# Patient Record
Sex: Female | Born: 1960
Health system: Southern US, Community
[De-identification: ages and names within clinical notes are randomized; demographics above are authoritative.]

## PROBLEM LIST (undated history)

## (undated) ENCOUNTER — Emergency Department (HOSPITAL_COMMUNITY): Admission: EM | Payer: 59 | Source: Home / Self Care

## (undated) ENCOUNTER — Emergency Department (HOSPITAL_COMMUNITY): Admission: EM | Payer: Medicare Other | Source: Home / Self Care

## (undated) DIAGNOSIS — K52832 Lymphocytic colitis: Secondary | ICD-10-CM

## (undated) DIAGNOSIS — M3500 Sicca syndrome, unspecified: Secondary | ICD-10-CM

## (undated) DIAGNOSIS — I493 Ventricular premature depolarization: Secondary | ICD-10-CM

## (undated) DIAGNOSIS — F32A Depression, unspecified: Secondary | ICD-10-CM

## (undated) DIAGNOSIS — F419 Anxiety disorder, unspecified: Secondary | ICD-10-CM

## (undated) DIAGNOSIS — H269 Unspecified cataract: Secondary | ICD-10-CM

## (undated) DIAGNOSIS — K219 Gastro-esophageal reflux disease without esophagitis: Secondary | ICD-10-CM

## (undated) DIAGNOSIS — R42 Dizziness and giddiness: Secondary | ICD-10-CM

## (undated) DIAGNOSIS — M1990 Unspecified osteoarthritis, unspecified site: Secondary | ICD-10-CM

## (undated) DIAGNOSIS — R112 Nausea with vomiting, unspecified: Secondary | ICD-10-CM

## (undated) DIAGNOSIS — K589 Irritable bowel syndrome without diarrhea: Secondary | ICD-10-CM

## (undated) DIAGNOSIS — K648 Other hemorrhoids: Secondary | ICD-10-CM

## (undated) DIAGNOSIS — F329 Major depressive disorder, single episode, unspecified: Secondary | ICD-10-CM

## (undated) DIAGNOSIS — D649 Anemia, unspecified: Secondary | ICD-10-CM

## (undated) DIAGNOSIS — E039 Hypothyroidism, unspecified: Secondary | ICD-10-CM

## (undated) DIAGNOSIS — M797 Fibromyalgia: Secondary | ICD-10-CM

## (undated) DIAGNOSIS — G2581 Restless legs syndrome: Secondary | ICD-10-CM

## (undated) DIAGNOSIS — J301 Allergic rhinitis due to pollen: Secondary | ICD-10-CM

## (undated) DIAGNOSIS — Z9889 Other specified postprocedural states: Secondary | ICD-10-CM

## (undated) DIAGNOSIS — I428 Other cardiomyopathies: Secondary | ICD-10-CM

## (undated) DIAGNOSIS — R5382 Chronic fatigue, unspecified: Secondary | ICD-10-CM

## (undated) DIAGNOSIS — I5042 Chronic combined systolic (congestive) and diastolic (congestive) heart failure: Secondary | ICD-10-CM

## (undated) DIAGNOSIS — Z8271 Family history of polycystic kidney: Secondary | ICD-10-CM

## (undated) DIAGNOSIS — I10 Essential (primary) hypertension: Secondary | ICD-10-CM

## (undated) DIAGNOSIS — W19XXXA Unspecified fall, initial encounter: Secondary | ICD-10-CM

## (undated) HISTORY — DX: Morbid (severe) obesity due to excess calories: E66.01

## (undated) HISTORY — DX: Unspecified cataract: H26.9

## (undated) HISTORY — DX: Restless legs syndrome: G25.81

## (undated) HISTORY — DX: Irritable bowel syndrome, unspecified: K58.9

## (undated) HISTORY — DX: Ventricular premature depolarization: I49.3

## (undated) HISTORY — DX: Other cardiomyopathies: I42.8

## (undated) HISTORY — DX: Major depressive disorder, single episode, unspecified: F32.9

## (undated) HISTORY — DX: Gastro-esophageal reflux disease without esophagitis: K21.9

## (undated) HISTORY — DX: Unspecified fall, initial encounter: W19.XXXA

## (undated) HISTORY — DX: Allergic rhinitis due to pollen: J30.1

## (undated) HISTORY — DX: Anemia, unspecified: D64.9

## (undated) HISTORY — DX: Family history of polycystic kidney: Z82.71

## (undated) HISTORY — PX: ABDOMINAL HYSTERECTOMY: SHX81

## (undated) HISTORY — DX: Chronic combined systolic (congestive) and diastolic (congestive) heart failure: I50.42

## (undated) HISTORY — DX: Essential (primary) hypertension: I10

## (undated) HISTORY — DX: Unspecified osteoarthritis, unspecified site: M19.90

## (undated) HISTORY — DX: Sjogren syndrome, unspecified: M35.00

## (undated) HISTORY — DX: Sicca syndrome, unspecified: M35.00

## (undated) HISTORY — DX: Nausea with vomiting, unspecified: Z98.890

## (undated) HISTORY — PX: EYE SURGERY: SHX253

## (undated) HISTORY — DX: Anxiety disorder, unspecified: F41.9

## (undated) HISTORY — DX: Nausea with vomiting, unspecified: R11.2

## (undated) HISTORY — DX: Hypothyroidism, unspecified: E03.9

## (undated) HISTORY — PX: ESOPHAGOGASTRODUODENOSCOPY: SHX1529

## (undated) HISTORY — DX: Other hemorrhoids: K64.8

## (undated) HISTORY — DX: Dizziness and giddiness: R42

## (undated) HISTORY — DX: Lymphocytic colitis: K52.832

## (undated) HISTORY — DX: Chronic fatigue, unspecified: R53.82

## (undated) HISTORY — PX: ABDOMINAL HERNIA REPAIR: SHX539

## (undated) HISTORY — PX: SPINE SURGERY: SHX786

## (undated) HISTORY — PX: SHOULDER ARTHROSCOPY W/ SUBACROMIAL DECOMPRESSION AND DISTAL CLAVICLE EXCISION: SHX2401

## (undated) HISTORY — DX: Fibromyalgia: M79.7

## (undated) HISTORY — DX: Depression, unspecified: F32.A

---

## 1990-05-06 HISTORY — PX: TUBAL LIGATION: SHX77

## 1995-05-07 DIAGNOSIS — R87619 Unspecified abnormal cytological findings in specimens from cervix uteri: Secondary | ICD-10-CM | POA: Insufficient documentation

## 1996-06-15 ENCOUNTER — Encounter: Payer: Self-pay | Admitting: Family Medicine

## 1997-12-07 ENCOUNTER — Inpatient Hospital Stay (HOSPITAL_COMMUNITY): Admission: EM | Admit: 1997-12-07 | Discharge: 1997-12-08 | Payer: Self-pay | Admitting: *Deleted

## 2000-02-05 ENCOUNTER — Encounter: Admission: RE | Admit: 2000-02-05 | Discharge: 2000-02-05 | Payer: Self-pay | Admitting: Obstetrics and Gynecology

## 2000-02-05 ENCOUNTER — Encounter: Payer: Self-pay | Admitting: Obstetrics and Gynecology

## 2000-02-06 ENCOUNTER — Encounter: Payer: Self-pay | Admitting: Obstetrics and Gynecology

## 2000-02-06 ENCOUNTER — Ambulatory Visit (HOSPITAL_COMMUNITY): Admission: RE | Admit: 2000-02-06 | Discharge: 2000-02-06 | Payer: Self-pay | Admitting: Radiology

## 2000-10-07 ENCOUNTER — Encounter: Payer: Self-pay | Admitting: Obstetrics and Gynecology

## 2000-10-07 ENCOUNTER — Ambulatory Visit (HOSPITAL_COMMUNITY): Admission: RE | Admit: 2000-10-07 | Discharge: 2000-10-07 | Payer: Self-pay | Admitting: Obstetrics and Gynecology

## 2000-10-13 ENCOUNTER — Encounter: Payer: Self-pay | Admitting: Family Medicine

## 2000-10-28 ENCOUNTER — Encounter: Payer: Self-pay | Admitting: Family Medicine

## 2000-11-04 ENCOUNTER — Encounter: Payer: Self-pay | Admitting: Family Medicine

## 2001-01-28 ENCOUNTER — Encounter: Payer: Self-pay | Admitting: Family Medicine

## 2001-03-18 ENCOUNTER — Other Ambulatory Visit: Admission: RE | Admit: 2001-03-18 | Discharge: 2001-03-18 | Payer: Self-pay | Admitting: Obstetrics and Gynecology

## 2003-04-06 ENCOUNTER — Other Ambulatory Visit: Admission: RE | Admit: 2003-04-06 | Discharge: 2003-04-06 | Payer: Self-pay | Admitting: Obstetrics and Gynecology

## 2003-05-24 ENCOUNTER — Encounter: Admission: RE | Admit: 2003-05-24 | Discharge: 2003-05-24 | Payer: Self-pay | Admitting: Obstetrics and Gynecology

## 2003-05-27 ENCOUNTER — Encounter: Admission: RE | Admit: 2003-05-27 | Discharge: 2003-05-27 | Payer: Self-pay | Admitting: Obstetrics and Gynecology

## 2003-08-09 ENCOUNTER — Encounter: Payer: Self-pay | Admitting: Family Medicine

## 2003-10-07 ENCOUNTER — Encounter: Admission: RE | Admit: 2003-10-07 | Discharge: 2003-10-07 | Payer: Self-pay | Admitting: Family Medicine

## 2003-12-13 ENCOUNTER — Encounter: Payer: Self-pay | Admitting: Family Medicine

## 2003-12-30 ENCOUNTER — Encounter: Payer: Self-pay | Admitting: Family Medicine

## 2003-12-30 ENCOUNTER — Ambulatory Visit (HOSPITAL_COMMUNITY): Admission: RE | Admit: 2003-12-30 | Discharge: 2003-12-30 | Payer: Self-pay | Admitting: Internal Medicine

## 2004-05-10 ENCOUNTER — Ambulatory Visit: Payer: Self-pay | Admitting: Internal Medicine

## 2004-06-25 ENCOUNTER — Encounter: Admission: RE | Admit: 2004-06-25 | Discharge: 2004-06-25 | Payer: Self-pay | Admitting: Family Medicine

## 2004-08-02 ENCOUNTER — Encounter: Admission: RE | Admit: 2004-08-02 | Discharge: 2004-08-02 | Payer: Self-pay | Admitting: Allergy and Immunology

## 2004-08-08 ENCOUNTER — Other Ambulatory Visit: Admission: RE | Admit: 2004-08-08 | Discharge: 2004-08-08 | Payer: Self-pay | Admitting: Obstetrics and Gynecology

## 2005-06-15 ENCOUNTER — Emergency Department (HOSPITAL_COMMUNITY): Admission: EM | Admit: 2005-06-15 | Discharge: 2005-06-15 | Payer: Self-pay | Admitting: Family Medicine

## 2005-08-31 ENCOUNTER — Encounter: Payer: Self-pay | Admitting: Family Medicine

## 2005-08-31 ENCOUNTER — Encounter: Admission: RE | Admit: 2005-08-31 | Discharge: 2005-08-31 | Payer: Self-pay | Admitting: Family Medicine

## 2006-01-28 ENCOUNTER — Emergency Department (HOSPITAL_COMMUNITY): Admission: EM | Admit: 2006-01-28 | Discharge: 2006-01-28 | Payer: Self-pay | Admitting: Family Medicine

## 2006-03-12 ENCOUNTER — Other Ambulatory Visit: Admission: RE | Admit: 2006-03-12 | Discharge: 2006-03-12 | Payer: Self-pay | Admitting: Obstetrics and Gynecology

## 2006-03-20 ENCOUNTER — Encounter: Admission: RE | Admit: 2006-03-20 | Discharge: 2006-03-20 | Payer: Self-pay | Admitting: Obstetrics and Gynecology

## 2006-06-06 ENCOUNTER — Ambulatory Visit (HOSPITAL_COMMUNITY): Admission: RE | Admit: 2006-06-06 | Discharge: 2006-06-07 | Payer: Self-pay | Admitting: Obstetrics and Gynecology

## 2006-06-06 ENCOUNTER — Encounter (INDEPENDENT_AMBULATORY_CARE_PROVIDER_SITE_OTHER): Payer: Self-pay | Admitting: Specialist

## 2006-07-21 ENCOUNTER — Encounter: Admission: RE | Admit: 2006-07-21 | Discharge: 2006-07-21 | Payer: Self-pay | Admitting: Family Medicine

## 2006-08-23 ENCOUNTER — Emergency Department (HOSPITAL_COMMUNITY): Admission: EM | Admit: 2006-08-23 | Discharge: 2006-08-24 | Payer: Self-pay | Admitting: Emergency Medicine

## 2006-09-19 ENCOUNTER — Encounter: Payer: Self-pay | Admitting: Family Medicine

## 2006-09-23 ENCOUNTER — Encounter: Admission: RE | Admit: 2006-09-23 | Discharge: 2006-09-23 | Payer: Self-pay | Admitting: Family Medicine

## 2006-09-23 ENCOUNTER — Encounter: Payer: Self-pay | Admitting: Family Medicine

## 2006-10-02 ENCOUNTER — Encounter: Payer: Self-pay | Admitting: Family Medicine

## 2006-10-02 ENCOUNTER — Encounter: Admission: RE | Admit: 2006-10-02 | Discharge: 2006-10-02 | Payer: Self-pay | Admitting: Family Medicine

## 2006-10-14 ENCOUNTER — Encounter: Payer: Self-pay | Admitting: Family Medicine

## 2006-10-29 ENCOUNTER — Encounter: Payer: Self-pay | Admitting: Family Medicine

## 2006-10-31 ENCOUNTER — Encounter: Payer: Self-pay | Admitting: Family Medicine

## 2007-03-24 ENCOUNTER — Encounter: Admission: RE | Admit: 2007-03-24 | Discharge: 2007-03-24 | Payer: Self-pay | Admitting: Family Medicine

## 2007-11-19 ENCOUNTER — Encounter: Payer: Self-pay | Admitting: Family Medicine

## 2007-11-25 ENCOUNTER — Encounter: Payer: Self-pay | Admitting: Family Medicine

## 2008-06-05 ENCOUNTER — Emergency Department (HOSPITAL_COMMUNITY): Admission: EM | Admit: 2008-06-05 | Discharge: 2008-06-05 | Payer: Self-pay | Admitting: Family Medicine

## 2008-08-30 ENCOUNTER — Ambulatory Visit: Payer: Self-pay | Admitting: Family Medicine

## 2008-08-30 DIAGNOSIS — E039 Hypothyroidism, unspecified: Secondary | ICD-10-CM | POA: Insufficient documentation

## 2008-08-30 DIAGNOSIS — G43109 Migraine with aura, not intractable, without status migrainosus: Secondary | ICD-10-CM | POA: Insufficient documentation

## 2008-08-31 DIAGNOSIS — I10 Essential (primary) hypertension: Secondary | ICD-10-CM | POA: Insufficient documentation

## 2008-08-31 DIAGNOSIS — D649 Anemia, unspecified: Secondary | ICD-10-CM | POA: Insufficient documentation

## 2008-08-31 DIAGNOSIS — R519 Headache, unspecified: Secondary | ICD-10-CM | POA: Insufficient documentation

## 2008-08-31 DIAGNOSIS — J309 Allergic rhinitis, unspecified: Secondary | ICD-10-CM | POA: Insufficient documentation

## 2008-08-31 DIAGNOSIS — K219 Gastro-esophageal reflux disease without esophagitis: Secondary | ICD-10-CM | POA: Insufficient documentation

## 2008-08-31 DIAGNOSIS — M1990 Unspecified osteoarthritis, unspecified site: Secondary | ICD-10-CM

## 2008-08-31 DIAGNOSIS — R51 Headache: Secondary | ICD-10-CM | POA: Insufficient documentation

## 2008-08-31 DIAGNOSIS — J45909 Unspecified asthma, uncomplicated: Secondary | ICD-10-CM

## 2008-08-31 DIAGNOSIS — F418 Other specified anxiety disorders: Secondary | ICD-10-CM | POA: Insufficient documentation

## 2008-08-31 DIAGNOSIS — J4540 Moderate persistent asthma, uncomplicated: Secondary | ICD-10-CM | POA: Insufficient documentation

## 2008-09-06 ENCOUNTER — Encounter (INDEPENDENT_AMBULATORY_CARE_PROVIDER_SITE_OTHER): Payer: Self-pay | Admitting: *Deleted

## 2008-09-06 ENCOUNTER — Telehealth: Payer: Self-pay | Admitting: Family Medicine

## 2008-10-01 ENCOUNTER — Other Ambulatory Visit: Payer: Self-pay | Admitting: Emergency Medicine

## 2008-10-01 ENCOUNTER — Emergency Department (HOSPITAL_COMMUNITY): Admission: EM | Admit: 2008-10-01 | Discharge: 2008-10-01 | Payer: Self-pay | Admitting: Emergency Medicine

## 2008-10-04 HISTORY — PX: CHOLECYSTECTOMY: SHX55

## 2008-10-05 ENCOUNTER — Inpatient Hospital Stay (HOSPITAL_COMMUNITY): Admission: RE | Admit: 2008-10-05 | Discharge: 2008-10-06 | Payer: Self-pay | Admitting: General Surgery

## 2008-10-05 ENCOUNTER — Encounter: Payer: Self-pay | Admitting: Family Medicine

## 2008-10-05 ENCOUNTER — Encounter (INDEPENDENT_AMBULATORY_CARE_PROVIDER_SITE_OTHER): Payer: Self-pay | Admitting: General Surgery

## 2008-10-20 ENCOUNTER — Encounter: Payer: Self-pay | Admitting: Family Medicine

## 2008-11-15 ENCOUNTER — Ambulatory Visit: Payer: Self-pay | Admitting: Family Medicine

## 2008-11-15 DIAGNOSIS — E785 Hyperlipidemia, unspecified: Secondary | ICD-10-CM

## 2008-11-15 LAB — CONVERTED CEMR LAB
ALT: 18 units/L (ref 0–35)
AST: 19 units/L (ref 0–37)
Albumin: 3.3 g/dL — ABNORMAL LOW (ref 3.5–5.2)
Alkaline Phosphatase: 70 units/L (ref 39–117)
BUN: 7 mg/dL (ref 6–23)
Basophils Absolute: 0.1 10*3/uL (ref 0.0–0.1)
Basophils Relative: 1.1 % (ref 0.0–3.0)
Bilirubin, Direct: 0.1 mg/dL (ref 0.0–0.3)
CO2: 25 meq/L (ref 19–32)
Calcium: 8.4 mg/dL (ref 8.4–10.5)
Chloride: 109 meq/L (ref 96–112)
Cholesterol: 156 mg/dL (ref 0–200)
Creatinine, Ser: 0.7 mg/dL (ref 0.4–1.2)
Eosinophils Absolute: 0.4 10*3/uL (ref 0.0–0.7)
Eosinophils Relative: 5.9 % — ABNORMAL HIGH (ref 0.0–5.0)
Free T4: 0.8 ng/dL (ref 0.6–1.6)
GFR calc non Af Amer: 94.63 mL/min (ref >60)
Glucose, Bld: 102 mg/dL — ABNORMAL HIGH (ref 70–99)
HCT: 34.3 % — ABNORMAL LOW (ref 36.0–46.0)
HDL: 41.3 mg/dL (ref >39.00)
Hemoglobin: 11.8 g/dL — ABNORMAL LOW (ref 12.0–15.0)
LDL Cholesterol: 99 mg/dL (ref 0–99)
Lymphocytes Relative: 27.2 % (ref 12.0–46.0)
Lymphs Abs: 1.9 10*3/uL (ref 0.7–4.0)
MCHC: 34.3 g/dL (ref 30.0–36.0)
MCV: 90.1 fL (ref 78.0–100.0)
Monocytes Absolute: 0.3 10*3/uL (ref 0.1–1.0)
Monocytes Relative: 4.9 % (ref 3.0–12.0)
Neutro Abs: 4.3 10*3/uL (ref 1.4–7.7)
Neutrophils Relative %: 60.9 % (ref 43.0–77.0)
Platelets: 224 10*3/uL (ref 150.0–400.0)
Potassium: 4.1 meq/L (ref 3.5–5.1)
RBC: 3.81 M/uL — ABNORMAL LOW (ref 3.87–5.11)
RDW: 13.2 % (ref 11.5–14.6)
Sodium: 139 meq/L (ref 135–145)
TSH: 3.29 microintl units/mL (ref 0.35–5.50)
Total Bilirubin: 0.8 mg/dL (ref 0.3–1.2)
Total CHOL/HDL Ratio: 4
Total Protein: 6.3 g/dL (ref 6.0–8.3)
Triglycerides: 79 mg/dL (ref 0.0–149.0)
VLDL: 15.8 mg/dL (ref 0.0–40.0)
WBC: 7 10*3/uL (ref 4.5–10.5)

## 2008-11-17 ENCOUNTER — Ambulatory Visit: Payer: Self-pay | Admitting: Family Medicine

## 2008-11-21 ENCOUNTER — Encounter (INDEPENDENT_AMBULATORY_CARE_PROVIDER_SITE_OTHER): Payer: Self-pay | Admitting: *Deleted

## 2008-11-21 LAB — CONVERTED CEMR LAB
Ferritin: 25.1 ng/mL (ref 10.0–291.0)
Folate: 8.6 ng/mL
Iron: 41 ug/dL — ABNORMAL LOW (ref 42–145)
Saturation Ratios: 14.1 % — ABNORMAL LOW (ref 20.0–50.0)
Transferrin: 207.7 mg/dL — ABNORMAL LOW (ref 212.0–360.0)
Vitamin B-12: 223 pg/mL (ref 211–911)

## 2008-12-21 ENCOUNTER — Encounter: Admission: RE | Admit: 2008-12-21 | Discharge: 2008-12-21 | Payer: Self-pay | Admitting: Family Medicine

## 2008-12-21 LAB — HM MAMMOGRAPHY

## 2008-12-23 ENCOUNTER — Encounter (INDEPENDENT_AMBULATORY_CARE_PROVIDER_SITE_OTHER): Payer: Self-pay | Admitting: *Deleted

## 2009-04-03 ENCOUNTER — Ambulatory Visit: Payer: Self-pay | Admitting: Family Medicine

## 2009-05-15 ENCOUNTER — Ambulatory Visit: Payer: Self-pay | Admitting: Family Medicine

## 2009-05-19 ENCOUNTER — Ambulatory Visit: Payer: Self-pay | Admitting: Family Medicine

## 2009-05-22 ENCOUNTER — Ambulatory Visit: Payer: Self-pay | Admitting: Family Medicine

## 2009-05-22 DIAGNOSIS — R5383 Other fatigue: Secondary | ICD-10-CM

## 2009-05-22 DIAGNOSIS — R5381 Other malaise: Secondary | ICD-10-CM | POA: Insufficient documentation

## 2009-05-22 LAB — CONVERTED CEMR LAB
Basophils Absolute: 0 10*3/uL (ref 0.0–0.1)
Basophils Relative: 0.4 % (ref 0.0–3.0)
Eosinophils Absolute: 0.5 10*3/uL (ref 0.0–0.7)
Eosinophils Relative: 6.2 % — ABNORMAL HIGH (ref 0.0–5.0)
HCT: 41 % (ref 36.0–46.0)
Hemoglobin: 13.5 g/dL (ref 12.0–15.0)
Lymphocytes Relative: 32.4 % (ref 12.0–46.0)
Lymphs Abs: 2.4 10*3/uL (ref 0.7–4.0)
MCHC: 32.8 g/dL (ref 30.0–36.0)
MCV: 92.9 fL (ref 78.0–100.0)
Monocytes Absolute: 0.3 10*3/uL (ref 0.1–1.0)
Monocytes Relative: 4.4 % (ref 3.0–12.0)
Neutro Abs: 4.3 10*3/uL (ref 1.4–7.7)
Neutrophils Relative %: 56.6 % (ref 43.0–77.0)
Platelets: 269 10*3/uL (ref 150.0–400.0)
RBC: 4.42 M/uL (ref 3.87–5.11)
RDW: 12.7 % (ref 11.5–14.6)
TSH: 4.79 microintl units/mL (ref 0.35–5.50)
WBC: 7.5 10*3/uL (ref 4.5–10.5)

## 2009-05-23 LAB — CONVERTED CEMR LAB: Vit D, 25-Hydroxy: 24 ng/mL — ABNORMAL LOW (ref 30–89)

## 2009-07-24 ENCOUNTER — Ambulatory Visit: Payer: Self-pay | Admitting: Family Medicine

## 2009-10-09 ENCOUNTER — Ambulatory Visit: Payer: Self-pay | Admitting: Family Medicine

## 2009-10-09 DIAGNOSIS — E559 Vitamin D deficiency, unspecified: Secondary | ICD-10-CM | POA: Insufficient documentation

## 2009-10-10 LAB — CONVERTED CEMR LAB
ALT: 18 units/L (ref 0–35)
AST: 16 units/L (ref 0–37)
Albumin: 3.7 g/dL (ref 3.5–5.2)
Alkaline Phosphatase: 84 units/L (ref 39–117)
BUN: 15 mg/dL (ref 6–23)
Basophils Absolute: 0.1 10*3/uL (ref 0.0–0.1)
Basophils Relative: 0.8 % (ref 0.0–3.0)
Bilirubin, Direct: 0.1 mg/dL (ref 0.0–0.3)
CO2: 28 meq/L (ref 19–32)
Calcium: 8.8 mg/dL (ref 8.4–10.5)
Chloride: 106 meq/L (ref 96–112)
Cholesterol: 161 mg/dL (ref 0–200)
Creatinine, Ser: 0.8 mg/dL (ref 0.4–1.2)
Eosinophils Absolute: 0.4 10*3/uL (ref 0.0–0.7)
Eosinophils Relative: 4.7 % (ref 0.0–5.0)
GFR calc non Af Amer: 82 mL/min (ref >60)
Glucose, Bld: 97 mg/dL (ref 70–99)
HCT: 38.4 % (ref 36.0–46.0)
HDL: 41.9 mg/dL (ref >39.00)
Hemoglobin: 13.2 g/dL (ref 12.0–15.0)
LDL Cholesterol: 89 mg/dL (ref 0–99)
Lymphocytes Relative: 30.9 % (ref 12.0–46.0)
Lymphs Abs: 2.5 10*3/uL (ref 0.7–4.0)
MCHC: 34.3 g/dL (ref 30.0–36.0)
MCV: 91 fL (ref 78.0–100.0)
Monocytes Absolute: 0.4 10*3/uL (ref 0.1–1.0)
Monocytes Relative: 4.9 % (ref 3.0–12.0)
Neutro Abs: 4.7 10*3/uL (ref 1.4–7.7)
Neutrophils Relative %: 58.7 % (ref 43.0–77.0)
Platelets: 255 10*3/uL (ref 150.0–400.0)
Potassium: 4.1 meq/L (ref 3.5–5.1)
RBC: 4.22 M/uL (ref 3.87–5.11)
RDW: 12.9 % (ref 11.5–14.6)
Sodium: 141 meq/L (ref 135–145)
TSH: 4.11 microintl units/mL (ref 0.35–5.50)
Total Bilirubin: 0.6 mg/dL (ref 0.3–1.2)
Total CHOL/HDL Ratio: 4
Total Protein: 6.4 g/dL (ref 6.0–8.3)
Triglycerides: 153 mg/dL — ABNORMAL HIGH (ref 0.0–149.0)
VLDL: 30.6 mg/dL (ref 0.0–40.0)
Vit D, 25-Hydroxy: 25 ng/mL — ABNORMAL LOW (ref 30–89)
WBC: 8 10*3/uL (ref 4.5–10.5)

## 2009-11-22 ENCOUNTER — Telehealth: Payer: Self-pay | Admitting: Family Medicine

## 2009-11-23 ENCOUNTER — Encounter: Payer: Self-pay | Admitting: Family Medicine

## 2010-01-29 ENCOUNTER — Telehealth: Payer: Self-pay | Admitting: Family Medicine

## 2010-05-23 ENCOUNTER — Telehealth: Payer: Self-pay | Admitting: Family Medicine

## 2010-05-26 ENCOUNTER — Encounter: Payer: Self-pay | Admitting: Obstetrics and Gynecology

## 2010-06-05 NOTE — Assessment & Plan Note (Signed)
Summary: follow up   Vital Signs:  Patient profile:   50 year old female Height:      63 inches Weight:      218.0 pounds BMI:     38.76 Temp:     98.3 degrees F oral Pulse rate:   80 / minute Pulse rhythm:   regular BP sitting:   110 / 70  (left arm) Cuff size:   large  Vitals Entered By: Benny Lennert CMA Duncan Dull) (October 09, 2009 8:20 AM)  History of Present Illness: Chief complaint follow up  HTN: stable, no c/o  Thyroid: stable, no signs   Vit D def: has d/c her vits  GERD: needs to change from protonix, signs well controlled    Current Problems (verified): 1)  Unspecified Vitamin D Deficiency  (ICD-268.9) 2)  Screening For Lipoid Disorders  (ICD-V77.91) 3)  Encounter For Long-term Use of Other Medications  (ICD-V58.69) 4)  Fatigue  (ICD-780.79) 5)  Other and Unspecified Hyperlipidemia  (ICD-272.4) 6)  Migraine Headache  (ICD-346.90) 7)  Fibromyalgia  (ICD-729.1) 8)  Osteoarthritis  (ICD-715.90) 9)  Hypertension  (ICD-401.9) 10)  Headache  (ICD-784.0) 11)  Gerd  (ICD-530.81) 12)  Depression  (ICD-311) 13)  Asthma  (ICD-493.90) 14)  Anemia-nos  (ICD-285.9) 15)  Allergic Rhinitis  (ICD-477.9) 16)  Hypothyroidism  (ICD-244.9)  Allergies: 1)  ! Sulfa 2)  ! Codeine  Past History:  Past medical, surgical, family and social histories (including risk factors) reviewed, and no changes noted (except as noted below).  Past Medical History: Reviewed history from 12/05/2008 and no changes required. Allergic rhinitis Anemia-NOS FIBROMYALGIA Cardiomyopathy, nonischemic, dx 2000, 40% EF by echo then Asthma Depression GERD Headache Hypertension Hypothyroidism Osteoarthritis Restless Legs Syndrome Chronic Vertigo IBS? Cholecystitis, 10/2008  Former Dr. Andrey Campanile then Dr. Artis Flock patient Dr. Larey Dresser, Podiatrist Ortho = Yisroel Ramming, [place] [facility] Cards = Peter [place] Urologist = Dr. Patsi Sears  Past Surgical History: Reviewed history from 12/05/2008 and  no changes required. Hysterectomy, 06/2006, Bleeding / Anemia, no CA Caesarean section, 1984, 1991 Tubal ligation, 1992 Rotator Cuff, R, ? repair vs. SAD/DCE? Pt, not sure [facility]) Cholecystectomy, 10/2008  EGD, 2005, Gessner, stricture in distal esophagus  Family History: Reviewed history from 08/30/2008 and no changes required. Alcoholism, Drug Addiction: P, GP, others Colon CA: 0 Ovarian/Uterine CA: 0 Breast CA: 0 Lung CA: GP Prostate CA: 0 CAD: 0 CVA: 0 Sudden death < 50: 0 DM: 0 Mental Illness: P, GP Pancreatic CA = P  Family History Diabetes 1st degree relative Family History High cholesterol Family History Hypertension  Social History: Reviewed history from 08/30/2008 and no changes required. 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 CV: No chest pain or SOB GI: No noted N or V Otherwise, pertinent positives and negatives are noted in the HPI.   Physical Exam  General:  GEN: WDWN, NAD, Non-toxic, A & O x 3 HEENT: Atraumatic, Normocephalic. Neck supple. No masses, No LAD. Ears and Nose: No external deformity. CV: RRR, No M/G/R. No JVD. No thrill. No extra heart sounds. PULM: CTA B, no wheezes, crackles, rhonchi. No retractions. No resp. distress. No accessory muscle use. EXTR: No c/c/e NEURO: Normal gait.  PSYCH: Normally interactive. Conversant. Not depressed or anxious appearing.  Calm demeanor.     Impression & Recommendations:  Problem #  1:  HYPERTENSION (ICD-401.9) Assessment Unchanged  The following medications were removed from the medication list:    Diovan Hct 80-12.5 Mg Tabs (Valsartan-hydrochlorothiazide) .Marland Kitchen... 1 by mouth daily Her updated medication list for this problem includes:    Losartan Potassium-hctz 50-12.5 Mg Tabs (Losartan potassium-hctz) .Marland Kitchen... 1 by  mouth daily  Problem # 2:  HYPOTHYROIDISM (ICD-244.9) Assessment: Unchanged  Her updated medication list for this problem includes:    Levothroid 50 Mcg Tabs (Levothyroxine sodium) ..... Once daily by mouth  Orders: TLB-TSH (Thyroid Stimulating Hormone) (84443-TSH)  Problem # 3:  GERD (ICD-530.81) Assessment: Unchanged change for ins to prilosec  Her updated medication list for this problem includes:    Omeprazole 20 Mg Cpdr (Omeprazole) .Marland Kitchen... 1 by mouth daily  Complete Medication List: 1)  Omeprazole 20 Mg Cpdr (Omeprazole) .Marland Kitchen.. 1 by mouth daily 2)  Bupropion Hcl 300 Mg Xr24h-tab (Bupropion hcl) .Marland Kitchen.. 1 by mouth daily 3)  Levothroid 50 Mcg Tabs (Levothyroxine sodium) .... Once daily by mouth 4)  Lyrica 75 Mg Caps (Pregabalin) .... Take one capsule three times a day by mouth as needed 5)  Cyclobenzaprine Hcl 5 Mg Tabs (Cyclobenzaprine hcl) .... As needed 6)  Meloxicam 7.5 Mg Tabs (Meloxicam) .... As needed 7)  Ibuprofen 800 Mg Tabs (Ibuprofen) 8)  Multivitamins Tabs (Multiple vitamin) .... One a day by mouth 9)  Sumatriptan Succinate 50 Mg Tabs (Sumatriptan succinate) .Marland Kitchen.. 1 by mouth as needed headache, may repeat after 2 hours 10)  Losartan Potassium-hctz 50-12.5 Mg Tabs (Losartan potassium-hctz) .Marland Kitchen.. 1 by mouth daily  Other Orders: Venipuncture (14782) TLB-Lipid Panel (80061-LIPID) TLB-BMP (Basic Metabolic Panel-BMET) (80048-METABOL) TLB-CBC Platelet - w/Differential (85025-CBCD) TLB-Hepatic/Liver Function Pnl (80076-HEPATIC) T-Vitamin D (25-Hydroxy) (95621-30865) Prescriptions: OMEPRAZOLE 20 MG CPDR (OMEPRAZOLE) 1 by mouth daily  #30 x 11   Entered and Authorized by:   [name] MD   Signed by:   [name] MD on 10/09/2009   Method used:   Print then Give to Patient   RxID:   [id number] LOSARTAN POTASSIUM-HCTZ 50-12.5 MG TABS (LOSARTAN POTASSIUM-HCTZ) 1 by mouth daily  #30 x 11   Entered and Authorized by:   [name] MD   Signed by:   [name] MD on 10/09/2009   Method used:   Print then Give to Patient   RxID:   [id number]   Current Allergies (reviewed today): ! SULFA ! CODEINE  Appended Document: follow up

## 2010-06-05 NOTE — Letter (Signed)
Summary: Out of Work  [facility] [employer] [facility] at [facility]  [address] [place], [place] [address]   Phone: [phone]  Fax: [phone]    July 24, 2009   Employee:  LAILANA SHIRA [facility] [employer]    To Whom It May Concern:   For Medical reasons, please excuse the above named employee from work for the following dates:  Start:   07/22/2009  End:   07/28/2009  If you need additional information, please feel free to contact our office.         Sincerely,    [name] MD

## 2010-06-05 NOTE — Assessment & Plan Note (Signed)
Summary: NEW PT TO EST/CLE   Vital Signs:  Patient profile:   50 year old female Height:      63 inches Weight:      237.0 pounds BMI:     42.13 Temp:     98.1 degrees F oral Pulse rate:   72 / minute Pulse rhythm:   regular BP sitting:   140 / 90  (left arm) Cuff size:   regular  Vitals Entered By: Benny Lennert CMA (August 30, 2008 2:05 PM)  History of Present Illness: Chief complaint new patient to be established   50 year old female patient, former patient of Dr. Artis Flock who presents to establish in our office:  Was a former patient of Dr. Andrey Campanile, then Dr. Artis Flock.   Having some hacking cough.   Dep: Took some Cymbalta, went to the [facility], placed on Cymbalta. Mentally it was very good but felt very removed and had a paranoid sensation and sweat prfusely. Now she does not feel like she is very stable and maintained on her wellbutrin.  Cardiologist is Dr. Peter Swaziland - having some arrythmias. Every once in a while will have some palpitations. Racing - will have to review Dr. Illa Level notes. Was on 2 medicines for some time. The patient is not clear about a diagnosis or what she has been on - will review Dr. Elvis Coil notes.  HTN: 140/90 today, requests a combination diovan-HCTZ combo pill. Has been tolerating this well without difficulty.  Fibro, stopped meds unless hurting so bad unless she can't stand it.  Is currently only using her Lyrica as needed.  h/o GERD, stable on Protonix  h/o hypothyroidism, stable on replacement.  The patient has some sort of cardiomyopathy vs. CHF? She is unclear and will have to review records.  h/o migraine headaches, relatively controlled now - no recent headaches.   Preventive Screening-Counseling & Management     Does Patient Exercise: no      Drug Use:  no.    Allergies (verified): 1)  ! Sulfa 2)  ! Codeine  Past History:  Past Medical History:    Allergic rhinitis    Anemia-NOS    FIBROMYALGIA    Cardiomyopathy?   Asthma    Depression    GERD    Headache    Hypertension    Hypothyroidism    Osteoarthritis    Restless Legs Syndrome    Chronic Vertigo    IBS?        Former Dr. Andrey Campanile then Dr. 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, Podiatrist    Ortho = Yisroel Ramming, [place] [facility]    Cards = Peter [place]    Urologist = Dr. Patsi Sears  Past Surgical History:    Hysterectomy, 06/2006, Bleeding / Anemia, no CA    Caesarean section, 1984, 1991    Tubal ligation, 1992    Rotator Cuff, R, ? repair vs. SAD/DCE? Pt, not sure Yisroel Ramming)  Family History:    Alcoholism, Drug Addiction: P, GP, others    Colon CA: 0    Ovarian/Uterine CA: 0    Breast CA: 0    Lung CA: GP    Prostate CA: 0    CAD: 0    CVA: 0    Sudden death < 50: 0    DM: 0    Mental Illness: P, GP    Pancreatic CA = P        Family History Diabetes 1st degree relative  Family History High cholesterol    Family History Hypertension  Social History:    Occupation: Biomedical scientist w/ some college    Former smoker, quit  ~1990    Alcohol use-no    Drug use-no    Regular exercise-no    Drug Use:  no    Does Patient Exercise:  no  Review of Systems      See HPI       Otherwise, the pertinent positives and negatives are listed above and in the HPI, otherwise a full review of systems has been reviewed and is negative unless noted positive.  General:  Complains of fatigue, malaise, sleep disorder, and weakness; denies chills, fever, loss of appetite, and sweats. CV:  Complains of fatigue and palpitations; denies chest pain or discomfort and fainting. Resp:  Complains of cough; denies coughing up blood, excessive snoring, shortness of breath, sputum productive, and wheezing. GI:  Denies indigestion, loss of appetite, nausea, and vomiting. MS:  Complains of joint pain, loss of strength, low back pain, mid back pain, muscle aches, muscle, cramps, muscle weakness, stiffness, and thoracic pain; denies joint redness and  joint swelling. Neuro:  Denies numbness and tingling. Psych:  Complains of anxiety, depression, easily tearful, irritability, mental problems, and panic attacks; denies alternate hallucination ( auditory/visual), easily angered, sense of great danger, suicidal thoughts/plans, thoughts of violence, unusual visions or sounds, and thoughts /plans of harming others. Endo:  Denies cold intolerance, excessive hunger, excessive thirst, excessive urination, heat intolerance, and polyuria.  Physical Exam  General:  alert, well-developed, well-hydrated, appropriate dress, normal appearance, cooperative to examination, good hygiene, overweight-appearing, and pale.   Head:  normocephalic, atraumatic, no abnormalities observed, no abnormalities palpated, and no alopecia.   Eyes:  vision grossly intact, pupils equal, pupils round, pupils reactive to light, pupils react to accomodation, corneas and lenses clear, and no injection.   Ears:  External ear exam shows no significant lesions or deformities.  Otoscopic examination reveals clear canals, tympanic membranes are intact bilaterally without bulging, retraction, inflammation or discharge. Hearing is grossly normal bilaterally. Nose:  no external deformity.   Mouth:  Oral mucosa and oropharynx without lesions or exudates.  Teeth in good repair. Neck:  No deformities, masses, or tenderness noted. Lungs:  Normal respiratory effort, chest expands symmetrically. Lungs are clear to auscultation, no crackles or wheezes. Heart:  Normal rate and regular rhythm. S1 and S2 normal without gallop, murmur, click, rub or other extra sounds. Abdomen:  Bowel sounds positive,abdomen soft and non-tender without masses, organomegaly or hernias noted. Msk:  normal ROM and no redness over joints.   Extremities:  No clubbing, cyanosis, edema, or deformity noted with normal full range of motion of all joints.   Neurologic:  alert & oriented X3, sensation intact to light touch, and  gait normal.   Skin:  Intact without suspicious lesions or rashes Cervical Nodes:  No lymphadenopathy noted Psych:  Oriented X3, memory intact for recent and remote, normally interactive, good eye contact, not agitated, depressed affect, tearful, and slightly anxious.     Impression & Recommendations:  Problem # 1:  HYPERTENSION (ICD-401.9) Assessment New Anxious and distressed today - may have to increase at follow-up  The following medications were removed from the medication list:    Hydrochlorothiazide 12.5 Mg Caps (Hydrochlorothiazide) ..... Once daily    Diovan Hct 80-12.5 Mg Tabs (Valsartan-hydrochlorothiazide) ..... Once daily Her updated medication list for this problem includes:    Diovan Hct  80-12.5 Mg Tabs (Valsartan-hydrochlorothiazide) .Marland Kitchen... 1 by mouth daily  Problem # 2:  DEPRESSION (ICD-311) Assessment: New Poorly controlled - will need to explore further.  at the time of this note, I have already seen her husband who discussed some of his concerns to me, he does not think she is well controlled from a depression standpoint.  Her updated medication list for this problem includes:    Bupropion Hcl 150 Mg Xr12h-tab (Bupropion hcl) ..... Once daily  Problem # 3:  GERD (ICD-530.81) Assessment: New stable  Her updated medication list for this problem includes:    Pantoprazole Sodium 40 Mg Tbec (Pantoprazole sodium) ..... Once daily  Problem # 4:  HYPOTHYROIDISM (ICD-244.9) stable  Her updated medication list for this problem includes:    Levothroid 50 Mcg Tabs (Levothyroxine sodium) ..... Once daily  Problem # 5:  FIBROMYALGIA (ICD-729.1) Assessment: New Increase Lyrica dosing.  Her updated medication list for this problem includes:    Cyclobenzaprine Hcl 5 Mg Tabs (Cyclobenzaprine hcl) .Marland Kitchen... As needed    Meloxicam 7.5 Mg Tabs (Meloxicam) .Marland Kitchen... As needed    Ibuprofen 800 Mg Tabs (Ibuprofen)  Problem # 6:  MIGRAINE HEADACHE (ICD-346.90) Assessment:  New Given emergency Imitrex.  Her updated medication list for this problem includes:    Meloxicam 7.5 Mg Tabs (Meloxicam) .Marland Kitchen... As needed    Ibuprofen 800 Mg Tabs (Ibuprofen)    Sumatriptan Succinate 50 Mg Tabs (Sumatriptan succinate) .Marland Kitchen... 1 by mouth as needed headache, may repeat after 2 hours  Complete Medication List: 1)  Pantoprazole Sodium 40 Mg Tbec (Pantoprazole sodium) .... Once daily 2)  Bupropion Hcl 150 Mg Xr12h-tab (Bupropion hcl) .... Once daily 3)  Levothroid 50 Mcg Tabs (Levothyroxine sodium) .... Once daily 4)  Lyrica 75 Mg Caps (Pregabalin) .... As needed 5)  Cyclobenzaprine Hcl 5 Mg Tabs (Cyclobenzaprine hcl) .... As needed 6)  Meloxicam 7.5 Mg Tabs (Meloxicam) .... As needed 7)  Ibuprofen 800 Mg Tabs (Ibuprofen) 8)  Multivitamins Tabs (Multiple vitamin) .... One a day 9)  Diovan Hct 80-12.5 Mg Tabs (Valsartan-hydrochlorothiazide) .Marland Kitchen.. 1 by mouth daily 10)  Sumatriptan Succinate 50 Mg Tabs (Sumatriptan succinate) .Marland Kitchen.. 1 by mouth as needed headache, may repeat after 2 hours  Patient Instructions: 1)  follow-up in July Prescriptions: SUMATRIPTAN SUCCINATE 50 MG TABS (SUMATRIPTAN SUCCINATE) 1 by mouth as needed headache, may repeat after 2 hours  #10 x 11   Entered and Authorized by:   [name] MD   Signed by:   [name] MD on 08/30/2008   Method used:   Print then Give to Patient   RxID:   [id number] DIOVAN HCT 80-12.5 MG TABS (VALSARTAN-HYDROCHLOROTHIAZIDE) 1 by mouth daily  #30 x 11   Entered and Authorized by:   [name] MD   Signed by:   [name] MD on 08/30/2008   Method used:   Print then Give to Patient   RxID:   [id number]       Current Allergies (reviewed today): ! SULFA ! CODEINE

## 2010-06-05 NOTE — Assessment & Plan Note (Signed)
Summary: FOLLOW UP / LFW   Vital Signs:  Patient profile:   50 year old female Height:      63 inches Weight:      222.6 pounds BMI:     39.57 Temp:     98.2 degrees F oral Pulse rate:   80 / minute Pulse rhythm:   regular BP sitting:   120 / 78  (left arm) Cuff size:   large  Vitals Entered By: Benny Lennert CMA Duncan Dull) (May 22, 2009 8:09 AM)  History of Present Illness: Chief complaint 6 month follow up  f/u  thyroid: hair was falling out some in october. used a natural fiber hair brush. a little more.   iron def anemia - has not been able to take iron pills. David constipating for her, and she has not been able to tolerate taking  BP stable: Compliant with medications  uri 2 weeks was working twelve hours through the wekeend each eday she is finished taking her azithromycin, covered for potential bacterial infection, and is resolving at this point.  vit d: diffuse aches and pains and arthralgias and history of fibromyalgia, no recent vitamin D checked  Review of systems as above. No fever, chills, sweats. No nausea, vomiting, diarrhea. No chest pain shortness of breath  GEN: WDWN, NAD, Non-toxic, A & O x 3 HEENT: Atraumatic, Normocephalic. Neck supple. No masses, No LAD. Ears and Nose: No external deformity. CV: RRR, No M/G/R. No JVD. No thrill. No extra heart sounds. PULM: CTA B, no wheezes, crackles, rhonchi. No retractions. No resp. distress. No accessory muscle use. EXTR: No c/c/e NEURO: Normal gait.  PSYCH: Normally interactive. Conversant. Not depressed or anxious appearing.  Calm demeanor.     Allergies: 1)  ! Sulfa 2)  ! Codeine  Past History:  Past medical, surgical, family and social histories (including risk factors) reviewed, and no changes noted (except as noted below).  Past Medical History: Reviewed history from 12/05/2008 and no changes required. Allergic rhinitis Anemia-NOS FIBROMYALGIA Cardiomyopathy, nonischemic, dx 2000, 40%  EF by echo then Asthma Depression GERD Headache Hypertension Hypothyroidism Osteoarthritis Restless Legs Syndrome Chronic Vertigo IBS? Cholecystitis, 10/2008  Former Dr. Andrey Campanile then Dr. Artis Flock patient Dr. Larey Dresser, Podiatrist Ortho = Yisroel Ramming, [place] [facility] Cards = Peter [place] Urologist = Dr. Patsi Sears  Past Surgical History: Reviewed history from 12/05/2008 and no changes required. Hysterectomy, 06/2006, Bleeding / Anemia, no CA Caesarean section, 1984, 1991 Tubal ligation, 1992 Rotator Cuff, R, ? repair vs. SAD/DCE? Pt, not sure [facility]) Cholecystectomy, 10/2008  EGD, 2005, Gessner, stricture in distal esophagus  Family History: Reviewed history from 08/30/2008 and no changes required. Alcoholism, Drug Addiction: P, GP, others Colon CA: 0 Ovarian/Uterine CA: 0 Breast CA: 0 Lung CA: GP Prostate CA: 0 CAD: 0 CVA: 0 Sudden death < 50: 0 DM: 0 Mental Illness: P, GP Pancreatic CA = P  Family History Diabetes 1st degree relative Family History High cholesterol Family History Hypertension  Social History: Reviewed history from 08/30/2008 and no changes required. 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 1 by mouth daily  BP today: 120/78 Prior BP: 116/80 (05/19/2009)  Labs Reviewed: K+: 4.1 (11/15/2008) Creat: : 0.7 (11/15/2008)   Chol: 156 (11/15/2008)  HDL: 41.30 (11/15/2008)   LDL: 99 (11/15/2008)   TG: 79.0 (11/15/2008)  Problem # 2:  HYPOTHYROIDISM (ICD-244.9)  Her updated medication list for this problem includes:    Levothroid 50 Mcg Tabs (Levothyroxine sodium) ..... Once daily by mouth  Orders: Venipuncture (16109) TLB-TSH (Thyroid  Stimulating Hormone) (84443-TSH)  Problem # 3:  FATIGUE (ICD-780.79) Assessment: New  Orders: Venipuncture (60454) T-Vitamin D (25-Hydroxy) (09811-91478) Specimen Handling (29562)  Problem # 4:  ANEMIA-NOS (ICD-285.9) iron def, intol to meds, recheck cbc  Orders: Venipuncture (13086) TLB-CBC Platelet - w/Differential (85025-CBCD)  Complete Medication List: 1)  Protonix 40 Mg Tbec (Pantoprazole sodium) .Marland Kitchen.. 1 by mouth daily (failed prilosec, prevacid, nexium) 2)  Bupropion Hcl 300 Mg Xr24h-tab (Bupropion hcl) .Marland Kitchen.. 1 by mouth daily 3)  Levothroid 50 Mcg Tabs (Levothyroxine sodium) .... Once daily by mouth 4)  Lyrica 75 Mg Caps (Pregabalin) .... Take one capsule three times a day by mouth as needed 5)  Cyclobenzaprine Hcl 5 Mg Tabs (Cyclobenzaprine hcl) .... As needed 6)  Meloxicam 7.5 Mg Tabs (Meloxicam) .... As needed 7)  Ibuprofen 800 Mg Tabs (Ibuprofen) 8)  Multivitamins Tabs (Multiple vitamin) .... One a day by mouth 9)  Diovan Hct 80-12.5 Mg Tabs (Valsartan-hydrochlorothiazide) .Marland Kitchen.. 1 by mouth daily 10)  Sumatriptan Succinate 50 Mg Tabs (Sumatriptan succinate) .Marland Kitchen.. 1 by mouth as needed headache, may repeat after 2 hours  Patient Instructions: 1)  f/u 6 months  Current Allergies (reviewed today): ! SULFA ! CODEINE

## 2010-06-05 NOTE — Letter (Signed)
Summary: Generic Letter  [place] [facility]  [address] [place], [place] [address]   Phone: [phone]  Fax: [phone]    12/23/2008     Kristine Curtis [address] [place], [place]  [address]    Dear Ms. [facility],    I believe that Dr. Jean Rosenthal would have discussed with you but, multiple cysts found on mammogram and u/s at screening, felt to be benign, but she needs to have repeat mammogram in 1 year.        Sincerely,   [name] MD

## 2010-06-05 NOTE — Assessment & Plan Note (Signed)
Summary: FLU???/RBH   Vital Signs:  Patient profile:   50 year old female Height:      63 inches Weight:      225.50 pounds BMI:     40.09 Temp:     98.4 degrees F oral Pulse rate:   88 / minute Pulse rhythm:   regular BP sitting:   110 / 68  (left arm) Cuff size:   large  Vitals Entered By: Delilah Shan CMA Duncan Dull) (May 15, 2009 3:01 PM) CC: ? Flu   History of Present Illness: 50 yo with 1 week of worsening URI symptoms. Nasal congesiton, dry cough, fever (Tmax 100), chills. Scratchy throat and ear pain. No n/v/d. No rashes. She is sneezig a lot. Using Tylenol PM at night.  Son has similar symptoms.  Current Medications (verified): 1)  Protonix 40 Mg Tbec (Pantoprazole Sodium) .Marland Kitchen.. 1 By Mouth Daily (Failed Prilosec, Prevacid, Nexium) 2)  Bupropion Hcl 300 Mg Xr24h-Tab (Bupropion Hcl) .Marland Kitchen.. 1 By Mouth Daily 3)  Levothroid 50 Mcg Tabs (Levothyroxine Sodium) .... Once Daily By Mouth 4)  Lyrica 75 Mg Caps (Pregabalin) .... Take One Capsule Three Times A Day By Mouth As Needed 5)  Cyclobenzaprine Hcl 5 Mg Tabs (Cyclobenzaprine Hcl) .... As Needed 6)  Meloxicam 7.5 Mg Tabs (Meloxicam) .... As Needed 7)  Ibuprofen 800 Mg Tabs (Ibuprofen) 8)  Multivitamins   Tabs (Multiple Vitamin) .... One A Day By Mouth 9)  Diovan Hct 80-12.5 Mg Tabs (Valsartan-Hydrochlorothiazide) .Marland Kitchen.. 1 By Mouth Daily 10)  Sumatriptan Succinate 50 Mg Tabs (Sumatriptan Succinate) .Marland Kitchen.. 1 By Mouth As Needed Headache, May Repeat After 2 Hours 11)  Azithromycin 250 Mg  Tabs (Azithromycin) .... 2 By  Mouth Today and Then 1 Daily For 4 Days  Allergies: 1)  ! Sulfa 2)  ! Codeine  Review of Systems      See HPI General:  Complains of chills and fever. ENT:  Complains of nasal congestion, sinus pressure, and sore throat. CV:  Denies chest pain or discomfort. Resp:  Complains of cough; denies shortness of breath, sputum productive, and wheezing.  Physical Exam  General:  alert, well-developed,  well-hydrated, appropriate dress, normal appearance, cooperative to examination, good hygiene, overweight-appearing, and pale.   Ears:  clear fluid bilateral TMs. Nose:  nasal dischargemucosal pallor.   pos ethmoid sinuses Mouth:  Oral mucosa and oropharynx without lesions or exudates.  Teeth in good repair. Lungs:  Normal respiratory effort, chest expands symmetrically. Lungs are clear to auscultation, no crackles or wheezes. Heart:  Normal rate and regular rhythm. S1 and S2 normal without gallop, murmur, click, rub or other extra sounds. Skin:  Intact without suspicious lesions or rashes Psych:  Oriented X3, memory intact for recent and remote, normally interactive, good eye contact, not agitated, depressed affect, tearful, and slightly anxious.     Impression & Recommendations:  Problem # 1:  ACUTE ETHMOIDAL SINUSITIS (ICD-461.2) Assessment New Given duration symptoms, will treat with abx. Stated that she cannot tolerate augmentin. try Zpack. Continue supportive care, see patient instructions for details. Reassured her that this did not sound lke influenza. Her updated medication list for this problem includes:    Azithromycin 250 Mg Tabs (Azithromycin) .Marland Kitchen... 2 by  mouth today and then 1 daily for 4 days  Complete Medication List: 1)  Protonix 40 Mg Tbec (Pantoprazole sodium) .Marland Kitchen.. 1 by mouth daily (failed prilosec, prevacid, nexium) 2)  Bupropion Hcl 300 Mg Xr24h-tab (Bupropion hcl) .Marland Kitchen.. 1 by mouth daily 3)  Levothroid  50 Mcg Tabs (Levothyroxine sodium) .... Once daily by mouth 4)  Lyrica 75 Mg Caps (Pregabalin) .... Take one capsule three times a day by mouth as needed 5)  Cyclobenzaprine Hcl 5 Mg Tabs (Cyclobenzaprine hcl) .... As needed 6)  Meloxicam 7.5 Mg Tabs (Meloxicam) .... As needed 7)  Ibuprofen 800 Mg Tabs (Ibuprofen) 8)  Multivitamins Tabs (Multiple vitamin) .... One a day by mouth 9)  Diovan Hct 80-12.5 Mg Tabs (Valsartan-hydrochlorothiazide) .Marland Kitchen.. 1 by mouth daily 10)   Sumatriptan Succinate 50 Mg Tabs (Sumatriptan succinate) .Marland Kitchen.. 1 by mouth as needed headache, may repeat after 2 hours 11)  Azithromycin 250 Mg Tabs (Azithromycin) .... 2 by  mouth today and then 1 daily for 4 days  Patient Instructions: 1)  Zpack at your pharmacy. 2)   Treat sympotmatically  with guafenesin, nasal saline irrigation, and Tylenol.  Cough suppressant at night. Call if not improving as expected in 3-5 days. Prescriptions: AZITHROMYCIN 250 MG  TABS (AZITHROMYCIN) 2 by  mouth today and then 1 daily for 4 days  #6 x 0   Entered and Authorized by:   Ruthe Mannan MD   Signed by:   Ruthe Mannan MD on 05/15/2009   Method used:   [facility]        [facility]  [address] (retail)       [address]       [place], [place]  [address]       Ph: [phone] or [phone]       Fax: [phone]   RxID:   [id number]   Current Allergies (reviewed today): ! SULFA ! CODEINE

## 2010-06-05 NOTE — Assessment & Plan Note (Signed)
Summary: VOMITING/RBH   Vital Signs:  Patient profile:   50 year old female Height:      63 inches Weight:      222.0 pounds BMI:     39.47 Temp:     98.2 degrees F oral Pulse rate:   80 / minute Pulse rhythm:   regular BP sitting:   110 / 70  (left arm) Cuff size:   large  Vitals Entered By: Benny Lennert CMA Duncan Dull) (July 24, 2009 11:04 AM)  History of Present Illness: Chief complaint vomiting since 2 am on saturday and now also has diarrhea  50 year old female:  Not keeping much of anythig down  n vomitting, throwing up for about eight times a day  having diarrhea, up to 6 times in evening last night.  Watery  started sat no blood, mucous   ROS, no fever, no chills, fatigue, mild abd pain.   GEN: Well-developed,well-nourished,in no acute distress; alert,appropriate and cooperative throughout examination HEENT: Normocephalic and atraumatic without obvious abnormalities. No apparent alopecia or balding. Ears, externally no deformities PULM: Breathing comfortably in no respiratory distress EXT: No clubbing, cyanosis, or edema PSYCH: Normally interactive. Cooperative during the interview. Pleasant. Friendly and conversant. Not anxious or depressed appearing. Normal, full affect.  ABD, S, NT, ND, hyperactive BS  Allergies: 1)  ! Sulfa 2)  ! Codeine  Past History:  Past medical, surgical, family and social histories (including risk factors) reviewed, and no changes noted (except as noted below).  Past Medical History: Reviewed history from 12/05/2008 and no changes required. Allergic rhinitis Anemia-NOS FIBROMYALGIA Cardiomyopathy, nonischemic, dx 2000, 40% EF by echo then Asthma Depression GERD Headache Hypertension Hypothyroidism Osteoarthritis Restless Legs Syndrome Chronic Vertigo IBS? Cholecystitis, 10/2008  Former Dr. Andrey Campanile then Dr. Artis Flock patient Dr. Larey Dresser, Podiatrist Ortho = Yisroel Ramming, [place] [facility] Cards = [place] Urologist  = Dr. Patsi Sears  Past Surgical History: Reviewed history from 12/05/2008 and no changes required. Hysterectomy, 06/2006, Bleeding / Anemia, no CA Caesarean section, 1984, 1991 Tubal ligation, 1992 Rotator Cuff, R, ? repair vs. SAD/DCE? 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 Alcoholism, Drug Addiction: P, GP, others Colon CA: 0 Ovarian/Uterine CA: 0 Breast CA: 0 Lung CA: GP Prostate CA: 0 CAD: 0 CVA: 0 Sudden death < 50: 0 DM: 0 Mental Illness: P, GP Pancreatic CA = P  Family History Diabetes 1st degree relative Family History High cholesterol Family History Hypertension  Social History: Reviewed history from 08/30/2008 and no changes required. 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 1 by mouth daily (failed prilosec, prevacid, nexium) 2)  Bupropion Hcl 300 Mg Xr24h-tab (Bupropion hcl) .Marland Kitchen.. 1 by mouth daily 3)  Levothroid 50 Mcg Tabs (Levothyroxine sodium) .... Once daily by mouth 4)  Lyrica 75 Mg Caps (Pregabalin) .... Take one capsule three times a day by mouth as needed 5)  Cyclobenzaprine Hcl 5 Mg Tabs (Cyclobenzaprine hcl) .... As needed 6)  Meloxicam 7.5 Mg Tabs (Meloxicam) .... As needed 7)  Ibuprofen 800 Mg Tabs (Ibuprofen) 8)  Multivitamins Tabs (Multiple vitamin) .... One a day by mouth 9)  Diovan Hct 80-12.5 Mg Tabs (Valsartan-hydrochlorothiazide) .Marland Kitchen.. 1 by mouth daily 10)  Sumatriptan Succinate 50 Mg Tabs (Sumatriptan succinate) .Marland Kitchen.. 1 by mouth as needed headache, may repeat after 2 hours  Patient Instructions: 1)  Prevent dehydration: drink Gatorade, Pedialyte, Ginger Ale,  popsicles 2)  Immodium A-D over ther counter or Pepto-Bismol  3)  Diet: liquids, advance slowly to bananas, rice, applesauce, grits, toast, baked potato. 4)  No milk, dairy, spicy or fried food. 5)  No alcohol, tobacco, caffeine   Current Allergies (reviewed today): ! SULFA ! CODEINE

## 2010-06-05 NOTE — Procedures (Signed)
Summary: [facility]  EGD/[facility]   Imported By: Lanelle Bal 12/12/2008 13:54:48  _____________________________________________________________________  External Attachment:    Type:   Image     Comment:   External Document

## 2010-06-05 NOTE — Progress Notes (Signed)
Summary: refill request for mobic  Phone Note Refill Request Call back at [phone] Message from:  Patient  Refills Requested: Medication #1:  MELOXICAM 7.5 MG TABS as needed Phoned request from pt, [facility].  She said this was prescribed by her previous doctor, she doesnt remember the name.  Initial call taken by: Lowella Petties CMA,  January 29, 2010 2:39 PM  Follow-up for Phone Call        ok  Call in to the pharmacy of their choice Call in #30, 11 refills. OR if they prefer a 90 day supply, #90 with 3 refills is OK, too Prescription instructions above  Follow-up by: [name] MD,  January 29, 2010 2:44 PM  Additional Follow-up for Phone Call Additional follow up Details #1::        Rx called to pharmacy Additional Follow-up by: Benny Lennert CMA Duncan Dull),  January 29, 2010 2:47 PM    New/Updated Medications: MELOXICAM 7.5 MG TABS (MELOXICAM) take one tablet daily as needed for pain Prescriptions: MELOXICAM 7.5 MG TABS (MELOXICAM) take one tablet daily as needed for pain  #30 x 11   Entered by:   Benny Lennert CMA (AAMA)   Authorized by:   [name] MD   Signed by:   Benny Lennert CMA (AAMA) on 01/29/2010   Method used:   [facility]        [facility]  [address] (retail)       [address]       [place], [place]  [address]       Ph: [phone] or [phone]       Fax: [phone]   RxID:   [id number]

## 2010-06-05 NOTE — Assessment & Plan Note (Signed)
Summary: ROA IN JULY  CYD   Vital Signs:  Patient profile:   50 year old female Height:      63 inches Weight:      228.0 pounds BMI:     40.53 Temp:     98.3 degrees F oral Pulse rate:   72 / minute Pulse rhythm:   regular BP sitting:   110 / 80  (left arm) Cuff size:   regular  Vitals Entered By: Benny Lennert CMA (November 17, 2008 8:05 AM)  History of Present Illness: Chief complaint follow up   Recently had gallbladder out - gangrenous  HTN: essentially stable, and her blood pressure is currently at goal.  Fibro: Relatively stable this point.  Dep: Not completely stable, still with some depressive symptoms, not fully functioning, not crying all day, symptoms described as mild-moderate. No suicidal ideation or homicidal ideation.  Rapid cholecstecomy, recent, cholelithiasis. Doing well currently.  h/o much anemia, had novasure, then had hysterectomy. hemoglobin is 11.8, noted to be anemic, does have a history of iron deficiency anemia in the past.  GERD: stable, needs branded Protonix for insurance  Thyroid levels stable    Clinical Review Panels:  Lipid Management   Cholesterol:  156 (11/15/2008)   LDL (bad choesterol):  99 (11/15/2008)   HDL (good cholesterol):  41.30 (11/15/2008)  CBC   WBC:  7.0 (11/15/2008)   RBC:  3.81 (11/15/2008)   Hgb:  11.8 (11/15/2008)   Hct:  34.3 (11/15/2008)   Platelets:  224.0 (11/15/2008)   MCV  90.1 (11/15/2008)   MCHC  34.3 (11/15/2008)   RDW  13.2 (11/15/2008)   PMN:  60.9 (11/15/2008)   Lymphs:  27.2 (11/15/2008)   Monos:  4.9 (11/15/2008)   Eosinophils:  5.9 (11/15/2008)   Basophil:  1.1 (11/15/2008)  Complete Metabolic Panel   Glucose:  102 (11/15/2008)   Sodium:  139 (11/15/2008)   Potassium:  4.1 (11/15/2008)   Chloride:  109 (11/15/2008)   CO2:  25 (11/15/2008)   BUN:  7 (11/15/2008)   Creatinine:  0.7 (11/15/2008)   Albumin:  3.3 (11/15/2008)   Total Protein:  6.3 (11/15/2008)   Calcium:  8.4  (11/15/2008)   Total Bili:  0.8 (11/15/2008)   Alk Phos:  70 (11/15/2008)   SGPT (ALT):  18 (11/15/2008)   SGOT (AST):  19 (11/15/2008)   Allergies: 1)  ! Sulfa 2)  ! Codeine  Past History:  Past medical, surgical, family and social histories (including risk factors) reviewed, and no changes noted (except as noted below).  Past Medical History: Reviewed history from 10/31/2008 and no changes required. Allergic rhinitis Anemia-NOS FIBROMYALGIA Cardiomyopathy? Asthma Depression GERD Headache Hypertension Hypothyroidism Osteoarthritis Restless Legs Syndrome Chronic Vertigo IBS? Cholecystitis, 10/2008  Former Dr. Andrey Campanile then Dr. Artis Flock patient Dr. Larey Dresser, Podiatrist Ortho = Yisroel Ramming, [place] Ortho Cards = [place] Urologist = Dr. Patsi Sears  Past Surgical History: Reviewed history from 10/31/2008 and no changes required. Hysterectomy, 06/2006, Bleeding / Anemia, no CA Caesarean section, 1984, 1991 Tubal ligation, 1992 Rotator Cuff, R, ? repair vs. SAD/DCE? Pt, not sure Yisroel Ramming) Cholecystectomy, 10/2008  Family History: Reviewed history from 08/30/2008 and no changes required. Alcoholism, Drug Addiction: P, GP, others Colon CA: 0 Ovarian/Uterine CA: 0 Breast CA: 0 Lung CA: GP Prostate CA: 0 CAD: 0 CVA: 0 Sudden death < 50: 0 DM: 0 Mental Illness: P, GP Pancreatic CA = P  Family History Diabetes 1st degree relative Family History High cholesterol Family History Hypertension  Social  History: Reviewed history from 08/30/2008 and no changes required. 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  Otherwise, ROS is as per the HPI.   Physical Exam  Additional Exam:  GEN: WDWN, NAD, Non-toxic, A & O x  3 HEENT: Atraumatic, Normocephalic. Neck supple. No masses, No LAD. Ears and Nose: No external deformity. CV: RRR, No M/G/R. No JVD. No thrill. No extra heart sounds. PULM: CTA B, no wheezes, crackles, rhonchi. No retractions. No resp. distress. No accessory muscle use.  EXTR: No c/c/e NEURO: Normal gait.  PSYCH: Normally interactive. Conversant. Not depressed or anxious appearing.  Calm demeanor.     Impression & Recommendations:  Problem # 1:  HYPERTENSION (ICD-401.9) Assessment Improved  stable  Her updated medication list for this problem includes:    Diovan Hct 80-12.5 Mg Tabs (Valsartan-hydrochlorothiazide) .Marland Kitchen... 1 by mouth daily  BP today: 110/80 Prior BP: 140/90 (08/30/2008)  Labs Reviewed: K+: 4.1 (11/15/2008) Creat: : 0.7 (11/15/2008)   Chol: 156 (11/15/2008)   HDL: 41.30 (11/15/2008)   LDL: 99 (11/15/2008)   TG: 79.0 (11/15/2008)  Problem # 2:  DEPRESSION (ICD-311) Assessment: Improved fair, not at goal, increase dosing and  Her updated medication list for this problem includes:    Bupropion Hcl 300 Mg Xr24h-tab (Bupropion hcl) .Marland Kitchen... 1 by mouth daily  Problem # 3:  FIBROMYALGIA (ICD-729.1) relatively stable and  Her updated medication list for this problem includes:    Cyclobenzaprine Hcl 5 Mg Tabs (Cyclobenzaprine hcl) .Marland Kitchen... As needed    Meloxicam 7.5 Mg Tabs (Meloxicam) .Marland Kitchen... As needed    Ibuprofen 800 Mg Tabs (Ibuprofen)  Problem # 4:  GERD (ICD-530.81) scripts written  Her updated medication list for this problem includes:    Protonix 40 Mg Tbec (Pantoprazole sodium) .Marland Kitchen... 1 by mouth daily (failed prilosec, prevacid, nexium)  Problem # 5:  HYPOTHYROIDISM (ICD-244.9)  stable, labs reviewed  Her updated medication list for this problem includes:    Levothroid 50 Mcg Tabs (Levothyroxine sodium) ..... Once daily  Labs Reviewed: TSH: 3.29 (11/15/2008)    Chol: 156 (11/15/2008)   HDL: 41.30 (11/15/2008)   LDL: 99 (11/15/2008)   TG: 79.0  (11/15/2008)  Problem # 6:  ANEMIA-NOS (ICD-285.9) Assessment: New confirm iron def  normocytic, normochromic  Orders: Venipuncture (04540) TLB-IBC Pnl (Iron/FE;Transferrin) (83550-IBC) TLB-B12 + Folate Pnl ([id number]) TLB-Ferritin (82728-FER)  Hgb: 11.8 (11/15/2008)   Hct: 34.3 (11/15/2008)   Platelets: 224.0 (11/15/2008) RBC: 3.81 (11/15/2008)   RDW: 13.2 (11/15/2008)   WBC: 7.0 (11/15/2008) MCV: 90.1 (11/15/2008)   MCHC: 34.3 (11/15/2008) TSH: 3.29 (11/15/2008)  Complete Medication List: 1)  Protonix 40 Mg Tbec (Pantoprazole sodium) .Marland Kitchen.. 1 by mouth daily (failed prilosec, prevacid, nexium) 2)  Bupropion Hcl 300 Mg Xr24h-tab (Bupropion hcl) .Marland Kitchen.. 1 by mouth daily 3)  Levothroid 50 Mcg Tabs (Levothyroxine sodium) .... Once daily 4)  Lyrica 75 Mg Caps (Pregabalin) .... Take one capsule three times a day by mouth as needed 5)  Cyclobenzaprine Hcl 5 Mg Tabs (Cyclobenzaprine hcl) .... As needed 6)  Meloxicam 7.5 Mg Tabs (Meloxicam) .... As needed 7)  Ibuprofen 800 Mg Tabs (Ibuprofen) 8)  Multivitamins Tabs (Multiple vitamin) .... One a day 9)  Diovan Hct 80-12.5 Mg Tabs (Valsartan-hydrochlorothiazide) .Marland Kitchen.. 1 by mouth  daily 10)  Sumatriptan Succinate 50 Mg Tabs (Sumatriptan succinate) .Marland Kitchen.. 1 by mouth as needed headache, may repeat after 2 hours  Patient Instructions: 1)  f/u 6 months Prescriptions: BUPROPION HCL 300 MG XR24H-TAB (BUPROPION HCL) 1 by mouth daily  #30 x 11   Entered and Authorized by:   [name] MD   Signed by:   [name] MD on 11/17/2008   Method used:   Print then Give to Patient   RxID:   [id number] PROTONIX 40 MG TBEC (PANTOPRAZOLE SODIUM) 1 by mouth daily (failed prilosec, prevacid, Nexium) Brand medically necessary #30 x 11   Entered and Authorized by:   [name] MD   Signed by:   [name] MD on 11/17/2008   Method used:   Print then Give to Patient   RxID:   [id number]   Current Allergies (reviewed  today): ! SULFA ! CODEINE

## 2010-06-05 NOTE — Progress Notes (Signed)
Summary: needs letter for permit, also needs ibuprofen  Phone Note Call from Patient Call back at [phone]   Caller: Patient Call For: Kristine Beat MD Summary of Call: Pt states she was declined for a concealed weapon permit.   She says you are familiar with this situation.  She says if she can get a note from you saying that she is not being treated for mental illness then she can get the permit.  She is being treated for depression with wellbutrin daily.  Also, pt is requesting a refill on ibuprofen 800 mg's. She takes one as needed at onset of migraines.  [facility]. Initial call taken by: Lowella Petties CMA,  November 22, 2009 10:22 AM  Follow-up for Phone Call        OK to refill ibuprofen 800 mg, #50  If she is taking Wellbutrin for depression, then I have to say that. I am happy to write her a letter saying that she currently is taking an antidepressant, but has been stable for years and has not had any active depression or anxiety  since she has been my patient. Follow-up by: Kristine Beat MD,  November 22, 2009 12:11 PM  Additional Follow-up for Phone Call Additional follow up Details #1::        this is fine for letter Additional Follow-up by: Benny Lennert CMA Duncan Dull),  November 22, 2009 2:11 PM    Additional Follow-up for Phone Call Additional follow up Details #2::    patient advised rx sent to pharmacy and letter ready for pick up.Consuello Masse CMA   Follow-up by: Benny Lennert CMA Duncan Dull),  November 23, 2009 8:46 AM  New/Updated Medications: IBUPROFEN 800 MG TABS (IBUPROFEN) take one tablet at onset of headache Prescriptions: IBUPROFEN 800 MG TABS (IBUPROFEN) take one tablet at onset of headache  #50 x 0   Entered by:   Benny Lennert CMA (AAMA)   Authorized by:   Kristine Beat MD   Signed by:   Benny Lennert CMA (AAMA) on 11/22/2009   Method used:   [facility]        [facility]  [address] [facility]       [address]  [place], [place]  [address]       Ph: [phone] or [phone]       Fax: [phone]   RxID:   [id number]

## 2010-06-05 NOTE — Assessment & Plan Note (Signed)
Summary: NOT FEELING ANY BETTER/RBH   Vital Signs:  Patient profile:   50 year old female Height:      63 inches Weight:      221.13 pounds Temp:     97.8 degrees F oral Pulse rate:   88 / minute Pulse rhythm:   regular BP sitting:   116 / 80  (left arm) Cuff size:   large  Vitals Entered By: Delilah Shan CMA Duncan Dull) (May 19, 2009 2:34 PM)  History of Present Illness: 50 yo here after being seen 4 days ago for URI symptoms because symptoms no better. Nasal congesiton, dry cough,.  No longer has a fever or chills. Scratchy throat and ear pain. No n/v/d. No rashes. She is sneezig a lot. Using Tylenol PM at night.  Son and husband have similar symptoms. Started on a Zpack on 1/10, finished last dose today.   Current Medications (verified): 1)  Protonix 40 Mg Tbec (Pantoprazole Sodium) .Marland Kitchen.. 1 By Mouth Daily (Failed Prilosec, Prevacid, Nexium) 2)  Bupropion Hcl 300 Mg Xr24h-Tab (Bupropion Hcl) .Marland Kitchen.. 1 By Mouth Daily 3)  Levothroid 50 Mcg Tabs (Levothyroxine Sodium) .... Once Daily By Mouth 4)  Lyrica 75 Mg Caps (Pregabalin) .... Take One Capsule Three Times A Day By Mouth As Needed 5)  Cyclobenzaprine Hcl 5 Mg Tabs (Cyclobenzaprine Hcl) .... As Needed 6)  Meloxicam 7.5 Mg Tabs (Meloxicam) .... As Needed 7)  Ibuprofen 800 Mg Tabs (Ibuprofen) 8)  Multivitamins   Tabs (Multiple Vitamin) .... One A Day By Mouth 9)  Diovan Hct 80-12.5 Mg Tabs (Valsartan-Hydrochlorothiazide) .Marland Kitchen.. 1 By Mouth Daily 10)  Sumatriptan Succinate 50 Mg Tabs (Sumatriptan Succinate) .Marland Kitchen.. 1 By Mouth As Needed Headache, May Repeat After 2 Hours 11)  Azithromycin 250 Mg  Tabs (Azithromycin) .... 2 By  Mouth Today and Then 1 Daily For 4 Days  Allergies (verified): 1)  ! Sulfa 2)  ! Codeine  Review of Systems      See HPI  Physical Exam  General:  alert, well-developed, well-hydrated, appropriate dress, normal appearance, cooperative to examination, good hygiene, overweight-appearing, and pale.   Ears:   clear fluid bilateral TMs, improved. Nose:  nasal dischargemucosal pallor.   pos ethmoid sinuses Mouth:  Oral mucosa and oropharynx without lesions or exudates.  Teeth in good repair. Lungs:  Normal respiratory effort, chest expands symmetrically. Lungs are clear to auscultation, no crackles or wheezes. Heart:  Normal rate and regular rhythm. S1 and S2 normal without gallop, murmur, click, rub or other extra sounds. Psych:  Oriented X3, memory intact for recent and remote, normally interactive, good eye contact   Impression & Recommendations:  Problem # 1:  ACUTE ETHMOIDAL SINUSITIS (ICD-461.2) Assessment Improved Reassurance provided.  Explained that Zpack stays in system for 10 days.  Has not spiked temp since starting it. Symtpoms can take weeks to fully resolve.  Pt expressed understanding. Her updated medication list for this problem includes:    Azithromycin 250 Mg Tabs (Azithromycin) .Marland Kitchen... 2 by  mouth today and then 1 daily for 4 days  Complete Medication List: 1)  Protonix 40 Mg Tbec (Pantoprazole sodium) .Marland Kitchen.. 1 by mouth daily (failed prilosec, prevacid, nexium) 2)  Bupropion Hcl 300 Mg Xr24h-tab (Bupropion hcl) .Marland Kitchen.. 1 by mouth daily 3)  Levothroid 50 Mcg Tabs (Levothyroxine sodium) .... Once daily by mouth 4)  Lyrica 75 Mg Caps (Pregabalin) .... Take one capsule three times a day by mouth as needed 5)  Cyclobenzaprine Hcl 5 Mg Tabs (Cyclobenzaprine hcl) .Marland KitchenMarland KitchenMarland Kitchen  As needed 6)  Meloxicam 7.5 Mg Tabs (Meloxicam) .... As needed 7)  Ibuprofen 800 Mg Tabs (Ibuprofen) 8)  Multivitamins Tabs (Multiple vitamin) .... One a day by mouth 9)  Diovan Hct 80-12.5 Mg Tabs (Valsartan-hydrochlorothiazide) .Marland Kitchen.. 1 by mouth daily 10)  Sumatriptan Succinate 50 Mg Tabs (Sumatriptan succinate) .Marland Kitchen.. 1 by mouth as needed headache, may repeat after 2 hours 11)  Azithromycin 250 Mg Tabs (Azithromycin) .... 2 by  mouth today and then 1 daily for 4 days  Patient Instructions: 1)  If no better in 5 days, call  office and ask to speak to me or my nurse. 2)  Have a good weekend.

## 2010-06-05 NOTE — Letter (Signed)
Summary: Generic Letter  [place] at [facility]  [address] [place], [place] [address]   Phone: [phone]  Fax: [phone]    11/21/2008     Kristine Curtis [address] [place], [place]  [address]     Dear Ms. [facility],  I have tried to reach by phone multiple times but, have not been able to get in contact with you doctor copland got labs back and listed below are his recommendation.    Iron levels are significantly low. Recommend taking Iron Sulfate 325 mg (Iron tablets are available over the counter VERY CHEAP) - 1 tablet twice a day Can occaisionally make constipated - it is OK to use Docusate / Colace prn as a stool softener and increase fiber Also can make stools black      Sincerely,   Benny Lennert CMA

## 2010-06-05 NOTE — Letter (Signed)
Summary: Generic Letter  [place] at [facility]  [address] [place], [place] [address]   Phone: [phone]  Fax: [phone]    11/23/2009  Kristine Curtis [address] [place], [place]  [address]  TO WHOM IT MAY CONCERN,   [name] is a patient of mine that I know well. She is applying right now for a concealed carry permit.  She is very stable, does not have any active unstable mental illness, but she does take an antidepressant as a maintenance medication. She has been stable for multiple years on this without problems. She has been doing well for the time that I have known her.  Any questions, feel free to contact our office.   Sincerely,   [name] MD

## 2010-06-05 NOTE — Letter (Signed)
Summary: Out of Work  [employer] at [facility]  [address] [place], [place] [address]   Phone: [phone]  Fax: [phone]    July 24, 2009   Employee:  MAURIANA DANN [facility]    To Whom It May Concern:   For Medical reasons, please excuse the above named employee from work for the following dates:  Start:   07/24/2009  End:   07/26/2009  If you need additional information, please feel free to contact our office.         Sincerely,    [name] MD

## 2010-06-05 NOTE — Letter (Signed)
Summary: [facility]   Imported By: Lanelle Bal 12/12/2008 13:50:07  _____________________________________________________________________  External Attachment:    Type:   Image     Comment:   External Document

## 2010-06-07 NOTE — Progress Notes (Signed)
Summary: cyclobenzaprine  Phone Note Refill Request Message from:  Patient on May 23, 2010 2:30 PM  Refills Requested: Medication #1:  CYCLOBENZAPRINE HCL 5 MG TABS as needed Patient is requesting refill to be sent to [facility].   Initial call taken by: Melody Comas,  May 23, 2010 2:30 PM    Prescriptions: CYCLOBENZAPRINE HCL 5 MG TABS (CYCLOBENZAPRINE HCL) as needed  #30 x 5   Entered and Authorized by:   [name] MD   Signed by:   [name] MD on 05/23/2010   Method used:   [facility] to        [facility]  [address] (retail)       [address]       [place], [place]  [address]       Ph: [phone] or [phone]       Fax: [phone]   RxID:   [id number]

## 2010-06-08 NOTE — Letter (Signed)
Summary: Bradd Canary, MD  Bradd Canary, MD   Imported By: Lanelle Bal 12/12/2008 14:45:46  _____________________________________________________________________  External Attachment:    Type:   Image     Comment:   External Document

## 2010-08-13 LAB — URINALYSIS, ROUTINE W REFLEX MICROSCOPIC
Bilirubin Urine: NEGATIVE
Glucose, UA: NEGATIVE mg/dL
Ketones, ur: 15 mg/dL — AB
Leukocytes, UA: NEGATIVE
Nitrite: NEGATIVE
Protein, ur: NEGATIVE mg/dL
Specific Gravity, Urine: 1.02 (ref 1.005–1.030)
Urobilinogen, UA: 0.2 mg/dL (ref 0.0–1.0)
pH: 6 (ref 5.0–8.0)

## 2010-08-13 LAB — COMPREHENSIVE METABOLIC PANEL
ALT: 37 U/L — ABNORMAL HIGH (ref 0–35)
ALT: 53 U/L — ABNORMAL HIGH (ref 0–35)
AST: 37 U/L (ref 0–37)
AST: 54 U/L — ABNORMAL HIGH (ref 0–37)
Albumin: 2.7 g/dL — ABNORMAL LOW (ref 3.5–5.2)
Albumin: 3.2 g/dL — ABNORMAL LOW (ref 3.5–5.2)
Alkaline Phosphatase: 84 U/L (ref 39–117)
Alkaline Phosphatase: 88 U/L (ref 39–117)
BUN: 3 mg/dL — ABNORMAL LOW (ref 6–23)
BUN: 4 mg/dL — ABNORMAL LOW (ref 6–23)
CO2: 25 mEq/L (ref 19–32)
CO2: 25 mEq/L (ref 19–32)
Calcium: 8.8 mg/dL (ref 8.4–10.5)
Calcium: 8.9 mg/dL (ref 8.4–10.5)
Chloride: 104 mEq/L (ref 96–112)
Chloride: 106 mEq/L (ref 96–112)
Creatinine, Ser: 0.72 mg/dL (ref 0.4–1.2)
Creatinine, Ser: 0.74 mg/dL (ref 0.4–1.2)
GFR calc Af Amer: >60 mL/min (ref >60)
GFR calc Af Amer: >60 mL/min (ref >60)
GFR calc non Af Amer: >60 mL/min (ref >60)
GFR calc non Af Amer: >60 mL/min (ref >60)
Glucose, Bld: 110 mg/dL — ABNORMAL HIGH (ref 70–99)
Glucose, Bld: 159 mg/dL — ABNORMAL HIGH (ref 70–99)
Potassium: 3.7 mEq/L (ref 3.5–5.1)
Potassium: 4.2 mEq/L (ref 3.5–5.1)
Sodium: 139 mEq/L (ref 135–145)
Sodium: 140 mEq/L (ref 135–145)
Total Bilirubin: 0.5 mg/dL (ref 0.3–1.2)
Total Bilirubin: 0.7 mg/dL (ref 0.3–1.2)
Total Protein: 6.1 g/dL (ref 6.0–8.3)
Total Protein: 7 g/dL (ref 6.0–8.3)

## 2010-08-13 LAB — DIFFERENTIAL
Basophils Absolute: 0 10*3/uL (ref 0.0–0.1)
Basophils Relative: 0 % (ref 0–1)
Eosinophils Absolute: 0.5 10*3/uL (ref 0.0–0.7)
Eosinophils Relative: 6 % — ABNORMAL HIGH (ref 0–5)
Lymphocytes Relative: 27 % (ref 12–46)
Lymphs Abs: 2.4 10*3/uL (ref 0.7–4.0)
Monocytes Absolute: 0.6 10*3/uL (ref 0.1–1.0)
Monocytes Relative: 6 % (ref 3–12)
Neutro Abs: 5.5 10*3/uL (ref 1.7–7.7)
Neutrophils Relative %: 61 % (ref 43–77)

## 2010-08-13 LAB — CBC
HCT: 31 % — ABNORMAL LOW (ref 36.0–46.0)
HCT: 37.1 % (ref 36.0–46.0)
Hemoglobin: 10.7 g/dL — ABNORMAL LOW (ref 12.0–15.0)
Hemoglobin: 12.6 g/dL (ref 12.0–15.0)
MCHC: 33.8 g/dL (ref 30.0–36.0)
MCHC: 34.5 g/dL (ref 30.0–36.0)
MCV: 90.4 fL (ref 78.0–100.0)
MCV: 90.9 fL (ref 78.0–100.0)
Platelets: 271 10*3/uL (ref 150–400)
Platelets: 301 10*3/uL (ref 150–400)
RBC: 3.42 MIL/uL — ABNORMAL LOW (ref 3.87–5.11)
RBC: 4.11 MIL/uL (ref 3.87–5.11)
RDW: 13.1 % (ref 11.5–15.5)
RDW: 13.1 % (ref 11.5–15.5)
WBC: 8.9 10*3/uL (ref 4.0–10.5)
WBC: 9 10*3/uL (ref 4.0–10.5)

## 2010-08-13 LAB — URINE MICROSCOPIC-ADD ON

## 2010-08-13 LAB — PROTIME-INR
INR: 1 (ref 0.00–1.49)
Prothrombin Time: 13.7 seconds (ref 11.6–15.2)

## 2010-08-14 LAB — CBC
HCT: 36.9 % (ref 36.0–46.0)
Hemoglobin: 12.6 g/dL (ref 12.0–15.0)
MCHC: 34.2 g/dL (ref 30.0–36.0)
MCV: 88.7 fL (ref 78.0–100.0)
Platelets: 249 10*3/uL (ref 150–400)
RBC: 4.16 MIL/uL (ref 3.87–5.11)
RDW: 12.6 % (ref 11.5–15.5)
WBC: 12 10*3/uL — ABNORMAL HIGH (ref 4.0–10.5)

## 2010-08-14 LAB — DIFFERENTIAL
Basophils Absolute: 0.1 10*3/uL (ref 0.0–0.1)
Basophils Relative: 1 % (ref 0–1)
Eosinophils Absolute: 0.3 10*3/uL (ref 0.0–0.7)
Eosinophils Relative: 3 % (ref 0–5)
Lymphocytes Relative: 16 % (ref 12–46)
Lymphs Abs: 1.9 10*3/uL (ref 0.7–4.0)
Monocytes Absolute: 0.4 10*3/uL (ref 0.1–1.0)
Monocytes Relative: 3 % (ref 3–12)
Neutro Abs: 9.3 10*3/uL — ABNORMAL HIGH (ref 1.7–7.7)
Neutrophils Relative %: 78 % — ABNORMAL HIGH (ref 43–77)

## 2010-08-14 LAB — URINALYSIS, ROUTINE W REFLEX MICROSCOPIC
Bilirubin Urine: NEGATIVE
Glucose, UA: NEGATIVE mg/dL
Hgb urine dipstick: NEGATIVE
Ketones, ur: NEGATIVE mg/dL
Nitrite: NEGATIVE
Protein, ur: NEGATIVE mg/dL
Specific Gravity, Urine: 1.023 (ref 1.005–1.030)
Urobilinogen, UA: 0.2 mg/dL (ref 0.0–1.0)
pH: 6 (ref 5.0–8.0)

## 2010-08-14 LAB — COMPREHENSIVE METABOLIC PANEL
ALT: 19 U/L (ref 0–35)
AST: 26 U/L (ref 0–37)
Albumin: 4 g/dL (ref 3.5–5.2)
Alkaline Phosphatase: 103 U/L (ref 39–117)
BUN: 11 mg/dL (ref 6–23)
CO2: 27 mEq/L (ref 19–32)
Calcium: 8.5 mg/dL (ref 8.4–10.5)
Chloride: 103 mEq/L (ref 96–112)
Creatinine, Ser: 0.8 mg/dL (ref 0.4–1.2)
GFR calc Af Amer: >60 mL/min (ref >60)
GFR calc non Af Amer: >60 mL/min (ref >60)
Glucose, Bld: 111 mg/dL — ABNORMAL HIGH (ref 70–99)
Potassium: 3.9 mEq/L (ref 3.5–5.1)
Sodium: 140 mEq/L (ref 135–145)
Total Bilirubin: 0.5 mg/dL (ref 0.3–1.2)
Total Protein: 7.3 g/dL (ref 6.0–8.3)

## 2010-08-14 LAB — LIPASE, BLOOD: Lipase: 191 U/L (ref 23–300)

## 2010-09-18 NOTE — Discharge Summary (Signed)
Kristine Curtis, Kristine Curtis             ACCOUNT NO.:  [id number]   MEDICAL RECORD NO.:  [id number]          PATIENT TYPE:  INP   LOCATION:  5127                         FACILITY:  [facility]   PHYSICIAN:  Sharlet Salina T. Hoxworth, M.D.DATE OF BIRTH:  [birth year]   DATE OF ADMISSION:  10/05/2008  DATE OF DISCHARGE:  10/06/2008                               DISCHARGE SUMMARY   CHIEF COMPLAINT/REASON FOR ADMISSION:  Kristine Curtis is a 50 year old  female patient known to Dr. Johna Sheriff from evaluation for elective  cholecystectomy at the office.  The patient had actually been seen in  the urgent clinic 24 hours previously.  She had 3 days of persistent  right upper quadrant abdominal pain.  Gallbladder ultrasound showed  thickened gallbladder wall and gallstones.  The patient was stable  enough to go home with plans to come to the hospital on the date of  admission to undergo surgical procedure.  The patient subsequently was  taken to the OR on October 05, 2008, where she underwent laparoscopic  cholecystectomy with a normal intraoperative cholangiogram for  cholelithiasis and cholecystitis.  The patient was admitted with  following diagnoses, severe acute cholecystitis postoperative  laparoscopic cholecystectomy.   HOSPITAL COURSE:  The patient underwent surgery as described and was  sent to the general surgical floor recovery room.  By postop day #1, she  was afebrile.  Vital signs were stable.  She was briefly on nasal  cannula O2 and sating 98%, but this was quickly weaned.  Her white count  was 8900, hemoglobin stable at 10.7, potassium 4.2, BUN 3, creatinine  0.74.  Her LFTs were only mildly elevated with a normal total bilirubin  of 0.5.  Her blood pressure was a little low for her in the 90s, but  otherwise she was stable.  She was tolerating a solid diet.  She had a  JP drain in place that had small amount of bloody-tinged drainage.  Bowel sounds were present and otherwise the abdominal exam was  unremarkable.  Dr. Johna Sheriff came by and evaluated the patient,  determined that she did not need to continue the drain at discharge, and  he removed the drain and otherwise deemed the patient appropriate for  discharge home.   FINAL DISCHARGE DIAGNOSIS:  Severe acute cholecystitis with associated  abdominal pain status post laparoscopic cholecystectomy with normal  intraoperative cholangiogram.   DISCHARGE MEDICATIONS:  1. Lyrica 75 mg daily.  2. Wellbutrin 150 mg daily.  3. Diovan 80 mg daily.  4. Hydrochlorothiazide 12.5 mg daily.  5. Protonix 40 mg daily.  6. Mobic 7.5 mg t.i.d.  7. Flexeril 5 mg t.i.d.  8. Synthroid 50 mcg daily.  9. Imitrex 50 mg as needed for headache.  10.Ibuprofen 800 mg as needed for pain.  11.Percocet 5/325 as needed for pain.  She has also been given      additional Percocet to cover the postoperative period, prescription      has been written.  12.Zofran 8 mg p.r.n. nausea.   OTHER DISCHARGE INSTRUCTIONS:  No restrictions in diet.   ACTIVITY:  Increase activity slowly.  May walk up  steps.  May shower.  No driving for 1 week.  No lifting greater than 15 pounds for 2 weeks.   WOUND CARE:  Allow Steri-Strips to fall off.   FOLLOWUP APPOINTMENT:  She needs to see Dr. Johna Sheriff in 2 weeks.  She  needs to call for an appointment.      Allison L. Rennis Harding, N.P.      Lorne Skeens. Hoxworth, M.D.  Electronically Signed    ALE/MEDQ  D:  11/01/2008  T:  11/02/2008  Job:  [id number]

## 2010-09-18 NOTE — Op Note (Signed)
Kristine Curtis, Kristine Curtis             ACCOUNT NO.:  [id number]   MEDICAL RECORD NO.:  [id number]          PATIENT TYPE:  OIB   LOCATION:  5127                         FACILITY:  [facility]   PHYSICIAN:  Sharlet Salina T. Hoxworth, M.D.DATE OF BIRTH:  Nov 05, 1960   DATE OF PROCEDURE:  10/05/2008  DATE OF DISCHARGE:                               OPERATIVE REPORT   PREOPERATIVE DIAGNOSIS:  Cholelithiasis and acute cholecystitis.   POSTOPERATIVE DIAGNOSIS:  Cholelithiasis and acute cholecystitis.   SURGICAL PROCEDURE:  Laparoscopic cholecystectomy with intraoperative  cholangiogram.   SURGEON:  Sharlet Salina T. Hoxworth, MD   ANESTHESIA:  General.   BRIEF HISTORY:  Kristine Curtis is a 50 year old female, who presents with  3 days of persistent right upper quadrant abdominal pain and nausea.  She had a gallbladder ultrasound showing a thickened gallbladder wall  and gallstones.  She has persistent symptoms and tenderness and  laparoscopic cholecystectomy with cholangiogram has been recommended and  accepted.  The nature of procedure, its indications, risks of bleeding,  infection, bile leak, and bile duct injury were discussed and  understood.  She is now brought to the operating room for this  procedure.   DESCRIPTION OF OPERATION:  The patient was brought to the operating  room, placed in the supine position on the operating table, and general  endotracheal anesthesia was induced.  She had been given preoperative IV  antibiotics.  PAS were in place.  Correct patient and procedure were  verified after the abdomen was widely sterilely prepped and draped.  Access was obtained in the midline above the umbilicus due to obesity at  the site of a previous trocar site from her robotic hysterectomy, and  dissection was carried down to the midline fascia, which was incised for  1 cm, and the peritoneum entered under direct vision.  Through a  mattress suture of 0 Vicryl, the Hasson trocar was placed and  pneumoperitoneum established.  Under direct vision, an 11-mm trocar was  placed in subxiphoid area and two 5-mm trocars on the right subcostal  margin.  The omentum was adherent to the gallbladder and when stripped  away, there was severe acute cholecystitis with a tensely distended  gallbladder and areas of patchy gangrene.  The gallbladder was aspirated  and decompressed, and the fundus was grasped and elevated up over the  liver.  Inflammatory adhesions were taken down off the gallbladder  bluntly and then the infundibulum was able to be grasped and retracted  inferolaterally.  Peritoneum anterior and posterior down the  infundibulum was incised and using mostly careful blunt dissection, the  distal gallbladder was mobilized and then it sort of unfolded with a  redundant infundibulum and exposed the cystic duct and gallbladder  junction quite nicely, which was dissected 360 degrees, and the cystic  duct dissected out over about a centimeter.  The cystic duct was  slightly enlarged.  At this point, the cystic duct was clipped at the  gallbladder junction and operative cholangiogram was obtained through  the cystic duct.  This showed good filling of a normal size common bile  duct and intrahepatic ducts with  free flow into the duodenum and no  filling defects and fairly long cystic duct remnant.  Following this,  the cholangiocath was removed.  The cystic duct was triply clipped  proximally with the clip seen to go across the width of the duct and  then it was divided.  Anterior and posterior branches of the cystic  artery were isolated and divided between clips.  The gallbladder was  then dissected free from its bed using hook cautery with a tedious  dissection due to the severe inflammatory change.  This was then placed  in EndoCatch bag and brought out through the umbilical incision.  The  right upper quadrant was then thoroughly irrigated and suctioned until  clear.  Hemostasis was  obtained in the gallbladder bed with cautery and  with a Surgicel pack.  Due to the severe inflammatory change, the left  was closed suction drain in the subhepatic space, which was brought out  through one of the lateral trocar sites.  Hemostasis was assured.  Trocars were then removed and all CO2 evacuated, and the mattress suture  secured at the supraumbilical incision.  Skin incisions were closed with  subcuticular Monocryl and Steri-Strips.  Dressings were applied, and the  patient was taken to recovery in good condition.      Lorne Skeens. Hoxworth, M.D.  Electronically Signed     BTH/MEDQ  D:  10/05/2008  T:  10/05/2008  Job:  [id number]

## 2010-09-18 NOTE — H&P (Signed)
[name], [name] NO.:  [id number]   MEDICAL RECORD NO.:  [id number]          PATIENT TYPE:  EMS   LOCATION:  MAJO                         FACILITY:  [facility]   PHYSICIAN:  Velora Heckler, MD      DATE OF BIRTH:  [birth year]   DATE OF ADMISSION:  10/01/2008  DATE OF DISCHARGE:  10/01/2008                              HISTORY & PHYSICAL   REASON FOR ADMISSION:  Subacute cholecystitis, cholelithiasis, and  abdominal pain.   HISTORY OF PRESENT ILLNESS:  Kristine Curtis is a 50 year old white  female from [place], [place].  She presented to the [facility] Emergency Room at [facility] on Saturday, Sep 24, 2008, with  abdominal pain.  She was transferred to [facility] for workup  including abdominal ultrasound, which showed a thick-walled gallbladder  containing stones.  There was a positive sonographic Murphy sign.  The  patient was discharged from the emergency department on clear liquid  diet, Cipro, and narcotic analgesics.  The patient presents to our  office at [facility] today, October 04, 2008, with persistent  abdominal pain and inability to tolerate her diet.   PAST MEDICAL HISTORY:  1. History of depression.  2. History of migraine headaches.  3. History of hypertension.  4. History of hypothyroidism.  5. History of fibromyalgia.  6. History of gastroesophageal reflux.  7. Status post hysterectomy in 2007.  8. Status post cesarean section x2.  9. Status post rotator cuff repair in 2002.   MEDICATIONS:  1. Lyrica 75 mg daily.  2. Wellbutrin XL 150 mg daily.  3. Diovan 80 mg daily.  4. Hydrochlorothiazide 12.5 mg daily.  5. Synthroid 50 mcg daily.  6. Protonix 40 mg daily.  7. Mobic 7.5 mg daily.  8. Flexeril 5 mg daily.  9. Ibuprofen p.r.n.  10.Meclizine p.r.n.  11.Imitrex p.r.n.  12.Bepreve p.r.n.  13.Zofran p.r.n.  14.Oxycodone p.r.n.  15.Cipro 500 mg b.i.d.   ALLERGIES:  1. CODEINE.  2. SULFA.   SOCIAL  HISTORY:  The patient is married.  She is accompanied by her  husband.  She has 2 children.  She does not smoke.  She does not drink  alcohol.  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   FAMILY HISTORY:  Notable for hypertension, coronary artery disease in  the patient's father and pancreatic cancer in the patient's mother.   REVIEW OF SYSTEMS:  Fifteen-system was reviewed documented on medical  record without significant other findings except as noted above.   PHYSICAL EXAMINATION:  GENERAL:  A 50 year old moderately obese white  female with mild discomfort.  VITAL SIGNS:  Blood pressure 123/81, pulse 111, temperature 100.4, and  weight 230 pounds.  HEENT:  Normocephalic and atraumatic.  Sclerae clear.  Conjunctiva  clear.  Pupils equal and reactive.  Dentition fair.  Mucous membranes  moist.  Voice normal.  Palpation of the neck shows no lymphadenopathy.  No mass.  No tenderness.  LUNGS:  Clear to auscultation.  CARDIAC:  Regular rate and rhythm.  Peripheral pulses are full.  EXTREMITIES:  Nontender without edema.  NEUROLOGIC:  The patient is alert and oriented.  ABDOMEN:  Soft without distention.  Bowel sounds are present.  There is  voluntary guarding on palpation in the right upper quadrant.  There is a  Murphy sign present with inspiration.  There is moderate to significant  tenderness with palpation.   DATA REVIEWED:  Ultrasound report and ER physician notes from Oct 01, 2008, from [facility] Emergency Department.   DATA REQUESTED:  Preop testing to include laboratory studies and EKG.   IMPRESSION:  1. Subacute cholecystitis.  2. Cholelithiasis.  3. Obesity.   PLAN:  The patient is holding around with clear liquid diet and oral  antibiotic therapy.  However, she continues to run a low-grade fever and  remained symptomatic with worrisome clinical exam.  I think, she needs  to proceed with cholecystectomy in the immediate future.  I do not  think  she has to be admitted urgently this evening, but would benefit from  cholecystectomy within the next 1-2 days.   I have contacted Dr. Glenna Fellows, my partner, at [facility].  He has availability in the OR schedule for tomorrow,  Wednesday, October 05, 2008.  We will schedule the patient for October 05, 2008,  at 10:00 a.m. with Dr. Glenna Fellows at [facility] for  cholecystectomy.   I have discussed with the patient and her family the indications for  surgery.  I have discussed the risk and the benefits.  I have discussed  the likely possibility of conversion to open surgery, although she may  be successful with laparoscopic approach.  They understand and agree to  proceed.  We will make arrangements at [facility] with Dr.  Glenna Fellows on October 05, 2008.      Velora Heckler, MD  Electronically Signed     TMG/MEDQ  D:  10/04/2008  T:  10/05/2008  Job:  [id number]   cc:   Lorne Skeens. Hoxworth, M.D.  Juleen China, MD  Hilario Quarry, M.D.

## 2010-09-21 NOTE — Op Note (Signed)
Kristine Curtis, Kristine Curtis             ACCOUNT NO.:  [id number]   MEDICAL RECORD NO.:  [id number]          PATIENT TYPE:  AMB   LOCATION:  DAY                          FACILITY:  [facility]   PHYSICIAN:  Crist Fat. Rivard, M.D. DATE OF BIRTH:  02-29-1961   DATE OF PROCEDURE:  06/06/2006  DATE OF DISCHARGE:                               OPERATIVE REPORT   PREOPERATIVE DIAGNOSIS:  Uterine fibroids with menorrhagia and  dysmenorrhea.   POSTOPERATIVE DIAGNOSES:  1. Uterine fibroids with menorrhagia and dysmenorrhea.  2. Pelvic adhesions.   ANESTHESIA:  General.   PROCEDURE:  Robotic total hysterectomy with lysis of adhesions.   SURGEON:  Dr. Estanislado Pandy.   ASSISTANT:  Henreitta Leber, PA.   ESTIMATED BLOOD LOSS:  100 mL.   DESCRIPTION OF PROCEDURE:  After being informed of the planned procedure  with possible complications including bleeding, infection, injury to  bowel, bladder or ureters, need for laparotomy, length of procedure  being longer than laparotomy, dependent edema and hospital stay,  informed consent is obtained.  The patient is taken to OR #10, given  general anesthesia with endotracheal intubation without complication.  She is placed in the lithotomy position on a sticky mattress, arms  padded, hands padded, shoulder restraints and chest restraints.  Sequential compression device are placed on both legs.  She is prepped  and draped in a sterile fashion.  GYN exam reveals a retroverted uterus,  slightly bulky but the exam is limited by body habitus. Adnexa are not  felt through the pelvic exam.  A weighted speculum is inserted in the  vagina, anterior lip of the cervix os grasped with a tenaculum forceps  and the uterus is sounded at 10 cm.  We easily dilate the cervix using  Pratt dilator until #24 which allows easy entry of the Rumi intrauterine  manipulator with the vaginal occluder and a 3 cm Koh-ring.  The Rumi  manipulator balloon is insufflated with 7 mL of saline and the  Koh-ring  is sutured to the cervix with #0 Vicryl.  The tenaculum and weighted  speculum are removed and a Foley catheter is placed in the bladder.   We measure 5 cm above the umbilicus midline for our camera port and  infiltrate this area with 3 mL of Marcaine 0.25. We perform a vertical  10-mm incision and we bluntly bring it down to the fascia.  The fascia  is grasped with tenaculum and incised with Mayo scissors.  The  peritoneum is identified, grasped with hemostat and incised with  Metzenbaum scissors.  We can now place a pursestring suture of #0 Vicryl  on the fascia and insert a Hassan trocar.  This trocar is held in place  with the previously placed pursestring suture.  We can now create our  pneumoperitoneum with CO2 at a maximum pressure of 16 mmHg.   OBSERVATION:  We see a midline infraumbilical adhesion with the  abdominal wall and the omentum but we can easily go around and visualize  the pelvic cavity.  The uterus is bulky but regular, tubes are status  post bilateral tubal ligation and ovaries  are normal, cul-de-sac is free  of adhesions.  We are unable to visualize the appendix but we do see the  part of the liver edge which appears normal.   We then determined placement of all our operative trocars and these are  placed as follows:  An 8-mm robotic trocar is placed in the left lower  quadrant same on the right lower quadrant, another 8-mm robotic trocar  is placed 10 cm perpendicular in the left flank and a 10-mm patient side  assistant trocar is placed in the right flank 15 cm from the camera  port.  All of these trocar sites are previously infiltrated with  Marcaine 0.25 and they are all put in place under direct visualization.  We then proceed with docking of the robot which is completed easily and  we place a PK gyrus forceps in arm #2 and a monopolar hot shear scissors  in arm #1 and we proceed with console time.   We start with sharp lysis of the previously  described adhesion with the  omentum in the abdominal wall and this is done systematically,  hemostasis is complete.  We can now put our attention in the pelvis and  we start with the right side where the utero-ovarian ligament is  cauterized with gyrus forceps and section, the round ligament is  cauterized with gyrus forceps and section. The anterior leaf of the  broad ligament is incised with scissors, which allows [place] to retract  bladder.  The posterior leaf of the broad ligament is then sharply  dissected and we proceed with skeletonization of the uterine artery.  This is done sharply with a hot scissors.  We then put a lot of  attention on the dissection of the bladder away from the anterior cul-de-  sac and this is done sharply and bluntly with no injury to the bladder.  We then proceed on the left side with cauterization of the utero-ovarian  ligament with PK forceps and sectioning this ligament, cauterization of  the round ligament with PK forceps and sectioning this ligament, opening  of the anterior leaf of the broad ligament.  This allows [place] to complete  the dissection of the bladder away from the anterior cul-de-sac.  The  posterior leaf of the broad ligament is then sharply dissected, allowing  [place] to skeletonize the uterine artery on that side as well. Both ureters  had been previously identified and located away from our site of  dissection.  The uterine arteries are then cauterized with PK forceps on  both sides.  With cephalic pressure on Koh-ring, we are able to identify  the anterior cul-de-sac and after insufflating the vaginal occluder we  are able to proceed with anterior colpotomy using monopolar scissors.  We then proceed with posterior colpotomy in the same fashion and we can  now do a circumferential opening of the vaginal vault freeing the uterus  entirely.  The uterus is then delivered vaginally intact.  We then proceed with closure of the vaginal vault with  figure-of-eight  stitches of #0 Vicryl.  Each angle is sutured to the adjacent  uterosacral ligament which had which has remained intact.   We then irrigate profusely with warm saline and complete hemostasis on  the left uterine artery bundle after identifying the ureter.  Hemostasis  is adequate.  We systematically revised all pedicles and are satisfied  with hemostasis.   Instruments are then removed, all trocars are removed under direct  visualization after evacuating  the pneumoperitoneum.   The fascia of the supraumbilical incision is closed with the previously  placed pursestring suture after confirming no bowel loop is protruding.  The fascia of the patient side assistant port 10-mm port is closed under  direct visualization with a figure-of-eight stitch of #0 Vicryl.  All  incisions are closed with 4-0 Monocryl and Steri-Strips.   Instrument and sponge count is complete x2.  Estimated blood loss is 100  mL. The procedure is very well tolerated by the patient who is taken to  recovery room in a well and stable condition. The specimen was weighed  in the operating room at 180 grams.      Crist Fat Rivard, M.D.  Electronically Signed     SAR/MEDQ  D:  06/06/2006  T:  06/06/2006  Job:  [id number]

## 2010-09-21 NOTE — H&P (Signed)
NAMEJEN, BENEDICT             ACCOUNT NO.:  [id number]   MEDICAL RECORD NO.:  [id number]         PATIENT TYPE:  LAMB   LOCATION:                                FACILITY:  [facility]   PHYSICIAN:  Crist Fat. Rivard, M.D. DATE OF BIRTH:  29-Jun-1960   DATE OF ADMISSION:  DATE OF DISCHARGE:                              HISTORY & PHYSICAL   HISTORY AND PHYSICAL EXAMINATION   HISTORY OF PRESENT ILLNESS:  Ms. Mcdaniel is a 50 year old married white  female, para 2002 who presents for robot-assisted hysterectomy because  of symptomatic uterine fibroids, menorrhagia and dysmenorrhea.  The  patient has a history of pituitary adenoma which previously was managed  with Dostinex 0.25 mg which she took twice weekly.  While on this  regimen the patient's menstrual cycles were regular.  The patient chose,  however, to discontinue her Dostinex in February 2007 and began to have  menstrual periods every 24-34 days.  This flow would last for  approximately 6 days, 2 of which required the change of a pad every 2  hours.  She also reports progressively worsening dysmenorrhea with  cramps which she rates as a 7/10 on a 10-point pain scale and the  passage of clots as large as 4 cm.  She denies any vaginitis symptoms,  nausea, vomiting, diarrhea, fever or change in her bowel habits.  A  sonohistogram in December 2007 showed a 1.7 x 1.36 cm endometrial polyp  and a posterior submucosal fibroid measuring 3.46 cm.  A concomitant  endometrial biopsy showed proliferative endometrium with break-down.  There was no atypia or malignancy identified, however.  Labs done in  November 2007 revealed mild anemia (hemoglobin 11.5), a normal  prolactin, and low thyroid function (patient is being followed by Dr.  Dorothyann Peng for this).  A review of both medical and surgical options  were discussed with patient to include observation regarding the  management of her symptoms.  Those options included oral contraceptives,  cyclic progesterone, endometrial ablation, hysteroscopy with D&C and  hysterectomy.  After careful consideration, patient has decided to  proceed with definitive therapy in the form of hysterectomy for  eradication of her symptoms.   PAST MEDICAL HISTORY:  OB history:  Gravida 2, para 2002.  Patient had  both births by cesarean section.   Gyn history:  Menarche 50 years old.  Last menstrual period May 29, 2006.  Patient has used bilateral tubal ligation as a method of  contraception.  Denies any history of sexually transmitted diseases, has  a remote history of an abnormal Pap smear, her last normal Pap smear was  November 2007.   Medical history:  Non-insulin-dependent diabetes, hypertension,  depression, left breast cyst (benign), pituitary adenoma (MRI in April  2007 was negative for adenoma), viral cardiomyopathy, PMS, anemia,  hypothyroid, migraines, irritable bowel syndrome, and asthma.   PAST SURGICAL HISTORY:  1981 eye surgery.  1992 bilateral tubal  ligation.  2002 right rotator cuff repair.  Patient states that  anesthesia causes her severe nausea and vomiting.  She denies any  history of blood transfusions.   FAMILY  HISTORY:  Pancreatic cancer, cardiovascular disease, diabetes  mellitus, hypertension, asthma, migraines and interstitial cystitis.   SOCIAL HISTORY:  The patient is married and she lives with her husband  and two children.  She works as an Teacher, music.  Habits:  She does  not use tobacco or alcohol.   CURRENT MEDICATIONS:  Cymbalta 60 mg daily, Janumet 50/500 1 tablet  daily, Protonix 40 mg daily.   ALLERGIES:  THE PATIENT IS ALLERGIC TO SULFA WHICH CAUSES DYSESTHESIAS  AND CODEINE WHICH CAUSES A RASH.   REVIEW OF SYSTEMS:  Occasional urinary urgency, dyspareunia, occasional  constipation, chest pain with shortness of breath with asthma flares but  denies any fever, dysuria, hematuria, headache, vision changes, and  except as mentioned in the  History of Present Illness, the patient's  review of systems is otherwise negative.   PHYSICAL EXAMINATION:  Blood pressure is 120/80, weight is 228 pounds,  height is 5 feet 4 inches tall.  Physical exam - EAR, NOSE, THROAT:  Pupils are equal.  Hearing is  normal.  Throat is clear.  NECK is supple.  There are no masses, thyromegaly or cervical  adenopathy.  HEART:  Regular rate and rhythm.  LUNGS are clear to auscultation.  There are no wheezes, rales or  rhonchi.  BACK:  No CVA tenderness.  ABDOMEN:  No tenderness, masses or organomegaly.  EXTREMITIES:  No clubbing, cyanosis or edema.  PELVIC EXAM:  EBUS is within normal limits.  Vagina is rugose.  Cervix  is nontender without lesions.  Uterus is approximately 12-14 weeks'  size, mobile, bulky and retroverted.  There is no tenderness.  Adnexa  without tenderness or masses.   Hemoglobin 13.6.   IMPRESSION:  1. Symptomatic uterine fibroids.  2. Menorrhagia.  3. Dysmenorrhea.   DISPOSITION:  A long discussion was held with patient regarding the  indications for her procedure along with its risks which include but are  not limited to reaction to anesthesia, damage to adjacent organs,  infection, excessive bleeding and the possibility of laparotomy.  The  patient was also informed that her procedure will take longer than a  standard laparotomy procedure and that transient facial edema is  expected.  The patient has been advised that her expected hospital stay  will be approximately 1 day and recovery time 3-4 weeks.  The patient  was given a MiraLax bowel prep to be administered the day prior to her  surgery.  The patient has therefore consented to proceed with a robotic  assisted hysterectomy at [facility] June 06, 2006  at 7:30 a.m.      [place]mira J. Adline Peals.      Crist Fat Rivard, M.D.  Electronically Signed    EJP/MEDQ  D:  06/02/2006  T:  06/02/2006  Job:  [id number]

## 2010-10-31 ENCOUNTER — Other Ambulatory Visit: Payer: Self-pay

## 2010-11-02 ENCOUNTER — Encounter: Payer: Self-pay | Admitting: Family Medicine

## 2010-11-02 ENCOUNTER — Other Ambulatory Visit (INDEPENDENT_AMBULATORY_CARE_PROVIDER_SITE_OTHER): Payer: 59 | Admitting: Family Medicine

## 2010-11-02 DIAGNOSIS — E039 Hypothyroidism, unspecified: Secondary | ICD-10-CM

## 2010-11-02 DIAGNOSIS — R5381 Other malaise: Secondary | ICD-10-CM

## 2010-11-02 DIAGNOSIS — Z13220 Encounter for screening for lipoid disorders: Secondary | ICD-10-CM

## 2010-11-02 LAB — BASIC METABOLIC PANEL
BUN: 13 mg/dL (ref 6–23)
CO2: 27 mEq/L (ref 19–32)
Calcium: 8.8 mg/dL (ref 8.4–10.5)
Chloride: 106 mEq/L (ref 96–112)
Creatinine, Ser: 0.7 mg/dL (ref 0.4–1.2)
GFR: 93.88 mL/min (ref >60.00)
Glucose, Bld: 108 mg/dL — ABNORMAL HIGH (ref 70–99)
Potassium: 4.5 mEq/L (ref 3.5–5.1)
Sodium: 139 mEq/L (ref 135–145)

## 2010-11-02 LAB — CBC WITH DIFFERENTIAL/PLATELET
Basophils Absolute: 0 10*3/uL (ref 0.0–0.1)
Basophils Relative: 0.5 % (ref 0.0–3.0)
Eosinophils Absolute: 0.5 10*3/uL (ref 0.0–0.7)
Eosinophils Relative: 7.2 % — ABNORMAL HIGH (ref 0.0–5.0)
HCT: 39 % (ref 36.0–46.0)
Hemoglobin: 13.2 g/dL (ref 12.0–15.0)
Lymphocytes Relative: 25.7 % (ref 12.0–46.0)
Lymphs Abs: 1.6 10*3/uL (ref 0.7–4.0)
MCHC: 34 g/dL (ref 30.0–36.0)
MCV: 91.7 fl (ref 78.0–100.0)
Monocytes Absolute: 0.5 10*3/uL (ref 0.1–1.0)
Monocytes Relative: 7.4 % (ref 3.0–12.0)
Neutro Abs: 3.8 10*3/uL (ref 1.4–7.7)
Neutrophils Relative %: 59.2 % (ref 43.0–77.0)
Platelets: 249 10*3/uL (ref 150.0–400.0)
RBC: 4.25 Mil/uL (ref 3.87–5.11)
RDW: 13.3 % (ref 11.5–14.6)
WBC: 6.4 10*3/uL (ref 4.5–10.5)

## 2010-11-02 LAB — LIPID PANEL
Cholesterol: 161 mg/dL (ref 0–200)
HDL: 38.5 mg/dL — ABNORMAL LOW (ref >39.00)
LDL Cholesterol: 111 mg/dL — ABNORMAL HIGH (ref 0–99)
Total CHOL/HDL Ratio: 4
Triglycerides: 60 mg/dL (ref 0.0–149.0)
VLDL: 12 mg/dL (ref 0.0–40.0)

## 2010-11-02 LAB — HEPATIC FUNCTION PANEL
ALT: 22 U/L (ref 0–35)
AST: 24 U/L (ref 0–37)
Albumin: 3.8 g/dL (ref 3.5–5.2)
Alkaline Phosphatase: 68 U/L (ref 39–117)
Bilirubin, Direct: 0.1 mg/dL (ref 0.0–0.3)
Total Bilirubin: 0.7 mg/dL (ref 0.3–1.2)
Total Protein: 6.4 g/dL (ref 6.0–8.3)

## 2010-11-02 LAB — TSH: TSH: 4.45 u[IU]/mL (ref 0.35–5.50)

## 2010-11-05 ENCOUNTER — Encounter: Payer: Self-pay | Admitting: *Deleted

## 2010-11-05 ENCOUNTER — Encounter: Payer: Self-pay | Admitting: Family Medicine

## 2010-11-05 ENCOUNTER — Ambulatory Visit (INDEPENDENT_AMBULATORY_CARE_PROVIDER_SITE_OTHER): Payer: 59 | Admitting: Family Medicine

## 2010-11-05 VITALS — BP 130/90 | HR 93 | Temp 98.6°F | Ht 64.0 in | Wt 228.8 lb

## 2010-11-05 DIAGNOSIS — R002 Palpitations: Secondary | ICD-10-CM | POA: Insufficient documentation

## 2010-11-05 DIAGNOSIS — E785 Hyperlipidemia, unspecified: Secondary | ICD-10-CM

## 2010-11-05 DIAGNOSIS — Z0000 Encounter for general adult medical examination without abnormal findings: Secondary | ICD-10-CM

## 2010-11-05 DIAGNOSIS — E039 Hypothyroidism, unspecified: Secondary | ICD-10-CM

## 2010-11-05 DIAGNOSIS — I10 Essential (primary) hypertension: Secondary | ICD-10-CM

## 2010-11-05 NOTE — Patient Instructions (Addendum)
Ask Dr. Layne Benton about paps  Work on your diet: Try to eat minimal fatty food and eat more fruits and vegetables. Anything fried is bad, cream, beef and pork fat are bad.  Exercise is incredibly important to heart health. Try to exercise for at least 30 minutes 5 - 6 times a week   OK to stop your Mobic and Flexeril Regular sleep and exercise, help a lot with pain

## 2010-11-05 NOTE — Progress Notes (Signed)
  Breast / Mammo - past due.  Hysterectomy from bleeding - still getting Paps annually Colonoscopy - 2005 had a colonoscopy, was having some IBS symptoms  Has a lot of itching. Will get some lood occ.   Tdap - 10 years Hyster - 06  Palpitations? Intermittent, fluttering in chest, no pain, no sob  Health Maintenance Summary Reviewed and updated, unless pt declines services.  Tobacco History Reviewed. Non-smoker Alcohol: No concerns, no excessive use Exercise Habits: Some activity, rec at least 30 mins 5 times a week STD concerns: none Drug Use: None Birth control method: Hyster Menses regular: yes Lumps or breast concerns: no Breast Cancer Family History: no  Health Maintenance  Topic Date Due  . Mammogram  02/21/2010  . Influenza Vaccine  02/04/2011  . Colonoscopy  12/05/2013  . Tetanus/tdap  05/06/2014    Labs reviewed with the patient.   Lipids:    Component Value Date/Time   CHOL 161 11/02/2010 0815   TRIG 60.0 11/02/2010 0815   HDL 38.50* 11/02/2010 0815   VLDL 12.0 11/02/2010 0815   CHOLHDL 4 11/02/2010 0815    CBC:    Component Value Date/Time   WBC 6.4 11/02/2010 0815   HGB 13.2 11/02/2010 0815   HCT 39.0 11/02/2010 0815   PLT 249.0 11/02/2010 0815   MCV 91.7 11/02/2010 0815   NEUTROABS 3.8 11/02/2010 0815   LYMPHSABS 1.6 11/02/2010 0815   MONOABS 0.5 11/02/2010 0815   EOSABS 0.5 11/02/2010 0815   BASOSABS 0.0 11/02/2010 0815    Basic Metabolic Panel:    Component Value Date/Time   NA 139 11/02/2010 0815   K 4.5 11/02/2010 0815   CL 106 11/02/2010 0815   CO2 27 11/02/2010 0815   BUN 13 11/02/2010 0815   CREATININE 0.7 11/02/2010 0815   GLUCOSE 108* 11/02/2010 0815   CALCIUM 8.8 11/02/2010 0815    Lab Results  Component Value Date   ALT 22 11/02/2010   AST 24 11/02/2010   ALKPHOS 68 11/02/2010   BILITOT 0.7 11/02/2010   General: Denies fever, chills, sweats. No significant weight loss. Eyes: Denies blurring,significant itching ENT: Denies earache, sore throat,  and hoarseness.  Cardiovascular: Denies chest pains, palpitations, dyspnea on exertion,  Respiratory: Denies cough, dyspnea at rest,wheeezing Breast: no concerns about lumps GI: occ blood in stool, occ diarrhea and constip GU: Denies dysuria, hematuria, urinary hesitancy, nocturia, denies STD risk, no concerns about discharge Musculoskeletal: Denies back pain, joint pain Derm: Denies rash, itching Neuro: Denies  paresthesias, frequent falls, frequent headaches Psych: Denies depression, anxiety Endocrine: Denies cold intolerance, heat intolerance, polydipsia Heme: Denies enlarged lymph nodes Allergy: No hayfever  Assessment and Plan: 1.  The patient's preventative maintenance and recommended screening tests for an annual wellness exam were reviewed in full today. Brought up to date unless services declined.  Counselled on the importance of diet, exercise, and its role in overall health and mortality. The patient's FH and SH was reviewed, including their home life, tobacco status, and drug and alcohol status.  2. Palpitations: check EKG EKG: Normal sinus rhythm. Normal axis, normal R wave progression, No acute ST elevation or depression.  3. HTN, stable 4. Lipids, stable 5. Prediabetes:   Try to eat minimal fatty food and eat more fruits and vegetables. Anything fried is bad, cream, beef and pork fat are bad.  Exercise is incredibly important to heart health. Try to exercise for at least 30 minutes 5 - 6 times a week

## 2010-11-09 ENCOUNTER — Other Ambulatory Visit: Payer: Self-pay | Admitting: *Deleted

## 2010-11-09 MED ORDER — LOSARTAN POTASSIUM-HCTZ 50-12.5 MG PO TABS: 1.0000 | ORAL_TABLET | Freq: Every day | ORAL | Status: DC

## 2010-11-09 MED ORDER — OMEPRAZOLE 20 MG PO CPDR: 20.0000 mg | DELAYED_RELEASE_CAPSULE | Freq: Every day | ORAL | Status: DC

## 2010-11-12 ENCOUNTER — Telehealth: Payer: Self-pay | Admitting: *Deleted

## 2010-11-12 NOTE — Telephone Encounter (Signed)
Pt was told to stop taking flexeril and mobic but she says she needs to take these for her arthritis.  She says that pain has gotten worse since she stopped these meds.  She is asking if ok for her to continue these or do you want to try her on something different.  Please advise.  [facility].

## 2010-11-12 NOTE — Telephone Encounter (Signed)
Call  She can resume them. She wanted to try going off them, but very reasonable to continue.

## 2010-11-12 NOTE — Telephone Encounter (Signed)
Patient advised.

## 2010-11-29 ENCOUNTER — Telehealth: Payer: Self-pay

## 2010-11-29 MED ORDER — SUMATRIPTAN SUCCINATE 100 MG PO TABS: ORAL_TABLET | ORAL | Status: DC

## 2010-11-29 NOTE — Telephone Encounter (Signed)
If headaches not improving on imitrex.. Make appt to be seen.

## 2010-11-29 NOTE — Telephone Encounter (Signed)
Pt calls has had h/a everyday this week been taking ibuprofen 800mg  and getting relief until yesterday. Pt got headache again yesterday and still has same h/a Pain over lt eye and temple area. Ibuprofen has not helped this time. Pt has nausea but has not vomited. Pt has hx of migraine. Would like different med called to [facility] to break cycle of headaches. Pt understands due to lateness of day may not receive call back today. Contact # [phone]. Pt uses [facility].Please advise.

## 2010-11-29 NOTE — Telephone Encounter (Signed)
Patient's husband notified as instructed by telephone.  

## 2010-12-03 ENCOUNTER — Other Ambulatory Visit: Payer: Self-pay | Admitting: *Deleted

## 2010-12-03 MED ORDER — LEVOTHYROXINE SODIUM 50 MCG PO TABS: 50.0000 ug | ORAL_TABLET | Freq: Every day | ORAL | Status: DC

## 2011-01-01 ENCOUNTER — Other Ambulatory Visit: Payer: Self-pay | Admitting: *Deleted

## 2011-01-01 MED ORDER — CYCLOBENZAPRINE HCL 5 MG PO TABS: 5.0000 mg | ORAL_TABLET | Freq: Three times a day (TID) | ORAL | Status: AC | PRN

## 2011-01-01 MED ORDER — BUPROPION HCL ER (XL) 300 MG PO TB24: 300.0000 mg | ORAL_TABLET | Freq: Every day | ORAL | Status: DC

## 2011-01-01 NOTE — Telephone Encounter (Signed)
done

## 2011-01-31 ENCOUNTER — Other Ambulatory Visit: Payer: Self-pay | Admitting: *Deleted

## 2011-01-31 MED ORDER — SUMATRIPTAN SUCCINATE 100 MG PO TABS: ORAL_TABLET | ORAL | Status: DC

## 2011-01-31 MED ORDER — MELOXICAM 7.5 MG PO TABS: 7.5000 mg | ORAL_TABLET | Freq: Every day | ORAL | Status: DC

## 2011-01-31 NOTE — Telephone Encounter (Signed)
Ok to refill with 5 refills each

## 2011-02-08 ENCOUNTER — Other Ambulatory Visit: Payer: Self-pay | Admitting: Family Medicine

## 2011-02-08 DIAGNOSIS — Z1231 Encounter for screening mammogram for malignant neoplasm of breast: Secondary | ICD-10-CM

## 2011-03-14 ENCOUNTER — Ambulatory Visit
Admission: RE | Admit: 2011-03-14 | Discharge: 2011-03-14 | Disposition: A | Discharge status: Home / Self Care | Payer: 59 | Source: Ambulatory Visit | Attending: Family Medicine | Admitting: Family Medicine

## 2011-03-14 DIAGNOSIS — Z1231 Encounter for screening mammogram for malignant neoplasm of breast: Secondary | ICD-10-CM

## 2011-03-18 ENCOUNTER — Encounter: Payer: Self-pay | Admitting: *Deleted

## 2011-05-22 ENCOUNTER — Encounter: Payer: Self-pay | Admitting: Family Medicine

## 2011-05-22 ENCOUNTER — Ambulatory Visit (INDEPENDENT_AMBULATORY_CARE_PROVIDER_SITE_OTHER): Payer: 59 | Admitting: Family Medicine

## 2011-05-22 VITALS — BP 120/72 | HR 107 | Temp 98.8°F | Ht 63.0 in | Wt 221.1 lb

## 2011-05-22 DIAGNOSIS — M25519 Pain in unspecified shoulder: Secondary | ICD-10-CM

## 2011-05-22 DIAGNOSIS — M25511 Pain in right shoulder: Secondary | ICD-10-CM

## 2011-05-22 DIAGNOSIS — M5412 Radiculopathy, cervical region: Secondary | ICD-10-CM

## 2011-05-22 MED ORDER — PREDNISONE 20 MG PO TABS: ORAL_TABLET | ORAL | Status: DC

## 2011-05-22 NOTE — Patient Instructions (Signed)
F/u 5 weeks

## 2011-05-22 NOTE — Progress Notes (Signed)
The patient noted above presents with shoulder pain that has been ongoing since thanksgiving. there is of her falling then The patient also c/o some shoulder blade pain and tingling B arms Denies dislocation, subluxation, separation of the shoulder. The patient does complain of pain in the overhead plane with significant painful arc of motion b.  Medications Tried: Tylenol, NSAIDS Ice or Heat: minimally helpful Tried PT: No (+ in past -- doing HEP, classic EROM / IROM)  Prior shoulder Injury: R SAD, partial cuff repair Prior fracture: No  The PMH, PSH, Social History, Family History, Medications, and allergies have been reviewed in [facility], and have been updated if relevant.  REVIEW OF SYSTEMS  GEN: No fevers, chills. Nontoxic. Primarily MSK c/o today. MSK: Detailed in the HPI GI: tolerating PO intake without difficulty Neuro: No numbness, parasthesias, or tingling associated. Otherwise the pertinent positives of the ROS are noted above.   PHYSICAL EXAM  Blood pressure 120/72, pulse 107, temperature 98.8 F (37.1 C), temperature source Oral, height 5\' 3"  (1.6 m), weight 221 lb 1.9 oz (100.299 kg), SpO2 99.00%.  GEN: Well-developed,well-nourished,in no acute distress; alert,appropriate and cooperative throughout examination HEENT: Normocephalic and atraumatic without obvious abnormalities. Ears, externally no deformities PULM: Breathing comfortably in no respiratory distress EXT: No clubbing, cyanosis, or edema PSYCH: Normally interactive. Cooperative during the interview. Pleasant. Friendly and conversant. Not anxious or depressed appearing. Normal, full affect.  Shoulder: Inspection: No muscle wasting or winging Ecchymosis/edema: neg  AC joint, scapula, clavicle: NT Cervical spine: NT, full ROM Spurling's: neg Abduction: full, 5/5, some pain Flexion: full, 5/5 IR, full, lift-off: 5/5, some pain ER at neutral: full, 5/5, some pain AC crossover: mild pos Neer: pos Hawkins:  pos Drop Test: neg Empty Can: pos Supraspinatus insertion: mild-mod T Bicipital groove: NT Speed's: neg Yergason's: neg Sulcus sign: neg Scapular dyskinesis: none C5-T1 intact  Neuro: Sensation intact Grip 5/5    1. Rotator Cuff tendinopathy, subac bursitis, mild ac irritation:  Rotator cuff strengthening and scapular stabilization exercises were reviewed with the patient.  [facility] RTC and scapular stabilization program given to the patient. Retraining shoulder mechanics and function was emphasized to the patient with rehab done at least 5-6 days a week.  The patient could benefit from formal PT to assist with scapular stabilization and RTC strengthening.  Oral prednisone, increase flexeril

## 2011-06-26 ENCOUNTER — Ambulatory Visit: Payer: 59 | Admitting: Family Medicine

## 2011-06-26 DIAGNOSIS — Z0289 Encounter for other administrative examinations: Secondary | ICD-10-CM

## 2011-06-28 ENCOUNTER — Other Ambulatory Visit: Payer: Self-pay

## 2011-06-28 MED ORDER — CYCLOBENZAPRINE HCL 5 MG PO TABS: 5.0000 mg | ORAL_TABLET | Freq: Three times a day (TID) | ORAL | Status: DC | PRN

## 2011-06-28 NOTE — Telephone Encounter (Signed)
done

## 2011-06-28 NOTE — Telephone Encounter (Signed)
Received fax refill request from patients pharmacy.  Okay to refill? 

## 2011-07-29 ENCOUNTER — Ambulatory Visit (INDEPENDENT_AMBULATORY_CARE_PROVIDER_SITE_OTHER): Payer: 59 | Admitting: Obstetrics and Gynecology

## 2011-07-29 DIAGNOSIS — Z01419 Encounter for gynecological examination (general) (routine) without abnormal findings: Secondary | ICD-10-CM

## 2011-08-05 ENCOUNTER — Other Ambulatory Visit: Payer: Self-pay | Admitting: *Deleted

## 2011-08-05 MED ORDER — LEVOTHYROXINE SODIUM 50 MCG PO TABS: 50.0000 ug | ORAL_TABLET | Freq: Every day | ORAL | Status: DC

## 2011-08-05 MED ORDER — MELOXICAM 7.5 MG PO TABS: 7.5000 mg | ORAL_TABLET | Freq: Every day | ORAL | Status: DC

## 2011-08-05 NOTE — Telephone Encounter (Signed)
Patient last seen 05-2011 is this okay to fill?

## 2011-10-30 ENCOUNTER — Encounter: Payer: Self-pay | Admitting: Family Medicine

## 2011-10-30 ENCOUNTER — Ambulatory Visit (INDEPENDENT_AMBULATORY_CARE_PROVIDER_SITE_OTHER): Payer: 59 | Admitting: Family Medicine

## 2011-10-30 VITALS — BP 130/72 | HR 96 | Temp 98.3°F | Ht 63.0 in | Wt 231.0 lb

## 2011-10-30 DIAGNOSIS — R609 Edema, unspecified: Secondary | ICD-10-CM

## 2011-10-30 DIAGNOSIS — I8390 Asymptomatic varicose veins of unspecified lower extremity: Secondary | ICD-10-CM

## 2011-10-30 NOTE — Progress Notes (Signed)
   Nature conservation officer at [facility] [address] [place] [address] Phone: [phone] Fax: [phone]   Patient Name: Kristine Curtis Date of Birth: August 08, 1960 Age: 51 y.o. Medical Record Number: [id number] Gender: female Date of Encounter: 10/30/2011  History of Present Illness:  Kristine Curtis is a 51 y.o. very pleasant female patient who presents with the following:  Changing jobs at work, from something high stress at work, now requiring standing for 10 hours.   Has some leg varicosities and occ will get some swelling in the LE. Wanted to check her blood flow to her legs and my opinion on support hose  Past Medical History, Surgical History, Social History, Family History, Problem List, Medications, and Allergies have been reviewed and updated if relevant.  Prior to Admission medications   Medication Sig Start Date End Date Taking? Authorizing Provider  buPROPion (WELLBUTRIN XL) 300 MG 24 hr tablet Take 1 tablet (300 mg total) by mouth daily. 01/01/11  Yes [name], MD  clindamycin (CLEOCIN T) 1 % lotion  07/31/10  Yes Historical Provider, MD  cyclobenzaprine (FLEXERIL) 5 MG tablet Take 1 tablet (5 mg total) by mouth 3 (three) times daily as needed for muscle spasms. 06/28/11  Yes Charleston Vierling, MD  cycloSPORINE (RESTASIS) 0.05 % ophthalmic emulsion 1 drop 2 (two) times daily.   Yes Historical Provider, MD  ibuprofen (ADVIL,MOTRIN) 800 MG tablet Take 800 mg by mouth. At the onset of headache    Yes Historical Provider, MD  levothyroxine (SYNTHROID, LEVOTHROID) 50 MCG tablet Take 1 tablet (50 mcg total) by mouth daily. 08/05/11  Yes Merced Brougham, MD  losartan-hydrochlorothiazide (HYZAAR) 50-12.5 MG per tablet Take 1 tablet by mouth daily. 11/09/10  Yes Amy Michelle Nasuti, MD  meloxicam (MOBIC) 7.5 MG tablet Take 1 tablet (7.5 mg total) by mouth daily. 08/05/11 08/04/12 Yes Shyheim Tanney, MD  Multiple Vitamin (MULTIVITAMIN) tablet Take 1 tablet by mouth daily.     Yes  Historical Provider, MD  omeprazole (PRILOSEC) 20 MG capsule Take 1 capsule (20 mg total) by mouth daily. 11/09/10  Yes [name], MD  SUMAtriptan (IMITREX) 100 MG tablet Take one tablet for migraine, if not improved in 2 hours may repeat, but no more than 200 mg in 24 hours. 01/31/11  Yes [name], MD    Review of Systems:  GEN: No acute illnesses, no fevers, chills. GI: No n/v/d, eating normally Pulm: No SOB Interactive and getting along well at home.  Otherwise, ROS is as per the HPI.   Physical Examination: Filed Vitals:   10/30/11 0824  BP: 130/72  Pulse: 96  Temp: 98.3 F (36.8 C)   Filed Vitals:   10/30/11 0824  Height: 5\' 3"  (1.6 m)  Weight: 231 lb (104.781 kg)   Body mass index is 40.92 kg/(m^2). Ideal Body Weight: Weight in (lb) to have BMI = 25: 140.8    GEN: WDWN, NAD, Non-toxic, Alert & Oriented x 3 HEENT: Atraumatic, Normocephalic.  Ears and Nose: No external deformity. EXTR: No clubbing/cyanosis/edema. DP and PT pulses 2+. Some scattered varicose and spider veins, B LE NEURO: Normal gait.  PSYCH: Normally interactive. Conversant. Not depressed or anxious appearing.  Calm demeanor.    Assessment and Plan: 1. Edema   2. Varicose vein     Intermittent edema. No arterial dysfunction Varicosities - support hose a good idea if on feet  Written script for 15 mm Hg support hose  [name], MD

## 2011-10-31 ENCOUNTER — Other Ambulatory Visit: Payer: Self-pay | Admitting: Family Medicine

## 2012-01-01 ENCOUNTER — Other Ambulatory Visit: Payer: Self-pay | Admitting: *Deleted

## 2012-01-01 MED ORDER — CYCLOBENZAPRINE HCL 5 MG PO TABS: 5.0000 mg | ORAL_TABLET | Freq: Three times a day (TID) | ORAL | Status: DC | PRN

## 2012-01-01 MED ORDER — BUPROPION HCL ER (XL) 300 MG PO TB24: 300.0000 mg | ORAL_TABLET | Freq: Every day | ORAL | Status: DC

## 2012-01-01 NOTE — Telephone Encounter (Signed)
Faxed refill request from [facility], last filled 11/26/11.

## 2012-01-01 NOTE — Telephone Encounter (Addendum)
OK to refill? Has not been seen in over 1 year for this med.     I refilled for a few months, but can you remind her to come in for a physical in the next few months.   [name], MD 01/01/2012, 5:43 PM

## 2012-01-03 NOTE — Telephone Encounter (Signed)
Left message for pt to call back  °

## 2012-01-07 NOTE — Telephone Encounter (Signed)
Advised patient that medicine was called to pharmacy and that she will need physical before further refills.  Appointment scheduled for October.

## 2012-01-30 ENCOUNTER — Other Ambulatory Visit: Payer: Self-pay

## 2012-01-30 NOTE — Telephone Encounter (Signed)
Refill request for Meloxicam 7.5 mg. Ok to refill? 

## 2012-01-31 MED ORDER — MELOXICAM 7.5 MG PO TABS: 7.5000 mg | ORAL_TABLET | Freq: Every day | ORAL | Status: DC

## 2012-01-31 NOTE — Telephone Encounter (Signed)
Meloxicam 7.5 mg #30 5 R called in to [facility] pharmacy.

## 2012-02-06 ENCOUNTER — Other Ambulatory Visit (INDEPENDENT_AMBULATORY_CARE_PROVIDER_SITE_OTHER): Payer: 59

## 2012-02-06 DIAGNOSIS — E785 Hyperlipidemia, unspecified: Secondary | ICD-10-CM

## 2012-02-06 DIAGNOSIS — E039 Hypothyroidism, unspecified: Secondary | ICD-10-CM

## 2012-02-06 DIAGNOSIS — E559 Vitamin D deficiency, unspecified: Secondary | ICD-10-CM

## 2012-02-06 DIAGNOSIS — Z79899 Other long term (current) drug therapy: Secondary | ICD-10-CM

## 2012-02-06 LAB — CBC WITH DIFFERENTIAL/PLATELET
Basophils Absolute: 0 10*3/uL (ref 0.0–0.1)
Basophils Relative: 0.5 % (ref 0.0–3.0)
Eosinophils Absolute: 0.3 10*3/uL (ref 0.0–0.7)
Eosinophils Relative: 4.2 % (ref 0.0–5.0)
HCT: 35.2 % — ABNORMAL LOW (ref 36.0–46.0)
Hemoglobin: 11.8 g/dL — ABNORMAL LOW (ref 12.0–15.0)
Lymphocytes Relative: 30.5 % (ref 12.0–46.0)
Lymphs Abs: 2.5 10*3/uL (ref 0.7–4.0)
MCHC: 33.5 g/dL (ref 30.0–36.0)
MCV: 89.7 fl (ref 78.0–100.0)
Monocytes Absolute: 0.5 10*3/uL (ref 0.1–1.0)
Monocytes Relative: 5.6 % (ref 3.0–12.0)
Neutro Abs: 4.8 10*3/uL (ref 1.4–7.7)
Neutrophils Relative %: 59.2 % (ref 43.0–77.0)
Platelets: 281 10*3/uL (ref 150.0–400.0)
RBC: 3.92 Mil/uL (ref 3.87–5.11)
RDW: 14.3 % (ref 11.5–14.6)
WBC: 8.2 10*3/uL (ref 4.5–10.5)

## 2012-02-06 LAB — HEPATIC FUNCTION PANEL
ALT: 28 U/L (ref 0–35)
AST: 25 U/L (ref 0–37)
Albumin: 3.4 g/dL — ABNORMAL LOW (ref 3.5–5.2)
Alkaline Phosphatase: 76 U/L (ref 39–117)
Bilirubin, Direct: 0.1 mg/dL (ref 0.0–0.3)
Total Bilirubin: 0.5 mg/dL (ref 0.3–1.2)
Total Protein: 6.4 g/dL (ref 6.0–8.3)

## 2012-02-06 LAB — LIPID PANEL
Cholesterol: 163 mg/dL (ref 0–200)
HDL: 38.4 mg/dL — ABNORMAL LOW (ref >39.00)
LDL Cholesterol: 96 mg/dL (ref 0–99)
Total CHOL/HDL Ratio: 4
Triglycerides: 144 mg/dL (ref 0.0–149.0)
VLDL: 28.8 mg/dL (ref 0.0–40.0)

## 2012-02-06 LAB — BASIC METABOLIC PANEL
BUN: 9 mg/dL (ref 6–23)
CO2: 27 mEq/L (ref 19–32)
Calcium: 8.4 mg/dL (ref 8.4–10.5)
Chloride: 105 mEq/L (ref 96–112)
Creatinine, Ser: 0.9 mg/dL (ref 0.4–1.2)
GFR: 73.66 mL/min (ref >60.00)
Glucose, Bld: 122 mg/dL — ABNORMAL HIGH (ref 70–99)
Potassium: 3.3 mEq/L — ABNORMAL LOW (ref 3.5–5.1)
Sodium: 138 mEq/L (ref 135–145)

## 2012-02-06 LAB — TSH: TSH: 5.63 u[IU]/mL — ABNORMAL HIGH (ref 0.35–5.50)

## 2012-02-07 LAB — VITAMIN D 25 HYDROXY (VIT D DEFICIENCY, FRACTURES): Vit D, 25-Hydroxy: 46 ng/mL (ref 30–89)

## 2012-02-13 ENCOUNTER — Encounter: Payer: 59 | Admitting: Family Medicine

## 2012-02-24 ENCOUNTER — Encounter: Payer: Self-pay | Admitting: Family Medicine

## 2012-02-24 ENCOUNTER — Ambulatory Visit (INDEPENDENT_AMBULATORY_CARE_PROVIDER_SITE_OTHER): Payer: 59 | Admitting: Family Medicine

## 2012-02-24 VITALS — BP 126/82 | HR 88 | Temp 98.2°F | Ht 64.0 in | Wt 231.8 lb

## 2012-02-24 DIAGNOSIS — Z0000 Encounter for general adult medical examination without abnormal findings: Secondary | ICD-10-CM

## 2012-02-24 DIAGNOSIS — E039 Hypothyroidism, unspecified: Secondary | ICD-10-CM

## 2012-02-24 MED ORDER — HYDROCORTISONE 2.5 % RE CREA: TOPICAL_CREAM | Freq: Two times a day (BID) | RECTAL | Status: DC

## 2012-02-24 MED ORDER — LEVOTHYROXINE SODIUM 75 MCG PO TABS: 75.0000 ug | ORAL_TABLET | Freq: Every day | ORAL | Status: DC

## 2012-02-24 NOTE — Patient Instructions (Addendum)
Recheck thyroid blood draw in 2-3 months

## 2012-02-24 NOTE — Progress Notes (Signed)
Nature conservation officer at [facility] [address] [place] [address] Phone: [phone] Fax: [phone]  Date:  02/24/2012   Name:  Kristine Curtis   DOB:  [birth year]   MRN:  [id number] Gender: female Age: 51 y.o.  PCP:  [name], MD  Evaluating MD: [name], MD   Chief Complaint: Annual Exam   History of Present Illness:  Kristine Curtis is a 51 y.o. pleasant patient who presents with the following:   CPX:  For 3 or 4 weeks has een a little bit stuff and will come back.  Hemorrhoids: has tried the over the counter stuff. Itchy / stingy.   Hypothyroid, she has been feeling more sluggish, or harassment dry, and she has been gaining some weight recently. Lab Results  Component Value Date   TSH 5.63* 02/06/2012   Health Maintenance Summary Reviewed and updated, unless pt declines services.  Tobacco History Reviewed. Non-smoker Alcohol: No concerns, no excessive use Exercise Habits: none STD concerns: none Drug Use: None Birth control method: hyst Menses regular: yes Lumps or breast concerns: no  Health Maintenance  Topic Date Due  . Influenza Vaccine  01/05/2012  . Mammogram  03/13/2013  . Colonoscopy  12/05/2013  . Tetanus/tdap  05/06/2014    Labs reviewed with the patient.  Results for orders placed in visit on 02/06/12  LIPID PANEL      Component Value Range   Cholesterol 163  0 - 200 mg/dL   Triglycerides 952.8  0.0 - 149.0 mg/dL   HDL 41.32 (*) >44.01 mg/dL   VLDL 02.7  0.0 - 25.3 mg/dL   LDL Cholesterol 96  0 - 99 mg/dL   Total CHOL/HDL Ratio 4    CBC WITH DIFFERENTIAL      Component Value Range   WBC 8.2  4.5 - 10.5 K/uL   RBC 3.92  3.87 - 5.11 Mil/uL   Hemoglobin 11.8 (*) 12.0 - 15.0 g/dL   HCT 66.4 (*) 40.3 - 47.4 %   MCV 89.7  78.0 - 100.0 fl   MCHC 33.5  30.0 - 36.0 g/dL   RDW 25.9  56.3 - 87.5 %   Platelets 281.0  150.0 - 400.0 K/uL   Neutrophils Relative 59.2  43.0 - 77.0 %   Lymphocytes Relative 30.5  12.0 - 46.0  %   Monocytes Relative 5.6  3.0 - 12.0 %   Eosinophils Relative 4.2  0.0 - 5.0 %   Basophils Relative 0.5  0.0 - 3.0 %   Neutro Abs 4.8  1.4 - 7.7 K/uL   Lymphs Abs 2.5  0.7 - 4.0 K/uL   Monocytes Absolute 0.5  0.1 - 1.0 K/uL   Eosinophils Absolute 0.3  0.0 - 0.7 K/uL   Basophils Absolute 0.0  0.0 - 0.1 K/uL  HEPATIC FUNCTION PANEL      Component Value Range   Total Bilirubin 0.5  0.3 - 1.2 mg/dL   Bilirubin, Direct 0.1  0.0 - 0.3 mg/dL   Alkaline Phosphatase 76  39 - 117 U/L   AST 25  0 - 37 U/L   ALT 28  0 - 35 U/L   Total Protein 6.4  6.0 - 8.3 g/dL   Albumin 3.4 (*) 3.5 - 5.2 g/dL  BASIC METABOLIC PANEL      Component Value Range   Sodium 138  135 - 145 mEq/L   Potassium 3.3 (*) 3.5 - 5.1 mEq/L   Chloride 105  96 - 112 mEq/L  CO2 27  19 - 32 mEq/L   Glucose, Bld 122 (*) 70 - 99 mg/dL   BUN 9  6 - 23 mg/dL   Creatinine, Ser 0.9  0.4 - 1.2 mg/dL   Calcium 8.4  8.4 - 16.1 mg/dL   GFR 09.60  >45.40 mL/min  TSH      Component Value Range   TSH 5.63 (*) 0.35 - 5.50 uIU/mL  VITAMIN D 25 HYDROXY      Component Value Range   Vit D, 25-Hydroxy 46  30 - 89 ng/mL     Patient Active Problem List  Diagnosis  . HYPOTHYROIDISM  . UNSPECIFIED VITAMIN D DEFICIENCY  . OTHER AND UNSPECIFIED HYPERLIPIDEMIA  . ANEMIA-NOS  . DEPRESSION  . MIGRAINE HEADACHE  . HYPERTENSION  . ALLERGIC RHINITIS  . ASTHMA  . GERD  . OSTEOARTHRITIS  . FIBROMYALGIA  . FATIGUE  . Heart palpitations    Past Medical History  Diagnosis Date  . Allergic rhinitis due to pollen   . Anemia   . Fibromyalgia   . Cardiomyopathy   . Asthma   . Depression   . GERD (gastroesophageal reflux disease)   . Headache   . Hypertension   . Hypothyroidism   . Osteoarthritis   . Restless leg syndrome   . Vertigo   . IBS (irritable bowel syndrome)   . Cholecystitis     Past Surgical History  Procedure Date  . Abdominal hysterectomy   . Cesarean section   . Tubal ligation 1992  . Cholecystectomy 10-2008   . Rotator cuff repair     History  Substance Use Topics  . Smoking status: Former Games developer  . Smokeless tobacco: Never Used   Comment: quit smoking 1990  . Alcohol Use: No    No family history on file.  Allergies  Allergen Reactions  . Codeine   . Sulfonamide Derivatives     Medication list has been reviewed and updated.  Outpatient Prescriptions Prior to Visit  Medication Sig Dispense Refill  . buPROPion (WELLBUTRIN XL) 300 MG 24 hr tablet Take 1 tablet (300 mg total) by mouth daily.  30 tablet  3  . cyclobenzaprine (FLEXERIL) 5 MG tablet Take 1 tablet (5 mg total) by mouth 3 (three) times daily as needed for muscle spasms.  30 tablet  5  . cycloSPORINE (RESTASIS) 0.05 % ophthalmic emulsion 1 drop 2 (two) times daily.      Marland Kitchen ibuprofen (ADVIL,MOTRIN) 800 MG tablet Take 800 mg by mouth. At the onset of headache       . levothyroxine (SYNTHROID, LEVOTHROID) 50 MCG tablet Take 1 tablet (50 mcg total) by mouth daily.  30 tablet  6  . losartan-hydrochlorothiazide (HYZAAR) 50-12.5 MG per tablet TAKE ONE (1) TABLET BY MOUTH EVERY      DAY  30 tablet  6  . meloxicam (MOBIC) 7.5 MG tablet Take 1 tablet (7.5 mg total) by mouth daily.  30 tablet  5  . Multiple Vitamin (MULTIVITAMIN) tablet Take 1 tablet by mouth daily.        Marland Kitchen omeprazole (PRILOSEC) 20 MG capsule TAKE ONE CAPSULE BY MOUTH DAILY  30 capsule  6  . SUMAtriptan (IMITREX) 100 MG tablet Take one tablet for migraine, if not improved in 2 hours may repeat, but no more than 200 mg in 24 hours.  10 tablet  5  . clindamycin (CLEOCIN T) 1 % lotion         Review of Systems:   General:  Denies fever, chills, sweats. No significant weight loss - WEIGHT GAIN, FATIGUE. SOME BRITTLE HAIR. Eyes: Denies blurring,significant itching ENT: Denies earache, sore throat, and hoarseness.  Cardiovascular: Denies chest pains, palpitations, dyspnea on exertion,  Respiratory: Denies cough, dyspnea at rest,wheeezing Breast: no concerns about  lumps GI: Denies nausea, vomiting, diarrhea, constipation, change in bowel habits, abdominal pain, melena, hematochezia GU: Denies dysuria, hematuria, urinary hesitancy, nocturia, denies STD risk, no concerns about discharge Musculoskeletal: Denies back pain, joint pain Derm: Denies rash, itching Neuro: Denies  paresthesias, frequent falls, frequent headaches Psych: Denies depression, anxiety Endocrine: Denies cold intolerance, heat intolerance, polydipsia Heme: Denies enlarged lymph nodes Allergy: No hayfever   Physical Examination: Filed Vitals:   02/24/12 1232  BP: 126/82  Pulse: 88  Temp: 98.2 F (36.8 C)  TempSrc: Oral  Height: 5\' 4"  (1.626 m)  Weight: 231 lb 12 oz (105.121 kg)    Body mass index is 39.78 kg/(m^2). Ideal Body Weight: Weight in (lb) to have BMI = 25: 145.3    GEN: well developed, well nourished, no acute distress Eyes: conjunctiva and lids normal, PERRLA, EOMI ENT: TM clear, nares clear, oral exam WNL Neck: supple, no lymphadenopathy, no thyromegaly, no JVD Pulm: clear to auscultation and percussion, respiratory effort normal CV: regular rate and rhythm, S1-S2, no murmur, rub or gallop, no bruits Chest: no scars, masses, no lumps BREAST: breast exam declined GI: soft, non-tender; no hepatosplenomegaly, masses; active bowel sounds all quadrants GU: GU exam declined Lymph: no cervical, axillary or inguinal adenopathy MSK: gait normal, muscle tone and strength WNL, no joint swelling, effusions, discoloration, crepitus  SKIN: clear, good turgor, color WNL, no rashes, lesions, or ulcerations Neuro: normal mental status, normal strength, sensation, and motion Psych: alert; oriented to person, place and time, normally interactive and not anxious or depressed in appearance.   Assessment and Plan:  1. Routine general medical examination at a health care facility    2. Hypothyroid  TSH   The patient's preventative maintenance and recommended screening  tests for an annual wellness exam were reviewed in full today. Brought up to date unless services declined.  Counselled on the importance of diet, exercise, and its role in overall health and mortality. The patient's FH and SH was reviewed, including their home life, tobacco status, and drug and alcohol status.   Increase synthroid dose, recheck tsh in 2-3 mo  cpx 1 year  Orders Today:  Orders Placed This Encounter  Procedures  . TSH    Standing Status: Future     Number of Occurrences:      Standing Expiration Date: 02/23/2013    Updated Medication List: (Includes new medications, updates to list, dose adjustments) Meds ordered this encounter  Medications  . RESVERATROL PO    Sig: Take by mouth 2 (two) times daily.  . Omega 3-6-9 Fatty Acids (OMEGA 3-6-9 COMPLEX PO)    Sig: Take by mouth. Hydroeyes, take two by mouth daily  . levothyroxine (SYNTHROID, LEVOTHROID) 75 MCG tablet    Sig: Take 1 tablet (75 mcg total) by mouth daily.    Dispense:  30 tablet    Refill:  6  . hydrocortisone (ANUSOL-HC) 2.5 % rectal cream    Sig: Place rectally 2 (two) times daily.    Dispense:  30 g    Refill:  3    Medications Discontinued: Medications Discontinued During This Encounter  Medication Reason  . clindamycin (CLEOCIN T) 1 % lotion Completed Course  . levothyroxine (SYNTHROID, LEVOTHROID) 50 MCG  tablet Reorder     [name], MD

## 2012-02-25 ENCOUNTER — Encounter: Payer: Self-pay | Admitting: Family Medicine

## 2012-04-16 ENCOUNTER — Other Ambulatory Visit: Payer: Self-pay

## 2012-04-16 MED ORDER — IBUPROFEN 800 MG PO TABS: ORAL_TABLET | ORAL | Status: DC

## 2012-04-16 NOTE — Telephone Encounter (Signed)
Pt left v/m requestiing refill ibuprofen to [facility].Please advise.

## 2012-05-14 ENCOUNTER — Ambulatory Visit (INDEPENDENT_AMBULATORY_CARE_PROVIDER_SITE_OTHER): Payer: 59 | Admitting: Family Medicine

## 2012-05-14 ENCOUNTER — Encounter: Payer: Self-pay | Admitting: Family Medicine

## 2012-05-14 ENCOUNTER — Encounter: Payer: Self-pay | Admitting: *Deleted

## 2012-05-14 VITALS — BP 120/72 | HR 102 | Temp 98.3°F | Ht 64.0 in | Wt 232.8 lb

## 2012-05-14 DIAGNOSIS — J0100 Acute maxillary sinusitis, unspecified: Secondary | ICD-10-CM

## 2012-05-14 MED ORDER — AMOXICILLIN 500 MG PO CAPS: 1000.0000 mg | ORAL_CAPSULE | Freq: Two times a day (BID) | ORAL | Status: DC

## 2012-05-14 MED ORDER — HYDROCODONE-HOMATROPINE 5-1.5 MG/5ML PO SYRP: ORAL_SOLUTION | ORAL | Status: DC

## 2012-05-14 NOTE — Progress Notes (Signed)
Nature conservation officer at [facility] [address] [place] [address] Phone: [phone] Fax: [phone]  Date:  05/14/2012   Name:  Kristine Curtis   DOB:  Jun 23, 1960   MRN:  [id number] Gender: female Age: 52 y.o.  PCP:  [name], MD  Evaluating MD: [name], MD   Chief Complaint: URI   History of Present Illness:  Kristine Curtis is a 52 y.o. pleasant patient who presents with the following:  7+ days history of worsening: Horrible headache, dirty yellow colror in nose, and some blood on the left side. Ears are bothering her some and plugged up a lot. Chest is hurting and pain with coughing. Having some chills and sweating. + sinus pain and bad headache. Sinuses and frontal headache is the worst part of it.  No fever now, but has felt really hot, sweaty, and cold at home.   No n/v/d. No rash Ear fullness  Patient Active Problem List  Diagnosis  . HYPOTHYROIDISM  . UNSPECIFIED VITAMIN D DEFICIENCY  . OTHER AND UNSPECIFIED HYPERLIPIDEMIA  . ANEMIA-NOS  . DEPRESSION  . MIGRAINE HEADACHE  . HYPERTENSION  . ALLERGIC RHINITIS  . ASTHMA  . GERD  . OSTEOARTHRITIS  . FIBROMYALGIA  . FATIGUE  . Heart palpitations    Past Medical History  Diagnosis Date  . Allergic rhinitis due to pollen   . Anemia   . Fibromyalgia   . Cardiomyopathy   . Asthma   . Depression   . GERD (gastroesophageal reflux disease)   . Headache   . Hypertension   . Hypothyroidism   . Osteoarthritis   . Restless leg syndrome   . Vertigo   . IBS (irritable bowel syndrome)     Past Surgical History  Procedure Date  . Abdominal hysterectomy   . Cesarean section   . Tubal ligation 1992  . Cholecystectomy 10-2008  . Rotator cuff repair     History  Substance Use Topics  . Smoking status: Former Games developer  . Smokeless tobacco: Never Used     Comment: quit smoking 1990  . Alcohol Use: No    No family history on file.  Allergies  Allergen Reactions  . Codeine    . Sulfonamide Derivatives     Medication list has been reviewed and updated.  Outpatient Prescriptions Prior to Visit  Medication Sig Dispense Refill  . buPROPion (WELLBUTRIN XL) 300 MG 24 hr tablet Take 1 tablet (300 mg total) by mouth daily.  30 tablet  3  . cyclobenzaprine (FLEXERIL) 5 MG tablet Take 1 tablet (5 mg total) by mouth 3 (three) times daily as needed for muscle spasms.  30 tablet  5  . cycloSPORINE (RESTASIS) 0.05 % ophthalmic emulsion 1 drop 2 (two) times daily.      . hydrocortisone (ANUSOL-HC) 2.5 % rectal cream Place rectally 2 (two) times daily.  30 g  3  . ibuprofen (ADVIL,MOTRIN) 800 MG tablet Take 800 mg by mouth At the onset of headache  30 tablet  5  . levothyroxine (SYNTHROID, LEVOTHROID) 75 MCG tablet Take 1 tablet (75 mcg total) by mouth daily.  30 tablet  6  . losartan-hydrochlorothiazide (HYZAAR) 50-12.5 MG per tablet TAKE ONE (1) TABLET BY MOUTH EVERY      DAY  30 tablet  6  . meloxicam (MOBIC) 7.5 MG tablet Take 1 tablet (7.5 mg total) by mouth daily.  30 tablet  5  . Multiple Vitamin (MULTIVITAMIN) tablet Take 1 tablet by mouth  daily.        . Omega 3-6-9 Fatty Acids (OMEGA 3-6-9 COMPLEX PO) Take by mouth. Hydroeyes, take two by mouth daily      . omeprazole (PRILOSEC) 20 MG capsule TAKE ONE CAPSULE BY MOUTH DAILY  30 capsule  6  . RESVERATROL PO Take by mouth 2 (two) times daily.      . SUMAtriptan (IMITREX) 100 MG tablet Take one tablet for migraine, if not improved in 2 hours may repeat, but no more than 200 mg in 24 hours.  10 tablet  5   Last reviewed on 02/24/2012 12:41 PM by Sydell Axon, LPN  Review of Systems:  ROS: GEN: Acute illness details above GI: Tolerating PO intake GU: maintaining adequate hydration and urination Pulm: No SOB Interactive and getting along well at home.  Otherwise, ROS is as per the HPI.   Physical Examination: BP 120/72  Pulse 102  Temp 98.3 F (36.8 C) (Oral)  Ht 5\' 4"  (1.626 m)  Wt 232 lb 12.8 oz (105.597  kg)  BMI 39.96 kg/m2  SpO2 98%  Ideal Body Weight: Weight in (lb) to have BMI = 25: 145.3    Gen: WDWN, NAD; alert,appropriate and cooperative throughout exam  HEENT: Normocephalic and atraumatic. Throat clear, w/o exudate, no LAD, R TM clear, L TM - good landmarks, No fluid present. rhinnorhea.  Left frontal and maxillary sinuses: Tender, more in the max Right frontal and maxillary sinuses: Tender, more in the max  Neck: No ant or post LAD CV: RRR, No M/G/R Pulm: Breathing comfortably in no resp distress. no w/c/r Abd: S,NT,ND,+BS Extr: no c/c/e Psych: full affect, pleasant   Assessment and Plan:  1. Sinusitis, acute maxillary    Acute sinusitis: ABX as below.  Reviewed symptomatic care as well as ABX in this case.  Probable preceding URI, then progression to sinusitis  Orders Today:  No orders of the defined types were placed in this encounter.    Updated Medication List: (Includes new medications, updates to list, dose adjustments) Meds ordered this encounter  Medications  . HYDROcodone-homatropine (HYCODAN) 5-1.5 MG/5ML syrup    Sig: 1 tsp po at night before bed prn cough    Dispense:  240 mL    Refill:  0  . amoxicillin (AMOXIL) 500 MG capsule    Sig: Take 2 capsules (1,000 mg total) by mouth 2 (two) times daily.    Dispense:  40 capsule    Refill:  0    Medications Discontinued: There are no discontinued medications.   [name], MD

## 2012-05-18 ENCOUNTER — Telehealth: Payer: Self-pay | Admitting: Family Medicine

## 2012-05-18 MED ORDER — LEVOFLOXACIN 500 MG PO TABS: 500.0000 mg | ORAL_TABLET | Freq: Every day | ORAL | Status: DC

## 2012-05-18 NOTE — Telephone Encounter (Signed)
Levaquin 500 mg, 1 po daily, #10 

## 2012-05-18 NOTE — Telephone Encounter (Signed)
Pt left vm stating that she was seen in office on 05/14/12 for head/chest congestion.  She says that her symptoms have worsened and she wanted to know if a different medication could be called in for her rather than having to come in for another ov.

## 2012-05-18 NOTE — Telephone Encounter (Signed)
rx sent to pharmacy and patient advised. 

## 2012-05-20 ENCOUNTER — Telehealth: Payer: Self-pay

## 2012-05-20 NOTE — Telephone Encounter (Signed)
Pt left v/m requesting work excuse from 05/14/12 thru 05/19/12; pt states feels much better after taking antibiotic given on 05/18/12 and pt wants to return to work today. Call when work excuse ready for pick up.

## 2012-05-20 NOTE — Telephone Encounter (Signed)
Letter ready for pick up

## 2012-05-20 NOTE — Telephone Encounter (Signed)
Please help do this

## 2012-05-26 ENCOUNTER — Other Ambulatory Visit (INDEPENDENT_AMBULATORY_CARE_PROVIDER_SITE_OTHER): Payer: 59

## 2012-05-26 ENCOUNTER — Encounter: Payer: Self-pay | Admitting: *Deleted

## 2012-05-26 DIAGNOSIS — E039 Hypothyroidism, unspecified: Secondary | ICD-10-CM

## 2012-05-26 LAB — TSH: TSH: 4.18 u[IU]/mL (ref 0.35–5.50)

## 2012-06-02 ENCOUNTER — Other Ambulatory Visit: Payer: Self-pay | Admitting: *Deleted

## 2012-06-02 MED ORDER — LOSARTAN POTASSIUM-HCTZ 50-12.5 MG PO TABS: 1.0000 | ORAL_TABLET | Freq: Every day | ORAL | Status: DC

## 2012-06-02 MED ORDER — OMEPRAZOLE 20 MG PO CPDR: 20.0000 mg | DELAYED_RELEASE_CAPSULE | Freq: Every day | ORAL | Status: DC

## 2012-06-02 MED ORDER — BUPROPION HCL ER (XL) 300 MG PO TB24: 300.0000 mg | ORAL_TABLET | Freq: Every day | ORAL | Status: DC

## 2012-07-28 ENCOUNTER — Other Ambulatory Visit: Payer: Self-pay | Admitting: *Deleted

## 2012-07-29 MED ORDER — MELOXICAM 7.5 MG PO TABS: 7.5000 mg | ORAL_TABLET | Freq: Every day | ORAL | Status: DC

## 2012-08-06 ENCOUNTER — Telehealth: Payer: Self-pay

## 2012-08-06 NOTE — Telephone Encounter (Signed)
Pt found out  that polycystic kidney disease is in their family and since pt has hypertension was advised to contact her PCP to ck blood test or other testing to see if pt has polycystic kidney disease.Please advise.

## 2012-08-07 NOTE — Telephone Encounter (Signed)
Will research further. Discussed with her on the phone. 2 other family members with polycystic kidney disease

## 2012-08-13 ENCOUNTER — Encounter: Payer: Self-pay | Admitting: *Deleted

## 2012-08-13 ENCOUNTER — Ambulatory Visit (INDEPENDENT_AMBULATORY_CARE_PROVIDER_SITE_OTHER): Payer: 59 | Admitting: Family Medicine

## 2012-08-13 ENCOUNTER — Encounter: Payer: Self-pay | Admitting: Family Medicine

## 2012-08-13 VITALS — BP 130/84 | HR 109 | Temp 98.4°F | Ht 64.0 in | Wt 232.5 lb

## 2012-08-13 DIAGNOSIS — J019 Acute sinusitis, unspecified: Secondary | ICD-10-CM

## 2012-08-13 MED ORDER — AMOXICILLIN 500 MG PO CAPS: 1000.0000 mg | ORAL_CAPSULE | Freq: Two times a day (BID) | ORAL | Status: DC

## 2012-08-13 MED ORDER — BENZONATATE 200 MG PO CAPS: 200.0000 mg | ORAL_CAPSULE | Freq: Two times a day (BID) | ORAL | Status: DC | PRN

## 2012-08-13 NOTE — Assessment & Plan Note (Signed)
>   1 week of symptoms... Treat with amox, nasal saline and mucolytics.  Post nasal drip is causing cough and hoarseness.. Treat with tessalon perles.

## 2012-08-13 NOTE — Progress Notes (Signed)
  Subjective:    Patient ID: Kristine Curtis, female    DOB: May 30, 1960, 52 y.o.   MRN: [id number]  URI  This is a new problem. The current episode started in the past 7 days. The problem has been gradually worsening. Associated symptoms include chest pain, congestion, coughing, ear pain, headaches, a plugged ear sensation, rhinorrhea, sinus pain and sneezing. Pertinent negatives include no nausea, sore throat, swollen glands, vomiting or wheezing. Associated symptoms comments: Hoarse voice  Pain in chest with cough Productive cough.. Blood streak in mucus.  occ dizzyness  no SOB. Treatments tried: mucinex max, delsym. The treatment provided mild relief.   Hx of asthma, mild intermittent at this point. No albuterol needed in last several years   Review of Systems  HENT: Positive for ear pain, congestion, rhinorrhea and sneezing. Negative for sore throat.   Respiratory: Positive for cough. Negative for wheezing.   Cardiovascular: Positive for chest pain.  Gastrointestinal: Negative for nausea and vomiting.  Neurological: Positive for headaches.       Objective:   Physical Exam  Constitutional: Vital signs are normal. She appears well-developed and well-nourished. She is cooperative.  Non-toxic appearance. She does not appear ill. No distress.  obese  HENT:  Head: Normocephalic.  Right Ear: Hearing, tympanic membrane, external ear and ear canal normal. Tympanic membrane is not erythematous, not retracted and not bulging.  Left Ear: Hearing, tympanic membrane, external ear and ear canal normal. Tympanic membrane is not erythematous, not retracted and not bulging.  Nose: Mucosal edema and rhinorrhea present. Right sinus exhibits maxillary sinus tenderness and frontal sinus tenderness. Left sinus exhibits maxillary sinus tenderness and frontal sinus tenderness.  Mouth/Throat: Uvula is midline, oropharynx is clear and moist and mucous membranes are normal.  Eyes: Conjunctivae, EOM and  lids are normal. Pupils are equal, round, and reactive to light. No foreign bodies found.  Neck: Trachea normal and normal range of motion. Neck supple. Carotid bruit is not present. No mass and no thyromegaly present.  Cardiovascular: Normal rate, regular rhythm, S1 normal, S2 normal, normal heart sounds, intact distal pulses and normal pulses.  Exam reveals no gallop and no friction rub.   No murmur heard. Pulmonary/Chest: Effort normal and breath sounds normal. Not tachypneic. No respiratory distress. She has no decreased breath sounds. She has no wheezes. She has no rhonchi. She has no rales.  Neurological: She is alert.  Skin: Skin is warm, dry and intact. No rash noted.  Psychiatric: Her speech is normal and behavior is normal. Judgment normal. Her mood appears not anxious. Cognition and memory are normal. She does not exhibit a depressed mood.          Assessment & Plan:

## 2012-08-13 NOTE — Patient Instructions (Addendum)
Mucinex DM during the day, avoid decongestant, Nasal saline spray 2 sprays per nostril 2-3 time  A day. Start and complete with antibiotics. Expect improvement in next 5-7 days.. Call if shortness of breath or fever on antibiotics.

## 2012-09-29 ENCOUNTER — Other Ambulatory Visit: Payer: Self-pay | Admitting: *Deleted

## 2012-09-29 MED ORDER — BUPROPION HCL ER (XL) 300 MG PO TB24: 300.0000 mg | ORAL_TABLET | Freq: Every day | ORAL | Status: DC

## 2012-09-29 MED ORDER — CYCLOBENZAPRINE HCL 5 MG PO TABS: 5.0000 mg | ORAL_TABLET | Freq: Three times a day (TID) | ORAL | Status: DC | PRN

## 2012-09-29 MED ORDER — LEVOTHYROXINE SODIUM 75 MCG PO TABS: 75.0000 ug | ORAL_TABLET | Freq: Every day | ORAL | Status: DC

## 2012-09-29 NOTE — Telephone Encounter (Signed)
OKAY TO REFILL?

## 2012-10-13 NOTE — Telephone Encounter (Signed)
Call pam --- sorry this took me a while, but we need to image her kidneys. If she can find out the exact name of the type of polycystic kidney disease it would be helpful. Having both she and her son come at the same office visit would make the most sense.

## 2012-10-13 NOTE — Telephone Encounter (Signed)
Patient advised and will call back for appt after she speaks to son and other family

## 2012-11-13 ENCOUNTER — Other Ambulatory Visit: Payer: Self-pay

## 2012-11-13 DIAGNOSIS — Z1231 Encounter for screening mammogram for malignant neoplasm of breast: Secondary | ICD-10-CM

## 2012-11-16 ENCOUNTER — Encounter: Payer: Self-pay | Admitting: Family Medicine

## 2012-11-16 ENCOUNTER — Ambulatory Visit (INDEPENDENT_AMBULATORY_CARE_PROVIDER_SITE_OTHER): Payer: 59 | Admitting: Family Medicine

## 2012-11-16 VITALS — BP 110/74 | HR 100 | Temp 98.8°F | Ht 64.0 in | Wt 238.4 lb

## 2012-11-16 DIAGNOSIS — Z8271 Family history of polycystic kidney: Secondary | ICD-10-CM

## 2012-11-16 HISTORY — DX: Family history of polycystic kidney: Z82.71

## 2012-11-16 NOTE — Progress Notes (Signed)
   Nature conservation officer at [facility] [address] [place] [address] Phone: [phone] Fax: [phone]  Date:  11/16/2012   Name:  Kristine Curtis   DOB:  1960-06-11   MRN:  [id number] Gender: female Age: 52 y.o.  Primary Physician:  [name], MD  Evaluating MD: [name], MD   Chief Complaint: Follow-up   History of Present Illness:  Kristine Curtis is a 52 y.o. pleasant patient who presents with the following:  PKD:   Assessment and Plan: Family history of polycystic kidney   >15 minutes spent in face to face time with patient, >50% spent in counselling or coordination of care: Literature reviewed. Unknown at this time if dominiant or recessive type. They will check. Old records reviewed, prior CT abd with contrast and u/s show no cysts, per UpToDate, this can rule out PKD by 100%.  Signed, Elpidio Galea. Ernesteen Mihalic, MD 11/16/2012 8:15 AM

## 2012-11-30 ENCOUNTER — Encounter (INDEPENDENT_AMBULATORY_CARE_PROVIDER_SITE_OTHER): Payer: Self-pay | Admitting: General Surgery

## 2012-11-30 ENCOUNTER — Ambulatory Visit (INDEPENDENT_AMBULATORY_CARE_PROVIDER_SITE_OTHER): Payer: 59 | Admitting: General Surgery

## 2012-11-30 VITALS — BP 122/80 | HR 84 | Temp 97.2°F | Resp 16 | Ht 63.5 in | Wt 231.6 lb

## 2012-11-30 DIAGNOSIS — K439 Ventral hernia without obstruction or gangrene: Secondary | ICD-10-CM

## 2012-11-30 NOTE — Progress Notes (Signed)
Patient ID: Kristine Curtis, female   DOB: 10/13/1960, 52 y.o.   MRN: [id number]  Chief Complaint  Patient presents with  . New Evaluation    eval incisional hernia    HPI Kristine Curtis is a 52 y.o. female.  The patient is a 52 year old female who was referred by Dr. Estanislado Pandy for evaluation of an incisional hernia. The patient states she's unsure of the time but states approximately one year. She does have some nausea and pain at times. The patient has previously had a lap cholecystectomy and hysterectomy. HPI  Past Medical History  Diagnosis Date  . Allergic rhinitis due to pollen   . Anemia   . Fibromyalgia   . Cardiomyopathy   . Asthma   . Depression   . GERD (gastroesophageal reflux disease)   . Headache(784.0)   . Hypertension   . Hypothyroidism   . Osteoarthritis   . Restless leg syndrome   . Vertigo   . IBS (irritable bowel syndrome)   . Family history of polycystic kidney 11/16/2012    Past Surgical History  Procedure Laterality Date  . Abdominal hysterectomy    . Cesarean section    . Tubal ligation  1992  . Cholecystectomy  10-2008  . Rotator cuff repair    . Eye surgery      2 2013 and 1981    Family History  Problem Relation Age of Onset  . Cancer Mother     pancreatic    Social History History  Substance Use Topics  . Smoking status: Former Smoker    Quit date: 05/06/1989  . Smokeless tobacco: Never Used     Comment: quit smoking 1990  . Alcohol Use: No    Allergies  Allergen Reactions  . Codeine   . Sulfonamide Derivatives     Current Outpatient Prescriptions  Medication Sig Dispense Refill  . buPROPion (WELLBUTRIN XL) 300 MG 24 hr tablet Take 1 tablet (300 mg total) by mouth daily.  30 tablet  3  . hydrocortisone (ANUSOL-HC) 2.5 % rectal cream Place rectally 2 (two) times daily.  30 g  3  . ibuprofen (ADVIL,MOTRIN) 800 MG tablet Take 800 mg by mouth At the onset of headache  30 tablet  5  . levothyroxine (SYNTHROID, LEVOTHROID) 75  MCG tablet Take 1 tablet (75 mcg total) by mouth daily.  30 tablet  6  . losartan-hydrochlorothiazide (HYZAAR) 50-12.5 MG per tablet Take 1 tablet by mouth daily.  30 tablet  6  . meloxicam (MOBIC) 7.5 MG tablet Take 1 tablet (7.5 mg total) by mouth daily.  30 tablet  5  . nystatin-triamcinolone ointment (MYCOLOG)       . Omega 3-6-9 Fatty Acids (OMEGA 3-6-9 COMPLEX PO) Take by mouth. Hydroeyes, take two by mouth daily      . omeprazole (PRILOSEC) 20 MG capsule Take 1 capsule (20 mg total) by mouth daily.  30 capsule  6  . RESVERATROL PO Take by mouth 2 (two) times daily.      . cyclobenzaprine (FLEXERIL) 5 MG tablet Take 1 tablet (5 mg total) by mouth 3 (three) times daily as needed for muscle spasms.  30 tablet  5   No current facility-administered medications for this visit.    Review of Systems Review of Systems  Constitutional: Negative.   HENT: Negative.   Respiratory: Negative.   Cardiovascular: Negative.   Gastrointestinal: Negative.   Neurological: Negative.   All other systems reviewed and are negative.  Blood pressure 122/80, pulse 84, temperature 97.2 F (36.2 C), temperature source Temporal, resp. rate 16, height 5' 3.5" (1.613 m), weight 231 lb 9.6 oz (105.053 kg).  Physical Exam Physical Exam  Constitutional: She is oriented to person, place, and time. She appears well-developed and well-nourished.  HENT:  Head: Normocephalic and atraumatic.  Eyes: Conjunctivae and EOM are normal. Pupils are equal, round, and reactive to light.  Neck: Normal range of motion. Neck supple.  Cardiovascular: Normal rate, regular rhythm and normal heart sounds.   Pulmonary/Chest: Effort normal and breath sounds normal.  Abdominal: Soft. Bowel sounds are normal. She exhibits mass (inmidline near umbilicus). There is tenderness (near umb).  Questionable hernia versus rectus diastases in the midline at the umbilicus.  Musculoskeletal: Normal range of motion.  Neurological: She is alert  and oriented to person, place, and time.    Data Reviewed none  Assessment    52 year old female with a questionable incisional hernia versus rectus diastases     Plan    1. We'll proceed with a CT scan of her abdomen and pelvis without contrast to evaluate for possible incisional hernia versus rectus diastases. 2. Once results have been verified will have the patient scheduled if it is an incisional hernia from a laparoscopic repair. I discussed with the patient the procedure and detail. 3. All risks and benefits were discussed with the patient, to generally include infection, bleeding, damage to surrounding structures, acute and chronic nerve pain, and recurrence. Alternatives were offered and described.  All questions were answered and the patient voiced understanding of the procedure and wishes to proceed at this point.         Marigene Ehlers., Becky Colan 11/30/2012, 9:20 AM

## 2012-12-01 ENCOUNTER — Telehealth (INDEPENDENT_AMBULATORY_CARE_PROVIDER_SITE_OTHER): Payer: Self-pay | Admitting: General Surgery

## 2012-12-01 ENCOUNTER — Ambulatory Visit
Admission: RE | Admit: 2012-12-01 | Discharge: 2012-12-01 | Disposition: A | Discharge status: Home / Self Care | Payer: 59 | Source: Ambulatory Visit | Attending: General Surgery | Admitting: General Surgery

## 2012-12-01 DIAGNOSIS — K439 Ventral hernia without obstruction or gangrene: Secondary | ICD-10-CM

## 2012-12-01 NOTE — Telephone Encounter (Signed)
Message copied by June Leap on Tue Dec 01, 2012  2:57 PM ------      Message from: Axel Filler      Created: Tue Dec 01, 2012  1:09 PM       Good afternoon            Can you call her and let her know that I saw her CT scan and she does have a hernia as well as some rectus diastasis.  We can definitely fix her hernia laparoscopically.  I d/w her the details of the surgery.  I think I filled out the booking sheet on her.  Then we can just get her scheduled.            Thanks      AR ------

## 2012-12-01 NOTE — Telephone Encounter (Signed)
Patient returned my call and I let her know about CT results and that I was taking her orders to the OR schedulers and they will be calling within a day or two.Marland KitchenMarland KitchenI reminded her again not to take any ASA, blood thinners, or NSAIDS 7 days prior to her surgery date...patient verbalized agreement and asked about what to do with fmla paperwork and I told her to bring them to checkout and they will have her sign a release and pay $20 fee..she again verbalized agreement

## 2012-12-01 NOTE — Telephone Encounter (Signed)
LMOM on both home and cell #'s for patient to call Malaika Arnall back...12/01/12 @ 2:59

## 2012-12-04 ENCOUNTER — Ambulatory Visit
Admission: RE | Admit: 2012-12-04 | Discharge: 2012-12-04 | Disposition: A | Discharge status: Home / Self Care | Payer: 59 | Source: Ambulatory Visit

## 2012-12-04 DIAGNOSIS — Z1231 Encounter for screening mammogram for malignant neoplasm of breast: Secondary | ICD-10-CM

## 2012-12-25 ENCOUNTER — Encounter: Payer: Self-pay | Admitting: Family Medicine

## 2013-01-05 ENCOUNTER — Other Ambulatory Visit (INDEPENDENT_AMBULATORY_CARE_PROVIDER_SITE_OTHER): Payer: Self-pay | Admitting: *Deleted

## 2013-01-05 DIAGNOSIS — K432 Incisional hernia without obstruction or gangrene: Secondary | ICD-10-CM

## 2013-01-05 MED ORDER — OXYCODONE-ACETAMINOPHEN 5-325 MG PO TABS: 1.0000 | ORAL_TABLET | ORAL | Status: DC | PRN

## 2013-01-11 ENCOUNTER — Other Ambulatory Visit: Payer: Self-pay | Admitting: Family Medicine

## 2013-01-11 ENCOUNTER — Telehealth (INDEPENDENT_AMBULATORY_CARE_PROVIDER_SITE_OTHER): Payer: Self-pay

## 2013-01-11 NOTE — Telephone Encounter (Signed)
Pt called stating she is having stinging,tingling discomfort to side of incision. No redness. Not feverish. No increased swelling. Incisions look good. Pt advised this may be nerve irritation from surgery. Pt will will add advil per protocol to her pain management and call if this does not help resolve symptom. Pt advised if above symptoms occur she will need to call to be seen sooner than po appt. Pt states she understands.

## 2013-01-13 ENCOUNTER — Telehealth (INDEPENDENT_AMBULATORY_CARE_PROVIDER_SITE_OTHER): Payer: Self-pay | Admitting: General Surgery

## 2013-01-13 NOTE — Telephone Encounter (Signed)
Patient called yesterday wanting refill on her pain medicine...per the patient not having any N/V, chills, fever.  She is alternating the ibuprofen in between and protocol Norco 5/325 #30 no refills was called into [facility] per verbal orders from AR.Marland Kitchenspoke with [place] [phone].Marland KitchenMarland Kitchenpatient notified and pleased with POC at this time and will keep her follow up appt 9/16

## 2013-01-19 ENCOUNTER — Encounter (INDEPENDENT_AMBULATORY_CARE_PROVIDER_SITE_OTHER): Payer: Self-pay | Admitting: General Surgery

## 2013-01-19 ENCOUNTER — Ambulatory Visit (INDEPENDENT_AMBULATORY_CARE_PROVIDER_SITE_OTHER): Payer: 59 | Admitting: General Surgery

## 2013-01-19 VITALS — BP 140/84 | HR 78 | Resp 16 | Ht 63.5 in | Wt 232.4 lb

## 2013-01-19 DIAGNOSIS — Z8719 Personal history of other diseases of the digestive system: Secondary | ICD-10-CM

## 2013-01-19 DIAGNOSIS — Z9889 Other specified postprocedural states: Secondary | ICD-10-CM

## 2013-01-19 NOTE — Progress Notes (Signed)
Patient ID: Kristine Curtis, female   DOB: Dec 29, 1960, 52 y.o.   MRN: [id number] The patient is a 52 year old female status post lap ventral hernia repair with mesh. The patient has been doing well postoperatively.  The patient had no complaints at this time.  On exam: Wounds are clean dry and intact, there is no hernia on palpation  Assessment and plan: 52 year old female status post lap tension hernia repair with mesh 1. The patient can follow up as needed 2. We discussed with the restrictions of 15 and 20 pounds for another month.

## 2013-02-03 ENCOUNTER — Telehealth (INDEPENDENT_AMBULATORY_CARE_PROVIDER_SITE_OTHER): Payer: Self-pay | Admitting: General Surgery

## 2013-02-03 NOTE — Telephone Encounter (Signed)
Tried to return pt's call and let her know that I would type a letter for her to [facility] but she would still have 15-20 lb lifting, pushing, or pulling restrictions per AR office note on 9/16 when he seen her postoperatively until 02/16/13

## 2013-02-03 NOTE — Telephone Encounter (Signed)
Message copied by June Leap on Wed Feb 03, 2013 12:06 PM ------      Message from: Mervin Kung      Created: Wed Feb 03, 2013 11:37 AM      Contact: [phone]       Lawson Fiscal,       Pt called she needs a note from Dr Derrell Lolling stating that she can go back to work starting this coming Monday.  She will pick the letter up if we call her and tell her its ready.       sonya ------

## 2013-02-05 ENCOUNTER — Encounter (INDEPENDENT_AMBULATORY_CARE_PROVIDER_SITE_OTHER): Payer: Self-pay | Admitting: General Surgery

## 2013-02-08 ENCOUNTER — Other Ambulatory Visit: Payer: Self-pay | Admitting: Family Medicine

## 2013-02-08 NOTE — Telephone Encounter (Signed)
Last office visit 11/16/2012.  Ok to refill?

## 2013-03-11 ENCOUNTER — Other Ambulatory Visit: Payer: Self-pay

## 2013-04-20 ENCOUNTER — Other Ambulatory Visit: Payer: Self-pay | Admitting: Family Medicine

## 2013-05-17 ENCOUNTER — Other Ambulatory Visit: Payer: Self-pay | Admitting: Family Medicine

## 2013-08-17 ENCOUNTER — Other Ambulatory Visit: Payer: Self-pay | Admitting: Family Medicine

## 2013-09-22 ENCOUNTER — Other Ambulatory Visit: Payer: Self-pay | Admitting: Family Medicine

## 2013-09-22 NOTE — Telephone Encounter (Signed)
Ok to refill 30 each, 3 refills.  F/u CPX in 2-3 mo

## 2013-09-22 NOTE — Telephone Encounter (Signed)
Last office visit 11/16/2012. Last TSH 05/26/2012. Ok to refill?

## 2013-09-24 ENCOUNTER — Telehealth: Payer: Self-pay | Admitting: Family Medicine

## 2013-09-24 NOTE — Telephone Encounter (Signed)
Encounter opened in error

## 2013-10-21 ENCOUNTER — Other Ambulatory Visit: Payer: Self-pay | Admitting: Family Medicine

## 2013-10-31 ENCOUNTER — Other Ambulatory Visit: Payer: Self-pay | Admitting: Family Medicine

## 2013-10-31 NOTE — Telephone Encounter (Signed)
Last office visit 11/16/2012.  Ok to refill?

## 2013-11-03 ENCOUNTER — Other Ambulatory Visit: Payer: Self-pay | Admitting: Family Medicine

## 2013-11-03 DIAGNOSIS — E039 Hypothyroidism, unspecified: Secondary | ICD-10-CM

## 2013-11-03 DIAGNOSIS — E785 Hyperlipidemia, unspecified: Secondary | ICD-10-CM

## 2013-11-03 DIAGNOSIS — E559 Vitamin D deficiency, unspecified: Secondary | ICD-10-CM

## 2013-11-03 DIAGNOSIS — Z79899 Other long term (current) drug therapy: Secondary | ICD-10-CM

## 2013-11-04 ENCOUNTER — Ambulatory Visit: Payer: 59

## 2013-11-04 ENCOUNTER — Other Ambulatory Visit (INDEPENDENT_AMBULATORY_CARE_PROVIDER_SITE_OTHER): Payer: 59

## 2013-11-04 DIAGNOSIS — Z79899 Other long term (current) drug therapy: Secondary | ICD-10-CM

## 2013-11-04 DIAGNOSIS — E785 Hyperlipidemia, unspecified: Secondary | ICD-10-CM

## 2013-11-04 DIAGNOSIS — E039 Hypothyroidism, unspecified: Secondary | ICD-10-CM

## 2013-11-04 DIAGNOSIS — E559 Vitamin D deficiency, unspecified: Secondary | ICD-10-CM

## 2013-11-04 LAB — CBC WITH DIFFERENTIAL/PLATELET
Basophils Absolute: 0 10*3/uL (ref 0.0–0.1)
Basophils Relative: 0.3 % (ref 0.0–3.0)
Eosinophils Absolute: 0.3 10*3/uL (ref 0.0–0.7)
Eosinophils Relative: 4.2 % (ref 0.0–5.0)
HCT: 39.5 % (ref 36.0–46.0)
Hemoglobin: 13.4 g/dL (ref 12.0–15.0)
Lymphocytes Relative: 35.6 % (ref 12.0–46.0)
Lymphs Abs: 2.5 10*3/uL (ref 0.7–4.0)
MCHC: 34 g/dL (ref 30.0–36.0)
MCV: 88.2 fl (ref 78.0–100.0)
Monocytes Absolute: 0.4 10*3/uL (ref 0.1–1.0)
Monocytes Relative: 6 % (ref 3.0–12.0)
Neutro Abs: 3.7 10*3/uL (ref 1.4–7.7)
Neutrophils Relative %: 53.9 % (ref 43.0–77.0)
Platelets: 258 10*3/uL (ref 150.0–400.0)
RBC: 4.48 Mil/uL (ref 3.87–5.11)
RDW: 13.9 % (ref 11.5–15.5)
WBC: 6.9 10*3/uL (ref 4.0–10.5)

## 2013-11-04 LAB — BASIC METABOLIC PANEL
BUN: 12 mg/dL (ref 6–23)
CO2: 28 mEq/L (ref 19–32)
Calcium: 9.3 mg/dL (ref 8.4–10.5)
Chloride: 107 mEq/L (ref 96–112)
Creatinine, Ser: 0.8 mg/dL (ref 0.4–1.2)
GFR: 78.4 mL/min (ref >60.00)
Glucose, Bld: 116 mg/dL — ABNORMAL HIGH (ref 70–99)
Potassium: 3.7 mEq/L (ref 3.5–5.1)
Sodium: 140 mEq/L (ref 135–145)

## 2013-11-04 LAB — LIPID PANEL
Cholesterol: 161 mg/dL (ref 0–200)
HDL: 44.4 mg/dL (ref >39.00)
LDL Cholesterol: 90 mg/dL (ref 0–99)
NonHDL: 116.6
Total CHOL/HDL Ratio: 4
Triglycerides: 133 mg/dL (ref 0.0–149.0)
VLDL: 26.6 mg/dL (ref 0.0–40.0)

## 2013-11-04 LAB — HEPATIC FUNCTION PANEL
ALT: 26 U/L (ref 0–35)
AST: 24 U/L (ref 0–37)
Albumin: 3.7 g/dL (ref 3.5–5.2)
Alkaline Phosphatase: 103 U/L (ref 39–117)
Bilirubin, Direct: 0.1 mg/dL (ref 0.0–0.3)
Total Bilirubin: 0.9 mg/dL (ref 0.2–1.2)
Total Protein: 6.8 g/dL (ref 6.0–8.3)

## 2013-11-04 LAB — TSH: TSH: 5.02 u[IU]/mL — ABNORMAL HIGH (ref 0.35–4.50)

## 2013-11-04 LAB — T3, FREE: T3, Free: 2.5 pg/mL (ref 2.3–4.2)

## 2013-11-04 LAB — VITAMIN D 25 HYDROXY (VIT D DEFICIENCY, FRACTURES): VITD: 35.68 ng/mL

## 2013-11-04 LAB — T4, FREE: Free T4: 0.95 ng/dL (ref 0.60–1.60)

## 2013-11-05 ENCOUNTER — Ambulatory Visit (INDEPENDENT_AMBULATORY_CARE_PROVIDER_SITE_OTHER): Payer: 59 | Admitting: Family Medicine

## 2013-11-05 ENCOUNTER — Encounter: Payer: Self-pay | Admitting: Family Medicine

## 2013-11-05 VITALS — BP 136/88 | HR 96 | Temp 98.5°F | Wt 229.2 lb

## 2013-11-05 DIAGNOSIS — N390 Urinary tract infection, site not specified: Secondary | ICD-10-CM | POA: Insufficient documentation

## 2013-11-05 DIAGNOSIS — N3000 Acute cystitis without hematuria: Secondary | ICD-10-CM

## 2013-11-05 DIAGNOSIS — R109 Unspecified abdominal pain: Secondary | ICD-10-CM | POA: Insufficient documentation

## 2013-11-05 MED ORDER — CIPROFLOXACIN HCL 250 MG PO TABS: 250.0000 mg | ORAL_TABLET | Freq: Two times a day (BID) | ORAL | Status: DC

## 2013-11-05 MED ORDER — TAMSULOSIN HCL 0.4 MG PO CAPS: 0.4000 mg | ORAL_CAPSULE | Freq: Every day | ORAL | Status: DC

## 2013-11-05 NOTE — Progress Notes (Signed)
Pre visit review using our clinic review tool, if applicable. No additional management support is needed unless otherwise documented below in the visit note. 

## 2013-11-05 NOTE — Patient Instructions (Signed)
I think you have a uti and possibly a kidney stone that is moving Take the cipro as directed for uti Drink lots of water Take the flomax - to help open up urinary tract- if it makes you dizzy- stop it and if you pass a stone and pain stops- stop it  Call to update Dr Patsy Lager on Monday  If worse - go to ER for further evaluation and likely CT scan

## 2013-11-05 NOTE — Progress Notes (Signed)
Subjective:    Patient ID: Kristine Curtis, female    DOB: 05/28/1960, 53 y.o.   MRN: [id number]  HPI Sharp flank pain 2 weeks ago (L) It was sharp and somewhat severe   Got better with ibuprofen Then bladder pain /freq/urgency/ and dysuria Azo helped  Got better and then some similar pain on Monday- it had moved into L groin area- not as severe   Could not give ua today - just a drop   She has never had kidney stone before - but suspects that is what this was   Now pressure L groin-not as bad but annoying  Some frequency and dysuria remain   She is on doxycycline (from holistic practitioner)- for some sort of old "mono type" infection  Has been on that for a while   Patient Active Problem List   Diagnosis Date Noted  . UTI (urinary tract infection) 11/05/2013  . Left flank pain 11/05/2013  . Family history of polycystic kidney 11/16/2012  . UNSPECIFIED VITAMIN D DEFICIENCY 10/09/2009  . FATIGUE 05/22/2009  . OTHER AND UNSPECIFIED HYPERLIPIDEMIA 11/15/2008  . ANEMIA-NOS 08/31/2008  . DEPRESSION 08/31/2008  . HYPERTENSION 08/31/2008  . ALLERGIC RHINITIS 08/31/2008  . ASTHMA 08/31/2008  . GERD 08/31/2008  . OSTEOARTHRITIS 08/31/2008  . HYPOTHYROIDISM 08/30/2008  . MIGRAINE HEADACHE 08/30/2008  . FIBROMYALGIA 08/30/2008   Past Medical History  Diagnosis Date  . Allergic rhinitis due to pollen   . Anemia   . Fibromyalgia   . Cardiomyopathy   . Asthma   . Depression   . GERD (gastroesophageal reflux disease)   . Headache(784.0)   . Hypertension   . Hypothyroidism   . Osteoarthritis   . Restless leg syndrome   . Vertigo   . IBS (irritable bowel syndrome)   . Family history of polycystic kidney 11/16/2012    neg CT   Past Surgical History  Procedure Laterality Date  . Abdominal hysterectomy    . Cesarean section    . Tubal ligation  1992  . Cholecystectomy  10-2008  . Shoulder arthroscopy w/ subacromial decompression and distal clavicle excision Right       rotator cuff repair  . Eye surgery      2 2013 and 1981   History  Substance Use Topics  . Smoking status: Former Smoker    Quit date: 05/06/1989  . Smokeless tobacco: Never Used     Comment: quit smoking 1990  . Alcohol Use: No   Family History  Problem Relation Age of Onset  . Cancer Mother     pancreatic   Allergies  Allergen Reactions  . Codeine   . Sulfonamide Derivatives    Current Outpatient Prescriptions on File Prior to Visit  Medication Sig Dispense Refill  . buPROPion (WELLBUTRIN XL) 300 MG 24 hr tablet TAKE 1 TABLET BY MOUTH DAILY  30 tablet  3  . cyclobenzaprine (FLEXERIL) 5 MG tablet TAKE 1 TABLET BY MOUTH 3 TIMES A DAY AS NEEDED FOR MUSCLE SPASMS.  30 tablet  2  . hydrocortisone (ANUSOL-HC) 2.5 % rectal cream Place rectally 2 (two) times daily.  30 g  3  . ibuprofen (ADVIL,MOTRIN) 800 MG tablet Take 800 mg by mouth At the onset of headache  30 tablet  5  . levothyroxine (SYNTHROID, LEVOTHROID) 75 MCG tablet TAKE 1 TABLET BY MOUTH DAILY  30 tablet  3  . losartan-hydrochlorothiazide (HYZAAR) 50-12.5 MG per tablet TAKE 1 TABLET BY MOUTH DAILY  30 tablet  0  .  meloxicam (MOBIC) 7.5 MG tablet TAKE ONE (1) TABLET BY MOUTH EACH DAY  30 tablet  11  . nystatin-triamcinolone ointment (MYCOLOG)       . omeprazole (PRILOSEC) 20 MG capsule TAKE ONE CAPSULE BY MOUTH DAILY  30 capsule  0  . RESVERATROL PO Take by mouth 2 (two) times daily.       No current facility-administered medications on file prior to visit.    Review of Systems    Review of Systems  Constitutional: Negative for fever, appetite change, fatigue and unexpected weight change.  Eyes: Negative for pain and visual disturbance.  Respiratory: Negative for cough and shortness of breath.   Cardiovascular: Negative for cp or palpitations    Gastrointestinal: Negative for nausea, diarrhea and constipation.  Genitourinary: pos  for urgency and frequency. neg for gross hematuria/ pos for L sided pelvic  pressure that was originally in flank  Skin: Negative for pallor or rash   Neurological: Negative for weakness, light-headedness, numbness and headaches.  Hematological: Negative for adenopathy. Does not bruise/bleed easily.  Psychiatric/Behavioral: Negative for dysphoric mood. The patient is not nervous/anxious.      Objective:   Physical Exam  Constitutional: She appears well-developed and well-nourished. No distress.  obese and well appearing   HENT:  Head: Normocephalic and atraumatic.  Mouth/Throat: Oropharynx is clear and moist.  Eyes: Conjunctivae and EOM are normal. Pupils are equal, round, and reactive to light. No scleral icterus.  Neck: Normal range of motion. Neck supple.  Cardiovascular: Regular rhythm and intact distal pulses.   Rate in low 90s  Pulmonary/Chest: Effort normal and breath sounds normal. No respiratory distress. She has no wheezes.  Abdominal: Soft. Bowel sounds are normal. She exhibits no distension and no mass. There is tenderness. There is no rebound and no guarding.  Mild suprapubic and L pelvic tenderness without rebound or guarding  Slight L cva tenderness   Musculoskeletal: She exhibits no edema.  Lymphadenopathy:    She has no cervical adenopathy.  Neurological: She is alert.  Skin: Skin is warm and dry. No rash noted. No erythema.  Psychiatric: She has a normal mood and affect.          Assessment & Plan:   Problem List Items Addressed This Visit     Genitourinary   UTI (urinary tract infection) - Primary     With hx of L flank pain that has moved to the groin and improved  Story susp of moving renal stone  Pt is unable to give a sample today  Enc fluids- will cover with cipro and also trial of flomax to help move stone-pt will watch for this  inst if worse or if new symptoms like fever to update, otherwise f/u with PCP next week for re check and ua       Other   Left flank pain

## 2013-11-07 NOTE — Assessment & Plan Note (Signed)
Suspect pt had a kidney stone that has moved by hx -now much imp  Cover for uti and use flomax  Enc fluids  Close f/u

## 2013-11-07 NOTE — Assessment & Plan Note (Signed)
With hx of L flank pain that has moved to the groin and improved  Story susp of moving renal stone  Pt is unable to give a sample today  Enc fluids- will cover with cipro and also trial of flomax to help move stone-pt will watch for this  inst if worse or if new symptoms like fever to update, otherwise f/u with PCP next week for re check and ua

## 2013-11-08 ENCOUNTER — Telehealth: Payer: Self-pay | Admitting: Family Medicine

## 2013-11-08 NOTE — Telephone Encounter (Signed)
Please have her come in for a visit - I want to try to get a urine specimen this time - and stop the flomax since it makes her dizzy and does not seem to help

## 2013-11-08 NOTE — Telephone Encounter (Signed)
Pt called to update [place] on how she is feeling. Feels better but does have symptoms of uti with poss kidney stone is still existing. After using bathroom she has several hours of constant sensation of to having use the bathroom. Tamsulosin has caused dizziness 6-7 hours after taking after the second day. Pt does not know if she should be feeling like this or not. Please advise.  # [phone]

## 2013-11-08 NOTE — Telephone Encounter (Signed)
Patient notified as instructed by telephone. Appointment scheduled as instructed.

## 2013-11-09 ENCOUNTER — Encounter: Payer: Self-pay | Admitting: Family Medicine

## 2013-11-09 ENCOUNTER — Ambulatory Visit (INDEPENDENT_AMBULATORY_CARE_PROVIDER_SITE_OTHER): Payer: 59 | Admitting: Family Medicine

## 2013-11-09 VITALS — BP 118/78 | HR 86 | Temp 98.0°F | Ht 63.5 in | Wt 228.2 lb

## 2013-11-09 DIAGNOSIS — R109 Unspecified abdominal pain: Secondary | ICD-10-CM

## 2013-11-09 DIAGNOSIS — R350 Frequency of micturition: Secondary | ICD-10-CM

## 2013-11-09 DIAGNOSIS — N3000 Acute cystitis without hematuria: Secondary | ICD-10-CM

## 2013-11-09 LAB — POCT URINALYSIS DIPSTICK
Bilirubin, UA: NEGATIVE
Glucose, UA: NEGATIVE
Ketones, UA: NEGATIVE
Nitrite, UA: NEGATIVE
Protein, UA: NEGATIVE
Spec Grav, UA: 1.01
Urobilinogen, UA: 0.2
pH, UA: 6

## 2013-11-09 MED ORDER — CIPROFLOXACIN HCL 250 MG PO TABS: 250.0000 mg | ORAL_TABLET | Freq: Two times a day (BID) | ORAL | Status: DC

## 2013-11-09 NOTE — Progress Notes (Signed)
Pre visit review using our clinic review tool, if applicable. No additional management support is needed unless otherwise documented below in the visit note. 

## 2013-11-09 NOTE — Patient Instructions (Signed)
Continue the cipro for 5 more days and push water intake  If symptoms are not gone after this let me know If anything worsens let me know

## 2013-11-09 NOTE — Assessment & Plan Note (Signed)
Much improved after 5 d of cipro and flank pain is improved  Still suspect she may have passed a stone Continue cipro 5 more days Rev ua  Update if not starting to improve in a week or if worsening

## 2013-11-09 NOTE — Assessment & Plan Note (Signed)
Much improved  Suspect she may have passed a stone If symptoms return consider CT

## 2013-11-09 NOTE — Progress Notes (Signed)
Subjective:    Patient ID: Kristine Curtis, female    DOB: 02-05-1961, 53 y.o.   MRN: [id number]  HPI Here for f/u of uti symptoms   On cipro  Pain and pressure are much better  Still some frequency  ua looks pretty good   Is drinking lots of water   Just a little pain in the lower side/pelvic- much better  Never witnessed a passed stone  Nothing like the L flank pain she had 2 weeks before   flomax did make her dizzy BP Readings from Last 3 Encounters:  11/09/13 118/78  11/05/13 136/88  01/19/13 140/84     Results for orders placed in visit on 11/09/13  POCT URINALYSIS DIPSTICK      Result Value Ref Range   Color, UA yellow     Clarity, UA cloudy     Glucose, UA neg.     Bilirubin, UA neg.     Ketones, UA neg.     Spec Grav, UA 1.010     Blood, UA Trace     pH, UA 6.0     Protein, UA neg.     Urobilinogen, UA 0.2     Nitrite, UA neg.     Leukocytes, UA Trace       Patient Active Problem List   Diagnosis Date Noted  . UTI (urinary tract infection) 11/05/2013  . Left flank pain 11/05/2013  . Family history of polycystic kidney 11/16/2012  . UNSPECIFIED VITAMIN D DEFICIENCY 10/09/2009  . FATIGUE 05/22/2009  . OTHER AND UNSPECIFIED HYPERLIPIDEMIA 11/15/2008  . ANEMIA-NOS 08/31/2008  . DEPRESSION 08/31/2008  . HYPERTENSION 08/31/2008  . ALLERGIC RHINITIS 08/31/2008  . ASTHMA 08/31/2008  . GERD 08/31/2008  . OSTEOARTHRITIS 08/31/2008  . HYPOTHYROIDISM 08/30/2008  . MIGRAINE HEADACHE 08/30/2008  . FIBROMYALGIA 08/30/2008   Past Medical History  Diagnosis Date  . Allergic rhinitis due to pollen   . Anemia   . Fibromyalgia   . Cardiomyopathy   . Asthma   . Depression   . GERD (gastroesophageal reflux disease)   . Headache(784.0)   . Hypertension   . Hypothyroidism   . Osteoarthritis   . Restless leg syndrome   . Vertigo   . IBS (irritable bowel syndrome)   . Family history of polycystic kidney 11/16/2012    neg CT   Past Surgical History    Procedure Laterality Date  . Abdominal hysterectomy    . Cesarean section    . Tubal ligation  1992  . Cholecystectomy  10-2008  . Shoulder arthroscopy w/ subacromial decompression and distal clavicle excision Right     rotator cuff repair  . Eye surgery      2 2013 and 1981   History  Substance Use Topics  . Smoking status: Former Smoker    Quit date: 05/06/1989  . Smokeless tobacco: Never Used     Comment: quit smoking 1990  . Alcohol Use: No   Family History  Problem Relation Age of Onset  . Cancer Mother     pancreatic   Allergies  Allergen Reactions  . Codeine   . Sulfonamide Derivatives    Current Outpatient Prescriptions on File Prior to Visit  Medication Sig Dispense Refill  . Barberry-Oreg Grape-Goldenseal (BERBERINE COMPLEX PO) Take 1-2 capsules by mouth daily.      Marland Kitchen buPROPion (WELLBUTRIN XL) 300 MG 24 hr tablet TAKE 1 TABLET BY MOUTH DAILY  30 tablet  3  . Cholecalciferol (VITAMIN D-3) 5000 UNITS  TABS Take 1 capsule by mouth daily.      . ciprofloxacin (CIPRO) 250 MG tablet Take 1 tablet (250 mg total) by mouth 2 (two) times daily.  14 tablet  0  . cyclobenzaprine (FLEXERIL) 5 MG tablet TAKE 1 TABLET BY MOUTH 3 TIMES A DAY AS NEEDED FOR MUSCLE SPASMS.  30 tablet  2  . doxycycline (VIBRAMYCIN) 100 MG capsule Take 100 mg by mouth 2 (two) times daily.      . hydrocortisone (ANUSOL-HC) 2.5 % rectal cream Place rectally 2 (two) times daily.  30 g  3  . ibuprofen (ADVIL,MOTRIN) 800 MG tablet Take 800 mg by mouth At the onset of headache  30 tablet  5  . levothyroxine (SYNTHROID, LEVOTHROID) 75 MCG tablet TAKE 1 TABLET BY MOUTH DAILY  30 tablet  3  . losartan-hydrochlorothiazide (HYZAAR) 50-12.5 MG per tablet TAKE 1 TABLET BY MOUTH DAILY  30 tablet  0  . meloxicam (MOBIC) 7.5 MG tablet TAKE ONE (1) TABLET BY MOUTH EACH DAY  30 tablet  11  . NALTREXONE HCL PO Take by mouth at bedtime.      . NON FORMULARY Take 2 capsules by mouth daily. Adrenplus 300mg       .  nystatin-triamcinolone ointment (MYCOLOG)       . omeprazole (PRILOSEC) 20 MG capsule TAKE ONE CAPSULE BY MOUTH DAILY  30 capsule  0  . progesterone (PROMETRIUM) 200 MG capsule Take 200 mg by mouth daily.      Marland Kitchen. RESVERATROL PO Take by mouth 2 (two) times daily.      . Testosterone Propionate (FIRST-TESTOSTERONE) 2 % OINT Place onto the skin.       No current facility-administered medications on file prior to visit.    Review of Systems Review of Systems  Constitutional: Negative for fever, appetite change, fatigue and unexpected weight change.  Eyes: Negative for pain and visual disturbance.  Respiratory: Negative for cough and shortness of breath.   Cardiovascular: Negative for cp or palpitations    Gastrointestinal: Negative for nausea, diarrhea and constipation. pos for L sided pain/ dull, neg for blood in stool or dark stool  Genitourinary: Negative for urgency and frequency. pos for slt pelvic pressure  Skin: Negative for pallor or rash   Neurological: Negative for weakness, light-headedness, numbness and headaches.  Hematological: Negative for adenopathy. Does not bruise/bleed easily.  Psychiatric/Behavioral: Negative for dysphoric mood. The patient is not nervous/anxious.         Objective:   Physical Exam  Constitutional: She appears well-developed and well-nourished. No distress.  obese and well appearing   HENT:  Head: Normocephalic and atraumatic.  Eyes: Conjunctivae and EOM are normal. Pupils are equal, round, and reactive to light. No scleral icterus.  Neck: Normal range of motion. Neck supple.  Cardiovascular: Normal rate and regular rhythm.   Pulmonary/Chest: Effort normal and breath sounds normal.  Abdominal: Soft. Bowel sounds are normal. She exhibits no distension and no mass. There is tenderness. There is no rebound and no guarding.  Tender over L mid abdomen and suprapubic (mild) No rebound or guarding  Musculoskeletal: She exhibits no edema.  No spinal  tenderness  Lymphadenopathy:    She has no cervical adenopathy.  Neurological: She is alert.  Skin: Skin is warm and dry. No rash noted. No erythema.  Psychiatric: She has a normal mood and affect.          Assessment & Plan:   Problem List Items Addressed This Visit  Genitourinary   UTI (urinary tract infection)     Much improved after 5 d of cipro and flank pain is improved  Still suspect she may have passed a stone Continue cipro 5 more days Rev ua  Update if not starting to improve in a week or if worsening        Other   Left flank pain     Much improved  Suspect she may have passed a stone If symptoms return consider CT      Other Visit Diagnoses   Urinary frequency    -  Primary    Relevant Orders       POCT urinalysis dipstick (Completed)

## 2013-11-11 ENCOUNTER — Ambulatory Visit (INDEPENDENT_AMBULATORY_CARE_PROVIDER_SITE_OTHER): Payer: 59 | Admitting: Family Medicine

## 2013-11-11 ENCOUNTER — Encounter: Payer: Self-pay | Admitting: Family Medicine

## 2013-11-11 VITALS — BP 120/90 | HR 88 | Temp 98.4°F | Ht 63.5 in | Wt 225.5 lb

## 2013-11-11 DIAGNOSIS — M1990 Unspecified osteoarthritis, unspecified site: Secondary | ICD-10-CM

## 2013-11-11 DIAGNOSIS — Z0000 Encounter for general adult medical examination without abnormal findings: Secondary | ICD-10-CM

## 2013-11-11 DIAGNOSIS — M130 Polyarthritis, unspecified: Secondary | ICD-10-CM

## 2013-11-11 NOTE — Progress Notes (Signed)
[place] [address] Phone: [phone] Fax: [phone]  Patient ID: Kristine Curtis MRN: [id number], DOB: [birth year], 53 y.o. Date of Encounter: 11/11/2013  Primary Physician:  Owens Loffler, MD   Chief Complaint: Annual Exam  Subjective:   History of Present Illness:  Kristine Curtis is a 53 y.o. pleasant patient who presents with the following:  Health Maintenance Summary Reviewed and updated, unless pt declines services. Additional acute issues, also  Tobacco History Reviewed. Non-smoker Alcohol: No concerns, no excessive use Exercise Habits: Some activity, (rare) rec at least 30 mins 5 times a week STD concerns: none Drug Use: None Birth control method: hyst Menses regular: n/a Lumps or breast concerns: no Breast Cancer Family History: no  Health Maintenance  Topic Date Due  . Influenza Vaccine  12/04/2013  . Colonoscopy  12/05/2013  . Tetanus/tdap  05/06/2014  . Mammogram  12/05/2014   [facility].  Son, Sundance.  Naturopathic physician. The patient brings in about 5 or 6 different pages and labs from a naturopathic physician that she saw her recently. She met a number of recommendations including a gluten-free diet, and the patient does not have celiac disease. There also a number of labs including a massive genetic screen, screening for multiple things such as mycoplasma, Legionella, and other common infectious agents including adenovirus and EBV.  She wanted me to go over with him also, that did my best, with the ones that I was familiar with. There were a few that I honestly have ever seen before. Some of these things make no difference, certainly most 54 year old we'll have been exposed to mycoplasma and Epstein-Barr earlier in her life. She did have a minimally elevated CRP.  The natural physician told her that she was convinced that she had some type of systemic rheumatological or autoimmune problem based on her lab work. As far  as I can tell no rheumatological workers been done, and she has a minimally elevated CRP.  Has some stiff joints. Has some difficulty getting up and down out of the bed and has to stretch and get out of bed. On further questioning, the patient has diffuse polyarthropathy involving virtually every joint and is having pain in her joints that is quite limiting to her. She is not having any focal synovitis that I can appreciate or focal tenosynovitis. She does complain of swelling. She has been diagnosed with fibromyalgia in the past. To my knowledge she has never really had a full scale rheumatological evaluation.  Polyarthropathy work-up.    There is no immunization history on file for this patient.  Patient Active Problem List   Diagnosis Date Noted  . Family history of polycystic kidney 11/16/2012  . UNSPECIFIED VITAMIN D DEFICIENCY 10/09/2009  . FATIGUE 05/22/2009  . OTHER AND UNSPECIFIED HYPERLIPIDEMIA 11/15/2008  . ANEMIA-NOS 08/31/2008  . Major depression in complete remission 08/31/2008  . HYPERTENSION 08/31/2008  . ALLERGIC RHINITIS 08/31/2008  . ASTHMA 08/31/2008  . GERD 08/31/2008  . OSTEOARTHRITIS 08/31/2008  . HYPOTHYROIDISM 08/30/2008  . MIGRAINE HEADACHE 08/30/2008  . FIBROMYALGIA 08/30/2008   Past Medical History  Diagnosis Date  . Allergic rhinitis due to pollen   . Anemia   . Fibromyalgia   . Cardiomyopathy   . Asthma   . Depression   . GERD (gastroesophageal reflux disease)   . Headache(784.0)   . Hypertension   . Hypothyroidism   . Osteoarthritis   . Restless leg syndrome   . Vertigo   .  IBS (irritable bowel syndrome)   . Family history of polycystic kidney 11/16/2012    neg CT   Past Surgical History  Procedure Laterality Date  . Abdominal hysterectomy    . Cesarean section    . Tubal ligation  1992  . Cholecystectomy  10-2008  . Shoulder arthroscopy w/ subacromial decompression and distal clavicle excision Right     rotator cuff repair  . Eye  surgery      2 2013 and 1981   History   Social History  . Marital Status: Married    Spouse Name: N/A    Number of Children: N/A  . Years of Education: N/A   Occupational History  . quality assurance tech    Social History Main Topics  . Smoking status: Former Smoker    Quit date: 05/06/1989  . Smokeless tobacco: Never Used     Comment: quit smoking 1990  . Alcohol Use: No  . Drug Use: No  . Sexual Activity: Not on file   Other Topics Concern  . Not on file   Social History Narrative   Former Dr. Redmond Pulling then Dr. Juventino Slovak patient    Dr. Rosemary Holms, podiatrist   Ortho--[facility]   Cards-- [facility] [place]   Uro--Dr. Era Bumpers   Family History  Problem Relation Age of Onset  . Cancer Mother     pancreatic   Allergies  Allergen Reactions  . Codeine   . Sulfonamide Derivatives    Medication list has been reviewed and updated.  Review of Systems:   General: Denies fever, chills, sweats. No significant weight loss. Trouble losing weight Eyes: Denies blurring,significant itching ENT: Denies earache, sore throat, and hoarseness.  Cardiovascular: Denies chest pains, palpitations, dyspnea on exertion,  Respiratory: Denies cough, dyspnea at rest,wheeezing Breast: no concerns about lumps GI: Denies nausea, vomiting, diarrhea, constipation, change in bowel habits, abdominal pain, melena, hematochezia GU: Denies dysuria, hematuria, urinary hesitancy, nocturia, denies STD risk, no concerns about discharge Musculoskeletal: as above Derm: Denies rash, itching Neuro: Denies  paresthesias, frequent falls, frequent headaches Psych: Denies depression, anxiety Endocrine: Denies cold intolerance, heat intolerance, polydipsia Heme: Denies enlarged lymph nodes Allergy: No hayfever  Objective:   Physical Examination: BP 120/90  Pulse 88  Temp(Src) 98.4 F (36.9 C) (Oral)  Ht 5' 3.5" (1.613 m)  Wt 225 lb 8 oz (102.286 kg)  BMI 39.31 kg/m2  No exam data  present  GEN: well developed, well nourished, no acute distress Eyes: conjunctiva and lids normal, PERRLA, EOMI ENT: TM clear, nares clear, oral exam WNL Neck: supple, no lymphadenopathy, no thyromegaly, no JVD Pulm: clear to auscultation and percussion, respiratory effort normal CV: regular rate and rhythm, S1-S2, no murmur, rub or gallop, no bruits Chest: no scars, masses, no lumps BREAST: breast exam declined GI: soft, non-tender; no hepatosplenomegaly, masses; active bowel sounds all quadrants GU: GU exam declined Lymph: no cervical, axillary or inguinal adenopathy MSK: gait normal, muscle tone and strength WNL, no joint swelling, effusions, discoloration, crepitus  SKIN: clear, good turgor, color WNL, no rashes, lesions, or ulcerations Neuro: normal mental status, normal strength, sensation, and motion Psych: alert; oriented to person, place and time, normally interactive and not anxious or depressed in appearance.   All labs reviewed with patient. Lipids:    Component Value Date/Time   CHOL 161 11/04/2013 1151   TRIG 133.0 11/04/2013 1151   HDL 44.40 11/04/2013 1151   VLDL 26.6 11/04/2013 1151   CHOLHDL 4 11/04/2013 1151   CBC:  CBC Latest Ref Rng 11/04/2013 02/06/2012 11/02/2010  WBC 4.0 - 10.5 K/uL 6.9 8.2 6.4  Hemoglobin 12.0 - 15.0 g/dL 13.4 11.8(L) 13.2  Hematocrit 36.0 - 46.0 % 39.5 35.2(L) 39.0  Platelets 150.0 - 400.0 K/uL 258.0 281.0 539.7    Basic Metabolic Panel:    Component Value Date/Time   NA 140 11/04/2013 1151   K 3.7 11/04/2013 1151   CL 107 11/04/2013 1151   CO2 28 11/04/2013 1151   BUN 12 11/04/2013 1151   CREATININE 0.8 11/04/2013 1151   GLUCOSE 116* 11/04/2013 1151   CALCIUM 9.3 11/04/2013 1151   Hepatic Function Latest Ref Rng 11/04/2013 02/06/2012 11/02/2010  Total Protein 6.0 - 8.3 g/dL 6.8 6.4 6.4  Albumin 3.5 - 5.2 g/dL 3.7 3.4(L) 3.8  AST 0 - 37 U/L _0 ALT 0 - 35 U/L _1 Alk Phosphatase 39 - 117 U/L 103 76 68  Total Bilirubin 0.2 - 1.2 mg/dL 0.9 0.5  0.7  Bilirubin, Direct 0.0 - 0.3 mg/dL 0.1 0.1 0.1    Lab Results  Component Value Date   TSH 5.02* 11/04/2013  recent additional thyroid labs are normal  Results for orders placed in visit on 11/11/13  HIGH SENSITIVITY CRP      Result Value Ref Range   CRP, High Sensitivity 4.320  0.000 - 5.000 mg/L  SEDIMENTATION RATE      Result Value Ref Range   Sed Rate 43 (*) 0 - 22 mm/hr  ANA      Result Value Ref Range   ANA NEG  NEGATIVE  CYCLIC CITRUL PEPTIDE ANTIBODY, IGG      Result Value Ref Range   Cyclic Citrullin Peptide Ab <2.0  0.0 - 5.0 U/mL  RHEUMATOID FACTOR      Result Value Ref Range   Rheumatoid Factor <10  <=14 IU/mL     Assessment & Plan:   Routine general medical examination at a health care facility  Polyarthropathy - Plan: High sensitivity CRP, Sedimentation rate, ANA, Cyclic citrul peptide antibody, IgG, Rheumatoid factor  >25 minutes spent in face to face time with patient, >50% spent in counselling or coordination of care: over and above traditional health maintenance exam. Time spent in the evaluation and discussion of extensive laboratories, polyarthropathy, potential autoimmune conditions, potential rheumatological conditions, and weight loss and diet.  She does have a esr of 91 and with her symptoms, I would like to have Rheumatology see her to help define if she does have systemic Rheumatological condition or not. Basic Rh and ANA are negative.   OSTEOARTHRITIS  Health Maintenance Exam: The patient's preventative maintenance and recommended screening tests for an annual wellness exam were reviewed in full today. Brought up to date unless services declined.  Counselled on the importance of diet, exercise, and its role in overall health and mortality. The patient's FH and SH was reviewed, including their home life, tobacco status, and drug and alcohol status.  Total face to face time 60 minutes.  Follow-up: No Follow-up on file. Or follow-up in 1 year  for complete physical examination  New Prescriptions   No medications on file   Modified Medications   No medications on file   Orders Placed This Encounter  Procedures  . High sensitivity CRP  . Sedimentation rate  . ANA  . Cyclic citrul peptide antibody, IgG  . Rheumatoid factor    Signed,  Jamair Cato T. Jahnia Hewes, MD, CAQ Sports Medicine   Discontinued Medications  CIPROFLOXACIN (CIPRO) 250 MG TABLET    Take 1 tablet (250 mg total) by mouth 2 (two) times daily.   RESVERATROL PO    Take by mouth 2 (two) times daily.   Current Medications at Discharge:   Medication List       This list is accurate as of: 11/11/13 11:59 PM.  Always use your most recent med list.               BERBERINE COMPLEX PO  Take 1-2 capsules by mouth daily.     buPROPion 300 MG 24 hr tablet  Commonly known as:  WELLBUTRIN XL  TAKE 1 TABLET BY MOUTH DAILY     ciprofloxacin 250 MG tablet  Commonly known as:  CIPRO  Take 1 tablet (250 mg total) by mouth 2 (two) times daily.     cyclobenzaprine 5 MG tablet  Commonly known as:  FLEXERIL  TAKE 1 TABLET BY MOUTH 3 TIMES A DAY AS NEEDED FOR MUSCLE SPASMS.     doxycycline 100 MG capsule  Commonly known as:  VIBRAMYCIN  Take 100 mg by mouth 2 (two) times daily.     FIRST-TESTOSTERONE 2 % Oint  Generic drug:  Testosterone Propionate  Place onto the skin.     hydrocortisone 2.5 % rectal cream  Commonly known as:  ANUSOL-HC  Place rectally 2 (two) times daily.     ibuprofen 800 MG tablet  Commonly known as:  ADVIL,MOTRIN  Take 800 mg by mouth At the onset of headache     levothyroxine 75 MCG tablet  Commonly known as:  SYNTHROID, LEVOTHROID  TAKE 1 TABLET BY MOUTH DAILY     losartan-hydrochlorothiazide 50-12.5 MG per tablet  Commonly known as:  HYZAAR  TAKE 1 TABLET BY MOUTH DAILY     meloxicam 7.5 MG tablet  Commonly known as:  MOBIC  TAKE ONE (1) TABLET BY MOUTH EACH DAY     NALTREXONE HCL PO  Take by mouth at bedtime.     NON  FORMULARY  Take 2 capsules by mouth daily. Adrenplus 331m     nystatin-triamcinolone ointment  Commonly known as:  MYCOLOG     omeprazole 20 MG capsule  Commonly known as:  PRILOSEC  TAKE ONE CAPSULE BY MOUTH DAILY     progesterone 200 MG capsule  Commonly known as:  PROMETRIUM  Take 200 mg by mouth daily.     Vitamin D-3 5000 UNITS Tabs  Take 1 capsule by mouth daily.

## 2013-11-12 LAB — HIGH SENSITIVITY CRP: CRP, High Sensitivity: 4.32 mg/L (ref 0.000–5.000)

## 2013-11-12 LAB — SEDIMENTATION RATE: Sed Rate: 43 mm/hr — ABNORMAL HIGH (ref 0–22)

## 2013-11-12 LAB — RHEUMATOID FACTOR: Rhuematoid fact SerPl-aCnc: <10 IU/mL (ref <=14)

## 2013-11-12 LAB — ANA: Anti Nuclear Antibody(ANA): NEGATIVE

## 2013-11-12 LAB — CYCLIC CITRUL PEPTIDE ANTIBODY, IGG: Cyclic Citrullin Peptide Ab: <2 U/mL (ref 0.0–5.0)

## 2013-11-20 ENCOUNTER — Other Ambulatory Visit: Payer: Self-pay | Admitting: Family Medicine

## 2013-12-03 ENCOUNTER — Encounter: Payer: Self-pay | Admitting: Internal Medicine

## 2013-12-03 ENCOUNTER — Other Ambulatory Visit: Payer: Self-pay | Admitting: Internal Medicine

## 2013-12-05 ENCOUNTER — Other Ambulatory Visit: Payer: Self-pay | Admitting: Family Medicine

## 2014-01-17 ENCOUNTER — Other Ambulatory Visit: Payer: Self-pay | Admitting: Family Medicine

## 2014-02-18 ENCOUNTER — Other Ambulatory Visit: Payer: Self-pay | Admitting: Family Medicine

## 2014-02-21 ENCOUNTER — Other Ambulatory Visit: Payer: Self-pay | Admitting: Family Medicine

## 2014-02-21 ENCOUNTER — Other Ambulatory Visit: Payer: Self-pay

## 2014-02-21 DIAGNOSIS — Z1231 Encounter for screening mammogram for malignant neoplasm of breast: Secondary | ICD-10-CM

## 2014-02-21 NOTE — Telephone Encounter (Signed)
Last office visit 11/11/2013.  Last refilled 06.28.2015 for #30 with 2 refills.  Ok to refill?

## 2014-02-25 ENCOUNTER — Ambulatory Visit
Admission: RE | Admit: 2014-02-25 | Discharge: 2014-02-25 | Disposition: A | Discharge status: Home / Self Care | Payer: 59 | Source: Ambulatory Visit

## 2014-02-25 DIAGNOSIS — Z1231 Encounter for screening mammogram for malignant neoplasm of breast: Secondary | ICD-10-CM

## 2014-04-13 DIAGNOSIS — H04569 Stenosis of unspecified lacrimal punctum: Secondary | ICD-10-CM | POA: Insufficient documentation

## 2014-04-13 DIAGNOSIS — H04559 Acquired stenosis of unspecified nasolacrimal duct: Secondary | ICD-10-CM | POA: Insufficient documentation

## 2014-05-06 HISTORY — PX: LUMBAR DISC SURGERY: SHX700

## 2014-05-10 ENCOUNTER — Other Ambulatory Visit: Payer: Self-pay | Admitting: Family Medicine

## 2014-07-06 ENCOUNTER — Other Ambulatory Visit: Payer: Self-pay | Admitting: Family Medicine

## 2014-08-03 ENCOUNTER — Other Ambulatory Visit: Payer: Self-pay | Admitting: Family Medicine

## 2014-08-03 NOTE — Telephone Encounter (Signed)
Last office visit 11/11/2013.  Last refilled 05/10/2014 for #30 with 2 refills.  Ok to refill?

## 2014-09-01 ENCOUNTER — Other Ambulatory Visit: Payer: Self-pay | Admitting: Family Medicine

## 2014-09-01 NOTE — Telephone Encounter (Signed)
Last office visit 11/11/2013.  Last refilled 04/16/2012 for #30 with 5 refills.  Ok to refill?

## 2014-10-02 ENCOUNTER — Emergency Department (INDEPENDENT_AMBULATORY_CARE_PROVIDER_SITE_OTHER): Payer: 59

## 2014-10-02 ENCOUNTER — Emergency Department (INDEPENDENT_AMBULATORY_CARE_PROVIDER_SITE_OTHER)
Admission: EM | Admit: 2014-10-02 | Discharge: 2014-10-02 | Payer: 59 | Source: Home / Self Care | Attending: Emergency Medicine | Admitting: Emergency Medicine

## 2014-10-02 ENCOUNTER — Encounter (HOSPITAL_COMMUNITY): Payer: Self-pay | Admitting: *Deleted

## 2014-10-02 ENCOUNTER — Emergency Department (HOSPITAL_COMMUNITY)
Admission: EM | Admit: 2014-10-02 | Discharge: 2014-10-02 | Disposition: A | Discharge status: Home / Self Care | Payer: 59 | Attending: Emergency Medicine | Admitting: Emergency Medicine

## 2014-10-02 ENCOUNTER — Encounter (HOSPITAL_COMMUNITY): Payer: Self-pay | Admitting: Emergency Medicine

## 2014-10-02 ENCOUNTER — Emergency Department (HOSPITAL_COMMUNITY): Payer: 59

## 2014-10-02 DIAGNOSIS — R292 Abnormal reflex: Secondary | ICD-10-CM | POA: Diagnosis not present

## 2014-10-02 DIAGNOSIS — Z79899 Other long term (current) drug therapy: Secondary | ICD-10-CM | POA: Diagnosis not present

## 2014-10-02 DIAGNOSIS — J45909 Unspecified asthma, uncomplicated: Secondary | ICD-10-CM | POA: Diagnosis not present

## 2014-10-02 DIAGNOSIS — M5186 Other intervertebral disc disorders, lumbar region: Secondary | ICD-10-CM | POA: Insufficient documentation

## 2014-10-02 DIAGNOSIS — M1990 Unspecified osteoarthritis, unspecified site: Secondary | ICD-10-CM | POA: Diagnosis not present

## 2014-10-02 DIAGNOSIS — K219 Gastro-esophageal reflux disease without esophagitis: Secondary | ICD-10-CM | POA: Insufficient documentation

## 2014-10-02 DIAGNOSIS — Z792 Long term (current) use of antibiotics: Secondary | ICD-10-CM | POA: Diagnosis not present

## 2014-10-02 DIAGNOSIS — M797 Fibromyalgia: Secondary | ICD-10-CM | POA: Diagnosis not present

## 2014-10-02 DIAGNOSIS — Z8669 Personal history of other diseases of the nervous system and sense organs: Secondary | ICD-10-CM | POA: Insufficient documentation

## 2014-10-02 DIAGNOSIS — M5441 Lumbago with sciatica, right side: Secondary | ICD-10-CM

## 2014-10-02 DIAGNOSIS — F329 Major depressive disorder, single episode, unspecified: Secondary | ICD-10-CM | POA: Insufficient documentation

## 2014-10-02 DIAGNOSIS — Z7952 Long term (current) use of systemic steroids: Secondary | ICD-10-CM | POA: Insufficient documentation

## 2014-10-02 DIAGNOSIS — M6281 Muscle weakness (generalized): Secondary | ICD-10-CM | POA: Diagnosis not present

## 2014-10-02 DIAGNOSIS — M549 Dorsalgia, unspecified: Secondary | ICD-10-CM

## 2014-10-02 DIAGNOSIS — M545 Low back pain: Secondary | ICD-10-CM | POA: Diagnosis present

## 2014-10-02 DIAGNOSIS — Z87891 Personal history of nicotine dependence: Secondary | ICD-10-CM | POA: Diagnosis not present

## 2014-10-02 DIAGNOSIS — I10 Essential (primary) hypertension: Secondary | ICD-10-CM | POA: Insufficient documentation

## 2014-10-02 DIAGNOSIS — E039 Hypothyroidism, unspecified: Secondary | ICD-10-CM | POA: Diagnosis not present

## 2014-10-02 DIAGNOSIS — R531 Weakness: Secondary | ICD-10-CM | POA: Diagnosis not present

## 2014-10-02 DIAGNOSIS — Z862 Personal history of diseases of the blood and blood-forming organs and certain disorders involving the immune mechanism: Secondary | ICD-10-CM | POA: Diagnosis not present

## 2014-10-02 DIAGNOSIS — M5136 Other intervertebral disc degeneration, lumbar region: Secondary | ICD-10-CM

## 2014-10-02 DIAGNOSIS — M5126 Other intervertebral disc displacement, lumbar region: Secondary | ICD-10-CM

## 2014-10-02 MED ORDER — MORPHINE SULFATE 2 MG/ML IJ SOLN
INTRAMUSCULAR | Status: AC
  Filled 2014-10-02: qty 1

## 2014-10-02 MED ORDER — METHYLPREDNISOLONE ACETATE 80 MG/ML IJ SUSP
80.0000 mg | Freq: Once | INTRAMUSCULAR | Status: AC
  Administered 2014-10-02: 80 mg via INTRAMUSCULAR

## 2014-10-02 MED ORDER — MORPHINE SULFATE 2 MG/ML IJ SOLN
2.0000 mg | Freq: Once | INTRAMUSCULAR | Status: AC
  Administered 2014-10-02: 2 mg via INTRAMUSCULAR

## 2014-10-02 MED ORDER — HYDROMORPHONE HCL 1 MG/ML IJ SOLN
1.0000 mg | Freq: Once | INTRAMUSCULAR | Status: AC
  Administered 2014-10-02: 1 mg via INTRAVENOUS
  Filled 2014-10-02: qty 1

## 2014-10-02 MED ORDER — OXYCODONE-ACETAMINOPHEN 5-325 MG PO TABS: 1.0000 | ORAL_TABLET | ORAL | Status: DC | PRN

## 2014-10-02 MED ORDER — METHYLPREDNISOLONE ACETATE 80 MG/ML IJ SUSP
INTRAMUSCULAR | Status: AC
  Filled 2014-10-02: qty 1

## 2014-10-02 MED ORDER — HYDROMORPHONE HCL 1 MG/ML IJ SOLN
1.0000 mg | Freq: Once | INTRAMUSCULAR | Status: AC
Start: 2014-10-02 — End: 2014-10-02
  Administered 2014-10-02: 1 mg via INTRAMUSCULAR
  Filled 2014-10-02: qty 1

## 2014-10-02 MED ORDER — METHYLPREDNISOLONE 4 MG PO TBPK: ORAL_TABLET | ORAL | Status: DC

## 2014-10-02 MED ORDER — DEXAMETHASONE SODIUM PHOSPHATE 10 MG/ML IJ SOLN
10.0000 mg | Freq: Once | INTRAMUSCULAR | Status: AC
Start: 2014-10-02 — End: 2014-10-02
  Administered 2014-10-02: 10 mg via INTRAMUSCULAR
  Filled 2014-10-02: qty 1

## 2014-10-02 MED ORDER — ONDANSETRON HCL 4 MG/2ML IJ SOLN
4.0000 mg | Freq: Once | INTRAMUSCULAR | Status: AC
  Administered 2014-10-02: 4 mg via INTRAVENOUS
  Filled 2014-10-02: qty 2

## 2014-10-02 NOTE — ED Notes (Signed)
Pt is here with right lower back started 08/31/14.  Then on Thursday noted right leg weakness when stepping out of shower and now is reporting numbness and weakness in the leg.  [facility] gave prednisone shot and morphine shot before coming down.

## 2014-10-02 NOTE — ED Notes (Signed)
Pt resting and speaking with husband at bedside; no needs at this time

## 2014-10-02 NOTE — ED Notes (Signed)
Pt remains out of room for testing 

## 2014-10-02 NOTE — ED Provider Notes (Signed)
CSN: [id number]     Arrival date & time 10/02/14  1203 History   First MD Initiated Contact with Patient 10/02/14 1226     Chief Complaint  Patient presents with  . Back Pain  . Extremity Weakness     (Consider location/radiation/quality/duration/timing/severity/associated sxs/prior Treatment) HPI Comments: Patient is 54 year old woman here with her husband for evaluation of back pain. She states this started on April 27 without injury or trauma. She reports pain in her mid lower back that radiates through her buttock and down the outside of her right leg. She has been seeing a chiropractor several times a week for this. She states she was starting to improve, but then 3 days ago she stepped out of her shower and had immediate recurrence of the pain. It is much more severe now. She continues to report pain radiating down the outside of her leg, but now also has pain radiating into the groin and medial knee. She reports occasional episodes of numbness in the lateral foot. She also reports in the last 3 days she has had difficulty standing up and getting her right leg into position to do her back stretches and exercises. No bowel or bladder incontinence. She has been taking ibuprofen and Mobic and Flexeril without improvement. She has continued to go to the chiropractor for readjustments.    Past Medical History  Diagnosis Date  . Allergic rhinitis due to pollen   . Anemia   . Fibromyalgia   . Cardiomyopathy   . Asthma   . Depression   . GERD (gastroesophageal reflux disease)   . Headache(784.0)   . Hypertension   . Hypothyroidism   . Osteoarthritis   . Restless leg syndrome   . Vertigo   . IBS (irritable bowel syndrome)   . Family history of polycystic kidney 11/16/2012    neg CT   Past Surgical History  Procedure Laterality Date  . Abdominal hysterectomy    . Cesarean section    . Tubal ligation  1992  . Cholecystectomy  10-2008  . Shoulder arthroscopy w/ subacromial  decompression and distal clavicle excision Right     rotator cuff repair  . Eye surgery      2 2013 and 1981/DCR of right eye 05/2014   Family History  Problem Relation Age of Onset  . Cancer Mother     pancreatic   History  Substance Use Topics  . Smoking status: Former Smoker    Quit date: 05/06/1989  . Smokeless tobacco: Never Used     Comment: quit smoking 1990  . Alcohol Use: No   OB History    No data available     Review of Systems  Musculoskeletal: Positive for back pain.  All other systems reviewed and are negative.     Allergies  Codeine and Sulfonamide derivatives  Home Medications   Prior to Admission medications   Medication Sig Start Date End Date Taking? Authorizing Provider  Barberry-Oreg Grape-Goldenseal (BERBERINE COMPLEX PO) Take 1-2 capsules by mouth daily.    Historical Provider, MD  buPROPion (WELLBUTRIN XL) 300 MG 24 hr tablet TAKE 1 TABLET BY MOUTH DAILY 07/06/14   [name], MD  Cholecalciferol (VITAMIN D-3) 5000 UNITS TABS Take 1 capsule by mouth daily.    Historical Provider, MD  ciprofloxacin (CIPRO) 250 MG tablet Take 1 tablet (250 mg total) by mouth 2 (two) times daily. 11/09/13   Judy Pimple, MD  cyclobenzaprine (FLEXERIL) 5 MG tablet TAKE 1 TABLET BY MOUTH 3  TIMES A DAY AS NEEDED FOR MUSCLE SPASMS. 02/21/14   [name], MD  doxycycline (VIBRAMYCIN) 100 MG capsule Take 100 mg by mouth 2 (two) times daily.    Historical Provider, MD  hydrocortisone (ANUSOL-HC) 2.5 % rectal cream Place rectally 2 (two) times daily. 02/24/12   [name], MD  ibuprofen (ADVIL,MOTRIN) 800 MG tablet TAKE 1 TABLET AT ONSET OF HEADACHE 09/01/14   [name], MD  levothyroxine (SYNTHROID, LEVOTHROID) 75 MCG tablet TAKE ONE (1) TABLET BY MOUTH EVERY DAY 12/05/13   [name], MD  losartan-hydrochlorothiazide (HYZAAR) 50-12.5 MG per tablet TAKE 1 TABLET BY MOUTH DAILY 05/10/14   [name], MD  meloxicam (MOBIC) 7.5 MG tablet TAKE 1 TABLET BY  MOUTH DAILY 08/04/14   [name], MD  methylPREDNISolone (MEDROL DOSEPAK) 4 MG TBPK tablet Day 1: 8 mg PO before breakfast, 4 mg after lunch, 4 mg after dinner, and 8 mg at bedtime Day 2: 4 mg PO before breakfast, 4 mg after lunch, 4 mg after dinner, and 8 mg at bedtime Day 3: 4 mg PO before breakfast, 4 mg after lunch, 4 mg after dinner, and 4 mg at bedtime Day 4: 4 mg PO before breakfast, 4 mg after lunch, and 4 mg at bedtime Day 5: 4 mg PO before breakfast, and 4 mg at bedtime Day 6: 4 mg PO before breakfast 10/02/14   Francee Piccolo, PA-C  NALTREXONE HCL PO Take by mouth at bedtime.    Historical Provider, MD  NON FORMULARY Take 2 capsules by mouth daily. Adrenplus     Historical Provider, MD  nystatin-triamcinolone ointment (MYCOLOG)  11/23/12   Historical Provider, MD  omeprazole (PRILOSEC) 20 MG capsule TAKE ONE (1) CAPSULE BY MOUTH EACH DAY 01/17/14   [name], MD  oxyCODONE-acetaminophen (PERCOCET/ROXICET) 5-325 MG per tablet Take 1 tablet by mouth every 4 (four) hours as needed for severe pain. May take 2 tablets PO q 6 hours for severe pain - Do not take with Tylenol as this tablet already contains tylenol 10/02/14   Francee Piccolo, PA-C  progesterone (PROMETRIUM) 200 MG capsule Take 200 mg by mouth daily.    Historical Provider, MD  Testosterone Propionate (FIRST-TESTOSTERONE) 2 % OINT Place onto the skin.    Historical Provider, MD   BP 115/65 mmHg  Pulse 91  Temp(Src) 97.9 F (36.6 C) (Oral)  Resp 16  SpO2 96% Physical Exam  Constitutional: She is oriented to person, place, and time. She appears well-developed and well-nourished. No distress.  HENT:  Head: Normocephalic and atraumatic.  Right Ear: External ear normal.  Left Ear: External ear normal.  Nose: Nose normal.  Mouth/Throat: Oropharynx is clear and moist. No oropharyngeal exudate.  Eyes: Conjunctivae and EOM are normal. Pupils are equal, round, and reactive to light.  Neck: Normal range of  motion. Neck supple.  Cardiovascular: Normal rate, regular rhythm, normal heart sounds and intact distal pulses.   Pulmonary/Chest: Effort normal and breath sounds normal. No respiratory distress.  Abdominal: Soft. There is no tenderness.  Musculoskeletal:       Lumbar back: She exhibits tenderness, bony tenderness and pain. She exhibits no deformity.       Back:  Antalgic gait.   Neurological: She is alert and oriented to person, place, and time. She has normal strength. No cranial nerve deficit. Gait normal. GCS eye subscore is 4. GCS verbal subscore is 5. GCS motor subscore is 6.  Sensation grossly intact.  No pronator drift.  Bilateral heel-knee-shin intact. No  foot drop.   Skin: Skin is warm and dry. She is not diaphoretic.  Nursing note and vitals reviewed.   ED Course  Procedures (including critical care time) Medications  HYDROmorphone (DILAUDID) injection 1 mg (1 mg Intravenous Given 10/02/14 1307)  ondansetron (ZOFRAN) injection 4 mg (4 mg Intravenous Given 10/02/14 1307)  dexamethasone (DECADRON) injection 10 mg (10 mg Intramuscular Given 10/02/14 1455)  HYDROmorphone (DILAUDID) injection 1 mg (1 mg Intramuscular Given 10/02/14 1457)    Labs Review Labs Reviewed - No data to display  Imaging Review Dg Lumbar Spine Complete  10/02/2014   CLINICAL DATA:  Low back pain  EXAM: LUMBAR SPINE - COMPLETE 4+ VIEW  COMPARISON:  12/01/2012  FINDINGS: Normal alignment. The vertebral body heights are well preserved. Ventral endplate spurring is identified throughout the lumbar spine. There is also a small posterior endplate spur arising from the L3 level. No fractures or dislocations identified.  IMPRESSION: 1. Mild lumbar spondylosis.   Electronically Signed   By: Signa Kell M.D.   On: 10/02/2014 11:34   Mr Lumbar Spine Wo Contrast  10/02/2014   CLINICAL DATA:  Right low back pain beginning 08/31/2014 with onset of right leg weakness and numbness 09/29/2014.  EXAM: MRI LUMBAR SPINE  WITHOUT CONTRAST  TECHNIQUE: Multiplanar, multisequence MR imaging of the lumbar spine was performed. No intravenous contrast was administered.  COMPARISON:  Plain films lumbar spine this same day and CT abdomen and pelvis 12/01/2012.  FINDINGS: Vertebral body height, signal are normal. The conus medullaris is normal in signal and position. Imaged intra-abdominal contents are unremarkable.  The T11-12 level is imaged in the sagittal plane only and negative.  T12-L1:  Negative.  L1-2: Minimal disc bulge without central canal or foraminal stenosis.  L2-3: Minimal disc bulge without central canal or foraminal narrowing.  L3-4: The patient has a disc bulge eccentric to the right with a superimposed down turning right lateral recess protrusion. The disc deforms right aspect of the thecal sac and impinges on the descending right L4 root. The foramina are open at this level.  L4-5: The patient has a shallow disc bulge and mild to moderate facet degenerative change. The central canal is mildly narrowed. There is also some narrowing in the lateral recesses which appears greater on the right.  L5-S1: Facet degenerative disease identified. This level is otherwise negative.  IMPRESSION: Disc bulge with a superimposed right lateral recess protrusion at L3-4 impinges on the descending right L4 root and deforms right aspect of the thecal sac.  Shallow disc bulge at L4-5 results in narrowing in the lateral recesses which could impact either descending L5 root.   Electronically Signed   By: Drusilla Kanner M.D.   On: 10/02/2014 14:20     EKG Interpretation None      MDM   Final diagnoses:  Back pain  Bulging lumbar disc    Filed Vitals:   10/02/14 1523  BP: 115/65  Pulse: 91  Temp:   Resp: 16   Afebrile, NAD, non-toxic appearing, AAOx4.   I have reviewed nursing notes, vital signs, and all appropriate lab and imaging results if ordered as above.   Patient presenting with worsening back pain from urgent  care center. Low back is tender to palpation, L-spine is tender to palpation without obvious deformity or step-offs. Sensation is grossly intact. No foot drop. No history of fevers, IV drug use, cancer or bladder or bowel incontinence. MRI obtained given the acute change in symptoms. L3-L4 bulging disc  appreciated without rupture or spinal compression. Pain improved while in emergency department. Patient is still able to ambulate. Will discharge with Medrol Dosepak and pain medication with advised PCP and neurosurgery follow-up. Discussed results with patient who is agreeable to plan discharge home. Patient is stable at time of discharge     Francee Piccolo, PA-C 10/02/14 1547  Arby Barrette, MD 10/02/14 [id number]

## 2014-10-02 NOTE — ED Notes (Signed)
Pt being transferred to [facility] ED for possible MRI/futher evaluation by shuttle Report given to [place]

## 2014-10-02 NOTE — ED Provider Notes (Signed)
CSN: [id number]     Arrival date & time 10/02/14  3646 History   First MD Initiated Contact with Patient 10/02/14 1028     Chief Complaint  Patient presents with  . Back Pain   (Consider location/radiation/quality/duration/timing/severity/associated sxs/prior Treatment) HPI She is a 54 year old woman here with her husband for evaluation of back pain. She states this started on April 27 without injury or trauma. She reports pain in her mid lower back that radiates through her buttock and down the outside of her right leg. She has been seeing a chiropractor several times a week for this. She states she was starting to improve, but then 3 days ago she stepped out of her shower and had immediate recurrence of the pain. It is much more severe now. She continues to report pain radiating down the outside of her leg, but now also has pain radiating into the groin and medial knee. She reports numbness in the lateral foot. She also reports in the last 3 days she has had difficulty standing up and getting her right leg into position to do her back stretches and exercises. No bowel or bladder incontinence. She has been taking ibuprofen and Mobic and Flexeril without improvement.  Past Medical History  Diagnosis Date  . Allergic rhinitis due to pollen   . Anemia   . Fibromyalgia   . Cardiomyopathy   . Asthma   . Depression   . GERD (gastroesophageal reflux disease)   . Headache(784.0)   . Hypertension   . Hypothyroidism   . Osteoarthritis   . Restless leg syndrome   . Vertigo   . IBS (irritable bowel syndrome)   . Family history of polycystic kidney 11/16/2012    neg CT   Past Surgical History  Procedure Laterality Date  . Abdominal hysterectomy    . Cesarean section    . Tubal ligation  1992  . Cholecystectomy  10-2008  . Shoulder arthroscopy w/ subacromial decompression and distal clavicle excision Right     rotator cuff repair  . Eye surgery      2 2013 and 1981   Family History   Problem Relation Age of Onset  . Cancer Mother     pancreatic   History  Substance Use Topics  . Smoking status: Former Smoker    Quit date: 05/06/1989  . Smokeless tobacco: Never Used     Comment: quit smoking 1990  . Alcohol Use: No   OB History    No data available     Review of Systems As in history of present illness Allergies  Codeine and Sulfonamide derivatives  Home Medications   Prior to Admission medications   Medication Sig Start Date End Date Taking? Authorizing Provider  Barberry-Oreg Grape-Goldenseal (BERBERINE COMPLEX PO) Take 1-2 capsules by mouth daily.    Historical Provider, MD  buPROPion (WELLBUTRIN XL) 300 MG 24 hr tablet TAKE 1 TABLET BY MOUTH DAILY 07/06/14   [name], MD  Cholecalciferol (VITAMIN D-3) 5000 UNITS TABS Take 1 capsule by mouth daily.    Historical Provider, MD  ciprofloxacin (CIPRO) 250 MG tablet Take 1 tablet (250 mg total) by mouth 2 (two) times daily. 11/09/13   Judy Pimple, MD  cyclobenzaprine (FLEXERIL) 5 MG tablet TAKE 1 TABLET BY MOUTH 3 TIMES A DAY AS NEEDED FOR MUSCLE SPASMS. 02/21/14   [name], MD  doxycycline (VIBRAMYCIN) 100 MG capsule Take 100 mg by mouth 2 (two) times daily.    Historical Provider, MD  hydrocortisone (  ANUSOL-HC) 2.5 % rectal cream Place rectally 2 (two) times daily. 02/24/12   [name], MD  ibuprofen (ADVIL,MOTRIN) 800 MG tablet TAKE 1 TABLET AT ONSET OF HEADACHE 09/01/14   [name], MD  levothyroxine (SYNTHROID, LEVOTHROID) 75 MCG tablet TAKE ONE (1) TABLET BY MOUTH EVERY DAY 12/05/13   [name], MD  losartan-hydrochlorothiazide (HYZAAR) 50-12.5 MG per tablet TAKE 1 TABLET BY MOUTH DAILY 05/10/14   [name], MD  meloxicam (MOBIC) 7.5 MG tablet TAKE 1 TABLET BY MOUTH DAILY 08/04/14   [name], MD  NALTREXONE HCL PO Take by mouth at bedtime.    Historical Provider, MD  NON FORMULARY Take 2 capsules by mouth daily. Adrenplus     Historical Provider, MD   nystatin-triamcinolone ointment (MYCOLOG)  11/23/12   Historical Provider, MD  omeprazole (PRILOSEC) 20 MG capsule TAKE ONE (1) CAPSULE BY MOUTH EACH DAY 01/17/14   [name], MD  progesterone (PROMETRIUM) 200 MG capsule Take 200 mg by mouth daily.    Historical Provider, MD  Testosterone Propionate (FIRST-TESTOSTERONE) 2 % OINT Place onto the skin.    Historical Provider, MD   BP 151/96 mmHg  Pulse 80  Temp(Src) 98 F (36.7 C) (Oral)  Resp 16  SpO2 100% Physical Exam  Constitutional: She is oriented to person, place, and time. She appears well-developed and well-nourished. She appears distressed (appears very uncomfortable. She is most comfortable when standing and leaning her upper body on the exam table).  Cardiovascular: Normal rate.   Pulmonary/Chest: Effort normal.  Musculoskeletal:  Back: No erythema or edema. She is tender in the right lower back and buttock. She has diminished patellar reflex on the right compared to the left. Equal Achilles reflexes. She has weakness in her right quadriceps. Positive seated straight leg raise on the right.  Neurological: She is alert and oriented to person, place, and time.    ED Course  Procedures (including critical care time) Labs Review Labs Reviewed - No data to display  Imaging Review Dg Lumbar Spine Complete  10/02/2014   CLINICAL DATA:  Low back pain  EXAM: LUMBAR SPINE - COMPLETE 4+ VIEW  COMPARISON:  12/01/2012  FINDINGS: Normal alignment. The vertebral body heights are well preserved. Ventral endplate spurring is identified throughout the lumbar spine. There is also a small posterior endplate spur arising from the L3 level. No fractures or dislocations identified.  IMPRESSION: 1. Mild lumbar spondylosis.   Electronically Signed   By: Signa Kell M.D.   On: 10/02/2014 11:34     MDM   1. Right-sided low back pain with right-sided sciatica   2. Decreased right patellar reflex   3. Quadriceps weakness    Morphine 2 mg IM  and 80 mg Depo-Medrol IM given.  She reports improvement in pain after medications. X-ray shows some arthritic changes, but is otherwise negative. I am concerned about a ruptured disc given objective weakness and diminished patellar reflex on the right. We'll transfer to [facility] ER for additional evaluation with MRI.  Charm Rings, MD 10/02/14 1153

## 2014-10-02 NOTE — ED Notes (Addendum)
Pt comes in with c/o lower back pain radiating down right leg/foot  Pt is following Chiro 4 times/weekly with Acupuncture No xrays done C/o sharp,shooting constant pain Sx's started 4/27 while at work. No injury noted

## 2014-10-02 NOTE — Discharge Instructions (Signed)
Please follow up with your primary care physician in 1-2 days. If you do not have one please call the [facility] number listed above. Please follow up with Dr. Jeral Fruit to schedule a follow up appointment.  Please take pain medication and/or muscle relaxants as prescribed and as needed for pain. Please do not drive on narcotic pain medication or on muscle relaxants. Please read all discharge instructions and return precautions.   Back Pain, Adult Low back pain is very common. About 1 in 5 people have back pain.The cause of low back pain is rarely dangerous. The pain often gets better over time.About half of people with a sudden onset of back pain feel better in just 2 weeks. About 8 in 10 people feel better by 6 weeks.  CAUSES Some common causes of back pain include:  Strain of the muscles or ligaments supporting the spine.  Wear and tear (degeneration) of the spinal discs.  Arthritis.  Direct injury to the back. DIAGNOSIS Most of the time, the direct cause of low back pain is not known.However, back pain can be treated effectively even when the exact cause of the pain is unknown.Answering your caregiver's questions about your overall health and symptoms is one of the most accurate ways to make sure the cause of your pain is not dangerous. If your caregiver needs more information, he or she may order lab work or imaging tests (X-rays or MRIs).However, even if imaging tests show changes in your back, this usually does not require surgery. HOME CARE INSTRUCTIONS For many people, back pain returns.Since low back pain is rarely dangerous, it is often a condition that people can learn to [facility] their own.   Remain active. It is stressful on the back to sit or stand in one place. Do not sit, drive, or stand in one place for more than 30 minutes at a time. Take short walks on level surfaces as soon as pain allows.Try to increase the length of time you walk each day.  Do  not stay in bed.Resting more than 1 or 2 days can delay your recovery.  Do not avoid exercise or work.Your body is made to move.It is not dangerous to be active, even though your back may hurt.Your back will likely heal faster if you return to being active before your pain is gone.  Pay attention to your body when you bend and lift. Many people have less discomfortwhen lifting if they bend their knees, keep the load close to their bodies,and avoid twisting. Often, the most comfortable positions are those that put less stress on your recovering back.  Find a comfortable position to sleep. Use a firm mattress and lie on your side with your knees slightly bent. If you lie on your back, put a pillow under your knees.  Only take over-the-counter or prescription medicines as directed by your caregiver. Over-the-counter medicines to reduce pain and inflammation are often the most helpful.Your caregiver may prescribe muscle relaxant drugs.These medicines help dull your pain so you can more quickly return to your normal activities and healthy exercise.  Put ice on the injured area.  Put ice in a plastic bag.  Place a towel between your skin and the bag.  Leave the ice on for 15-20 minutes, 03-04 times a day for the first 2 to 3 days. After that, ice and heat may be alternated to reduce pain and spasms.  Ask your caregiver about trying back exercises and gentle massage. This may be  of some benefit.  Avoid feeling anxious or stressed.Stress increases muscle tension and can worsen back pain.It is important to recognize when you are anxious or stressed and learn ways to manage it.Exercise is a great option. SEEK MEDICAL CARE IF:  You have pain that is not relieved with rest or medicine.  You have pain that does not improve in 1 week.  You have new symptoms.  You are generally not feeling well. SEEK IMMEDIATE MEDICAL CARE IF:   You have pain that radiates from your back into your  legs.  You develop new bowel or bladder control problems.  You have unusual weakness or numbness in your arms or legs.  You develop nausea or vomiting.  You develop abdominal pain.  You feel faint. Document Released: 04/22/2005 Document Revised: 10/22/2011 Document Reviewed: 08/24/2013 [facility] Patient Information 2015 [place], [place]. This information is not intended to replace advice given to you by your health care provider. Make sure you discuss any questions you have with your health care provider. Herniated Disk A herniated disk occurs when a disk in your spine bulges out too far. This condition is also called a ruptured disk or slipped disk. Your spine (backbone) is made up of bones called vertebrae. Between each pair of vertebrae is an oval disk with a soft, spongy center that acts as a shock absorber when you move. The spongy center is surrounded by a tough outer ring. When you have a herniated disk, the spongy center of the disk bulges out or ruptures through the outer ring. A herniated disk can press on a nerve between your vertebrae and cause pain. A herniated disk can occur anywhere in your back or neck area, but the lower back is the most common spot. CAUSES  In many cases, a herniated disk occurs just from getting older. As you age, the spongy insides of your disks tend to shrink and dry out. A herniated disk can result from gradual wear and tear. Injury or sudden strain can also cause a herniated disk.  RISK FACTORS Aging is the main risk factor for a herniated disk. Other risk factors include:  Being a man between the ages of 23 and 58 years.  Having a job that requires heavy lifting, bending, or twisting.  Having a job that requires long hours of driving.  Not getting enough exercise.  Being overweight.  Smoking. SIGNS AND SYMPTOMS  Signs and symptoms depend on which disk is herniated.  For a herniated disk in the lower back, you may have sharp pain in:  One  part of your leg, hip, or buttocks.  The back of your calf.  The top or sole of your foot (sciatica).   For a herniated disk in the neck, you may feel pain:  When you move your neck.  Near or over your shoulder blade.  That moves to your upper arm, forearm, or fingers.   You may also have muscle weakness. It may be hard to:  Lift your leg or arm.  Stand on your toes.  Squeeze tightly with one of your hands.  Other symptoms can include:  Numbness or tingling in the affected areas of your body.  Loss of bladder or bowel control. This is a rare but serious sign of a severe herniated disk in the lower back. DIAGNOSIS  Your health care provider will do a physical exam. During this exam, you may have to move certain body parts or assume various positions. For example, your health care provider  may do the straight-leg test. This is a good way to test for a herniated disk in your lower back. In this test, the health care provider lifts your leg while you lie on your back. This is to see if you feel pain down your leg. Your health care provider will also check for numbness or loss of feeling.  Your health care provider will also check your:  Reflexes.  Muscle strength.  Posture.  Other tests may be done to help in making a diagnosis. These may include:  An X-ray of the spine to rule out other causes of back pain.   Other imaging studies, such as an MRI or CT scan. This is to check whether the herniated disk is pressing on your spinal canal.  Electromyography (EMG). This test checks the nerves that control muscles. It is sometimes used to identify the specific area of nerve involvement.  TREATMENT  In many cases, herniated disk symptoms go away over a period of days or weeks. You will most likely be free of symptoms in 3-4 months. Treatment may include the following:  The initial treatment for a herniated disk is ashort period of rest.  Bed rest is often limited to 1 or 2  days. Resting for too long delays recovery.  If you have a herniated disk in your lower back, you should avoid sitting as much as possible because sitting increases pressure on the disk.  Medicines. These may include:   Nonsteroidal anti-inflammatory drugs (NSAIDs).  Muscle relaxants for back spasms.  Narcotic pain medicine if your pain is very bad.   Steroid injections. You may need these along the involved nerve root to help control pain. The steroid is injected in the area of the herniated disk. It helps by reducing swelling around the disk.  Physical therapy. This may include exercises to strengthen the muscles that help support your spine.   You may need surgery if other treatments do not work.  HOME CARE INSTRUCTIONS Follow all your health care provider's instructions. These may include:  Take all medicines as directed by your health care provider.  Rest for 2 days and then start moving.  Do not sit or stand for long periods of time.  Maintain good posture when sitting and standing.  Avoid movements that cause pain, such as bending or lifting.  When you are able to start lifting things again:  [place] with your knees.  Keep your back straight.  Hold heavy objects close to your body.  If you are overweight, ask your health care provider to help you start a weight-loss program.  When you are able to start exercising, ask your health care provider how much and what type of exercise is best for you.  Work with a physical therapist on stretching and strengthening exercises for your back.  Do not wear high-heeled shoes.  Do not sleep on your belly.  Do not smoke.  Keep all follow-up visits as directed by your health care provider. SEEK MEDICAL CARE IF:  You have back or neck pain that is not getting better after 4 weeks.  You have very bad pain in your back or neck.  You develop numbness, tingling, or weakness along with pain. SEEK IMMEDIATE MEDICAL CARE IF:    You have numbness, tingling, or weakness that makes you unable to use your arms or legs.  You lose control of your bladder or bowels.  You have dizziness or fainting.  You have shortness of breath.  MAKE SURE  YOU:   Understand these instructions.  Will watch your condition.  Will get help right away if you are not doing well or get worse. Document Released: 04/19/2000 Document Revised: 09/06/2013 Document Reviewed: 03/26/2013 [facility] Patient Information 2015 [place], [place]. This information is not intended to replace advice given to you by your health care provider. Make sure you discuss any questions you have with your health care provider.

## 2014-10-06 ENCOUNTER — Other Ambulatory Visit: Payer: Self-pay | Admitting: Neurosurgery

## 2014-10-06 ENCOUNTER — Ambulatory Visit
Admission: RE | Admit: 2014-10-06 | Discharge: 2014-10-06 | Disposition: A | Discharge status: Home / Self Care | Payer: 59 | Source: Ambulatory Visit | Attending: Neurosurgery | Admitting: Neurosurgery

## 2014-10-06 DIAGNOSIS — M545 Low back pain, unspecified: Secondary | ICD-10-CM

## 2014-10-06 MED ORDER — METHYLPREDNISOLONE ACETATE 40 MG/ML INJ SUSP (RADIOLOG
120.0000 mg | Freq: Once | INTRAMUSCULAR | Status: AC
  Administered 2014-10-06: 120 mg via EPIDURAL

## 2014-10-06 MED ORDER — IOHEXOL 180 MG/ML  SOLN
1.0000 mL | Freq: Once | INTRAMUSCULAR | Status: AC | PRN
  Administered 2014-10-06: 1 mL via EPIDURAL

## 2014-10-06 NOTE — Discharge Instructions (Signed)

## 2014-10-14 ENCOUNTER — Other Ambulatory Visit: Payer: Self-pay | Admitting: Neurosurgery

## 2014-10-14 DIAGNOSIS — M5127 Other intervertebral disc displacement, lumbosacral region: Secondary | ICD-10-CM

## 2014-10-19 ENCOUNTER — Ambulatory Visit
Admission: RE | Admit: 2014-10-19 | Discharge: 2014-10-19 | Disposition: A | Discharge status: Home / Self Care | Payer: 59 | Source: Ambulatory Visit | Attending: Neurosurgery | Admitting: Neurosurgery

## 2014-10-19 ENCOUNTER — Other Ambulatory Visit: Payer: Self-pay | Admitting: Neurosurgery

## 2014-10-19 DIAGNOSIS — M5127 Other intervertebral disc displacement, lumbosacral region: Secondary | ICD-10-CM

## 2014-10-19 MED ORDER — IOHEXOL 180 MG/ML  SOLN
1.0000 mL | Freq: Once | INTRAMUSCULAR | Status: AC | PRN
  Administered 2014-10-19: 1 mL via EPIDURAL

## 2014-10-19 MED ORDER — METHYLPREDNISOLONE ACETATE 40 MG/ML INJ SUSP (RADIOLOG
120.0000 mg | Freq: Once | INTRAMUSCULAR | Status: AC
  Administered 2014-10-19: 120 mg via EPIDURAL

## 2014-10-28 ENCOUNTER — Other Ambulatory Visit: Payer: Self-pay | Admitting: Family Medicine

## 2014-10-28 NOTE — Telephone Encounter (Signed)
Refill denied, need appt and not on med list

## 2014-11-09 ENCOUNTER — Telehealth: Payer: Self-pay

## 2014-11-09 MED ORDER — GLUCOSE BLOOD VI STRP: ORAL_STRIP | Status: DC

## 2014-11-09 NOTE — Telephone Encounter (Signed)
Prescription sent as requested.

## 2014-11-09 NOTE — Telephone Encounter (Signed)
Amy at [facility] pharmacy left v/m that Dr Patsy Lager had sent pt to [facility] diabetic classes; pt is interested in starting testing BS once daily at home. Amy request rx sent for onetouch ultra test strips with instructions test BS once daily to [facility].Please advise.

## 2014-11-15 ENCOUNTER — Telehealth: Payer: Self-pay

## 2014-11-15 NOTE — Telephone Encounter (Signed)
[name] at [facility] diabetic teaching request most recent A1C for pt; advised Kristine Curtis to contact [facility] at [phone]. [name] voiced understanding.

## 2014-11-21 ENCOUNTER — Encounter: Payer: Self-pay | Admitting: Family Medicine

## 2014-11-21 ENCOUNTER — Ambulatory Visit (INDEPENDENT_AMBULATORY_CARE_PROVIDER_SITE_OTHER): Payer: 59 | Admitting: Family Medicine

## 2014-11-21 VITALS — BP 115/70 | HR 82 | Temp 98.4°F | Ht 63.5 in | Wt 213.8 lb

## 2014-11-21 DIAGNOSIS — F322 Major depressive disorder, single episode, severe without psychotic features: Secondary | ICD-10-CM

## 2014-11-21 DIAGNOSIS — M797 Fibromyalgia: Secondary | ICD-10-CM | POA: Diagnosis not present

## 2014-11-21 DIAGNOSIS — E038 Other specified hypothyroidism: Secondary | ICD-10-CM

## 2014-11-21 DIAGNOSIS — G43009 Migraine without aura, not intractable, without status migrainosus: Secondary | ICD-10-CM

## 2014-11-21 DIAGNOSIS — M150 Primary generalized (osteo)arthritis: Secondary | ICD-10-CM

## 2014-11-21 DIAGNOSIS — M159 Polyosteoarthritis, unspecified: Secondary | ICD-10-CM

## 2014-11-21 DIAGNOSIS — M8949 Other hypertrophic osteoarthropathy, multiple sites: Secondary | ICD-10-CM

## 2014-11-21 DIAGNOSIS — R739 Hyperglycemia, unspecified: Secondary | ICD-10-CM

## 2014-11-21 DIAGNOSIS — I10 Essential (primary) hypertension: Secondary | ICD-10-CM

## 2014-11-21 DIAGNOSIS — M3500 Sicca syndrome, unspecified: Secondary | ICD-10-CM | POA: Diagnosis not present

## 2014-11-21 DIAGNOSIS — F325 Major depressive disorder, single episode, in full remission: Secondary | ICD-10-CM

## 2014-11-21 LAB — T4, FREE: Free T4: 1.13 ng/dL (ref 0.60–1.60)

## 2014-11-21 LAB — T3, FREE: T3, Free: 3.6 pg/mL (ref 2.3–4.2)

## 2014-11-21 LAB — HEMOGLOBIN A1C: Hgb A1c MFr Bld: 5.2 % (ref 4.6–6.5)

## 2014-11-21 LAB — TSH: TSH: 2.42 u[IU]/mL (ref 0.35–4.50)

## 2014-11-21 NOTE — Progress Notes (Signed)
Pre visit review using our clinic review tool, if applicable. No additional management support is needed unless otherwise documented below in the visit note. 

## 2014-11-21 NOTE — Progress Notes (Signed)
Dr. Frederico Hamman T. Dinnis Rog, MD, [facility] [place], [address] Phone: [phone] Fax: [phone]  11/21/2014  Patient: Kristine Curtis, MRN: [id number], DOB: [birth year], 54 y.o.  Primary Physician:  Owens Loffler, MD  Chief Complaint: Discuss long term disability  Subjective:   Kristine Curtis is a 54 y.o. very pleasant female patient who presents with the following:  Has a lot of illnesses, did not realize th eseverity of how they affected her. She had a microdiscectomy of L3 for approximately 3 weeks ago, and she is been out of work for some time, she has lost weight.  She had been in severe, excruciating pain, with numbness in her right leg also.  Pain has dramatically improved post microdiscectomy.  She still is having some numbness.  4/27 - stood up from break table at work and feet would not move.  Did chiropractic -  Accupuncture,  No reflexes in R leg - sent to Dr. Joya Salm.   Laid in bed crying for 12 hours, harder to go to work.  Did 2 ESI in the lower back, they did not help at all.  Then saw Dr. Ellene Route.  Operated L3-4 lumbar, microdiscectomy.  11/04/2014 operation. Pain is much better.  Still some lateral and dorsal numbness.   From a Rheum perspective, has seen Dr. Charlestine Night 1 year ago and has been seeing Dr. Annabelle Harman at [facility] recently. Current dx is SICCA without definitive Sjogrens and no lip biopsy All systemic Rheum work-up shows no systemic connective tissue disorder such as lupus, RA.  Past Medical History, Surgical History, Social History, Family History, Problem List, Medications, and Allergies have been reviewed and updated if relevant.  Patient Active Problem List   Diagnosis Date Noted  . Acquired nasolacrimal duct obstruction 04/13/2014  . Family history of polycystic kidney 11/16/2012  . UNSPECIFIED VITAMIN D DEFICIENCY 10/09/2009  . FATIGUE 05/22/2009  . OTHER AND UNSPECIFIED HYPERLIPIDEMIA  11/15/2008  . ANEMIA-NOS 08/31/2008  . Major depression in complete remission 08/31/2008  . HYPERTENSION 08/31/2008  . ALLERGIC RHINITIS 08/31/2008  . ASTHMA 08/31/2008  . GERD 08/31/2008  . OSTEOARTHRITIS 08/31/2008  . HYPOTHYROIDISM 08/30/2008  . MIGRAINE HEADACHE 08/30/2008  . FIBROMYALGIA 08/30/2008    Past Medical History  Diagnosis Date  . Allergic rhinitis due to pollen   . Anemia   . Fibromyalgia   . Cardiomyopathy   . Asthma   . Depression   . GERD (gastroesophageal reflux disease)   . Headache(784.0)   . Hypertension   . Hypothyroidism   . Osteoarthritis   . Restless leg syndrome   . Vertigo   . IBS (irritable bowel syndrome)   . Family history of polycystic kidney 11/16/2012    neg CT    Past Surgical History  Procedure Laterality Date  . Abdominal hysterectomy    . Cesarean section    . Tubal ligation  1992  . Cholecystectomy  10-2008  . Shoulder arthroscopy w/ subacromial decompression and distal clavicle excision Right     rotator cuff repair  . Eye surgery      2 2013 and 1981/DCR of right eye 05/2014    History   Social History  . Marital Status: Married    Spouse Name: N/A  . Number of Children: N/A  . Years of Education: N/A   Occupational History  . quality assurance tech    Social History Main Topics  . Smoking status: Former Smoker  Quit date: 05/06/1989  . Smokeless tobacco: Never Used     Comment: quit smoking 1990  . Alcohol Use: No  . Drug Use: No  . Sexual Activity: Not on file   Other Topics Concern  . Not on file   Social History Narrative   Former Dr. Redmond Pulling then Dr. Juventino Slovak patient    Dr. Rosemary Holms, podiatrist   Ortho--[facility]   Cards-- [facility] [place]   Uro--Dr. Era Bumpers    Family History  Problem Relation Age of Onset  . Cancer Mother     pancreatic    Allergies  Allergen Reactions  . Gabapentin (Once-Daily)     "side effect to severe"  . Codeine Itching  . Sulfonamide Derivatives  Itching    Medication list reviewed and updated in full in [place].   GEN: No acute illnesses, no fevers, chills. GI: No n/v/d, eating normally Pulm: No SOB Interactive and getting along well at home.  Otherwise, ROS is as per the HPI.  Objective:   BP 115/70 mmHg  Pulse 82  Temp(Src) 98.4 F (36.9 C) (Oral)  Ht 5' 3.5" (1.613 m)  Wt 213 lb 12 oz (96.956 kg)  BMI 37.27 kg/m2  GEN: WDWN, NAD, Non-toxic, A & O x 3 HEENT: Atraumatic, Normocephalic. Neck supple. No masses, No LAD. Ears and Nose: No external deformity. CV: RRR, No M/G/R. No JVD. No thrill. No extra heart sounds. PULM: CTA B, no wheezes, crackles, rhonchi. No retractions. No resp. distress. No accessory muscle use. EXTR: No c/c/e NEURO Normal gait.  PSYCH: Normally interactive. Conversant. Not depressed or anxious appearing.  Calm demeanor.   Laboratory and Imaging Data: [facility] Data 06/02/14 labs: negative ana, ssa and ssb, negative hepatitis profile, hiv, esr and crp wnl 11/2013: neg ana, neg ccp, neg rf, crp4.3, esr43, TSH5, T3 2.5, vit d 35 R lacrimal sac biopsy negative 08/2013: lyme neg  Results for orders placed or performed in visit on 11/21/14  Hemoglobin A1c  Result Value Ref Range   Hgb A1c MFr Bld 5.2 4.6 - 6.5 %  TSH  Result Value Ref Range   TSH 2.42 0.35 - 4.50 uIU/mL  T3, free  Result Value Ref Range   T3, Free 3.6 2.3 - 4.2 pg/mL  T4, free  Result Value Ref Range   Free T4 1.13 0.60 - 1.60 ng/dL     Assessment and Plan:   Hyperglycemia - Plan: Hemoglobin A1c  Other specified hypothyroidism - Plan: TSH, T3, free, T4, free  Primary fibromyalgia syndrome  Sicca  Migraine without aura and without status migrainosus, not intractable  Major depression in complete remission  Essential hypertension  Primary osteoarthritis involving multiple joints  >25 minutes spent in face to face time with patient, >50% spent in counselling or coordination of care  She and Mr. Hagg  wanted to come and talk to me about a number of issues.  She had been taking up Friday of different things that a natural pathic physician had been giving her.  She has been on metformin as well as the Friday of hormones in attempt to make her feel better.  They did not.  Lab Results  Component Value Date   HGBA1C 5.2 11/21/2014  She is not diabetic. No need for medication.  Diet control and exercise is certainly helpful always, decreases morbidity, and certainly would help with pain management and fibromyalgia.  She also wanted to talk to me about long-term disability.  She has multiple medical problems,  significant pain, fibromyalgia, back pain, recent lumbar surgery with neurological changes, very significant ongoing fatigue, chronic migraines, and intermittent, waxing and waning depression, which had been stable but has not been quite good recently.  This is a complex discussion to have in general.  I reviewed with them that if they wanted to pursue social security disability, that she really needed to have some legal counsel to help her navigate the system, and that her lawyer typically would help obtain a large-scale occupational medical evaluation done by a occupational medicine practitioner.  I do think that she would feel better and would have improvement of her medical conditions if she were in a less stressful environment, slept better, ate better, exercise more and generally were under less stress.   Orders Placed This Encounter  Procedures  . Hemoglobin A1c  . TSH  . T3, free  . T4, free    Signed,  Bow Buntyn T. Jeilani Grupe, MD   Patient's Medications  New Prescriptions   No medications on file  Previous Medications   BILBERRY, VACCINIUM MYRTILLUS, 1000 MG CAPS    Take 2 capsules by mouth at bedtime.   BUPROPION (WELLBUTRIN XL) 300 MG 24 HR TABLET    TAKE 1 TABLET BY MOUTH DAILY   CEVIMELINE (EVOXAC) 30 MG CAPSULE    Take 30 mg by mouth.   CHOLECALCIFEROL (VITAMIN D-3) 5000  UNITS TABS    Take 1 capsule by mouth daily.   CYCLOBENZAPRINE (FLEXERIL) 5 MG TABLET    TAKE 1 TABLET BY MOUTH 3 TIMES A DAY AS NEEDED FOR MUSCLE SPASMS.   DIAZEPAM (VALIUM) 5 MG TABLET       GLUCOSE BLOOD (ONE TOUCH ULTRA TEST) TEST STRIP    Use to check blood sugar once daily.   IBUPROFEN (ADVIL,MOTRIN) 800 MG TABLET    TAKE 1 TABLET AT ONSET OF HEADACHE   LEVOTHYROXINE (SYNTHROID, LEVOTHROID) 75 MCG TABLET    TAKE ONE (1) TABLET BY MOUTH EVERY DAY   LOSARTAN-HYDROCHLOROTHIAZIDE (HYZAAR) 50-12.5 MG PER TABLET    TAKE 1 TABLET BY MOUTH DAILY   MELOXICAM (MOBIC) 7.5 MG TABLET    TAKE 1 TABLET BY MOUTH DAILY   MULTIPLE VITAMIN (MULTIVITAMIN) CAPSULE    1 capsule.   NYSTATIN-TRIAMCINOLONE OINTMENT (MYCOLOG)       OMEPRAZOLE (PRILOSEC) 20 MG CAPSULE    TAKE ONE (1) CAPSULE BY MOUTH EACH DAY   OXYCODONE HCL 10 MG TABS       OXYCODONE-ACETAMINOPHEN (PERCOCET/ROXICET) 5-325 MG PER TABLET    Take 1 tablet by mouth every 4 (four) hours as needed for severe pain. May take 2 tablets PO q 6 hours for severe pain - Do not take with Tylenol as this tablet already contains tylenol  Modified Medications   No medications on file  Discontinued Medications   BARBERRY-OREG GRAPE-GOLDENSEAL (BERBERINE COMPLEX PO)    Take 1-2 capsules by mouth daily.   CIPROFLOXACIN (CIPRO) 250 MG TABLET    Take 1 tablet (250 mg total) by mouth 2 (two) times daily.   DOXYCYCLINE (VIBRAMYCIN) 100 MG CAPSULE    Take 100 mg by mouth 2 (two) times daily.   GABAPENTIN (NEURONTIN) 300 MG CAPSULE       HYDROCORTISONE (ANUSOL-HC) 2.5 % RECTAL CREAM    Place rectally 2 (two) times daily.   METFORMIN (GLUCOPHAGE-XR) 500 MG 24 HR TABLET       NALTREXONE HCL PO    Take by mouth at bedtime.   NON FORMULARY    Take 2  capsules by mouth daily. Adrenplus 370m   PROGESTERONE (PROMETRIUM) 200 MG CAPSULE    Take 200 mg by mouth daily.   TESTOSTERONE PROPIONATE (FIRST-TESTOSTERONE) 2 % OINT    Place onto the skin.

## 2014-11-22 ENCOUNTER — Encounter: Payer: Self-pay | Admitting: Family Medicine

## 2014-11-22 DIAGNOSIS — M3500 Sicca syndrome, unspecified: Secondary | ICD-10-CM | POA: Insufficient documentation

## 2014-11-25 ENCOUNTER — Telehealth: Payer: Self-pay | Admitting: *Deleted

## 2014-11-25 NOTE — Telephone Encounter (Signed)
Kristine Curtis notified that her A1c and thyroid test were normal.  Will follow up in October for her CPE as scheduled.

## 2014-11-25 NOTE — Telephone Encounter (Signed)
Pt left voicemail at Triage requesting results of last labs and also when does she need to schedule a f/u

## 2015-01-13 ENCOUNTER — Ambulatory Visit (INDEPENDENT_AMBULATORY_CARE_PROVIDER_SITE_OTHER): Payer: 59 | Admitting: Internal Medicine

## 2015-01-13 ENCOUNTER — Encounter: Payer: Self-pay | Admitting: Internal Medicine

## 2015-01-13 VITALS — BP 124/80 | HR 72 | Ht 63.0 in | Wt 211.0 lb

## 2015-01-13 DIAGNOSIS — K219 Gastro-esophageal reflux disease without esophagitis: Secondary | ICD-10-CM

## 2015-01-13 DIAGNOSIS — R1310 Dysphagia, unspecified: Secondary | ICD-10-CM | POA: Diagnosis not present

## 2015-01-13 DIAGNOSIS — K589 Irritable bowel syndrome without diarrhea: Secondary | ICD-10-CM

## 2015-01-13 DIAGNOSIS — Z1211 Encounter for screening for malignant neoplasm of colon: Secondary | ICD-10-CM | POA: Diagnosis not present

## 2015-01-13 MED ORDER — OMEPRAZOLE 40 MG PO CPDR: 40.0000 mg | DELAYED_RELEASE_CAPSULE | Freq: Every day | ORAL | Status: DC

## 2015-01-13 NOTE — Patient Instructions (Addendum)
Today you have been given a handout to read and follow on benefiber.   You have been scheduled for an endoscopy and colonoscopy. Please follow the written instructions given to you at your visit today. Please pick up your over the counter prep supplies at the pharmacy . If you use inhalers (even only as needed), please bring them with you on the day of your procedure. Your physician has requested that you go to www.startemmi.com and enter the access code given to you at your visit today. This web site gives a general overview about your procedure. However, you should still follow specific instructions given to you by our office regarding your preparation for the procedure.  We have sent the following medications to your pharmacy for you to pick up at your convenience: Omeprazole , the dose is increased.   I appreciate the opportunity to care for you. Stan Head, MD, [facility]

## 2015-01-13 NOTE — Progress Notes (Signed)
  Referred by [name] MD Subjective:    Patient ID: Kristine Curtis, female    DOB: 07-08-1960, 54 y.o.   MRN: [id number] Cc: needs colonoscopy, wants EGD also HPI Very nice middle aged lady s/p EGD and colonoscopy 2005 - had partial ring of esophagus 2005 but no dysphagia then.  Having some intermittent solid dysphagia and only rare heartburn on PPI. Bloating also. Has alternating bowel habits and slight red blooc w/ straining - has been chronic Probiotics no help but yogurt w/ Activea Cxs only thing that does help, occ generic MiraLAx use w/ relief GI ROS o/w neg  Medications, allergies, past medical history, past surgical history, family history and social history are reviewed and updated in the EMR.  Review of Systems Some LE sxs after back surgery but overall much better as no back pain Has joint pains - fibromyalgia, headaches, pedal edema, insomnia, fatigue and night sweats On temp disability and likely to pursue permanant All other ROS neg    Objective:   Physical Exam @BP  124/80 mmHg  Pulse 72  Ht 5\' 3"  (1.6 m)  Wt 211 lb (95.709 kg)  BMI 37.39 kg/m2@  General:  Well-developed, well-nourished and in no acute distress Eyes:  anicteric. ENT:   Mouth and posterior pharynx free of lesions.  Neck:   supple w/o thyromegaly or mass.  Lungs: Clear to auscultation bilaterally. Heart:  S1S2, no rubs, murmurs, gallops. Abdomen:  soft, non-tender, no hepatosplenomegaly, hernia, or mass and BS+.  Rectal: deferred Lymph:  no cervical or supraclavicular adenopathy. Extremities:   no edema, cyanosis or clubbing Skin   dime size scaly red plaque on right leg Neuro:  A&O x 3.  Psych:  appropriate mood and  Affect.   Data Reviewed: 2005 EGD/colonoscopy     Assessment & Plan:  Dysphagia  Gastroesophageal reflux disease, esophagitis presence not specified  Colon cancer screening  IBS (irritable bowel syndrome)   EGD w/ dilation Screening colonoscopy The risks  and benefits as well as alternatives of endoscopic procedure(s) have been discussed and reviewed. All questions answered. The patient agrees to proceed.  Try fiber - Benefiber Increase omeprazole to 40 mg qd  I appreciate the opportunity to care for this patient.  IH:WTUUEKC Copland, MD

## 2015-02-01 ENCOUNTER — Other Ambulatory Visit: Payer: 59

## 2015-02-02 ENCOUNTER — Other Ambulatory Visit (INDEPENDENT_AMBULATORY_CARE_PROVIDER_SITE_OTHER): Payer: 59

## 2015-02-02 DIAGNOSIS — E559 Vitamin D deficiency, unspecified: Secondary | ICD-10-CM | POA: Diagnosis not present

## 2015-02-02 DIAGNOSIS — Z0000 Encounter for general adult medical examination without abnormal findings: Secondary | ICD-10-CM

## 2015-02-02 DIAGNOSIS — I10 Essential (primary) hypertension: Secondary | ICD-10-CM | POA: Diagnosis not present

## 2015-02-02 LAB — LIPID PANEL
Cholesterol: 159 mg/dL (ref 0–200)
HDL: 54.9 mg/dL (ref >39.00)
LDL Cholesterol: 85 mg/dL (ref 0–99)
NonHDL: 104.06
Total CHOL/HDL Ratio: 3
Triglycerides: 93 mg/dL (ref 0.0–149.0)
VLDL: 18.6 mg/dL (ref 0.0–40.0)

## 2015-02-02 LAB — CBC WITH DIFFERENTIAL/PLATELET
Basophils Absolute: 0 10*3/uL (ref 0.0–0.1)
Basophils Relative: 0.4 % (ref 0.0–3.0)
Eosinophils Absolute: 0.1 10*3/uL (ref 0.0–0.7)
Eosinophils Relative: 0.7 % (ref 0.0–5.0)
HCT: 39.5 % (ref 36.0–46.0)
Hemoglobin: 13.3 g/dL (ref 12.0–15.0)
Lymphocytes Relative: 24.7 % (ref 12.0–46.0)
Lymphs Abs: 3.1 10*3/uL (ref 0.7–4.0)
MCHC: 33.6 g/dL (ref 30.0–36.0)
MCV: 91.7 fl (ref 78.0–100.0)
Monocytes Absolute: 0.5 10*3/uL (ref 0.1–1.0)
Monocytes Relative: 4.2 % (ref 3.0–12.0)
Neutro Abs: 8.8 10*3/uL — ABNORMAL HIGH (ref 1.4–7.7)
Neutrophils Relative %: 70 % (ref 43.0–77.0)
Platelets: 338 10*3/uL (ref 150.0–400.0)
RBC: 4.31 Mil/uL (ref 3.87–5.11)
RDW: 12 % (ref 11.5–15.5)
WBC: 12.6 10*3/uL — ABNORMAL HIGH (ref 4.0–10.5)

## 2015-02-02 LAB — COMPREHENSIVE METABOLIC PANEL
ALT: 18 U/L (ref 0–35)
AST: 11 U/L (ref 0–37)
Albumin: 4 g/dL (ref 3.5–5.2)
Alkaline Phosphatase: 86 U/L (ref 39–117)
BUN: 20 mg/dL (ref 6–23)
CO2: 30 mEq/L (ref 19–32)
Calcium: 9.4 mg/dL (ref 8.4–10.5)
Chloride: 105 mEq/L (ref 96–112)
Creatinine, Ser: 0.73 mg/dL (ref 0.40–1.20)
GFR: 87.99 mL/min (ref >60.00)
Glucose, Bld: 107 mg/dL — ABNORMAL HIGH (ref 70–99)
Potassium: 4.4 mEq/L (ref 3.5–5.1)
Sodium: 141 mEq/L (ref 135–145)
Total Bilirubin: 0.5 mg/dL (ref 0.2–1.2)
Total Protein: 6.6 g/dL (ref 6.0–8.3)

## 2015-02-02 LAB — VITAMIN D 25 HYDROXY (VIT D DEFICIENCY, FRACTURES): VITD: 51.65 ng/mL (ref 30.00–100.00)

## 2015-02-06 ENCOUNTER — Ambulatory Visit (INDEPENDENT_AMBULATORY_CARE_PROVIDER_SITE_OTHER): Payer: 59 | Admitting: Cardiology

## 2015-02-06 ENCOUNTER — Encounter: Payer: Self-pay | Admitting: Cardiology

## 2015-02-06 VITALS — BP 150/100 | HR 89 | Ht 63.0 in | Wt 212.2 lb

## 2015-02-06 DIAGNOSIS — I10 Essential (primary) hypertension: Secondary | ICD-10-CM

## 2015-02-06 DIAGNOSIS — I493 Ventricular premature depolarization: Secondary | ICD-10-CM | POA: Insufficient documentation

## 2015-02-06 NOTE — Patient Instructions (Signed)
We will seek your old medical records and call once we have them.

## 2015-02-06 NOTE — Progress Notes (Signed)
Cardiology Office Note   Date:  02/06/2015   ID:  Kristine Curtis, DOB Sep 21, 1960, MRN [id number]  PCP:  [name], MD  Cardiologist:   Oma Marzan [place], MD   Chief Complaint  Patient presents with  . New Evaluation    pt states chest pain in center of chest, felt pressure, lasted about 30 min./ happened last thursday  . Shortness of Breath    when she is sitting sometimes, she feels PVCs and feels like it takes her breath away  . Dizziness    if she moves too quickly, or bends down to pick up something  . Edema    bilateral legs/feet/ankles      History of Present Illness: Kristine Curtis is a 54 y.o. female who presents for evaluation of an irregular heart beat. She was evaluated in the past but apparently over 10 years ago. No records available in EMR. She reports that at that time she had chest pain and palpitations. Found to have PVCs. Had some type of imaging study and then subsequent cardiac cath. She reports this was normal. She was seen by Dr. Ladona Ridgel for EP evaluation. No change in symptoms with beta blocker and essentially reassured. She has done well since then but still experiences palpitations at times. This may feel like a "butterfly" in her chest but other times feels like a pounding with a heavy weight on her chest. She was having eye surgery at [facility] and preop cardiology evaluation noted an irregular heart beat. It was recommended she stop taking phentermine that she was takiing for weight loss at the time. She is seen now for follow up. She does note that she is more active now than she has been in years. No limitation with exertion. Reports BP has been under good control.     Past Medical History  Diagnosis Date  . Allergic rhinitis due to pollen   . Fibromyalgia   . Asthma   . Depression   . GERD (gastroesophageal reflux disease)   . Hypertension   . Hypothyroidism   . Osteoarthritis   . Restless leg syndrome   . Vertigo   . IBS (irritable bowel  syndrome)   . Family history of polycystic kidney 11/16/2012    neg CT  . Sicca ([facility]) 11/22/2014  . Internal hemorrhoids   . PVC (premature ventricular contraction)     Past Surgical History  Procedure Laterality Date  . Abdominal hysterectomy    . Cesarean section    . Tubal ligation  1992  . Cholecystectomy  10-2008  . Shoulder arthroscopy w/ subacromial decompression and distal clavicle excision Right     rotator cuff repair  . Eye surgery      2 2013 and 1981/DCR of right eye 05/2014  . Lumbar disc surgery  2016    L3-4  . Esophagogastroduodenoscopy    . Colonoscopy       Current Outpatient Prescriptions  Medication Sig Dispense Refill  . Bilberry, Vaccinium myrtillus, 1000 MG CAPS Take 2 capsules by mouth at bedtime.    Marland Kitchen buPROPion (WELLBUTRIN XL) 300 MG 24 hr tablet TAKE 1 TABLET BY MOUTH DAILY 30 tablet 4  . cevimeline (EVOXAC) 30 MG capsule Take 30 mg by mouth as needed.     . Cholecalciferol (VITAMIN D-3) 5000 UNITS TABS Take 1 capsule by mouth daily.    . cyclobenzaprine (FLEXERIL) 5 MG tablet TAKE 1 TABLET BY MOUTH 3 TIMES A DAY AS NEEDED FOR MUSCLE SPASMS.  30 tablet 5  . diazepam (VALIUM) 5 MG tablet 5 mg every 6 (six) hours as needed.     [name] Sodium (COLACE PO) Take 1 tablet by mouth as needed.    Marland Kitchen glucose blood (ONE TOUCH ULTRA TEST) test strip Use to check blood sugar once daily. 100 each 3  . HYDROcodone-acetaminophen (NORCO/VICODIN) 5-325 MG per tablet Take 1 tablet by mouth every 6 (six) hours as needed for moderate pain.    Marland Kitchen ibuprofen (ADVIL,MOTRIN) 800 MG tablet TAKE 1 TABLET AT ONSET OF HEADACHE 30 tablet 5  . levothyroxine (SYNTHROID, LEVOTHROID) 75 MCG tablet TAKE ONE (1) TABLET BY MOUTH EVERY DAY 30 tablet 11  . losartan-hydrochlorothiazide (HYZAAR) 50-12.5 MG per tablet TAKE 1 TABLET BY MOUTH DAILY 30 tablet 5  . meloxicam (MOBIC) 7.5 MG tablet TAKE 1 TABLET BY MOUTH DAILY 30 tablet 5  . Multiple Vitamin (MULTIVITAMIN) capsule 1 capsule.    Marland Kitchen  omeprazole (PRILOSEC) 40 MG capsule Take 1 capsule (40 mg total) by mouth daily before breakfast. 30 capsule 11  . Wheat Dextrin (BENEFIBER) POWD Take 2 scoop by mouth at bedtime.     No current facility-administered medications for this visit.    Allergies:   Gabapentin (once-daily); Codeine; and Sulfonamide derivatives    Social History:  The patient  reports that she quit smoking about 25 years ago. Her smoking use included Cigarettes. She has a 24 pack-year smoking history. She has never used smokeless tobacco. She reports that she does not drink alcohol or use illicit drugs.   Family History:  The patient's family history includes Diabetes in her father; Glaucoma in her father; Heart disease in her father; Kidney failure in her brother; Pancreatic cancer in her mother.    ROS:  Please see the history of present illness.   Otherwise, review of systems are positive for none.   All other systems are reviewed and negative.    PHYSICAL EXAM: VS:  BP 150/100 mmHg  Pulse 89  Ht  (1.6 m)  Wt 96.253 kg (212 lb 3.2 oz)  BMI 37.60 kg/m2 , BMI Body mass index is 37.6 kg/(m^2). GEN: Well nourished, obese,  in no acute distress HEENT: normal Neck: no JVD, carotid bruits, or masses Cardiac: RRR; no murmurs, rubs, or gallops,no edema  Respiratory:  clear to auscultation bilaterally, normal work of breathing GI: soft, nontender, nondistended, + BS MS: no deformity or atrophy Skin: warm and dry, no rash Neuro:  Strength and sensation are intact Psych: euthymic mood, full affect   EKG:  EKG is ordered today. The ekg ordered today demonstrates NSR with normal Ecg.   Recent Labs: 11/21/2014: TSH 2.42 02/02/2015: ALT 18; BUN 20; Creatinine, Ser 0.73; Hemoglobin 13.3; Platelets 338.0; Potassium 4.4; Sodium 141    Lipid Panel    Component Value Date/Time   CHOL 159 02/02/2015 0901   TRIG 93.0 02/02/2015 0901   HDL 54.90 02/02/2015 0901   CHOLHDL 3 02/02/2015 0901   VLDL 18.6  02/02/2015 0901   LDLCALC 85 02/02/2015 0901      Wt Readings from Last 3 Encounters:  02/06/15 96.253 kg (212 lb 3.2 oz)  01/13/15 95.709 kg (211 lb)  11/21/14 96.956 kg (213 lb 12 oz)      Other studies Reviewed: Additional studies/ records that were reviewed today include: records from [facility] Review of the above records demonstrates: cardiology consult note. No Ecg done at that time.   ASSESSMENT AND PLAN:  1.  Palpitations. Long history  of PVCs and I suspect this is what was noted on prior exam. Phentermine may exacerbate this and I agree she should avoid this. Unclear whether any additional evaluation needed at this time. She reports some heart condition in past but apparently had a normal cardiac cath. Will requests paper records from [facility] cardiology and old [facility] cardiology records (Dr. Ladona Ridgel). This will help determine if any other follow up needed.   Current medicines are reviewed at length with the patient today.  The patient does not have concerns regarding medicines.  The following changes have been made:  no change  Labs/ tests ordered today include:  Orders Placed This Encounter  Procedures  . EKG 12-Lead     Disposition:   TBD  Signed, Glover Capano [place], MD  02/06/2015 12:32 PM    [facility] [address], [place], [place], [address] Phone [phone], Fax [phone]

## 2015-02-08 ENCOUNTER — Ambulatory Visit (INDEPENDENT_AMBULATORY_CARE_PROVIDER_SITE_OTHER): Payer: 59 | Admitting: Family Medicine

## 2015-02-08 ENCOUNTER — Encounter: Payer: Self-pay | Admitting: Family Medicine

## 2015-02-08 VITALS — BP 110/78 | HR 98 | Temp 98.5°F | Ht 63.0 in | Wt 211.5 lb

## 2015-02-08 DIAGNOSIS — M545 Low back pain, unspecified: Secondary | ICD-10-CM

## 2015-02-08 DIAGNOSIS — R2689 Other abnormalities of gait and mobility: Secondary | ICD-10-CM

## 2015-02-08 DIAGNOSIS — M797 Fibromyalgia: Secondary | ICD-10-CM

## 2015-02-08 DIAGNOSIS — R29818 Other symptoms and signs involving the nervous system: Secondary | ICD-10-CM | POA: Diagnosis not present

## 2015-02-08 DIAGNOSIS — Z23 Encounter for immunization: Secondary | ICD-10-CM | POA: Diagnosis not present

## 2015-02-08 DIAGNOSIS — Z0000 Encounter for general adult medical examination without abnormal findings: Secondary | ICD-10-CM

## 2015-02-08 NOTE — Patient Instructions (Signed)

## 2015-02-08 NOTE — Progress Notes (Signed)
Pre visit review using our clinic review tool, if applicable. No additional management support is needed unless otherwise documented below in the visit note. 

## 2015-02-08 NOTE — Progress Notes (Signed)
Dr. Frederico Hamman T. Lukah Goswami, MD, [facility] [place], [address] Phone: [phone] Fax: [phone]  02/08/2015  Patient: Kristine Curtis, MRN: [id number], DOB: [birth year], 54 y.o.  Primary Physician:  Owens Loffler, MD  Chief Complaint: Annual Exam  Subjective:   Kristine Curtis is a 54 y.o. pleasant patient who presents with the following:  Health Maintenance Summary Reviewed and updated, unless pt declines services.  Tobacco History Reviewed. Non-smoker Alcohol: No concerns, no excessive use Exercise Habits: Some activity, rec at least 30 mins 5 times a week - limited mostly by pain STD concerns: none Drug Use: None Birth control method: hyst Lumps or breast concerns: no Breast Cancer Family History: no  Colon - upcoming  Graduated from PT yesterday.  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   Continue PT from other functional issues.  Balance issues Fibromyalgia -- physical therapy and hand.  Across the hall from Dr. Ellene Route.   Before was causing more PT when going through rehab.    Health Maintenance  Topic Date Due  . Hepatitis C Screening  01/04/1960  . HIV Screening  02/22/1975  . COLONOSCOPY  12/29/2013  . INFLUENZA VACCINE  12/05/2015  . MAMMOGRAM  02/26/2016  . TETANUS/TDAP  02/07/2025    Immunization History  Administered Date(s) Administered  . Influenza,inj,Quad PF,36+ Mos 02/08/2015  . Tdap 02/08/2015   Patient Active Problem List   Diagnosis Date Noted  . Sicca ([place]) 11/22/2014  . Acquired nasolacrimal duct obstruction 04/13/2014  . Family history of polycystic kidney 11/16/2012  . UNSPECIFIED VITAMIN D DEFICIENCY 10/09/2009  . FATIGUE 05/22/2009  . Other and unspecified hyperlipidemia 11/15/2008  . Major depression in complete remission ([place]) 08/31/2008  . Essential hypertension 08/31/2008  . ALLERGIC RHINITIS 08/31/2008  . ASTHMA 08/31/2008  . GERD 08/31/2008  .  Osteoarthritis 08/31/2008  . Hypothyroidism 08/30/2008  . Migraine 08/30/2008  . Primary fibromyalgia syndrome 08/30/2008   Past Medical History  Diagnosis Date  . Allergic rhinitis due to pollen   . Fibromyalgia   . Asthma   . Depression   . GERD (gastroesophageal reflux disease)   . Hypertension   . Hypothyroidism   . Osteoarthritis   . Restless leg syndrome   . Vertigo   . IBS (irritable bowel syndrome)   . Family history of polycystic kidney 11/16/2012    neg CT  . Sicca ([place]) 11/22/2014  . Internal hemorrhoids   . PVC (premature ventricular contraction)    Past Surgical History  Procedure Laterality Date  . Abdominal hysterectomy    . Cesarean section    . Tubal ligation  1992  . Cholecystectomy  10-2008  . Shoulder arthroscopy w/ subacromial decompression and distal clavicle excision Right     rotator cuff repair  . Eye surgery      2 2013 and 1981/DCR of right eye 05/2014  . Lumbar disc surgery  2016    L3-4  . Esophagogastroduodenoscopy    . Colonoscopy     Social History   Social History  . Marital Status: Married    Spouse Name: N/A  . Number of Children: 2  . Years of Education: N/A   Occupational History  . quality assurance tech    Social History Main Topics  . Smoking status: Former Smoker -- 2.00 packs/day for 12 years    Types: Cigarettes    Quit date: 05/06/1989  . Smokeless tobacco: Never Used     Comment:  quit smoking 1990  . Alcohol Use: No  . Drug Use: No  . Sexual Activity: Not on file   Other Topics Concern  . Not on file   Social History Narrative   Former Dr. Redmond Pulling then Dr. Juventino Slovak patient    Dr. Rosemary Holms, podiatrist   Ortho--[facility]   Cards-- [facility] [place]   Uro--Dr. Era Bumpers   Family History  Problem Relation Age of Onset  . Pancreatic cancer Mother   . Diabetes Father   . Heart disease Father   . Glaucoma Father   . Kidney failure Brother    Allergies  Allergen Reactions  . Gabapentin  (Once-Daily)     "side effect to severe"  . Codeine Itching  . Sulfonamide Derivatives Itching    Medication list has been reviewed and updated.   General: Denies fever, chills, sweats. No significant weight loss. Eyes: Denies blurring,significant itching ENT: Denies earache, sore throat, and hoarseness.  Cardiovascular: Denies chest pains, palpitations, dyspnea on exertion,  Respiratory: Denies cough, dyspnea at rest,wheeezing Breast: no concerns about lumps GI: Denies nausea, vomiting, diarrhea, constipation, change in bowel habits, abdominal pain, melena, hematochezia GU: Denies dysuria, hematuria, urinary hesitancy, nocturia, denies STD risk, no concerns about discharge Musculoskeletal: + neck, back pain Derm: Denies rash, itching Neuro: ongoing radiculopathy and numbness Psych: Denies depression, anxiety Endocrine: Denies cold intolerance, heat intolerance, polydipsia Heme: Denies enlarged lymph nodes Allergy: No hayfever  Objective:   BP 110/78 mmHg  Pulse 98  Temp(Src) 98.5 F (36.9 C) (Oral)  Ht _0  (1.6 m)  Wt 211 lb 8 oz (95.936 kg)  BMI 37.48 kg/m2 No exam data present  GEN: well developed, well nourished, no acute distress Eyes: conjunctiva and lids normal, PERRLA, EOMI ENT: TM clear, nares clear, oral exam WNL Neck: supple, no lymphadenopathy, no thyromegaly, no JVD Pulm: clear to auscultation and percussion, respiratory effort normal CV: regular rate and rhythm, S1-S2, no murmur, rub or gallop, no bruits Chest: no scars, masses, no lumps BREAST: breast exam declined GI: soft, non-tender; no hepatosplenomegaly, masses; active bowel sounds all quadrants GU: GU exam declined Lymph: no cervical, axillary or inguinal adenopathy MSK: gait normal, muscle tone and strength WNL, no joint swelling, effusions, discoloration, crepitus. TTP soft tissue from neck to L-spine SKIN: clear, good turgor, color WNL, no rashes, lesions, or ulcerations Neuro: normal mental  status, normal strength. Abnormal and decreased balance with walking.  Psych: alert; oriented to person, place and time, normally interactive and not anxious or depressed in appearance.   All labs reviewed with patient. Lipids:    Component Value Date/Time   CHOL 159 02/02/2015 0901   TRIG 93.0 02/02/2015 0901   HDL 54.90 02/02/2015 0901   VLDL 18.6 02/02/2015 0901   CHOLHDL 3 02/02/2015 0901   CBC: CBC Latest Ref Rng 02/02/2015 11/04/2013 02/06/2012  WBC 4.0 - 10.5 K/uL 12.6(H) 6.9 8.2  Hemoglobin 12.0 - 15.0 g/dL 13.3 13.4 11.8(L)  Hematocrit 36.0 - 46.0 % 39.5 39.5 35.2(L)  Platelets 150.0 - 400.0 K/uL 338.0 258.0 409.8    Basic Metabolic Panel:    Component Value Date/Time   NA 141 02/02/2015 0901   K 4.4 02/02/2015 0901   CL 105 02/02/2015 0901   CO2 30 02/02/2015 0901   BUN 20 02/02/2015 0901   CREATININE 0.73 02/02/2015 0901   GLUCOSE 107* 02/02/2015 0901   CALCIUM 9.4 02/02/2015 0901   Hepatic Function Latest Ref Rng 02/02/2015 11/04/2013 02/06/2012  Total Protein 6.0 - 8.3 g/dL  6.6 6.8 6.4  Albumin 3.5 - 5.2 g/dL 4.0 3.7 3.4(L)  AST 0 - 37 U/L _0 ALT 0 - 35 U/L _1 Alk Phosphatase 39 - 117 U/L 86 103 76  Total Bilirubin 0.2 - 1.2 mg/dL 0.5 0.9 0.5  Bilirubin, Direct 0.0 - 0.3 mg/dL - 0.1 0.1    Lab Results  Component Value Date   TSH 2.42 11/21/2014   No results found.  Assessment and Plan:   Healthcare maintenance  Fibromyalgia - Plan: Ambulatory referral to Physical Therapy  Balance problem - Plan: Ambulatory referral to Physical Therapy  Low back pain at multiple sites - Plan: Ambulatory referral to Physical Therapy  Need for prophylactic vaccination and inoculation against influenza - Plan: Flu Vaccine QUAD 36+ mos IM  Need for prophylactic vaccination with combined diphtheria-tetanus-pertussis (DTP) vaccine - Plan: Tdap vaccine greater than or equal to 7yo IM  Health Maintenance Exam: The patient's preventative maintenance and  recommended screening tests for an annual wellness exam were reviewed in full today. Brought up to date unless services declined.  Counselled on the importance of diet, exercise, and its role in overall health and mortality. The patient's FH and SH was reviewed, including their home life, tobacco status, and drug and alcohol status.  Follow-up: No Follow-up on file. Or follow-up in 1 year for complete physical examination  New Prescriptions   No medications on file   Orders Placed This Encounter  Procedures  . Flu Vaccine QUAD 36+ mos IM  . Tdap vaccine greater than or equal to 7yo IM  . Ambulatory referral to Physical Therapy    Signed,  Frederico Hamman T. Shanyla Marconi, MD   Patient's Medications  New Prescriptions   No medications on file  Previous Medications   BILBERRY, VACCINIUM MYRTILLUS, 1000 MG CAPS    Take 2 capsules by mouth at bedtime.   BUPROPION (WELLBUTRIN XL) 300 MG 24 HR TABLET    TAKE 1 TABLET BY MOUTH DAILY   CEVIMELINE (EVOXAC) 30 MG CAPSULE    Take 30 mg by mouth as needed.    CHOLECALCIFEROL (VITAMIN D-3) 5000 UNITS TABS    Take 1 capsule by mouth daily.   CYCLOBENZAPRINE (FLEXERIL) 5 MG TABLET    TAKE 1 TABLET BY MOUTH 3 TIMES A DAY AS NEEDED FOR MUSCLE SPASMS.   DIAZEPAM (VALIUM) 5 MG TABLET    5 mg every 6 (six) hours as needed.    DOCUSATE SODIUM (COLACE PO)    Take 1 tablet by mouth as needed.   GLUCOSE BLOOD (ONE TOUCH ULTRA TEST) TEST STRIP    Use to check blood sugar once daily.   HYDROCODONE-ACETAMINOPHEN (NORCO/VICODIN) 5-325 MG PER TABLET    Take 1 tablet by mouth every 6 (six) hours as needed for moderate pain.   IBUPROFEN (ADVIL,MOTRIN) 800 MG TABLET    TAKE 1 TABLET AT ONSET OF HEADACHE   LEVOTHYROXINE (SYNTHROID, LEVOTHROID) 75 MCG TABLET    TAKE ONE (1) TABLET BY MOUTH EVERY DAY   LOSARTAN-HYDROCHLOROTHIAZIDE (HYZAAR) 50-12.5 MG PER TABLET    TAKE 1 TABLET BY MOUTH DAILY   MELOXICAM (MOBIC) 7.5 MG TABLET    TAKE 1 TABLET BY MOUTH DAILY   MULTIPLE  VITAMINS-MINERALS (ALIVE WOMENS GUMMY) CHEW    Chew 2 each by mouth daily.   OMEPRAZOLE (PRILOSEC) 40 MG CAPSULE    Take 1 capsule (40 mg total) by mouth daily before breakfast.   WHEAT DEXTRIN (BENEFIBER) POWD    Take 2  scoop by mouth at bedtime.  Modified Medications   No medications on file  Discontinued Medications   MULTIPLE VITAMIN (MULTIVITAMIN) CAPSULE    1 capsule.

## 2015-02-09 ENCOUNTER — Encounter: Payer: Self-pay | Admitting: Family Medicine

## 2015-02-14 ENCOUNTER — Encounter: Payer: Self-pay | Admitting: Internal Medicine

## 2015-02-14 ENCOUNTER — Ambulatory Visit (AMBULATORY_SURGERY_CENTER): Payer: 59 | Admitting: Internal Medicine

## 2015-02-14 ENCOUNTER — Telehealth: Payer: Self-pay

## 2015-02-14 VITALS — BP 106/74 | HR 85 | Temp 97.8°F | Resp 30 | Ht 63.0 in | Wt 211.0 lb

## 2015-02-14 DIAGNOSIS — R1310 Dysphagia, unspecified: Secondary | ICD-10-CM | POA: Diagnosis not present

## 2015-02-14 DIAGNOSIS — I493 Ventricular premature depolarization: Secondary | ICD-10-CM

## 2015-02-14 DIAGNOSIS — K222 Esophageal obstruction: Secondary | ICD-10-CM

## 2015-02-14 DIAGNOSIS — Z1211 Encounter for screening for malignant neoplasm of colon: Secondary | ICD-10-CM

## 2015-02-14 DIAGNOSIS — K219 Gastro-esophageal reflux disease without esophagitis: Secondary | ICD-10-CM | POA: Diagnosis not present

## 2015-02-14 HISTORY — PX: COLONOSCOPY: SHX174

## 2015-02-14 MED ORDER — SODIUM CHLORIDE 0.9 % IV SOLN: 500.0000 mL | INTRAVENOUS | Status: DC

## 2015-02-14 NOTE — Op Note (Signed)
[facility] [address] [place], [address]   COLONOSCOPY PROCEDURE REPORT  PATIENT: Kristine Curtis, Kristine Curtis  MR#: [id number] BIRTHDATE: June 27, 1960 , 53  yrs. old GENDER: female ENDOSCOPIST: Iva Boop, MD, [facility] PROCEDURE DATE:  02/14/2015 PROCEDURE:   Colonoscopy, screening First Screening Colonoscopy - Avg.  risk and is 50 yrs.  old or older Yes.  Prior Negative Screening - Now for repeat screening. N/A  History of Adenoma - Now for follow-up colonoscopy & has been > or = to 3 yrs.  N/A  Polyps removed today? No Recommend repeat exam, <10 yrs? No ASA CLASS:   Class II INDICATIONS:Screening for colonic neoplasia and Colorectal Neoplasm Risk Assessment for this procedure is average risk. MEDICATIONS: Propofol 350 mg IV, Monitored anesthesia care, and Residual sedation present  DESCRIPTION OF PROCEDURE:   After the risks benefits and alternatives of the procedure were thoroughly explained, informed consent was obtained.  The digital rectal exam revealed no abnormalities of the rectum.   The LB [id number]  endoscope was introduced through the anus and advanced to the cecum, which was identified by both the appendix and ileocecal valve. No adverse events experienced.   The quality of the prep was adequate (MiraLax was used)  The instrument was then slowly withdrawn as the colon was fully examined. Estimated blood loss is zero unless otherwise noted in this procedure report.      COLON FINDINGS: A normal appearing cecum, ileocecal valve, and appendiceal orifice were identified.  The ascending, transverse, descending, sigmoid colon, and rectum appeared unremarkable. Retroflexed views revealed no abnormalities. The time to cecum = 3.8 Withdrawal time = 17.7   The scope was withdrawn and the procedure completed. COMPLICATIONS: There were no immediate complications.  ENDOSCOPIC IMPRESSION: Normal colonoscopy  RECOMMENDATIONS: Repeat colonoscopy 10  years.  eSigned:  Iva Boop, MD, [facility] 02/14/2015 3:40 PM   cc: The Patient and [name], MD

## 2015-02-14 NOTE — Patient Instructions (Addendum)
I dilated the esophagus and took biopsies to evaluate some changes in the lining. Will call or send My Chart message with results.  The colonoscopy was normal. Next routine colonoscopy in 10 years - 2026  I appreciate the opportunity to care for you. Iva Boop, MD, FACG  YOU HAD AN ENDOSCOPIC PROCEDURE TODAY AT THE [facility]:   Refer to the procedure report that was given to you for any specific questions about what was found during the examination.  If the procedure report does not answer your questions, please call your gastroenterologist to clarify.  If you requested that your care partner not be given the details of your procedure findings, then the procedure report has been included in a sealed envelope for you to review at your convenience later.  YOU SHOULD EXPECT: Some feelings of bloating in the abdomen. Passage of more gas than usual.  Walking can help get rid of the air that was put into your GI tract during the procedure and reduce the bloating. If you had a lower endoscopy (such as a colonoscopy or flexible sigmoidoscopy) you may notice spotting of blood in your stool or on the toilet paper. If you underwent a bowel prep for your procedure, you may not have a normal bowel movement for a few days.  Please Note:  You might notice some irritation and congestion in your nose or some drainage.  This is from the oxygen used during your procedure.  There is no need for concern and it should clear up in a day or so.  SYMPTOMS TO REPORT IMMEDIATELY:   Following lower endoscopy (colonoscopy or flexible sigmoidoscopy):  Excessive amounts of blood in the stool  Significant tenderness or worsening of abdominal pains  Swelling of the abdomen that is new, acute  Fever of 100F or higher   Following upper endoscopy (EGD)  Vomiting of blood or coffee ground material  New chest pain or pain under the shoulder blades  Painful or persistently difficult  swallowing  New shortness of breath  Fever of 100F or higher  Black, tarry-looking stools  For urgent or emergent issues, a gastroenterologist can be reached at any hour by calling [phone].   DIET: FOLLOW DILATION DIET- SEE HANDOUT  ACTIVITY:  You should plan to take it easy for the rest of today and you should NOT DRIVE or use heavy machinery until tomorrow (because of the sedation medicines used during the test).    FOLLOW UP: Our staff will call the number listed on your records the next business day following your procedure to check on you and address any questions or concerns that you may have regarding the information given to you following your procedure. If we do not reach you, we will leave a message.  However, if you are feeling well and you are not experiencing any problems, there is no need to return our call.  We will assume that you have returned to your regular daily activities without incident.  If any biopsies were taken you will be contacted by phone or by letter within the next 1-3 weeks.  Please call us at [phone] if you have not heard about the biopsies in 3 weeks.   SIGNATURES/CONFIDENTIALITY: You and/or your care partner have signed paperwork which will be entered into your electronic medical record.  These signatures attest to the fact that that the information above on your After Visit Summary has been reviewed and is understood.  Full responsibility of  the confidentiality of this discharge information lies with you and/or your care-partner.  CONTINUE YOUR NORMAL MEDICATIONS

## 2015-02-14 NOTE — Op Note (Signed)
[facility] [address] [place], [address]   ENDOSCOPY PROCEDURE REPORT  PATIENT: Kristine Curtis, Kristine Curtis  MR#: [id number] BIRTHDATE: Sep 04, 1960 , 53  yrs. old GENDER: female ENDOSCOPIST: Iva Boop, MD, [facility] PROCEDURE DATE:  02/14/2015 PROCEDURE:  EGD w/ biopsy and EGD w/ balloon dilation ASA CLASS:     Class II INDICATIONS:  dysphagia and therapeutic procedure. MEDICATIONS: Propofol 250 mg IV and Monitored anesthesia care TOPICAL ANESTHETIC: none  DESCRIPTION OF PROCEDURE: After the risks benefits and alternatives of the procedure were thoroughly explained, informed consent was obtained.  The LB [id number] endoscope was introduced through the mouth and advanced to the second portion of the duodenum , Without limitations.  The instrument was slowly withdrawn as the mucosa was fully examined.    1) Ring at the GE junction dilated 18,19,20 mm w/ balloon 2) Small patch columnar mucosa in distal esophagus - biopsied - ? Barrett's 3) Otherwise normal.  Retroflexed views revealed no abnormalities. The scope was then withdrawn from the patient and the procedure completed.  COMPLICATIONS: There were no immediate complications.  ENDOSCOPIC IMPRESSION: 1) Ring at the GE junction dilated 18,19,20 mm w/ balloon 2) Small patch columnar mucosa in distal esophagus - biopsied - ? Barrett's 3) Otherwise normal  RECOMMENDATIONS: 1.  Clear liquids until 430, then soft foods rest of day.  Resume prior diet tomorrow. 2.  Proceed with a Colonoscopy. 3.  Await pathology results     will notify by phone vs. My Chart    eSigned:  Iva Boop, MD, [facility] 02/14/2015 3:38 PM    CC: The Patient

## 2015-02-14 NOTE — Progress Notes (Signed)
Transferred to recovery room. A/O x3, pleased with MAC.  VSS.  Report to Kristen, RN. 

## 2015-02-14 NOTE — Telephone Encounter (Signed)
Spoke to patient Dr.Jordan received old Rogers paper chart.He advised needs to repeat echo.Schedulers will call back to schedule.

## 2015-02-14 NOTE — Progress Notes (Signed)
Called to room to assist during endoscopic procedure.  Patient ID and intended procedure confirmed with present staff. Received instructions for my participation in the procedure from the performing physician.  

## 2015-02-15 ENCOUNTER — Telehealth: Payer: Self-pay

## 2015-02-15 NOTE — Telephone Encounter (Signed)
  Follow up Call-  Call back number 02/14/2015  Post procedure Call Back phone  # [phone]  Permission to leave phone message Yes     Patient questions:  Do you have a fever, pain , or abdominal swelling? No. Pain Score  0 *  Have you tolerated food without any problems? Yes.    Have you been able to return to your normal activities? Yes.    Do you have any questions about your discharge instructions: Diet   No. Medications  No. Follow up visit  No.  Do you have questions or concerns about your Care? No.  Actions: * If pain score is 4 or above: No action needed, pain <4.

## 2015-02-20 ENCOUNTER — Encounter: Payer: Self-pay | Admitting: Cardiology

## 2015-02-22 ENCOUNTER — Encounter: Payer: Self-pay | Admitting: Cardiology

## 2015-02-22 NOTE — Progress Notes (Signed)
Quick Note:  No Barrett's esophagus No recall or letter Will notify by My Chart ______

## 2015-02-24 ENCOUNTER — Ambulatory Visit (HOSPITAL_COMMUNITY): Payer: 59 | Attending: Cardiovascular Disease

## 2015-02-24 ENCOUNTER — Encounter (HOSPITAL_COMMUNITY): Payer: Self-pay

## 2015-02-24 ENCOUNTER — Other Ambulatory Visit: Payer: Self-pay

## 2015-02-24 DIAGNOSIS — I493 Ventricular premature depolarization: Secondary | ICD-10-CM | POA: Diagnosis not present

## 2015-02-24 DIAGNOSIS — Z87891 Personal history of nicotine dependence: Secondary | ICD-10-CM | POA: Insufficient documentation

## 2015-02-24 DIAGNOSIS — I10 Essential (primary) hypertension: Secondary | ICD-10-CM | POA: Diagnosis not present

## 2015-02-24 DIAGNOSIS — E785 Hyperlipidemia, unspecified: Secondary | ICD-10-CM | POA: Insufficient documentation

## 2015-02-24 DIAGNOSIS — I517 Cardiomegaly: Secondary | ICD-10-CM | POA: Diagnosis not present

## 2015-02-24 DIAGNOSIS — I340 Nonrheumatic mitral (valve) insufficiency: Secondary | ICD-10-CM | POA: Diagnosis not present

## 2015-02-24 NOTE — Progress Notes (Signed)
FYI:  Patient presented for echocardiogram today for PVC. LV function was down. Patient was asymptomatic at the time of the exam. She did note that she has had shortness of breath and chest pain radiates to both left and right shoulders when at home without activity. Patient was discharged due to lack of symptoms at present time.  Dewitt Hoes, [place] 02/24/2015

## 2015-02-27 ENCOUNTER — Other Ambulatory Visit: Payer: Self-pay

## 2015-02-27 DIAGNOSIS — I519 Heart disease, unspecified: Secondary | ICD-10-CM

## 2015-02-27 DIAGNOSIS — I493 Ventricular premature depolarization: Secondary | ICD-10-CM

## 2015-02-27 DIAGNOSIS — I10 Essential (primary) hypertension: Secondary | ICD-10-CM

## 2015-02-27 MED ORDER — CARVEDILOL 3.125 MG PO TABS: 3.1250 mg | ORAL_TABLET | Freq: Two times a day (BID) | ORAL | Status: DC

## 2015-02-27 MED ORDER — LISINOPRIL 2.5 MG PO TABS: 2.5000 mg | ORAL_TABLET | Freq: Every day | ORAL | Status: DC

## 2015-02-28 ENCOUNTER — Ambulatory Visit (INDEPENDENT_AMBULATORY_CARE_PROVIDER_SITE_OTHER): Payer: 59

## 2015-02-28 DIAGNOSIS — I493 Ventricular premature depolarization: Secondary | ICD-10-CM

## 2015-03-01 ENCOUNTER — Encounter: Payer: Self-pay | Admitting: Cardiology

## 2015-03-02 ENCOUNTER — Encounter: Payer: Self-pay | Admitting: Family Medicine

## 2015-03-02 ENCOUNTER — Telehealth: Payer: Self-pay | Admitting: Internal Medicine

## 2015-03-02 NOTE — Telephone Encounter (Signed)
Patient notified of the results and that Dr. Leone Payor sent her a message on mychart

## 2015-03-14 LAB — BASIC METABOLIC PANEL
BUN: 13 mg/dL (ref 7–25)
CO2: 26 mmol/L (ref 20–31)
Calcium: 9.4 mg/dL (ref 8.6–10.4)
Chloride: 103 mmol/L (ref 98–110)
Creat: 0.75 mg/dL (ref 0.50–1.05)
Glucose, Bld: 98 mg/dL (ref 65–99)
Potassium: 4.4 mmol/L (ref 3.5–5.3)
Sodium: 139 mmol/L (ref 135–146)

## 2015-03-17 ENCOUNTER — Ambulatory Visit (HOSPITAL_COMMUNITY)
Admission: RE | Admit: 2015-03-17 | Discharge: 2015-03-17 | Disposition: A | Discharge status: Home / Self Care | Payer: 59 | Source: Ambulatory Visit | Attending: Cardiology | Admitting: Cardiology

## 2015-03-17 DIAGNOSIS — I519 Heart disease, unspecified: Secondary | ICD-10-CM

## 2015-03-17 DIAGNOSIS — I429 Cardiomyopathy, unspecified: Secondary | ICD-10-CM | POA: Insufficient documentation

## 2015-03-17 DIAGNOSIS — I493 Ventricular premature depolarization: Secondary | ICD-10-CM

## 2015-03-17 LAB — CREATININE, SERUM
Creatinine, Ser: 0.96 mg/dL (ref 0.44–1.00)
GFR calc Af Amer: >60 mL/min (ref >60)
GFR calc non Af Amer: >60 mL/min (ref >60)

## 2015-03-17 MED ORDER — GADOBENATE DIMEGLUMINE 529 MG/ML IV SOLN
40.0000 mL | Freq: Once | INTRAVENOUS | Status: AC | PRN
  Administered 2015-03-17: 38 mL via INTRAVENOUS

## 2015-03-22 ENCOUNTER — Ambulatory Visit (INDEPENDENT_AMBULATORY_CARE_PROVIDER_SITE_OTHER): Payer: 59 | Admitting: Physician Assistant

## 2015-03-22 ENCOUNTER — Encounter: Payer: Self-pay | Admitting: Physician Assistant

## 2015-03-22 VITALS — BP 110/70 | HR 99 | Ht 63.0 in | Wt 217.4 lb

## 2015-03-22 DIAGNOSIS — I10 Essential (primary) hypertension: Secondary | ICD-10-CM | POA: Diagnosis not present

## 2015-03-22 DIAGNOSIS — I429 Cardiomyopathy, unspecified: Secondary | ICD-10-CM

## 2015-03-22 DIAGNOSIS — E669 Obesity, unspecified: Secondary | ICD-10-CM | POA: Diagnosis not present

## 2015-03-22 DIAGNOSIS — I5022 Chronic systolic (congestive) heart failure: Secondary | ICD-10-CM | POA: Insufficient documentation

## 2015-03-22 DIAGNOSIS — I428 Other cardiomyopathies: Secondary | ICD-10-CM

## 2015-03-22 MED ORDER — LISINOPRIL 5 MG PO TABS: 5.0000 mg | ORAL_TABLET | Freq: Every day | ORAL | Status: DC

## 2015-03-22 MED ORDER — CARVEDILOL 6.25 MG PO TABS: 6.2500 mg | ORAL_TABLET | Freq: Two times a day (BID) | ORAL | Status: DC

## 2015-03-22 MED ORDER — HYDROCHLOROTHIAZIDE 12.5 MG PO CAPS: 12.5000 mg | ORAL_CAPSULE | Freq: Every day | ORAL | Status: DC

## 2015-03-22 NOTE — Patient Instructions (Signed)
Your physician recommends that you schedule a follow-up appointment in: 3 Months with Dr [place]  Your physician has recommended you make the following change in your medication: STOP Losartan/HCTz and START HCTZ 12.5 mg daily, INCREASE Lisinopril 5 mg daily and INCREASE Carvedilol 6.25 mg twice a day  If you need a refill on your cardiac medications before your next appointment, please call your pharmacy.

## 2015-03-22 NOTE — Progress Notes (Signed)
Patient ID: TASHEBA HENSON, female   DOB: 1960-12-12, 54 y.o.   MRN: [id number]    Date:  03/22/2015   ID:  Bland Span, DOB Oct 01, 1960, MRN [id number]  PCP:  [name], MD  Primary Cardiologist:  [place]  Chief complaint: Follow-up card myopathy   History of Present Illness: Kristine Curtis is a 54 y.o. female who presented for evaluation of an irregular heart beat Oct 3.  24 hour Holter monitor revealed rare PVCs and PACs.  . Had some type of imaging study and then subsequent cardiac cath. She reports this was normal. She was seen by Dr. Ladona Ridgel for EP evaluation. No change in symptoms with beta blocker and essentially reassured. She has done well since then but still experiences palpitations at times. This may feel like a "butterfly" in her chest but other times feels like a pounding with a heavy weight on her chest. She was having eye surgery at [facility] and preop cardiology evaluation noted an irregular heart beat. It was recommended she stop taking phentermine that she was takiing for weight loss at the time.  During her previous visit she noted that she is more active now than she has been in years. No limitation with exertion. Reports BP has been under good control.   She had a 2D echo which revealed an EF of 30-35%, LV mildly dilated, diffuse hypokinesis, mild MR, LA mildly dilated.  She was started on Lisinopril and Coreg.  Patient is here now for follow-up and medication titration.  She reports feeling fatigued, dizzy, nausea at times, chest pain which comes and goes. She can't sleep and has a headache frequently his chronic back problems and fibromyalgia for which she's been working with PT physical therapy, however, the insurance approval ran out for this. She also has diffuse abdominal pain. Most of these issues are not new and been ongoing for several months.  Her A1c in July was 5.2.  TSH free T4 and T3 were all within normal limits.  CMP lipid panel and CBC at the end of  September all looked good with the exception of a mildly elevated WBC count.  She denies any viral type illness in the last 6-12 months. She's also been seeing a Naturopathic doctor who said she had chronic Epstein-Barr virus.  She's tried working with Systems analyst in the past, however, she says her muscles hurt so bad she had to put ice packs on them  The patient currently denies  vomiting, fever, shortness of breath, orthopnea, cough, congestion, hematochezia, melena, lower extremity edema, claudication.  Cardiac MRI IMPRESSION: 1) Mild LVE with diffuse hypokinesis EF 44% 2) No scar tissue or infarct on delayed gadolinium images 3) Mild LAE 4) Normal RV 5) Normal Valves 6) No pericardial effusion   Echocardiogra, Study Conclusions  - Left ventricle: The cavity size was mildly dilated. Systolic function was moderately to severely reduced. The estimated ejection fraction was in the range of 30% to 35%. Moderate diffuse hypokinesis with no identifiable regional variations. There was fusion of early and atrial contributions to ventricular filling. The study is not technically sufficient to allow evaluation of LV diastolic function. No evidence of thrombus. - Mitral valve: There was mild regurgitation. - Left atrium: The atrium was mildly dilated.   Wt Readings from Last 3 Encounters:  03/22/15 217 lb 6.4 oz (98.612 kg)  02/14/15 211 lb (95.709 kg)  02/08/15 211 lb 8 oz (95.936 kg)     Past Medical History  Diagnosis Date  .  Allergic rhinitis due to pollen   . Fibromyalgia   . Asthma   . Depression   . GERD (gastroesophageal reflux disease)   . Hypertension   . Hypothyroidism   . Osteoarthritis   . Restless leg syndrome   . Vertigo   . IBS (irritable bowel syndrome)   . Family history of polycystic kidney 11/16/2012    neg CT  . Sicca (HCC) 11/22/2014  . Internal hemorrhoids   . PVC (premature ventricular contraction)     Current Outpatient  Prescriptions  Medication Sig Dispense Refill  . Bilberry, Vaccinium myrtillus, 1000 MG CAPS Take 2 capsules by mouth at bedtime.    Marland Kitchen buPROPion (WELLBUTRIN XL) 300 MG 24 hr tablet TAKE 1 TABLET BY MOUTH DAILY 30 tablet 4  . carvedilol (COREG) 3.125 MG tablet Take 1 tablet (3.125 mg total) by mouth 2 (two) times daily. 60 tablet 6  . cevimeline (EVOXAC) 30 MG capsule Take 30 mg by mouth as needed.     . Cholecalciferol (VITAMIN D-3) 5000 UNITS TABS Take 1 capsule by mouth daily.    . cyclobenzaprine (FLEXERIL) 5 MG tablet TAKE 1 TABLET BY MOUTH 3 TIMES A DAY AS NEEDED FOR MUSCLE SPASMS. 30 tablet 5  . diazepam (VALIUM) 5 MG tablet 5 mg every 6 (six) hours as needed.     [name] Sodium (COLACE PO) Take 1 tablet by mouth as needed (CONSTIPATION).     Marland Kitchen glucose blood (ONE TOUCH ULTRA TEST) test strip Use to check blood sugar once daily. 100 each 3  . HYDROcodone-acetaminophen (NORCO/VICODIN) 5-325 MG per tablet Take 1 tablet by mouth every 6 (six) hours as needed for moderate pain.    Marland Kitchen ibuprofen (ADVIL,MOTRIN) 800 MG tablet Take one (1) tablet (800 mg total) by mouth as needed at onset of headache. May repeat in two (2) hours if needed.    Marland Kitchen levothyroxine (SYNTHROID, LEVOTHROID) 75 MCG tablet TAKE ONE (1) TABLET BY MOUTH EVERY DAY 30 tablet 11  . lisinopril (PRINIVIL,ZESTRIL) 2.5 MG tablet Take 1 tablet (2.5 mg total) by mouth daily. 30 tablet 6  . meloxicam (MOBIC) 7.5 MG tablet TAKE 1 TABLET BY MOUTH DAILY 30 tablet 5  . Multiple Vitamins-Minerals (ALIVE WOMENS GUMMY) CHEW Chew 2 each by mouth daily.    Marland Kitchen omeprazole (PRILOSEC) 40 MG capsule Take 1 capsule (40 mg total) by mouth daily before breakfast. 30 capsule 11  . testosterone (ANDROGEL) 50 MG/5GM (1%) GEL Place 5 g onto the skin daily.    Marland Kitchen VAGIFEM 10 MCG TABS vaginal tablet Take 10 mcg by mouth daily.    . Wheat Dextrin (BENEFIBER) POWD Take 2 scoop by mouth at bedtime.     No current facility-administered medications for this visit.      Allergies:    Allergies  Allergen Reactions  . Gabapentin (Once-Daily)     "side effect to severe"  . Codeine Itching  . Sulfonamide Derivatives Itching    Social History:  The patient  reports that she quit smoking about 25 years ago. Her smoking use included Cigarettes. She has a 24 pack-year smoking history. She has never used smokeless tobacco. She reports that she does not drink alcohol or use illicit drugs.   Family history:   Family History  Problem Relation Age of Onset  . Pancreatic cancer Mother   . Diabetes Father   . Heart disease Father   . Glaucoma Father   . Kidney failure Brother     ROS:  Please  see the history of present illness.  All other systems reviewed and negative.   PHYSICAL EXAM: VS:  BP 110/70 mmHg  Pulse 99  Ht 5\' 3"  (1.6 m)  Wt 217 lb 6.4 oz (98.612 kg)  BMI 38.52 kg/m2  SpO2 96% Obese, well developed, in no acute distress HEENT: Pupils are equal round react to light accommodation extraocular movements are intact.  Neck: no JVDNo cervical lymphadenopathy. Cardiac: Regular rate and rhythm without murmurs rubs or gallops. Lungs:  clear to auscultation bilaterally, no wheezing, rhonchi or rales Abd: soft, mild diffuse tenderness, positive bowel sounds all quadrants Ext: no lower extremity edema.  2+ radial and dorsalis pedis pulses. Skin: warm and dry Neuro:  Grossly normal       ASSESSMENT AND PLAN:  Problem List Items Addressed This Visit    Obesity (BMI 30-39.9) - Primary   Nonischemic cardiomyopathy (HCC)   Essential hypertension (Chronic)     Nonischemic cardiomyopathy, unclear etiology Recent 2-D echocardiogram revealed an ejection fraction 30-35% with diffuse hypokinesis.  She subsequently had a cardiac MRI which revealed no infarct or scar with a calculated EF of 44%.  Patient was then started on Coreg 3.125 mg and lisinopril 2.5 mg. She is also taking Hyzaar. I discontinued this and represcribed HCTZ by itself at 12.5 mg  daily. Also increased her Coreg to 6.25 and lisinopril to 5. I think having fewer medications titrated would be better. Despite the patient reporting PND I think she is euvolemic. We discussed daily weight monitoring and when to call the office. We also talked about low sodium diet.  Palpitations :  24 hour Holter monitor revealed rare PVCs and PACs.    Essential hypertension: Blood pressure well-controlled. See above for medication changes. She will monitor blood pressure once daily for the next couple weeks and make sure it stays between 110/60-130/80.  Obesity BMI 38 We discussed increasing her exercise routine to 40 minutes cardio daily. We also discussed low carb diet.  Follow-up in 3 months with Dr. Sw[place]

## 2015-04-20 ENCOUNTER — Encounter: Payer: Self-pay | Admitting: Primary Care

## 2015-04-20 ENCOUNTER — Ambulatory Visit (INDEPENDENT_AMBULATORY_CARE_PROVIDER_SITE_OTHER): Payer: 59 | Admitting: Primary Care

## 2015-04-20 VITALS — BP 126/76 | HR 84 | Temp 98.2°F | Ht 63.0 in | Wt 216.1 lb

## 2015-04-20 DIAGNOSIS — J209 Acute bronchitis, unspecified: Secondary | ICD-10-CM | POA: Diagnosis not present

## 2015-04-20 MED ORDER — AMOXICILLIN-POT CLAVULANATE 875-125 MG PO TABS: 1.0000 | ORAL_TABLET | Freq: Two times a day (BID) | ORAL | Status: DC

## 2015-04-20 MED ORDER — BENZONATATE 200 MG PO CAPS: 200.0000 mg | ORAL_CAPSULE | Freq: Three times a day (TID) | ORAL | Status: DC | PRN

## 2015-04-20 NOTE — Patient Instructions (Signed)
Start Augmentin antibiotics. Take 1 tablet by mouth twice daily for 10 days.  You may take Benzonatate capsules for cough. Take 1 capsule by mouth three times daily as needed for cough.  Increase consumption of fluids and rest.   Please call me if no improvement in symptoms in 3-4 days.  It was a pleasure meeting you!  Acute Bronchitis Bronchitis is inflammation of the airways that extend from the windpipe into the lungs (bronchi). The inflammation often causes mucus to develop. This leads to a cough, which is the most common symptom of bronchitis.  In acute bronchitis, the condition usually develops suddenly and goes away over time, usually in a couple weeks. Smoking, allergies, and asthma can make bronchitis worse. Repeated episodes of bronchitis may cause further lung problems.  CAUSES Acute bronchitis is most often caused by the same virus that causes a cold. The virus can spread from person to person (contagious) through coughing, sneezing, and touching contaminated objects. SIGNS AND SYMPTOMS   Cough.   Fever.   Coughing up mucus.   Body aches.   Chest congestion.   Chills.   Shortness of breath.   Sore throat.  DIAGNOSIS  Acute bronchitis is usually diagnosed through a physical exam. Your health care provider will also ask you questions about your medical history. Tests, such as chest X-rays, are sometimes done to rule out other conditions.  TREATMENT  Acute bronchitis usually goes away in a couple weeks. Oftentimes, no medical treatment is necessary. Medicines are sometimes given for relief of fever or cough. Antibiotic medicines are usually not needed but may be prescribed in certain situations. In some cases, an inhaler may be recommended to help reduce shortness of breath and control the cough. A cool mist vaporizer may also be used to help thin bronchial secretions and make it easier to clear the chest.  HOME CARE INSTRUCTIONS  Get plenty of rest.   Drink  enough fluids to keep your urine clear or pale yellow (unless you have a medical condition that requires fluid restriction). Increasing fluids may help thin your respiratory secretions (sputum) and reduce chest congestion, and it will prevent dehydration.   Take medicines only as directed by your health care provider.  If you were prescribed an antibiotic medicine, finish it all even if you start to feel better.  Avoid smoking and secondhand smoke. Exposure to cigarette smoke or irritating chemicals will make bronchitis worse. If you are a smoker, consider using nicotine gum or skin patches to help control withdrawal symptoms. Quitting smoking will help your lungs heal faster.   Reduce the chances of another bout of acute bronchitis by washing your hands frequently, avoiding people with cold symptoms, and trying not to touch your hands to your mouth, nose, or eyes.   Keep all follow-up visits as directed by your health care provider.  SEEK MEDICAL CARE IF: Your symptoms do not improve after 1 week of treatment.  SEEK IMMEDIATE MEDICAL CARE IF:  You develop an increased fever or chills.   You have chest pain.   You have severe shortness of breath.  You have bloody sputum.   You develop dehydration.  You faint or repeatedly feel like you are going to pass out.  You develop repeated vomiting.  You develop a severe headache. MAKE SURE YOU:   Understand these instructions.  Will watch your condition.  Will get help right away if you are not doing well or get worse.   This information is not intended  to replace advice given to you by your health care provider. Make sure you discuss any questions you have with your health care provider.   Document Released: 05/30/2004 Document Revised: 05/13/2014 Document Reviewed: 10/13/2012 Elsevier Interactive Patient Education [employer].

## 2015-04-20 NOTE — Progress Notes (Signed)
Subjective:    Patient ID: Kristine Curtis, female    DOB: 12-19-1960, 54 y.o.   MRN: [id number]  HPI  Kristine Curtis is a 54 year old female who presents today with a chief complaint of cough. She also reports nasal congestion, chest congestion, fatigue, and swelling to her glands. Her symptoms have been present intermittently since Thanksgiving. She's been taking Robitussin DM, cough drops, Mucinex with temporary improvement. Her cough is non productive and is worse at night. Overall she's not noticed any improvement in her symptoms.   Review of Systems  Constitutional: Positive for chills. Negative for fever.  HENT: Positive for congestion, sinus pressure and sore throat. Negative for ear pain.   Respiratory: Positive for cough. Negative for shortness of breath.   Cardiovascular: Negative for chest pain.  Gastrointestinal: Negative for nausea.       Past Medical History  Diagnosis Date  . Allergic rhinitis due to pollen   . Fibromyalgia   . Asthma   . Depression   . GERD (gastroesophageal reflux disease)   . Hypertension   . Hypothyroidism   . Osteoarthritis   . Restless leg syndrome   . Vertigo   . IBS (irritable bowel syndrome)   . Family history of polycystic kidney 11/16/2012    neg CT  . Sicca (HCC) 11/22/2014  . Internal hemorrhoids   . PVC (premature ventricular contraction)     Social History   Social History  . Marital Status: Married    Spouse Name: N/A  . Number of Children: 2  . Years of Education: N/A   Occupational History  . quality assurance tech    Social History Main Topics  . Smoking status: Former Smoker -- 2.00 packs/day for 12 years    Types: Cigarettes    Quit date: 05/06/1989  . Smokeless tobacco: Never Used     Comment: quit smoking 1990  . Alcohol Use: No  . Drug Use: No  . Sexual Activity: Not on file   Other Topics Concern  . Not on file   Social History Narrative   Former Dr. Andrey Campanile then Dr. 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 Abdominal hysterectomy    . Cesarean section    . Tubal ligation  1992  . Cholecystectomy  10-2008  . Shoulder arthroscopy w/ subacromial decompression and distal clavicle excision Right     rotator cuff repair  . Eye surgery      2 2013 and 1981/DCR of right eye 05/2014  . Lumbar disc surgery  2016    L3-4  . Esophagogastroduodenoscopy    . Colonoscopy      Family History  Problem Relation Age of Onset  . Pancreatic cancer Mother   . Diabetes Father   . Heart disease Father   . Glaucoma Father   . Kidney failure Brother     Allergies  Allergen Reactions  . Gabapentin (Once-Daily)     "side effect to severe"  . Codeine Itching  . Sulfonamide Derivatives Itching    Current Outpatient Prescriptions on File Prior to Visit  Medication Sig Dispense Refill  . Bilberry, Vaccinium myrtillus, 1000 MG CAPS Take 2 capsules by mouth at bedtime.    Marland Kitchen buPROPion (WELLBUTRIN XL) 300 MG 24 hr tablet TAKE 1 TABLET BY MOUTH DAILY 30 tablet 4  . carvedilol (COREG) 6.25 MG tablet Take 1 tablet (6.25 mg total)  by mouth 2 (two) times daily. 60 tablet 9  . Cholecalciferol (VITAMIN D-3) 5000 UNITS TABS Take 1 capsule by mouth daily.    . cyclobenzaprine (FLEXERIL) 5 MG tablet TAKE 1 TABLET BY MOUTH 3 TIMES A DAY AS NEEDED FOR MUSCLE SPASMS. 30 tablet 5  . diazepam (VALIUM) 5 MG tablet 5 mg every 6 (six) hours as needed.     [name] Sodium (COLACE PO) Take 1 tablet by mouth as needed (CONSTIPATION).     Marland Kitchen glucose blood (ONE TOUCH ULTRA TEST) test strip Use to check blood sugar once daily. 100 each 3  . hydrochlorothiazide (MICROZIDE) 12.5 MG capsule Take 1 capsule (12.5 mg total) by mouth daily. 30 capsule 9  . HYDROcodone-acetaminophen (NORCO/VICODIN) 5-325 MG per tablet Take 1 tablet by mouth every 6 (six) hours as needed for moderate pain.    Marland Kitchen ibuprofen (ADVIL,MOTRIN) 800 MG tablet Take one (1) tablet (800 mg  total) by mouth as needed at onset of headache. May repeat in two (2) hours if needed.    Marland Kitchen levothyroxine (SYNTHROID, LEVOTHROID) 75 MCG tablet TAKE ONE (1) TABLET BY MOUTH EVERY DAY 30 tablet 11  . lisinopril (PRINIVIL,ZESTRIL) 5 MG tablet Take 1 tablet (5 mg total) by mouth daily. 30 tablet 9  . meloxicam (MOBIC) 7.5 MG tablet TAKE 1 TABLET BY MOUTH DAILY 30 tablet 5  . Multiple Vitamins-Minerals (ALIVE WOMENS GUMMY) CHEW Chew 2 each by mouth daily.    Marland Kitchen omeprazole (PRILOSEC) 40 MG capsule Take 1 capsule (40 mg total) by mouth daily before breakfast. 30 capsule 11  . testosterone (ANDROGEL) 50 MG/5GM (1%) GEL Place 5 g onto the skin daily.    Marland Kitchen VAGIFEM 10 MCG TABS vaginal tablet Take 10 mcg by mouth daily.    . Wheat Dextrin (BENEFIBER) POWD Take 2 scoop by mouth at bedtime.     No current facility-administered medications on file prior to visit.    BP 126/76 mmHg  Pulse 84  Temp(Src) 98.2 F (36.8 C) (Oral)  Ht 5\' 3"  (1.6 m)  Wt 216 lb 1.9 oz (98.031 kg)  BMI 38.29 kg/m2  SpO2 97%    Objective:   Physical Exam  Constitutional: She appears well-nourished. She appears ill.  Dry cough during exam.  HENT:  Right Ear: Tympanic membrane and ear canal normal.  Left Ear: Tympanic membrane and ear canal normal.  Nose: Right sinus exhibits maxillary sinus tenderness. Right sinus exhibits no frontal sinus tenderness. Left sinus exhibits maxillary sinus tenderness. Left sinus exhibits no frontal sinus tenderness.  Mouth/Throat: Oropharynx is clear and moist.  Eyes: Pupils are equal, round, and reactive to light.  Neck: Neck supple.  Cardiovascular: Normal rate and regular rhythm.   Pulmonary/Chest: Effort normal and breath sounds normal. She has no wheezes. She has no rales.  Lymphadenopathy:    She has no cervical adenopathy.  Skin: Skin is warm and dry.          Assessment & Plan:  Acute Bronchitis:  Cough x 3 weeks with fatigue, nasal congestion, sinus pressure. Temporary  relief with OTC's and has gone through boxes. Exam with clear lungs, dry/hacky cough during exam, appears ill. Due to duration of symptoms and presentation will treat. RX for Augmentin course and tessalon pearls sent to pharmacy. Fluids, rest, return precautions provided.

## 2015-04-20 NOTE — Progress Notes (Signed)
Pre visit review using our clinic review tool, if applicable. No additional management support is needed unless otherwise documented below in the visit note. 

## 2015-04-28 ENCOUNTER — Other Ambulatory Visit: Payer: Self-pay | Admitting: Family Medicine

## 2015-04-28 ENCOUNTER — Other Ambulatory Visit: Payer: Self-pay

## 2015-04-28 MED ORDER — OMEPRAZOLE 40 MG PO CPDR: 40.0000 mg | DELAYED_RELEASE_CAPSULE | Freq: Every day | ORAL | Status: DC

## 2015-04-28 NOTE — Telephone Encounter (Signed)
Refill sent in as pharmacy requested.

## 2015-05-02 ENCOUNTER — Encounter: Payer: Self-pay | Admitting: Family Medicine

## 2015-05-02 ENCOUNTER — Ambulatory Visit: Payer: 59 | Admitting: Family Medicine

## 2015-05-02 ENCOUNTER — Ambulatory Visit (INDEPENDENT_AMBULATORY_CARE_PROVIDER_SITE_OTHER): Payer: 59 | Admitting: Family Medicine

## 2015-05-02 VITALS — BP 110/80 | HR 102 | Temp 98.4°F | Ht 63.0 in | Wt 215.8 lb

## 2015-05-02 DIAGNOSIS — M5441 Lumbago with sciatica, right side: Secondary | ICD-10-CM | POA: Diagnosis not present

## 2015-05-02 DIAGNOSIS — M5440 Lumbago with sciatica, unspecified side: Secondary | ICD-10-CM

## 2015-05-02 DIAGNOSIS — I5022 Chronic systolic (congestive) heart failure: Secondary | ICD-10-CM | POA: Diagnosis not present

## 2015-05-02 DIAGNOSIS — G43111 Migraine with aura, intractable, with status migrainosus: Secondary | ICD-10-CM

## 2015-05-02 DIAGNOSIS — G8929 Other chronic pain: Secondary | ICD-10-CM | POA: Diagnosis not present

## 2015-05-02 MED ORDER — DIVALPROEX SODIUM ER 500 MG PO TB24: 500.0000 mg | ORAL_TABLET | Freq: Every day | ORAL | Status: DC

## 2015-05-02 MED ORDER — HYDROCODONE-ACETAMINOPHEN 5-325 MG PO TABS: 1.0000 | ORAL_TABLET | Freq: Four times a day (QID) | ORAL | Status: DC | PRN

## 2015-05-02 NOTE — Progress Notes (Signed)
Dr. Karleen Hampshire T. Parveen Freehling, MD, CAQ Sports Medicine [facility] Sports [facility] [address] [place], [address] Phone: [phone] Fax: [phone]  05/02/2015  Patient: Kristine Curtis, MRN: [id number], DOB: 1960/08/05, 54 y.o.  Primary Physician:  [name], MD   Chief Complaint  Patient presents with  . Medication Management  . Headache   Subjective:   Kristine Curtis is a 54 y.o. very pleasant female patient who presents with the following:  Headaches - having some aura. Previously was all on the left. Nausea, snsitivity to light. A form of aura with it.  Now with daily headaches. Used to have L sided focal headaches, but these are more generalized. Cards does not want her taking NSAIDS. Using tylenol right now. With some aura and photophobia.   Heart failure - management by cardiology.  ? Foot fracture on the right. It is hurting at least.   Lab Results  Component Value Date   TSH 2.42 11/21/2014    Was using some ibuprofen. Taking some tylenol - does not help all that much.   ESI also. Had neurosurgery, too. L3-4 She has been on Valium, Oxycodone, Percocet, and Vicodin - reviewed all bottles with her.   NSG does not want to manage her pain chronically and has asked that we assume this role.   Past Medical History, Surgical History, Social History, Family History, Problem List, Medications, and Allergies have been reviewed and updated if relevant.  Patient Active Problem List   Diagnosis Date Noted  . Nonischemic cardiomyopathy (HCC) 03/22/2015  . Obesity (BMI 30-39.9) 03/22/2015  . Sicca (HCC) 11/22/2014  . Acquired nasolacrimal duct obstruction 04/13/2014  . Family history of polycystic kidney 11/16/2012  . UNSPECIFIED VITAMIN D DEFICIENCY 10/09/2009  . FATIGUE 05/22/2009  . Other and unspecified hyperlipidemia 11/15/2008  . Major depression in complete remission (HCC) 08/31/2008  . Essential hypertension 08/31/2008  . ALLERGIC RHINITIS  08/31/2008  . ASTHMA 08/31/2008  . GERD 08/31/2008  . Osteoarthritis 08/31/2008  . Hypothyroidism 08/30/2008  . Migraine 08/30/2008  . Primary fibromyalgia syndrome 08/30/2008    Past Medical History  Diagnosis Date  . Allergic rhinitis due to pollen   . Fibromyalgia   . Asthma   . Depression   . GERD (gastroesophageal reflux disease)   . Hypertension   . Hypothyroidism   . Osteoarthritis   . Restless leg syndrome   . Vertigo   . IBS (irritable bowel syndrome)   . Family history of polycystic kidney 11/16/2012    neg CT  . Sicca (HCC) 11/22/2014  . Internal hemorrhoids   . PVC (premature ventricular contraction)     Past Surgical History  Procedure Laterality Date  . Abdominal hysterectomy    . Cesarean section    . Tubal ligation  1992  . Cholecystectomy  10-2008  . Shoulder arthroscopy w/ subacromial decompression and distal clavicle excision Right     rotator cuff repair  . Eye surgery      2 2013 and 1981/DCR of right eye 05/2014  . Lumbar disc surgery  2016    L3-4  . Esophagogastroduodenoscopy    . Colonoscopy      Social History   Social History  . Marital Status: Married    Spouse Name: N/A  . Number of Children: 2  . Years of Education: N/A   Occupational History  . quality assurance tech    Social History Main Topics  . Smoking status: Former Smoker -- 2.00 packs/day  for 12 years    Types: Cigarettes    Quit date: 05/06/1989  . Smokeless tobacco: Never Used     Comment: quit smoking 1990  . Alcohol Use: No  . Drug Use: No  . Sexual Activity: Not on file   Other Topics Concern  . Not on file   Social History Narrative   Former Dr. Andrey Campanile then Dr. 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 Pancreatic cancer Mother   . Diabetes Father   . Heart disease Father   . Glaucoma Father   . Kidney failure Brother     Allergies  Allergen Reactions  . Gabapentin (Once-Daily)      "side effect to severe"  . Codeine Itching  . Sulfonamide Derivatives Itching    Medication list reviewed and updated in full in [facility].   GEN: No acute illnesses, no fevers, chills. GI: No n/v/d, eating normally Pulm: No SOB Interactive and getting along well at home.  Otherwise, ROS is as per the HPI.  Objective:   Filed Vitals:   05/02/15 0809  BP: 110/80  Pulse: 102  Temp: 98.4 F (36.9 C)  TempSrc: Oral  Height:  (1.6 m)  Weight: 215 lb 12 oz (97.864 kg)   GEN: WDWN, NAD, Non-toxic, A & O x 3 HEENT: Atraumatic, Normocephalic. Neck supple. No masses, No LAD. Ears and Nose: No external deformity. CV: RRR, No M/G/R. No JVD. No thrill. No extra heart sounds. PULM: CTA B, no wheezes, crackles, rhonchi. No retractions. No resp. distress. No accessory muscle use. EXTR: No c/c/e NEURO Normal gait.  PSYCH: Normally interactive. Conversant. Not depressed or anxious appearing.  Calm demeanor.   Laboratory and Imaging Data:  Assessment and Plan:   Headache, classical migraine, intractable, with status migrainosus  Chronic right-sided low back pain with right-sided sciatica  Chronic systolic congestive heart failure (HCC)  On Coreg. No B-blockers or TCA in this case. Start Depakote with close f/u for migraine.   Reviewed pain contract with her. Simplify regiment with her to Norco only - d/c oxycodone based meds. Cont tylenol.   Follow-up: Return in about 1 month (around 06/02/2015).  New Prescriptions   DIVALPROEX (DEPAKOTE ER) 500 MG 24 HR TABLET    Take 1 tablet (500 mg total) by mouth daily.   Modified Medications   Modified Medication Previous Medication   HYDROCODONE-ACETAMINOPHEN (NORCO/VICODIN) 5-325 MG TABLET HYDROcodone-acetaminophen (NORCO/VICODIN) 5-325 MG per tablet      Take 1 tablet by mouth every 6 (six) hours as needed for moderate pain.    Take 1 tablet by mouth every 6 (six) hours as needed for moderate pain.   No orders of the  defined types were placed in this encounter.    Signed,  Elpidio Galea. Garcia Dalzell, MD   Patient's Medications  New Prescriptions   DIVALPROEX (DEPAKOTE ER) 500 MG 24 HR TABLET    Take 1 tablet (500 mg total) by mouth daily.  Previous Medications   BILBERRY, VACCINIUM MYRTILLUS, 1000 MG CAPS    Take 2 capsules by mouth at bedtime.   BUPROPION (WELLBUTRIN XL) 300 MG 24 HR TABLET    TAKE 1 TABLET BY MOUTH DAILY   CARVEDILOL (COREG) 6.25 MG TABLET    Take 1 tablet (6.25 mg total) by mouth 2 (two) times daily.   CHOLECALCIFEROL (VITAMIN D-3) 5000 UNITS TABS    Take 1 capsule  by mouth daily.   CYCLOBENZAPRINE (FLEXERIL) 5 MG TABLET    TAKE 1 TABLET BY MOUTH 3 TIMES A DAY AS NEEDED FOR MUSCLE SPASMS.   DIAZEPAM (VALIUM) 5 MG TABLET    5 mg every 6 (six) hours as needed.    DOCUSATE SODIUM (COLACE PO)    Take 1 tablet by mouth as needed (CONSTIPATION).    GLUCOSE BLOOD (ONE TOUCH ULTRA TEST) TEST STRIP    Use to check blood sugar once daily.   HYDROCHLOROTHIAZIDE (MICROZIDE) 12.5 MG CAPSULE    Take 1 capsule (12.5 mg total) by mouth daily.   LEVOTHYROXINE (SYNTHROID, LEVOTHROID) 75 MCG TABLET    TAKE 1 TABLET BY MOUTH DAILY   LISINOPRIL (PRINIVIL,ZESTRIL) 5 MG TABLET    Take 1 tablet (5 mg total) by mouth daily.   MELOXICAM (MOBIC) 7.5 MG TABLET    TAKE 1 TABLET BY MOUTH DAILY   MULTIPLE VITAMINS-MINERALS (ALIVE WOMENS GUMMY) CHEW    Chew 2 each by mouth daily.   OMEPRAZOLE (PRILOSEC) 40 MG CAPSULE    Take 1 capsule (40 mg total) by mouth daily before breakfast.   RESTASIS 0.05 % OPHTHALMIC EMULSION       VAGIFEM 10 MCG TABS VAGINAL TABLET    Take 10 mcg by mouth daily.   WHEAT DEXTRIN (BENEFIBER) POWD    Take 2 scoop by mouth at bedtime.  Modified Medications   Modified Medication Previous Medication   HYDROCODONE-ACETAMINOPHEN (NORCO/VICODIN) 5-325 MG TABLET HYDROcodone-acetaminophen (NORCO/VICODIN) 5-325 MG per tablet      Take 1 tablet by mouth every 6 (six) hours as needed for moderate pain.     Take 1 tablet by mouth every 6 (six) hours as needed for moderate pain.  Discontinued Medications   AMOXICILLIN-CLAVULANATE (AUGMENTIN) 875-125 MG TABLET    Take 1 tablet by mouth 2 (two) times daily.   BENZONATATE (TESSALON) 200 MG CAPSULE    Take 1 capsule (200 mg total) by mouth 3 (three) times daily as needed.   IBUPROFEN (ADVIL,MOTRIN) 800 MG TABLET    Take one (1) tablet (800 mg total) by mouth as needed at onset of headache. May repeat in two (2) hours if needed.   OXYCODONE HCL 10 MG TABS       TESTOSTERONE (ANDROGEL) 50 MG/5GM (1%) GEL    Place 5 g onto the skin daily.

## 2015-05-02 NOTE — Progress Notes (Signed)
Pre visit review using our clinic review tool, if applicable. No additional management support is needed unless otherwise documented below in the visit note. 

## 2015-05-22 ENCOUNTER — Encounter: Payer: Self-pay | Admitting: Family Medicine

## 2015-05-22 MED ORDER — DIVALPROEX SODIUM ER 500 MG PO TB24: 1000.0000 mg | ORAL_TABLET | Freq: Every day | ORAL | Status: DC

## 2015-06-05 ENCOUNTER — Ambulatory Visit (INDEPENDENT_AMBULATORY_CARE_PROVIDER_SITE_OTHER): Payer: 59 | Admitting: Family Medicine

## 2015-06-05 ENCOUNTER — Encounter: Payer: Self-pay | Admitting: Family Medicine

## 2015-06-05 VITALS — BP 104/70 | HR 99 | Temp 98.4°F | Ht 63.0 in | Wt 224.5 lb

## 2015-06-05 DIAGNOSIS — G43109 Migraine with aura, not intractable, without status migrainosus: Secondary | ICD-10-CM | POA: Diagnosis not present

## 2015-06-05 NOTE — Progress Notes (Signed)
Dr. Karleen Hampshire T. Elfreda Blanchet, MD, CAQ Sports Medicine Primary [facility] and Sports Medicine [address] [place], [address] Phone: [phone] Fax: [phone]  06/05/2015  Patient: Kristine Curtis, MRN: [id number], DOB: Feb 12, 1961, 55 y.o.  Primary Physician:  [name], MD   Chief Complaint  Patient presents with  . Follow-up    1 Month   Subjective:   Kristine Curtis is a 55 y.o. very pleasant female patient who presents with the following:  F/u migraines. Increased Depakote to 1000 mg a day.   Now doing even better. Migraines are dramatically improved, but she has had a 9 pound weight gain.  She feels very good, and she feels like her mood is also more stable than has been in a long time.  Wt Readings from Last 3 Encounters:  06/05/15 224 lb 8 oz (101.833 kg)  05/02/15 215 lb 12 oz (97.864 kg)  04/20/15 216 lb 1.9 oz (98.031 kg)      05/02/2015 Last OV with [name], MD   Headaches - having some aura. Previously was all on the left. Nausea, snsitivity to light. A form of aura with it.  Now with daily headaches. Used to have L sided focal headaches, but these are more generalized. Cards does not want her taking NSAIDS. Using tylenol right now. With some aura and photophobia.   Heart failure - management by cardiology.  ? Foot fracture on the right. It is hurting at least.   Lab Results  Component Value Date   TSH 2.42 11/21/2014    Was using some ibuprofen. Taking some tylenol - does not help all that much.   ESI also. Had neurosurgery, too. L3-4 She has been on Valium, Oxycodone, Percocet, and Vicodin - reviewed all bottles with her.   NSG does not want to manage her pain chronically and has asked that we assume this role.   Past Medical History, Surgical History, Social History, Family History, Problem List, Medications, and Allergies have been reviewed and updated if relevant.  Patient Active Problem List   Diagnosis Date Noted  . Chronic  low back pain with sciatica 05/02/2015  . Chronic systolic congestive heart failure (HCC) 03/22/2015  . Obesity (BMI 30-39.9) 03/22/2015  . Sicca (HCC) 11/22/2014  . Acquired nasolacrimal duct obstruction 04/13/2014  . Family history of polycystic kidney 11/16/2012  . UNSPECIFIED VITAMIN D DEFICIENCY 10/09/2009  . FATIGUE 05/22/2009  . Other and unspecified hyperlipidemia 11/15/2008  . Major depression in complete remission (HCC) 08/31/2008  . Essential hypertension 08/31/2008  . ALLERGIC RHINITIS 08/31/2008  . ASTHMA 08/31/2008  . GERD 08/31/2008  . Osteoarthritis 08/31/2008  . Hypothyroidism 08/30/2008  . Headache, classical migraine, intractable, with status migrainosus 08/30/2008  . Primary fibromyalgia syndrome 08/30/2008    Past Medical History  Diagnosis Date  . Allergic rhinitis due to pollen   . Fibromyalgia   . Asthma   . Depression   . GERD (gastroesophageal reflux disease)   . Hypertension   . Hypothyroidism   . Osteoarthritis   . Restless leg syndrome   . Vertigo   . IBS (irritable bowel syndrome)   . Family history of polycystic kidney 11/16/2012    neg CT  . Sicca (HCC) 11/22/2014  . Internal hemorrhoids   . PVC (premature ventricular contraction)   . Chronic systolic congestive heart failure (HCC) 03/22/2015    Past Surgical History  Procedure Laterality Date  . Abdominal hysterectomy    . Cesarean section    .  Tubal ligation  1992  . Cholecystectomy  10-2008  . Shoulder arthroscopy w/ subacromial decompression and distal clavicle excision Right     rotator cuff repair  . Eye surgery      2 2013 and 1981/DCR of right eye 05/2014  . Lumbar disc surgery  2016    L3-4  . Esophagogastroduodenoscopy    . Colonoscopy      Social History   Social History  . Marital Status: Married    Spouse Name: N/A  . Number of Children: 2  . Years of Education: N/A   Occupational History  . quality assurance tech    Social History Main Topics  . Smoking  status: Former Smoker -- 2.00 packs/day for 12 years    Types: Cigarettes    Quit date: 05/06/1989  . Smokeless tobacco: Never Used     Comment: quit smoking 1990  . Alcohol Use: No  . Drug Use: No  . Sexual Activity: Not on file   Other Topics Concern  . Not on file   Social History Narrative   Former Dr. Andrey Campanile then Dr. 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 Pancreatic cancer Mother   . Diabetes Father   . Heart disease Father   . Glaucoma Father   . Kidney failure Brother     Allergies  Allergen Reactions  . Gabapentin (Once-Daily)     "side effect to severe"  . Codeine Itching  . Sulfonamide Derivatives Itching    Medication list reviewed and updated in full in [facility].   GEN: No acute illnesses, no fevers, chills. GI: No n/v/d, eating normally Pulm: No SOB Interactive and getting along well at home.  Otherwise, ROS is as per the HPI.  Objective:   Filed Vitals:   06/05/15 1428  BP: 104/70  Pulse: 99  Temp: 98.4 F (36.9 C)  TempSrc: Oral  Height: 5\' 3"  (1.6 m)  Weight: 224 lb 8 oz (101.833 kg)   GEN: WDWN, NAD, Non-toxic, A & O x 3 HEENT: Atraumatic, Normocephalic. Neck supple. No masses, No LAD. Ears and Nose: No external deformity. CV: RRR, No M/G/R. No JVD. No thrill. No extra heart sounds. PULM: CTA B, no wheezes, crackles, rhonchi. No retractions. No resp. distress. No accessory muscle use. EXTR: No c/c/e NEURO Normal gait.  PSYCH: Normally interactive. Conversant. Not depressed or anxious appearing.  Calm demeanor.   Laboratory and Imaging Data:  Assessment and Plan:   Classical migraine without intractable migraine  Excellent response on Depakote.  Discussed watching diet and weight.  She is overall very pleased.  Follow-up: No Follow-up on file.  Signed,  Elpidio Galea. Jere Vanburen, MD   Patient's Medications  New Prescriptions   No medications on file    Previous Medications   BILBERRY, VACCINIUM MYRTILLUS, 1000 MG CAPS    Take 2 capsules by mouth at bedtime.   BUPROPION (WELLBUTRIN XL) 300 MG 24 HR TABLET    TAKE 1 TABLET BY MOUTH DAILY   CARVEDILOL (COREG) 6.25 MG TABLET    Take 1 tablet (6.25 mg total) by mouth 2 (two) times daily.   CHOLECALCIFEROL (VITAMIN D-3) 5000 UNITS TABS    Take 1 capsule by mouth daily.   CYCLOBENZAPRINE (FLEXERIL) 5 MG TABLET    TAKE 1 TABLET BY MOUTH 3 TIMES A DAY AS NEEDED FOR MUSCLE SPASMS.   DIAZEPAM (VALIUM) 5 MG TABLET  5 mg every 6 (six) hours as needed.    DIVALPROEX (DEPAKOTE ER) 500 MG 24 HR TABLET    Take 2 tablets (1,000 mg total) by mouth daily.   DOCUSATE SODIUM (COLACE PO)    Take 1 tablet by mouth as needed (CONSTIPATION).    GLUCOSE BLOOD (ONE TOUCH ULTRA TEST) TEST STRIP    Use to check blood sugar once daily.   HYDROCHLOROTHIAZIDE (MICROZIDE) 12.5 MG CAPSULE    Take 1 capsule (12.5 mg total) by mouth daily.   HYDROCODONE-ACETAMINOPHEN (NORCO/VICODIN) 5-325 MG TABLET    Take 1 tablet by mouth every 6 (six) hours as needed for moderate pain.   LEVOTHYROXINE (SYNTHROID, LEVOTHROID) 75 MCG TABLET    TAKE 1 TABLET BY MOUTH DAILY   LISINOPRIL (PRINIVIL,ZESTRIL) 5 MG TABLET    Take 1 tablet (5 mg total) by mouth daily.   MELOXICAM (MOBIC) 7.5 MG TABLET    TAKE 1 TABLET BY MOUTH DAILY   MULTIPLE VITAMINS-MINERALS (ALIVE WOMENS GUMMY) CHEW    Chew 2 each by mouth daily.   OMEPRAZOLE (PRILOSEC) 40 MG CAPSULE    Take 1 capsule (40 mg total) by mouth daily before breakfast.   RESTASIS 0.05 % OPHTHALMIC EMULSION       VAGIFEM 10 MCG TABS VAGINAL TABLET    Place 10 mcg vaginally. Sunday and Wednesday   WHEAT DEXTRIN (BENEFIBER) POWD    Take 2 scoop by mouth at bedtime.  Modified Medications   No medications on file  Discontinued Medications   No medications on file

## 2015-06-05 NOTE — Progress Notes (Signed)
Pre visit review using our clinic review tool, if applicable. No additional management support is needed unless otherwise documented below in the visit note. 

## 2015-06-07 ENCOUNTER — Encounter: Payer: Self-pay | Admitting: Family Medicine

## 2015-06-12 ENCOUNTER — Other Ambulatory Visit: Payer: Self-pay | Admitting: Family Medicine

## 2015-06-23 ENCOUNTER — Ambulatory Visit (INDEPENDENT_AMBULATORY_CARE_PROVIDER_SITE_OTHER): Payer: 59 | Admitting: Cardiology

## 2015-06-23 ENCOUNTER — Encounter: Payer: Self-pay | Admitting: Cardiology

## 2015-06-23 VITALS — BP 122/86 | HR 84 | Ht 63.0 in | Wt 220.7 lb

## 2015-06-23 DIAGNOSIS — I5022 Chronic systolic (congestive) heart failure: Secondary | ICD-10-CM

## 2015-06-23 DIAGNOSIS — I10 Essential (primary) hypertension: Secondary | ICD-10-CM | POA: Diagnosis not present

## 2015-06-23 MED ORDER — CARVEDILOL 12.5 MG PO TABS
12.5000 mg | ORAL_TABLET | Freq: Two times a day (BID) | ORAL | Status: DC
Start: 2015-06-23 — End: 2015-08-11

## 2015-06-23 MED ORDER — LOSARTAN POTASSIUM 50 MG PO TABS: 50.0000 mg | ORAL_TABLET | Freq: Every day | ORAL | Status: DC

## 2015-06-23 NOTE — Patient Instructions (Signed)
Increase carvedilol to 12.5 mg twice a day  Stop lisinopril.  Start losartan 50 mg daily.  I will see you in 3 months.

## 2015-06-23 NOTE — Progress Notes (Signed)
Cardiology Office Note   Date:  06/23/2015   ID:  Kristine Curtis, DOB 1961/02/02, MRN [id number]  PCP:  [name], MD  Cardiologist:   Peter Swaziland, MD   Chief Complaint  Patient presents with  . Follow-up    3 months:  No complaints of chest pain.  Occas. SOB, dry cough since December after a URI.  Occasl. mild edema.  Occas mild lightheadedness.  Cramping in calves with walking.      History of Present Illness: Kristine Curtis is a 55 y.o. female who presented for evaluation of an irregular heart beat. She was evaluated in over 10 years ago for a dilated cardiomyopathy and PVCs.  Coronary arteries were normal by cardiac cath.  She was seen by Dr. Ladona Ridgel for EP evaluation. No change in symptoms with beta blocker and essentially reassured.  With recent symptoms she had evaluation with an Echo and Holter monitor. Holter showed rare PAC and PVC. Echo showed EF 30-35% with mild enlargement. Diffuse hypokinesis. Cardiac MRI showed EF 44% with global hypokinesis. No scar or infiltration.  She was started on Coreg and lisinopril. On follow up today her primary complaint is of chronic fatigue. She has had a dry cough since December. Rarely has SOB . Weight at home stable 215-218 lbs.    Past Medical History  Diagnosis Date  . Allergic rhinitis due to pollen   . Fibromyalgia   . Asthma   . Depression   . GERD (gastroesophageal reflux disease)   . Hypertension   . Hypothyroidism   . Osteoarthritis   . Restless leg syndrome   . Vertigo   . IBS (irritable bowel syndrome)   . Family history of polycystic kidney 11/16/2012    neg CT  . Sicca (HCC) 11/22/2014  . Internal hemorrhoids   . PVC (premature ventricular contraction)   . Chronic systolic congestive heart failure (HCC) 03/22/2015    Past Surgical History  Procedure Laterality Date  . Abdominal hysterectomy    . Cesarean section    . Tubal ligation  1992  . Cholecystectomy  10-2008  . Shoulder arthroscopy w/  subacromial decompression and distal clavicle excision Right     rotator cuff repair  . Eye surgery      2 2013 and 1981/DCR of right eye 05/2014  . Lumbar disc surgery  2016    L3-4  . Esophagogastroduodenoscopy    . Colonoscopy       Current Outpatient Prescriptions  Medication Sig Dispense Refill  . Bilberry, Vaccinium myrtillus, 1000 MG CAPS Take 2 capsules by mouth at bedtime.    Marland Kitchen buPROPion (WELLBUTRIN XL) 300 MG 24 hr tablet TAKE 1 TABLET BY MOUTH DAILY 30 tablet 5  . Cholecalciferol (VITAMIN D-3) 5000 UNITS TABS Take 1 capsule by mouth daily.    . cyclobenzaprine (FLEXERIL) 5 MG tablet TAKE 1 TABLET BY MOUTH 3 TIMES A DAY AS NEEDED FOR MUSCLE SPASMS. 30 tablet 5  . diazepam (VALIUM) 5 MG tablet 5 mg every 6 (six) hours as needed.     . divalproex (DEPAKOTE ER) 500 MG 24 hr tablet Take 2 tablets (1,000 mg total) by mouth daily. 60 tablet 3  . Docusate Sodium (COLACE PO) Take 1 tablet by mouth as needed (CONSTIPATION).     Marland Kitchen glucose blood (ONE TOUCH ULTRA TEST) test strip Use to check blood sugar once daily. 100 each 3  . hydrochlorothiazide (MICROZIDE) 12.5 MG capsule Take 1 capsule (12.5 mg total) by mouth  daily. 30 capsule 9  . HYDROcodone-acetaminophen (NORCO/VICODIN) 5-325 MG tablet Take 1 tablet by mouth every 6 (six) hours as needed for moderate pain. 60 tablet 0  . hydroxychloroquine (PLAQUENIL) 200 MG tablet Take 400 mg by mouth daily.    Marland Kitchen levothyroxine (SYNTHROID, LEVOTHROID) 75 MCG tablet TAKE 1 TABLET BY MOUTH DAILY 30 tablet 5  . meloxicam (MOBIC) 7.5 MG tablet TAKE 1 TABLET BY MOUTH DAILY 30 tablet 5  . Multiple Vitamins-Minerals (ALIVE WOMENS GUMMY) CHEW Chew 2 each by mouth daily.    Marland Kitchen omeprazole (PRILOSEC) 40 MG capsule Take 1 capsule (40 mg total) by mouth daily before breakfast. 30 capsule 11  . RESTASIS 0.05 % ophthalmic emulsion     . VAGIFEM 10 MCG TABS vaginal tablet Place 10 mcg vaginally. Sunday and Wednesday    . Wheat Dextrin (BENEFIBER) POWD Take 2  scoop by mouth at bedtime.    . carvedilol (COREG) 12.5 MG tablet Take 1 tablet (12.5 mg total) by mouth 2 (two) times daily. 180 tablet 3  . losartan (COZAAR) 50 MG tablet Take 1 tablet (50 mg total) by mouth daily. 90 tablet 3   No current facility-administered medications for this visit.    Allergies:   Gabapentin (once-daily); Codeine; and Sulfonamide derivatives    Social History:  The patient  reports that she quit smoking about 26 years ago. Her smoking use included Cigarettes. She has a 24 pack-year smoking history. She has never used smokeless tobacco. She reports that she does not drink alcohol or use illicit drugs.   Family History:  The patient's family history includes Diabetes in her father; Glaucoma in her father; Heart disease in her father; Kidney failure in her brother; Pancreatic cancer in her mother.    ROS:  Please see the history of present illness.   Otherwise, review of systems are positive for none.   All other systems are reviewed and negative.    PHYSICAL EXAM: VS:  BP 122/86 mmHg  Pulse 84  Ht  (1.6 m)  Wt 100.109 kg (220 lb 11.2 oz)  BMI 39.11 kg/m2 , BMI Body mass index is 39.11 kg/(m^2). GEN: Well nourished, obese,  in no acute distress HEENT: normal Neck: no JVD, carotid bruits, or masses Cardiac: RRR; no murmurs, rubs, or gallops,no edema  Respiratory:  clear to auscultation bilaterally, normal work of breathing GI: soft, nontender, nondistended, + BS MS: no deformity or atrophy Skin: warm and dry, no rash Neuro:  Strength and sensation are intact Psych: euthymic mood, full affect   EKG:  EKG is not ordered today.    Recent Labs: 11/21/2014: TSH 2.42 02/02/2015: ALT 18; Hemoglobin 13.3; Platelets 338.0 03/13/2015: BUN 13; Potassium 4.4; Sodium 139 03/17/2015: Creatinine, Ser 0.96    Lipid Panel    Component Value Date/Time   CHOL 159 02/02/2015 0901   TRIG 93.0 02/02/2015 0901   HDL 54.90 02/02/2015 0901   CHOLHDL 3 02/02/2015  0901   VLDL 18.6 02/02/2015 0901   LDLCALC 85 02/02/2015 0901      Wt Readings from Last 3 Encounters:  06/23/15 100.109 kg (220 lb 11.2 oz)  06/05/15 101.833 kg (224 lb 8 oz)  05/02/15 97.864 kg (215 lb 12 oz)      Other studies Reviewed: Additional studies/ records that were reviewed today include: Echo and MRI Review of the above records demonstrates:  Echo 04/26/15:Study Conclusions  - Left ventricle: The cavity size was mildly dilated. Systolic function was moderately to severely reduced. The  estimated ejection fraction was in the range of 30% to 35%. Moderate diffuse hypokinesis with no identifiable regional variations. There was fusion of early and atrial contributions to ventricular filling. The study is not technically sufficient to allow evaluation of LV diastolic function. No evidence of thrombus. - Mitral valve: There was mild regurgitation. - Left atrium: The atrium was mildly dilated.  CARDIAC MRI  TECHNIQUE: The patient was scanned on a 1.5 Tesla GE magnet. A dedicated cardiac coil was used. Functional imaging was done using Fiesta sequences. 2,3, and 4 chamber views were done to assess for RWMA's. Modified Simpson's rule using a short axis stack was used to calculate an ejection fraction on a dedicated work [employer]. The patient received 38 cc of Multihance. After 10 minutes inversion recovery sequences were used to assess for infiltration and scar tissue.  CONTRAST: 38 cc Multihance  FINDINGS: There was mild LAE. The RA/RV were normal in size and function. There was mild LVE. There was diffuse hypokinesis. The mitral aortic and tricuspid valves were structurally normal  There was no pericardial effusion There was no ASD/VSD. The quantitative EF was 44% (EDV 110 cc ESV 61 cc SV 49 cc) Delayed gadolinium images showed no scar or infarct tissue  IMPRESSION: 1) Mild LVE with diffuse hypokinesis EF 44%  2) No  scar tissue or infarct on delayed gadolinium images  3) Mild LAE  4) Normal RV  5) Normal Valves  6) No pericardial effusion  Charlton Haws   Electronically Signed  By: Charlton Haws M.D.  On: 03/17/2015 13:22  ASSESSMENT AND PLAN:  1.  Palpitations. Few PVCs and PACs on Holter. Continue beta blocker.  2. Nonischemic cardiomyopathy with chronic systolic CHF. Class 1. Echo showed EF 30-35%. By MRI EF 44%. MRI is more accurate given body habitus. With cough will stop lisinopril and start losartan 50 mg daily. Will increase coreg to 12.5 mg bid. Follow up in 3 months to optimize therapy. Once on optimal therapy will repeat Echo.   Current medicines are reviewed at length with the patient today.  The patient does not have concerns regarding medicines.  The following changes have been made:  See above.  Labs/ tests ordered today include:  No orders of the defined types were placed in this encounter.     Disposition:   Follow up in 3 months.  Signed, Peter Swaziland, MD  06/23/2015 12:02 PM    [facility] [address] [place], [place], [address] Phone [phone], Fax [phone]

## 2015-08-09 ENCOUNTER — Other Ambulatory Visit: Payer: Self-pay | Admitting: Family Medicine

## 2015-08-09 NOTE — Telephone Encounter (Signed)
Last office visit 06/05/2015.  Last refilled 08/04/2014 for #30 with 5 refills.  Ok to refill?

## 2015-08-10 ENCOUNTER — Telehealth: Payer: Self-pay | Admitting: Cardiology

## 2015-08-10 NOTE — Telephone Encounter (Signed)
New Message  Pt requested to speak w/ RN concerning recent bp changes- would not specify. Please call back and discuss.

## 2015-08-10 NOTE — Telephone Encounter (Signed)
Returned call to patient.She stated she has had 2 " strange " episodes.Stated yesterday around 7:00 pm she walked to mail box and did not think she could walk back.Stated she had heaviness in both legs and arms.Numbness in left side of face.B/P 200/85 pulse 107.Stated she also had pain in upper abdomen followed by a loose stool and vomiting x 1.Stated today she does not have any face numbness,but she has been having heaviness in both legs and arms at times.B/P 137/90 pulse 100.Advised I will speak to DOD and call you back.  Spoke to DOD Dr.Croitoru he advised schedule appointment with extender.Appointment scheduled with Fransico Michael PA 08/11/15 at 9:30 am at [facility] office.Advised to go to ER if needed.

## 2015-08-11 ENCOUNTER — Encounter: Payer: Self-pay | Admitting: Cardiology

## 2015-08-11 ENCOUNTER — Ambulatory Visit (INDEPENDENT_AMBULATORY_CARE_PROVIDER_SITE_OTHER): Payer: 59 | Admitting: Cardiology

## 2015-08-11 VITALS — BP 120/86 | HR 93 | Ht 63.0 in | Wt 223.8 lb

## 2015-08-11 DIAGNOSIS — I5022 Chronic systolic (congestive) heart failure: Secondary | ICD-10-CM | POA: Diagnosis not present

## 2015-08-11 DIAGNOSIS — I10 Essential (primary) hypertension: Secondary | ICD-10-CM | POA: Diagnosis not present

## 2015-08-11 LAB — BRAIN NATRIURETIC PEPTIDE: Brain Natriuretic Peptide: <4 pg/mL (ref <100)

## 2015-08-11 LAB — BASIC METABOLIC PANEL
BUN: 13 mg/dL (ref 7–25)
CO2: 27 mmol/L (ref 20–31)
Calcium: 9 mg/dL (ref 8.6–10.4)
Chloride: 103 mmol/L (ref 98–110)
Creat: 0.82 mg/dL (ref 0.50–1.05)
Glucose, Bld: 80 mg/dL (ref 65–99)
Potassium: 3.8 mmol/L (ref 3.5–5.3)
Sodium: 140 mmol/L (ref 135–146)

## 2015-08-11 MED ORDER — FUROSEMIDE 20 MG PO TABS: 20.0000 mg | ORAL_TABLET | Freq: Every day | ORAL | Status: DC

## 2015-08-11 MED ORDER — CARVEDILOL 25 MG PO TABS: 25.0000 mg | ORAL_TABLET | Freq: Two times a day (BID) | ORAL | Status: DC

## 2015-08-11 NOTE — Patient Instructions (Addendum)
Medication Instructions:  Your physician has recommended you make the following change in your medication:  1.  STOP the Hydrochlorothiazide 2.  START the Lasix 20 mg taking 1 tablet daily 3.  INCREASE the Coreg to 25 mg taking 1 tablet twice a day (2 of the 12.5 twice a day until they are gone)  Labwork: TODAY:  BMET & BNP 08/21/15:  BMET  Testing/Procedures: None ordered  Follow-Up: Your physician recommends that you schedule a follow-up appointment in: KEEP YOUR APPOINTMENT AS SCHEDULED   Any Other Special Instructions Will Be Listed Below (If Applicable).  MONITOR YOUR BLOOD PRESSURE AT HOME.  CALL OUR OFFICE IF YOU HAVE LOW BLOOD PRESSURES  If you need a refill on your cardiac medications before your next appointment, please call your pharmacy.

## 2015-08-11 NOTE — Progress Notes (Signed)
08/11/2015 Kristine Curtis   11-30-1960  [id number]  Primary Physician [name], MD Primary Cardiologist: Dr. Swaziland   Reason for Visit/CC: Chronic Systolic HF; medication titration; HTN  HPI:  Patient is a 55 year old female, followed by Dr. Swaziland. She was first evaluated over 10 years ago for dilated cardiomyopathy and PVCs. Coronary arteries were normal by cath. She was seen by Dr. Ladona Ridgel for EP evaluation. No change in symptoms with beta blocker and essentially reassured. She was recently evaluated by Dr. Swaziland 06/23/2015 given complaints of irregular heartbeat. Subsequently Dr. Swaziland ordered a 2-D echocardiogram and a Holter monitor. Holter showed rare PACs and PVCs. Echocardiogram showed an EF of 30-35% with mild enlargement and diffuse hypokinesis. Subsequent cardiac MRI showed an EF of 44% with global hypokinesis. There was no evidence for scar or infiltration. Dr. Swaziland started her on medical therapy with Coreg and lisinopril. However, given the development of a dry cough with ACE inhibitor therapy, Dr. Swaziland changed her over to losartan 50 mg daily.  She now presents back to clinic for follow-up to optimize medical therapy. Plan is to repeat a 2-D echocardiogram when she is on maximal medical therapy to reassess need for ICD. Patient also notes recent increases in her blood pressure readings at home. Systolic blood pressures have averaged in the 150s to 160s however she did note a one-time pressure in the 200s. This was accompanied by headache. She reports full medication compliance of her meds. She has been adherent with a low-sodium diet. She denies any external stressors. She denies chest pain but does note mild dyspnea on exertion. She also reports what sounds to be orthopnea while sleeping at night. No PND. She denies any significant palpitations, lightheadedness, dizziness, syncope/near-syncope.   Her blood pressure today in clinic is stable at 120/86. HR is 93  bpm   Current Outpatient Prescriptions  Medication Sig Dispense Refill  . Bilberry, Vaccinium myrtillus, 1000 MG CAPS Take 2 capsules by mouth at bedtime.    Marland Kitchen buPROPion (WELLBUTRIN XL) 300 MG 24 hr tablet TAKE 1 TABLET BY MOUTH DAILY 30 tablet 5  . carvedilol (COREG) 12.5 MG tablet Take 1 tablet (12.5 mg total) by mouth 2 (two) times daily. 180 tablet 3  . Cholecalciferol (VITAMIN D-3) 5000 UNITS TABS Take 1 capsule by mouth daily.    . cyclobenzaprine (FLEXERIL) 5 MG tablet Take 1 tablet by mouth 2 (two) times daily.    . diazepam (VALIUM) 5 MG tablet Take 5 mg by mouth every 6 (six) hours as needed.     . divalproex (DEPAKOTE ER) 500 MG 24 hr tablet Take 2 tablets (1,000 mg total) by mouth daily. 60 tablet 3  . Docusate Sodium (COLACE PO) Take 1 tablet by mouth as needed (CONSTIPATION).     Marland Kitchen glucose blood (ONE TOUCH ULTRA TEST) test strip Use to check blood sugar once daily. 100 each 3  . hydrochlorothiazide (MICROZIDE) 12.5 MG capsule Take 1 capsule (12.5 mg total) by mouth daily. 30 capsule 9  . HYDROcodone-acetaminophen (NORCO/VICODIN) 5-325 MG tablet Take 1 tablet by mouth every 6 (six) hours as needed for moderate pain. 60 tablet 0  . hydroxychloroquine (PLAQUENIL) 200 MG tablet Take 400 mg by mouth daily.    Marland Kitchen levothyroxine (SYNTHROID, LEVOTHROID) 75 MCG tablet TAKE 1 TABLET BY MOUTH DAILY 30 tablet 5  . losartan (COZAAR) 50 MG tablet Take 1 tablet (50 mg total) by mouth daily. 90 tablet 3  . meloxicam (MOBIC) 7.5 MG tablet TAKE 1  TABLET BY MOUTH DAILY 30 tablet 5  . Multiple Vitamins-Minerals (ALIVE WOMENS GUMMY) CHEW Chew 2 each by mouth daily.    Marland Kitchen omeprazole (PRILOSEC) 40 MG capsule Take 1 capsule (40 mg total) by mouth daily before breakfast. 30 capsule 11  . RESTASIS 0.05 % ophthalmic emulsion     . VAGIFEM 10 MCG TABS vaginal tablet Place 10 mcg vaginally. Sunday and Wednesday    . Wheat Dextrin (BENEFIBER) POWD Take 2 scoop by mouth as needed. constipation     No current  facility-administered medications for this visit.    Allergies  Allergen Reactions  . Gabapentin (Once-Daily)     "side effect to severe"  . Codeine Itching  . Sulfonamide Derivatives Itching    Social History   Social History  . Marital Status: Married    Spouse Name: N/A  . Number of Children: 2  . Years of Education: N/A   Occupational History  . quality assurance tech    Social History Main Topics  . Smoking status: Former Smoker -- 2.00 packs/day for 12 years    Types: Cigarettes    Quit date: 05/06/1989  . Smokeless tobacco: Never Used     Comment: quit smoking 1990  . Alcohol Use: No  . Drug Use: No  . Sexual Activity: Not on file   Other Topics Concern  . Not on file   Social History Narrative   Former Dr. Andrey Campanile then Dr. 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  Cardiovascular: negative for chest pain, dyspnea on exertion, edema, orthopnea, palpitations, paroxysmal nocturnal dyspnea or shortness of breath Dermatological: negative for rash Respiratory: negative for cough or wheezing Urologic: negative for hematuria Abdominal: negative for nausea, vomiting, diarrhea, bright red blood per rectum, melena, or hematemesis Neurologic: negative for visual changes, syncope, or dizziness All other systems reviewed and are otherwise negative except as noted above.    Blood pressure 120/86, pulse 93, height 5\' 3"  (1.6 m), weight 223 lb 12.8 oz (101.515 kg).  General appearance: alert, cooperative and no distress Neck: no carotid bruit and no JVD Lungs: clear to auscultation bilaterally Heart: regular rate and rhythm, S1, S2 normal, no murmur, click, rub or gallop Extremities: no LEE Pulses: 2+ and symmetric Skin: warm and dry Neurologic: Grossly normal  EKG not performed  ASSESSMENT AND PLAN:   1. Chronic Systolic HF: EF is 30-35%. She appears fairly  euvolemic based on physical exam however she does report symptoms of mild exertional dyspnea and orthopnea. We will therefore check a BNP today as well as a BMP. We will change her diuretic from low-dose hydrochlorothiazide to low-dose Lasix, 20 g daily. As outlined above we will check a BMP today for baseline assessment of renal function and potassium levels. We will need to recheck another BMP in 7 days to ensure that her potassium levels and renal function are both stable. Also given recent reports of elevated blood pressure readings at home as well as elevated pulse rate in clinic today, we'll further optimize her beta blocker therapy with carvedilol. We will increase this dose to 25 mg twice BID, which will also help her HF. We will keep her losartan dose the same at 50 mg daily. We discussed the importance of a low sodium diet and continuation of daily weights at home. She is to notify our office if she gains more than  3 pounds in 24 hour period or more than 5 pounds in one week.  2. HTN: Increase carvedilol to 25 mg twice a day. Discontinue hydrochlorothiazide and start low-dose Lasix, 20 mg daily. Low-sodium diet. Patient advised to continue to monitor blood pressure closely at home and to report any issues of hypotension.   PLAN  Keep f/u with Dr. Swaziland next month.   Temperance Kelemen PA-C 08/11/2015 10:06 AM

## 2015-08-18 ENCOUNTER — Other Ambulatory Visit (INDEPENDENT_AMBULATORY_CARE_PROVIDER_SITE_OTHER): Payer: 59 | Admitting: *Deleted

## 2015-08-18 DIAGNOSIS — I10 Essential (primary) hypertension: Secondary | ICD-10-CM

## 2015-08-18 DIAGNOSIS — I5022 Chronic systolic (congestive) heart failure: Secondary | ICD-10-CM | POA: Diagnosis not present

## 2015-08-18 LAB — BASIC METABOLIC PANEL
BUN: 14 mg/dL (ref 7–25)
CO2: 26 mmol/L (ref 20–31)
Calcium: 8.4 mg/dL — ABNORMAL LOW (ref 8.6–10.4)
Chloride: 105 mmol/L (ref 98–110)
Creat: 0.78 mg/dL (ref 0.50–1.05)
Glucose, Bld: 90 mg/dL (ref 65–99)
Potassium: 3.9 mmol/L (ref 3.5–5.3)
Sodium: 141 mmol/L (ref 135–146)

## 2015-08-18 NOTE — Addendum Note (Signed)
Addended by: Tonita Phoenix on: 08/18/2015 10:45 AM   Modules accepted: Orders

## 2015-09-15 ENCOUNTER — Encounter: Payer: Self-pay | Admitting: Family Medicine

## 2015-09-21 ENCOUNTER — Ambulatory Visit: Payer: 59 | Admitting: Cardiology

## 2015-10-04 ENCOUNTER — Other Ambulatory Visit: Payer: Self-pay

## 2015-10-04 MED ORDER — HYDROCODONE-ACETAMINOPHEN 5-325 MG PO TABS: 1.0000 | ORAL_TABLET | Freq: Four times a day (QID) | ORAL | Status: DC | PRN

## 2015-10-04 MED ORDER — DIAZEPAM 5 MG PO TABS: 5.0000 mg | ORAL_TABLET | Freq: Four times a day (QID) | ORAL | Status: DC | PRN

## 2015-10-04 NOTE — Telephone Encounter (Signed)
Pt left v/m requesting rx hydrocodone apap.(last printed # 60 on 05/02/15) and refill on diazepam 5 mg (do not see where Dr Patsy Lager has previously filled the diazepam) pt request diazepam for muscle spasms and helps pt rest at night. Call when ready for pick up. Pt last seen 06/05/15.Please advise.

## 2015-10-04 NOTE — Telephone Encounter (Signed)
[facility] notified prescriptions are ready to be picked up at the front desk.

## 2015-10-05 ENCOUNTER — Encounter: Payer: Self-pay | Admitting: Family Medicine

## 2015-10-12 ENCOUNTER — Ambulatory Visit (INDEPENDENT_AMBULATORY_CARE_PROVIDER_SITE_OTHER): Payer: 59 | Admitting: Cardiology

## 2015-10-12 ENCOUNTER — Encounter: Payer: Self-pay | Admitting: Cardiology

## 2015-10-12 VITALS — BP 130/90 | HR 100 | Ht 63.0 in | Wt 226.4 lb

## 2015-10-12 DIAGNOSIS — I5022 Chronic systolic (congestive) heart failure: Secondary | ICD-10-CM

## 2015-10-12 MED ORDER — LOSARTAN POTASSIUM 100 MG PO TABS: 100.0000 mg | ORAL_TABLET | Freq: Every day | ORAL | Status: DC

## 2015-10-12 NOTE — Progress Notes (Signed)
10/12/2015 Kristine Curtis   Nov 15, 1960  [id number]  Primary Physician [name], MD Primary Cardiologist: Dr. Swaziland   Reason for Visit/CC: F/u for Chronic Systolic HF  HPI:  Patient is a 55 year old female, followed by Dr. Swaziland. She was first evaluated over 10 years ago for dilated cardiomyopathy and PVCs. Coronary arteries were normal by cath. She was seen by Dr. Ladona Ridgel for EP evaluation. No change in symptoms with beta blocker and essentially reassured. She was recently evaluated by Dr. Swaziland 06/23/2015 given complaints of irregular heartbeat. Subsequently Dr. Swaziland ordered a 2-D echocardiogram and a Holter monitor. Holter showed rare PACs and PVCs. Echocardiogram showed an EF of 30-35% with mild enlargement and diffuse hypokinesis. Subsequent cardiac MRI showed an EF of 44% with global hypokinesis. There was no evidence for scar or infiltration. Dr. Swaziland started her on medical therapy with Coreg and lisinopril. However, given the development of a dry cough with ACE inhibitor therapy, Dr. Swaziland changed her over to losartan 50 mg daily. Plan is to repeat a 2-D echocardiogram when she is on maximal medical therapy to reassess need for ICD.    I re-evaluated her 08/11/15 for medication management/ optimization. At that time, she noted increased dyspnea and increased BP. She was noted to have slight fluid retention based on exam. I changed her diuretic from HCTZ to Lasix and increased her carvediolol to 25 mg BID. Her losartan was kept at 50 mg daily. She was scheduled for follow-up to see Dr. Swaziland in May however she had to cancel the appointment as she had to travel to [place] for family reasons. She presents back to clinic today for follow-up. She reports that she has done well. Her breathing and blood pressure are both improved. She denies any exertional dyspnea, no orthopnea, PND or lower extremity edema. She also denies any chest pain. She reports full medication compliance. Her  blood pressure today is 130/90.   Current Outpatient Prescriptions  Medication Sig Dispense Refill  . Bilberry, Vaccinium myrtillus, 1000 MG CAPS Take 2 capsules by mouth at bedtime.    Marland Kitchen buPROPion (WELLBUTRIN XL) 300 MG 24 hr tablet TAKE 1 TABLET BY MOUTH DAILY 30 tablet 5  . carvedilol (COREG) 25 MG tablet Take 1 tablet (25 mg total) by mouth 2 (two) times daily. 180 tablet 3  . Cholecalciferol (VITAMIN D-3) 5000 UNITS TABS Take 1 capsule by mouth daily.    . cyclobenzaprine (FLEXERIL) 5 MG tablet Take 1 tablet by mouth 2 (two) times daily.    . diazepam (VALIUM) 5 MG tablet Take 5 mg by mouth every 6 (six) hours as needed for anxiety.    [name] Sodium (COLACE PO) Take 1 tablet by mouth as needed (CONSTIPATION).     . furosemide (LASIX) 20 MG tablet Take 1 tablet (20 mg total) by mouth daily. 90 tablet 3  . glucose blood (ONE TOUCH ULTRA TEST) test strip Use to check blood sugar once daily. 100 each 3  . HYDROcodone-acetaminophen (NORCO/VICODIN) 5-325 MG tablet Take 1 tablet by mouth every 6 (six) hours as needed for moderate pain. 60 tablet 0  . hydroxychloroquine (PLAQUENIL) 200 MG tablet Take 200 mg by mouth 2 (two) times daily.     Marland Kitchen levothyroxine (SYNTHROID, LEVOTHROID) 75 MCG tablet TAKE 1 TABLET BY MOUTH DAILY 30 tablet 5  . losartan (COZAAR) 50 MG tablet Take 1 tablet (50 mg total) by mouth daily. 90 tablet 3  . meloxicam (MOBIC) 7.5 MG tablet TAKE 1 TABLET BY  MOUTH DAILY 30 tablet 5  . Multiple Vitamins-Minerals (ALIVE WOMENS GUMMY) CHEW Chew 2 tablets by mouth daily.     Marland Kitchen omeprazole (PRILOSEC) 40 MG capsule Take 1 capsule (40 mg total) by mouth daily before breakfast. 30 capsule 11  . Wheat Dextrin (BENEFIBER) POWD Take 2 scoop by mouth daily as needed (for constipation).      No current facility-administered medications for this visit.    Allergies  Allergen Reactions  . Gabapentin (Once-Daily)     "side effect to severe"  . Codeine Itching  . Sulfonamide Derivatives  Itching    Social History   Social History  . Marital Status: Married    Spouse Name: N/A  . Number of Children: 2  . Years of Education: N/A   Occupational History  . quality assurance tech    Social History Main Topics  . Smoking status: Former Smoker -- 2.00 packs/day for 12 years    Types: Cigarettes    Quit date: 05/06/1989  . Smokeless tobacco: Never Used     Comment: quit smoking 1990  . Alcohol Use: No  . Drug Use: No  . Sexual Activity: Not on file   Other Topics Concern  . Not on file   Social History Narrative   Former Dr. Andrey Campanile then Dr. 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  Cardiovascular: negative for chest pain, dyspnea on exertion, edema, orthopnea, palpitations, paroxysmal nocturnal dyspnea or shortness of breath Dermatological: negative for rash Respiratory: negative for cough or wheezing Urologic: negative for hematuria Abdominal: negative for nausea, vomiting, diarrhea, bright red blood per rectum, melena, or hematemesis Neurologic: negative for visual changes, syncope, or dizziness All other systems reviewed and are otherwise negative except as noted above.    Blood pressure 130/90, pulse 100, height 5\' 3"  (1.6 m), weight 226 lb 6.4 oz (102.694 kg), SpO2 97 %.  General appearance: alert, cooperative and no distress Neck: no carotid bruit and no JVD Lungs: clear to auscultation bilaterally Heart: regular rate and rhythm, S1, S2 normal, no murmur, click, rub or gallop Extremities: no LEE Pulses: 2+ and symmetric Skin: warm and dry Neurologic: Grossly normal   EKG not performed.  ASSESSMENT AND PLAN:   1. Chronic systolic heart failure:  EF is 30-35%. she is euvolemic on physical exam. She denies dyspnea, lower extremity edema, orthopnea, PND. No exertional symptoms. Further optimize her medications. She is currently on the maximum  dose of carvedilol at 25 mg twice a day. We will further increase her losartan 200 mg daily. There is plenty of room for blood pressure to do so. Blood pressure today is 130/90. Continue daily Lasix. We discussed the importance of a low sodium diet and continuation of daily weights at home. She is to notify our office if she gains more than 3 pounds in 24 hour period or more than 5 pounds in one week. Plan is to repeat a 2-D echocardiogram with she is on maximal medical therapy to reassess need for ICD.   2. Hypertension: Blood pressure is stable at 130/90. We'll further increase her losartan for additional blood pressure control as well as for medication optimization for her chronic systolic heart failure.   PLAN  F/u with Dr. Swaziland in 2-3 months.   Robbie Lis PA-C 10/12/2015 3:49 PM

## 2015-10-12 NOTE — Patient Instructions (Signed)
Your physician has recommended you make the following change in your medication:   1.) The losartan has been increased to 100 mg daily. (You can take 2 of your 50 mg tablets daily before starting the 100 mg dose prescription.)  Your physician recommends that you schedule a follow-up appointment in: 6-8 weeks with Dr Swaziland.

## 2015-10-23 ENCOUNTER — Encounter: Payer: Self-pay | Admitting: Family Medicine

## 2015-11-13 ENCOUNTER — Encounter: Payer: Self-pay | Admitting: Cardiology

## 2015-11-13 ENCOUNTER — Ambulatory Visit (INDEPENDENT_AMBULATORY_CARE_PROVIDER_SITE_OTHER): Payer: 59 | Admitting: Cardiology

## 2015-11-13 ENCOUNTER — Other Ambulatory Visit: Payer: Self-pay

## 2015-11-13 VITALS — BP 126/95 | HR 106 | Ht 63.0 in | Wt 227.2 lb

## 2015-11-13 DIAGNOSIS — I10 Essential (primary) hypertension: Secondary | ICD-10-CM

## 2015-11-13 DIAGNOSIS — R000 Tachycardia, unspecified: Secondary | ICD-10-CM

## 2015-11-13 DIAGNOSIS — I5022 Chronic systolic (congestive) heart failure: Secondary | ICD-10-CM

## 2015-11-13 MED ORDER — CARVEDILOL 25 MG PO TABS: ORAL_TABLET | ORAL | Status: DC

## 2015-11-13 NOTE — Patient Instructions (Addendum)
Increase Coreg to 37.5 mg twice a day. That is a tablet and a half twice a day.    Try and lose weight  We will schedule you for an Echocardiogram  I will see you in 4 months.

## 2015-11-13 NOTE — Progress Notes (Signed)
Cardiology Office Note   Date:  11/13/2015   ID:  Kristine Curtis, DOB 08-Apr-1961, MRN [id number]  PCP:  [name], MD  Cardiologist:   Huzaifa Viney Sw[place]and, MD   No chief complaint on file.     History of Present Illness: Kristine Curtis is a 55 y.o. female who is seen for follow up nonischemic cardiomyopathy. She was evaluated in over 10 years ago for a dilated cardiomyopathy and PVCs.  Coronary arteries were normal by cardiac cath.  She was seen by Dr. Ladona Ridgel for EP evaluation. No change in symptoms with beta blocker and essentially reassured.  Last fall she presented with palpitations. She had evaluation with an Echo and Holter monitor. Holter showed rare PAC and PVC. Echo showed EF 30-35% with mild enlargement. Diffuse hypokinesis. Cardiac MRI showed EF 44% with global hypokinesis. No scar or infiltration.  She was started on Coreg and losartan. Medications adjusted.   On follow up today she is doing well. Minimal right ankle edema. Occasional chest pressure when she is lying down. Some skipping and racing in her chest. Has recently been out of town for 2 months and wasn't eating well.    Past Medical History  Diagnosis Date  . Allergic rhinitis due to pollen   . Fibromyalgia   . Asthma   . Depression   . GERD (gastroesophageal reflux disease)   . Hypertension   . Hypothyroidism   . Osteoarthritis   . Restless leg syndrome   . Vertigo   . IBS (irritable bowel syndrome)   . Family history of polycystic kidney 11/16/2012    neg CT  . Sicca (HCC) 11/22/2014  . Internal hemorrhoids   . PVC (premature ventricular contraction)   . Chronic systolic congestive heart failure (HCC) 03/22/2015    Past Surgical History  Procedure Laterality Date  . Abdominal hysterectomy    . Cesarean section    . Tubal ligation  1992  . Cholecystectomy  10-2008  . Shoulder arthroscopy w/ subacromial decompression and distal clavicle excision Right     rotator cuff repair  . Eye surgery       2 2013 and 1981/DCR of right eye 05/2014  . Lumbar disc surgery  2016    L3-4  . Esophagogastroduodenoscopy    . Colonoscopy       Current Outpatient Prescriptions  Medication Sig Dispense Refill  . Bilberry, Vaccinium myrtillus, 1000 MG CAPS Take 2 capsules by mouth at bedtime.    Marland Kitchen buPROPion (WELLBUTRIN XL) 300 MG 24 hr tablet TAKE 1 TABLET BY MOUTH DAILY 30 tablet 5  . carvedilol (COREG) 25 MG tablet Take 1 tablet (25 mg total) by mouth 2 (two) times daily. 180 tablet 3  . Cholecalciferol (VITAMIN D-3) 5000 UNITS TABS Take 1 capsule by mouth daily.    . cyclobenzaprine (FLEXERIL) 5 MG tablet Take 1 tablet by mouth 2 (two) times daily.    . diazepam (VALIUM) 5 MG tablet Take 5 mg by mouth every 6 (six) hours as needed for anxiety.    [name] Sodium (COLACE PO) Take 1 tablet by mouth as needed (CONSTIPATION).     . furosemide (LASIX) 20 MG tablet Take 1 tablet (20 mg total) by mouth daily. 90 tablet 3  . glucose blood (ONE TOUCH ULTRA TEST) test strip Use to check blood sugar once daily. 100 each 3  . HYDROcodone-acetaminophen (NORCO/VICODIN) 5-325 MG tablet Take 1 tablet by mouth every 6 (six) hours as needed for moderate pain.  60 tablet 0  . hydroxychloroquine (PLAQUENIL) 200 MG tablet Take 200 mg by mouth 2 (two) times daily.     Marland Kitchen levothyroxine (SYNTHROID, LEVOTHROID) 75 MCG tablet TAKE 1 TABLET BY MOUTH DAILY 30 tablet 5  . losartan (COZAAR) 100 MG tablet Take 1 tablet (100 mg total) by mouth daily. 30 tablet 11  . meloxicam (MOBIC) 7.5 MG tablet TAKE 1 TABLET BY MOUTH DAILY 30 tablet 5  . Multiple Vitamins-Minerals (ALIVE WOMENS GUMMY) CHEW Chew 2 tablets by mouth daily.     Marland Kitchen omeprazole (PRILOSEC) 40 MG capsule Take 1 capsule (40 mg total) by mouth daily before breakfast. 30 capsule 11  . Wheat Dextrin (BENEFIBER) POWD Take 2 scoop by mouth daily as needed (for constipation).      No current facility-administered medications for this visit.    Allergies:   Gabapentin  (once-daily); Codeine; and Sulfonamide derivatives    Social History:  The patient  reports that she quit smoking about 26 years ago. Her smoking use included Cigarettes. She has a 24 pack-year smoking history. She has never used smokeless tobacco. She reports that she does not drink alcohol or use illicit drugs.   Family History:  The patient's family history includes Diabetes in her father; Glaucoma in her father; Heart disease in her father; Kidney failure in her brother; Pancreatic cancer in her mother.    ROS:  Please see the history of present illness.   Otherwise, review of systems are positive for none.   All other systems are reviewed and negative.    PHYSICAL EXAM: VS:  There were no vitals taken for this visit. , BMI There is no weight on file to calculate BMI. GEN: Well nourished, obese,  in no acute distress HEENT: normal Neck: no JVD, carotid bruits, or masses Cardiac: RRR; no murmurs, rubs, or gallops,no edema  Respiratory:  clear to auscultation bilaterally, normal work of breathing GI: soft, nontender, nondistended, + BS MS: no deformity or atrophy Skin: warm and dry, no rash Neuro:  Strength and sensation are intact Psych: euthymic mood, full affect   EKG:  EKG is  ordered today. It shows sinus tachycardia with nonspecific ST T abnormality. Rate 106.    Recent Labs: 11/21/2014: TSH 2.42 02/02/2015: ALT 18; Hemoglobin 13.3; Platelets 338.0 08/11/2015: Brain Natriuretic Peptide <4.0 08/18/2015: BUN 14; Creat 0.78; Potassium 3.9; Sodium 141    Lipid Panel    Component Value Date/Time   CHOL 159 02/02/2015 0901   TRIG 93.0 02/02/2015 0901   HDL 54.90 02/02/2015 0901   CHOLHDL 3 02/02/2015 0901   VLDL 18.6 02/02/2015 0901   LDLCALC 85 02/02/2015 0901      Wt Readings from Last 3 Encounters:  10/12/15 226 lb 6.4 oz (102.694 kg)  08/11/15 223 lb 12.8 oz (101.515 kg)  06/23/15 220 lb 11.2 oz (100.109 kg)      Other studies Reviewed: Additional studies/  records that were reviewed today include: Echo and MRI Review of the above records demonstrates:  Echo 04/26/15:Study Conclusions  - Left ventricle: The cavity size was mildly dilated. Systolic function was moderately to severely reduced. The estimated ejection fraction was in the range of 30% to 35%. Moderate diffuse hypokinesis with no identifiable regional variations. There was fusion of early and atrial contributions to ventricular filling. The study is not technically sufficient to allow evaluation of LV diastolic function. No evidence of thrombus. - Mitral valve: There was mild regurgitation. - Left atrium: The atrium was mildly dilated.  CARDIAC  MRI  TECHNIQUE: The patient was scanned on a 1.5 Tesla GE magnet. A dedicated cardiac coil was used. Functional imaging was done using Fiesta sequences. 2,3, and 4 chamber views were done to assess for RWMA's. Modified Simpson's rule using a short axis stack was used to calculate an ejection fraction on a dedicated work [employer]. The patient received 38 cc of Multihance. After 10 minutes inversion recovery sequences were used to assess for infiltration and scar tissue.  CONTRAST: 38 cc Multihance  FINDINGS: There was mild LAE. The RA/RV were normal in size and function. There was mild LVE. There was diffuse hypokinesis. The mitral aortic and tricuspid valves were structurally normal  There was no pericardial effusion There was no ASD/VSD. The quantitative EF was 44% (EDV 110 cc ESV 61 cc SV 49 cc) Delayed gadolinium images showed no scar or infarct tissue  IMPRESSION: 1) Mild LVE with diffuse hypokinesis EF 44%  2) No scar tissue or infarct on delayed gadolinium images  3) Mild LAE  4) Normal RV  5) Normal Valves  6) No pericardial effusion  Charlton Haws   Electronically Signed  By: Charlton Haws M.D.  On: 03/17/2015 13:22  ASSESSMENT AND PLAN:  1.   Palpitations. Few PVCs and PACs on Holter. Continue beta blocker.  2. Nonischemic cardiomyopathy with chronic systolic CHF. Class 1. Echo showed EF 30-35%. By MRI EF 44%. MRI is more accurate given body habitus.  Will increase coreg to 37.5 mg bid given increased HR and BP. Needs to lose weight. Will repeat Echo now. Follow up in 4 months.    Current medicines are reviewed at length with the patient today.  The patient does not have concerns regarding medicines.  The following changes have been made:  See above.  Labs/ tests ordered today include:  No orders of the defined types were placed in this encounter.     Disposition:   Follow up in 4 months  Signed, Heinrich Fertig [place], MD  11/13/2015 8:00 AM    [facility] [address] [place], [place], [address] Phone [phone], Fax [phone]

## 2015-11-28 ENCOUNTER — Ambulatory Visit (HOSPITAL_COMMUNITY): Payer: 59 | Attending: Cardiology

## 2015-11-28 ENCOUNTER — Other Ambulatory Visit (HOSPITAL_COMMUNITY): Payer: Self-pay

## 2015-11-28 DIAGNOSIS — I10 Essential (primary) hypertension: Secondary | ICD-10-CM | POA: Diagnosis not present

## 2015-11-28 DIAGNOSIS — I110 Hypertensive heart disease with heart failure: Secondary | ICD-10-CM | POA: Insufficient documentation

## 2015-11-28 DIAGNOSIS — R000 Tachycardia, unspecified: Secondary | ICD-10-CM | POA: Diagnosis not present

## 2015-11-28 DIAGNOSIS — I5022 Chronic systolic (congestive) heart failure: Secondary | ICD-10-CM | POA: Diagnosis not present

## 2015-11-28 DIAGNOSIS — I428 Other cardiomyopathies: Secondary | ICD-10-CM | POA: Diagnosis not present

## 2015-12-19 ENCOUNTER — Other Ambulatory Visit: Payer: Self-pay | Admitting: Family Medicine

## 2016-01-15 ENCOUNTER — Other Ambulatory Visit: Payer: Self-pay

## 2016-01-15 NOTE — Telephone Encounter (Signed)
Pt left v/m requesting rx hydrocodone apap. Call when ready for pick up. rx last printed # 60 on 10/04/15; pt last seen 06/05/15. No future appt scheduled.

## 2016-01-17 MED ORDER — HYDROCODONE-ACETAMINOPHEN 5-325 MG PO TABS: 1.0000 | ORAL_TABLET | Freq: Four times a day (QID) | ORAL | 0 refills | Status: DC | PRN

## 2016-01-17 NOTE — Telephone Encounter (Signed)
Zareen notified prescription is ready to be picked up at the front desk.

## 2016-01-25 ENCOUNTER — Other Ambulatory Visit: Payer: Self-pay | Admitting: Family Medicine

## 2016-02-13 ENCOUNTER — Other Ambulatory Visit: Payer: Self-pay | Admitting: Family Medicine

## 2016-02-13 NOTE — Telephone Encounter (Signed)
Last office visit 06/05/2015.  Last refilled 08/09/2015 for #30 with 5 refills. Ok to refill?

## 2016-03-08 ENCOUNTER — Encounter: Payer: Self-pay | Admitting: Family Medicine

## 2016-03-10 ENCOUNTER — Other Ambulatory Visit: Payer: Self-pay | Admitting: Family Medicine

## 2016-03-10 ENCOUNTER — Encounter: Payer: Self-pay | Admitting: Family Medicine

## 2016-03-10 MED ORDER — DOXYCYCLINE HYCLATE 100 MG PO TABS: 200.0000 mg | ORAL_TABLET | Freq: Once | ORAL | 0 refills | Status: AC

## 2016-03-11 ENCOUNTER — Encounter: Payer: Self-pay | Admitting: Primary Care

## 2016-03-11 ENCOUNTER — Ambulatory Visit (INDEPENDENT_AMBULATORY_CARE_PROVIDER_SITE_OTHER): Payer: 59 | Admitting: Primary Care

## 2016-03-11 VITALS — BP 124/82 | HR 90 | Temp 98.3°F | Ht 63.0 in | Wt 226.8 lb

## 2016-03-11 DIAGNOSIS — W57XXXA Bitten or stung by nonvenomous insect and other nonvenomous arthropods, initial encounter: Secondary | ICD-10-CM

## 2016-03-11 DIAGNOSIS — S40261A Insect bite (nonvenomous) of right shoulder, initial encounter: Secondary | ICD-10-CM | POA: Diagnosis not present

## 2016-03-11 NOTE — Progress Notes (Signed)
Subjective:    Patient ID: Kristine Curtis, female    DOB: 24-Sep-1960, 55 y.o.   MRN: [id number]  HPI  Ms. Kristine Curtis is a 55 year old female with a history of fibromyalgia and fatigue who presents today with a chief complaint of skin irritation. She found a tick on her right upper posterior shoulder Thursday night last week. She's experiencing symptoms of itching and tenderness to the surrounding tissue since. She woke up with chills, body aches, headache, sore throat and mild GI upset Saturday morning. These symptoms have improved overall but she does continue to experience fatigue. She's taken tylenol with improvement in symptoms. She is concerned about the possibility for Lyme's disease.  Review of Systems  Constitutional: Positive for chills and fatigue. Negative for fever.  HENT: Positive for sore throat. Negative for congestion.   Respiratory: Negative for cough.   Gastrointestinal: Negative for abdominal pain and nausea.  Skin: Positive for color change.  Neurological: Positive for headaches.       Past Medical History:  Diagnosis Date  . Allergic rhinitis due to pollen   . Asthma   . Chronic systolic congestive heart failure (HCC) 03/22/2015  . Depression   . Family history of polycystic kidney 11/16/2012   neg CT  . Fibromyalgia   . GERD (gastroesophageal reflux disease)   . Hypertension   . Hypothyroidism   . IBS (irritable bowel syndrome)   . Internal hemorrhoids   . Osteoarthritis   . PVC (premature ventricular contraction)   . Restless leg syndrome   . Sicca (HCC) 11/22/2014  . Vertigo      Social History   Social History  . Marital status: Married    Spouse name: N/A  . Number of children: 2  . Years of education: N/A   Occupational History  . quality assurance tech    Social History Main Topics  . Smoking status: Former Smoker    Packs/day: 2.00    Years: 12.00    Types: Cigarettes    Quit date: 05/06/1989  . Smokeless tobacco: Never Used   Comment: quit smoking 1990  . Alcohol use No  . Drug use: No  . Sexual activity: Not on file   Other Topics Concern  . Not on file   Social History Narrative   Former Dr. Andrey Campanile then Dr. 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, podiatrist       Past Surgical History:  Procedure Laterality Date  . ABDOMINAL HYSTERECTOMY    . CESAREAN SECTION    . CHOLECYSTECTOMY  10-2008  . COLONOSCOPY    . ESOPHAGOGASTRODUODENOSCOPY    . EYE SURGERY     2 2013 and 1981/DCR of right eye 05/2014  . LUMBAR DISC SURGERY  2016   L3-4  . SHOULDER ARTHROSCOPY W/ SUBACROMIAL DECOMPRESSION AND DISTAL CLAVICLE EXCISION Right    rotator cuff repair  . TUBAL LIGATION  1992    Family History  Problem Relation Age of Onset  . Pancreatic cancer Mother   . Diabetes Father   . Heart disease Father   . Glaucoma Father   . Kidney failure Brother     Allergies  Allergen Reactions  . Gabapentin (Once-Daily)     "side effect to severe"  . Codeine Itching  . Sulfonamide Derivatives Itching    Current Outpatient Prescriptions on File Prior to Visit  Medication Sig Dispense Refill  . buPROPion (WELLBUTRIN XL) 300 MG 24 hr tablet TAKE 1 TABLET BY MOUTH DAILY  30 tablet 2  . carvedilol (COREG) 25 MG tablet Take 1&1/2 tablets twice a day 90 tablet 6  . Cholecalciferol (VITAMIN D-3) 5000 UNITS TABS Take 1 capsule by mouth daily.    . cyclobenzaprine (FLEXERIL) 5 MG tablet Take 1 tablet by mouth 2 (two) times daily.    . diazepam (VALIUM) 5 MG tablet Take 5 mg by mouth every 6 (six) hours as needed for anxiety.    [name]. Docusate Sodium (COLACE PO) Take 1 tablet by mouth as needed (CONSTIPATION).     . furosemide (LASIX) 20 MG tablet Take 1 tablet (20 mg total) by mouth daily. 90 tablet 3  . glucose blood (ONE TOUCH ULTRA TEST) test strip Use to check blood sugar once daily. 100 each 3  . HYDROcodone-acetaminophen (NORCO/VICODIN) 5-325 MG tablet Take 1 tablet by mouth every 6 (six) hours as needed for moderate pain.  60 tablet 0  . hydroxychloroquine (PLAQUENIL) 200 MG tablet Take 200 mg by mouth 2 (two) times daily.     Marland Kitchen. levothyroxine (SYNTHROID, LEVOTHROID) 75 MCG tablet TAKE 1 TABLET BY MOUTH DAILY 30 tablet 5  . losartan (COZAAR) 100 MG tablet Take 1 tablet (100 mg total) by mouth daily. 30 tablet 11  . LOTEMAX 0.5 % GEL Place 1 drop into both eyes 2 (two) times daily.  0  . meloxicam (MOBIC) 7.5 MG tablet TAKE 1 TABLET BY MOUTH DAILY 30 tablet 5  . Multiple Vitamins-Minerals (ALIVE WOMENS GUMMY) CHEW Chew 2 tablets by mouth daily.     Marland Kitchen. omeprazole (PRILOSEC) 40 MG capsule Take 1 capsule (40 mg total) by mouth daily before breakfast. 30 capsule 11  . Wheat Dextrin (BENEFIBER) POWD Take 2 scoop by mouth daily as needed (for constipation).     Marland Kitchen. XIIDRA 5 % SOLN Place 1 drop into both eyes 2 (two) times daily.     No current facility-administered medications on file prior to visit.     BP 124/82   Pulse 90   Temp 98.3 F (36.8 C) (Oral)   Ht 5\' 3"  (1.6 m)   Wt 226 lb 12.8 oz (102.9 kg)   SpO2 98%   BMI 40.18 kg/m    Objective:   Physical Exam  Constitutional: She appears well-nourished. She does not appear ill.  HENT:  Right Ear: Tympanic membrane and ear canal normal.  Left Ear: Tympanic membrane and ear canal normal.  Nose: Right sinus exhibits no maxillary sinus tenderness and no frontal sinus tenderness. Left sinus exhibits no maxillary sinus tenderness and no frontal sinus tenderness.  Mouth/Throat: Oropharynx is clear and moist.  Eyes: Conjunctivae are normal.  Neck: Neck supple.  Cardiovascular: Normal rate and regular rhythm.   Pulmonary/Chest: Effort normal and breath sounds normal. She has no wheezes. She has no rales.  Lymphadenopathy:    She has no cervical adenopathy.  Skin: Skin is warm and dry.  Minor erythema to 1/4 cm circumferential bite. No rash. Appears to be healing well.          Assessment & Plan:  Tick Bite:  Located to right posterior shoulder since  Thursday last week. Does have systemic viral like symptoms which are likely coincidental. Exam today with well healing tick bite. No presence of erythema migrans, although this wouldn't show up this soon. Do not suspect Lyme's Disease at this point. No need for prophylactic treatment. Discussed s/s of Lyme's Disease with patient. Handout provided regarding warning signs. She will report any s/s within the next 2-3 weeks.  Sheral Flow, NP

## 2016-03-11 NOTE — Patient Instructions (Signed)
Please monitor your symptoms and notify me if you continue to experience/develop:  Headaches, fevers, increased joint pain, weakness, numbness, rash to the site of your tick bite.  You may apply hydrocortisone 1% cream twice daily to the site of the tick bite for any itching/irritation.  It was a pleasure meeting you!   Lyme Disease Lyme disease is an infection that affects many parts of the body, including the skin, joints, and nervous system. CAUSES Lyme disease is caused by bacteria called Borrelia burgdorferi. You can get Lyme disease by being bitten by an infected tick. The tick must be attached to your skin for at least 36 hours to transmit the infection. Deer often carry infected ticks. RISK FACTORS  Living in or visiting [place], the [place] [place] [address], or the [address] [place].  Spending time in wooded or grassy areas.  Being outdoors with exposed skin.  Failing to remove a tick from your skin within 3-4 days. SIGNS AND SYMPTOMS  A round, red rash that comes out from the center of the tick bite. This is the first sign of infection. The center of the rash may be blood colored or have tiny blisters.  Fatigue.  Headache.  Chills and fever.  General achiness.  Joint pain, often in the knee.  Swollen lymph glands. DIAGNOSIS Lyme disease is diagnosed with a medical history, physical exam, and blood test. TREATMENT The main treatment is antibiotic medicine, usually taken by mouth. The length of treatment depends on how soon after a tick bite you begin taking the medicine. In some cases, treatment is necessary for several weeks. If the infection is severe, IV antibiotics may be necessary. HOME CARE INSTRUCTIONS  Take your antibiotic medicine as directed by your health care provider. Finish the antibiotic even if you start to feel better.  You may take a probiotic in between doses of your antibiotic to help avoid stomach upset or diarrhea.  Check with your health care  provider before supplementing your treatment. Many alternative therapies have not been proven and may be harmful to you.  Keep all follow-up visits as directed by your health care provider. This is important. PREVENTION Reinfection is possible with another tick bite by an infected tick. Take these precautions to prevent an infection:  Cover your skin with light-colored clothing when outdoors in the spring and summer months.  Spray clothing and skin with bug spray. The spray should be 20-30% DEET.  Avoided wooded, grassy, and shaded areas.  Remove yard litter, brush, trash, and plants that attract deer and rodents.  Check yourself for ticks when you come indoors.  Wash clothing worn each day.  Check your pets for ticks before they come inside.  If you find a tick:  Remove it with tweezers.  Clean your hands and the bite area with rubbing alcohol or soap and water. Pregnant women should take special care to avoid tick bites because the infection can be passed along to the fetus. SEEK MEDICAL CARE IF:  You have symptoms after treatment.  You have removed a tick and want to bring it to your health care provider for testing. SEEK IMMEDIATE MEDICAL CARE IF:  You have an irregular heartbeat.  You have nerve pain.  Your face feels numb. MAKE SURE YOU:  Understand these instructions.  Will watch your condition.  Will get help right away if you are not doing well or get worse.   This information is not intended to replace advice given to you by your health care  provider. Make sure you discuss any questions you have with your health care provider.   Document Released: 07/29/2000 Document Revised: 05/13/2014 Document Reviewed: 09/07/2013 Elsevier Interactive Patient Education [employer]

## 2016-03-11 NOTE — Progress Notes (Signed)
Pre visit review using our clinic review tool, if applicable. No additional management support is needed unless otherwise documented below in the visit note. 

## 2016-03-11 NOTE — Telephone Encounter (Signed)
If she has systemic symptoms and flu-like symptoms, that is an entirely different scenario than what I thought.   You can do one time prophylaxis after removing a tick, but with systemic symptoms, I would keep f/u appt with Jae Dire today.

## 2016-03-19 ENCOUNTER — Telehealth: Payer: Self-pay | Admitting: Family Medicine

## 2016-03-19 ENCOUNTER — Other Ambulatory Visit: Payer: Self-pay | Admitting: Family Medicine

## 2016-03-19 DIAGNOSIS — R5383 Other fatigue: Secondary | ICD-10-CM

## 2016-03-19 DIAGNOSIS — M2550 Pain in unspecified joint: Secondary | ICD-10-CM

## 2016-03-19 DIAGNOSIS — E538 Deficiency of other specified B group vitamins: Secondary | ICD-10-CM

## 2016-03-19 DIAGNOSIS — R739 Hyperglycemia, unspecified: Secondary | ICD-10-CM

## 2016-03-19 DIAGNOSIS — W57XXXA Bitten or stung by nonvenomous insect and other nonvenomous arthropods, initial encounter: Secondary | ICD-10-CM

## 2016-03-19 DIAGNOSIS — E039 Hypothyroidism, unspecified: Secondary | ICD-10-CM

## 2016-03-19 DIAGNOSIS — E7849 Other hyperlipidemia: Secondary | ICD-10-CM

## 2016-03-19 DIAGNOSIS — E559 Vitamin D deficiency, unspecified: Secondary | ICD-10-CM

## 2016-03-19 NOTE — Telephone Encounter (Signed)
Pt called she has appointment tomorrow 11/15 for cpx labs and wanted to know if she could get lab work for lime diease she was here last week for tick bite.  She is having fatigue/chills/then hot and sweating.

## 2016-03-19 NOTE — Telephone Encounter (Signed)
I added lyme titer to future labs for this patient

## 2016-03-20 ENCOUNTER — Other Ambulatory Visit (INDEPENDENT_AMBULATORY_CARE_PROVIDER_SITE_OTHER): Payer: 59

## 2016-03-20 DIAGNOSIS — E7849 Other hyperlipidemia: Secondary | ICD-10-CM

## 2016-03-20 DIAGNOSIS — E559 Vitamin D deficiency, unspecified: Secondary | ICD-10-CM | POA: Diagnosis not present

## 2016-03-20 DIAGNOSIS — E039 Hypothyroidism, unspecified: Secondary | ICD-10-CM | POA: Diagnosis not present

## 2016-03-20 DIAGNOSIS — R5383 Other fatigue: Secondary | ICD-10-CM

## 2016-03-20 DIAGNOSIS — M2550 Pain in unspecified joint: Secondary | ICD-10-CM

## 2016-03-20 DIAGNOSIS — E538 Deficiency of other specified B group vitamins: Secondary | ICD-10-CM

## 2016-03-20 DIAGNOSIS — E784 Other hyperlipidemia: Secondary | ICD-10-CM

## 2016-03-20 DIAGNOSIS — R7989 Other specified abnormal findings of blood chemistry: Secondary | ICD-10-CM | POA: Diagnosis not present

## 2016-03-20 DIAGNOSIS — R739 Hyperglycemia, unspecified: Secondary | ICD-10-CM

## 2016-03-20 DIAGNOSIS — W57XXXA Bitten or stung by nonvenomous insect and other nonvenomous arthropods, initial encounter: Secondary | ICD-10-CM

## 2016-03-20 LAB — HEPATIC FUNCTION PANEL
ALT: 17 U/L (ref 0–35)
AST: 15 U/L (ref 0–37)
Albumin: 3.8 g/dL (ref 3.5–5.2)
Alkaline Phosphatase: 97 U/L (ref 39–117)
Bilirubin, Direct: 0.1 mg/dL (ref 0.0–0.3)
Total Bilirubin: 0.5 mg/dL (ref 0.2–1.2)
Total Protein: 5.9 g/dL — ABNORMAL LOW (ref 6.0–8.3)

## 2016-03-20 LAB — CBC WITH DIFFERENTIAL/PLATELET
Basophils Absolute: 0 10*3/uL (ref 0.0–0.1)
Basophils Relative: 0.6 % (ref 0.0–3.0)
Eosinophils Absolute: 0.2 10*3/uL (ref 0.0–0.7)
Eosinophils Relative: 4.3 % (ref 0.0–5.0)
HCT: 37.4 % (ref 36.0–46.0)
Hemoglobin: 12.9 g/dL (ref 12.0–15.0)
Lymphocytes Relative: 44.6 % (ref 12.0–46.0)
Lymphs Abs: 2.5 10*3/uL (ref 0.7–4.0)
MCHC: 34.5 g/dL (ref 30.0–36.0)
MCV: 90.3 fl (ref 78.0–100.0)
Monocytes Absolute: 0.3 10*3/uL (ref 0.1–1.0)
Monocytes Relative: 5.5 % (ref 3.0–12.0)
Neutro Abs: 2.6 10*3/uL (ref 1.4–7.7)
Neutrophils Relative %: 45 % (ref 43.0–77.0)
Platelets: 224 10*3/uL (ref 150.0–400.0)
RBC: 4.14 Mil/uL (ref 3.87–5.11)
RDW: 12.8 % (ref 11.5–15.5)
WBC: 5.7 10*3/uL (ref 4.0–10.5)

## 2016-03-20 LAB — T3, FREE: T3, Free: 3.2 pg/mL (ref 2.3–4.2)

## 2016-03-20 LAB — BASIC METABOLIC PANEL
BUN: 10 mg/dL (ref 6–23)
CO2: 28 mEq/L (ref 19–32)
Calcium: 9.1 mg/dL (ref 8.4–10.5)
Chloride: 107 mEq/L (ref 96–112)
Creatinine, Ser: 0.84 mg/dL (ref 0.40–1.20)
GFR: 74.52 mL/min (ref >60.00)
Glucose, Bld: 100 mg/dL — ABNORMAL HIGH (ref 70–99)
Potassium: 4 mEq/L (ref 3.5–5.1)
Sodium: 143 mEq/L (ref 135–145)

## 2016-03-20 LAB — LIPID PANEL
Cholesterol: 165 mg/dL (ref 0–200)
HDL: 37.2 mg/dL — ABNORMAL LOW (ref >39.00)
Total CHOL/HDL Ratio: 4
Triglycerides: 419 mg/dL — ABNORMAL HIGH (ref 0.0–149.0)

## 2016-03-20 LAB — TSH: TSH: 7.6 u[IU]/mL — ABNORMAL HIGH (ref 0.35–4.50)

## 2016-03-20 LAB — VITAMIN B12: Vitamin B-12: 510 pg/mL (ref 211–911)

## 2016-03-20 LAB — LDL CHOLESTEROL, DIRECT: Direct LDL: 75 mg/dL

## 2016-03-20 LAB — VITAMIN D 25 HYDROXY (VIT D DEFICIENCY, FRACTURES): VITD: 45.39 ng/mL (ref 30.00–100.00)

## 2016-03-20 LAB — T4, FREE: Free T4: 0.86 ng/dL (ref 0.60–1.60)

## 2016-03-20 LAB — HEMOGLOBIN A1C: Hgb A1c MFr Bld: 4.9 % (ref 4.6–6.5)

## 2016-03-21 LAB — LYME AB/WESTERN BLOT REFLEX: B burgdorferi Ab IgG+IgM: <0.9 Index (ref <0.90)

## 2016-03-22 ENCOUNTER — Ambulatory Visit (INDEPENDENT_AMBULATORY_CARE_PROVIDER_SITE_OTHER): Payer: 59 | Admitting: Family Medicine

## 2016-03-22 DIAGNOSIS — W57XXXD Bitten or stung by nonvenomous insect and other nonvenomous arthropods, subsequent encounter: Secondary | ICD-10-CM | POA: Diagnosis not present

## 2016-03-22 DIAGNOSIS — R5383 Other fatigue: Secondary | ICD-10-CM | POA: Diagnosis not present

## 2016-03-22 MED ORDER — DOXYCYCLINE HYCLATE 100 MG PO TABS: 100.0000 mg | ORAL_TABLET | Freq: Two times a day (BID) | ORAL | 0 refills | Status: DC

## 2016-03-22 NOTE — Progress Notes (Signed)
Cardiology Office Note   Date:  03/25/2016   ID:  Kristine Curtis, DOB 1961/03/25, MRN [id number]  PCP:  [name], MD  Cardiologist:   Gessica Jawad [place], MD   Chief Complaint  Patient presents with  . Follow-up    4 months  pt c/o occasional palpitations; dizziness upon standing; mild swelling--pt states has not been very bad; mild SOB   . Congestive Heart Failure      History of Present Illness: Kristine Curtis is a 55 y.o. female who is seen for follow up nonischemic cardiomyopathy. She was evaluated in over 10 years ago for a dilated cardiomyopathy and PVCs.  Coronary arteries were normal by cardiac cath.  She was seen by Dr. Ladona Ridgel for EP evaluation. No change in symptoms with beta blocker and essentially reassured.   Last year she presented with palpitations. She had evaluation with an Echo and Holter monitor. Holter showed rare PAC and PVC. Echo showed EF 30-35% with mild enlargement. Diffuse hypokinesis. Cardiac MRI showed EF 44% with global hypokinesis. No scar or infiltration.  She was started on Coreg and losartan. Medications adjusted. Repeat Echo in July 20217 showed EF 40-45%.   On follow up today she is doing well. No edema. No chest pain or dyspnea. The skipping in her heart rhythm has improved significantly. She has been spending a lot of time in [place] since her son-in-law has been very ill. As a result her diet has not been good. She is hoping to get back on a regular schedule.   Past Medical History:  Diagnosis Date  . Allergic rhinitis due to pollen   . Asthma   . Chronic systolic congestive heart failure (HCC) 03/22/2015  . Depression   . Family history of polycystic kidney 11/16/2012   neg CT  . Fibromyalgia   . GERD (gastroesophageal reflux disease)   . Hypertension   . Hypothyroidism   . IBS (irritable bowel syndrome)   . Internal hemorrhoids   . Osteoarthritis   . PVC (premature ventricular contraction)   . Restless leg syndrome   .  Sicca (HCC) 11/22/2014  . Vertigo     Past Surgical History:  Procedure Laterality Date  . ABDOMINAL HYSTERECTOMY    . CESAREAN SECTION    . CHOLECYSTECTOMY  10-2008  . COLONOSCOPY    . ESOPHAGOGASTRODUODENOSCOPY    . EYE SURGERY     2 2013 and 1981/DCR of right eye 05/2014  . LUMBAR DISC SURGERY  2016   L3-4  . SHOULDER ARTHROSCOPY W/ SUBACROMIAL DECOMPRESSION AND DISTAL CLAVICLE EXCISION Right    rotator cuff repair  . TUBAL LIGATION  1992     Current Outpatient Prescriptions  Medication Sig Dispense Refill  . buPROPion (WELLBUTRIN XL) 300 MG 24 hr tablet TAKE 1 TABLET BY MOUTH DAILY 30 tablet 2  . carvedilol (COREG) 25 MG tablet Take 1&1/2 tablets twice a day 90 tablet 6  . Cholecalciferol (VITAMIN D-3) 5000 UNITS TABS Take 1 capsule by mouth daily.    . cyclobenzaprine (FLEXERIL) 5 MG tablet Take 1 tablet by mouth 2 (two) times daily.    . diazepam (VALIUM) 5 MG tablet Take 5 mg by mouth every 6 (six) hours as needed for anxiety.    [name] Sodium (COLACE PO) Take 1 tablet by mouth as needed (CONSTIPATION).     Marland Kitchen doxycycline (VIBRA-TABS) 100 MG tablet Take 1 tablet (100 mg total) by mouth 2 (two) times daily. 20 tablet 0  . furosemide (  LASIX) 20 MG tablet Take 1 tablet (20 mg total) by mouth daily. 90 tablet 3  . glucose blood (ONE TOUCH ULTRA TEST) test strip Use to check blood sugar once daily. 100 each 3  . HYDROcodone-acetaminophen (NORCO/VICODIN) 5-325 MG tablet Take 1 tablet by mouth every 6 (six) hours as needed for moderate pain. 60 tablet 0  . hydroxychloroquine (PLAQUENIL) 200 MG tablet Take 200 mg by mouth 2 (two) times daily.     Marland Kitchen. levothyroxine (SYNTHROID, LEVOTHROID) 75 MCG tablet TAKE 1 TABLET BY MOUTH DAILY 30 tablet 5  . losartan (COZAAR) 100 MG tablet Take 1 tablet (100 mg total) by mouth daily. 30 tablet 11  . meloxicam (MOBIC) 7.5 MG tablet TAKE 1 TABLET BY MOUTH DAILY 30 tablet 5  . Multiple Vitamins-Minerals (ALIVE WOMENS GUMMY) CHEW Chew 2 tablets by  mouth daily.     Marland Kitchen. omeprazole (PRILOSEC) 40 MG capsule Take 1 capsule (40 mg total) by mouth daily before breakfast. 30 capsule 11  . Wheat Dextrin (BENEFIBER) POWD Take 2 scoop by mouth daily as needed (for constipation).     Marland Kitchen. XIIDRA 5 % SOLN Place 1 drop into both eyes 2 (two) times daily.     No current facility-administered medications for this visit.     Allergies:   Gabapentin (once-daily); Codeine; and Sulfonamide derivatives    Social History:  The patient  reports that she quit smoking about 26 years ago. Her smoking use included Cigarettes. She has a 24.00 pack-year smoking history. She has never used smokeless tobacco. She reports that she does not drink alcohol or use drugs.   Family History:  The patient's family history includes Diabetes in her father; Glaucoma in her father; Heart disease in her father; Kidney failure in her brother; Pancreatic cancer in her mother.    ROS:  Please see the history of present illness.   Otherwise, review of systems are positive for none.   All other systems are reviewed and negative.    PHYSICAL EXAM: VS:  BP 126/84 (BP Location: Left Arm, Patient Position: Sitting, Cuff Size: Normal)   Pulse 88   Ht 5' 2.5" (1.588 m)   Wt 227 lb 9.6 oz (103.2 kg)   BMI 40.97 kg/m  , BMI Body mass index is 40.97 kg/m. GEN: Well nourished, obese,  in no acute distress  HEENT: normal  Neck: no JVD, carotid bruits, or masses Cardiac: RRR; no murmurs, rubs, or gallops,no edema  Respiratory:  clear to auscultation bilaterally, normal work of breathing GI: soft, nontender, nondistended, + BS MS: no deformity or atrophy  Skin: warm and dry, no rash Neuro:  Strength and sensation are intact Psych: euthymic mood, full affect   EKG:  EKG is  Not ordered today.    Recent Labs: 08/11/2015: Brain Natriuretic Peptide <4.0 03/20/2016: ALT 17; BUN 10; Creatinine, Ser 0.84; Hemoglobin 12.9; Platelets 224.0; Potassium 4.0; Sodium 143; TSH 7.60    Lipid  Panel    Component Value Date/Time   CHOL 165 03/20/2016 0836   TRIG (H) 03/20/2016 0836    419.0 Triglyceride is over 400; calculations on Lipids are invalid.   HDL 37.20 (L) 03/20/2016 0836   CHOLHDL 4 03/20/2016 0836   VLDL 18.6 02/02/2015 0901   LDLCALC 85 02/02/2015 0901   LDLDIRECT 75.0 03/20/2016 0836      Wt Readings from Last 3 Encounters:  03/25/16 227 lb 9.6 oz (103.2 kg)  03/22/16 228 lb 8 oz (103.6 kg)  03/11/16 226 lb 12.8  oz (102.9 kg)      Other studies Reviewed: Additional studies/ records that were reviewed today include: Echo and MRI  Review of the above records demonstrates:  Echo 04/26/15:Study Conclusions  - Left ventricle: The cavity size was mildly dilated. Systolic function was moderately to severely reduced. The estimated ejection fraction was in the range of 30% to 35%. Moderate diffuse hypokinesis with no identifiable regional variations. There was fusion of early and atrial contributions to ventricular filling. The study is not technically sufficient to allow evaluation of LV diastolic function. No evidence of thrombus. - Mitral valve: There was mild regurgitation. - Left atrium: The atrium was mildly dilated.  CARDIAC MRI  TECHNIQUE: The patient was scanned on a 1.5 Tesla GE magnet. A dedicated cardiac coil was used. Functional imaging was done using Fiesta sequences. 2,3, and 4 chamber views were done to assess for RWMA's. Modified Simpson's rule using a short axis stack was used to calculate an ejection fraction on a dedicated work [employer]. The patient received 38 cc of Multihance. After 10 minutes inversion recovery sequences were used to assess for infiltration and scar tissue.  CONTRAST: 38 cc Multihance  FINDINGS: There was mild LAE. The RA/RV were normal in size and function. There was mild LVE. There was diffuse hypokinesis. The mitral aortic and tricuspid valves were structurally  normal  There was no pericardial effusion There was no ASD/VSD. The quantitative EF was 44% (EDV 110 cc ESV 61 cc SV 49 cc) Delayed gadolinium images showed no scar or infarct tissue  IMPRESSION: 1) Mild LVE with diffuse hypokinesis EF 44%  2) No scar tissue or infarct on delayed gadolinium images  3) Mild LAE  4) Normal RV  5) Normal Valves  6) No pericardial effusion  Charlton Haws   Electronically Signed  By: Charlton Haws M.D.  On: 03/17/2015 13:22  Echo dated 11/28/15: Study Conclusions  - Left ventricle: The cavity size was normal. Wall thickness was   normal. Systolic function was mildly to moderately reduced. The   estimated ejection fraction was in the range of 40% to 45%.   Diffuse hypokinesis. Doppler parameters are consistent with   abnormal left ventricular relaxation (grade 1 diastolic   dysfunction).  Impressions:  - EF is mildly improved when compared to prior (30%).   ASSESSMENT AND PLAN:  1.  Palpitations. Few PVCs and PACs on Holter. Continue beta blocker. Symptoms improved.   2. Nonischemic cardiomyopathy with chronic systolic CHF. Class 1. Echo initially showed EF 30-35%. By MRI EF 44%. MRI is more accurate given body habitus. Repeat Echo in July showed EF 40-45% more consistent with MRI. Continue Coreg and ARB.   Current medicines are reviewed at length with the patient today.  The patient does not have concerns regarding medicines.  The following changes have been made:  See above.  Labs/ tests ordered today include:  No orders of the defined types were placed in this encounter.    Disposition:   Follow up in 6 months  Signed, Dent Plantz [place], MD  03/25/2016 9:43 AM    [facility] [address] [place], [place], [address] Phone [phone], Fax [phone]

## 2016-03-22 NOTE — Progress Notes (Signed)
   Subjective:    Patient ID: Kristine Curtis, female    DOB: 11-Nov-1960, 55 y.o.   MRN: [id number]  HPI   55 year old female presents following tick bite 2 weeks ago. Not sure what type of tick it was.  Area is still itchy but healed.  Not sure how long it was in place.  No associated rash  No fever. Has been having chills off and on.  Has had some body aches. Has joint pain longterm, but worse.  Neck stiffness and headache.   She has noted swelling in left neck  In left neck.  Some tenderness in left axillae  Started doxy given on 11/5 to hold for progression of symptoms.   She has also noted  Worsened fatigue in last 2 weeks. She does have chronic fatigue with Sjogrens and fibromyalgia.  Lyme disease test neg on 11/15   Review of Systems  Constitutional: Positive for fatigue. Negative for fever.  HENT: Negative for ear pain.   Eyes: Negative for pain.  Respiratory: Negative for chest tightness and shortness of breath.   Cardiovascular: Negative for chest pain, palpitations and leg swelling.  Gastrointestinal: Negative for abdominal pain.  Genitourinary: Negative for dysuria.       Objective:   Physical Exam  Constitutional: Vital signs are normal. She appears well-developed and well-nourished. She is cooperative.  Non-toxic appearance. She does not appear ill. No distress.  HENT:  Head: Normocephalic.  Right Ear: Hearing, tympanic membrane, external ear and ear canal normal. Tympanic membrane is not erythematous, not retracted and not bulging.  Left Ear: Hearing, tympanic membrane, external ear and ear canal normal. Tympanic membrane is not erythematous, not retracted and not bulging.  Nose: No mucosal edema or rhinorrhea. Right sinus exhibits no maxillary sinus tenderness and no frontal sinus tenderness. Left sinus exhibits no maxillary sinus tenderness and no frontal sinus tenderness.  Mouth/Throat: Uvula is midline, oropharynx is clear and moist and mucous membranes  are normal.  Eyes: Conjunctivae, EOM and lids are normal. Pupils are equal, round, and reactive to light. Lids are everted and swept, no foreign bodies found.  Neck: Trachea normal and normal range of motion. Neck supple. Carotid bruit is not present. No thyroid mass and no thyromegaly present.  Cardiovascular: Normal rate, regular rhythm, S1 normal, S2 normal, normal heart sounds, intact distal pulses and normal pulses.  Exam reveals no gallop and no friction rub.   No murmur heard. Pulmonary/Chest: Effort normal and breath sounds normal. No tachypnea. No respiratory distress. She has no decreased breath sounds. She has no wheezes. She has no rhonchi. She has no rales.  Abdominal: Soft. Normal appearance and bowel sounds are normal. There is no tenderness.  Lymphadenopathy:    She has no cervical adenopathy.    She has no axillary adenopathy.  Neurological: She is alert.  Skin: Skin is warm, dry and intact. No rash noted.  No rash  Psychiatric: Her speech is normal and behavior is normal. Judgment and thought content normal. Her mood appears not anxious. Cognition and memory are normal. She does not exhibit a depressed mood.          Assessment & Plan:

## 2016-03-22 NOTE — Progress Notes (Signed)
Pre visit review using our clinic review tool, if applicable. No additional management support is needed unless otherwise documented below in the visit note. 

## 2016-03-22 NOTE — Patient Instructions (Signed)
Complete antibiotics.  Rest, fluids.  Follow up with PCP if not improving as expected.

## 2016-03-25 ENCOUNTER — Encounter: Payer: Self-pay | Admitting: Cardiology

## 2016-03-25 ENCOUNTER — Ambulatory Visit (INDEPENDENT_AMBULATORY_CARE_PROVIDER_SITE_OTHER): Payer: 59 | Admitting: Cardiology

## 2016-03-25 VITALS — BP 126/84 | HR 88 | Ht 62.5 in | Wt 227.6 lb

## 2016-03-25 DIAGNOSIS — I10 Essential (primary) hypertension: Secondary | ICD-10-CM

## 2016-03-25 DIAGNOSIS — I5022 Chronic systolic (congestive) heart failure: Secondary | ICD-10-CM

## 2016-03-25 NOTE — Patient Instructions (Signed)
Continue your current therapy  I will see you in 6  Months   

## 2016-03-27 ENCOUNTER — Ambulatory Visit (INDEPENDENT_AMBULATORY_CARE_PROVIDER_SITE_OTHER): Payer: 59 | Admitting: Family Medicine

## 2016-03-27 ENCOUNTER — Encounter: Payer: Self-pay | Admitting: Family Medicine

## 2016-03-27 VITALS — BP 124/84 | HR 86 | Temp 98.8°F | Ht 63.25 in | Wt 225.8 lb

## 2016-03-27 DIAGNOSIS — Z0000 Encounter for general adult medical examination without abnormal findings: Secondary | ICD-10-CM | POA: Diagnosis not present

## 2016-03-27 DIAGNOSIS — E032 Hypothyroidism due to medicaments and other exogenous substances: Secondary | ICD-10-CM | POA: Diagnosis not present

## 2016-03-27 NOTE — Progress Notes (Signed)
Dr. Frederico Hamman T. Maecy Podgurski, MD, [facility] [place], [address] Phone: [phone] Fax: [phone]  03/27/2016  Patient: Kristine Curtis, MRN: [id number], DOB: [birth year], 55 y.o.  Primary Physician:  Owens Loffler, MD   Chief Complaint  Patient presents with  . Annual Exam   Subjective:   Kristine Curtis is a 55 y.o. pleasant patient who presents with the following:  Health Maintenance Summary Reviewed and updated, unless pt declines services.  Tobacco History Reviewed. Non-smoker Alcohol: No concerns, no excessive use Exercise Habits: rare activity, rec at least 30 mins 5 times a week STD concerns: none Drug Use: None Birth control method: hyst Menses regular: yes Lumps or breast concerns: no Breast Cancer Family History: no  Laryngitis and has been feeling pretty bad and has been feeling fatigued.  Had a tick bite.  Still has tenderness and fatigue.  Now she is sweating and also having some cl chills.  Now on doxy  Dr. Cletis Media.   Health Maintenance  Topic Date Due  . INFLUENZA VACCINE  12/05/2015  . MAMMOGRAM  03/27/2018  . TETANUS/TDAP  02/07/2025  . COLONOSCOPY  02/13/2025  . Hepatitis C Screening  Completed  . HIV Screening  Completed    Immunization History  Administered Date(s) Administered  . Influenza,inj,Quad PF,36+ Mos 02/08/2015  . Tdap 02/08/2015   Patient Active Problem List   Diagnosis Date Noted  . Chronic low back pain with sciatica 05/02/2015  . Chronic systolic congestive heart failure ([place]) 03/22/2015  . Obesity (BMI 30-39.9) 03/22/2015  . Sicca ([place]) 11/22/2014  . Acquired nasolacrimal duct obstruction 04/13/2014  . Family history of polycystic kidney 11/16/2012  . UNSPECIFIED VITAMIN D DEFICIENCY 10/09/2009  . FATIGUE 05/22/2009  . Other and unspecified hyperlipidemia 11/15/2008  . Major depression in complete remission ([place]) 08/31/2008  . Essential hypertension  08/31/2008  . ALLERGIC RHINITIS 08/31/2008  . ASTHMA 08/31/2008  . GERD 08/31/2008  . Osteoarthritis 08/31/2008  . Hypothyroidism 08/30/2008  . Classical migraine without intractable migraine 08/30/2008  . Primary fibromyalgia syndrome 08/30/2008   Past Medical History:  Diagnosis Date  . Allergic rhinitis due to pollen   . Asthma   . Chronic systolic congestive heart failure ([place]) 03/22/2015  . Depression   . Family history of polycystic kidney 11/16/2012   neg CT  . Fibromyalgia   . GERD (gastroesophageal reflux disease)   . Hypertension   . Hypothyroidism   . IBS (irritable bowel syndrome)   . Internal hemorrhoids   . Osteoarthritis   . PVC (premature ventricular contraction)   . Restless leg syndrome   . Sicca ([place]) 11/22/2014  . Vertigo    Past Surgical History:  Procedure Laterality Date  . ABDOMINAL HYSTERECTOMY    . CESAREAN SECTION    . CHOLECYSTECTOMY  10-2008  . COLONOSCOPY    . ESOPHAGOGASTRODUODENOSCOPY    . EYE SURGERY     2 2013 and 1981/DCR of right eye 05/2014  . LUMBAR DISC SURGERY  2016   L3-4  . SHOULDER ARTHROSCOPY W/ SUBACROMIAL DECOMPRESSION AND DISTAL CLAVICLE EXCISION Right    rotator cuff repair  . TUBAL LIGATION  1992   Social History   Social History  . Marital status: Married    Spouse name: N/A  . Number of children: 2  . Years of education: N/A   Occupational History  . quality assurance tech    Social History Main Topics  . Smoking status:  Former Smoker    Packs/day: 2.00    Years: 12.00    Types: Cigarettes    Quit date: 05/06/1989  . Smokeless tobacco: Never Used     Comment: quit smoking 1990  . Alcohol use No  . Drug use: No  . Sexual activity: Not on file   Other Topics Concern  . Not on file   Social History Narrative   Former Dr. Redmond Pulling then Dr. Juventino Slovak patient    Dr. Rosemary Holms, podiatrist      Family History  Problem Relation Age of Onset  . Pancreatic cancer Mother   . Diabetes Father   . Heart  disease Father   . Glaucoma Father   . Kidney failure Brother    Allergies  Allergen Reactions  . Gabapentin (Once-Daily)     "side effect to severe"  . Codeine Itching  . Sulfonamide Derivatives Itching    Medication list has been reviewed and updated.   General: Illness for 2-3 weeks with sweats, chills, arthralgias, probably arthralgias and myalgias. She did have a recent tick bite. No bite.. Eyes: Denies blurring,significant itching ENT: Denies earache, sore throat, and hoarseness.  Cardiovascular: Denies chest pains, palpitations, dyspnea on exertion,  Respiratory: Denies cough, dyspnea at rest,wheeezing Breast: no concerns about lumps GI: Denies nausea, vomiting, diarrhea, constipation, change in bowel habits, abdominal pain, melena, hematochezia GU: Denies dysuria, hematuria, urinary hesitancy, nocturia, denies STD risk, no concerns about discharge Musculoskeletal: Polyarthralgia  Derm: Denies rash, itching Neuro: Denies  paresthesias, frequent falls, frequent headaches Psych: Denies depression, anxiety Endocrine: Denies cold intolerance, heat intolerance, polydipsia Heme: Denies enlarged lymph nodes Allergy: No hayfever  Objective:   BP 124/84   Pulse 86   Temp 98.8 F (37.1 C) (Oral)   Ht 5' 3.25" (1.607 m)   Wt 225 lb 12 oz (102.4 kg)   SpO2 98%   BMI 39.67 kg/m  No exam data present  GEN: well developed, well nourished, no acute distress Eyes: conjunctiva and lids normal, PERRLA, EOMI ENT: TM clear, nares clear, oral exam WNL Neck: supple, no lymphadenopathy, no thyromegaly, no JVD Pulm: clear to auscultation and percussion, respiratory effort normal CV: regular rate and rhythm, S1-S2, no murmur, rub or gallop, no bruits Chest: no scars, masses, no lumps BREAST: breast exam declined GI: soft, non-tender; no hepatosplenomegaly, masses; active bowel sounds all quadrants GU: GU exam declined Lymph: no cervical, axillary or inguinal adenopathy MSK: gait  normal, muscle tone and strength WNL, no joint swelling, effusions, discoloration, crepitus  SKIN: clear, good turgor, color WNL, no rashes, lesions, or ulcerations Neuro: normal mental status, normal strength, sensation, and motion Psych: alert; oriented to person, place and time, normally interactive and not anxious or depressed in appearance.   All labs reviewed with patient. Lipids:    Component Value Date/Time   CHOL 165 03/20/2016 0836   TRIG (H) 03/20/2016 0836    419.0 Triglyceride is over 400; calculations on Lipids are invalid.   HDL 37.20 (L) 03/20/2016 0836   LDLDIRECT 75.0 03/20/2016 0836   VLDL 18.6 02/02/2015 0901   CHOLHDL 4 03/20/2016 0836   CBC: CBC Latest Ref Rng & Units 03/20/2016 02/02/2015 11/04/2013  WBC 4.0 - 10.5 K/uL 5.7 12.6(H) 6.9  Hemoglobin 12.0 - 15.0 g/dL 12.9 13.3 13.4  Hematocrit 36.0 - 46.0 % 37.4 39.5 39.5  Platelets 150.0 - 400.0 K/uL 224.0 338.0 141.0    Basic Metabolic Panel:    Component Value Date/Time   NA 143 03/20/2016 0836  K 4.0 03/20/2016 0836   CL 107 03/20/2016 0836   CO2 28 03/20/2016 0836   BUN 10 03/20/2016 0836   CREATININE 0.84 03/20/2016 0836   CREATININE 0.78 08/18/2015 1045   GLUCOSE 100 (H) 03/20/2016 0836   CALCIUM 9.1 03/20/2016 0836   Hepatic Function Latest Ref Rng & Units 03/20/2016 02/02/2015 11/04/2013  Total Protein 6.0 - 8.3 g/dL 5.9(L) 6.6 6.8  Albumin 3.5 - 5.2 g/dL 3.8 4.0 3.7  AST 0 - 37 U/L '15 11 24  ' ALT 0 - 35 U/L '17 18 26  ' Alk Phosphatase 39 - 117 U/L 97 86 103  Total Bilirubin 0.2 - 1.2 mg/dL 0.5 0.5 0.9  Bilirubin, Direct 0.0 - 0.3 mg/dL 0.1 - 0.1    Lab Results  Component Value Date   TSH 7.60 (H) 03/20/2016   Assessment and Plan:   Health Maintenance Exam: The patient's preventative maintenance and recommended screening tests for an annual wellness exam were reviewed in full today. Brought up to date unless services declined.  Counselled on the importance of diet, exercise, and its role  in overall health and mortality. The patient's FH and SH was reviewed, including their home life, tobacco status, and drug and alcohol status.  Follow-up in 1 year for physical exam or additional follow-up below.  Healthcare maintenance  Hypothyroidism due to non-medication exogenous substances - Plan: TSH  Reassured regarding acute illness. Elevated TSH. May be secondary to acute illness. Recheck in 2 months.  Overall she is not feeling well in general. Some stress in life, difficulty with dealing her multiple medical comorbidities.  Follow-up: No Follow-up on file.  Orders Placed This Encounter  Procedures  . TSH    Signed,  Frederico Hamman T. Dynesha Woolen, MD     Medication List       Accurate as of 03/27/16  1:50 PM. Always use your most recent med list.          ALIVE WOMENS GUMMY Chew Chew 2 tablets by mouth daily.   BENEFIBER Powd Take 2 scoop by mouth daily as needed (for constipation).   buPROPion 300 MG 24 hr tablet Commonly known as:  WELLBUTRIN XL TAKE 1 TABLET BY MOUTH DAILY   carvedilol 25 MG tablet Commonly known as:  COREG Take 1&1/2 tablets twice a day   COLACE PO Take 1 tablet by mouth as needed (CONSTIPATION).   cyclobenzaprine 5 MG tablet Commonly known as:  FLEXERIL Take 1 tablet by mouth 2 (two) times daily.   diazepam 5 MG tablet Commonly known as:  VALIUM Take 5 mg by mouth every 6 (six) hours as needed for anxiety.   doxycycline 100 MG tablet Commonly known as:  VIBRA-TABS Take 1 tablet (100 mg total) by mouth 2 (two) times daily.   furosemide 20 MG tablet Commonly known as:  LASIX Take 1 tablet (20 mg total) by mouth daily.   glucose blood test strip Commonly known as:  ONE TOUCH ULTRA TEST Use to check blood sugar once daily.   HYDROcodone-acetaminophen 5-325 MG tablet Commonly known as:  NORCO/VICODIN Take 1 tablet by mouth every 6 (six) hours as needed for moderate pain.   hydroxychloroquine 200 MG tablet Commonly known as:   PLAQUENIL Take 200 mg by mouth 2 (two) times daily.   levothyroxine 75 MCG tablet Commonly known as:  SYNTHROID, LEVOTHROID TAKE 1 TABLET BY MOUTH DAILY   losartan 100 MG tablet Commonly known as:  COZAAR Take 1 tablet (100 mg total) by mouth daily.  meloxicam 7.5 MG tablet Commonly known as:  MOBIC TAKE 1 TABLET BY MOUTH DAILY   omeprazole 40 MG capsule Commonly known as:  PRILOSEC Take 1 capsule (40 mg total) by mouth daily before breakfast.   Vitamin D-3 5000 units Tabs Take 1 capsule by mouth daily.   XIIDRA 5 % Soln Generic drug:  Lifitegrast Place 1 drop into both eyes 2 (two) times daily.

## 2016-04-25 ENCOUNTER — Other Ambulatory Visit: Payer: Self-pay | Admitting: Internal Medicine

## 2016-04-25 ENCOUNTER — Other Ambulatory Visit: Payer: Self-pay | Admitting: Family Medicine

## 2016-05-02 ENCOUNTER — Other Ambulatory Visit: Payer: Self-pay

## 2016-05-02 MED ORDER — HYDROCODONE-ACETAMINOPHEN 5-325 MG PO TABS: 1.0000 | ORAL_TABLET | Freq: Four times a day (QID) | ORAL | 0 refills | Status: DC | PRN

## 2016-05-02 NOTE — Telephone Encounter (Signed)
Spoke to pt. Rx up front ready for pickup tomorrow.

## 2016-05-02 NOTE — Telephone Encounter (Signed)
Pt left v/m requesting rx hydrocodone apap. Call when ready for pick up. rx last printed # 60 on 01/17/16; last annual 03/27/16. Dr Patsy Lager out of office until next week and Dr Ermalene Searing also out of office.Please advise.

## 2016-05-09 ENCOUNTER — Encounter: Payer: Self-pay | Admitting: Primary Care

## 2016-05-09 ENCOUNTER — Ambulatory Visit (INDEPENDENT_AMBULATORY_CARE_PROVIDER_SITE_OTHER): Payer: 59 | Admitting: Primary Care

## 2016-05-09 VITALS — BP 122/78 | HR 86 | Temp 98.3°F | Ht 63.0 in | Wt 233.4 lb

## 2016-05-09 DIAGNOSIS — R350 Frequency of micturition: Secondary | ICD-10-CM | POA: Diagnosis not present

## 2016-05-09 LAB — POC URINALSYSI DIPSTICK (AUTOMATED)
Bilirubin, UA: NEGATIVE
Glucose, UA: NEGATIVE
Ketones, UA: NEGATIVE
Leukocytes, UA: NEGATIVE
Nitrite, UA: NEGATIVE
Spec Grav, UA: >=1.03
Urobilinogen, UA: NEGATIVE
pH, UA: 6

## 2016-05-09 MED ORDER — TAMSULOSIN HCL 0.4 MG PO CAPS: 0.4000 mg | ORAL_CAPSULE | Freq: Every day | ORAL | 0 refills | Status: DC

## 2016-05-09 NOTE — Progress Notes (Signed)
Subjective:    Patient ID: Kristine Curtis, female    DOB: June 14, 1960, 56 y.o.   MRN: [id number]  HPI  Ms. Kristine Curtis is a 56 year old female with a history of UTI who presents today with a chief complaint of urinary frequency. She also reports hematuria, flank pain, lower back pain, bilateral groin pain. Her symptoms began 2 weeks ago with frequency. She noticed hematuria yesterday. She denies fevers, but experienced chills and nausea all day yesterday. She's taken AZO 1 week ago with temporary improvement. She denies vaginal discharge, vaginal itching, abdominal pain, vomiting, palpitations.  Review of Systems  Constitutional: Negative for fatigue and fever.  Gastrointestinal: Negative for abdominal pain and nausea.  Genitourinary: Positive for flank pain, frequency, hematuria and pelvic pain. Negative for dysuria and vaginal discharge.       Past Medical History:  Diagnosis Date  . Allergic rhinitis due to pollen   . Asthma   . Chronic systolic congestive heart failure ([facility]) 03/22/2015  . Depression   . Family history of polycystic kidney 11/16/2012   neg CT  . Fibromyalgia   . GERD (gastroesophageal reflux disease)   . Hypertension   . Hypothyroidism   . IBS (irritable bowel syndrome)   . Internal hemorrhoids   . Osteoarthritis   . PVC (premature ventricular contraction)   . Restless leg syndrome   . Sicca ([facility]) 11/22/2014  . Vertigo      Social History   Social History  . Marital status: Married    Spouse name: N/A  . Number of children: 2  . Years of education: N/A   Occupational History  . quality assurance tech    Social History Main Topics  . Smoking status: Former Smoker    Packs/day: 2.00    Years: 12.00    Types: Cigarettes    Quit date: 05/06/1989  . Smokeless tobacco: Never Used     Comment: quit smoking 1990  . Alcohol use No  . Drug use: No  . Sexual activity: Not on file   Other Topics Concern  . Not on file   Social History Narrative   Former Dr. Andrey Campanile then Dr. 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, podiatrist       Past Surgical History:  Procedure Laterality Date  . ABDOMINAL HYSTERECTOMY    . CESAREAN SECTION    . CHOLECYSTECTOMY  10-2008  . COLONOSCOPY    . ESOPHAGOGASTRODUODENOSCOPY    . EYE SURGERY     2 2013 and 1981/DCR of right eye 05/2014  . LUMBAR DISC SURGERY  2016   L3-4  . SHOULDER ARTHROSCOPY W/ SUBACROMIAL DECOMPRESSION AND DISTAL CLAVICLE EXCISION Right    rotator cuff repair  . TUBAL LIGATION  1992    Family History  Problem Relation Age of Onset  . Pancreatic cancer Mother   . Diabetes Father   . Heart disease Father   . Glaucoma Father   . Kidney failure Brother     Allergies  Allergen Reactions  . Gabapentin (Once-Daily)     "side effect to severe"  . Codeine Itching  . Sulfonamide Derivatives Itching    Current Outpatient Prescriptions on File Prior to Visit  Medication Sig Dispense Refill  . buPROPion (WELLBUTRIN XL) 300 MG 24 hr tablet Take 1 tablet (300 mg total) by mouth daily. 30 tablet 11  . carvedilol (COREG) 25 MG tablet Take 1&1/2 tablets twice a day 90 tablet 6  . Cholecalciferol (VITAMIN D-3) 5000 UNITS  TABS Take 1 capsule by mouth daily.    . cyclobenzaprine (FLEXERIL) 5 MG tablet Take 1 tablet by mouth 2 (two) times daily.    . diazepam (VALIUM) 5 MG tablet Take 5 mg by mouth every 6 (six) hours as needed for anxiety.    [name] Sodium (COLACE PO) Take 1 tablet by mouth as needed (CONSTIPATION).     . furosemide (LASIX) 20 MG tablet Take 1 tablet (20 mg total) by mouth daily. 90 tablet 3  . glucose blood (ONE TOUCH ULTRA TEST) test strip Use to check blood sugar once daily. 100 each 3  . HYDROcodone-acetaminophen (NORCO/VICODIN) 5-325 MG tablet Take 1 tablet by mouth every 6 (six) hours as needed for moderate pain. 60 tablet 0  . hydroxychloroquine (PLAQUENIL) 200 MG tablet Take 200 mg by mouth 2 (two) times daily.     Marland Kitchen levothyroxine (SYNTHROID, LEVOTHROID) 75  MCG tablet TAKE 1 TABLET BY MOUTH DAILY 30 tablet 5  . losartan (COZAAR) 100 MG tablet Take 1 tablet (100 mg total) by mouth daily. 30 tablet 11  . meloxicam (MOBIC) 7.5 MG tablet TAKE 1 TABLET BY MOUTH DAILY 30 tablet 5  . Multiple Vitamins-Minerals (ALIVE WOMENS GUMMY) CHEW Chew 2 tablets by mouth daily.     Marland Kitchen omeprazole (PRILOSEC) 40 MG capsule TAKE ONE CAPSULE BY MOUTH DAILY BEFORE BREAKFAST. 30 capsule 1  . Wheat Dextrin (BENEFIBER) POWD Take 2 scoop by mouth daily as needed (for constipation).     Marland Kitchen XIIDRA 5 % SOLN Place 1 drop into both eyes 2 (two) times daily.     No current facility-administered medications on file prior to visit.     BP 122/78   Pulse 86   Temp 98.3 F (36.8 C) (Oral)   Ht 5\' 3"  (1.6 m)   Wt 233 lb 6.4 oz (105.9 kg)   SpO2 96%   BMI 41.34 kg/m    Objective:   Physical Exam  Constitutional: She appears well-nourished. No distress.  Neck: Neck supple.  Cardiovascular: Normal rate and regular rhythm.   Pulmonary/Chest: Effort normal and breath sounds normal.  Abdominal: Soft. Bowel sounds are normal. There is CVA tenderness.  Skin: Skin is warm and dry.          Assessment & Plan:  Urinary Frequency:  Also with groin pain, flank pain, and hematuria.  Exam today with mild bilateral CVA tenderness. Does not appear in distress. UA: Negative leuks, nitrites; 3+ blood, trace protein. Hysterectomy, no vaginal symptoms. Suspect symptoms related to renal stone. Rx for Flomax. She will use her hydrocodone PRN pain. Discussed to push intake of water and notify if no improvement in 1 week; notify sooner for fevers, increased pain, etc. She declines KUB today which is reasonable.  Return precautions provided.  Morrie Sheldon, NP

## 2016-05-09 NOTE — Patient Instructions (Signed)
Your symptoms are suspicious for a kidney stone.  Start Flomax capsules to help facilitate movement of your kidney stone. Ensure you are staying hydrated with water.  You may take Tylenol for mild pain and your Hydrocodone tablets for more moderate to severe pain. Do not take tylenol while taking your hydrocodone.  Please call me if you note fevers, increased pain, weakness, or no improvement by early next week.  It was a pleasure meeting you!

## 2016-05-09 NOTE — Progress Notes (Signed)
Pre visit review using our clinic review tool, if applicable. No additional management support is needed unless otherwise documented below in the visit note. 

## 2016-05-10 ENCOUNTER — Other Ambulatory Visit: Payer: Self-pay | Admitting: Family Medicine

## 2016-05-10 NOTE — Telephone Encounter (Signed)
Last office visit 05/09/2016 with Kristine Curtis for UTI. Last refilled 10/04/2015 for #30 with 3 refills.  Ok to refill?

## 2016-05-12 NOTE — Telephone Encounter (Signed)
Ok to refill #30, 3 refills

## 2016-05-13 NOTE — Telephone Encounter (Signed)
Diazepam called into [facility].

## 2016-05-23 DIAGNOSIS — W57XXXA Bitten or stung by nonvenomous insect and other nonvenomous arthropods, initial encounter: Secondary | ICD-10-CM | POA: Insufficient documentation

## 2016-05-23 NOTE — Assessment & Plan Note (Addendum)
Neg lyme testing. No current evidence of tick borne illness but will have her complete doxy course started before testing done.  Other symptoms likely dure to chronic disease.

## 2016-05-27 ENCOUNTER — Other Ambulatory Visit (INDEPENDENT_AMBULATORY_CARE_PROVIDER_SITE_OTHER): Payer: 59

## 2016-05-27 DIAGNOSIS — E032 Hypothyroidism due to medicaments and other exogenous substances: Secondary | ICD-10-CM

## 2016-05-27 LAB — TSH: TSH: 4.6 u[IU]/mL — ABNORMAL HIGH (ref 0.35–4.50)

## 2016-06-05 ENCOUNTER — Other Ambulatory Visit: Payer: Self-pay | Admitting: Primary Care

## 2016-06-05 DIAGNOSIS — R350 Frequency of micturition: Secondary | ICD-10-CM

## 2016-06-05 NOTE — Telephone Encounter (Signed)
Ok to refill? Electronically refill request for tamsulosin (FLOMAX) 0.4 MG CAPS capsule.  Last prescribed and seen on 05/09/2016.

## 2016-06-11 ENCOUNTER — Other Ambulatory Visit: Payer: Self-pay | Admitting: Family Medicine

## 2016-06-11 NOTE — Telephone Encounter (Signed)
Last TSH 4.60 on 05/27/2016.  Ok to refill?

## 2016-06-25 ENCOUNTER — Encounter (HOSPITAL_COMMUNITY): Payer: Self-pay

## 2016-06-25 ENCOUNTER — Emergency Department (HOSPITAL_COMMUNITY): Payer: 59

## 2016-06-25 DIAGNOSIS — M797 Fibromyalgia: Secondary | ICD-10-CM | POA: Diagnosis not present

## 2016-06-25 DIAGNOSIS — K648 Other hemorrhoids: Secondary | ICD-10-CM | POA: Diagnosis not present

## 2016-06-25 DIAGNOSIS — J45909 Unspecified asthma, uncomplicated: Secondary | ICD-10-CM | POA: Insufficient documentation

## 2016-06-25 DIAGNOSIS — I110 Hypertensive heart disease with heart failure: Secondary | ICD-10-CM | POA: Diagnosis not present

## 2016-06-25 DIAGNOSIS — M5440 Lumbago with sciatica, unspecified side: Secondary | ICD-10-CM | POA: Diagnosis not present

## 2016-06-25 DIAGNOSIS — R61 Generalized hyperhidrosis: Secondary | ICD-10-CM | POA: Insufficient documentation

## 2016-06-25 DIAGNOSIS — I700 Atherosclerosis of aorta: Secondary | ICD-10-CM | POA: Insufficient documentation

## 2016-06-25 DIAGNOSIS — G8929 Other chronic pain: Secondary | ICD-10-CM | POA: Diagnosis not present

## 2016-06-25 DIAGNOSIS — R0789 Other chest pain: Principal | ICD-10-CM | POA: Insufficient documentation

## 2016-06-25 DIAGNOSIS — Z8249 Family history of ischemic heart disease and other diseases of the circulatory system: Secondary | ICD-10-CM | POA: Insufficient documentation

## 2016-06-25 DIAGNOSIS — I209 Angina pectoris, unspecified: Secondary | ICD-10-CM | POA: Diagnosis present

## 2016-06-25 DIAGNOSIS — G2581 Restless legs syndrome: Secondary | ICD-10-CM | POA: Insufficient documentation

## 2016-06-25 DIAGNOSIS — Z833 Family history of diabetes mellitus: Secondary | ICD-10-CM | POA: Diagnosis not present

## 2016-06-25 DIAGNOSIS — F325 Major depressive disorder, single episode, in full remission: Secondary | ICD-10-CM | POA: Insufficient documentation

## 2016-06-25 DIAGNOSIS — Z882 Allergy status to sulfonamides status: Secondary | ICD-10-CM | POA: Insufficient documentation

## 2016-06-25 DIAGNOSIS — I5022 Chronic systolic (congestive) heart failure: Secondary | ICD-10-CM | POA: Diagnosis not present

## 2016-06-25 DIAGNOSIS — M1990 Unspecified osteoarthritis, unspecified site: Secondary | ICD-10-CM | POA: Insufficient documentation

## 2016-06-25 DIAGNOSIS — K5900 Constipation, unspecified: Secondary | ICD-10-CM | POA: Diagnosis not present

## 2016-06-25 DIAGNOSIS — I493 Ventricular premature depolarization: Secondary | ICD-10-CM | POA: Insufficient documentation

## 2016-06-25 DIAGNOSIS — E039 Hypothyroidism, unspecified: Secondary | ICD-10-CM | POA: Diagnosis not present

## 2016-06-25 DIAGNOSIS — I428 Other cardiomyopathies: Secondary | ICD-10-CM | POA: Diagnosis not present

## 2016-06-25 DIAGNOSIS — K589 Irritable bowel syndrome without diarrhea: Secondary | ICD-10-CM | POA: Diagnosis not present

## 2016-06-25 DIAGNOSIS — K219 Gastro-esophageal reflux disease without esophagitis: Secondary | ICD-10-CM | POA: Insufficient documentation

## 2016-06-25 DIAGNOSIS — Z87891 Personal history of nicotine dependence: Secondary | ICD-10-CM | POA: Insufficient documentation

## 2016-06-25 DIAGNOSIS — M3500 Sicca syndrome, unspecified: Secondary | ICD-10-CM | POA: Diagnosis not present

## 2016-06-25 LAB — CBC
HCT: 38.3 % (ref 36.0–46.0)
Hemoglobin: 12.9 g/dL (ref 12.0–15.0)
MCH: 30.9 pg (ref 26.0–34.0)
MCHC: 33.7 g/dL (ref 30.0–36.0)
MCV: 91.8 fL (ref 78.0–100.0)
Platelets: 246 10*3/uL (ref 150–400)
RBC: 4.17 MIL/uL (ref 3.87–5.11)
RDW: 13.1 % (ref 11.5–15.5)
WBC: 5.8 10*3/uL (ref 4.0–10.5)

## 2016-06-25 LAB — BASIC METABOLIC PANEL
Anion gap: 9 (ref 5–15)
BUN: 10 mg/dL (ref 6–20)
CO2: 24 mmol/L (ref 22–32)
Calcium: 9.7 mg/dL (ref 8.9–10.3)
Chloride: 107 mmol/L (ref 101–111)
Creatinine, Ser: 0.88 mg/dL (ref 0.44–1.00)
GFR calc Af Amer: >60 mL/min (ref >60)
GFR calc non Af Amer: >60 mL/min (ref >60)
Glucose, Bld: 123 mg/dL — ABNORMAL HIGH (ref 65–99)
Potassium: 3.8 mmol/L (ref 3.5–5.1)
Sodium: 140 mmol/L (ref 135–145)

## 2016-06-25 LAB — I-STAT TROPONIN, ED: Troponin i, poc: 0 ng/mL (ref 0.00–0.08)

## 2016-06-25 NOTE — ED Triage Notes (Signed)
Pt stated she started having dizziness at 7 pm yesterday pt stated she had onset of tightness in chest with weakness; pt states hx of smoking and HTN; pt a&ox 4 on arrival. Pt c/o pain at 7/10 on arrival.

## 2016-06-26 ENCOUNTER — Observation Stay (HOSPITAL_COMMUNITY)
Admission: EM | Admit: 2016-06-26 | Discharge: 2016-06-26 | Disposition: A | Discharge status: Home / Self Care | Payer: 59 | Attending: Internal Medicine | Admitting: Internal Medicine

## 2016-06-26 ENCOUNTER — Other Ambulatory Visit: Payer: Self-pay | Admitting: Physician Assistant

## 2016-06-26 DIAGNOSIS — R079 Chest pain, unspecified: Secondary | ICD-10-CM

## 2016-06-26 DIAGNOSIS — I428 Other cardiomyopathies: Secondary | ICD-10-CM | POA: Diagnosis present

## 2016-06-26 DIAGNOSIS — I259 Chronic ischemic heart disease, unspecified: Secondary | ICD-10-CM

## 2016-06-26 DIAGNOSIS — I10 Essential (primary) hypertension: Secondary | ICD-10-CM | POA: Diagnosis not present

## 2016-06-26 DIAGNOSIS — R9431 Abnormal electrocardiogram [ECG] [EKG]: Secondary | ICD-10-CM

## 2016-06-26 DIAGNOSIS — I5022 Chronic systolic (congestive) heart failure: Secondary | ICD-10-CM | POA: Diagnosis not present

## 2016-06-26 LAB — TROPONIN I
Troponin I: <0.03 ng/mL (ref <0.03)
Troponin I: <0.03 ng/mL (ref <0.03)
Troponin I: <0.03 ng/mL (ref <0.03)

## 2016-06-26 LAB — HIV ANTIBODY (ROUTINE TESTING W REFLEX): HIV Screen 4th Generation wRfx: NONREACTIVE

## 2016-06-26 MED ORDER — ACETAMINOPHEN 325 MG PO TABS: 650.0000 mg | ORAL_TABLET | ORAL | Status: DC | PRN

## 2016-06-26 MED ORDER — ONDANSETRON HCL 4 MG/2ML IJ SOLN: 4.0000 mg | Freq: Four times a day (QID) | INTRAMUSCULAR | Status: DC | PRN

## 2016-06-26 MED ORDER — HYDROXYCHLOROQUINE SULFATE 200 MG PO TABS
400.0000 mg | ORAL_TABLET | Freq: Every day | ORAL | Status: DC
  Administered 2016-06-26: 400 mg via ORAL
  Filled 2016-06-26: qty 2

## 2016-06-26 MED ORDER — PNEUMOCOCCAL VAC POLYVALENT 25 MCG/0.5ML IJ INJ: 0.5000 mL | INJECTION | INTRAMUSCULAR | Status: DC

## 2016-06-26 MED ORDER — ADULT MULTIVITAMIN W/MINERALS CH
1.0000 | ORAL_TABLET | Freq: Every day | ORAL | Status: DC
  Administered 2016-06-26: 1 via ORAL
  Filled 2016-06-26: qty 1

## 2016-06-26 MED ORDER — TRAMADOL HCL 50 MG PO TABS: 25.0000 mg | ORAL_TABLET | Freq: Four times a day (QID) | ORAL | 0 refills | Status: DC | PRN

## 2016-06-26 MED ORDER — KETOROLAC TROMETHAMINE 15 MG/ML IJ SOLN: 15.0000 mg | Freq: Once | INTRAMUSCULAR | Status: DC

## 2016-06-26 MED ORDER — ENOXAPARIN SODIUM 40 MG/0.4ML ~~LOC~~ SOLN
40.0000 mg | SUBCUTANEOUS | Status: DC
  Administered 2016-06-26: 40 mg via SUBCUTANEOUS
  Filled 2016-06-26: qty 0.4

## 2016-06-26 MED ORDER — PANTOPRAZOLE SODIUM 40 MG PO TBEC
80.0000 mg | DELAYED_RELEASE_TABLET | Freq: Every day | ORAL | Status: DC
  Administered 2016-06-26: 80 mg via ORAL
  Filled 2016-06-26: qty 2

## 2016-06-26 MED ORDER — LOSARTAN POTASSIUM 50 MG PO TABS
100.0000 mg | ORAL_TABLET | Freq: Every day | ORAL | Status: DC
  Administered 2016-06-26: 100 mg via ORAL
  Filled 2016-06-26 (×2): qty 2

## 2016-06-26 MED ORDER — ASPIRIN 325 MG PO TABS
325.0000 mg | ORAL_TABLET | Freq: Once | ORAL | Status: AC
  Administered 2016-06-26: 325 mg via ORAL
  Filled 2016-06-26: qty 1

## 2016-06-26 MED ORDER — VITAMIN D 1000 UNITS PO TABS
5000.0000 [IU] | ORAL_TABLET | Freq: Every day | ORAL | Status: DC
  Administered 2016-06-26: 5000 [IU] via ORAL
  Filled 2016-06-26: qty 5

## 2016-06-26 MED ORDER — BUPROPION HCL ER (XL) 300 MG PO TB24
300.0000 mg | ORAL_TABLET | Freq: Every day | ORAL | Status: DC
  Administered 2016-06-26: 300 mg via ORAL
  Filled 2016-06-26: qty 2

## 2016-06-26 MED ORDER — LIFITEGRAST 5 % OP SOLN: 1.0000 [drp] | Freq: Two times a day (BID) | OPHTHALMIC | Status: DC | PRN

## 2016-06-26 MED ORDER — KETOROLAC TROMETHAMINE 15 MG/ML IJ SOLN
15.0000 mg | Freq: Once | INTRAMUSCULAR | Status: DC
  Filled 2016-06-26: qty 1

## 2016-06-26 MED ORDER — HYDROCODONE-ACETAMINOPHEN 5-325 MG PO TABS
1.0000 | ORAL_TABLET | Freq: Four times a day (QID) | ORAL | Status: DC | PRN
  Administered 2016-06-26: 1 via ORAL
  Filled 2016-06-26: qty 1

## 2016-06-26 MED ORDER — LEVOTHYROXINE SODIUM 88 MCG PO TABS
88.0000 ug | ORAL_TABLET | Freq: Every day | ORAL | Status: DC
  Administered 2016-06-26: 88 ug via ORAL
  Filled 2016-06-26: qty 1

## 2016-06-26 MED ORDER — DIAZEPAM 5 MG PO TABS
5.0000 mg | ORAL_TABLET | Freq: Four times a day (QID) | ORAL | Status: DC | PRN
  Administered 2016-06-26: 5 mg via ORAL
  Filled 2016-06-26: qty 1

## 2016-06-26 MED ORDER — CYCLOBENZAPRINE HCL 10 MG PO TABS
5.0000 mg | ORAL_TABLET | Freq: Two times a day (BID) | ORAL | Status: DC
  Administered 2016-06-26: 5 mg via ORAL
  Filled 2016-06-26: qty 1

## 2016-06-26 MED ORDER — CARVEDILOL 25 MG PO TABS
37.5000 mg | ORAL_TABLET | Freq: Two times a day (BID) | ORAL | Status: DC
  Administered 2016-06-26: 37.5 mg via ORAL
  Filled 2016-06-26: qty 1
  Filled 2016-06-26: qty 3

## 2016-06-26 MED ORDER — ALIVE WOMENS GUMMY PO CHEW: 2.0000 | CHEWABLE_TABLET | Freq: Every day | ORAL | Status: DC

## 2016-06-26 MED ORDER — POLYVINYL ALCOHOL 1.4 % OP SOLN: 1.0000 [drp] | OPHTHALMIC | Status: DC | PRN

## 2016-06-26 MED ORDER — FUROSEMIDE 20 MG PO TABS
20.0000 mg | ORAL_TABLET | Freq: Every day | ORAL | Status: DC
  Administered 2016-06-26: 20 mg via ORAL
  Filled 2016-06-26: qty 1

## 2016-06-26 NOTE — Progress Notes (Signed)
   06/26/16 1350  Clinical Encounter Type  Visited With Patient  Visit Type Other (Comment) ([place] consult)  Spiritual Encounters  Spiritual Needs Emotional  Stress Factors  Patient Stress Factors None identified  Introduction to Pt. Provided and explained Advanced Directive. Pt will look over and discuss with family. Follow up next available time.

## 2016-06-26 NOTE — ED Notes (Signed)
Pt ambulated to restroom from room, tolerated well. 

## 2016-06-26 NOTE — Progress Notes (Signed)
Patient seen and examined this morning, admitted overnight by Dr. Julian Reil, H&P reviewed and agree the assessment and plan.  In brief, pleasant 56 year old female with history of nonischemic cardiomyopathy with chronic systolic CHF with EF of 40-45%, presents to the emergency room with chest pain on and off for a couple weeks.  This is associated with diaphoresis as well as shortness of breath.   Chest pain -Serial cardiac enzymes, cardiology consult, appreciate input  NICM with chronic systolic CHF -Currently appears euvolemic  Hypertension -Continue home medications   Easton Sivertson M. Elvera Lennox, MD [facility] [phone]  If 7PM-7AM, please contact night-coverage [email or web address] Password [facility]  06/14/2016, 5:37 AM

## 2016-06-26 NOTE — Progress Notes (Signed)
Bland Span to be D/C'd Home per MD order. Discussed with the patient and all questions fully answered.  Allergies as of 06/26/2016      Reactions   Gabapentin (once-daily)    "side effect to severe"   Codeine Itching   Sulfonamide Derivatives Itching      Medication List    TAKE these medications   ALIVE WOMENS GUMMY Chew Chew 2 tablets by mouth daily.   BENEFIBER Powd Take 2 scoop by mouth daily as needed (for constipation).   buPROPion 300 MG 24 hr tablet Commonly known as:  WELLBUTRIN XL Take 1 tablet (300 mg total) by mouth daily.   carvedilol 25 MG tablet Commonly known as:  COREG Take 1&1/2 tablets twice a day   COLACE PO Take 1 tablet by mouth as needed (CONSTIPATION).   cyclobenzaprine 5 MG tablet Commonly known as:  FLEXERIL Take 1 tablet by mouth 2 (two) times daily.   diazepam 5 MG tablet Commonly known as:  VALIUM TAKE ONE (1) TABLET BY MOUTH EVERY SIX HOURS AS NEEDED What changed:  See the new instructions.   furosemide 20 MG tablet Commonly known as:  LASIX Take 1 tablet (20 mg total) by mouth daily.   glucose blood test strip Commonly known as:  ONE TOUCH ULTRA TEST Use to check blood sugar once daily.   HYDROcodone-acetaminophen 5-325 MG tablet Commonly known as:  NORCO/VICODIN Take 1 tablet by mouth every 6 (six) hours as needed for moderate pain.   hydroxychloroquine 200 MG tablet Commonly known as:  PLAQUENIL Take 400 mg by mouth every morning.   levothyroxine 88 MCG tablet Commonly known as:  SYNTHROID, LEVOTHROID Take 1 tablet (88 mcg total) by mouth daily.   losartan 100 MG tablet Commonly known as:  COZAAR Take 1 tablet (100 mg total) by mouth daily.   meloxicam 7.5 MG tablet Commonly known as:  MOBIC TAKE 1 TABLET BY MOUTH DAILY   omeprazole 40 MG capsule Commonly known as:  PRILOSEC TAKE ONE CAPSULE BY MOUTH DAILY BEFORE BREAKFAST.   traMADol 50 MG tablet Commonly known as:  ULTRAM Take 0.5 tablets (25 mg total) by  mouth every 6 (six) hours as needed.   Vitamin D-3 5000 units Tabs Take 1 capsule by mouth daily.   XIIDRA 5 % Soln Generic drug:  Lifitegrast Place 1 drop into both eyes 2 (two) times daily as needed (dry eyes).       VVS, Skin clean, dry and intact without evidence of skin break down, no evidence of skin tears noted.  IV catheter discontinued intact. Site without signs and symptoms of complications. Dressing and pressure applied.  An After Visit Summary was printed and given to the patient.  Patient escorted via WC, and D/C home via private auto.  Kai Levins  06/26/2016 7:38 PM

## 2016-06-26 NOTE — Consult Note (Signed)
Cardiology Consult    Patient ID: Kristine Curtis MRN: [id number], DOB/AGE: Dec 11, 1960   Admit date: 06/26/2016 Date of Consult: 06/26/2016  Primary Physician: [name], MD Primary Cardiologist: Dr. Swaziland   Requesting Provider: Dr. Elvera Lennox  Reason for Consult: Chest pain  Patient Profile    Ms. Kristine Curtis is a 56 yo female with a PMH significant for HTN, chronic systolic heart failure, NICM, fibromyalgia, chronic fatigue syndrome, and Sjogren's presented to the [facility] with a  3-week history of chest pain, with the most recent episode of chest pain lasting longer than 24hr. Cardiology was consulted for evaluation of this chest pain.  Past Medical History   Past Medical History:  Diagnosis Date  . Allergic rhinitis due to pollen   . Asthma   . Chronic systolic congestive heart failure (HCC) 03/22/2015  . Depression   . Family history of polycystic kidney 11/16/2012   neg CT  . Fibromyalgia   . GERD (gastroesophageal reflux disease)   . Hypertension   . Hypothyroidism   . IBS (irritable bowel syndrome)   . Internal hemorrhoids   . Osteoarthritis   . PVC (premature ventricular contraction)   . Restless leg syndrome   . Sicca (HCC) 11/22/2014  . Vertigo     Past Surgical History:  Procedure Laterality Date  . ABDOMINAL HYSTERECTOMY    . CESAREAN SECTION    . CHOLECYSTECTOMY  10-2008  . COLONOSCOPY    . ESOPHAGOGASTRODUODENOSCOPY    . EYE SURGERY     2 2013 and 1981/DCR of right eye 05/2014  . LUMBAR DISC SURGERY  2016   L3-4  . SHOULDER ARTHROSCOPY W/ SUBACROMIAL DECOMPRESSION AND DISTAL CLAVICLE EXCISION Right    rotator cuff repair  . TUBAL LIGATION  1992     Allergies  Allergies  Allergen Reactions  . Gabapentin (Once-Daily)     "side effect to severe"  . Codeine Itching  . Sulfonamide Derivatives Itching    History of Present Illness    [name] is known to this service (Dr. Swaziland) and was last seen in clinic on 03/25/16. She underwent an  ischemic workup over 10 years ago; cardiac cath showed normal coronaries at that time. She reported palpitations in 2016; holter monitor revealed rare PAC and PVCs. Cardiac MRI showed EF 44% and global hypokinesis. Home medications were adjusted to include 37.5 mg coreg BID, 100 mg losartan daily, and 20 mg lasix daily.   She presented to the ED on 06/25/16 with 3-week history of chest pain.  She reports 2 episodes of chest pain in the past three weeks that woke her from sleep and lasted approximately 1-2 hrs. No precipitating or relieving factors. On 06/24/16, she reports bending down and getting lightheaded and dizzy when she stood up. This was followed by the same type of chest pain as in the past three weeks; the pain started in her mid chest, substernal and radiating to her left shoulder and down her left arm to her elbow. She reports a heaviness in her arms and legs. The chest pain was associated with diaphoresis, headache, nausea, no vomiting. She had a low grade fever on arrival to the ED, but she denies recent illness. This third and current episode of chest pain has been constant since 06/24/16 and has waxed and waned in intensity, but has not been relieved. She has not received nitro during this hospitalization. She reports swelling in her hands, but no edema in her LE.  The pain is not positional,  but is reproducible with palpation. She has a stroke in her family history, but no history of heart disease.   Troponin x 2 negative. EKG with flattened T waves in V3-6, nonspecific ST changes in Lead II and III. CXR without cardiopulm abnormalities.    Inpatient Medications    . buPROPion  300 mg Oral Daily  . carvedilol  37.5 mg Oral BID WC  . cholecalciferol  5,000 Units Oral Daily  . cyclobenzaprine  5 mg Oral BID  . enoxaparin (LOVENOX) injection  40 mg Subcutaneous Q24H  . furosemide  20 mg Oral Daily  . hydroxychloroquine  400 mg Oral Daily  . levothyroxine  88 mcg Oral QAC breakfast  .  losartan  100 mg Oral Daily  . multivitamin with minerals  1 tablet Oral Daily  . pantoprazole  80 mg Oral Daily  . [START ON 06/27/2016] pneumococcal 23 valent vaccine  0.5 mL Intramuscular Tomorrow-1000     Outpatient Medications    Prior to Admission medications   Medication Sig Start Date End Date Taking? Authorizing Provider  buPROPion (WELLBUTRIN XL) 300 MG 24 hr tablet Take 1 tablet (300 mg total) by mouth daily. 04/25/16  Yes [name], MD  carvedilol (COREG) 25 MG tablet Take 1&1/2 tablets twice a day 11/13/15  Yes Peter M Swaziland, MD  Cholecalciferol (VITAMIN D-3) 5000 UNITS TABS Take 1 capsule by mouth daily.   Yes Historical Provider, MD  cyclobenzaprine (FLEXERIL) 5 MG tablet Take 1 tablet by mouth 2 (two) times daily. 08/09/15  Yes Historical Provider, MD  diazepam (VALIUM) 5 MG tablet TAKE ONE (1) TABLET BY MOUTH EVERY SIX HOURS AS NEEDED Patient taking differently: TAKE ONE (1) TABLET BY MOUTH EVERY SIX HOURS AS NEEDED FOR ANXIETY 05/12/16  Yes Spencer Copland, MD  Docusate Sodium (COLACE PO) Take 1 tablet by mouth as needed (CONSTIPATION).    Yes Historical Provider, MD  furosemide (LASIX) 20 MG tablet Take 1 tablet (20 mg total) by mouth daily. 08/11/15  Yes Brittainy M Simmons, PA-C  glucose blood (ONE TOUCH ULTRA TEST) test strip Use to check blood sugar once daily. 11/09/14  Yes [name], MD  HYDROcodone-acetaminophen (NORCO/VICODIN) 5-325 MG tablet Take 1 tablet by mouth every 6 (six) hours as needed for moderate pain. 05/02/16  Yes Karie Schwalbe, MD  hydroxychloroquine (PLAQUENIL) 200 MG tablet Take 400 mg by mouth every morning.  06/07/15  Yes Historical Provider, MD  levothyroxine (SYNTHROID, LEVOTHROID) 88 MCG tablet Take 1 tablet (88 mcg total) by mouth daily. 06/11/16  Yes [name], MD  losartan (COZAAR) 100 MG tablet Take 1 tablet (100 mg total) by mouth daily. 10/12/15  Yes Brittainy Sherlynn Carbon, PA-C  meloxicam (MOBIC) 7.5 MG tablet TAKE 1 TABLET BY MOUTH  DAILY 02/14/16  Yes [name], MD  Multiple Vitamins-Minerals (ALIVE WOMENS GUMMY) CHEW Chew 2 tablets by mouth daily.    Yes Historical Provider, MD  omeprazole (PRILOSEC) 40 MG capsule TAKE ONE CAPSULE BY MOUTH DAILY BEFORE BREAKFAST. 04/25/16  Yes Iva Boop, MD  Wheat Dextrin [facility]) POWD Take 2 scoop by mouth daily as needed (for constipation).    Yes Historical Provider, MD  XIIDRA 5 % SOLN Place 1 drop into both eyes 2 (two) times daily as needed (dry eyes).  10/16/15  Yes Historical Provider, MD     Family History     Family History  Problem Relation Age of Onset  . Pancreatic cancer Mother   . Diabetes Father   .  Heart disease Father   . Glaucoma Father   . Kidney failure Brother     Social History    Social History   Social History  . Marital status: Married    Spouse name: N/A  . Number of children: 2  . Years of education: N/A   Occupational History  . quality assurance tech    Social History Main Topics  . Smoking status: Former Smoker    Packs/day: 2.00    Years: 12.00    Types: Cigarettes    Quit date: 05/06/1989  . Smokeless tobacco: Never Used     Comment: quit smoking 1990  . Alcohol use No  . Drug use: No  . Sexual activity: Not on file   Other Topics Concern  . Not on file   Social History Narrative   Former Dr. Andrey Campanile then Dr. 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  Cardiovascular:  No dyspnea on exertion, edema, orthopnea, palpitations, paroxysmal nocturnal dyspnea. Positive for chest pain/tightness Dermatological: No rash, lesions/masses Respiratory: No cough, dyspnea Urologic: No hematuria, dysuria Abdominal:   No nausea, vomiting, bright red blood per rectum, melena, or hematemesis; positive for diarrhea (IBS) Neurologic:  No visual changes, wkns, changes in mental status. All other systems reviewed and are otherwise  negative except as noted above.  Physical Exam    Blood pressure 134/69, pulse 82, temperature 97.9 F (36.6 C), temperature source Oral, resp. rate 20, weight 232 lb 6.4 oz (105.4 kg), SpO2 100 %.  General: Pleasant, NAD Psych: Normal affect. Neuro: Alert and oriented X 3. Moves all extremities spontaneously. HEENT: Normal  Neck: Supple without bruits or JVD. Lungs:  Resp regular and unlabored, CTA. Heart: RRR no s3, s4, or murmurs. Abdomen: Soft, non-tender, non-distended, BS + x 4.  Extremities: No clubbing, cyanosis or LE edema, trace edema in upper extremities. DP/PT/Radials 2+ and equal bilaterally.  Labs    Troponin [facility] of Care Test)  Recent Labs  06/25/16 2139  TROPIPOC 0.00    Recent Labs  06/26/16 0356 06/26/16 0946  TROPONINI <0.03 <0.03   Lab Results  Component Value Date   WBC 5.8 06/25/2016   HGB 12.9 06/25/2016   HCT 38.3 06/25/2016   MCV 91.8 06/25/2016   PLT 246 06/25/2016    Recent Labs Lab 06/25/16 2115  NA 140  K 3.8  CL 107  CO2 24  BUN 10  CREATININE 0.88  CALCIUM 9.7  GLUCOSE 123*   Lab Results  Component Value Date   CHOL 165 03/20/2016   HDL 37.20 (L) 03/20/2016   LDLCALC 85 02/02/2015   TRIG (H) 03/20/2016    419.0 Triglyceride is over 400; calculations on Lipids are invalid.   No results found for: [facility]   Radiology Studies    Dg Chest 2 View  Result Date: 06/25/2016 CLINICAL DATA:  56 y/o  F; chest pain. EXAM: CHEST  2 VIEW COMPARISON:  10/05/2008 chest radiograph FINDINGS: Stable heart size and mediastinal contours are within normal limits. Both lungs are clear. Aortic atherosclerosis with calcification. The visualized skeletal structures are unremarkable. Right upper quadrant cholecystectomy clips. IMPRESSION: No active cardiopulmonary disease. Electronically Signed   By: Mitzi Hansen M.D.   On: 06/25/2016 21:45    ECG & Cardiac Imaging    EKG 06/25/16: nonspecific ST changes in II/III, flattened T  waves in  V3-6; otherwise NSR   Echocardiogram 11/28/15: Study Conclusions - Left ventricle: The cavity size was normal. Wall thickness was   normal. Systolic function was mildly to moderately reduced. The   estimated ejection fraction was in the range of 40% to 45%.   Diffuse hypokinesis. Doppler parameters are consistent with   abnormal left ventricular relaxation (grade 1 diastolic   dysfunction). Impressions: - EF is mildly improved when compared to prior (30%).   Cardiac MRI 03/16/16: IMPRESSION: 1) Mild LVE with diffuse hypokinesis EF 44% 2) No scar tissue or infarct on delayed gadolinium images 3) Mild LAE 4) Normal RV 5) Normal Valves 6) No pericardial effusion   Holter Monitor 02/28/15:  Normal sinus rhythm  rare isolated PVC  rare isolted PAC   Assessment & Plan    1. Chest Pain - Troponin x 2 negative, EKG with nonspecific ST changes, CXR WNL - Her risk factors for CAD include HTN and autoimmune disease; she has high triglycerides, but LDL was 75 (03/20/16). Her chest pain is not pleuritic in nature and is not positional. Given her history and atypical chest pain symptoms, would recommend an ACS evaluation, but this can be done outpatient.  - per the patient, she routinely has elevated CRP and sed rate labs; will not order these at this time - Given her ongoing autoimmune disorders, will order limited echo to rule out pericardial effusion - with normal echo, OK for discharge and follow up for outpatient ischemia evaluation - will discuss with MD   2. NICM with chronic systolic CHF - most recent EF 40-98% - on exam, she is not volume overloaded - she has been compliant on her medications   3. HTN - She has been compliant on her home losartan and coreg - per the patient, pressures have been marginal at home - this admission: BP 90-140s/50-60s    Signed, Marcelino Duster, PA-C 06/26/2016, 2:06 PM   I have seen and examined the patient along with  Marcelino Duster, PA-C.  I have reviewed the chart, notes and new data.  I agree with PA's note.  Key new complaints: chest pain sd is new and different from presenting symptoms at diagnosis of NICM, but is not typical for either angina or pericarditis. Key examination changes: no pericardial rub, normal CV exam Key new findings / data: patient states her inflammatory markers are "always high" due to Sjogren's sd. Low risk troponin. ECG has subtle abnormalities, but is unchanged  From previous tracings.  PLAN: Low risk for major adverse cardiac events in the near future. The etiology of her symptoms is unclear.  Give one dose of NSAID (Toradol) for diagnostic purposes (she has been told to generally avoid using NSAIDs by Dr. Swaziland, probably due to volume retention effect). Will schedule for limited echo for pericardial effusion and Lexiscan Myoview (unable to exercise due to musculoskeletal problems) before the end of this week and then arrange follow up.  Thurmon Fair, MD, [facility] [phone] 06/26/2016, 5:02 PM

## 2016-06-26 NOTE — ED Notes (Signed)
Cardiology at bedside.

## 2016-06-26 NOTE — H&P (Signed)
History and Physical    Kristine Curtis ZOX:[id number] DOB: 1961/01/23 DOA: 06/26/2016   PCP: [name], MD Chief Complaint:  Chief Complaint  Patient presents with  . Chest Pain    HPI: Kristine Curtis is a 56 y.o. female with medical history significant of NICM with EF of 40-45% on most recent echo (July 2017), followed by Dr. Swaziland.  Patient presents to the ED with c/o chest pain.  Symptoms initially started 3 weeks ago.  "squeezing" sensation, located in sternum and L chest with pain in L neck and L axilla.  Symptoms resolved however reoccured 24 hours ago which has persisted.  Associated dyspnea, diaphoresis, nausea which was ongoing throughout the day yesterday.  Associated generalized weakness for past 3 weeks.  Presented to ED for persistent symptoms.  No recent travel.  ED Course: Trop neg, EKG unchanged.  Review of Systems: As per HPI otherwise 10 point review of systems negative.    Past Medical History:  Diagnosis Date  . Allergic rhinitis due to pollen   . Asthma   . Chronic systolic congestive heart failure (HCC) 03/22/2015  . Depression   . Family history of polycystic kidney 11/16/2012   neg CT  . Fibromyalgia   . GERD (gastroesophageal reflux disease)   . Hypertension   . Hypothyroidism   . IBS (irritable bowel syndrome)   . Internal hemorrhoids   . Osteoarthritis   . PVC (premature ventricular contraction)   . Restless leg syndrome   . Sicca (HCC) 11/22/2014  . Vertigo     Past Surgical History:  Procedure Laterality Date  . ABDOMINAL HYSTERECTOMY    . CESAREAN SECTION    . CHOLECYSTECTOMY  10-2008  . COLONOSCOPY    . ESOPHAGOGASTRODUODENOSCOPY    . EYE SURGERY     2 2013 and 1981/DCR of right eye 05/2014  . LUMBAR DISC SURGERY  2016   L3-4  . SHOULDER ARTHROSCOPY W/ SUBACROMIAL DECOMPRESSION AND DISTAL CLAVICLE EXCISION Right    rotator cuff repair  . TUBAL LIGATION  1992     reports that she quit smoking about 27 years ago. Her  smoking use included Cigarettes. She has a 24.00 pack-year smoking history. She has never used smokeless tobacco. She reports that she does not drink alcohol or use drugs.  Allergies  Allergen Reactions  . Gabapentin (Once-Daily)     "side effect to severe"  . Codeine Itching  . Sulfonamide Derivatives Itching    Family History  Problem Relation Age of Onset  . Pancreatic cancer Mother   . Diabetes Father   . Heart disease Father   . Glaucoma Father   . Kidney failure Brother       Prior to Admission medications   Medication Sig Start Date End Date Taking? Authorizing Provider  buPROPion (WELLBUTRIN XL) 300 MG 24 hr tablet Take 1 tablet (300 mg total) by mouth daily. 04/25/16  Yes [name], MD  carvedilol (COREG) 25 MG tablet Take 1&1/2 tablets twice a day 11/13/15  Yes Peter M Swaziland, MD  Cholecalciferol (VITAMIN D-3) 5000 UNITS TABS Take 1 capsule by mouth daily.   Yes Historical Provider, MD  cyclobenzaprine (FLEXERIL) 5 MG tablet Take 1 tablet by mouth 2 (two) times daily. 08/09/15  Yes Historical Provider, MD  diazepam (VALIUM) 5 MG tablet TAKE ONE (1) TABLET BY MOUTH EVERY SIX HOURS AS NEEDED Patient taking differently: TAKE ONE (1) TABLET BY MOUTH EVERY SIX HOURS AS NEEDED FOR ANXIETY 05/12/16  Yes Karleen Hampshire  Copland, MD  Docusate Sodium (COLACE PO) Take 1 tablet by mouth as needed (CONSTIPATION).    Yes Historical Provider, MD  furosemide (LASIX) 20 MG tablet Take 1 tablet (20 mg total) by mouth daily. 08/11/15  Yes Brittainy M Simmons, PA-C  glucose blood (ONE TOUCH ULTRA TEST) test strip Use to check blood sugar once daily. 11/09/14  Yes [name], MD  HYDROcodone-acetaminophen (NORCO/VICODIN) 5-325 MG tablet Take 1 tablet by mouth every 6 (six) hours as needed for moderate pain. 05/02/16  Yes Karie Schwalbe, MD  hydroxychloroquine (PLAQUENIL) 200 MG tablet Take 400 mg by mouth every morning.  06/07/15  Yes Historical Provider, MD  levothyroxine (SYNTHROID, LEVOTHROID) 88  MCG tablet Take 1 tablet (88 mcg total) by mouth daily. 06/11/16  Yes [name], MD  losartan (COZAAR) 100 MG tablet Take 1 tablet (100 mg total) by mouth daily. 10/12/15  Yes Brittainy Sherlynn Carbon, PA-C  meloxicam (MOBIC) 7.5 MG tablet TAKE 1 TABLET BY MOUTH DAILY 02/14/16  Yes [name], MD  Multiple Vitamins-Minerals (ALIVE WOMENS GUMMY) CHEW Chew 2 tablets by mouth daily.    Yes Historical Provider, MD  omeprazole (PRILOSEC) 40 MG capsule TAKE ONE CAPSULE BY MOUTH DAILY BEFORE BREAKFAST. 04/25/16  Yes Iva Boop, MD  Wheat Dextrin [facility]) POWD Take 2 scoop by mouth daily as needed (for constipation).    Yes Historical Provider, MD  XIIDRA 5 % SOLN Place 1 drop into both eyes 2 (two) times daily as needed (dry eyes).  10/16/15  Yes Historical Provider, MD    Physical Exam: Vitals:   06/26/16 0215 06/26/16 0230 06/26/16 0245 06/26/16 0400  BP: 133/88 136/76 140/64 131/59  Pulse: 93 93 91 94  Resp: 18 (!) 29 21 11   Temp:      TempSrc:      SpO2: 99% 100% 99% 99%      Constitutional: NAD, calm, comfortable Eyes: PERRL, lids and conjunctivae normal ENMT: Mucous membranes are moist. Posterior pharynx clear of any exudate or lesions.Normal dentition.  Neck: normal, supple, no masses, no thyromegaly Respiratory: clear to auscultation bilaterally, no wheezing, no crackles. Normal respiratory effort. No accessory muscle use.  Cardiovascular: Regular rate and rhythm, no murmurs / rubs / gallops. No extremity edema. 2+ pedal pulses. No carotid bruits.  Abdomen: no tenderness, no masses palpated. No hepatosplenomegaly. Bowel sounds positive.  Musculoskeletal: no clubbing / cyanosis. No joint deformity upper and lower extremities. Good ROM, no contractures. Normal muscle tone.  Skin: no rashes, lesions, ulcers. No induration Neurologic: CN 2-12 grossly intact. Sensation intact, DTR normal. Strength 5/5 in all 4.  Psychiatric: Normal judgment and insight. Alert and oriented x 3.  Normal mood.    Labs on Admission: I have personally reviewed following labs and imaging studies  CBC:  Recent Labs Lab 06/25/16 2115  WBC 5.8  HGB 12.9  HCT 38.3  MCV 91.8  PLT 246   Basic Metabolic Panel:  Recent Labs Lab 06/25/16 2115  NA 140  K 3.8  CL 107  CO2 24  GLUCOSE 123*  BUN 10  CREATININE 0.88  CALCIUM 9.7   GFR: CrCl cannot be calculated (Unknown ideal weight.). Liver Function Tests: No results for input(s): AST, ALT, ALKPHOS, BILITOT, PROT, ALBUMIN in the last 168 hours. No results for input(s): LIPASE, AMYLASE in the last 168 hours. No results for input(s): AMMONIA in the last 168 hours. Coagulation Profile: No results for input(s): INR, PROTIME in the last 168 hours. Cardiac Enzymes: No results for  input(s): CKTOTAL, CKMB, CKMBINDEX, TROPONINI in the last 168 hours. BNP (last 3 results) No results for input(s): PROBNP in the last 8760 hours. HbA1C: No results for input(s): HGBA1C in the last 72 hours. CBG: No results for input(s): GLUCAP in the last 168 hours. Lipid Profile: No results for input(s): CHOL, HDL, LDLCALC, TRIG, CHOLHDL, LDLDIRECT in the last 72 hours. Thyroid Function Tests: No results for input(s): TSH, T4TOTAL, FREET4, T3FREE, THYROIDAB in the last 72 hours. Anemia Panel: No results for input(s): VITAMINB12, FOLATE, FERRITIN, TIBC, IRON, RETICCTPCT in the last 72 hours. Urine analysis:    Component Value Date/Time   COLORURINE YELLOW 10/05/2008 0754   APPEARANCEUR CLOUDY (A) 10/05/2008 0754   LABSPEC 1.020 10/05/2008 0754   PHURINE 6.0 10/05/2008 0754   GLUCOSEU NEGATIVE 10/05/2008 0754   HGBUR SMALL (A) 10/05/2008 0754   BILIRUBINUR negative 05/09/2016 0948   KETONESUR 15 (A) 10/05/2008 0754   PROTEINUR +- 05/09/2016 0948   PROTEINUR NEGATIVE 10/05/2008 0754   UROBILINOGEN negative 05/09/2016 0948   UROBILINOGEN 0.2 10/05/2008 0754   NITRITE negative 05/09/2016 0948   NITRITE NEGATIVE 10/05/2008 0754   LEUKOCYTESUR  Negative 05/09/2016 0948   Sepsis Labs: @LABRCNTIP (procalcitonin:4,lacticidven:4) )No results found for this or any previous visit (from the past 240 hour(s)).   Radiological Exams on Admission: Dg Chest 2 View  Result Date: 06/25/2016 CLINICAL DATA:  56 y/o  F; chest pain. EXAM: CHEST  2 VIEW COMPARISON:  10/05/2008 chest radiograph FINDINGS: Stable heart size and mediastinal contours are within normal limits. Both lungs are clear. Aortic atherosclerosis with calcification. The visualized skeletal structures are unremarkable. Right upper quadrant cholecystectomy clips. IMPRESSION: No active cardiopulmonary disease. Electronically Signed   By: Mitzi Hansen M.D.   On: 06/25/2016 21:45    EKG: Independently reviewed.  Assessment/Plan Active Problems:   Essential hypertension   Chronic systolic congestive heart failure (HCC)   Chest pain, rule out acute myocardial infarction    1. CP ruleout - 1. CP obs pathway 2. Serial trops 3. HEART score of 4 4. Tele monitor 5. NPO 6. Call cards in AM 2. NICM - 1. EF 40-45% 2. Followed by Dr. Swaziland 3. Continue home meds 3. HTN - continue home meds   DVT prophylaxis: Lovenox Code Status: Full Family Communication: No family in room Consults called: None Admission status: Admit to obs   GARDNER, Heywood Iles DO [facility] Pager [phone] from 7PM-7AM  If 7AM-7PM, please contact the day physician for the patient [email or web address] Password [facility]  06/26/2016, 4:24 AM

## 2016-06-26 NOTE — ED Notes (Signed)
Attempted report x1. 

## 2016-06-26 NOTE — ED Notes (Signed)
Pt reports her EF is below 30%.  Is compliant w/ medication

## 2016-06-26 NOTE — ED Provider Notes (Signed)
[facility]-EMERGENCY DEPT Provider Note   CSN: [id number] Arrival date & time: 06/25/16  2048  By signing my name below, I, Elder Negus, attest that this documentation has been prepared under the direction and in the presence of Tomasita Crumble, MD. Electronically Signed: Elder Negus, Scribe. 06/26/16. 1:11 AM.   History   Chief Complaint Chief Complaint  Patient presents with  . Chest Pain    HPI Kristine Curtis is a 56 y.o. female with history of nonischemic cardiomyopathy, HTN, hypothyroidism, and CHF who presents to the ED for evaluation of chest pain. This patient states that 3 weeks ago she first developed "squeezing" in her sternum and L chest with pain in the L neck and L axilla. These symptoms resolved. However 24 hours ago she had recurrent pain which has persisted. At interview, she is also reporting dyspnea, diaphoresis, and nausea which has been ongoing throughout the day. She is also reporting ongoing fatigue and global weakness over the last 3 weeks. She denies any particular modifying factors associated with her chest pain. No recent long distance travel.   The history is provided by the patient. No language interpreter was used.    Past Medical History:  Diagnosis Date  . Allergic rhinitis due to pollen   . Asthma   . Chronic systolic congestive heart failure (HCC) 03/22/2015  . Depression   . Family history of polycystic kidney 11/16/2012   neg CT  . Fibromyalgia   . GERD (gastroesophageal reflux disease)   . Hypertension   . Hypothyroidism   . IBS (irritable bowel syndrome)   . Internal hemorrhoids   . Osteoarthritis   . PVC (premature ventricular contraction)   . Restless leg syndrome   . Sicca (HCC) 11/22/2014  . Vertigo     Patient Active Problem List   Diagnosis Date Noted  . Tick bite 05/23/2016  . Chronic low back pain with sciatica 05/02/2015  . Chronic systolic congestive heart failure (HCC) 03/22/2015  . Obesity (BMI 30-39.9) 03/22/2015  .  Sicca (HCC) 11/22/2014  . Family history of polycystic kidney 11/16/2012  . UNSPECIFIED VITAMIN D DEFICIENCY 10/09/2009  . Other and unspecified hyperlipidemia 11/15/2008  . Major depression in complete remission (HCC) 08/31/2008  . Essential hypertension 08/31/2008  . ALLERGIC RHINITIS 08/31/2008  . ASTHMA 08/31/2008  . GERD 08/31/2008  . Osteoarthritis 08/31/2008  . Hypothyroidism 08/30/2008  . Classical migraine without intractable migraine 08/30/2008  . Primary fibromyalgia syndrome 08/30/2008    Past Surgical History:  Procedure Laterality Date  . ABDOMINAL HYSTERECTOMY    . CESAREAN SECTION    . CHOLECYSTECTOMY  10-2008  . COLONOSCOPY    . ESOPHAGOGASTRODUODENOSCOPY    . EYE SURGERY     2 2013 and 1981/DCR of right eye 05/2014  . LUMBAR DISC SURGERY  2016   L3-4  . SHOULDER ARTHROSCOPY W/ SUBACROMIAL DECOMPRESSION AND DISTAL CLAVICLE EXCISION Right    rotator cuff repair  . TUBAL LIGATION  1992    OB History    No data available       Home Medications    Prior to Admission medications   Medication Sig Start Date End Date Taking? Authorizing Provider  buPROPion (WELLBUTRIN XL) 300 MG 24 hr tablet Take 1 tablet (300 mg total) by mouth daily. 04/25/16   [name], MD  carvedilol (COREG) 25 MG tablet Take 1&1/2 tablets twice a day 11/13/15   Peter M Sw[place], MD  Cholecalciferol (VITAMIN D-3) 5000 UNITS TABS Take 1 capsule by mouth daily.  Historical Provider, MD  cyclobenzaprine (FLEXERIL) 5 MG tablet Take 1 tablet by mouth 2 (two) times daily. 08/09/15   Historical Provider, MD  diazepam (VALIUM) 5 MG tablet TAKE ONE (1) TABLET BY MOUTH EVERY SIX HOURS AS NEEDED 05/12/16   [name], MD  Docusate Sodium (COLACE PO) Take 1 tablet by mouth as needed (CONSTIPATION).     Historical Provider, MD  furosemide (LASIX) 20 MG tablet Take 1 tablet (20 mg total) by mouth daily. 08/11/15   Brittainy M Simmons, PA-C  glucose blood (ONE TOUCH ULTRA TEST) test strip Use to  check blood sugar once daily. 11/09/14   [name], MD  HYDROcodone-acetaminophen (NORCO/VICODIN) 5-325 MG tablet Take 1 tablet by mouth every 6 (six) hours as needed for moderate pain. 05/02/16   Karie Schwalbe, MD  hydroxychloroquine (PLAQUENIL) 200 MG tablet Take 200 mg by mouth 2 (two) times daily.  06/07/15   Historical Provider, MD  levothyroxine (SYNTHROID, LEVOTHROID) 88 MCG tablet Take 1 tablet (88 mcg total) by mouth daily. 06/11/16   [name], MD  losartan (COZAAR) 100 MG tablet Take 1 tablet (100 mg total) by mouth daily. 10/12/15   Brittainy Sherlynn Carbon, PA-C  meloxicam (MOBIC) 7.5 MG tablet TAKE 1 TABLET BY MOUTH DAILY 02/14/16   [name], MD  Multiple Vitamins-Minerals (ALIVE WOMENS GUMMY) CHEW Chew 2 tablets by mouth daily.     Historical Provider, MD  omeprazole (PRILOSEC) 40 MG capsule TAKE ONE CAPSULE BY MOUTH DAILY BEFORE BREAKFAST. 04/25/16   Iva Boop, MD  tamsulosin (FLOMAX) 0.4 MG CAPS capsule Take 1 capsule (0.4 mg total) by mouth daily. 05/09/16   Doreene Nest, NP  Wheat Dextrin (BENEFIBER) POWD Take 2 scoop by mouth daily as needed (for constipation).     Historical Provider, MD  XIIDRA 5 % SOLN Place 1 drop into both eyes 2 (two) times daily. 10/16/15   Historical Provider, MD    Family History Family History  Problem Relation Age of Onset  . Pancreatic cancer Mother   . Diabetes Father   . Heart disease Father   . Glaucoma Father   . Kidney failure Brother     Social History Social History  Substance Use Topics  . Smoking status: Former Smoker    Packs/day: 2.00    Years: 12.00    Types: Cigarettes    Quit date: 05/06/1989  . Smokeless tobacco: Never Used     Comment: quit smoking 1990  . Alcohol use No     Allergies   Gabapentin (once-daily); Codeine; and Sulfonamide derivatives   Review of Systems Review of Systems 10 Systems reviewed and all are negative for acute change except as noted in the HPI.  Physical Exam Updated  Vital Signs BP 137/86   Pulse 93   Temp 99 F (37.2 C) (Oral)   Resp 18   SpO2 98%   Physical Exam  Constitutional: She is oriented to person, place, and time. She appears well-developed and well-nourished. No distress.  HENT:  Head: Normocephalic and atraumatic.  Nose: Nose normal.  Mouth/Throat: Oropharynx is clear and moist. No oropharyngeal exudate.  Eyes: Conjunctivae and EOM are normal. Pupils are equal, round, and reactive to light. No scleral icterus.  Neck: Normal range of motion. Neck supple. No JVD present. No tracheal deviation present. No thyromegaly present.  Cardiovascular: Normal rate, regular rhythm and normal heart sounds.  Exam reveals no gallop and no friction rub.   No murmur heard. Pulmonary/Chest: Effort normal and  breath sounds normal. No respiratory distress. She has no wheezes. She exhibits no tenderness.  Abdominal: Soft. Bowel sounds are normal. She exhibits no distension and no mass. There is no tenderness. There is no rebound and no guarding.  Musculoskeletal: Normal range of motion. She exhibits no edema or tenderness.  Lymphadenopathy:    She has no cervical adenopathy.  Neurological: She is alert and oriented to person, place, and time. No cranial nerve deficit. She exhibits normal muscle tone.  Skin: Skin is warm and dry. No rash noted. No erythema. No pallor.  Nursing note and vitals reviewed.    ED Treatments / Results  DIAGNOSTIC STUDIES: Oxygen Saturation is 98 percent on room air which is normal by my interpretation.    COORDINATION OF CARE: 1:13 AM Discussed treatment plan with pt at bedside and pt agreed to plan.  Labs (all labs ordered are listed, but only abnormal results are displayed) Labs Reviewed  BASIC METABOLIC PANEL - Abnormal; Notable for the following:       Result Value   Glucose, Bld 123 (*)    All other components within normal limits  CBC  I-STAT TROPOININ, ED    EKG  EKG Interpretation  Date/Time:  Tuesday  June 25 2016 20:57:51 EST Ventricular Rate:  93 PR Interval:  162 QRS Duration: 92 QT Interval:  358 QTC Calculation: 445 R Axis:   15 Text Interpretation:  Normal sinus rhythm Nonspecific T wave abnormality Abnormal ECG No significant change since last tracing Confirmed by Erroll Luna [phone]) on 06/26/2016 1:06:34 AM       Radiology Dg Chest 2 View  Result Date: 06/25/2016 CLINICAL DATA:  56 y/o  F; chest pain. EXAM: CHEST  2 VIEW COMPARISON:  10/05/2008 chest radiograph FINDINGS: Stable heart size and mediastinal contours are within normal limits. Both lungs are clear. Aortic atherosclerosis with calcification. The visualized skeletal structures are unremarkable. Right upper quadrant cholecystectomy clips. IMPRESSION: No active cardiopulmonary disease. Electronically Signed   By: Mitzi Hansen M.D.   On: 06/25/2016 21:45    Procedures Procedures (including critical care time)  Medications Ordered in ED Medications - No data to display   Initial Impression / Assessment and Plan / ED Course  I have reviewed the triage vital signs and the nursing notes.  Pertinent labs & imaging results that were available during my care of the patient were reviewed by me and considered in my medical decision making (see chart for details).     Patient presents to the ED for CP.  History is concerning for ACS given the L sided nature and radiation.  She was SOB and diaphoretic as well.  She was given ASA in the ED.  Troponin and EKG negative.  Plan to admit for further care.     Final Clinical Impressions(s) / ED Diagnoses   Final diagnoses:  None    New Prescriptions New Prescriptions   No medications on file     I personally performed the services described in this documentation, which was scribed in my presence. The recorded information has been reviewed and is accurate.      Tomasita Crumble, MD 06/26/16 [phone]

## 2016-06-26 NOTE — Discharge Summary (Signed)
Physician Discharge Summary  Kristine Curtis ZOX:[id number] DOB: July 07, 1960 DOA: 06/26/2016  PCP: [name], MD  Admit date: 06/26/2016 Discharge date: 06/26/2016  Admitted From: home Disposition:  home  Recommendations for Outpatient Follow-up:  1. Follow up with cardiology as arranged in 2 days  Home Health: none Equipment/Devices: none  Discharge Condition: stable CODE STATUS: Full Diet recommendation: heart healthy  HPI: Per Dr. Julian Reil, Kristine Curtis is a 56 y.o. female with medical history significant of NICM with EF of 40-45% on most recent echo (July 2017), followed by Dr. Sw[place].  Patient presents to the ED with c/o chest pain.  Symptoms initially started 3 weeks ago.  "squeezing" sensation, located in sternum and L chest with pain in L neck and L axilla.  Symptoms resolved however reoccured 24 hours ago which has persisted.  Associated dyspnea, diaphoresis, nausea which was ongoing throughout the day yesterday.  Associated generalized weakness for past 3 weeks.  Presented to ED for persistent symptoms.   Hospital Course: Discharge Diagnoses:  Principal Problem:   Chest pain, rule out acute myocardial infarction Active Problems:   Essential hypertension   Chronic systolic congestive heart failure (HCC)  Chest pain -patient was admitted to the hospital with chest pain.  Given cardiac history, cardiology was consulted to evaluate the patient.  They recommend further evaluation with a 2D echo and a Myoview which per cardiology can be done as an outpatient in 2 days in their office.  Patient was cleared for discharge, discussed with the cardiology PA, discussed with the patient, will be sent home in stable condition with close outpatient follow-up.  Serial cardiac markers have remained negative. NICM with chronic systolic CHF -Currently appears euvolemic, outpatient follow-up with cardiology Hypertension -Continue home medications Fibromyalgia Sjogren  sd  Discharge Instructions   Allergies as of 06/26/2016      Reactions   Gabapentin (once-daily)    "side effect to severe"   Codeine Itching   Sulfonamide Derivatives Itching      Medication List    TAKE these medications   ALIVE WOMENS GUMMY Chew Chew 2 tablets by mouth daily.   BENEFIBER Powd Take 2 scoop by mouth daily as needed (for constipation).   buPROPion 300 MG 24 hr tablet Commonly known as:  WELLBUTRIN XL Take 1 tablet (300 mg total) by mouth daily.   carvedilol 25 MG tablet Commonly known as:  COREG Take 1&1/2 tablets twice a day   COLACE PO Take 1 tablet by mouth as needed (CONSTIPATION).   cyclobenzaprine 5 MG tablet Commonly known as:  FLEXERIL Take 1 tablet by mouth 2 (two) times daily.   diazepam 5 MG tablet Commonly known as:  VALIUM TAKE ONE (1) TABLET BY MOUTH EVERY SIX HOURS AS NEEDED What changed:  See the new instructions.   furosemide 20 MG tablet Commonly known as:  LASIX Take 1 tablet (20 mg total) by mouth daily.   glucose blood test strip Commonly known as:  ONE TOUCH ULTRA TEST Use to check blood sugar once daily.   HYDROcodone-acetaminophen 5-325 MG tablet Commonly known as:  NORCO/VICODIN Take 1 tablet by mouth every 6 (six) hours as needed for moderate pain.   hydroxychloroquine 200 MG tablet Commonly known as:  PLAQUENIL Take 400 mg by mouth every morning.   levothyroxine 88 MCG tablet Commonly known as:  SYNTHROID, LEVOTHROID Take 1 tablet (88 mcg total) by mouth daily.   losartan 100 MG tablet Commonly known as:  COZAAR Take 1 tablet (100  mg total) by mouth daily.   meloxicam 7.5 MG tablet Commonly known as:  MOBIC TAKE 1 TABLET BY MOUTH DAILY   omeprazole 40 MG capsule Commonly known as:  PRILOSEC TAKE ONE CAPSULE BY MOUTH DAILY BEFORE BREAKFAST.   traMADol 50 MG tablet Commonly known as:  ULTRAM Take 0.5 tablets (25 mg total) by mouth every 6 (six) hours as needed.   Vitamin D-3 5000 units Tabs Take 1  capsule by mouth daily.   XIIDRA 5 % Soln Generic drug:  Lifitegrast Place 1 drop into both eyes 2 (two) times daily as needed (dry eyes).      Follow-up Information    Kristine Ducking, NP Follow up on 07/03/2016.   Specialties:  Nurse Practitioner, Cardiology, Radiology Why:  11:30 for hospital follow up and results of echo Contact information: [address] [place] [address] [phone]        [facility] [address] [facility] Follow up on 06/28/2016.   Specialty:  Cardiology Why:  8:30 AM  for limited echocardiogram, eval for effusion Office will call you to schedule a stress test Contact information: [address], [address] [place] [address] [phone]         Allergies  Allergen Reactions  . Gabapentin (Once-Daily)     "side effect to severe"  . Codeine Itching  . Sulfonamide Derivatives Itching    Consultations:  Cardiology   Procedures/Studies:  Dg Chest 2 View  Result Date: 06/25/2016 CLINICAL DATA:  56 y/o  F; chest pain. EXAM: CHEST  2 VIEW COMPARISON:  10/05/2008 chest radiograph FINDINGS: Stable heart size and mediastinal contours are within normal limits. Both lungs are clear. Aortic atherosclerosis with calcification. The visualized skeletal structures are unremarkable. Right upper quadrant cholecystectomy clips. IMPRESSION: No active cardiopulmonary disease. Electronically Signed   By: Mitzi Hansen M.D.   On: 06/25/2016 21:45      Subjective: - no chest pain, shortness of breath, no abdominal pain, nausea or vomiting.   Discharge Exam: Vitals:   06/26/16 1112 06/26/16 1206  BP: 106/71 134/69  Pulse: 84 82  Resp: 19 20  Temp:  97.9 F (36.6 C)   Vitals:   06/26/16 0930 06/26/16 1000 06/26/16 1112 06/26/16 1206  BP: 105/90 102/55 106/71 134/69  Pulse: 80 73 84 82  Resp: 22 21 19 20   Temp:    97.9 F (36.6 C)  TempSrc:    Oral  SpO2: 99% 98% 99% 100%  Weight:    105.4 kg (232 lb  6.4 oz)    General: Pt is alert, awake, not in acute distress Cardiovascular: RRR, S1/S2 +, no rubs, no gallops Respiratory: CTA bilaterally, no wheezing, no rhonchi Abdominal: Soft, NT, ND, bowel sounds + Extremities: no edema, no cyanosis    The results of significant diagnostics from this hospitalization (including imaging, microbiology, ancillary and laboratory) are listed below for reference.     Microbiology: No results found for this or any previous visit (from the past 240 hour(s)).   Labs: BNP (last 3 results)  Recent Labs  08/11/15 1039  BNP <4.0   Basic Metabolic Panel:  Recent Labs Lab 06/25/16 2115  NA 140  K 3.8  CL 107  CO2 24  GLUCOSE 123*  BUN 10  CREATININE 0.88  CALCIUM 9.7   Liver Function Tests: No results for input(s): AST, ALT, ALKPHOS, BILITOT, PROT, ALBUMIN in the last 168 hours. No results for input(s): LIPASE, AMYLASE in the last 168 hours. No  results for input(s): AMMONIA in the last 168 hours. CBC:  Recent Labs Lab 06/25/16 2115  WBC 5.8  HGB 12.9  HCT 38.3  MCV 91.8  PLT 246   Cardiac Enzymes:  Recent Labs Lab 06/26/16 0356 06/26/16 0946 06/26/16 1548  TROPONINI <0.03 <0.03 <0.03   BNP: Invalid input(s): POCBNP CBG: No results for input(s): GLUCAP in the last 168 hours. D-Dimer No results for input(s): DDIMER in the last 72 hours. Hgb A1c No results for input(s): HGBA1C in the last 72 hours. Lipid Profile No results for input(s): CHOL, HDL, LDLCALC, TRIG, CHOLHDL, LDLDIRECT in the last 72 hours. Thyroid function studies No results for input(s): TSH, T4TOTAL, T3FREE, THYROIDAB in the last 72 hours.  Invalid input(s): FREET3 Anemia work up No results for input(s): VITAMINB12, FOLATE, FERRITIN, TIBC, IRON, RETICCTPCT in the last 72 hours. Urinalysis    Component Value Date/Time   COLORURINE YELLOW 10/05/2008 0754   APPEARANCEUR CLOUDY (A) 10/05/2008 0754   LABSPEC 1.020 10/05/2008 0754   PHURINE 6.0  10/05/2008 0754   GLUCOSEU NEGATIVE 10/05/2008 0754   HGBUR SMALL (A) 10/05/2008 0754   BILIRUBINUR negative 05/09/2016 0948   KETONESUR 15 (A) 10/05/2008 0754   PROTEINUR +- 05/09/2016 0948   PROTEINUR NEGATIVE 10/05/2008 0754   UROBILINOGEN negative 05/09/2016 0948   UROBILINOGEN 0.2 10/05/2008 0754   NITRITE negative 05/09/2016 0948   NITRITE NEGATIVE 10/05/2008 0754   LEUKOCYTESUR Negative 05/09/2016 0948   Sepsis Labs Invalid input(s): PROCALCITONIN,  WBC,  LACTICIDVEN Microbiology No results found for this or any previous visit (from the past 240 hour(s)).   Time coordinating discharge: 25 minutes  SIGNED:  Pamella Pert, MD  [facility] 06/26/2016, 5:23 PM Pager [phone]  If 7PM-7AM, please contact night-coverage [email or web address] Password [email or web address]

## 2016-06-26 NOTE — Discharge Instructions (Addendum)
Follow with Cardiology as scheduled in 2 days  Please get a complete blood count and chemistry panel checked by your Primary MD at your next visit, and again as instructed by your Primary MD. Please get your medications reviewed and adjusted by your Primary MD.  Please request your Primary MD to go over all Hospital Tests and Procedure/Radiological results at the follow up, please get all Hospital records sent to your Prim MD by signing hospital release before you go home.  If you had Pneumonia of Lung problems at the Hospital: Please get a 2 view Chest X ray done in 6-8 weeks after hospital discharge or sooner if instructed by your Primary MD.  If you have Congestive Heart Failure: Please call your Cardiologist or Primary MD anytime you have any of the following symptoms:  1) 3 pound weight gain in 24 hours or 5 pounds in 1 week  2) shortness of breath, with or without a dry hacking cough  3) swelling in the hands, feet or stomach  4) if you have to sleep on extra pillows at night in order to breathe  Follow cardiac low salt diet and 1.5 lit/day fluid restriction.  If you have diabetes Accuchecks 4 times/day, Once in AM empty stomach and then before each meal. Log in all results and show them to your primary doctor at your next visit. If any glucose reading is under 80 or above 300 call your primary MD immediately.  If you have Seizure/Convulsions/Epilepsy: Please do not drive, operate heavy machinery, participate in activities at heights or participate in high speed sports until you have seen by Primary MD or a Neurologist and advised to do so again.  If you had Gastrointestinal Bleeding: Please ask your Primary MD to check a complete blood count within one week of discharge or at your next visit. Your endoscopic/colonoscopic biopsies that are pending at the time of discharge, will also need to followed by your Primary MD.  Get Medicines reviewed and adjusted. Please take all your  medications with you for your next visit with your Primary MD  Please request your Primary MD to go over all hospital tests and procedure/radiological results at the follow up, please ask your Primary MD to get all Hospital records sent to his/her office.  If you experience worsening of your admission symptoms, develop shortness of breath, life threatening emergency, suicidal or homicidal thoughts you must seek medical attention immediately by calling 911 or calling your MD immediately  if symptoms less severe.  You must read complete instructions/literature along with all the possible adverse reactions/side effects for all the Medicines you take and that have been prescribed to you. Take any new Medicines after you have completely understood and accpet all the possible adverse reactions/side effects.   Do not drive or operate heavy machinery when taking Pain medications.   Do not take more than prescribed Pain, Sleep and Anxiety Medications  Special Instructions: If you have smoked or chewed Tobacco  in the last 2 yrs please stop smoking, stop any regular Alcohol  and or any Recreational drug use.  Wear Seat belts while driving.  Please note You were cared for by a hospitalist during your hospital stay. If you have any questions about your discharge medications or the care you received while you were in the hospital after you are discharged, you can call the unit and asked to speak with the hospitalist on call if the hospitalist that took care of you is not available. Once  you are discharged, your primary care physician will handle any further medical issues. Please note that NO REFILLS for any discharge medications will be authorized once you are discharged, as it is imperative that you return to your primary care physician (or establish a relationship with a primary care physician if you do not have one) for your aftercare needs so that they can reassess your need for medications and monitor your  lab values.  You can reach the hospitalist office at phone [phone] or fax [phone]   If you do not have a primary care physician, you can call [phone] for a physician referral.  Activity: As tolerated with Full fall precautions use walker/cane & assistance as needed  Diet: heart healthy  Disposition Home  -----    You will have a Stress Test scheduled at [facility]. Your doctor has ordered this test to check the blood flow in your heart arteries.  Please arrive 15 minutes early for paperwork. The whole test will take several hours. You may want to bring reading material to remain occupied while undergoing different parts of the test.  Instructions:  No food/drink after midnight the night before.  It is OK to take your morning meds with a sip of water EXCEPT for those types of medicines listed below or otherwise instructed.  No caffeine/decaf products 24 hours before, including medicines such as Excedrin or Goody Powders. Call if there are any questions.   Wear comfortable clothes and shoes.   Special Medication Instructions:  Beta blockers such as metoprolol (Lopressor/Toprol XL), atenolol (Tenormin), carvedilol (Coreg), nebivolol (Bystolic), bisoprolol (Zebeta), propranolol (Inderal) should not be taken for 24 hours before the test.  Calcium channel blockers such as diltiazem (Cardizem) or verapmil (Calan) should not be taken for 24 hours before the test.  Remove nitroglycerin patches and do not take nitrate preparations such as Imdur/isosorbide the day of your test.  No Persantine/Theophylline or Aggrenox medicines should be used within 24 hours of the test.   If you are diabetic, please ask which medications to hold the day of the test.  What To Expect: When you arrive in the lab, the technician will inject a small amount of radioactive tracer into your arm through an IV while you are resting quietly. This helps [place] to form pictures of  your heart. You will likely only feel a sting from the IV. After a waiting period, resting pictures will be obtained under a big camera. These are the "before" pictures.  Next, you will be prepped for the stress portion of the test. This may include either walking on a treadmill or receiving a medicine that helps to dilate blood vessels in your heart to simulate the effect of exercise on your heart. If you are walking on a treadmill, you will walk at different paces to try to get your heart rate to a goal number that is based on your age. If your doctor has chosen the pharmacologic test, then you will receive a medicine through your IV that may cause temporary nausea, flushing, shortness of breath and sometimes chest discomfort or vomiting. This is typically short-lived and usually resolves quickly. If you experience symptoms, that does not automatically mean the test is abnormal. Some patients do not experience any symptoms at all. Your blood pressure and heart rate will be monitored, and we will be watching your EKG on a computer screen for any changes. During this portion of the test, the radiologist will inject another small amount  of radioactive tracer into your IV. After a waiting period, you will undergo a second set of pictures. These are the "after" pictures.  The doctor reading the test will compare the before-and-after images to look for evidence of heart blockages or heart weakness. The test usually takes 1 day to complete, but in certain instances (for example, if a patient is over a certain weight limit), the test may be done over the span of 2 days.

## 2016-06-26 NOTE — ED Notes (Signed)
Per Dr. Elvera Lennox pt is appropriate for tele bed at this time.

## 2016-06-27 ENCOUNTER — Other Ambulatory Visit: Payer: Self-pay | Admitting: Internal Medicine

## 2016-06-27 ENCOUNTER — Telehealth (HOSPITAL_COMMUNITY): Payer: Self-pay

## 2016-06-27 NOTE — Telephone Encounter (Signed)
Encounter complete. 

## 2016-06-28 ENCOUNTER — Other Ambulatory Visit: Payer: Self-pay

## 2016-06-28 ENCOUNTER — Ambulatory Visit (HOSPITAL_COMMUNITY): Payer: 59 | Attending: Cardiology

## 2016-06-28 ENCOUNTER — Other Ambulatory Visit: Payer: Self-pay | Admitting: Physician Assistant

## 2016-06-28 ENCOUNTER — Ambulatory Visit: Payer: 59 | Admitting: Student

## 2016-06-28 DIAGNOSIS — I5189 Other ill-defined heart diseases: Secondary | ICD-10-CM | POA: Diagnosis not present

## 2016-06-28 DIAGNOSIS — I517 Cardiomegaly: Secondary | ICD-10-CM | POA: Diagnosis not present

## 2016-06-28 DIAGNOSIS — I429 Cardiomyopathy, unspecified: Secondary | ICD-10-CM

## 2016-07-02 ENCOUNTER — Ambulatory Visit (HOSPITAL_COMMUNITY)
Admission: RE | Admit: 2016-07-02 | Discharge: 2016-07-02 | Disposition: A | Discharge status: Home / Self Care | Payer: 59 | Source: Ambulatory Visit | Attending: Cardiovascular Disease | Admitting: Cardiovascular Disease

## 2016-07-02 DIAGNOSIS — R9439 Abnormal result of other cardiovascular function study: Secondary | ICD-10-CM | POA: Diagnosis not present

## 2016-07-02 DIAGNOSIS — R079 Chest pain, unspecified: Secondary | ICD-10-CM | POA: Diagnosis not present

## 2016-07-02 DIAGNOSIS — I252 Old myocardial infarction: Secondary | ICD-10-CM | POA: Insufficient documentation

## 2016-07-02 DIAGNOSIS — I501 Left ventricular failure: Secondary | ICD-10-CM | POA: Insufficient documentation

## 2016-07-02 MED ORDER — TECHNETIUM TC 99M TETROFOSMIN IV KIT
32.0000 | PACK | Freq: Once | INTRAVENOUS | Status: AC | PRN
  Administered 2016-07-02: 32 via INTRAVENOUS
  Filled 2016-07-02: qty 32

## 2016-07-02 MED ORDER — AMINOPHYLLINE 25 MG/ML IV SOLN
75.0000 mg | Freq: Once | INTRAVENOUS | Status: AC
  Administered 2016-07-02: 75 mg via INTRAVENOUS

## 2016-07-02 MED ORDER — REGADENOSON 0.4 MG/5ML IV SOLN
0.4000 mg | Freq: Once | INTRAVENOUS | Status: AC
  Administered 2016-07-02: 0.4 mg via INTRAVENOUS

## 2016-07-03 ENCOUNTER — Ambulatory Visit: Payer: 59 | Admitting: Nurse Practitioner

## 2016-07-03 ENCOUNTER — Ambulatory Visit (HOSPITAL_COMMUNITY)
Admission: RE | Admit: 2016-07-03 | Discharge: 2016-07-03 | Disposition: A | Discharge status: Home / Self Care | Payer: 59 | Source: Ambulatory Visit | Attending: Cardiovascular Disease | Admitting: Cardiovascular Disease

## 2016-07-03 LAB — MYOCARDIAL PERFUSION IMAGING
LV dias vol: 100 mL (ref 46–106)
LV sys vol: 65 mL
Peak HR: 113 {beats}/min
Rest HR: 86 {beats}/min
SDS: 1
SRS: 6
SSS: 7
TID: 0.92

## 2016-07-03 MED ORDER — TECHNETIUM TC 99M TETROFOSMIN IV KIT
29.1000 | PACK | Freq: Once | INTRAVENOUS | Status: DC | PRN
Start: 2016-07-03 — End: 2017-03-23

## 2016-07-04 ENCOUNTER — Ambulatory Visit (INDEPENDENT_AMBULATORY_CARE_PROVIDER_SITE_OTHER): Payer: 59 | Admitting: Nurse Practitioner

## 2016-07-04 ENCOUNTER — Encounter: Payer: Self-pay | Admitting: Nurse Practitioner

## 2016-07-04 VITALS — BP 109/79 | HR 98 | Ht 63.0 in | Wt 235.6 lb

## 2016-07-04 DIAGNOSIS — R0789 Other chest pain: Secondary | ICD-10-CM

## 2016-07-04 DIAGNOSIS — I428 Other cardiomyopathies: Secondary | ICD-10-CM

## 2016-07-04 DIAGNOSIS — I5042 Chronic combined systolic (congestive) and diastolic (congestive) heart failure: Secondary | ICD-10-CM | POA: Diagnosis not present

## 2016-07-04 DIAGNOSIS — Z7901 Long term (current) use of anticoagulants: Secondary | ICD-10-CM | POA: Diagnosis not present

## 2016-07-04 DIAGNOSIS — Z01812 Encounter for preprocedural laboratory examination: Secondary | ICD-10-CM | POA: Diagnosis not present

## 2016-07-04 NOTE — Patient Instructions (Addendum)
Your physician has requested that you have a RIGHT and LEFT cardiac catheterization to be done  ASAP by Dr Swaziland. Cardiac catheterization is used to diagnose and/or treat various heart conditions. Doctors may recommend this procedure for a number of different reasons. The most common reason is to evaluate chest pain. Chest pain can be a symptom of coronary artery disease (CAD), and cardiac catheterization can show whether plaque is narrowing or blocking your heart's arteries. This procedure is also used to evaluate the valves, as well as measure the blood flow and oxygen levels in different parts of your heart. For further information please visit [email or web address]   Following your catheterization, you will not be allowed to drive for 3 days.  No lifting, pushing, or pulling greater that 10 pounds is allowed for 1 week.  You will be required to have the following tests prior to the procedure:  1. Blood work-  It can be done at [facility]. [address]. Lab slips have been provided to you today. This has to be done within 7 days of the procedure.( IT CANNOT BE OLDER THAN 7 DAYS.)

## 2016-07-04 NOTE — Progress Notes (Signed)
Cardiology Clinic Note   Patient Name: Kristine Curtis Date of Encounter: 07/04/2016  Primary Care Provider:  [name], MD Primary Cardiologist:  P. Swaziland, MD   Patient Profile    56 y/o female with a prior history of nonischemic cardiomyopathy and chronic combined systolic and diastolic heart failure, who presents for follow-up related to recent hospitalization and ongoing chest pain and dyspnea.  Past Medical History    Past Medical History:  Diagnosis Date  . Allergic rhinitis due to pollen   . Asthma   . Chronic combined systolic and diastolic CHF (congestive heart failure) (HCC)    a. 06/2016 Echo: EF 40-45%, Gr1 DD.  Marland Kitchen Depression   . Family history of polycystic kidney 11/16/2012   neg CT  . Fibromyalgia   . GERD (gastroesophageal reflux disease)   . Hypertension   . Hypothyroidism   . IBS (irritable bowel syndrome)   . Internal hemorrhoids   . Morbid obesity (HCC)   . NICM (nonischemic cardiomyopathy) (HCC)    a. 1999 Cath: nl cors;  b. EF prev as low as 35%;  c. 11/2015 Echo: Ef 40-45%;  d. 06/2016 Echo: EF 40-45%;  e. Lexiscan MV: fixed anterosepta/inferseptal defect w/o ischemia, EF 35%.  . Osteoarthritis   . PVC (premature ventricular contraction)   . Restless leg syndrome   . Sicca (HCC) 11/22/2014  . Vertigo    Past Surgical History:  Procedure Laterality Date  . ABDOMINAL HYSTERECTOMY    . CESAREAN SECTION    . CHOLECYSTECTOMY  10-2008  . COLONOSCOPY    . ESOPHAGOGASTRODUODENOSCOPY    . EYE SURGERY     2 2013 and 1981/DCR of right eye 05/2014  . LUMBAR DISC SURGERY  2016   L3-4  . SHOULDER ARTHROSCOPY W/ SUBACROMIAL DECOMPRESSION AND DISTAL CLAVICLE EXCISION Right    rotator cuff repair  . TUBAL LIGATION  1992    Allergies  Allergies  Allergen Reactions  . Gabapentin (Once-Daily)     "side effect to severe"  . Codeine Itching  . Sulfonamide Derivatives Itching    History of Present Illness    56 year old female with a prior history  of nonischemic cardiomyopathy with normal catheterization in 1999. Ejection fraction previously as low as 30-35% though more recently has been 40-45% by cardiac MRI and echo in 2017. She was recently seen at [facility] with a 24-hour history of chest pain, dyspnea, headaches, extremity weakness and heaviness. Cardiac enzymes were negative. She was seen by our team and recommendations made for outpatient stress testing and echo. Limited echo was performed that showed an EF of 40-45% without pericardial effusion. Stress testing was performed and showed a fixed anteroseptal and inferoseptal defect without ischemia. EF was measured at 35%. Today, she reports that since her hospitalization, she is continued to have significant dyspnea on exertion and reduced exercise tolerance along with intermittent rest and exertional substernal chest pressure that may radiate to her left arm or left jaw. Symptoms are very concerning for her. She says that she has gained weight over the past year and a half but as far she can tell, her fluid status has been stable. She has not had PND, orthopnea, syncope, edema, or early satiety.  Home Medications    Prior to Admission medications   Medication Sig Start Date End Date Taking? Authorizing Provider  buPROPion (WELLBUTRIN XL) 300 MG 24 hr tablet Take 1 tablet (300 mg total) by mouth daily. 04/25/16  Yes [name], MD  carvedilol (  COREG) 25 MG tablet Take 1&1/2 tablets twice a day 11/13/15  Yes Peter M Swaziland, MD  Cholecalciferol (VITAMIN D-3) 5000 UNITS TABS Take 1 capsule by mouth daily.   Yes Historical Provider, MD  cyclobenzaprine (FLEXERIL) 5 MG tablet Take 1 tablet by mouth 2 (two) times daily. 08/09/15  Yes Historical Provider, MD  diazepam (VALIUM) 5 MG tablet TAKE ONE (1) TABLET BY MOUTH EVERY SIX HOURS AS NEEDED Patient taking differently: TAKE ONE (1) TABLET BY MOUTH EVERY SIX HOURS AS NEEDED FOR ANXIETY 05/12/16  Yes Spencer Copland, MD  Docusate Sodium (COLACE PO)  Take 1 tablet by mouth as needed (CONSTIPATION).    Yes Historical Provider, MD  furosemide (LASIX) 20 MG tablet Take 1 tablet (20 mg total) by mouth daily. 08/11/15  Yes Brittainy M Simmons, PA-C  glucose blood (ONE TOUCH ULTRA TEST) test strip Use to check blood sugar once daily. 11/09/14  Yes [name], MD  HYDROcodone-acetaminophen (NORCO/VICODIN) 5-325 MG tablet Take 1 tablet by mouth every 6 (six) hours as needed for moderate pain. 05/02/16  Yes Karie Schwalbe, MD  hydroxychloroquine (PLAQUENIL) 200 MG tablet Take 400 mg by mouth every morning.  06/07/15  Yes Historical Provider, MD  levothyroxine (SYNTHROID, LEVOTHROID) 88 MCG tablet Take 1 tablet (88 mcg total) by mouth daily. 06/11/16  Yes [name], MD  losartan (COZAAR) 100 MG tablet Take 1 tablet (100 mg total) by mouth daily. 10/12/15  Yes Brittainy Sherlynn Carbon, PA-C  meloxicam (MOBIC) 7.5 MG tablet TAKE 1 TABLET BY MOUTH DAILY 02/14/16  Yes [name], MD  Multiple Vitamins-Minerals (ALIVE WOMENS GUMMY) CHEW Chew 2 tablets by mouth daily.    Yes Historical Provider, MD  omeprazole (PRILOSEC) 40 MG capsule TAKE ONE CAPSULE BY MOUTH DAILY BEFORE BREAKFAST. 06/27/16  Yes Iva Boop, MD  traMADol (ULTRAM) 50 MG tablet Take 0.5 tablets (25 mg total) by mouth every 6 (six) hours as needed. 06/26/16  Yes Costin Otelia Sergeant, MD  Wheat Dextrin (BENEFIBER) POWD Take 2 scoop by mouth daily as needed (for constipation).    Yes Historical Provider, MD  XIIDRA 5 % SOLN Place 1 drop into both eyes 2 (two) times daily as needed (dry eyes).  10/16/15  Yes Historical Provider, MD    Family History    Family History  Problem Relation Age of Onset  . Pancreatic cancer Mother   . Diabetes Father   . Heart disease Father   . Glaucoma Father   . Kidney failure Brother     Social History    Social History   Social History  . Marital status: Married    Spouse name: N/A  . Number of children: 2  . Years of education: N/A   Occupational  History  . quality assurance tech    Social History Main Topics  . Smoking status: Former Smoker    Packs/day: 2.00    Years: 12.00    Types: Cigarettes    Quit date: 05/06/1989  . Smokeless tobacco: Never Used     Comment: quit smoking 1990  . Alcohol use No  . Drug use: No  . Sexual activity: Not on file   Other Topics Concern  . Not on file   Social History Narrative   Former Dr. Andrey Campanile then Dr. 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  Cardiovascular:  +++  rest and ex chest pain, +++ dyspnea on exertion, no edema, orthopnea, palpitations, paroxysmal nocturnal dyspnea. +++ LH. Dermatological: No rash, lesions/masses Respiratory: No cough, +++ dyspnea Urologic: No hematuria, dysuria Abdominal:   No nausea, vomiting, diarrhea, bright red blood per rectum, melena, or hematemesis Neurologic:  No visual changes, +++ wkns, changes in mental status. All other systems reviewed and are otherwise negative except as noted above.  Physical Exam    VS:  BP 109/79   Pulse 98   Ht  (1.6 m)   Wt 235 lb 9.6 oz (106.9 kg)   SpO2 99%   BMI 41.73 kg/m  , BMI Body mass index is 41.73 kg/m. GEN: Well nourished, well developed, in no acute distress.  HEENT: normal.  Neck: Supple, difficult to gauge JVP secondary to neck girth. No carotid bruits, or masses. Cardiac: RRR, no murmurs, rubs, or gallops. No clubbing, cyanosis, trace bilateral ankle edema.  Radials/DP/PT 2+ and equal bilaterally.  Respiratory:  Respirations regular and unlabored, clear to auscultation bilaterally. GI: Soft, nontender, nondistended, BS + x 4. MS: no deformity or atrophy. Skin: warm and dry, no rash. Neuro:  Strength and sensation are intact. Psych: Normal affect.  Accessory Clinical Findings    ECG - February 20: Regular sinus rhythm, 93, nonspecific T changes.  Lexiscan Cardiolite 2.28.2018   The  left ventricular ejection fraction is moderately decreased (30-44%).  Nuclear stress EF: 35%.  No T wave inversion was noted during stress.  There was no ST segment deviation noted during stress.  Defect 1: There is a medium defect of moderate severity.  Findings consistent with prior myocardial infarction.   Medium size, moderate intensity fixed anteroseptal and inferoseptal perfusion defect suggestive of scar or possibly artifact. LVEF 35% with anteroseptal and septal akinesis and mild LV dilitation. This is an intermediate risk study. _____________  2D Echocardiogram 2.23.2018  Study Conclusions   - Left ventricle: The cavity size was mildly dilated. Wall   thickness was increased in a pattern of mild LVH. Systolic   function was mildly to moderately reduced. The estimated ejection   fraction was in the range of 40% to 45%. Diffuse hypokinesis.   Doppler parameters are consistent with abnormal left ventricular   relaxation (grade 1 diastolic dysfunction). - Left atrium: The atrium was mildly dilated.   Impressions:   - Limited study to assess LV function; full doppler study not   performed; mild to moderate global reduction in LV systolic   function; grade 1 diastolic dysfunction; mild LVH; mild LVE; mild   LAE.   Assessment & Plan   1.  Retrosternal chest pain: Patient with a prior history of normal coronary arteries by cardiac catheterization in 1999 was recently seen at [facility] secondary to chest pain and dyspnea. Cardiac markers were normal. ECG nonacute. She subsequently underwent stress testing yesterday which showed an area of anteroseptal and inferoseptal defect without evidence for ischemia. Echo was also performed and showed stable LV function with an EF of 40-45% without effusion. Overall findings were reassuring, however she has continued to have intermittent chest discomfort and also progressively worsening exercise tolerance. Though her weight is up 25 pounds  over the past year and a half, she believes her volume status has been stable. She is very concerned about her worsening symptoms. She is interested in right and left heart cardiac catheterization. I discussed her case with Dr. Herbie Baltimore today. I will check labs today including CBC, basic metabolic panel, INR, and a BNP.  The patient understands that risks include but are not limited to stroke (1 in 1000), death (1 in 1000), kidney failure [usually temporary] (1 in 500), bleeding (1 in 200), allergic reaction [possibly serious] (1 in 200), and agrees to proceed.  Continue current medications.  2. Nonischemic cardio myopathy/chronic combined systolic and diastolic congestive heart failure: As outlined above, patient has been having progressive reduction in exercise tolerance. She has also put on 25 pounds over the past year and a half. She believes is secondary to inactivity. Her volume status has reportedly been stable. On exam, she does not have significant edema, abdominal bloating, or crackles. JVP is difficult to gauge secondary to girth. She remains on beta blocker, ARB, and daily Lasix. I will check a BNP and as above, we will plan for right heart catheterization at the time of her diagnostic cath.  3. Disposition: Plan on right and left heart diagnostic catheterization to rule out obstructive coronary artery disease and assess filling pressures. Follow up in clinic in 2 weeks.  Nicolasa Ducking, NP 07/04/2016, 1:23 PM

## 2016-07-05 ENCOUNTER — Encounter: Payer: Self-pay | Admitting: Nurse Practitioner

## 2016-07-09 ENCOUNTER — Ambulatory Visit (HOSPITAL_COMMUNITY): Payer: 59

## 2016-07-10 ENCOUNTER — Ambulatory Visit (HOSPITAL_COMMUNITY): Payer: 59

## 2016-07-10 LAB — CBC
Hematocrit: 36.8 % (ref 34.0–46.6)
Hemoglobin: 12.6 g/dL (ref 11.1–15.9)
MCH: 30.8 pg (ref 26.6–33.0)
MCHC: 34.2 g/dL (ref 31.5–35.7)
MCV: 90 fL (ref 79–97)
Platelets: 271 10*3/uL (ref 150–379)
RBC: 4.09 x10E6/uL (ref 3.77–5.28)
RDW: 12.7 % (ref 12.3–15.4)
WBC: 9.5 10*3/uL (ref 3.4–10.8)

## 2016-07-11 ENCOUNTER — Ambulatory Visit (HOSPITAL_COMMUNITY)
Admission: RE | Admit: 2016-07-11 | Discharge: 2016-07-11 | Disposition: A | Discharge status: Home / Self Care | Payer: 59 | Source: Ambulatory Visit | Attending: Cardiology | Admitting: Cardiology

## 2016-07-11 ENCOUNTER — Other Ambulatory Visit: Payer: Self-pay | Admitting: Family Medicine

## 2016-07-11 ENCOUNTER — Encounter (HOSPITAL_COMMUNITY): Payer: Self-pay | Admitting: Cardiology

## 2016-07-11 ENCOUNTER — Encounter (HOSPITAL_COMMUNITY)
Admission: RE | Disposition: A | Discharge status: Home / Self Care | Payer: Self-pay | Source: Ambulatory Visit | Attending: Cardiology

## 2016-07-11 DIAGNOSIS — K219 Gastro-esophageal reflux disease without esophagitis: Secondary | ICD-10-CM | POA: Insufficient documentation

## 2016-07-11 DIAGNOSIS — E039 Hypothyroidism, unspecified: Secondary | ICD-10-CM | POA: Diagnosis not present

## 2016-07-11 DIAGNOSIS — M1990 Unspecified osteoarthritis, unspecified site: Secondary | ICD-10-CM | POA: Insufficient documentation

## 2016-07-11 DIAGNOSIS — I5022 Chronic systolic (congestive) heart failure: Secondary | ICD-10-CM

## 2016-07-11 DIAGNOSIS — R079 Chest pain, unspecified: Secondary | ICD-10-CM | POA: Diagnosis present

## 2016-07-11 DIAGNOSIS — J45909 Unspecified asthma, uncomplicated: Secondary | ICD-10-CM | POA: Insufficient documentation

## 2016-07-11 DIAGNOSIS — I110 Hypertensive heart disease with heart failure: Secondary | ICD-10-CM | POA: Insufficient documentation

## 2016-07-11 DIAGNOSIS — F329 Major depressive disorder, single episode, unspecified: Secondary | ICD-10-CM | POA: Insufficient documentation

## 2016-07-11 DIAGNOSIS — Z833 Family history of diabetes mellitus: Secondary | ICD-10-CM | POA: Insufficient documentation

## 2016-07-11 DIAGNOSIS — G2581 Restless legs syndrome: Secondary | ICD-10-CM | POA: Insufficient documentation

## 2016-07-11 DIAGNOSIS — Z882 Allergy status to sulfonamides status: Secondary | ICD-10-CM | POA: Insufficient documentation

## 2016-07-11 DIAGNOSIS — I428 Other cardiomyopathies: Secondary | ICD-10-CM | POA: Diagnosis not present

## 2016-07-11 DIAGNOSIS — I5042 Chronic combined systolic (congestive) and diastolic (congestive) heart failure: Secondary | ICD-10-CM | POA: Diagnosis not present

## 2016-07-11 DIAGNOSIS — R0789 Other chest pain: Secondary | ICD-10-CM

## 2016-07-11 DIAGNOSIS — M797 Fibromyalgia: Secondary | ICD-10-CM | POA: Insufficient documentation

## 2016-07-11 DIAGNOSIS — K589 Irritable bowel syndrome without diarrhea: Secondary | ICD-10-CM | POA: Diagnosis not present

## 2016-07-11 DIAGNOSIS — R072 Precordial pain: Secondary | ICD-10-CM | POA: Insufficient documentation

## 2016-07-11 DIAGNOSIS — I10 Essential (primary) hypertension: Secondary | ICD-10-CM | POA: Diagnosis present

## 2016-07-11 DIAGNOSIS — Z6841 Body Mass Index (BMI) 40.0 and over, adult: Secondary | ICD-10-CM | POA: Diagnosis not present

## 2016-07-11 DIAGNOSIS — Z87891 Personal history of nicotine dependence: Secondary | ICD-10-CM | POA: Diagnosis not present

## 2016-07-11 DIAGNOSIS — I493 Ventricular premature depolarization: Secondary | ICD-10-CM | POA: Diagnosis not present

## 2016-07-11 DIAGNOSIS — Z8249 Family history of ischemic heart disease and other diseases of the circulatory system: Secondary | ICD-10-CM | POA: Diagnosis not present

## 2016-07-11 HISTORY — PX: RIGHT/LEFT HEART CATH AND CORONARY ANGIOGRAPHY: CATH118266

## 2016-07-11 LAB — BASIC METABOLIC PANEL
BUN/Creatinine Ratio: 19 (ref 9–23)
BUN: 15 mg/dL (ref 6–24)
CO2: 20 mmol/L (ref 18–29)
Calcium: 9.2 mg/dL (ref 8.7–10.2)
Chloride: 103 mmol/L (ref 96–106)
Creatinine, Ser: 0.78 mg/dL (ref 0.57–1.00)
GFR calc Af Amer: 98 mL/min/{1.73_m2} (ref >59)
GFR calc non Af Amer: 85 mL/min/{1.73_m2} (ref >59)
Glucose: 144 mg/dL — ABNORMAL HIGH (ref 65–99)
Potassium: 4.1 mmol/L (ref 3.5–5.2)
Sodium: 140 mmol/L (ref 134–144)

## 2016-07-11 LAB — POCT I-STAT 3, VENOUS BLOOD GAS (G3P V)
Acid-base deficit: 2 mmol/L (ref 0.0–2.0)
Bicarbonate: 23 mmol/L (ref 20.0–28.0)
O2 Saturation: 96 %
TCO2: 24 mmol/L (ref 0–100)
pCO2, Ven: 36.7 mmHg — ABNORMAL LOW (ref 44.0–60.0)
pH, Ven: 7.404 (ref 7.250–7.430)
pO2, Ven: 80 mmHg — ABNORMAL HIGH (ref 32.0–45.0)

## 2016-07-11 LAB — BRAIN NATRIURETIC PEPTIDE: BNP: 51.4 pg/mL (ref 0.0–100.0)

## 2016-07-11 LAB — PROTIME-INR
INR: 1 (ref 0.8–1.2)
Prothrombin Time: 10.5 s (ref 9.1–12.0)

## 2016-07-11 LAB — APTT: aPTT: 24 s (ref 24–33)

## 2016-07-11 SURGERY — RIGHT/LEFT HEART CATH AND CORONARY ANGIOGRAPHY
Anesthesia: LOCAL

## 2016-07-11 MED ORDER — FENTANYL CITRATE (PF) 100 MCG/2ML IJ SOLN
INTRAMUSCULAR | Status: AC
  Filled 2016-07-11: qty 2

## 2016-07-11 MED ORDER — SODIUM CHLORIDE 0.9 % IV SOLN: 250.0000 mL | INTRAVENOUS | Status: DC | PRN

## 2016-07-11 MED ORDER — MIDAZOLAM HCL 2 MG/2ML IJ SOLN
INTRAMUSCULAR | Status: AC
  Filled 2016-07-11: qty 2

## 2016-07-11 MED ORDER — LIDOCAINE HCL (PF) 1 % IJ SOLN
INTRAMUSCULAR | Status: DC | PRN
  Administered 2016-07-11: 4 mL via INTRADERMAL

## 2016-07-11 MED ORDER — SODIUM CHLORIDE 0.9% FLUSH: 3.0000 mL | INTRAVENOUS | Status: DC | PRN

## 2016-07-11 MED ORDER — IOPAMIDOL (ISOVUE-370) INJECTION 76%
INTRAVENOUS | Status: AC
  Filled 2016-07-11: qty 100

## 2016-07-11 MED ORDER — VERAPAMIL HCL 2.5 MG/ML IV SOLN
INTRAVENOUS | Status: AC
  Filled 2016-07-11: qty 2

## 2016-07-11 MED ORDER — SODIUM CHLORIDE 0.9% FLUSH: 3.0000 mL | Freq: Two times a day (BID) | INTRAVENOUS | Status: DC

## 2016-07-11 MED ORDER — SODIUM CHLORIDE 0.9 % WEIGHT BASED INFUSION: 1.0000 mL/kg/h | INTRAVENOUS | Status: AC

## 2016-07-11 MED ORDER — HEPARIN (PORCINE) IN NACL 2-0.9 UNIT/ML-% IJ SOLN
INTRAMUSCULAR | Status: AC
  Filled 2016-07-11: qty 1000

## 2016-07-11 MED ORDER — HEPARIN SODIUM (PORCINE) 1000 UNIT/ML IJ SOLN
INTRAMUSCULAR | Status: AC
  Filled 2016-07-11: qty 1

## 2016-07-11 MED ORDER — IOPAMIDOL (ISOVUE-370) INJECTION 76%
INTRAVENOUS | Status: DC | PRN
  Administered 2016-07-11: 70 mL via INTRA_ARTERIAL

## 2016-07-11 MED ORDER — LIDOCAINE HCL (PF) 1 % IJ SOLN
INTRAMUSCULAR | Status: AC
  Filled 2016-07-11: qty 30

## 2016-07-11 MED ORDER — ASPIRIN 81 MG PO CHEW
81.0000 mg | CHEWABLE_TABLET | ORAL | Status: AC
  Administered 2016-07-11: 81 mg via ORAL

## 2016-07-11 MED ORDER — HEPARIN (PORCINE) IN NACL 2-0.9 UNIT/ML-% IJ SOLN
INTRAMUSCULAR | Status: DC | PRN
  Administered 2016-07-11: 1000 mL via INTRA_ARTERIAL

## 2016-07-11 MED ORDER — SODIUM CHLORIDE 0.9 % IV SOLN
INTRAVENOUS | Status: DC
  Administered 2016-07-11: 08:00:00 via INTRAVENOUS

## 2016-07-11 MED ORDER — VERAPAMIL HCL 2.5 MG/ML IV SOLN
INTRAVENOUS | Status: DC | PRN
  Administered 2016-07-11: 10 mL via INTRA_ARTERIAL

## 2016-07-11 MED ORDER — ASPIRIN 81 MG PO CHEW
CHEWABLE_TABLET | ORAL | Status: AC
  Administered 2016-07-11: 81 mg via ORAL
  Filled 2016-07-11: qty 1

## 2016-07-11 MED ORDER — SODIUM CHLORIDE 0.9% FLUSH
3.0000 mL | Freq: Two times a day (BID) | INTRAVENOUS | Status: DC
Start: 2016-07-11 — End: 2016-07-11

## 2016-07-11 MED ORDER — HEPARIN SODIUM (PORCINE) 1000 UNIT/ML IJ SOLN
INTRAMUSCULAR | Status: DC | PRN
Start: 2016-07-11 — End: 2016-07-11
  Administered 2016-07-11: 5000 [IU] via INTRAVENOUS

## 2016-07-11 MED ORDER — FENTANYL CITRATE (PF) 100 MCG/2ML IJ SOLN
INTRAMUSCULAR | Status: DC | PRN
  Administered 2016-07-11: 25 ug via INTRAVENOUS

## 2016-07-11 MED ORDER — MIDAZOLAM HCL 2 MG/2ML IJ SOLN
INTRAMUSCULAR | Status: DC | PRN
Start: 2016-07-11 — End: 2016-07-11
  Administered 2016-07-11: 1 mg via INTRAVENOUS

## 2016-07-11 SURGICAL SUPPLY — 14 items
CATH BALLN WEDGE 5F 110CM (CATHETERS) ×2 IMPLANT
CATH EXPO 5FR ANG PIGTAIL 145 (CATHETERS) ×2 IMPLANT
CATH EXPO 5FR FR4 (CATHETERS) ×2 IMPLANT
CATH INFINITI 5 FR JL3.5 (CATHETERS) ×2 IMPLANT
DEVICE RAD COMP TR BAND LRG (VASCULAR PRODUCTS) ×2 IMPLANT
GLIDESHEATH SLEND SS 6F .021 (SHEATH) ×2 IMPLANT
GUIDEWIRE INQWIRE 1.5J.035X260 (WIRE) ×1 IMPLANT
INQWIRE 1.5J .035X260CM (WIRE) ×2
KIT HEART LEFT (KITS) ×2 IMPLANT
PACK CARDIAC CATHETERIZATION (CUSTOM PROCEDURE TRAY) ×2 IMPLANT
SHEATH GLIDE SLENDER 4/5FR (SHEATH) ×2 IMPLANT
SYR MEDRAD MARK V 150ML (SYRINGE) ×2 IMPLANT
TRANSDUCER W/STOPCOCK (MISCELLANEOUS) ×2 IMPLANT
TUBING CIL FLEX 10 FLL-RA (TUBING) ×2 IMPLANT

## 2016-07-11 NOTE — H&P (View-Only) (Signed)
Cardiology Clinic Note   Patient Name: Kristine Curtis Date of Encounter: 07/04/2016  Primary Care Provider:  [name], MD Primary Cardiologist:  P. Swaz[place]and, MD   Patient Profile    56 y/o female with a prior history of nonischemic cardiomyopathy and chronic combined systolic and diastolic heart failure, who presents for follow-up related to recent hospitalization and ongoing chest pain and dyspnea.  Past Medical History    Past Medical History:  Diagnosis Date  . Allergic rhinitis due to pollen   . Asthma   . Chronic combined systolic and diastolic CHF (congestive heart failure) (HCC)    a. 06/2016 Echo: EF 40-45%, Gr1 DD.  Marland Kitchen Depression   . Family history of polycystic kidney 11/16/2012   neg CT  . Fibromyalgia   . GERD (gastroesophageal reflux disease)   . Hypertension   . Hypothyroidism   . IBS (irritable bowel syndrome)   . Internal hemorrhoids   . Morbid obesity (HCC)   . NICM (nonischemic cardiomyopathy) (HCC)    a. 1999 Cath: nl cors;  b. EF prev as low as 35%;  c. 11/2015 Echo: Ef 40-45%;  d. 06/2016 Echo: EF 40-45%;  e. Lexiscan MV: fixed anterosepta/inferseptal defect w/o ischemia, EF 35%.  . Osteoarthritis   . PVC (premature ventricular contraction)   . Restless leg syndrome   . Sicca (HCC) 11/22/2014  . Vertigo    Past Surgical History:  Procedure Laterality Date  . ABDOMINAL HYSTERECTOMY    . CESAREAN SECTION    . CHOLECYSTECTOMY  10-2008  . COLONOSCOPY    . ESOPHAGOGASTRODUODENOSCOPY    . EYE SURGERY     2 2013 and 1981/DCR of right eye 05/2014  . LUMBAR DISC SURGERY  2016   L3-4  . SHOULDER ARTHROSCOPY W/ SUBACROMIAL DECOMPRESSION AND DISTAL CLAVICLE EXCISION Right    rotator cuff repair  . TUBAL LIGATION  1992    Allergies  Allergies  Allergen Reactions  . Gabapentin (Once-Daily)     "side effect to severe"  . Codeine Itching  . Sulfonamide Derivatives Itching    History of Present Illness    56 year old female with a prior history  of nonischemic cardiomyopathy with normal catheterization in 1999. Ejection fraction previously as low as 30-35% though more recently has been 40-45% by cardiac MRI and echo in 2017. She was recently seen at [facility] with a 24-hour history of chest pain, dyspnea, headaches, extremity weakness and heaviness. Cardiac enzymes were negative. She was seen by our team and recommendations made for outpatient stress testing and echo. Limited echo was performed that showed an EF of 40-45% without pericardial effusion. Stress testing was performed and showed a fixed anteroseptal and inferoseptal defect without ischemia. EF was measured at 35%. Today, she reports that since her hospitalization, she is continued to have significant dyspnea on exertion and reduced exercise tolerance along with intermittent rest and exertional substernal chest pressure that may radiate to her left arm or left jaw. Symptoms are very concerning for her. She says that she has gained weight over the past year and a half but as far she can tell, her fluid status has been stable. She has not had PND, orthopnea, syncope, edema, or early satiety.  Home Medications    Prior to Admission medications   Medication Sig Start Date End Date Taking? Authorizing Provider  buPROPion (WELLBUTRIN XL) 300 MG 24 hr tablet Take 1 tablet (300 mg total) by mouth daily. 04/25/16  Yes [name], MD  carvedilol (  COREG) 25 MG tablet Take 1&1/2 tablets twice a day 11/13/15  Yes Peter M Swaziland, MD  Cholecalciferol (VITAMIN D-3) 5000 UNITS TABS Take 1 capsule by mouth daily.   Yes Historical Provider, MD  cyclobenzaprine (FLEXERIL) 5 MG tablet Take 1 tablet by mouth 2 (two) times daily. 08/09/15  Yes Historical Provider, MD  diazepam (VALIUM) 5 MG tablet TAKE ONE (1) TABLET BY MOUTH EVERY SIX HOURS AS NEEDED Patient taking differently: TAKE ONE (1) TABLET BY MOUTH EVERY SIX HOURS AS NEEDED FOR ANXIETY 05/12/16  Yes Spencer Copland, MD  Docusate Sodium (COLACE PO)  Take 1 tablet by mouth as needed (CONSTIPATION).    Yes Historical Provider, MD  furosemide (LASIX) 20 MG tablet Take 1 tablet (20 mg total) by mouth daily. 08/11/15  Yes Brittainy M Simmons, PA-C  glucose blood (ONE TOUCH ULTRA TEST) test strip Use to check blood sugar once daily. 11/09/14  Yes [name], MD  HYDROcodone-acetaminophen (NORCO/VICODIN) 5-325 MG tablet Take 1 tablet by mouth every 6 (six) hours as needed for moderate pain. 05/02/16  Yes Karie Schwalbe, MD  hydroxychloroquine (PLAQUENIL) 200 MG tablet Take 400 mg by mouth every morning.  06/07/15  Yes Historical Provider, MD  levothyroxine (SYNTHROID, LEVOTHROID) 88 MCG tablet Take 1 tablet (88 mcg total) by mouth daily. 06/11/16  Yes [name], MD  losartan (COZAAR) 100 MG tablet Take 1 tablet (100 mg total) by mouth daily. 10/12/15  Yes Brittainy Sherlynn Carbon, PA-C  meloxicam (MOBIC) 7.5 MG tablet TAKE 1 TABLET BY MOUTH DAILY 02/14/16  Yes [name], MD  Multiple Vitamins-Minerals (ALIVE WOMENS GUMMY) CHEW Chew 2 tablets by mouth daily.    Yes Historical Provider, MD  omeprazole (PRILOSEC) 40 MG capsule TAKE ONE CAPSULE BY MOUTH DAILY BEFORE BREAKFAST. 06/27/16  Yes Iva Boop, MD  traMADol (ULTRAM) 50 MG tablet Take 0.5 tablets (25 mg total) by mouth every 6 (six) hours as needed. 06/26/16  Yes Costin Otelia Sergeant, MD  Wheat Dextrin (BENEFIBER) POWD Take 2 scoop by mouth daily as needed (for constipation).    Yes Historical Provider, MD  XIIDRA 5 % SOLN Place 1 drop into both eyes 2 (two) times daily as needed (dry eyes).  10/16/15  Yes Historical Provider, MD    Family History    Family History  Problem Relation Age of Onset  . Pancreatic cancer Mother   . Diabetes Father   . Heart disease Father   . Glaucoma Father   . Kidney failure Brother     Social History    Social History   Social History  . Marital status: Married    Spouse name: N/A  . Number of children: 2  . Years of education: N/A   Occupational  History  . quality assurance tech    Social History Main Topics  . Smoking status: Former Smoker    Packs/day: 2.00    Years: 12.00    Types: Cigarettes    Quit date: 05/06/1989  . Smokeless tobacco: Never Used     Comment: quit smoking 1990  . Alcohol use No  . Drug use: No  . Sexual activity: Not on file   Other Topics Concern  . Not on file   Social History Narrative   Former Dr. Andrey Campanile then Dr. 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  Cardiovascular:  +++  rest and ex chest pain, +++ dyspnea on exertion, no edema, orthopnea, palpitations, paroxysmal nocturnal dyspnea. +++ LH. Dermatological: No rash, lesions/masses Respiratory: No cough, +++ dyspnea Urologic: No hematuria, dysuria Abdominal:   No nausea, vomiting, diarrhea, bright red blood per rectum, melena, or hematemesis Neurologic:  No visual changes, +++ wkns, changes in mental status. All other systems reviewed and are otherwise negative except as noted above.  Physical Exam    VS:  BP 109/79   Pulse 98   Ht  (1.6 m)   Wt 235 lb 9.6 oz (106.9 kg)   SpO2 99%   BMI 41.73 kg/m  , BMI Body mass index is 41.73 kg/m. GEN: Well nourished, well developed, in no acute distress.  HEENT: normal.  Neck: Supple, difficult to gauge JVP secondary to neck girth. No carotid bruits, or masses. Cardiac: RRR, no murmurs, rubs, or gallops. No clubbing, cyanosis, trace bilateral ankle edema.  Radials/DP/PT 2+ and equal bilaterally.  Respiratory:  Respirations regular and unlabored, clear to auscultation bilaterally. GI: Soft, nontender, nondistended, BS + x 4. MS: no deformity or atrophy. Skin: warm and dry, no rash. Neuro:  Strength and sensation are intact. Psych: Normal affect.  Accessory Clinical Findings    ECG - February 20: Regular sinus rhythm, 93, nonspecific T changes.  Lexiscan Cardiolite 2.28.2018   The  left ventricular ejection fraction is moderately decreased (30-44%).  Nuclear stress EF: 35%.  No T wave inversion was noted during stress.  There was no ST segment deviation noted during stress.  Defect 1: There is a medium defect of moderate severity.  Findings consistent with prior myocardial infarction.   Medium size, moderate intensity fixed anteroseptal and inferoseptal perfusion defect suggestive of scar or possibly artifact. LVEF 35% with anteroseptal and septal akinesis and mild LV dilitation. This is an intermediate risk study. _____________  2D Echocardiogram 2.23.2018  Study Conclusions   - Left ventricle: The cavity size was mildly dilated. Wall   thickness was increased in a pattern of mild LVH. Systolic   function was mildly to moderately reduced. The estimated ejection   fraction was in the range of 40% to 45%. Diffuse hypokinesis.   Doppler parameters are consistent with abnormal left ventricular   relaxation (grade 1 diastolic dysfunction). - Left atrium: The atrium was mildly dilated.   Impressions:   - Limited study to assess LV function; full doppler study not   performed; mild to moderate global reduction in LV systolic   function; grade 1 diastolic dysfunction; mild LVH; mild LVE; mild   LAE.   Assessment & Plan   1.  Retrosternal chest pain: Patient with a prior history of normal coronary arteries by cardiac catheterization in 1999 was recently seen at [facility] secondary to chest pain and dyspnea. Cardiac markers were normal. ECG nonacute. She subsequently underwent stress testing yesterday which showed an area of anteroseptal and inferoseptal defect without evidence for ischemia. Echo was also performed and showed stable LV function with an EF of 40-45% without effusion. Overall findings were reassuring, however she has continued to have intermittent chest discomfort and also progressively worsening exercise tolerance. Though her weight is up 25 pounds  over the past year and a half, she believes her volume status has been stable. She is very concerned about her worsening symptoms. She is interested in right and left heart cardiac catheterization. I discussed her case with Dr. Herbie Baltimore today. I will check labs today including CBC, basic metabolic panel, INR, and a BNP.  The patient understands that risks include but are not limited to stroke (1 in 1000), death (1 in 1000), kidney failure [usually temporary] (1 in 500), bleeding (1 in 200), allergic reaction [possibly serious] (1 in 200), and agrees to proceed.  Continue current medications.  2. Nonischemic cardio myopathy/chronic combined systolic and diastolic congestive heart failure: As outlined above, patient has been having progressive reduction in exercise tolerance. She has also put on 25 pounds over the past year and a half. She believes is secondary to inactivity. Her volume status has reportedly been stable. On exam, she does not have significant edema, abdominal bloating, or crackles. JVP is difficult to gauge secondary to girth. She remains on beta blocker, ARB, and daily Lasix. I will check a BNP and as above, we will plan for right heart catheterization at the time of her diagnostic cath.  3. Disposition: Plan on right and left heart diagnostic catheterization to rule out obstructive coronary artery disease and assess filling pressures. Follow up in clinic in 2 weeks.  Nicolasa Ducking, NP 07/04/2016, 1:23 PM

## 2016-07-11 NOTE — Interval H&P Note (Signed)
History and Physical Interval Note:  07/11/2016 8:44 AM  Kristine Curtis  has presented today for surgery, with the diagnosis of chf  The various methods of treatment have been discussed with the patient and family. After consideration of risks, benefits and other options for treatment, the patient has consented to  Procedure(s): Right/Left Heart Cath and Coronary Angiography (N/A) as a surgical intervention .  The patient's history has been reviewed, patient examined, no change in status, stable for surgery.  I have reviewed the patient's chart and labs.  Questions were answered to the patient's satisfaction.   Cath Lab Visit (complete for each Cath Lab visit)  Clinical Evaluation Leading to the Procedure:   ACS: Yes.    Non-ACS:    Anginal Classification: CCS III  Anti-ischemic medical therapy: Maximal Therapy (2 or more classes of medications)  Non-Invasive Test Results: No non-invasive testing performed  Prior CABG: No previous CABG        Theron Arista [facility] 07/11/2016 8:44 AM

## 2016-07-11 NOTE — Telephone Encounter (Signed)
Last office visit 03/27/16.  Last refilled 05/12/16 for #30 with 3 refills.  Ok to refill?

## 2016-07-11 NOTE — Discharge Instructions (Signed)
Radial Site Care °Refer to this sheet in the next few weeks. These instructions provide you with information about caring for yourself after your procedure. Your health care provider may also give you more specific instructions. Your treatment has been planned according to current medical practices, but problems sometimes occur. Call your health care provider if you have any problems or questions after your procedure. °What can I expect after the procedure? °After your procedure, it is typical to have the following: °· Bruising at the radial site that usually fades within 1-2 weeks. °· Blood collecting in the tissue (hematoma) that may be painful to the touch. It should usually decrease in size and tenderness within 1-2 weeks. °Follow these instructions at home: °· Take medicines only as directed by your health care provider. °· You may shower 24-48 hours after the procedure or as directed by your health care provider. Remove the bandage (dressing) and gently wash the site with plain soap and water. Pat the area dry with a clean towel. Do not rub the site, because this may cause bleeding. °· Do not take baths, swim, or use a hot tub until your health care provider approves. °· Check your insertion site every day for redness, swelling, or drainage. °· Do not apply powder or lotion to the site. °· Do not flex or bend the affected arm for 24 hours or as directed by your health care provider. °· Do not push or pull heavy objects with the affected arm for 24 hours or as directed by your health care provider. °· Do not lift over 10 lb (4.5 kg) for 5 days after your procedure or as directed by your health care provider. °· Ask your health care provider when it is okay to: °¨ Return to work or school. °¨ Resume usual physical activities or sports. °¨ Resume sexual activity. °· Do not drive home if you are discharged the same day as the procedure. Have someone else drive you. °· You may drive 24 hours after the procedure  unless otherwise instructed by your health care provider. °· Do not operate machinery or power tools for 24 hours after the procedure. °· If your procedure was done as an outpatient procedure, which means that you went home the same day as your procedure, a responsible adult should be with you for the first 24 hours after you arrive home. °· Keep all follow-up visits as directed by your health care provider. This is important. °Contact a health care provider if: °· You have a fever. °· You have chills. °· You have increased bleeding from the radial site. Hold pressure on the site. °Get help right away if: °· You have unusual pain at the radial site. °· You have redness, warmth, or swelling at the radial site. °· You have drainage (other than a small amount of blood on the dressing) from the radial site. °· The radial site is bleeding, and the bleeding does not stop after 30 minutes of holding steady pressure on the site. °· Your arm or hand becomes pale, cool, tingly, or numb. °This information is not intended to replace advice given to you by your health care provider. Make sure you discuss any questions you have with your health care provider. °Document Released: 05/25/2010 Document Revised: 09/28/2015 Document Reviewed: 11/08/2013 °Elsevier Interactive Patient Education © 2017 [employer] ° °

## 2016-07-12 ENCOUNTER — Encounter: Payer: Self-pay | Admitting: Cardiology

## 2016-07-12 ENCOUNTER — Other Ambulatory Visit: Payer: Self-pay | Admitting: Family Medicine

## 2016-07-12 LAB — POCT I-STAT 3, ART BLOOD GAS (G3+)
Acid-base deficit: 2 mmol/L (ref 0.0–2.0)
Bicarbonate: 23.3 mmol/L (ref 20.0–28.0)
O2 Saturation: 69 %
TCO2: 25 mmol/L (ref 0–100)
pCO2 arterial: 41.6 mmHg (ref 32.0–48.0)
pH, Arterial: 7.356 (ref 7.350–7.450)
pO2, Arterial: 38 mmHg — CL (ref 83.0–108.0)

## 2016-07-12 NOTE — Progress Notes (Signed)
Cardiology Office Note   Date:  07/15/2016   ID:  PAIZLIE KLAUS, DOB March 18, 1961, MRN [id number]  PCP:  [name], MD  Cardiologist:   Coltyn Hanning [place], MD   Chief Complaint  Patient presents with  . Follow-up    F/U after heart cath.  . Congestive Heart Failure      History of Present Illness: Kristine Curtis is a 56 y.o. female who is seen for follow up nonischemic cardiomyopathy. She is seen for follow up post cardiac cath. She was evaluated in over 10 years ago for a dilated cardiomyopathy and PVCs.  Coronary arteries were normal by cardiac cath.  She was seen by Dr. Ladona Ridgel for EP evaluation. No change in symptoms with beta blocker and essentially reassured.   Last year she presented with palpitations. She had evaluation with an Echo and Holter monitor. Holter showed rare PAC and PVC. Echo showed EF 30-35% with mild enlargement. Diffuse hypokinesis. Cardiac MRI showed EF 44% with global hypokinesis. No scar or infiltration.  She was started on Coreg and losartan. Medications adjusted. Repeat Echo in July 2017 showed EF 40-45%.   She was admitted in February  at [facility] with a 24-hour history of chest pain, dyspnea, headaches, extremity weakness and heaviness. Cardiac enzymes were negative. She was seen by our team and recommendations made for outpatient stress testing and echo. Limited echo was performed that showed an EF of 40-45% without pericardial effusion. Stress testing was performed and showed a fixed anteroseptal and inferoseptal defect without ischemia. EF was measured at 35%. After her hospitalization, she  continued to have significant dyspnea on exertion and reduced exercise tolerance along with intermittent rest and exertional substernal chest pressure that may radiate to her left arm or left jaw. Symptoms are very concerning for her. She subsequently underwent [facility] on 07/11/16 which showed normal coronaries. No pulmonary HTN. Normal cardiac output and EF 35-40%.     On follow up today she is doing OK. No edema. No chest pain. She still has dyspnea with just dressing. The skipping in her heart rhythm has improved significantly. She finds it difficult to exercise regularly due to her fibromyalgia, IBS, and chronic fatigue. She does report she had a sleep study in the past that showed no OSA but restless legs.    Past Medical History:  Diagnosis Date  . Allergic rhinitis due to pollen   . Asthma   . Chronic combined systolic and diastolic CHF (congestive heart failure) (HCC)    a. 06/2016 Echo: EF 40-45%, Gr1 DD.  Marland Kitchen Depression   . Family history of polycystic kidney 11/16/2012   neg CT  . Fibromyalgia   . GERD (gastroesophageal reflux disease)   . Hypertension   . Hypothyroidism   . IBS (irritable bowel syndrome)   . Internal hemorrhoids   . Morbid obesity (HCC)   . NICM (nonischemic cardiomyopathy) (HCC)    a. 1999 Cath: nl cors;  b. EF prev as low as 35%;  c. 11/2015 Echo: Ef 40-45%;  d. 06/2016 Echo: EF 40-45%;  e. Lexiscan MV: fixed anterosepta/inferseptal defect w/o ischemia, EF 35%.  . Osteoarthritis   . PVC (premature ventricular contraction)   . Restless leg syndrome   . Sicca (HCC) 11/22/2014  . Vertigo     Past Surgical History:  Procedure Laterality Date  . ABDOMINAL HYSTERECTOMY    . CESAREAN SECTION    . CHOLECYSTECTOMY  10-2008  . COLONOSCOPY    . ESOPHAGOGASTRODUODENOSCOPY    .  EYE SURGERY     2 2013 and 1981/DCR of right eye 05/2014  . LUMBAR DISC SURGERY  2016   L3-4  . RIGHT/LEFT HEART CATH AND CORONARY ANGIOGRAPHY N/A 07/11/2016   Procedure: Right/Left Heart Cath and Coronary Angiography;  Surgeon: Haydyn Girvan M [place], MD;  Location: [facility] INVASIVE CV LAB;  Service: Cardiovascular;  Laterality: N/A;  . SHOULDER ARTHROSCOPY W/ SUBACROMIAL DECOMPRESSION AND DISTAL CLAVICLE EXCISION Right    rotator cuff repair  . TUBAL LIGATION  1992     Current Outpatient Prescriptions  Medication Sig Dispense Refill  . acetaminophen (TYLENOL)  500 MG tablet Take 1,000 mg by mouth every 4 (four) hours as needed for moderate pain or headache.    . Aspirin-Acetaminophen-Caffeine (EXCEDRIN EXTRA STRENGTH PO) Take 2 tablets by mouth 2 (two) times daily as needed (headaches).    Marland Kitchen buPROPion (WELLBUTRIN XL) 300 MG 24 hr tablet Take 1 tablet (300 mg total) by mouth daily. 30 tablet 11  . Carboxymethylcellul-Glycerin (LUBRICATING EYE DROPS OP) Apply 1 drop to eye daily as needed (dry eyes).    . carvedilol (COREG) 25 MG tablet Take 1&1/2 tablets twice a day (Patient taking differently: Take 37.5 mg by mouth 2 (two) times daily. ) 90 tablet 6  . cevimeline (EVOXAC) 30 MG capsule Take 30 mg by mouth 3 (three) times daily as needed (dry mouth).    . Cholecalciferol (VITAMIN D-3) 5000 UNITS TABS Take 5,000 Units by mouth daily.     . cyclobenzaprine (FLEXERIL) 5 MG tablet Take 5 mg by mouth 2 (two) times daily.     . diazepam (VALIUM) 5 MG tablet TAKE ONE (1) TABLET BY MOUTH EVERY SIX HOURS AS NEEDED 30 tablet 5  . Docusate Sodium (COLACE PO) Take 1 tablet by mouth as needed (CONSTIPATION).     . furosemide (LASIX) 20 MG tablet Take 1 tablet (20 mg total) by mouth daily. 90 tablet 3  . HYDROcodone-acetaminophen (NORCO/VICODIN) 5-325 MG tablet Take 1 tablet by mouth every 6 (six) hours as needed for moderate pain. (Patient taking differently: Take 1 tablet by mouth 3 (three) times daily as needed for moderate pain. ) 60 tablet 0  . hydroxychloroquine (PLAQUENIL) 200 MG tablet Take 400 mg by mouth every morning.     Marland Kitchen levothyroxine (SYNTHROID, LEVOTHROID) 88 MCG tablet Take 1 tablet (88 mcg total) by mouth daily. 30 tablet 5  . losartan (COZAAR) 100 MG tablet Take 1 tablet (100 mg total) by mouth daily. 30 tablet 11  . meloxicam (MOBIC) 7.5 MG tablet TAKE 1 TABLET BY MOUTH DAILY 30 tablet 5  . Multiple Vitamins-Minerals (ALIVE WOMENS GUMMY) CHEW Chew 2 tablets by mouth daily.     Marland Kitchen omeprazole (PRILOSEC) 40 MG capsule TAKE ONE CAPSULE BY MOUTH DAILY  BEFORE BREAKFAST. 30 capsule 0  . ONE TOUCH ULTRA TEST test strip USE AS DIRECTED TO CHECK BLOOD SUGAR ONCE A DAY 100 each 3  . oxyCODONE-acetaminophen (PERCOCET/ROXICET) 5-325 MG tablet Take 1 tablet by mouth daily as needed for severe pain.     . traMADol (ULTRAM) 50 MG tablet Take 0.5 tablets (25 mg total) by mouth every 6 (six) hours as needed. 12 tablet 0  . Wheat Dextrin (BENEFIBER) POWD Take 2 scoop by mouth daily as needed (for constipation).     Marland Kitchen XIIDRA 5 % SOLN Place 1 drop into both eyes daily as needed (dry eyes).     Marland Kitchen spironolactone (ALDACTONE) 25 MG tablet Take 1 tablet (25 mg total) by mouth daily.  90 tablet 3   No current facility-administered medications for this visit.    Facility-Administered Medications Ordered in Other Visits  Medication Dose Route Frequency Provider Last Rate Last Dose  . technetium tetrofosmin (TC-MYOVIEW) injection 29.1 millicurie  29.1 millicurie Intravenous Once PRN Lars Masson, MD        Allergies:   Adhesive [tape]; Gabapentin (once-daily); Codeine; and Sulfonamide derivatives    Social History:  The patient  reports that she quit smoking about 27 years ago. Her smoking use included Cigarettes. She has a 24.00 pack-year smoking history. She has never used smokeless tobacco. She reports that she does not drink alcohol or use drugs.   Family History:  The patient's family history includes Diabetes in her father; Glaucoma in her father; Heart disease in her father; Kidney failure in her brother; Pancreatic cancer in her mother.    ROS:  Please see the history of present illness.   Otherwise, review of systems are positive for none.   All other systems are reviewed and negative.    PHYSICAL EXAM: VS:  BP 134/78   Pulse 81   Ht 5\' 3"  (1.6 m)   Wt 234 lb (106.1 kg)   BMI 41.45 kg/m  , BMI Body mass index is 41.45 kg/m. GEN: Well nourished, obese,  in no acute distress  HEENT: normal  Neck: no JVD, carotid bruits, or masses Cardiac:  RRR; no murmurs, rubs, or gallops,no edema  Respiratory:  clear to auscultation bilaterally, normal work of breathing GI: soft, nontender, nondistended, + BS MS: no deformity or atrophy  Skin: warm and dry, no rash Neuro:  Strength and sensation are intact Psych: euthymic mood, full affect   EKG:  EKG is  Not ordered today.    Recent Labs: 03/20/2016: ALT 17 05/27/2016: TSH 4.60 06/25/2016: Hemoglobin 12.9 07/10/2016: BNP 51.4; BUN 15; Creatinine, Ser 0.78; Platelets 271; Potassium 4.1; Sodium 140    Lipid Panel    Component Value Date/Time   CHOL 165 03/20/2016 0836   TRIG (H) 03/20/2016 0836    419.0 Triglyceride is over 400; calculations on Lipids are invalid.   HDL 37.20 (L) 03/20/2016 0836   CHOLHDL 4 03/20/2016 0836   VLDL 18.6 02/02/2015 0901   LDLCALC 85 02/02/2015 0901   LDLDIRECT 75.0 03/20/2016 0836      Wt Readings from Last 3 Encounters:  07/15/16 234 lb (106.1 kg)  07/11/16 235 lb (106.6 kg)  07/04/16 235 lb 9.6 oz (106.9 kg)      Other studies Reviewed: Additional studies/ records that were reviewed today include: Echo and MRI  Review of the above records demonstrates:  Echo 04/26/15:Study Conclusions  - Left ventricle: The cavity size was mildly dilated. Systolic function was moderately to severely reduced. The estimated ejection fraction was in the range of 30% to 35%. Moderate diffuse hypokinesis with no identifiable regional variations. There was fusion of early and atrial contributions to ventricular filling. The study is not technically sufficient to allow evaluation of LV diastolic function. No evidence of thrombus. - Mitral valve: There was mild regurgitation. - Left atrium: The atrium was mildly dilated.  CARDIAC MRI  TECHNIQUE: The patient was scanned on a 1.5 Tesla GE magnet. A dedicated cardiac coil was used. Functional imaging was done using Fiesta sequences. 2,3, and 4 chamber views were done to assess for  RWMA's. Modified Simpson's rule using a short axis stack was used to calculate an ejection fraction on a dedicated work [employer]. The patient  received 38 cc of Multihance. After 10 minutes inversion recovery sequences were used to assess for infiltration and scar tissue.  CONTRAST: 38 cc Multihance  FINDINGS: There was mild LAE. The RA/RV were normal in size and function. There was mild LVE. There was diffuse hypokinesis. The mitral aortic and tricuspid valves were structurally normal  There was no pericardial effusion There was no ASD/VSD. The quantitative EF was 44% (EDV 110 cc ESV 61 cc SV 49 cc) Delayed gadolinium images showed no scar or infarct tissue  IMPRESSION: 1) Mild LVE with diffuse hypokinesis EF 44%  2) No scar tissue or infarct on delayed gadolinium images  3) Mild LAE  4) Normal RV  5) Normal Valves  6) No pericardial effusion  Charlton Haws   Electronically Signed  By: Charlton Haws M.D.  On: 03/17/2015 13:22  Echo dated 11/28/15: Study Conclusions  - Left ventricle: The cavity size was normal. Wall thickness was   normal. Systolic function was mildly to moderately reduced. The   estimated ejection fraction was in the range of 40% to 45%.   Diffuse hypokinesis. Doppler parameters are consistent with   abnormal left ventricular relaxation (grade 1 diastolic   dysfunction).  Impressions:  - EF is mildly improved when compared to prior (30%).  Echo 06/28/16: Study Conclusions  - Left ventricle: The cavity size was mildly dilated. Wall   thickness was increased in a pattern of mild LVH. Systolic   function was mildly to moderately reduced. The estimated ejection   fraction was in the range of 40% to 45%. Diffuse hypokinesis.   Doppler parameters are consistent with abnormal left ventricular   relaxation (grade 1 diastolic dysfunction). - Left atrium: The atrium was mildly dilated.  Impressions:  -  Limited study to assess LV function; full doppler study not   performed; mild to moderate global reduction in LV systolic   function; grade 1 diastolic dysfunction; mild LVH; mild LVE; mild   LAE.  Myoview 07/03/16: Study Highlights     The left ventricular ejection fraction is moderately decreased (30-44%).  Nuclear stress EF: 35%.  No T wave inversion was noted during stress.  There was no ST segment deviation noted during stress.  Defect 1: There is a medium defect of moderate severity.  Findings consistent with prior myocardial infarction.   Medium size, moderate intensity fixed anteroseptal and inferoseptal perfusion defect suggestive of scar or possibly artifact. LVEF 35% with anteroseptal and septal akinesis and mild LV dilitation. This is an intermediate risk study.   Procedures   Right/Left Heart Cath and Coronary Angiography  Conclusion     There is moderate left ventricular systolic dysfunction.  The left ventricular ejection fraction is 35-45% by visual estimate.  LV end diastolic pressure is mildly elevated.   1. Normal coronary anatomy 2. Moderate LV dysfunction- global. EF 35-40%.  3. Mildly elevated LVEDP 4. Normal pulmonary pressures. RA pressure is elevated. 5. Normal cardiac output.  Plan: continue medical therapy. Consider addition of aldactone to current therapy      Indications   Chest pain, unspecified type [R07.9 (ICD-10-CM)]  Procedural Details/Technique   Technical Details Indication: 56 yo WF with history of nonischemic cardiomyopathy presents with symptoms of exertional chest pain and dyspnea. Myoview study is intermediate risk.  Procedural Details: The right wrist was prepped, draped, and anesthetized with 1% lidocaine. Using the modified Seldinger technique a 6 Fr slender sheath was placed in the right radial artery and a 5 French sheath was placed in  the right brachial vein. A Swan-Ganz catheter was used for the right heart  catheterization. Standard protocol was followed for recording of right heart pressures and sampling of oxygen saturations. Fick cardiac output was calculated. Standard Judkins catheters were used for selective coronary angiography and left ventriculography. There were no immediate procedural complications. The patient was transferred to the post catheterization recovery area for further monitoring. Contrast:70 cc   Estimated blood loss <50 mL.  During this procedure the patient was administered the following to achieve and maintain moderate conscious sedation: Versed 1 mg, Fentanyl 25 mcg, while the patient's heart rate, blood pressure, and oxygen saturation were continuously monitored. The period of conscious sedation was 28 minutes, of which I was present face-to-face 100% of this time.    Complications   Complications documented before study signed (07/11/2016 9:40 AM EST)    No complications were associated with this study.  Documented by Symiah Nowotny M [place], MD - 07/11/2016 9:38 AM EST    Coronary Findings   Dominance: Co-dominant  Left Main  Vessel was injected. Vessel is normal in caliber. Vessel is angiographically normal.  Left Anterior Descending  Vessel was injected. Vessel is normal in caliber. Vessel is angiographically normal.  Ramus Intermedius  Vessel was injected. Vessel is small. Vessel is angiographically normal.  Left Circumflex  Vessel was injected. Vessel is large. Vessel is angiographically normal.  Right Coronary Artery  Vessel was injected. Vessel is normal in caliber. Vessel is angiographically normal.  Wall Motion              Left Heart   Left Ventricle The left ventricular size is normal. There is moderate left ventricular systolic dysfunction. LV end diastolic pressure is mildly elevated. The left ventricular ejection fraction is 35-45% by visual estimate. There are LV function abnormalities due to global hypokinesis.    Coronary Diagrams   Diagnostic  Diagram     Implants     No implant documentation for this case.  PACS Images   Show images for Cardiac catheterization   Link to Procedure Log   Procedure Log    Hemo Data   Flowsheet Row Most Recent Value  Fick Cardiac Output 5.95 L/min  Fick Cardiac Output Index 2.87 (L/min)/BSA  RA A Wave 15 mmHg  RA V Wave 15 mmHg  RA Mean 13 mmHg  RV Systolic Pressure 38 mmHg  RV Diastolic Pressure 9 mmHg  RV EDP 15 mmHg  PA Systolic Pressure 36 mmHg  PA Diastolic Pressure 12 mmHg  PA Mean 22 mmHg  PW A Wave 21 mmHg  PW V Wave 23 mmHg  PW Mean 19 mmHg  AO Systolic Pressure 121 mmHg  AO Diastolic Pressure 81 mmHg  AO Mean 100 mmHg  LV Systolic Pressure 137 mmHg  LV Diastolic Pressure 2 mmHg  LV EDP 23 mmHg  Arterial Occlusion Pressure Extended Systolic Pressure 134 mmHg  Arterial Occlusion Pressure Extended Diastolic Pressure 79 mmHg  Arterial Occlusion Pressure Extended Mean Pressure 104 mmHg  Left Ventricular Apex Extended Systolic Pressure 133 mmHg  Left Ventricular Apex Extended Diastolic Pressure 4 mmHg  Left Ventricular Apex Extended EDP Pressure 22 mmHg  QP/QS 1  TPVR Index 7.65 HRUI  TSVR Index 34.79 HRUI  PVR SVR Ratio 0.03  TPVR/TSVR Ratio 0.22    ASSESSMENT AND PLAN:  1.  Nonischemic cardiomyopathy with chronic systolic CHF. Class 1. Echo initially showed EF 30-35%. By MRI EF 44%. MRI is more accurate given body habitus. Repeat Echo in July showed  EF 40-45% more consistent with MRI. Repeat Echo in February showed EF 40-45%. Abnormal Myoview. Cardiac cath done showing normal coronary arteries. Normal pulmonary pressures. Mildly elevated LVEDP. Normal C.O. She is on optimal doses of lasix, Coreg, and losartan. Will add aldactone 25 mg daily. Repeat BMET in 3 days and again 2 weeks later. Will schedule for PFTs to further assess her ongoing dyspnea. I suspect a lot of this is related to deconditioning and morbid obesity. I will follow up in 3 months.   Current  medicines are reviewed at length with the patient today.  The patient does not have concerns regarding medicines.  The following changes have been made:  See above.  Labs/ tests ordered today include:  No orders of the defined types were placed in this encounter.    Disposition:   Follow up in 6 months  Signed, Declan Adamson [place], MD  07/15/2016 9:13 AM    [facility] [address] [place], [place], [address] Phone [phone], Fax [phone]

## 2016-07-13 NOTE — Telephone Encounter (Signed)
Ok to refill 30, 5 refills 

## 2016-07-15 ENCOUNTER — Encounter: Payer: Self-pay | Admitting: Cardiology

## 2016-07-15 ENCOUNTER — Ambulatory Visit (INDEPENDENT_AMBULATORY_CARE_PROVIDER_SITE_OTHER): Payer: 59 | Admitting: Cardiology

## 2016-07-15 VITALS — BP 134/78 | HR 81 | Ht 63.0 in | Wt 234.0 lb

## 2016-07-15 DIAGNOSIS — I428 Other cardiomyopathies: Secondary | ICD-10-CM

## 2016-07-15 DIAGNOSIS — I10 Essential (primary) hypertension: Secondary | ICD-10-CM

## 2016-07-15 DIAGNOSIS — I5042 Chronic combined systolic (congestive) and diastolic (congestive) heart failure: Secondary | ICD-10-CM

## 2016-07-15 MED ORDER — SPIRONOLACTONE 25 MG PO TABS: 25.0000 mg | ORAL_TABLET | Freq: Every day | ORAL | 3 refills | Status: DC

## 2016-07-15 NOTE — Patient Instructions (Addendum)
We will start you on aldactone 25 mg daily  We need to check your potassium in 3 days and again 2 weeks later.   Continue your other therapy  We will schedule you for pulmonary function testing  I will see you in 3 months

## 2016-07-15 NOTE — Telephone Encounter (Signed)
Diazepam called into [facility] - [place], [place] - [address] Phone: [phone]

## 2016-07-17 ENCOUNTER — Other Ambulatory Visit: Payer: Self-pay | Admitting: Cardiology

## 2016-07-17 NOTE — Telephone Encounter (Signed)
Rx(s) sent to pharmacy electronically.  

## 2016-07-19 LAB — BASIC METABOLIC PANEL
BUN/Creatinine Ratio: 28 — ABNORMAL HIGH (ref 9–23)
BUN: 27 mg/dL — ABNORMAL HIGH (ref 6–24)
CO2: 26 mmol/L (ref 18–29)
Calcium: 9 mg/dL (ref 8.7–10.2)
Chloride: 99 mmol/L (ref 96–106)
Creatinine, Ser: 0.98 mg/dL (ref 0.57–1.00)
GFR calc Af Amer: 75 mL/min/{1.73_m2} (ref >59)
GFR calc non Af Amer: 65 mL/min/{1.73_m2} (ref >59)
Glucose: 91 mg/dL (ref 65–99)
Potassium: 4.8 mmol/L (ref 3.5–5.2)
Sodium: 139 mmol/L (ref 134–144)

## 2016-07-22 ENCOUNTER — Ambulatory Visit (HOSPITAL_COMMUNITY)
Admission: RE | Admit: 2016-07-22 | Discharge: 2016-07-22 | Disposition: A | Discharge status: Home / Self Care | Payer: 59 | Source: Ambulatory Visit | Attending: Cardiology | Admitting: Cardiology

## 2016-07-22 DIAGNOSIS — J984 Other disorders of lung: Secondary | ICD-10-CM | POA: Insufficient documentation

## 2016-07-22 DIAGNOSIS — I5042 Chronic combined systolic (congestive) and diastolic (congestive) heart failure: Secondary | ICD-10-CM | POA: Diagnosis present

## 2016-07-22 DIAGNOSIS — I428 Other cardiomyopathies: Secondary | ICD-10-CM

## 2016-07-22 DIAGNOSIS — I10 Essential (primary) hypertension: Secondary | ICD-10-CM

## 2016-07-22 DIAGNOSIS — J449 Chronic obstructive pulmonary disease, unspecified: Secondary | ICD-10-CM | POA: Diagnosis not present

## 2016-07-22 LAB — PULMONARY FUNCTION TEST
DL/VA % pred: 100 %
DL/VA: 4.72 ml/min/mmHg/L
DLCO cor % pred: 79 %
DLCO cor: 18.23 ml/min/mmHg
DLCO unc % pred: 79 %
DLCO unc: 18.23 ml/min/mmHg
FEF 25-75 Post: 1.93 L/sec
FEF 25-75 Pre: 1.89 L/sec
FEF2575-%Change-Post: 2 %
FEF2575-%Pred-Post: 78 %
FEF2575-%Pred-Pre: 77 %
FEV1-%Change-Post: 1 %
FEV1-%Pred-Post: 79 %
FEV1-%Pred-Pre: 78 %
FEV1-Post: 2.04 L
FEV1-Pre: 2 L
FEV1FVC-%Change-Post: 0 %
FEV1FVC-%Pred-Pre: 98 %
FEV6-%Change-Post: 1 %
FEV6-%Pred-Post: 81 %
FEV6-%Pred-Pre: 80 %
FEV6-Post: 2.59 L
FEV6-Pre: 2.56 L
FEV6FVC-%Change-Post: 0 %
FEV6FVC-%Pred-Post: 103 %
FEV6FVC-%Pred-Pre: 102 %
FVC-%Change-Post: 0 %
FVC-%Pred-Post: 79 %
FVC-%Pred-Pre: 78 %
FVC-Post: 2.59 L
FVC-Pre: 2.57 L
Post FEV1/FVC ratio: 79 %
Post FEV6/FVC ratio: 100 %
Pre FEV1/FVC ratio: 78 %
Pre FEV6/FVC Ratio: 99 %
RV % pred: 103 %
RV: 1.93 L
TLC % pred: 89 %
TLC: 4.4 L

## 2016-07-22 MED ORDER — ALBUTEROL SULFATE (2.5 MG/3ML) 0.083% IN NEBU
2.5000 mg | INHALATION_SOLUTION | Freq: Once | RESPIRATORY_TRACT | Status: AC
  Administered 2016-07-22: 2.5 mg via RESPIRATORY_TRACT

## 2016-07-29 ENCOUNTER — Other Ambulatory Visit: Payer: Self-pay | Admitting: Internal Medicine

## 2016-08-01 ENCOUNTER — Telehealth: Payer: Self-pay | Admitting: Cardiology

## 2016-08-01 NOTE — Telephone Encounter (Signed)
Pt notified she will stop coreg and have lab tomorrow and call us if this does not work

## 2016-08-01 NOTE — Telephone Encounter (Signed)
To start with let's reduce her Coreg to 25 mg bid. Continue her other therapy We will see what her blood work looks like.  Romesha Scherer [place] MD, [facility]

## 2016-08-01 NOTE — Telephone Encounter (Signed)
New Message    Please call pt would like to speak to you about the medication she is on and about her bp.   Her bp has been going low and she is concerned , went to er for pain in chest and arm on the left side .

## 2016-08-01 NOTE — Telephone Encounter (Signed)
Spoke with pt states that she is having low BP (90-100's/60-70's) spells when she feels dizziness, weakness in Bilateral LE and pre-syncope these spells last for several hours even after her BP goes back up to normal(118/78 and  131/79)the patient states that today her husband told her to take a little bit of salt and this seemed to help and her BP went back up. She states that her weight is down from 237.5 to 229 (8#), she states that the spironolactone has decreased her appetite also. She states that her breathing is much better since starting her spironolactone. Pt denies any chest pain or pressure, nausea or vomiting. She states that she has pending labs and will have those done tomorrow. She states that she wanted to let Dr [place] know what is happening and see what to do next. Please advise

## 2016-08-03 LAB — BASIC METABOLIC PANEL
BUN/Creatinine Ratio: 17 (ref 9–23)
BUN: 15 mg/dL (ref 6–24)
CO2: 23 mmol/L (ref 18–29)
Calcium: 9.3 mg/dL (ref 8.7–10.2)
Chloride: 103 mmol/L (ref 96–106)
Creatinine, Ser: 0.9 mg/dL (ref 0.57–1.00)
GFR calc Af Amer: 83 mL/min/{1.73_m2} (ref >59)
GFR calc non Af Amer: 72 mL/min/{1.73_m2} (ref >59)
Glucose: 100 mg/dL — ABNORMAL HIGH (ref 65–99)
Potassium: 4.4 mmol/L (ref 3.5–5.2)
Sodium: 142 mmol/L (ref 134–144)

## 2016-08-12 ENCOUNTER — Other Ambulatory Visit (INDEPENDENT_AMBULATORY_CARE_PROVIDER_SITE_OTHER): Payer: 59

## 2016-08-12 ENCOUNTER — Other Ambulatory Visit: Payer: Self-pay | Admitting: Cardiology

## 2016-08-12 ENCOUNTER — Other Ambulatory Visit: Payer: Self-pay | Admitting: Family Medicine

## 2016-08-12 DIAGNOSIS — E039 Hypothyroidism, unspecified: Secondary | ICD-10-CM

## 2016-08-12 LAB — TSH: TSH: 3.97 u[IU]/mL (ref 0.35–4.50)

## 2016-08-12 LAB — T3, FREE: T3, Free: 3.7 pg/mL (ref 2.3–4.2)

## 2016-08-12 LAB — T4, FREE: Free T4: 0.97 ng/dL (ref 0.60–1.60)

## 2016-08-12 NOTE — Telephone Encounter (Signed)
Last office visit  05/09/2016 with Allayne Gitelman for Urinary Frequency.  Last refilled 02/14/2016 for #30 with 5 refills.  Ok to refill?

## 2016-09-13 ENCOUNTER — Other Ambulatory Visit: Payer: Self-pay

## 2016-09-13 NOTE — Telephone Encounter (Signed)
Pt left v/m requesting rx hydrocodone apap. Call when ready for pick up. Pt cannot control intermittent nerve pain with OTC med. Last printed # 60 on 05/02/16; last annual exam 03/27/16. Dr Patsy Lager still off schedule on 09/16/16.

## 2016-09-16 NOTE — Telephone Encounter (Signed)
Pt should be able to wait until Dr. Salena Saner returns on 5/16 for refill

## 2016-09-17 MED ORDER — HYDROCODONE-ACETAMINOPHEN 5-325 MG PO TABS: 1.0000 | ORAL_TABLET | Freq: Three times a day (TID) | ORAL | 0 refills | Status: DC | PRN

## 2016-09-17 NOTE — Telephone Encounter (Signed)
10/05/15 controlled substance contract signed, uds sample given, low risk with a yearly screen per doctor Copland, next screen 10/04/16.  Pain Contract signed Kristine Curtis  Last UDS 10/05/2015 NCCSR reviewed on 09/17/2016.Marland Kitchen No concerns Last OV pain meds discussed: per PCP   Refilled pain med for 1 month further refill per PCP.

## 2016-09-17 NOTE — Telephone Encounter (Signed)
Kristine Curtis notified that her prescription is ready to be picked up at the front desk.

## 2016-09-24 ENCOUNTER — Other Ambulatory Visit: Payer: Self-pay | Admitting: Internal Medicine

## 2016-10-16 NOTE — Progress Notes (Signed)
Cardiology Office Note   Date:  10/17/2016   ID:  Kristine Curtis, DOB [birth year], MRN [id number]  PCP:  [name], MD  Cardiologist:   Kimiye Strathman [place], MD   Chief Complaint  Patient presents with  . Congestive Heart Failure    History of Present Illness: Kristine Curtis is a 56 y.o. female who is seen for follow up nonischemic cardiomyopathy. She was evaluated in over 10 years ago for a dilated cardiomyopathy and PVCs.  Coronary arteries were normal by cardiac cath.  She was seen by Dr. Ladona Ridgel for EP evaluation. No change in symptoms with beta blocker and essentially reassured.   Last year she presented with palpitations. She had evaluation with an Echo and Holter monitor. Holter showed rare PAC and PVC. Echo showed EF 30-35% with mild enlargement. Diffuse hypokinesis. Cardiac MRI showed EF 44% with global hypokinesis. No scar or infiltration.  She was started on Coreg and losartan. Medications adjusted. Repeat Echo in July 2017 showed EF 40-45%.   She was admitted in February 2018 at [facility] with a 24-hour history of chest pain, dyspnea, headaches, extremity weakness and heaviness. Cardiac enzymes were negative. She was seen by our team and recommendations made for outpatient stress testing and echo. Limited echo was performed that showed an EF of 40-45% without pericardial effusion. Stress testing was performed and showed a fixed anteroseptal and inferoseptal defect without ischemia. EF was measured at 35%. After her hospitalization, she  continued to have significant dyspnea on exertion and reduced exercise tolerance along with intermittent rest and exertional substernal chest pressure that may radiate to her left arm or left jaw.  She subsequently underwent [facility] on 07/11/16 which showed normal coronaries. No pulmonary HTN. Normal cardiac output and EF 35-40%.   She  reports she had a sleep study in the past that showed no OSA but restless legs. She was scheduled for PFTs  which showed some restrictive lung disease but no obstruction she was started on aldactone to optimize her medication. She developed some low BPs and her Coreg dose was decreased.   On follow up today she states she is doing OK. Still has SOB and this is particularly bad on "Code Orange" days. Still feels heavy in arms and legs. Some lightheadedness. States BP is steadier now. The weird feelings she was having before are not nearly as bad or as frequent. She is working on a new "56 days" diet that looks very healthy.    Past Medical History:  Diagnosis Date  . Allergic rhinitis due to pollen   . Asthma   . Chronic combined systolic and diastolic CHF (congestive heart failure) (HCC)    a. 06/2016 Echo: EF 40-45%, Gr1 DD.  Marland Kitchen Depression   . Family history of polycystic kidney 11/16/2012   neg CT  . Fibromyalgia   . GERD (gastroesophageal reflux disease)   . Hypertension   . Hypothyroidism   . IBS (irritable bowel syndrome)   . Internal hemorrhoids   . Morbid obesity (HCC)   . NICM (nonischemic cardiomyopathy) (HCC)    a. 1999 Cath: nl cors;  b. EF prev as low as 35%;  c. 11/2015 Echo: Ef 40-45%;  d. 06/2016 Echo: EF 40-45%;  e. Lexiscan MV: fixed anterosepta/inferseptal defect w/o ischemia, EF 35%.  . Osteoarthritis   . PVC (premature ventricular contraction)   . Restless leg syndrome   . Sicca (HCC) 11/22/2014  . Vertigo     Past Surgical History:  Procedure  Laterality Date  . ABDOMINAL HYSTERECTOMY    . CESAREAN SECTION    . CHOLECYSTECTOMY  10-2008  . COLONOSCOPY    . ESOPHAGOGASTRODUODENOSCOPY    . EYE SURGERY     2 2013 and 1981/DCR of right eye 05/2014  . LUMBAR DISC SURGERY  2016   L3-4  . RIGHT/LEFT HEART CATH AND CORONARY ANGIOGRAPHY N/A 07/11/2016   Procedure: Right/Left Heart Cath and Coronary Angiography;  Surgeon: Alyce Inscore M [place], MD;  Location: [facility] INVASIVE CV LAB;  Service: Cardiovascular;  Laterality: N/A;  . SHOULDER ARTHROSCOPY W/ SUBACROMIAL DECOMPRESSION AND DISTAL  CLAVICLE EXCISION Right    rotator cuff repair  . TUBAL LIGATION  1992     Current Outpatient Prescriptions  Medication Sig Dispense Refill  . acetaminophen (TYLENOL) 500 MG tablet Take 1,000 mg by mouth every 4 (four) hours as needed for moderate pain or headache.    . Aspirin-Acetaminophen-Caffeine (EXCEDRIN EXTRA STRENGTH PO) Take 2 tablets by mouth 2 (two) times daily as needed (headaches).    Marland Kitchen buPROPion (WELLBUTRIN XL) 300 MG 24 hr tablet Take 1 tablet (300 mg total) by mouth daily. 30 tablet 11  . carvedilol (COREG) 25 MG tablet Take 1.5 tablets (37.5 mg total) by mouth 2 (two) times daily with a meal. 90 tablet 11  . cevimeline (EVOXAC) 30 MG capsule Take 30 mg by mouth 3 (three) times daily as needed (dry mouth).    . Cholecalciferol (VITAMIN D-3) 5000 UNITS TABS Take 5,000 Units by mouth daily.     . cyclobenzaprine (FLEXERIL) 5 MG tablet Take 5 mg by mouth 2 (two) times daily.     . diazepam (VALIUM) 5 MG tablet TAKE ONE (1) TABLET BY MOUTH EVERY SIX HOURS AS NEEDED 30 tablet 5  . Docusate Sodium (COLACE PO) Take 1 tablet by mouth as needed (CONSTIPATION).     . furosemide (LASIX) 20 MG tablet TAKE 1 TABLET BY MOUTH DAILY 90 tablet 0  . HYDROcodone-acetaminophen (NORCO/VICODIN) 5-325 MG tablet Take 1 tablet by mouth 3 (three) times daily as needed for moderate pain. 60 tablet 0  . hydroxychloroquine (PLAQUENIL) 200 MG tablet Take 400 mg by mouth every morning.     Marland Kitchen levothyroxine (SYNTHROID, LEVOTHROID) 88 MCG tablet Take 1 tablet (88 mcg total) by mouth daily. 30 tablet 5  . losartan (COZAAR) 100 MG tablet Take 1 tablet (100 mg total) by mouth daily. 30 tablet 11  . meloxicam (MOBIC) 7.5 MG tablet TAKE 1 TABLET BY MOUTH DAILY 30 tablet 5  . Multiple Vitamins-Minerals (ALIVE WOMENS GUMMY) CHEW Chew 2 tablets by mouth daily.     Marland Kitchen omeprazole (PRILOSEC) 40 MG capsule TAKE ONE CAPSULE BY MOUTH DAILY BEFORE BREAKFAST 30 capsule 1  . ONE TOUCH ULTRA TEST test strip USE AS DIRECTED TO  CHECK BLOOD SUGAR ONCE A DAY 100 each 3  . oxyCODONE-acetaminophen (PERCOCET/ROXICET) 5-325 MG tablet Take 1 tablet by mouth daily as needed for severe pain.     Marland Kitchen spironolactone (ALDACTONE) 25 MG tablet Take 1 tablet (25 mg total) by mouth daily. 90 tablet 3  . traMADol (ULTRAM) 50 MG tablet Take 0.5 tablets (25 mg total) by mouth every 6 (six) hours as needed. 12 tablet 0  . Wheat Dextrin (BENEFIBER) POWD Take 2 scoop by mouth daily as needed (for constipation).     Marland Kitchen XIIDRA 5 % SOLN Place 1 drop into both eyes daily as needed (dry eyes).      No current facility-administered medications for this visit.  Facility-Administered Medications Ordered in Other Visits  Medication Dose Route Frequency Provider Last Rate Last Dose  . technetium tetrofosmin (TC-MYOVIEW) injection 29.1 millicurie  29.1 millicurie Intravenous Once PRN Lars Masson, MD        Allergies:   Adhesive [tape]; Gabapentin (once-daily); Codeine; and Sulfonamide derivatives    Social History:  The patient  reports that she quit smoking about 27 years ago. Her smoking use included Cigarettes. She has a 24.00 pack-year smoking history. She has never used smokeless tobacco. She reports that she does not drink alcohol or use drugs.   Family History:  The patient's family history includes Diabetes in her father; Glaucoma in her father; Heart disease in her father; Kidney failure in her brother; Pancreatic cancer in her mother.    ROS:  Please see the history of present illness.   Otherwise, review of systems are positive for none.   All other systems are reviewed and negative.    PHYSICAL EXAM: VS:  BP 115/70   Pulse 77   Ht 5\' 3"  (1.6 m)   Wt 234 lb 3.2 oz (106.2 kg)   SpO2 98%   BMI 41.49 kg/m  , BMI Body mass index is 41.49 kg/m. GEN: Well nourished, obese,  in no acute distress  HEENT: normal  Neck: no JVD, carotid bruits, or masses Cardiac: RRR; no murmurs, rubs, or gallops,no edema  Respiratory:  clear to  auscultation bilaterally, normal work of breathing GI: soft, nontender, nondistended, + BS MS: no deformity or atrophy  Skin: warm and dry, no rash Neuro:  Strength and sensation are intact Psych: euthymic mood, full affect   EKG:  EKG is  Not ordered today.    Recent Labs: 03/20/2016: ALT 17 07/10/2016: BNP 51.4; Hemoglobin 12.6; Platelets 271 08/02/2016: BUN 15; Creatinine, Ser 0.90; Potassium 4.4; Sodium 142 08/12/2016: TSH 3.97    Lipid Panel    Component Value Date/Time   CHOL 165 03/20/2016 0836   TRIG (H) 03/20/2016 0836    419.0 Triglyceride is over 400; calculations on Lipids are invalid.   HDL 37.20 (L) 03/20/2016 0836   CHOLHDL 4 03/20/2016 0836   VLDL 18.6 02/02/2015 0901   LDLCALC 85 02/02/2015 0901   LDLDIRECT 75.0 03/20/2016 0836      Wt Readings from Last 3 Encounters:  10/17/16 234 lb 3.2 oz (106.2 kg)  07/15/16 234 lb (106.1 kg)  07/11/16 235 lb (106.6 kg)      Other studies Reviewed:   ASSESSMENT AND PLAN:  1.  Nonischemic cardiomyopathy with chronic systolic CHF. Class 1. Echo initially showed EF 30-35%. By MRI EF 44%. MRI is more accurate given body habitus. Repeat Echo in July showed EF 40-45% more consistent with MRI. Repeat Echo in February showed EF 40-45%.  Cardiac cath done showing normal coronary arteries. Normal pulmonary pressures. Mildly elevated LVEDP. Normal C.O. She is on optimal doses of lasix, aldactone, Coreg, and losartan. Will continue current therapy. Focus on lifestyle modification and weight loss.   Current medicines are reviewed at length with the patient today.  The patient does not have concerns regarding medicines.  The following changes have been made:  See above.  Labs/ tests ordered today include:  No orders of the defined types were placed in this encounter.    Disposition:   Follow up in 6 months  Signed, Fabricio Endsley [place], MD  10/17/2016 8:21 AM    [facility] [address], [place],  [place], [address] Phone [phone], Fax [phone]

## 2016-10-17 ENCOUNTER — Other Ambulatory Visit: Payer: Self-pay | Admitting: Cardiology

## 2016-10-17 ENCOUNTER — Ambulatory Visit (INDEPENDENT_AMBULATORY_CARE_PROVIDER_SITE_OTHER): Payer: 59 | Admitting: Cardiology

## 2016-10-17 ENCOUNTER — Encounter: Payer: Self-pay | Admitting: Cardiology

## 2016-10-17 ENCOUNTER — Ambulatory Visit: Payer: 59 | Admitting: Cardiology

## 2016-10-17 VITALS — BP 115/70 | HR 77 | Ht 63.0 in | Wt 234.2 lb

## 2016-10-17 DIAGNOSIS — I5042 Chronic combined systolic (congestive) and diastolic (congestive) heart failure: Secondary | ICD-10-CM | POA: Diagnosis not present

## 2016-10-17 DIAGNOSIS — I10 Essential (primary) hypertension: Secondary | ICD-10-CM

## 2016-10-17 DIAGNOSIS — I428 Other cardiomyopathies: Secondary | ICD-10-CM | POA: Diagnosis not present

## 2016-10-17 NOTE — Patient Instructions (Signed)
Continue your current therapy  I will see you in 6 months.   

## 2016-11-15 ENCOUNTER — Other Ambulatory Visit: Payer: Self-pay | Admitting: Cardiology

## 2016-11-20 ENCOUNTER — Other Ambulatory Visit: Payer: Self-pay | Admitting: Internal Medicine

## 2016-12-16 ENCOUNTER — Other Ambulatory Visit: Payer: Self-pay | Admitting: Internal Medicine

## 2016-12-16 ENCOUNTER — Other Ambulatory Visit: Payer: Self-pay | Admitting: Cardiology

## 2016-12-16 ENCOUNTER — Other Ambulatory Visit: Payer: Self-pay | Admitting: Family Medicine

## 2016-12-17 ENCOUNTER — Encounter: Payer: Self-pay | Admitting: Family Medicine

## 2016-12-17 ENCOUNTER — Ambulatory Visit (INDEPENDENT_AMBULATORY_CARE_PROVIDER_SITE_OTHER): Payer: 59 | Admitting: Family Medicine

## 2016-12-17 VITALS — BP 122/78 | HR 97 | Temp 98.1°F | Ht 63.0 in | Wt 226.8 lb

## 2016-12-17 DIAGNOSIS — R21 Rash and other nonspecific skin eruption: Secondary | ICD-10-CM | POA: Diagnosis not present

## 2016-12-17 DIAGNOSIS — B352 Tinea manuum: Secondary | ICD-10-CM

## 2016-12-17 LAB — POCT SKIN KOH: Skin KOH, POC: POSITIVE — AB

## 2016-12-17 MED ORDER — CLOTRIMAZOLE-BETAMETHASONE 1-0.05 % EX LOTN: TOPICAL_LOTION | Freq: Two times a day (BID) | CUTANEOUS | 0 refills | Status: DC

## 2016-12-17 NOTE — Progress Notes (Signed)
   Subjective:    Patient ID: TRINATI CIHLAR, female    DOB: 11-Oct-1960, 56 y.o.   MRN: [id number]  HPI    56 year old female presents with new onset rash on left foot, left thumb, left hand x 2 months, worsening, spreading. Started on left medial dorsal foot.. Flaky, red. In last week she has noted several flaky skin area on hands, mainly left, small on right. Itchy and stinging. Skin peeling off.   She has treated it with OTC essential oil, lamisil, vagisil etc.  No fever, no flu like symptoms. She does have chronic fatigue and fibromyalgia.   Review of Systems  Constitutional: Positive for fatigue. Negative for fever.  HENT: Negative for congestion.   Eyes: Negative for pain.  Respiratory: Negative for cough and shortness of breath.   Cardiovascular: Negative for chest pain, palpitations and leg swelling.  Gastrointestinal: Negative for abdominal pain.  Genitourinary: Negative for dysuria and vaginal bleeding.  Musculoskeletal: Negative for back pain.  Neurological: Negative for syncope, light-headedness and headaches.  Psychiatric/Behavioral: Negative for dysphoric mood.       Objective:   Physical Exam  Constitutional: Vital signs are normal. She appears well-developed and well-nourished. She is cooperative.  Non-toxic appearance. She does not appear ill. No distress.  HENT:  Head: Normocephalic.  Right Ear: Hearing, tympanic membrane, external ear and ear canal normal. Tympanic membrane is not erythematous, not retracted and not bulging.  Left Ear: Hearing, tympanic membrane, external ear and ear canal normal. Tympanic membrane is not erythematous, not retracted and not bulging.  Nose: No mucosal edema or rhinorrhea. Right sinus exhibits no maxillary sinus tenderness and no frontal sinus tenderness. Left sinus exhibits no maxillary sinus tenderness and no frontal sinus tenderness.  Mouth/Throat: Uvula is midline, oropharynx is clear and moist and mucous membranes are  normal.  Eyes: Pupils are equal, round, and reactive to light. Conjunctivae, EOM and lids are normal. Lids are everted and swept, no foreign bodies found.  Neck: Trachea normal and normal range of motion. Neck supple. Carotid bruit is not present. No thyroid mass and no thyromegaly present.  Cardiovascular: Normal rate, regular rhythm, S1 normal, S2 normal, normal heart sounds, intact distal pulses and normal pulses.  Exam reveals no gallop and no friction rub.   No murmur heard. Pulmonary/Chest: Effort normal and breath sounds normal. No tachypnea. No respiratory distress. She has no decreased breath sounds. She has no wheezes. She has no rhonchi. She has no rales.  Abdominal: Soft. Normal appearance and bowel sounds are normal. There is no tenderness.  Neurological: She is alert.  Skin: Skin is warm, dry and intact. No rash noted.  Flaky erythematous skin with leading edge on left medial foot, several petechia , unblanching nearby.  Flaky , peeling rash on left hand, minimal on right.  Psychiatric: Her speech is normal and behavior is normal. Judgment and thought content normal. Her mood appears not anxious. Cognition and memory are normal. She does not exhibit a depressed mood.          Assessment & Plan:

## 2016-12-17 NOTE — Patient Instructions (Signed)
Apply to affected area twice daily x 4 weeks.. Call if not starting to improve in next 2 weeks.  Treat shows weekly with antifungal spray as well.

## 2016-12-17 NOTE — Assessment & Plan Note (Signed)
Treat tinea pedis and manus with topical antifungal as well as steroid cream given significant itching.  Treat shoes for spores as well.  4 week course.

## 2016-12-20 ENCOUNTER — Other Ambulatory Visit: Payer: Self-pay | Admitting: Internal Medicine

## 2016-12-25 ENCOUNTER — Other Ambulatory Visit: Payer: Self-pay

## 2016-12-25 NOTE — Telephone Encounter (Signed)
F/u pain management needed for Kristine Curtis STOP ACT

## 2016-12-25 NOTE — Telephone Encounter (Signed)
Myrella notified as instructed by telephone.  Appointment scheduled for 12/30/2016 at 3:45 pm with Dr. Patsy Lager.

## 2016-12-25 NOTE — Telephone Encounter (Signed)
Pt left v/m requesting rx hydrocodone apap. Call when ready for pick up. rx last printed # 60 on 09/17/16; pt last seen for annual 03/27/16. Pt has next spinal inj scheduled for 01/17/17.

## 2016-12-30 ENCOUNTER — Encounter: Payer: Self-pay | Admitting: Family Medicine

## 2016-12-30 ENCOUNTER — Ambulatory Visit (INDEPENDENT_AMBULATORY_CARE_PROVIDER_SITE_OTHER): Payer: 59 | Admitting: Family Medicine

## 2016-12-30 VITALS — BP 100/78 | HR 94 | Temp 98.7°F | Ht 63.25 in | Wt 228.8 lb

## 2016-12-30 DIAGNOSIS — M961 Postlaminectomy syndrome, not elsewhere classified: Secondary | ICD-10-CM | POA: Diagnosis not present

## 2016-12-30 DIAGNOSIS — G8929 Other chronic pain: Secondary | ICD-10-CM

## 2016-12-30 DIAGNOSIS — M5440 Lumbago with sciatica, unspecified side: Secondary | ICD-10-CM | POA: Diagnosis not present

## 2016-12-30 MED ORDER — HYDROCODONE-ACETAMINOPHEN 5-325 MG PO TABS: 1.0000 | ORAL_TABLET | Freq: Three times a day (TID) | ORAL | 0 refills | Status: DC | PRN

## 2016-12-30 NOTE — Progress Notes (Signed)
Dr. Karleen Hampshire T. Erynne Kealey, MD, CAQ [facility] [address] [place], [address] Phone: [phone] Fax: [phone]  12/30/2016  Patient: Kristine Curtis, MRN: [id number], DOB: [birth year], 56 y.o.  Primary Physician:  [name], MD   Chief Complaint  Patient presents with  . Pain Management   Subjective:   Kristine Curtis is a 56 y.o. very pleasant female patient who presents with the following:  F/u pain: Chronic back pain, failed spine syndrome, fibromyalgia. Patient rarely uses her pain medication, and it depends on her pain.  She recently had a fall and has had quite a bit more pain  Indication for chronic opioid: chronic pain, chronic back pain, failed spine syndrome Medication and dose: Norco 5 # pills per month: Usually < 30 Last UDS date: Due Pain contract signed (Y/N): y Date narcotic database last reviewed (include red flags): 12/30/2016   UDS  Past Medical History, Surgical History, Social History, Family History, Problem List, Medications, and Allergies have been reviewed and updated if relevant.  Patient Active Problem List   Diagnosis Date Noted  . Chronic systolic congestive heart failure (HCC) 03/22/2015    Priority: High  . Hypothyroidism 08/30/2008    Priority: High  . Primary fibromyalgia syndrome 08/30/2008    Priority: High  . Sicca (HCC) 11/22/2014    Priority: Medium  . Chronic low back pain with sciatica 05/02/2015  . Obesity (BMI 30-39.9) 03/22/2015  . Family history of polycystic kidney 11/16/2012  . UNSPECIFIED VITAMIN D DEFICIENCY 10/09/2009  . Other and unspecified hyperlipidemia 11/15/2008  . Major depression in complete remission (HCC) 08/31/2008  . Essential hypertension 08/31/2008  . ALLERGIC RHINITIS 08/31/2008  . ASTHMA 08/31/2008  . GERD 08/31/2008  . Osteoarthritis 08/31/2008  . Classical migraine without intractable migraine 08/30/2008    Past Medical History:    Diagnosis Date  . Allergic rhinitis due to pollen   . Asthma   . Chronic combined systolic and diastolic CHF (congestive heart failure) (HCC)    a. 06/2016 Echo: EF 40-45%, Gr1 DD.  Marland Kitchen Depression   . Family history of polycystic kidney 11/16/2012   neg CT  . Fibromyalgia   . GERD (gastroesophageal reflux disease)   . Hypertension   . Hypothyroidism   . IBS (irritable bowel syndrome)   . Internal hemorrhoids   . Morbid obesity (HCC)   . NICM (nonischemic cardiomyopathy) (HCC)    a. 1999 Cath: nl cors;  b. EF prev as low as 35%;  c. 11/2015 Echo: Ef 40-45%;  d. 06/2016 Echo: EF 40-45%;  e. Lexiscan MV: fixed anterosepta/inferseptal defect w/o ischemia, EF 35%.  . Osteoarthritis   . PVC (premature ventricular contraction)   . Restless leg syndrome   . Sicca (HCC) 11/22/2014  . Vertigo     Past Surgical History:  Procedure Laterality Date  . ABDOMINAL HYSTERECTOMY    . CESAREAN SECTION    . CHOLECYSTECTOMY  10-2008  . COLONOSCOPY    . ESOPHAGOGASTRODUODENOSCOPY    . EYE SURGERY     2 2013 and 1981/DCR of right eye 05/2014  . LUMBAR DISC SURGERY  2016   L3-4  . RIGHT/LEFT HEART CATH AND CORONARY ANGIOGRAPHY N/A 07/11/2016   Procedure: Right/Left Heart Cath and Coronary Angiography;  Surgeon: Peter M [place]il[place], MD;  Location: [facility] INVASIVE CV LAB;  Service: Cardiovascular;  Laterality: N/A;  . SHOULDER ARTHROSCOPY W/ SUBACROMIAL DECOMPRESSION AND DISTAL CLAVICLE EXCISION Right    rotator cuff repair  .  TUBAL LIGATION  1992    Social History   Social History  . Marital status: Married    Spouse name: N/A  . Number of children: 2  . Years of education: N/A   Occupational History  . quality assurance tech    Social History Main Topics  . Smoking status: Former Smoker    Packs/day: 2.00    Years: 12.00    Types: Cigarettes    Quit date: 05/06/1989  . Smokeless tobacco: Never Used     Comment: quit smoking 1990  . Alcohol use No  . Drug use: No  . Sexual activity: Not on file    Other Topics Concern  . Not on file   Social History Narrative   Former Dr. Andrey Campanile then Dr. 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 Pancreatic cancer Mother   . Diabetes Father   . Heart disease Father   . Glaucoma Father   . Kidney failure Brother     Allergies  Allergen Reactions  . Adhesive [Tape]     Redness   . Gabapentin (Once-Daily)     Depression   . Codeine Itching  . Sulfonamide Derivatives Itching    Medication list reviewed and updated in full in [facility].   GEN: No acute illnesses, no fevers, chills. GI: No n/v/d, eating normally Pulm: No SOB Interactive and getting along well at home.  Otherwise, ROS is as per the HPI.  Objective:   BP 100/78   Pulse 94   Temp 98.7 F (37.1 C) (Oral)   Ht 5' 3.25" (1.607 m)   Wt 228 lb 12 oz (103.8 kg)   BMI 40.20 kg/m   GEN: WDWN, NAD, Non-toxic, A & O x 3 HEENT: Atraumatic, Normocephalic. Neck supple. No masses, No LAD. Ears and Nose: No external deformity. CV: RRR, No M/G/R. No JVD. No thrill. No extra heart sounds. PULM: CTA B, no wheezes, crackles, rhonchi. No retractions. No resp. distress. No accessory muscle use. EXTR: No c/c/e NEURO Normal gait.  PSYCH: Normally interactive. Conversant. Not depressed or anxious appearing.  Calm demeanor.   Laboratory and Imaging Data:  Assessment and Plan:   Chronic low back pain with sciatica, sciatica laterality unspecified, unspecified back pain laterality  Failed back syndrome of lumbar spine  Reviewed new STOP laws, check UDS.   Follow-up: 6 months  Meds ordered this encounter  Medications  . HYDROcodone-acetaminophen (NORCO/VICODIN) 5-325 MG tablet    Sig: Take 1 tablet by mouth 3 (three) times daily as needed for moderate pain.    Dispense:  60 tablet    Refill:  0  . OVER THE COUNTER MEDICATION    Sig: Ariix Optimals Vitamin & Minerals  . OVER THE COUNTER MEDICATION     Sig: Ariix Magnecal D   Medications Discontinued During This Encounter  Medication Reason  . omeprazole (PRILOSEC) 40 MG capsule Duplicate  . HYDROcodone-acetaminophen (NORCO/VICODIN) 5-325 MG tablet Reorder   Signed,  Karleen Hampshire T. Krishauna Schatzman, MD   Patient's Medications  New Prescriptions   No medications on file  Previous Medications   ACETAMINOPHEN (TYLENOL) 500 MG TABLET    Take 1,000 mg by mouth every 4 (four) hours as needed for moderate pain or headache.   ASPIRIN-ACETAMINOPHEN-CAFFEINE (EXCEDRIN EXTRA STRENGTH PO)    Take 2 tablets by mouth 2 (two) times daily as needed (headaches).   BUPROPION (WELLBUTRIN XL) 300  MG 24 HR TABLET    Take 1 tablet (300 mg total) by mouth daily.   CARVEDILOL (COREG) 25 MG TABLET    Take 1.5 tablets (37.5 mg total) by mouth 2 (two) times daily with a meal.   CEVIMELINE (EVOXAC) 30 MG CAPSULE    Take 30 mg by mouth 3 (three) times daily as needed (dry mouth).   CHOLECALCIFEROL (VITAMIN D-3) 5000 UNITS TABS    Take 5,000 Units by mouth daily.    CLOTRIMAZOLE-BETAMETHASONE (LOTRISONE) LOTION    Apply topically 2 (two) times daily.   CYCLOBENZAPRINE (FLEXERIL) 5 MG TABLET    Take 5 mg by mouth 2 (two) times daily.    DIAZEPAM (VALIUM) 5 MG TABLET    TAKE ONE (1) TABLET BY MOUTH EVERY SIX HOURS AS NEEDED   DOCUSATE SODIUM (COLACE PO)    Take 1 tablet by mouth as needed (CONSTIPATION).    FUROSEMIDE (LASIX) 20 MG TABLET    TAKE 1 TABLET BY MOUTH DAILY   HYDROXYCHLOROQUINE (PLAQUENIL) 200 MG TABLET    Take 400 mg by mouth every morning.    LEVOTHYROXINE (SYNTHROID, LEVOTHROID) 88 MCG TABLET    TAKE 1 TABLET BY MOUTH DAILY   LOSARTAN (COZAAR) 100 MG TABLET    TAKE 1 TABLET BY MOUTH DAILY   MELOXICAM (MOBIC) 7.5 MG TABLET    TAKE 1 TABLET BY MOUTH DAILY   OMEPRAZOLE (PRILOSEC) 40 MG CAPSULE    TAKE 1 CAPSULE BY MOUTH BEFORE BREAKFAST   ONE TOUCH ULTRA TEST TEST STRIP    USE AS DIRECTED TO CHECK BLOOD SUGAR ONCE A DAY   OVER THE COUNTER MEDICATION    Ariix  Optimals Vitamin & Minerals   OVER THE COUNTER MEDICATION    Ariix Magnecal D   OXYCODONE-ACETAMINOPHEN (PERCOCET/ROXICET) 5-325 MG TABLET    Take 1 tablet by mouth daily as needed for severe pain.    SPIRONOLACTONE (ALDACTONE) 25 MG TABLET    Take 1 tablet (25 mg total) by mouth daily.   TRAMADOL (ULTRAM) 50 MG TABLET    Take 0.5 tablets (25 mg total) by mouth every 6 (six) hours as needed.   WHEAT DEXTRIN (BENEFIBER) POWD    Take 2 scoop by mouth daily as needed (for constipation).    XIIDRA 5 % SOLN    Place 1 drop into both eyes daily as needed (dry eyes).   Modified Medications   Modified Medication Previous Medication   HYDROCODONE-ACETAMINOPHEN (NORCO/VICODIN) 5-325 MG TABLET HYDROcodone-acetaminophen (NORCO/VICODIN) 5-325 MG tablet      Take 1 tablet by mouth 3 (three) times daily as needed for moderate pain.    Take 1 tablet by mouth 3 (three) times daily as needed for moderate pain.  Discontinued Medications   OMEPRAZOLE (PRILOSEC) 40 MG CAPSULE    TAKE 1 CAPSULE BY MOUTH BEFORE BREAKFAST

## 2017-01-13 ENCOUNTER — Telehealth: Payer: Self-pay | Admitting: Cardiology

## 2017-01-13 ENCOUNTER — Telehealth: Payer: Self-pay | Admitting: Internal Medicine

## 2017-01-13 NOTE — Telephone Encounter (Signed)
Patient called that she was told to come to the ED to get evaluated and she cannot sit through the waiting time of the ED. She also had multitude of myriad complaints which she explained over the phone. After prolonged discussion it was decided that she will wait and will take her pain medications to get through the waiting time. She was told that it is recommended for her to be evaluated by a physician. She understood it and will consider it.

## 2017-01-13 NOTE — Telephone Encounter (Signed)
Returned call to patient of Dr. Swaziland - chronic systolic CHF  Over the past couple of weeks, she has noticed indentions in her lower legs with any pressure from edema. She reports she still will suddenly feel faint and her arms/legs get weak/heavy. She states her hands/feet will "fall asleep". She had a spell yesterday and she blacked out after her legs/arms got heavy and the room was spinning. She was "out" momentarily but felt nauseous after. She states it felt like needles were sticking in her eyes. She had a similar episode today but did not black out.   She also reports weight gain from Saturday 226lbs to Sunday 229lbs and today 227.5lbs  She also has Sjgren's syndrome  She follows a low-sodium diet  Informed her that I would route message to Dr. Jens Som (DOD) to review and advise

## 2017-01-13 NOTE — Telephone Encounter (Signed)
New Message   Wants to talk to cheryl  Pt c/o swelling: STAT is pt has developed SOB within 24 hours  1. How long have you been experiencing swelling?  Saturday to sunday  2. Where is the swelling located? Gained 3lb ankles   3.  Are you currently taking a "fluid pill"? Yes   4.  Are you currently SOB?  Off and on , not every day , comes and goes   5.  Have you traveled recently? No   Had a black out spell, weakness, fatigue , heavy arm and legs

## 2017-01-13 NOTE — Telephone Encounter (Signed)
If pt had syncope, she should be evaluated in ER (she should not drive) Kristine Curtis

## 2017-01-13 NOTE — Telephone Encounter (Signed)
Patient aware of MD recommendations and will proceed to [facility] ED. Her husband will drive her. LM for Daun Peacock (cardmaster) and notified Rafael Capo nurse first, [facility] RN

## 2017-01-14 ENCOUNTER — Emergency Department (HOSPITAL_COMMUNITY): Payer: 59

## 2017-01-14 ENCOUNTER — Telehealth: Payer: Self-pay | Admitting: Cardiology

## 2017-01-14 ENCOUNTER — Observation Stay (HOSPITAL_COMMUNITY)
Admission: EM | Admit: 2017-01-14 | Discharge: 2017-01-15 | Disposition: A | Discharge status: Home / Self Care | Payer: 59 | Attending: Family Medicine | Admitting: Family Medicine

## 2017-01-14 ENCOUNTER — Observation Stay (HOSPITAL_COMMUNITY): Payer: 59

## 2017-01-14 ENCOUNTER — Encounter (HOSPITAL_COMMUNITY): Payer: Self-pay | Admitting: *Deleted

## 2017-01-14 DIAGNOSIS — Z87891 Personal history of nicotine dependence: Secondary | ICD-10-CM | POA: Insufficient documentation

## 2017-01-14 DIAGNOSIS — K580 Irritable bowel syndrome with diarrhea: Secondary | ICD-10-CM | POA: Insufficient documentation

## 2017-01-14 DIAGNOSIS — I5022 Chronic systolic (congestive) heart failure: Secondary | ICD-10-CM

## 2017-01-14 DIAGNOSIS — I428 Other cardiomyopathies: Secondary | ICD-10-CM | POA: Diagnosis present

## 2017-01-14 DIAGNOSIS — E038 Other specified hypothyroidism: Secondary | ICD-10-CM

## 2017-01-14 DIAGNOSIS — G2581 Restless legs syndrome: Secondary | ICD-10-CM | POA: Insufficient documentation

## 2017-01-14 DIAGNOSIS — K219 Gastro-esophageal reflux disease without esophagitis: Secondary | ICD-10-CM | POA: Diagnosis not present

## 2017-01-14 DIAGNOSIS — J45909 Unspecified asthma, uncomplicated: Secondary | ICD-10-CM | POA: Diagnosis not present

## 2017-01-14 DIAGNOSIS — E039 Hypothyroidism, unspecified: Secondary | ICD-10-CM | POA: Diagnosis not present

## 2017-01-14 DIAGNOSIS — E559 Vitamin D deficiency, unspecified: Secondary | ICD-10-CM | POA: Diagnosis not present

## 2017-01-14 DIAGNOSIS — F325 Major depressive disorder, single episode, in full remission: Secondary | ICD-10-CM | POA: Diagnosis not present

## 2017-01-14 DIAGNOSIS — R55 Syncope and collapse: Principal | ICD-10-CM

## 2017-01-14 DIAGNOSIS — E785 Hyperlipidemia, unspecified: Secondary | ICD-10-CM | POA: Insufficient documentation

## 2017-01-14 DIAGNOSIS — R079 Chest pain, unspecified: Secondary | ICD-10-CM | POA: Diagnosis not present

## 2017-01-14 DIAGNOSIS — Z79899 Other long term (current) drug therapy: Secondary | ICD-10-CM | POA: Diagnosis not present

## 2017-01-14 DIAGNOSIS — Z6841 Body Mass Index (BMI) 40.0 and over, adult: Secondary | ICD-10-CM | POA: Insufficient documentation

## 2017-01-14 DIAGNOSIS — F418 Other specified anxiety disorders: Secondary | ICD-10-CM | POA: Diagnosis present

## 2017-01-14 DIAGNOSIS — Z7982 Long term (current) use of aspirin: Secondary | ICD-10-CM | POA: Diagnosis not present

## 2017-01-14 DIAGNOSIS — M797 Fibromyalgia: Secondary | ICD-10-CM | POA: Diagnosis not present

## 2017-01-14 DIAGNOSIS — M3500 Sicca syndrome, unspecified: Secondary | ICD-10-CM | POA: Insufficient documentation

## 2017-01-14 DIAGNOSIS — I10 Essential (primary) hypertension: Secondary | ICD-10-CM | POA: Diagnosis present

## 2017-01-14 DIAGNOSIS — I110 Hypertensive heart disease with heart failure: Secondary | ICD-10-CM | POA: Diagnosis not present

## 2017-01-14 DIAGNOSIS — I5042 Chronic combined systolic (congestive) and diastolic (congestive) heart failure: Secondary | ICD-10-CM | POA: Insufficient documentation

## 2017-01-14 DIAGNOSIS — R2689 Other abnormalities of gait and mobility: Secondary | ICD-10-CM | POA: Insufficient documentation

## 2017-01-14 LAB — CBC
HCT: 38.4 % (ref 36.0–46.0)
Hemoglobin: 12.7 g/dL (ref 12.0–15.0)
MCH: 30.5 pg (ref 26.0–34.0)
MCHC: 33.1 g/dL (ref 30.0–36.0)
MCV: 92.3 fL (ref 78.0–100.0)
Platelets: 239 10*3/uL (ref 150–400)
RBC: 4.16 MIL/uL (ref 3.87–5.11)
RDW: 13.4 % (ref 11.5–15.5)
WBC: 5.4 10*3/uL (ref 4.0–10.5)

## 2017-01-14 LAB — BASIC METABOLIC PANEL
Anion gap: 9 (ref 5–15)
BUN: 13 mg/dL (ref 6–20)
CO2: 25 mmol/L (ref 22–32)
Calcium: 9.4 mg/dL (ref 8.9–10.3)
Chloride: 105 mmol/L (ref 101–111)
Creatinine, Ser: 0.91 mg/dL (ref 0.44–1.00)
GFR calc Af Amer: >60 mL/min (ref >60)
GFR calc non Af Amer: >60 mL/min (ref >60)
Glucose, Bld: 104 mg/dL — ABNORMAL HIGH (ref 65–99)
Potassium: 3.9 mmol/L (ref 3.5–5.1)
Sodium: 139 mmol/L (ref 135–145)

## 2017-01-14 LAB — I-STAT TROPONIN, ED: Troponin i, poc: 0 ng/mL (ref 0.00–0.08)

## 2017-01-14 LAB — TSH: TSH: 3.036 u[IU]/mL (ref 0.350–4.500)

## 2017-01-14 LAB — TROPONIN I: Troponin I: <0.03 ng/mL (ref <0.03)

## 2017-01-14 LAB — D-DIMER, QUANTITATIVE: D-Dimer, Quant: 0.41 ug/mL-FEU (ref 0.00–0.50)

## 2017-01-14 MED ORDER — HYDROXYCHLOROQUINE SULFATE 200 MG PO TABS
400.0000 mg | ORAL_TABLET | ORAL | Status: DC
  Administered 2017-01-15: 400 mg via ORAL
  Filled 2017-01-14: qty 2

## 2017-01-14 MED ORDER — ALUM & MAG HYDROXIDE-SIMETH 200-200-20 MG/5ML PO SUSP: 30.0000 mL | Freq: Four times a day (QID) | ORAL | Status: DC | PRN

## 2017-01-14 MED ORDER — TRAMADOL HCL 50 MG PO TABS: 25.0000 mg | ORAL_TABLET | Freq: Four times a day (QID) | ORAL | Status: DC | PRN

## 2017-01-14 MED ORDER — VITAMIN D 1000 UNITS PO TABS
5000.0000 [IU] | ORAL_TABLET | Freq: Every day | ORAL | Status: DC
  Administered 2017-01-15: 5000 [IU] via ORAL
  Filled 2017-01-14: qty 5

## 2017-01-14 MED ORDER — CLOTRIMAZOLE 1 % EX CREA
TOPICAL_CREAM | Freq: Two times a day (BID) | CUTANEOUS | Status: DC
  Filled 2017-01-14: qty 15

## 2017-01-14 MED ORDER — SENNOSIDES-DOCUSATE SODIUM 8.6-50 MG PO TABS: 1.0000 | ORAL_TABLET | Freq: Every evening | ORAL | Status: DC | PRN

## 2017-01-14 MED ORDER — FUROSEMIDE 20 MG PO TABS
20.0000 mg | ORAL_TABLET | Freq: Every day | ORAL | Status: DC
  Administered 2017-01-15: 20 mg via ORAL
  Filled 2017-01-14: qty 1

## 2017-01-14 MED ORDER — CARVEDILOL 25 MG PO TABS
25.0000 mg | ORAL_TABLET | Freq: Two times a day (BID) | ORAL | Status: DC
  Administered 2017-01-15: 25 mg via ORAL
  Filled 2017-01-14: qty 1

## 2017-01-14 MED ORDER — SODIUM CHLORIDE 0.9% FLUSH
3.0000 mL | Freq: Two times a day (BID) | INTRAVENOUS | Status: DC
  Administered 2017-01-14: 3 mL via INTRAVENOUS

## 2017-01-14 MED ORDER — LIFITEGRAST 5 % OP SOLN: 1.0000 [drp] | Freq: Every day | OPHTHALMIC | Status: DC | PRN

## 2017-01-14 MED ORDER — LOSARTAN POTASSIUM 50 MG PO TABS
100.0000 mg | ORAL_TABLET | Freq: Every day | ORAL | Status: DC
  Administered 2017-01-15: 100 mg via ORAL
  Filled 2017-01-14: qty 2

## 2017-01-14 MED ORDER — OXYCODONE-ACETAMINOPHEN 5-325 MG PO TABS: 1.0000 | ORAL_TABLET | Freq: Every day | ORAL | Status: DC | PRN

## 2017-01-14 MED ORDER — ASPIRIN-ACETAMINOPHEN-CAFFEINE 250-250-65 MG PO TABS
2.0000 | ORAL_TABLET | Freq: Two times a day (BID) | ORAL | Status: DC | PRN
  Filled 2017-01-14: qty 2

## 2017-01-14 MED ORDER — SODIUM CHLORIDE 0.9 % IV SOLN
INTRAVENOUS | Status: DC
  Administered 2017-01-14: 22:00:00 via INTRAVENOUS

## 2017-01-14 MED ORDER — ENOXAPARIN SODIUM 40 MG/0.4ML ~~LOC~~ SOLN
40.0000 mg | SUBCUTANEOUS | Status: DC
  Administered 2017-01-14: 40 mg via SUBCUTANEOUS
  Filled 2017-01-14: qty 0.4

## 2017-01-14 MED ORDER — SPIRONOLACTONE 25 MG PO TABS
25.0000 mg | ORAL_TABLET | Freq: Every day | ORAL | Status: DC
  Administered 2017-01-15: 25 mg via ORAL
  Filled 2017-01-14: qty 1

## 2017-01-14 MED ORDER — CEVIMELINE HCL 30 MG PO CAPS: 30.0000 mg | ORAL_CAPSULE | Freq: Every day | ORAL | Status: DC

## 2017-01-14 MED ORDER — SENNA 8.6 MG PO TABS
1.0000 | ORAL_TABLET | Freq: Two times a day (BID) | ORAL | Status: DC
  Administered 2017-01-15: 8.6 mg via ORAL
  Filled 2017-01-14: qty 1

## 2017-01-14 MED ORDER — CLOTRIMAZOLE-BETAMETHASONE 1-0.05 % EX LOTN: TOPICAL_LOTION | Freq: Two times a day (BID) | CUTANEOUS | Status: DC

## 2017-01-14 MED ORDER — CYCLOBENZAPRINE HCL 5 MG PO TABS
5.0000 mg | ORAL_TABLET | Freq: Two times a day (BID) | ORAL | Status: DC
  Administered 2017-01-14 – 2017-01-15 (×2): 5 mg via ORAL
  Filled 2017-01-14 (×2): qty 1

## 2017-01-14 MED ORDER — BENEFIBER PO POWD: 2.0000 | Freq: Every day | ORAL | Status: DC | PRN

## 2017-01-14 MED ORDER — LEVOTHYROXINE SODIUM 88 MCG PO TABS
88.0000 ug | ORAL_TABLET | Freq: Every day | ORAL | Status: DC
  Administered 2017-01-15: 88 ug via ORAL
  Filled 2017-01-14: qty 1

## 2017-01-14 MED ORDER — MELOXICAM 7.5 MG PO TABS
7.5000 mg | ORAL_TABLET | Freq: Every day | ORAL | Status: DC
  Administered 2017-01-15: 7.5 mg via ORAL
  Filled 2017-01-14: qty 1

## 2017-01-14 MED ORDER — HYDROCODONE-ACETAMINOPHEN 5-325 MG PO TABS
1.0000 | ORAL_TABLET | Freq: Three times a day (TID) | ORAL | Status: DC | PRN
  Administered 2017-01-14 – 2017-01-15 (×2): 1 via ORAL
  Filled 2017-01-14 (×2): qty 1

## 2017-01-14 MED ORDER — BUPROPION HCL ER (XL) 300 MG PO TB24
300.0000 mg | ORAL_TABLET | Freq: Every day | ORAL | Status: DC
  Administered 2017-01-15: 300 mg via ORAL
  Filled 2017-01-14: qty 1

## 2017-01-14 MED ORDER — ONDANSETRON HCL 4 MG/2ML IJ SOLN: 4.0000 mg | Freq: Four times a day (QID) | INTRAMUSCULAR | Status: DC | PRN

## 2017-01-14 MED ORDER — ACETAMINOPHEN 500 MG PO TABS: 1000.0000 mg | ORAL_TABLET | ORAL | Status: DC | PRN

## 2017-01-14 MED ORDER — ONDANSETRON HCL 4 MG PO TABS
4.0000 mg | ORAL_TABLET | Freq: Four times a day (QID) | ORAL | Status: DC | PRN
Start: 2017-01-14 — End: 2017-01-15

## 2017-01-14 MED ORDER — DIAZEPAM 5 MG PO TABS
5.0000 mg | ORAL_TABLET | Freq: Four times a day (QID) | ORAL | Status: DC | PRN
  Administered 2017-01-14 – 2017-01-15 (×2): 5 mg via ORAL
  Filled 2017-01-14 (×2): qty 1

## 2017-01-14 MED ORDER — SORBITOL 70 % SOLN: 30.0000 mL | Freq: Every day | Status: DC | PRN

## 2017-01-14 MED ORDER — PANTOPRAZOLE SODIUM 40 MG PO TBEC
80.0000 mg | DELAYED_RELEASE_TABLET | Freq: Every day | ORAL | Status: DC
  Administered 2017-01-15: 80 mg via ORAL
  Filled 2017-01-14: qty 2

## 2017-01-14 NOTE — ED Triage Notes (Signed)
Pt states on Saturday to Sunday she gained 3 pounds and on Sunday she passed out.  Pt has been having dizziness and lightheadedness since February.  Seen Dr. Sw[place]iland cardiologist and found nothing acute. Pt had an original diagnosis of cardiomyopathy and then chf.  She states her EF was 30-35% and since medications she is up to 40%.

## 2017-01-14 NOTE — ED Provider Notes (Signed)
[facility]-EMERGENCY DEPT Provider Note   CSN: [id number] Arrival date & time: 01/14/17  0944     History   Chief Complaint Chief Complaint  Patient presents with  . Loss of Consciousness    HPI Kristine Curtis is a 56 y.o. female.patient suffered syncopal event on 01/12/2017 and near syncopal event yesterday immediately prior to both episode she felt briefly lightheaded. She was at rest for both episodes. She feels a "squeezing feeling in her chest which has been constant since this morning.."nothing makes symptoms better or worse. No treatment prior to coming here. She called her cardiologist yesterday who advised her to come to the emergency department for evaluation however , she did not get evaluated yesterday  HPI  Past Medical History:  Diagnosis Date  . Allergic rhinitis due to pollen   . Asthma   . Chronic combined systolic and diastolic CHF (congestive heart failure) (HCC)    a. 06/2016 Echo: EF 40-45%, Gr1 DD.  Marland Kitchen Depression   . Family history of polycystic kidney 11/16/2012   neg CT  . Fibromyalgia   . GERD (gastroesophageal reflux disease)   . Hypertension   . Hypothyroidism   . IBS (irritable bowel syndrome)   . Internal hemorrhoids   . Morbid obesity (HCC)   . NICM (nonischemic cardiomyopathy) (HCC)    a. 1999 Cath: nl cors;  b. EF prev as low as 35%;  c. 11/2015 Echo: Ef 40-45%;  d. 06/2016 Echo: EF 40-45%;  e. Lexiscan MV: fixed anterosepta/inferseptal defect w/o ischemia, EF 35%.  . Osteoarthritis   . PVC (premature ventricular contraction)   . Restless leg syndrome   . Sicca (HCC) 11/22/2014  . Vertigo     Patient Active Problem List   Diagnosis Date Noted  . Failed back syndrome of lumbar spine 12/30/2016  . Chronic low back pain with sciatica 05/02/2015  . Chronic systolic congestive heart failure (HCC) 03/22/2015  . Obesity (BMI 30-39.9) 03/22/2015  . Sicca (HCC) 11/22/2014  . Family history of polycystic kidney 11/16/2012  . UNSPECIFIED VITAMIN D  DEFICIENCY 10/09/2009  . Other and unspecified hyperlipidemia 11/15/2008  . Major depression in complete remission (HCC) 08/31/2008  . Essential hypertension 08/31/2008  . ALLERGIC RHINITIS 08/31/2008  . ASTHMA 08/31/2008  . GERD 08/31/2008  . Osteoarthritis 08/31/2008  . Hypothyroidism 08/30/2008  . Classical migraine without intractable migraine 08/30/2008  . Primary fibromyalgia syndrome 08/30/2008    Past Surgical History:  Procedure Laterality Date  . ABDOMINAL HYSTERECTOMY    . CESAREAN SECTION    . CHOLECYSTECTOMY  10-2008  . COLONOSCOPY    . ESOPHAGOGASTRODUODENOSCOPY    . EYE SURGERY     2 2013 and 1981/DCR of right eye 05/2014  . LUMBAR DISC SURGERY  2016   L3-4  . RIGHT/LEFT HEART CATH AND CORONARY ANGIOGRAPHY N/A 07/11/2016   Procedure: Right/Left Heart Cath and Coronary Angiography;  Surgeon: Peter M Swaziland, MD;  Location: [facility] INVASIVE CV LAB;  Service: Cardiovascular;  Laterality: N/A;  . SHOULDER ARTHROSCOPY W/ SUBACROMIAL DECOMPRESSION AND DISTAL CLAVICLE EXCISION Right    rotator cuff repair  . TUBAL LIGATION  1992    OB History    No data available       Home Medications    Prior to Admission medications   Medication Sig Start Date End Date Taking? Authorizing Provider  acetaminophen (TYLENOL) 500 MG tablet Take 1,000 mg by mouth every 4 (four) hours as needed for moderate pain or headache.   Yes [provider]  Aspirin-Acetaminophen-Caffeine (EXCEDRIN EXTRA STRENGTH PO) Take 2 tablets by mouth 2 (two) times daily as needed (headaches).   Yes [provider]  buPROPion (WELLBUTRIN XL) 300 MG 24 hr tablet Take 1 tablet (300 mg total) by mouth daily. 04/25/16  Yes Copland, Karleen Hampshire, MD  carvedilol (COREG) 25 MG tablet Take 1.5 tablets (37.5 mg total) by mouth 2 (two) times daily with a meal. Patient taking differently: Take 25 mg by mouth 2 (two) times daily with a meal.  07/17/16  Yes Sw[place]il[place], Peter M, MD  cevimeline [facility]) 30 MG capsule  Take 30 mg by mouth daily.    Yes [provider]  Cholecalciferol (VITAMIN D-3) 5000 UNITS TABS Take 5,000 Units by mouth daily.    Yes [provider]  clotrimazole-betamethasone (LOTRISONE) lotion Apply topically 2 (two) times daily. 12/17/16  Yes Bedsole, Amy E, MD  cyclobenzaprine (FLEXERIL) 5 MG tablet Take 5 mg by mouth 2 (two) times daily.  08/09/15  Yes [provider]  diazepam (VALIUM) 5 MG tablet TAKE ONE (1) TABLET BY MOUTH EVERY SIX HOURS AS NEEDED 07/13/16  Yes Copland, Karleen Hampshire, MD  Docusate Sodium (COLACE PO) Take 1 tablet by mouth as needed (CONSTIPATION).    Yes [provider]  furosemide (LASIX) 20 MG tablet TAKE 1 TABLET BY MOUTH DAILY 11/18/16  Yes Robbie Lis M, PA-C  HYDROcodone-acetaminophen (NORCO/VICODIN) 5-325 MG tablet Take 1 tablet by mouth 3 (three) times daily as needed for moderate pain. 12/30/16  Yes Copland, Karleen Hampshire, MD  hydroxychloroquine (PLAQUENIL) 200 MG tablet Take 400 mg by mouth every morning.  06/07/15  Yes [provider]  levothyroxine (SYNTHROID, LEVOTHROID) 88 MCG tablet TAKE 1 TABLET BY MOUTH DAILY 12/16/16  Yes Copland, Karleen Hampshire, MD  losartan (COZAAR) 100 MG tablet TAKE 1 TABLET BY MOUTH DAILY 12/16/16  Yes Sharol Harness, Brittainy M, PA-C  meloxicam (MOBIC) 7.5 MG tablet TAKE 1 TABLET BY MOUTH DAILY 08/12/16  Yes Copland, Karleen Hampshire, MD  omeprazole (PRILOSEC) 40 MG capsule TAKE 1 CAPSULE BY MOUTH BEFORE BREAKFAST 12/20/16  Yes Iva Boop, MD  ONE TOUCH ULTRA TEST test strip USE AS DIRECTED TO CHECK BLOOD SUGAR ONCE A DAY 07/12/16  Yes Copland, Karleen Hampshire, MD  OVER THE COUNTER MEDICATION Take 5 capsules by mouth daily. [employer]ix Optimals Vitamin & Minerals    Yes [provider]  OVER THE COUNTER MEDICATION Take 1 scoop by mouth daily. Ariix Magnecal D    Yes [provider]  oxyCODONE-acetaminophen (PERCOCET/ROXICET) 5-325 MG tablet Take 1 tablet by mouth daily as needed for severe pain.  04/09/16  Yes  [provider]  spironolactone (ALDACTONE) 25 MG tablet Take 1 tablet (25 mg total) by mouth daily. 07/15/16 07/10/17 Yes Sw[place]il[place], Peter M, MD  traMADol (ULTRAM) 50 MG tablet Take 0.5 tablets (25 mg total) by mouth every 6 (six) hours as needed. 06/26/16  Yes Gherghe, Daylene Katayama, MD  Wheat Dextrin (BENEFIBER) POWD Take 2 scoop by mouth daily as needed (for constipation).    Yes [provider]  XIIDRA 5 % SOLN Place 1 drop into both eyes daily as needed (dry eyes).  10/16/15  Yes [provider]    Family History Family History  Problem Relation Age of Onset  . Pancreatic cancer Mother   . Diabetes Father   . Heart disease Father   . Glaucoma Father   . Kidney failure Brother     Social History Social History  Substance Use Topics  . Smoking status: Former Smoker  Packs/day: 2.00    Years: 12.00    Types: Cigarettes    Quit date: 05/06/1989  . Smokeless tobacco: Never Used     Comment: quit smoking 1990  . Alcohol use No     Allergies   Adhesive [tape]; Gabapentin (once-daily); Sulfa antibiotics; and Codeine   Review of Systems Review of Systems  Constitutional: Positive for unexpected weight change.       Gain 3 pounds 2 days ago  HENT: Negative.   Respiratory: Negative.   Cardiovascular: Positive for chest pain.  Gastrointestinal: Negative.   Musculoskeletal: Positive for myalgias.       Chronic nerve pain and pain in all 4 extremity  Skin: Negative.   Neurological: Negative.   Psychiatric/Behavioral: Negative.   All other systems reviewed and are negative.    Physical Exam Updated Vital Signs BP 125/89 (BP Location: Right Arm)   Pulse 90   Temp 98.1 F (36.7 C) (Oral)   Resp 16   SpO2 100%   Physical Exam  Constitutional: She appears well-developed and well-nourished.  HENT:  Head: Normocephalic and atraumatic.  Eyes: Pupils are equal, round, and reactive to light. Conjunctivae are normal.  Neck: Neck supple. No tracheal  deviation present. No thyromegaly present.  Cardiovascular: Normal rate and regular rhythm.   No murmur heard. Pulmonary/Chest: Effort normal and breath sounds normal.  Abdominal: Soft. Bowel sounds are normal. She exhibits no distension. There is no tenderness.  Musculoskeletal: Normal range of motion. She exhibits no edema or tenderness.  Neurological: She is alert. Coordination normal.  Skin: Skin is warm and dry. No rash noted.  Psychiatric: She has a normal mood and affect.  Nursing note and vitals reviewed.    ED Treatments / Results  Labs (all labs ordered are listed, but only abnormal results are displayed) Labs Reviewed  BASIC METABOLIC PANEL - Abnormal; Notable for the following:       Result Value   Glucose, Bld 104 (*)    All other components within normal limits  CBC  I-STAT TROPONIN, ED    EKG  EKG Interpretation  Date/Time:  Tuesday January 14 2017 09:57:45 EDT Ventricular Rate:  91 PR Interval:  160 QRS Duration: 86 QT Interval:  346 QTC Calculation: 425 R Axis:   33 Text Interpretation:  Normal sinus rhythm Low voltage QRS Cannot rule out Anterior infarct , age undetermined T wave abnormality, consider inferior ischemia Abnormal ECG No significant change since last tracing Confirmed by Doug Sou [phone]) on 01/14/2017 10:50:55 AM       Radiology Dg Chest 2 View  Result Date: 01/14/2017 CLINICAL DATA:  Dizziness, lightheadedness, chest pain EXAM: CHEST  2 VIEW COMPARISON:  06/25/2016 FINDINGS: Heart and mediastinal contours are within normal limits. No focal opacities or effusions. No acute bony abnormality. IMPRESSION: No active cardiopulmonary disease. Electronically Signed   By: Charlett Nose M.D.   On: 01/14/2017 10:23   Procedures Procedures (including critical care time)  Medications Ordered in ED Medications - No data to display Chest x-ray viewed by me Results for orders placed or performed during the hospital encounter of 01/14/17    Basic metabolic panel  Result Value Ref Range   Sodium 139 135 - 145 mmol/L   Potassium 3.9 3.5 - 5.1 mmol/L   Chloride 105 101 - 111 mmol/L   CO2 25 22 - 32 mmol/L   Glucose, Bld 104 (H) 65 - 99 mg/dL   BUN 13 6 - 20 mg/dL   Creatinine, Ser  0.91 0.44 - 1.00 mg/dL   Calcium 9.4 8.9 - 78.2 mg/dL   GFR calc non Af Amer >60 >60 mL/min   GFR calc Af Amer >60 >60 mL/min   Anion gap 9 5 - 15  CBC  Result Value Ref Range   WBC 5.4 4.0 - 10.5 K/uL   RBC 4.16 3.87 - 5.11 MIL/uL   Hemoglobin 12.7 12.0 - 15.0 g/dL   HCT 95.6 21.3 - 08.6 %   MCV 92.3 78.0 - 100.0 fL   MCH 30.5 26.0 - 34.0 pg   MCHC 33.1 30.0 - 36.0 g/dL   RDW 57.8 46.9 - 62.9 %   Platelets 239 150 - 400 K/uL  I-stat troponin, ED  Result Value Ref Range   Troponin i, poc 0.00 0.00 - 0.08 ng/mL   Comment 3           Dg Chest 2 View  Result Date: 01/14/2017 CLINICAL DATA:  Dizziness, lightheadedness, chest pain EXAM: CHEST  2 VIEW COMPARISON:  06/25/2016 FINDINGS: Heart and mediastinal contours are within normal limits. No focal opacities or effusions. No acute bony abnormality. IMPRESSION: No active cardiopulmonary disease. Electronically Signed   By: Charlett Nose M.D.   On: 01/14/2017 10:23   Results for orders placed or performed during the hospital encounter of 01/14/17  Basic metabolic panel  Result Value Ref Range   Sodium 139 135 - 145 mmol/L   Potassium 3.9 3.5 - 5.1 mmol/L   Chloride 105 101 - 111 mmol/L   CO2 25 22 - 32 mmol/L   Glucose, Bld 104 (H) 65 - 99 mg/dL   BUN 13 6 - 20 mg/dL   Creatinine, Ser 5.28 0.44 - 1.00 mg/dL   Calcium 9.4 8.9 - 41.3 mg/dL   GFR calc non Af Amer >60 >60 mL/min   GFR calc Af Amer >60 >60 mL/min   Anion gap 9 5 - 15  CBC  Result Value Ref Range   WBC 5.4 4.0 - 10.5 K/uL   RBC 4.16 3.87 - 5.11 MIL/uL   Hemoglobin 12.7 12.0 - 15.0 g/dL   HCT 24.4 01.0 - 27.2 %   MCV 92.3 78.0 - 100.0 fL   MCH 30.5 26.0 - 34.0 pg   MCHC 33.1 30.0 - 36.0 g/dL   RDW 53.6 64.4 - 03.4 %    Platelets 239 150 - 400 K/uL  I-stat troponin, ED  Result Value Ref Range   Troponin i, poc 0.00 0.00 - 0.08 ng/mL   Comment 3           Dg Chest 2 View  Result Date: 01/14/2017 CLINICAL DATA:  Dizziness, lightheadedness, chest pain EXAM: CHEST  2 VIEW COMPARISON:  06/25/2016 FINDINGS: Heart and mediastinal contours are within normal limits. No focal opacities or effusions. No acute bony abnormality. IMPRESSION: No active cardiopulmonary disease. Electronically Signed   By: Charlett Nose M.D.   On: 01/14/2017 10:23   Initial Impression / Assessment and Plan / ED Course  I have reviewed the triage vital signs and the nursing notes.  Pertinent labs & imaging results that were available during my care of the patient were reviewed by me and considered in my medical decision making (see chart for details).     I consulted cardiology in the ED who will come to the emergency department to evaluate patient. Patient is signed out to Dr. Rubin Payor pending evaluation from cardiology  Final Clinical Impressions(s) / ED Diagnoses  Diagnosis syncope Final diagnoses:  None  New Prescriptions New Prescriptions   No medications on file     Doug Sou, MD 01/14/17 1539

## 2017-01-14 NOTE — ED Provider Notes (Signed)
  Physical Exam  BP (!) 117/98   Pulse 92   Temp 98.1 F (36.7 C) (Oral)   Resp 14   SpO2 100%   Physical Exam  ED Course  Procedures  MDM Patient has now been seen by cardiology. They recommend medicine admission. States monitoring overnight and likely Holter arranged after that.       Benjiman Core, MD 01/14/17 1715

## 2017-01-14 NOTE — ED Notes (Signed)
Pt denies chest pain at this time, c/o nerve pain in right leg.

## 2017-01-14 NOTE — Telephone Encounter (Signed)
Pt states she went to ED as advised yesterday, but left before being evaluated d/t reported wait time (states they offered to do initial bloodwork, EKG, triage based on urgency, but that they told her it could be several hours for full evaluation. Pt informed me she did not wish to wait).  Today c/o continued weakness, pain, heavy arms and legs, nausea, "heavy chested". She reports being out of breath w minimal exertion  Reports between saturday and sunday she gained 3 lbs, gathering fluid in the bottom of her legs, sees indention in her legs if she leans her legs against something.  Reports black out episode on sunday along with sensation of "poking needles into her eyeballs". Noted migraine-type symptoms. Migraine symptoms this AM.  Reviewed notes from Dr. Jens Som as well as on-call physician. Advised patient that if she is still having symptoms I would not disagree w advice given by physicians and would strongly encourage ED evaluation.  Pt verbalized understanding and will go to [facility] for assessment. I have notified Trish, cardiology cardmaster.

## 2017-01-14 NOTE — ED Notes (Signed)
Spoke with Diplomatic Services operational officer, pt is wondering about when cardiology will be in. They will page to get an update, and let RN or patient know.

## 2017-01-14 NOTE — Telephone Encounter (Signed)
New message     Pt is following up, regarding what happened yesterday please call asap

## 2017-01-14 NOTE — H&P (Signed)
History and Physical    Kristine Curtis NWG:[id number] DOB: 1961-02-07 DOA: 01/14/2017  PCP: [name], MD  Patient coming from: Home  I have personally briefly reviewed patient's old medical records in [facility]  Chief Complaint: Syncope  HPI: Kristine Curtis is a 56 y.o. female with medical history significant of complex cardiac history of nonischemic cardiomyopathy, systolic and diastolic heart failure, history of PACs and PVCs by Holter monitor, fibromyalgia, restless leg syndrome, hypertension, depression who presents to the ED with syncopal episodes. Patient does state has had a 3 pound weight gain over the past 3 days prior to admission. Patient states 2 days prior to admission while standing up to put her shirt on she blacked out for a few seconds. Patient states episode was preceded by dizziness, lightheadedness. Patient also endorses a feeling of chest squeezing and fatigue. Patient denies any seizure-like activity. Patient stated that she did not fall nor hurt herself. Patient states one day prior to admission had a near syncopal episode with associated lightheadedness and dizziness, some shortness of breath, fatigue, a sensation of chest squeezing and overall feeling of heaviness and tiredness. Patient states episode was similar to that that occurred 2 days prior to admission except she did not black out totally. Patient endorses some nausea, dizziness, shortness of breath, bloating, chest pain with a sensation of squeezing, alternating diarrhea and constipation, streaks of blood noted on polyp April. Patient denies any fever, no chills, no emesis, no melena, no hematemesis, no productive cough.   ED Course: Patient seen in the ED chest x-ray done was unremarkable. Basic metabolic profile done with a glucose of 104 otherwise was within normal limits. Point-of-care troponin was negative. CBC was unremarkable. Urinalysis not done. EKG with normal sinus rhythm with some  T-wave inversions in leads 2,3 and aVF which is very minimal. Patient seen in consultation by cardiology who recommended patient be admitted to be monitored on telemetry, and if no arrhythmias noted overnight patient may be set up for a Holter monitor in the outpatient setting.  Review of Systems: As per HPI otherwise 10 point review of systems negative.   Past Medical History:  Diagnosis Date  . Allergic rhinitis due to pollen   . Asthma   . Chronic combined systolic and diastolic CHF (congestive heart failure) (HCC)    a. 06/2016 Echo: EF 40-45%, Gr1 DD.  Marland Kitchen Depression   . Family history of polycystic kidney 11/16/2012   neg CT  . Fibromyalgia   . GERD (gastroesophageal reflux disease)   . Hypertension   . Hypothyroidism   . IBS (irritable bowel syndrome)   . Internal hemorrhoids   . Morbid obesity (HCC)   . NICM (nonischemic cardiomyopathy) (HCC)    a. 1999 Cath: nl cors;  b. EF prev as low as 35%;  c. 11/2015 Echo: Ef 40-45%;  d. 06/2016 Echo: EF 40-45%;  e. Lexiscan MV: fixed anterosepta/inferseptal defect w/o ischemia, EF 35%.  . Osteoarthritis   . PVC (premature ventricular contraction)   . Restless leg syndrome   . Sicca (HCC) 11/22/2014  . Vertigo     Past Surgical History:  Procedure Laterality Date  . ABDOMINAL HYSTERECTOMY    . CESAREAN SECTION    . CHOLECYSTECTOMY  10-2008  . COLONOSCOPY    . ESOPHAGOGASTRODUODENOSCOPY    . EYE SURGERY     2 2013 and 1981/DCR of right eye 05/2014  . LUMBAR DISC SURGERY  2016   L3-4  . RIGHT/LEFT HEART CATH  AND CORONARY ANGIOGRAPHY N/A 07/11/2016   Procedure: Right/Left Heart Cath and Coronary Angiography;  Surgeon: Peter M Swaziland, MD;  Location: [facility] INVASIVE CV LAB;  Service: Cardiovascular;  Laterality: N/A;  . SHOULDER ARTHROSCOPY W/ SUBACROMIAL DECOMPRESSION AND DISTAL CLAVICLE EXCISION Right    rotator cuff repair  . TUBAL LIGATION  1992     reports that she quit smoking about 27 years ago. Her smoking use included Cigarettes. She  has a 24.00 pack-year smoking history. She has never used smokeless tobacco. She reports that she does not drink alcohol or use drugs.  Allergies  Allergen Reactions  . Adhesive [Tape]     Redness   . Gabapentin (Once-Daily)     Depression   . Sulfa Antibiotics     itching  . Codeine Itching    Family History  Problem Relation Age of Onset  . Pancreatic cancer Mother   . Diabetes Father   . Heart disease Father   . Glaucoma Father   . Kidney failure Brother     Family history reviewed and not pertinent   Prior to Admission medications   Medication Sig Start Date End Date Taking? Authorizing Provider  acetaminophen (TYLENOL) 500 MG tablet Take 1,000 mg by mouth every 4 (four) hours as needed for moderate pain or headache.   Yes [provider]  Aspirin-Acetaminophen-Caffeine (EXCEDRIN EXTRA STRENGTH PO) Take 2 tablets by mouth 2 (two) times daily as needed (headaches).   Yes [provider]  buPROPion (WELLBUTRIN XL) 300 MG 24 hr tablet Take 1 tablet (300 mg total) by mouth daily. 04/25/16  Yes Copland, Karleen Hampshire, MD  carvedilol (COREG) 25 MG tablet Take 1.5 tablets (37.5 mg total) by mouth 2 (two) times daily with a meal. Patient taking differently: Take 25 mg by mouth 2 (two) times daily with a meal.  07/17/16  Yes Swaziland, Peter M, MD  cevimeline [facility]) 30 MG capsule Take 30 mg by mouth daily.    Yes [provider]  Cholecalciferol (VITAMIN D-3) 5000 UNITS TABS Take 5,000 Units by mouth daily.    Yes [provider]  clotrimazole-betamethasone (LOTRISONE) lotion Apply topically 2 (two) times daily. 12/17/16  Yes Bedsole, Amy E, MD  cyclobenzaprine (FLEXERIL) 5 MG tablet Take 5 mg by mouth 2 (two) times daily.  08/09/15  Yes [provider]  diazepam (VALIUM) 5 MG tablet TAKE ONE (1) TABLET BY MOUTH EVERY SIX HOURS AS NEEDED 07/13/16  Yes Copland, Karleen Hampshire, MD  Docusate Sodium (COLACE PO) Take 1 tablet by mouth as needed (CONSTIPATION).     Yes [provider]  furosemide (LASIX) 20 MG tablet TAKE 1 TABLET BY MOUTH DAILY 11/18/16  Yes Robbie Lis M, PA-C  HYDROcodone-acetaminophen (NORCO/VICODIN) 5-325 MG tablet Take 1 tablet by mouth 3 (three) times daily as needed for moderate pain. 12/30/16  Yes Copland, Karleen Hampshire, MD  hydroxychloroquine (PLAQUENIL) 200 MG tablet Take 400 mg by mouth every morning.  06/07/15  Yes [provider]  levothyroxine (SYNTHROID, LEVOTHROID) 88 MCG tablet TAKE 1 TABLET BY MOUTH DAILY 12/16/16  Yes Copland, Karleen Hampshire, MD  losartan (COZAAR) 100 MG tablet TAKE 1 TABLET BY MOUTH DAILY 12/16/16  Yes Sharol Harness, Brittainy M, PA-C  meloxicam (MOBIC) 7.5 MG tablet TAKE 1 TABLET BY MOUTH DAILY 08/12/16  Yes Copland, Karleen Hampshire, MD  omeprazole (PRILOSEC) 40 MG capsule TAKE 1 CAPSULE BY MOUTH BEFORE BREAKFAST 12/20/16  Yes Iva Boop, MD  ONE TOUCH ULTRA TEST test strip USE AS DIRECTED TO CHECK BLOOD SUGAR ONCE  A DAY 07/12/16  Yes Copland, Karleen Hampshire, MD  OVER THE COUNTER MEDICATION Take 5 capsules by mouth daily. [employer] Optimals Vitamin & Minerals    Yes [provider]  OVER THE COUNTER MEDICATION Take 1 scoop by mouth daily. Ariix Magnecal D    Yes [provider]  oxyCODONE-acetaminophen (PERCOCET/ROXICET) 5-325 MG tablet Take 1 tablet by mouth daily as needed for severe pain.  04/09/16  Yes [provider]  spironolactone (ALDACTONE) 25 MG tablet Take 1 tablet (25 mg total) by mouth daily. 07/15/16 07/10/17 Yes Swaziland, Peter M, MD  traMADol (ULTRAM) 50 MG tablet Take 0.5 tablets (25 mg total) by mouth every 6 (six) hours as needed. 06/26/16  Yes Gherghe, Daylene Katayama, MD  Wheat Dextrin (BENEFIBER) POWD Take 2 scoop by mouth daily as needed (for constipation).    Yes [provider]  XIIDRA 5 % SOLN Place 1 drop into both eyes daily as needed (dry eyes).  10/16/15  Yes [provider]    Physical Exam: Vitals:   01/14/17 1403 01/14/17 1430 01/14/17 1530 01/14/17 1700    BP: 125/84 (!) 116/58 (!) 117/98 (!) 139/92  Pulse: 90 87 92 93  Resp: 18 (!) Temp:      TempSrc:      SpO2: 100% 98% 100% 100%    Constitutional: NAD, calm, comfortable Vitals:   01/14/17 1403 01/14/17 1430 01/14/17 1530 01/14/17 1700  BP: 125/84 (!) 116/58 (!) 117/98 (!) 139/92  Pulse: 90 87 92 93  Resp: 18 (!) Temp:      TempSrc:      SpO2: 100% 98% 100% 100%   Eyes: PERRLA, EOMI, lids and conjunctivae normal ENMT: Mucous membranes are moist. Posterior pharynx clear of any exudate or lesions.Normal dentition.  Neck: normal, supple, no masses, no thyromegaly Respiratory: clear to auscultation bilaterally, no wheezing, no crackles. Normal respiratory effort. No accessory muscle use.  Cardiovascular: Regular rate and rhythm, no murmurs / rubs / gallops. No extremity edema. 2+ pedal pulses. No carotid bruits.  Abdomen: no tenderness, no masses palpated. No hepatosplenomegaly. Bowel sounds positive.  Musculoskeletal: no clubbing / cyanosis. No joint deformity upper and lower extremities. Good ROM, no contractures. Normal muscle tone.  Skin: no rashes, lesions, ulcers. No induration Neurologic: CN 2-12 grossly intact. Sensation intact, DTR normal. Strength 5/5 in BUE, 4/5 LLE strength, 2-3/5.  Psychiatric: Normal judgment and insight. Alert and oriented x 3. Normal mood.  Labs on Admission: I have personally reviewed following labs and imaging studies  CBC:  Recent Labs Lab 01/14/17 0959  WBC 5.4  HGB 12.7  HCT 38.4  MCV 92.3  PLT 239   Basic Metabolic Panel:  Recent Labs Lab 01/14/17 0959  NA 139  K 3.9  CL 105  CO2 25  GLUCOSE 104*  BUN 13  CREATININE 0.91  CALCIUM 9.4   GFR: CrCl cannot be calculated (Unknown ideal weight.). Liver Function Tests: No results for input(s): AST, ALT, ALKPHOS, BILITOT, PROT, ALBUMIN in the last 168 hours. No results for input(s): LIPASE, AMYLASE in the last 168 hours. No results for input(s): AMMONIA in  the last 168 hours. Coagulation Profile: No results for input(s): INR, PROTIME in the last 168 hours. Cardiac Enzymes: No results for input(s): CKTOTAL, CKMB, CKMBINDEX, TROPONINI in the last 168 hours. BNP (last 3 results) No results for input(s): PROBNP in the last 8760 hours. HbA1C: No results for input(s): HGBA1C in the last 72 hours. CBG: No  results for input(s): GLUCAP in the last 168 hours. Lipid Profile: No results for input(s): CHOL, HDL, LDLCALC, TRIG, CHOLHDL, LDLDIRECT in the last 72 hours. Thyroid Function Tests: No results for input(s): TSH, T4TOTAL, FREET4, T3FREE, THYROIDAB in the last 72 hours. Anemia Panel: No results for input(s): VITAMINB12, FOLATE, FERRITIN, TIBC, IRON, RETICCTPCT in the last 72 hours. Urine analysis:    Component Value Date/Time   COLORURINE YELLOW 10/05/2008 0754   APPEARANCEUR CLOUDY (A) 10/05/2008 0754   LABSPEC 1.020 10/05/2008 0754   PHURINE 6.0 10/05/2008 0754   GLUCOSEU NEGATIVE 10/05/2008 0754   HGBUR SMALL (A) 10/05/2008 0754   BILIRUBINUR negative 05/09/2016 0948   KETONESUR 15 (A) 10/05/2008 0754   PROTEINUR +- 05/09/2016 0948   PROTEINUR NEGATIVE 10/05/2008 0754   UROBILINOGEN negative 05/09/2016 0948   UROBILINOGEN 0.2 10/05/2008 0754   NITRITE negative 05/09/2016 0948   NITRITE NEGATIVE 10/05/2008 0754   LEUKOCYTESUR Negative 05/09/2016 0948    Radiological Exams on Admission: Dg Chest 2 View  Result Date: 01/14/2017 CLINICAL DATA:  Dizziness, lightheadedness, chest pain EXAM: CHEST  2 VIEW COMPARISON:  06/25/2016 FINDINGS: Heart and mediastinal contours are within normal limits. No focal opacities or effusions. No acute bony abnormality. IMPRESSION: No active cardiopulmonary disease. Electronically Signed   By: Charlett Nose M.D.   On: 01/14/2017 10:23    EKG: Independently reviewed. Normal sinus rhythm. Decreased voltage. T-wave inversions in leads 23 aVF.  Assessment/Plan Principal Problem:   Syncope Active  Problems:   Hypothyroidism   Major depression in complete remission (HCC)   Essential hypertension   GERD   Chronic systolic congestive heart failure (HCC)   #1 syncope Patient with extensive complex cardiac history presented with a syncopal episode. Concern for cardiogenic etiology. Patient with no focal neurological symptoms. Place in observation to monitor for any arrhythmias. Check a TSH, d-dimer, cardiac enzymes every 6 hours 3, UA with cultures and sensitivities, CT head. Patient be monitored on telemetry. CT head is negative and no etiology noted may consider MRI of the head. Cardiology following and appreciate input and recommendations.  #2 hypothyroidism Check a TSH. Continue home dose Synthroid.  #3 major depression in complete remission Continue Wellbutrin. Follow.  #4 gastroesophageal reflux disease PPI.  #5 chronic systolic heart failure/nonischemic cardiomyopathy Currently stable. 2-D echo from 06/28/2016 with a EF of 40-45%, diffuse hypokinesis, grade 1 diastolic dysfunction. Patient euvolemic on examination. Continue home regimen of Coreg, Lasix, spironolactone, Cozaar. Cardiology following.  #6 hypertension Stable. Continue Coreg, Lasix.  #7 chronic pain/Fibromyalgia Continue home regimen of pain medications, continue Wellbutrin..  #8 Sjogrens syndrome Continue home regimen of medications. Outpatient follow-up with rheumatologist.     DVT prophylaxis: Lovenox Code Status: Full Family Communication: Updated patient. No family at bedside. Disposition Plan: Like home when workup completed. Consults called: Cardiology Admission status: Place in observation   [facility] MD [facility] Pager [phone]  If 7PM-7AM, please contact night-coverage [email or web address] Password [facility]  01/14/2017, 6:34 PM

## 2017-01-14 NOTE — ED Notes (Signed)
Pt in xray, they will bring her to A14.

## 2017-01-14 NOTE — Consult Note (Signed)
Cardiology Consultation:   Patient ID: Kristine Curtis; [id number]; 07-25-1960   Admit date: 01/14/2017 Date of Consult: 01/14/2017  Primary Care Provider: [name], MD Primary Cardiologist: Dr. Swaziland Primary Electrophysiologist:  Dr. Ladona Ridgel   Patient Profile:   Kristine Curtis is a 56 y.o. female with a hx of nonischemic cardiomyopathy, systolic and diastolic heart failure, hx of PACs and PVCs by holter monitor, fibromyalgia, restless leg syndrome, HTN, and depression who is being seen today for the evaluation of syncope at the request of Dr. Rubin Payor.  History of Present Illness:   Ms. Kristine Curtis was admitted to [facility] 06/2016 with chest pain and extremity weakness. Troponins were negative and she underwent an outpatient myoview stress test which showed no reversible ischemia. Echo at that time with EF of 40-45%. She continued to complain of DOE and reduced exercise tolerance. She had a repeat R/L heart cath on 07/11/16 with normal coronaries and no pulmonary hypertension. LVEF was estimated at 35-40% at that time. Medications were adjusted to include coreg, losartan, and aldactone.   She last saw Dr. Swaziland in the clinic on 10/17/16. At that time, she reported doing "OK" with some residual SOB and lightheadedness. She was working on weight loss.   She called [facility] yesterday with c/o swelling in her ankles and a "black out" episode. She was instructed to go to the ED, which she did but did not stay to be evaluated. He re-presents today 01/14/17.    She states that she gained three lbs in one day and then passed out. She reports dizziness and lightheadedness since Feb. She suffered a sycnopal episode on 01/12/17 and a near syncopal episode on 01/13/17. She felt lightheaded before both episodes and has felt a squeezing feeling in her chest that has been constant since this morning.   On my interview, the patient states the following complaints: palpitations, heavy chest, malaise, weakness,  fatigue, difficulty breathing, dizziness, faintness, confusion, memory loss, lack of concentration, depression, nerve pain, joint pain, weight gain of 3 lbs in one day, syncope on 01/12/17 without injury and without head injury, vertigo since Feb 2018, nausea, restless legs, sensation of needle sticking in her pupil in her eyes. She states that she does not feel good overall.  She reports dizziness since February. The syncopal episodes were witnessed by her husband.   Past Medical History:  Diagnosis Date  . Allergic rhinitis due to pollen   . Asthma   . Chronic combined systolic and diastolic CHF (congestive heart failure) (HCC)    a. 06/2016 Echo: EF 40-45%, Gr1 DD.  Marland Kitchen Depression   . Family history of polycystic kidney 11/16/2012   neg CT  . Fibromyalgia   . GERD (gastroesophageal reflux disease)   . Hypertension   . Hypothyroidism   . IBS (irritable bowel syndrome)   . Internal hemorrhoids   . Morbid obesity (HCC)   . NICM (nonischemic cardiomyopathy) (HCC)    a. 1999 Cath: nl cors;  b. EF prev as low as 35%;  c. 11/2015 Echo: Ef 40-45%;  d. 06/2016 Echo: EF 40-45%;  e. Lexiscan MV: fixed anterosepta/inferseptal defect w/o ischemia, EF 35%.  . Osteoarthritis   . PVC (premature ventricular contraction)   . Restless leg syndrome   . Sicca (HCC) 11/22/2014  . Vertigo     Past Surgical History:  Procedure Laterality Date  . ABDOMINAL HYSTERECTOMY    . CESAREAN SECTION    . CHOLECYSTECTOMY  10-2008  . COLONOSCOPY    .  ESOPHAGOGASTRODUODENOSCOPY    . EYE SURGERY     2 2013 and 1981/DCR of right eye 05/2014  . LUMBAR DISC SURGERY  2016   L3-4  . RIGHT/LEFT HEART CATH AND CORONARY ANGIOGRAPHY N/A 07/11/2016   Procedure: Right/Left Heart Cath and Coronary Angiography;  Surgeon: Peter M Swaziland, MD;  Location: [facility] INVASIVE CV LAB;  Service: Cardiovascular;  Laterality: N/A;  . SHOULDER ARTHROSCOPY W/ SUBACROMIAL DECOMPRESSION AND DISTAL CLAVICLE EXCISION Right    rotator cuff repair  . TUBAL  LIGATION  1992     Home Medications:  Prior to Admission medications   Medication Sig Start Date End Date Taking? Authorizing Provider  acetaminophen (TYLENOL) 500 MG tablet Take 1,000 mg by mouth every 4 (four) hours as needed for moderate pain or headache.   Yes [provider]  Aspirin-Acetaminophen-Caffeine (EXCEDRIN EXTRA STRENGTH PO) Take 2 tablets by mouth 2 (two) times daily as needed (headaches).   Yes [provider]  buPROPion (WELLBUTRIN XL) 300 MG 24 hr tablet Take 1 tablet (300 mg total) by mouth daily. 04/25/16  Yes Copland, Karleen Hampshire, MD  carvedilol (COREG) 25 MG tablet Take 1.5 tablets (37.5 mg total) by mouth 2 (two) times daily with a meal. Patient taking differently: Take 25 mg by mouth 2 (two) times daily with a meal.  07/17/16  Yes Swaziland, Peter M, MD  cevimeline [facility]) 30 MG capsule Take 30 mg by mouth daily.    Yes [provider]  Cholecalciferol (VITAMIN D-3) 5000 UNITS TABS Take 5,000 Units by mouth daily.    Yes [provider]  clotrimazole-betamethasone (LOTRISONE) lotion Apply topically 2 (two) times daily. 12/17/16  Yes Bedsole, Amy E, MD  cyclobenzaprine (FLEXERIL) 5 MG tablet Take 5 mg by mouth 2 (two) times daily.  08/09/15  Yes [provider]  diazepam (VALIUM) 5 MG tablet TAKE ONE (1) TABLET BY MOUTH EVERY SIX HOURS AS NEEDED 07/13/16  Yes Copland, Karleen Hampshire, MD  Docusate Sodium (COLACE PO) Take 1 tablet by mouth as needed (CONSTIPATION).    Yes [provider]  furosemide (LASIX) 20 MG tablet TAKE 1 TABLET BY MOUTH DAILY 11/18/16  Yes Robbie Lis M, PA-C  HYDROcodone-acetaminophen (NORCO/VICODIN) 5-325 MG tablet Take 1 tablet by mouth 3 (three) times daily as needed for moderate pain. 12/30/16  Yes Copland, Karleen Hampshire, MD  hydroxychloroquine (PLAQUENIL) 200 MG tablet Take 400 mg by mouth every morning.  06/07/15  Yes [provider]  levothyroxine (SYNTHROID, LEVOTHROID) 88 MCG tablet TAKE 1 TABLET BY  MOUTH DAILY 12/16/16  Yes Copland, Karleen Hampshire, MD  losartan (COZAAR) 100 MG tablet TAKE 1 TABLET BY MOUTH DAILY 12/16/16  Yes Sharol Harness, Brittainy M, PA-C  meloxicam (MOBIC) 7.5 MG tablet TAKE 1 TABLET BY MOUTH DAILY 08/12/16  Yes Copland, Karleen Hampshire, MD  omeprazole (PRILOSEC) 40 MG capsule TAKE 1 CAPSULE BY MOUTH BEFORE BREAKFAST 12/20/16  Yes Iva Boop, MD  ONE TOUCH ULTRA TEST test strip USE AS DIRECTED TO CHECK BLOOD SUGAR ONCE A DAY 07/12/16  Yes Copland, Karleen Hampshire, MD  OVER THE COUNTER MEDICATION Take 5 capsules by mouth daily. Ariix Optimals Vitamin & Minerals    Yes [provider]  OVER THE COUNTER MEDICATION Take 1 scoop by mouth daily. Ariix Magnecal D    Yes [provider]  oxyCODONE-acetaminophen (PERCOCET/ROXICET) 5-325 MG tablet Take 1 tablet by mouth daily as needed for severe pain.  04/09/16  Yes [provider]  spironolactone (ALDACTONE) 25 MG tablet Take 1 tablet (25 mg total) by  mouth daily. 07/15/16 07/10/17 Yes Swaziland, Peter M, MD  traMADol (ULTRAM) 50 MG tablet Take 0.5 tablets (25 mg total) by mouth every 6 (six) hours as needed. 06/26/16  Yes Gherghe, Daylene Katayama, MD  Wheat Dextrin (BENEFIBER) POWD Take 2 scoop by mouth daily as needed (for constipation).    Yes [provider]  XIIDRA 5 % SOLN Place 1 drop into both eyes daily as needed (dry eyes).  10/16/15  Yes [provider]    Inpatient Medications: Scheduled Meds:  Continuous Infusions:  PRN Meds:   Allergies:    Allergies  Allergen Reactions  . Adhesive [Tape]     Redness   . Gabapentin (Once-Daily)     Depression   . Sulfa Antibiotics     itching  . Codeine Itching    Social History:   Social History   Social History  . Marital status: Married    Spouse name: N/A  . Number of children: 2  . Years of education: N/A   Occupational History  . quality assurance tech    Social History Main Topics  . Smoking status: Former Smoker    Packs/day: 2.00    Years: 12.00     Types: Cigarettes    Quit date: 05/06/1989  . Smokeless tobacco: Never Used     Comment: quit smoking 1990  . Alcohol use No  . Drug use: No  . Sexual activity: Not on file   Other Topics Concern  . Not on file   Social History Narrative   Former Dr. Andrey Campanile then Dr. 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 Pancreatic cancer Mother   . Diabetes Father   . Heart disease Father   . Glaucoma Father   . Kidney failure Brother      ROS:  Please see the history of present illness.  ROS  All other ROS reviewed and negative.     Physical Exam/Data:   Vitals:   01/14/17 1035 01/14/17 1403 01/14/17 1430 01/14/17 1530  BP: 125/89 125/84 (!) 116/58 (!) 117/98  Pulse: 90 90 87 92  Resp: 16 18 (!) 25 14  Temp:      TempSrc:      SpO2: 100% 100% 98% 100%   No intake or output data in the 24 hours ending 01/14/17 1628 There were no vitals filed for this visit. There is no height or weight on file to calculate BMI.  General:  Well nourished, well developed, in no acute distress HEENT: normal Lymph: no adenopathy Neck: no JVD Vascular: No carotid bruits; FA pulses 2+ bilaterally without bruits  Cardiac:  normal S1, S2; RRR; no murmur  Lungs:  clear to auscultation bilaterally, no wheezing, rhonchi or rales  Abd: soft, nontender, no hepatomegaly  Ext: trace edema Musculoskeletal:  No deformities, BUE and BLE strength normal and equal Skin: warm and dry  Psych:  Normal affect  EKG:  The EKG was personally reviewed and demonstrates:  NSR Telemetry:  Telemetry was personally reviewed and demonstrates:  NSR   Relevant CV Studies:  Right and left heart cath 07/11/16:  There is moderate left ventricular systolic dysfunction.  The left ventricular ejection fraction is 35-45% by visual estimate.  LV end diastolic pressure is mildly elevated.   1. Normal coronary anatomy 2. Moderate LV  dysfunction- global. EF 35-40%.  3. Mildly elevated LVEDP 4.  Normal pulmonary pressures. RA pressure is elevated. 5. Normal cardiac output.  Plan: continue medical therapy. Consider addition of aldactone to current therapy   NM stress test 07/03/16:  The left ventricular ejection fraction is moderately decreased (30-44%).  Nuclear stress EF: 35%.  No T wave inversion was noted during stress.  There was no ST segment deviation noted during stress.  Defect 1: There is a medium defect of moderate severity.  Findings consistent with prior myocardial infarction.   Medium size, moderate intensity fixed anteroseptal and inferoseptal perfusion defect suggestive of scar or possibly artifact. LVEF 35% with anteroseptal and septal akinesis and mild LV dilitation. This is an intermediate risk study.   Echocardiogram 06/28/16: Study Conclusions - Left ventricle: The cavity size was mildly dilated. Wall   thickness was increased in a pattern of mild LVH. Systolic   function was mildly to moderately reduced. The estimated ejection   fraction was in the range of 40% to 45%. Diffuse hypokinesis.   Doppler parameters are consistent with abnormal left ventricular   relaxation (grade 1 diastolic dysfunction). - Left atrium: The atrium was mildly dilated.  Impressions: - Limited study to assess LV function; full doppler study not   performed; mild to moderate global reduction in LV systolic   function; grade 1 diastolic dysfunction; mild LVH; mild LVE; mild   LAE.   Echocardiogram 11/28/15: Study Conclusions - Left ventricle: The cavity size was normal. Wall thickness was   normal. Systolic function was mildly to moderately reduced. The   estimated ejection fraction was in the range of 40% to 45%.   Diffuse hypokinesis. Doppler parameters are consistent with   abnormal left ventricular relaxation (grade 1 diastolic   dysfunction).  Impressions: - EF is mildly improved when compared to  prior (30%).   Laboratory Data:  Chemistry Recent Labs Lab 01/14/17 0959  NA 139  K 3.9  CL 105  CO2 25  GLUCOSE 104*  BUN 13  CREATININE 0.91  CALCIUM 9.4  GFRNONAA >60  GFRAA >60  ANIONGAP 9    No results for input(s): PROT, ALBUMIN, AST, ALT, ALKPHOS, BILITOT in the last 168 hours. Hematology Recent Labs Lab 01/14/17 0959  WBC 5.4  RBC 4.16  HGB 12.7  HCT 38.4  MCV 92.3  MCH 30.5  MCHC 33.1  RDW 13.4  PLT 239   Cardiac EnzymesNo results for input(s): TROPONINI in the last 168 hours.  Recent Labs Lab 01/14/17 1019  TROPIPOC 0.00    BNPNo results for input(s): BNP, PROBNP in the last 168 hours.  DDimer No results for input(s): DDIMER in the last 168 hours.  Radiology/Studies:  Dg Chest 2 View  Result Date: 01/14/2017 CLINICAL DATA:  Dizziness, lightheadedness, chest pain EXAM: CHEST  2 VIEW COMPARISON:  06/25/2016 FINDINGS: Heart and mediastinal contours are within normal limits. No focal opacities or effusions. No acute bony abnormality. IMPRESSION: No active cardiopulmonary disease. Electronically Signed   By: Charlett Nose M.D.   On: 01/14/2017 10:23    Assessment and Plan:   1. Syncope - one episode of syncope and one episode of near-syncope, both preceded by feelings of lightheadedness/dizziness - telemetry with NSR - given her recent stress test and heart cath, chest discomfort is likely not anginal or ACS in nature - holter in 2016 with rare PAC and PVC - order orthostatic pressures - electrolyts WNL - will monitor overnight on telemetry - if no arrhythmias, will discharge with another holter monitor   2. Nonischemic cardiomyopathy, chronic  systolic and diastolic heart failure - last echo with LVEF of 40-45% - continue home medications - does not appear volume overloaded on exam     For questions or updates, please contact [facility] Please consult [email or web address] for contact info under Cardiology/STEMI.   Signed, Roe Rutherford  [place], [place]  01/14/2017 4:28 PM

## 2017-01-15 ENCOUNTER — Other Ambulatory Visit: Payer: Self-pay | Admitting: Cardiology

## 2017-01-15 DIAGNOSIS — R55 Syncope and collapse: Secondary | ICD-10-CM | POA: Diagnosis not present

## 2017-01-15 DIAGNOSIS — K219 Gastro-esophageal reflux disease without esophagitis: Secondary | ICD-10-CM | POA: Diagnosis not present

## 2017-01-15 DIAGNOSIS — I5022 Chronic systolic (congestive) heart failure: Secondary | ICD-10-CM | POA: Diagnosis not present

## 2017-01-15 DIAGNOSIS — E038 Other specified hypothyroidism: Secondary | ICD-10-CM | POA: Diagnosis not present

## 2017-01-15 DIAGNOSIS — R079 Chest pain, unspecified: Secondary | ICD-10-CM | POA: Diagnosis not present

## 2017-01-15 DIAGNOSIS — I10 Essential (primary) hypertension: Secondary | ICD-10-CM | POA: Diagnosis not present

## 2017-01-15 LAB — COMPREHENSIVE METABOLIC PANEL
ALT: 28 U/L (ref 14–54)
AST: 27 U/L (ref 15–41)
Albumin: 3.6 g/dL (ref 3.5–5.0)
Alkaline Phosphatase: 64 U/L (ref 38–126)
Anion gap: 6 (ref 5–15)
BUN: 16 mg/dL (ref 6–20)
CO2: 24 mmol/L (ref 22–32)
Calcium: 8.6 mg/dL — ABNORMAL LOW (ref 8.9–10.3)
Chloride: 109 mmol/L (ref 101–111)
Creatinine, Ser: 0.98 mg/dL (ref 0.44–1.00)
GFR calc Af Amer: >60 mL/min (ref >60)
GFR calc non Af Amer: >60 mL/min (ref >60)
Glucose, Bld: 126 mg/dL — ABNORMAL HIGH (ref 65–99)
Potassium: 3.6 mmol/L (ref 3.5–5.1)
Sodium: 139 mmol/L (ref 135–145)
Total Bilirubin: 0.5 mg/dL (ref 0.3–1.2)
Total Protein: 5.8 g/dL — ABNORMAL LOW (ref 6.5–8.1)

## 2017-01-15 LAB — URINALYSIS, ROUTINE W REFLEX MICROSCOPIC
Bilirubin Urine: NEGATIVE
Glucose, UA: NEGATIVE mg/dL
Hgb urine dipstick: NEGATIVE
Ketones, ur: NEGATIVE mg/dL
Leukocytes, UA: NEGATIVE
Nitrite: NEGATIVE
Protein, ur: NEGATIVE mg/dL
Specific Gravity, Urine: 1.018 (ref 1.005–1.030)
pH: 6 (ref 5.0–8.0)

## 2017-01-15 LAB — CBC
HCT: 37 % (ref 36.0–46.0)
Hemoglobin: 12.2 g/dL (ref 12.0–15.0)
MCH: 30.7 pg (ref 26.0–34.0)
MCHC: 33 g/dL (ref 30.0–36.0)
MCV: 93 fL (ref 78.0–100.0)
Platelets: 222 10*3/uL (ref 150–400)
RBC: 3.98 MIL/uL (ref 3.87–5.11)
RDW: 13.7 % (ref 11.5–15.5)
WBC: 5.2 10*3/uL (ref 4.0–10.5)

## 2017-01-15 LAB — GLUCOSE, CAPILLARY: Glucose-Capillary: 89 mg/dL (ref 65–99)

## 2017-01-15 LAB — TROPONIN I
Troponin I: <0.03 ng/mL (ref <0.03)
Troponin I: <0.03 ng/mL (ref <0.03)

## 2017-01-15 LAB — MAGNESIUM: Magnesium: 1.8 mg/dL (ref 1.7–2.4)

## 2017-01-15 LAB — PHOSPHORUS: Phosphorus: 4.4 mg/dL (ref 2.5–4.6)

## 2017-01-15 MED ORDER — CARVEDILOL 25 MG PO TABS: 25.0000 mg | ORAL_TABLET | Freq: Two times a day (BID) | ORAL | Status: DC

## 2017-01-15 NOTE — Progress Notes (Signed)
Discharge instructions (including medications) discussed with and copy provided to patient/caregiver 

## 2017-01-15 NOTE — Progress Notes (Signed)
Patient with no complaints or concerns during 7pm - 7am shift.  Keilynn Marano, RN 

## 2017-01-15 NOTE — Discharge Summary (Signed)
Physician Discharge Summary  Kristine Curtis UVO:[id number] DOB: 04-09-1961 DOA: 01/14/2017  PCP: [name], MD Cardiologist: Dr. [place]il[place] Rheumatologist:   Admit date: 01/14/2017 Discharge date: 01/15/2017  Admitted From: HOME  Disposition: HOME   Recommendations for Outpatient Follow-up:  1. Follow up with PCP in 1 weeks 2. Follow up with cardiologist as scheduled for event monitor placement.   Discharge Condition: STABLE   CODE STATUS: FULL    Brief Hospitalization Summary: Please see all hospital notes, images, labs for full details of the hospitalization.  HPI: KINGSTON GUILES is a 56 y.o. female with medical history significant of complex cardiac history of nonischemic cardiomyopathy, systolic and diastolic heart failure, history of PACs and PVCs by Holter monitor, fibromyalgia, restless leg syndrome, hypertension, depression who presents to the ED with syncopal episodes. Patient does state has had a 3 pound weight gain over the past 3 days prior to admission. Patient states 2 days prior to admission while standing up to put her shirt on she blacked out for a few seconds. Patient states episode was preceded by dizziness, lightheadedness. Patient also endorses a feeling of chest squeezing and fatigue. Patient denies any seizure-like activity. Patient stated that she did not fall nor hurt herself. Patient states one day prior to admission had a near syncopal episode with associated lightheadedness and dizziness, some shortness of breath, fatigue, a sensation of chest squeezing and overall feeling of heaviness and tiredness. Patient states episode was similar to that that occurred 2 days prior to admission except she did not black out totally. Patient endorses some nausea, dizziness, shortness of breath, bloating, chest pain with a sensation of squeezing, alternating diarrhea and constipation, streaks of blood noted on polyp April. Patient denies any fever, no chills, no emesis, no  melena, no hematemesis, no productive cough.   ED Course: Patient seen in the ED chest x-ray done was unremarkable. Basic metabolic profile done with a glucose of 104 otherwise was within normal limits. Point-of-care troponin was negative. CBC was unremarkable. Urinalysis not done. EKG with normal sinus rhythm with some T-wave inversions in leads 2,3 and aVF which is very minimal. Patient seen in consultation by cardiology who recommended patient be admitted to be monitored on telemetry, and if no arrhythmias noted overnight patient may be set up for a Holter monitor in the outpatient setting.  #1 syncope Patient with extensive complex cardiac history presented with a syncopal episode. Concern for cardiogenic etiology. Patient with no focal neurological symptoms. Place in observation to monitor for any arrhythmias. Check a TSH, d-dimer, cardiac enzymes every 6 hours 3, UA with cultures and sensitivities, CT head. Patient be monitored on telemetry. CT head is negative and no etiology noted.  Cardiology following and appreciate input and recommendations.  No further inpatient work up recommended.  Pt is to follow up for outpatient event monitor to be placed by Dr. Swaziland on outpatient follow up.  Coreg dose 25 mg BID down from 37.5 mg BID.    #2 hypothyroidism Stable TSH WNL. Continue home dose Synthroid.  #3 major depression Stable  Continue Wellbutrin.   #4 gastroesophageal reflux disease PPI.  #5 chronic systolic heart failure/nonischemic cardiomyopathy Currently stable. 2-D echo from 06/28/2016 with a EF of 40-45%, diffuse hypokinesis, grade 1 diastolic dysfunction. Patient euvolemic on examination. Continue home regimen of Coreg, Lasix, spironolactone, Cozaar. Cardiology outpatient follow up recommended.  #6 hypertension Stable. Continue Coreg, Lasix.  #7 chronic pain/Fibromyalgia Continue home regimen of pain medications, continue Wellbutrin..  #8 Sjogrens syndrome  Continue  home regimen of medications. Outpatient follow-up with rheumatologist.  Discharge Diagnoses:  Principal Problem:   Syncope Active Problems:   Hypothyroidism   Major depression in complete remission (HCC)   Essential hypertension   GERD   Chronic systolic congestive heart failure [facility])  Discharge Instructions: Discharge Instructions    Call MD for:  difficulty breathing, headache or visual disturbances    Complete by:  As directed    Call MD for:  extreme fatigue    Complete by:  As directed    Call MD for:  persistant dizziness or light-headedness    Complete by:  As directed    Call MD for:  persistant nausea and vomiting    Complete by:  As directed    Call MD for:  severe uncontrolled pain    Complete by:  As directed    Call MD for:  temperature >100.4    Complete by:  As directed    Diet - low sodium heart healthy    Complete by:  As directed    Increase activity slowly    Complete by:  As directed      Allergies as of 01/15/2017      Reactions   Adhesive [tape]    Redness    Gabapentin (once-daily)    Depression    Sulfa Antibiotics    itching   Codeine Itching      Medication List    STOP taking these medications   traMADol 50 MG tablet Commonly known as:  ULTRAM     TAKE these medications   acetaminophen 500 MG tablet Commonly known as:  TYLENOL Take 1,000 mg by mouth every 4 (four) hours as needed for moderate pain or headache.   BENEFIBER Powd Take 2 scoop by mouth daily as needed (for constipation).   buPROPion 300 MG 24 hr tablet Commonly known as:  WELLBUTRIN XL Take 1 tablet (300 mg total) by mouth daily.   carvedilol 25 MG tablet Commonly known as:  COREG Take 1 tablet (25 mg total) by mouth 2 (two) times daily with a meal.   cevimeline 30 MG capsule Commonly known as:  EVOXAC Take 30 mg by mouth daily.   clotrimazole-betamethasone lotion Commonly known as:  LOTRISONE Apply topically 2 (two) times daily.   COLACE PO Take 1  tablet by mouth as needed (CONSTIPATION).   cyclobenzaprine 5 MG tablet Commonly known as:  FLEXERIL Take 5 mg by mouth 2 (two) times daily.   diazepam 5 MG tablet Commonly known as:  VALIUM TAKE ONE (1) TABLET BY MOUTH EVERY SIX HOURS AS NEEDED   EXCEDRIN EXTRA STRENGTH PO Take 2 tablets by mouth 2 (two) times daily as needed (headaches).   furosemide 20 MG tablet Commonly known as:  LASIX TAKE 1 TABLET BY MOUTH DAILY   HYDROcodone-acetaminophen 5-325 MG tablet Commonly known as:  NORCO/VICODIN Take 1 tablet by mouth 3 (three) times daily as needed for moderate pain.   hydroxychloroquine 200 MG tablet Commonly known as:  PLAQUENIL Take 400 mg by mouth every morning.   levothyroxine 88 MCG tablet Commonly known as:  SYNTHROID, LEVOTHROID TAKE 1 TABLET BY MOUTH DAILY   losartan 100 MG tablet Commonly known as:  COZAAR TAKE 1 TABLET BY MOUTH DAILY   meloxicam 7.5 MG tablet Commonly known as:  MOBIC TAKE 1 TABLET BY MOUTH DAILY   omeprazole 40 MG capsule Commonly known as:  PRILOSEC TAKE 1 CAPSULE BY MOUTH BEFORE BREAKFAST   ONE TOUCH  ULTRA TEST test strip Generic drug:  glucose blood USE AS DIRECTED TO CHECK BLOOD SUGAR ONCE A DAY   OVER THE COUNTER MEDICATION Take 5 capsules by mouth daily. [employer] Optimals Vitamin & Minerals   OVER THE COUNTER MEDICATION Take 1 scoop by mouth daily. Ariix Magnecal D   oxyCODONE-acetaminophen 5-325 MG tablet Commonly known as:  PERCOCET/ROXICET Take 1 tablet by mouth daily as needed for severe pain.   spironolactone 25 MG tablet Commonly known as:  ALDACTONE Take 1 tablet (25 mg total) by mouth daily.   Vitamin D-3 5000 units Tabs Take 5,000 Units by mouth daily.   XIIDRA 5 % Soln Generic drug:  Lifitegrast Place 1 drop into both eyes daily as needed (dry eyes).            Discharge Care Instructions        Start     Ordered   01/15/17 0000  carvedilol (COREG) 25 MG tablet  2 times daily with meals      01/15/17 1151   01/15/17 0000  Increase activity slowly     01/15/17 1151   01/15/17 0000  Diet - low sodium heart healthy     01/15/17 1151   01/15/17 0000  Call MD for:  temperature >100.4     01/15/17 1151   01/15/17 0000  Call MD for:  persistant nausea and vomiting     01/15/17 1151   01/15/17 0000  Call MD for:  severe uncontrolled pain     01/15/17 1151   01/15/17 0000  Call MD for:  difficulty breathing, headache or visual disturbances     01/15/17 1151   01/15/17 0000  Call MD for:  persistant dizziness or light-headedness     01/15/17 1151   01/15/17 0000  Call MD for:  extreme fatigue     01/15/17 1151     Follow-up Information    [facility] Follow up on 01/29/2017.   Specialty:  Cardiology Why:  at 9:30 AM for event monitor.  Contact information: [address], [address] [place] [address] [phone]       [place], Peter M, MD Follow up on 03/04/2017.   Specialty:  Cardiology Why:  at 9:30 AM for follow up with his PA [facility] information: [address] [place] [address] [phone]        [name], MD. Schedule an appointment as soon as possible for a visit in 1 week(s).   Specialty:  Family Medicine Why:  Hospital Follow Up  Contact information: [address] [place] [address] [phone]          Allergies  Allergen Reactions  . Adhesive [Tape]     Redness   . Gabapentin (Once-Daily)     Depression   . Sulfa Antibiotics     itching  . Codeine Itching   Current Discharge Medication List    CONTINUE these medications which have CHANGED   Details  carvedilol (COREG) 25 MG tablet Take 1 tablet (25 mg total) by mouth 2 (two) times daily with a meal.      CONTINUE these medications which have NOT CHANGED   Details  acetaminophen (TYLENOL) 500 MG tablet Take 1,000 mg by mouth every 4 (four) hours as needed for moderate pain or headache.     Aspirin-Acetaminophen-Caffeine (EXCEDRIN EXTRA STRENGTH PO) Take 2 tablets by mouth 2 (two) times daily as needed (headaches).    buPROPion (  WELLBUTRIN XL) 300 MG 24 hr tablet Take 1 tablet (300 mg total) by mouth daily. Qty: 30 tablet, Refills: 11    cevimeline (EVOXAC) 30 MG capsule Take 30 mg by mouth daily.     Cholecalciferol (VITAMIN D-3) 5000 UNITS TABS Take 5,000 Units by mouth daily.     clotrimazole-betamethasone (LOTRISONE) lotion Apply topically 2 (two) times daily. Qty: 30 mL, Refills: 0    cyclobenzaprine (FLEXERIL) 5 MG tablet Take 5 mg by mouth 2 (two) times daily.     diazepam (VALIUM) 5 MG tablet TAKE ONE (1) TABLET BY MOUTH EVERY SIX HOURS AS NEEDED Qty: 30 tablet, Refills: 5    Docusate Sodium (COLACE PO) Take 1 tablet by mouth as needed (CONSTIPATION).     furosemide (LASIX) 20 MG tablet TAKE 1 TABLET BY MOUTH DAILY Qty: 90 tablet, Refills: 0    HYDROcodone-acetaminophen (NORCO/VICODIN) 5-325 MG tablet Take 1 tablet by mouth 3 (three) times daily as needed for moderate pain. Qty: 60 tablet, Refills: 0    hydroxychloroquine (PLAQUENIL) 200 MG tablet Take 400 mg by mouth every morning.     levothyroxine (SYNTHROID, LEVOTHROID) 88 MCG tablet TAKE 1 TABLET BY MOUTH DAILY Qty: 30 tablet, Refills: 2    losartan (COZAAR) 100 MG tablet TAKE 1 TABLET BY MOUTH DAILY Qty: 30 tablet, Refills: 1    meloxicam (MOBIC) 7.5 MG tablet TAKE 1 TABLET BY MOUTH DAILY Qty: 30 tablet, Refills: 5    omeprazole (PRILOSEC) 40 MG capsule TAKE 1 CAPSULE BY MOUTH BEFORE BREAKFAST Qty: 30 capsule, Refills: 1    ONE TOUCH ULTRA TEST test strip USE AS DIRECTED TO CHECK BLOOD SUGAR ONCE A DAY Qty: 100 each, Refills: 3    !! OVER THE COUNTER MEDICATION Take 5 capsules by mouth daily. [employer] Optimals Vitamin & Minerals     !! OVER THE COUNTER MEDICATION Take 1 scoop by mouth daily. Ariix Magnecal D     oxyCODONE-acetaminophen (PERCOCET/ROXICET) 5-325 MG tablet Take 1 tablet by mouth  daily as needed for severe pain.     spironolactone (ALDACTONE) 25 MG tablet Take 1 tablet (25 mg total) by mouth daily. Qty: 90 tablet, Refills: 3    Wheat Dextrin (BENEFIBER) POWD Take 2 scoop by mouth daily as needed (for constipation).     XIIDRA 5 % SOLN Place 1 drop into both eyes daily as needed (dry eyes).      !! - Potential duplicate medications found. Please discuss with provider.    STOP taking these medications     traMADol (ULTRAM) 50 MG tablet         Procedures/Studies: Dg Chest 2 View  Result Date: 01/14/2017 CLINICAL DATA:  Dizziness, lightheadedness, chest pain EXAM: CHEST  2 VIEW COMPARISON:  06/25/2016 FINDINGS: Heart and mediastinal contours are within normal limits. No focal opacities or effusions. No acute bony abnormality. IMPRESSION: No active cardiopulmonary disease. Electronically Signed   By: Charlett Nose M.D.   On: 01/14/2017 10:23   Ct Head Wo Contrast  Result Date: 01/14/2017 CLINICAL DATA:  Altered level of consciousness EXAM: CT HEAD WITHOUT CONTRAST TECHNIQUE: Contiguous axial images were obtained from the base of the skull through the vertex without intravenous contrast. COMPARISON:  10/02/2006 FINDINGS: Brain: No evidence of acute infarction, hemorrhage, hydrocephalus, extra-axial collection or mass lesion/mass effect. Vascular: No hyperdense vessel or unexpected calcification. Skull: Normal. Negative for fracture or focal lesion. Sinuses/Orbits: No acute finding. Other: None IMPRESSION: No acute intracranial abnormality. No significant change from prior CT. Electronically Signed  By: Tollie Eth M.D.   On: 01/14/2017 19:23      Subjective: Pt without complaints.    Discharge Exam: Vitals:   01/15/17 0048 01/15/17 0500  BP: 102/63 (!) 112/58  Pulse: 91 86  Resp: 20 20  Temp: 97.8 F (36.6 C) (!) 97.4 F (36.3 C)  SpO2: 99% 100%   Vitals:   01/14/17 1930 01/14/17 2044 01/15/17 0048 01/15/17 0500  BP:   102/63 (!) 112/58  Pulse: 92  91  86  Resp: (!) 26 (!) Temp:  98.7 F (37.1 C) 97.8 F (36.6 C) (!) 97.4 F (36.3 C)  TempSrc:  Oral Oral Oral  SpO2: 96% 100% 99% 100%  Weight:  102.2 kg (225 lb 4.8 oz)  102.8 kg (226 lb 9.6 oz)  Height:   (1.6 m)     General: Pt is alert, awake, not in acute distress Cardiovascular: RRR, S1/S2 +, no rubs, no gallops Respiratory: CTA bilaterally, no wheezing, no rhonchi Abdominal: Soft, NT, ND, bowel sounds + Extremities: no edema, no cyanosis   The results of significant diagnostics from this hospitalization (including imaging, microbiology, ancillary and laboratory) are listed below for reference.     Microbiology: No results found for this or any previous visit (from the past 240 hour(s)).   Labs: BNP (last 3 results)  Recent Labs  07/10/16 0759  BNP 51.4   Basic Metabolic Panel:  Recent Labs Lab 01/14/17 0959 01/15/17 0035 01/15/17 0625  NA 139  --  139  K 3.9  --  3.6  CL 105  --  109  CO2 25  --  24  GLUCOSE 104*  --  126*  BUN 13  --  16  CREATININE 0.91  --  0.98  CALCIUM 9.4  --  8.6*  MG  --  1.8  --   PHOS  --  4.4  --    Liver Function Tests:  Recent Labs Lab 01/15/17 0625  AST 27  ALT 28  ALKPHOS 64  BILITOT 0.5  PROT 5.8*  ALBUMIN 3.6   No results for input(s): LIPASE, AMYLASE in the last 168 hours. No results for input(s): AMMONIA in the last 168 hours. CBC:  Recent Labs Lab 01/14/17 0959 01/15/17 0625  WBC 5.4 5.2  HGB 12.7 12.2  HCT 38.4 37.0  MCV 92.3 93.0  PLT 239 222   Cardiac Enzymes:  Recent Labs Lab 01/14/17 1853 01/15/17 0035 01/15/17 0625  TROPONINI <0.03 <0.03 <0.03   BNP: Invalid input(s): POCBNP CBG:  Recent Labs Lab 01/15/17 0612  GLUCAP 89   D-Dimer  Recent Labs  01/14/17 1853  DDIMER 0.41   Hgb A1c No results for input(s): HGBA1C in the last 72 hours. Lipid Profile No results for input(s): CHOL, HDL, LDLCALC, TRIG, CHOLHDL, LDLDIRECT in the last 72 hours. Thyroid  function studies  Recent Labs  01/14/17 1853  TSH 3.036   Anemia work up No results for input(s): VITAMINB12, FOLATE, FERRITIN, TIBC, IRON, RETICCTPCT in the last 72 hours. Urinalysis    Component Value Date/Time   COLORURINE YELLOW 01/15/2017 0011   APPEARANCEUR HAZY (A) 01/15/2017 0011   LABSPEC 1.018 01/15/2017 0011   PHURINE 6.0 01/15/2017 0011   GLUCOSEU NEGATIVE 01/15/2017 0011   HGBUR NEGATIVE 01/15/2017 0011   BILIRUBINUR NEGATIVE 01/15/2017 0011   BILIRUBINUR negative 05/09/2016 0948   KETONESUR NEGATIVE 01/15/2017 0011   PROTEINUR NEGATIVE 01/15/2017 0011   UROBILINOGEN negative 05/09/2016 0948   UROBILINOGEN 0.2  10/05/2008 0754   NITRITE NEGATIVE 01/15/2017 0011   LEUKOCYTESUR NEGATIVE 01/15/2017 0011   Sepsis Labs Invalid input(s): PROCALCITONIN,  WBC,  LACTICIDVEN Microbiology No results found for this or any previous visit (from the past 240 hour(s)).  Time coordinating discharge:   SIGNED:  Standley Dakins, MD  [facility] 01/15/2017, 11:51 AM Pager [phone]  If 7PM-7AM, please contact night-coverage [email or web address] Password [email or web address]

## 2017-01-15 NOTE — Progress Notes (Addendum)
Progress Note  Patient Name: Kristine Curtis Date of Encounter: 01/15/2017  Primary Cardiologist: Dr. [place] EP:  Dr. Ladona Ridgel  Subjective   No complaints but she notes she could not stand for 3 min for orthostatics due to lightheadedness.  No chest pain or SOB.  Inpatient Medications    Scheduled Meds: . buPROPion  300 mg Oral Daily  . carvedilol  25 mg Oral BID WC  . cevimeline  30 mg Oral Daily  . cholecalciferol  5,000 Units Oral Daily  . clotrimazole   Topical BID  . cyclobenzaprine  5 mg Oral BID  . enoxaparin (LOVENOX) injection  40 mg Subcutaneous Q24H  . furosemide  20 mg Oral Daily  . hydroxychloroquine  400 mg Oral BH-q7a  . levothyroxine  88 mcg Oral QAC breakfast  . losartan  100 mg Oral Daily  . meloxicam  7.5 mg Oral Daily  . pantoprazole  80 mg Oral Daily  . senna  1 tablet Oral BID  . sodium chloride flush  3 mL Intravenous Q12H  . spironolactone  25 mg Oral Daily   Continuous Infusions: . sodium chloride 100 mL/hr at 01/14/17 2157   PRN Meds: acetaminophen, alum & mag hydroxide-simeth, aspirin-acetaminophen-caffeine, diazepam, HYDROcodone-acetaminophen, ondansetron **OR** ondansetron (ZOFRAN) IV, oxyCODONE-acetaminophen, senna-docusate, sorbitol, traMADol   Vital Signs    Vitals:   01/14/17 1930 01/14/17 2044 01/15/17 0048 01/15/17 0500  BP:   102/63 (!) 112/58  Pulse: 92  91 86  Resp: (!) 26 (!) 22 20 20   Temp:  98.7 F (37.1 C) 97.8 F (36.6 C) (!) 97.4 F (36.3 C)  TempSrc:  Oral Oral Oral  SpO2: 96% 100% 99% 100%  Weight:  225 lb 4.8 oz (102.2 kg)  226 lb 9.6 oz (102.8 kg)  Height:  5\' 3"  (1.6 m)      Intake/Output Summary (Last 24 hours) at 01/15/17 0841 Last data filed at 01/15/17 2683  Gross per 24 hour  Intake          1208.34 ml  Output              400 ml  Net           808.34 ml   Filed Weights   01/14/17 2044 01/15/17 0500  Weight: 225 lb 4.8 oz (102.2 kg) 226 lb 9.6 oz (102.8 kg)    Telemetry    SR  - Personally  Reviewed  ECG    SR no new ones from yesterday - Personally Reviewed  Physical Exam   GEN: No acute distress.   Neck: No JVD Cardiac: RRR, no murmurs, rubs, or gallops.  Respiratory: Clear to auscultation bilaterally. GI: Soft, nontender, non-distended  MS: No edema; No deformity. Neuro:  Nonfocal  Psych: Normal affect   Labs    Chemistry Recent Labs Lab 01/14/17 0959 01/15/17 0625  NA 139 139  K 3.9 3.6  CL 105 109  CO2 25 24  GLUCOSE 104* 126*  BUN 13 16  CREATININE 0.91 0.98  CALCIUM 9.4 8.6*  PROT  --  5.8*  ALBUMIN  --  3.6  AST  --  27  ALT  --  28  ALKPHOS  --  64  BILITOT  --  0.5  GFRNONAA >60 >60  GFRAA >60 >60  ANIONGAP 9 6     Hematology Recent Labs Lab 01/14/17 0959 01/15/17 0625  WBC 5.4 5.2  RBC 4.16 3.98  HGB 12.7 12.2  HCT 38.4 37.0  MCV 92.3  93.0  MCH 30.5 30.7  MCHC 33.1 33.0  RDW 13.4 13.7  PLT 239 222    Cardiac Enzymes Recent Labs Lab 01/14/17 1853 01/15/17 0035 01/15/17 0625  TROPONINI <0.03 <0.03 <0.03    Recent Labs Lab 01/14/17 1019  TROPIPOC 0.00     BNPNo results for input(s): BNP, PROBNP in the last 168 hours.   DDimer  Recent Labs Lab 01/14/17 1853  DDIMER 0.41     Radiology    Dg Chest 2 View  Result Date: 01/14/2017 CLINICAL DATA:  Dizziness, lightheadedness, chest pain EXAM: CHEST  2 VIEW COMPARISON:  06/25/2016 FINDINGS: Heart and mediastinal contours are within normal limits. No focal opacities or effusions. No acute bony abnormality. IMPRESSION: No active cardiopulmonary disease. Electronically Signed   By: Charlett Nose M.D.   On: 01/14/2017 10:23   Ct Head Wo Contrast  Result Date: 01/14/2017 CLINICAL DATA:  Altered level of consciousness EXAM: CT HEAD WITHOUT CONTRAST TECHNIQUE: Contiguous axial images were obtained from the base of the skull through the vertex without intravenous contrast. COMPARISON:  10/02/2006 FINDINGS: Brain: No evidence of acute infarction, hemorrhage, hydrocephalus,  extra-axial collection or mass lesion/mass effect. Vascular: No hyperdense vessel or unexpected calcification. Skull: Normal. Negative for fracture or focal lesion. Sinuses/Orbits: No acute finding. Other: None IMPRESSION: No acute intracranial abnormality. No significant change from prior CT. Electronically Signed   By: Tollie Eth M.D.   On: 01/14/2017 19:23    Cardiac Studies   Echo today   Patient Profile     56 y.o. female with a hx of nonischemic cardiomyopathy, systolic and diastolic heart failure, hx of PACs and PVCs by holter monitor, fibromyalgia, restless leg syndrome, HTN, and depression who is being seen today for the evaluation of syncope.  Assessment & Plan    1. Syncope - one episode of syncope and one episode of near-syncope, both preceded by feelings of lightheadedness/dizziness - telemetry with NSR no abnormalities  - holter in 2016 with rare PAC and PVC - orthostatic pressures stable at lying, sitting, standing but later could not stand --has rec'd fluids overnight will recheck.  - electrolyts WNL - will plan event monitor at discharge  2. Nonischemic cardiomyopathy, chronic systolic and diastolic heart failure - last echo with LVEF of 40-45%--echo pending - continue home medications - does not appear volume overloaded on exam  3. Atypical CP - trop negative - given her recent stress test and heart cath, chest discomfort is likely not anginal or ACS in nature- no further chest discomfort  No other recs at this time. Will sign off.  Call with any questions.   For questions or updates, please contact [facility] Please consult [email or web address] for contact info under Cardiology/STEMI.      Signed, Nada Boozer, NP  01/15/2017, 8:41 AM    Patient seen and independently examined with Nada Boozer, NP. We discussed all aspects of the encounter. I agree with the assessment and plan as stated above.  Patient denies any further CP.  Still feels dizzy some when  standing but orthostatics are normal.  No arrhythmias on tele.  Trop neg x 3 and EKG with no acute ST changes.  Exam shows clear lungs, heart RRR with no M/R/G and no LE edema.  No further inpt workup.  Will get an outpt event monitor with followup with Dr. Ladonna Snide.   Signed: Armanda Magic, MD [facility] 01/15/2017

## 2017-01-15 NOTE — Progress Notes (Addendum)
New Admission Note:   Arrival Method: Bed/from ED Mental Orientation: Alert and oriented x 4 Telemetry: 3east box 29 Assessment: Completed Skin: Intact Tubes: None Safety Measures: Safety Fall Prevention Plan has been discussed  Admission:  3 East Orientation: Patient has been orientated to the room, unit and staff.  Family: husband at bedside  Orders to be reviewed and implemented. Will continue to monitor the patient. Call light has been placed within reach and bed alarm has been activated.   Bettey Mare, RN Phone: [phone]

## 2017-01-15 NOTE — Evaluation (Addendum)
Physical Therapy Evaluation and Discharge Patient Details Name: Kristine Curtis MRN: [id number] DOB: December 24, 1960 Today's Date: 01/15/2017   History of Present Illness  Kristine Curtis is a 56 y.o. female brought to ED after syncopal episode with c/o of chest pains and fatigue.  Prior medical history significant of complex cardiac history of nonischemic cardiomyopathy, systolic and diastolic heart failure, history of PACs and PVCs by Holter monitor, fibromyalgia, restless leg syndrome, hypertension, depression  Clinical Impression  Patient evaluated by Physical Therapy with no further acute PT needs identified. All education has been completed and the patient has no further questions. Pt reports repeated dizzy and fatiguing episodes that decrease her able to ambulate independently but during current evaluation pt is mod I with bed mobility, transfers and ambulation of 200 feet without AD and supervision for ascend/descend of 3 steps. See below for any follow-up Physical Therapy or equipment needs. PT is signing off. Thank you for this referral.     Follow Up Recommendations No PT follow up    Equipment Recommendations  None recommended by PT       Precautions / Restrictions Precautions Precautions: Fall Restrictions Weight Bearing Restrictions: No      Mobility  Bed Mobility Overal bed mobility: Modified Independent             General bed mobility comments: used bedrails to pull up  Transfers Overall transfer level: Independent Equipment used: None                Ambulation/Gait Ambulation/Gait assistance: Modified independent (Device/Increase time) Ambulation Distance (Feet): 200 Feet Assistive device: None Gait Pattern/deviations: WFL(Within Functional Limits) Gait velocity: slowed Gait velocity interpretation: Below normal speed for age/gender General Gait Details: slow, safe, steady gait   Stairs Stairs: Yes Stairs assistance: Supervision Stair  Management: No rails;Step to pattern;Forwards;Sideways Number of Stairs: 3 General stair comments: due to previous back surgery pt requires step to pattern foward to ascend and sidestep pattern to descend PT suggest having railing placed on home stairs to aid in support      Balance Overall balance assessment: Needs assistance Sitting-balance support: Feet supported Sitting balance-Leahy Scale: Normal     Standing balance support: No upper extremity supported;During functional activity Standing balance-Leahy Scale: Good Standing balance comment: able to pull pants down and up withour support                              Pertinent Vitals/Pain Pain Assessment: No/denies pain    Home Living Family/patient expects to be discharged to:: Private residence Living Arrangements: Spouse/significant other Available Help at Discharge: Available PRN/intermittently;Family Type of Home: House Home Access: Stairs to enter Entrance Stairs-Rails: None Entrance Stairs-Number of Steps: 3 Home Layout: One level Home Equipment: None      Prior Function Level of Independence: Independent         Comments: does not go out when she is feeling symptomatic        Extremity/Trunk Assessment   Upper Extremity Assessment Upper Extremity Assessment: Overall WFL for tasks assessed    Lower Extremity Assessment Lower Extremity Assessment: Overall WFL for tasks assessed    Cervical / Trunk Assessment Cervical / Trunk Assessment: Kyphotic  Communication   Communication: No difficulties  Cognition Arousal/Alertness: Awake/alert Behavior During Therapy: WFL for tasks assessed/performed Overall Cognitive Status: Within Functional Limits for tasks assessed  General Comments General comments (skin integrity, edema, etc.): VSS        Assessment/Plan    PT Assessment Patient needs continued PT services         PT Goals  (Current goals can be found in the Care Plan section)  Acute Rehab PT Goals Patient Stated Goal: decrease dizzy episodes PT Goal Formulation: With patient    Frequency     Barriers to discharge           AM-PAC PT "6 Clicks" Daily Activity  Outcome Measure Difficulty turning over in bed (including adjusting bedclothes, sheets and blankets)?: None Difficulty moving from lying on back to sitting on the side of the bed? : None Difficulty sitting down on and standing up from a chair with arms (e.g., wheelchair, bedside commode, etc,.)?: None Help needed moving to and from a bed to chair (including a wheelchair)?: None Help needed walking in hospital room?: None Help needed climbing 3-5 steps with a railing? : A Little 6 Click Score: 23    End of Session Equipment Utilized During Treatment: Gait belt Activity Tolerance: Patient tolerated treatment well Patient left: in chair;with call bell/phone within reach;with chair alarm set;with family/visitor present Nurse Communication: Mobility status PT Visit Diagnosis: Other abnormalities of gait and mobility (R26.89)    Time: 1030-1056 PT Time Calculation (min) (ACUTE ONLY): 26 min   Charges:   PT Evaluation $PT Eval Low Complexity: 1 Low PT Treatments $Gait Training: 8-22 mins   PT G Codes:        Kristine Curtis PT, DPT Acute Rehabilitation  [phone] Pager [phone]    Kristine Curtis 01/15/2017, 11:17 AM

## 2017-01-15 NOTE — Progress Notes (Signed)
Omitted G-codes    01/15/17 1015  PT G-Codes **NOT FOR INPATIENT CLASS**  Functional Assessment Tool Used AM-PAC 6 Clicks Basic Mobility  Functional Limitation Mobility: Walking and moving around  Mobility: Walking and Moving Around Current Status (G2952) CI  Mobility: Walking and Moving Around Goal Status [phone]) CI  Mobility: Walking and Moving Around Discharge Status [phone]) CI   Lanora Manis B. Beverely Risen PT, DPT Acute Rehabilitation  [phone] Pager [phone]

## 2017-01-15 NOTE — Discharge Instructions (Signed)
Follow with Primary MD  Kristine Beat, MD  and other consultant's as instructed your Hospitalist MD  Please get a complete blood count and chemistry panel checked by your Primary MD at your next visit, and again as instructed by your Primary MD.  Get Medicines reviewed and adjusted: Please take all your medications with you for your next visit with your Primary MD  Laboratory/radiological data: Please request your Primary MD to go over all hospital tests and procedure/radiological results at the follow up, please ask your Primary MD to get all Hospital records sent to his/her office.  In some cases, they will be blood work, cultures and biopsy results pending at the time of your discharge. Please request that your primary care M.D. follows up on these results.  Also Note the following: If you experience worsening of your admission symptoms, develop shortness of breath, life threatening emergency, suicidal or homicidal thoughts you must seek medical attention immediately by calling 911 or calling your MD immediately  if symptoms less severe.  You must read complete instructions/literature along with all the possible adverse reactions/side effects for all the Medicines you take and that have been prescribed to you. Take any new Medicines after you have completely understood and accpet all the possible adverse reactions/side effects.   Do not drive when taking Pain medications or sleeping medications (Benzodaizepines)  Do not take more than prescribed Pain, Sleep and Anxiety Medications. It is not advisable to combine anxiety,sleep and pain medications without talking with your primary care practitioner  Special Instructions: If you have smoked or chewed Tobacco  in the last 2 yrs please stop smoking, stop any regular Alcohol  and or any Recreational drug use.  Wear Seat belts while driving.  Please note: You were cared for by a hospitalist during your hospital stay. Once you are  discharged, your primary care physician will handle any further medical issues. Please note that NO REFILLS for any discharge medications will be authorized once you are discharged, as it is imperative that you return to your primary care physician (or establish a relationship with a primary care physician if you do not have one) for your post hospital discharge needs so that they can reassess your need for medications and monitor your lab values.

## 2017-01-15 NOTE — Progress Notes (Signed)
Occupational Therapy Evaluation/Discharge Patient Details Name: Kristine Curtis MRN: [id number] DOB: May 23, 1960 Today's Date: 01/15/2017    History of Present Illness BURNESTINE SEIGFRIED is a 56 y.o. female brought to ED after syncopal episode with c/o of chest pains and fatigue.  Prior medical history significant of complex cardiac history of nonischemic cardiomyopathy, systolic and diastolic heart failure, history of PACs and PVCs by Holter monitor, fibromyalgia, restless leg syndrome, hypertension, depression   Clinical Impression   Patient evaluated by Occupational Therapy with no further acute OT needs identified. Pt able to complete all ADL in hospital setting with general supervision. She does demonstrate difficulty and increased effort with LB ADL and educated pt on use of AE such as sock aide and reacher. Additionally discussed energy conservation strategies with handout provided as pt with decreased activity tolerance for ADL. All education has been completed and the patient has no further questions. See below for any follow-up Occupational Therapy or equipment needs. OT to sign off. Thank you for referral.      Follow Up Recommendations  No OT follow up;Supervision/Assistance - 24 hour    Equipment Recommendations   (Sock-aide)    Recommendations for Other Services       Precautions / Restrictions Precautions Precautions: Fall Restrictions Weight Bearing Restrictions: No      Mobility Bed Mobility Overal bed mobility: Modified Independent             General bed mobility comments: Increased time  Transfers Overall transfer level: Independent Equipment used: None                  Balance Overall balance assessment: Needs assistance Sitting-balance support: Feet supported Sitting balance-Leahy Scale: Normal     Standing balance support: No upper extremity supported;During functional activity Standing balance-Leahy Scale: Good Standing balance  comment: Slightly unstable.                            ADL either performed or assessed with clinical judgement   ADL Overall ADL's : Needs assistance/impaired                                       General ADL Comments: Overall supervision for safety with ADL participation. Pt able to complete LB dressing tasks with increased time and effort. Educated on use of reacher and sock aide to maximize independence and safety. Additionally educated on energy conservation techniques as pt with decreased activity tolerance for ADL.      Vision   Vision Assessment?: No apparent visual deficits     Perception     Praxis      Pertinent Vitals/Pain Pain Assessment: No/denies pain     Hand Dominance     Extremity/Trunk Assessment Upper Extremity Assessment Upper Extremity Assessment: Overall WFL for tasks assessed   Lower Extremity Assessment Lower Extremity Assessment: Overall WFL for tasks assessed   Cervical / Trunk Assessment Cervical / Trunk Assessment: Kyphotic   Communication Communication Communication: No difficulties   Cognition Arousal/Alertness: Awake/alert Behavior During Therapy: WFL for tasks assessed/performed Overall Cognitive Status: Within Functional Limits for tasks assessed                                     General Comments  Exercises     Shoulder Instructions      Home Living Family/patient expects to be discharged to:: Private residence Living Arrangements: Spouse/significant other Available Help at Discharge: Available PRN/intermittently;Family Type of Home: House Home Access: Stairs to enter Secretary/administrator of Steps: 3 Entrance Stairs-Rails: None Home Layout: One level     Bathroom Shower/Tub: Chief Strategy Officer: Standard Bathroom Accessibility: No   Home Equipment: None          Prior Functioning/Environment Level of Independence: Independent        Comments:  Reports having trouble with LB dressing tasks although able to complete with difficulty.         OT Problem List: Decreased activity tolerance;Impaired balance (sitting and/or standing);Decreased knowledge of use of DME or AE      OT Treatment/Interventions:      OT Goals(Current goals can be found in the care plan section) Acute Rehab OT Goals Patient Stated Goal: decrease dizzy episodes OT Goal Formulation: With patient  OT Frequency:     Barriers to D/C:            Co-evaluation              AM-PAC PT "6 Clicks" Daily Activity     Outcome Measure Help from another person eating meals?: None Help from another person taking care of personal grooming?: A Little Help from another person toileting, which includes using toliet, bedpan, or urinal?: A Little Help from another person bathing (including washing, rinsing, drying)?: A Little Help from another person to put on and taking off regular upper body clothing?: A Little Help from another person to put on and taking off regular lower body clothing?: A Little 6 Click Score: 19   End of Session    Activity Tolerance: Patient tolerated treatment well Patient left:  (ambulating in room preparing for D/C)  OT Visit Diagnosis: Unsteadiness on feet (R26.81)                Time: 1520-1540 OT Time Calculation (min): 20 min Charges:  OT General Charges $OT Visit: 1 Visit OT Evaluation $OT Eval Moderate Complexity: 1 Mod G-Codes: OT G-codes **NOT FOR INPATIENT CLASS** Functional Assessment Tool Used: Clinical judgement Functional Limitation: Self care Self Care Current Status (Z6109): At least 1 percent but less than 20 percent impaired, limited or restricted Self Care Goal Status (U0454): At least 1 percent but less than 20 percent impaired, limited or restricted Self Care Discharge Status [phone]): At least 1 percent but less than 20 percent impaired, limited or restricted   Doristine Section, MS OTR/L  Pager:  [phone]   Kymber Kosar A Kerianna Rawlinson 01/15/2017, 3:59 PM

## 2017-01-16 ENCOUNTER — Telehealth: Payer: Self-pay

## 2017-01-16 LAB — URINE CULTURE

## 2017-01-16 NOTE — Telephone Encounter (Signed)
LM requesting call back to complete TCM and schedule hospital f/u with PCP.  

## 2017-01-17 NOTE — Telephone Encounter (Signed)
LM requesting call back to complete TCM and confirm hosp f/u.  

## 2017-01-20 ENCOUNTER — Telehealth: Payer: Self-pay | Admitting: Family Medicine

## 2017-01-20 NOTE — Telephone Encounter (Signed)
Third attempt at reaching patient for TCM. Hosp f/u appt 02/03/17.

## 2017-01-20 NOTE — Telephone Encounter (Signed)
Patient Name: Kristine Curtis  DOB: [birth year]    Initial Comment caller states she needs to schedule an appointment . she has been having dizziness and nasal congestion . She is also having difficulty breathing-( shortness of breath )   Nurse Assessment  Nurse: Stefano Gaul, RN, Dwana Curd Date/Time (Eastern Time): 01/20/2017 8:41:51 AM  Confirm and document reason for call. If symptomatic, describe symptoms. ---Caller states she has had nasal congestion. She coughs and coughs that is causing SOB. She can not take a deep breath. No chest pain. Nasal congestion started yesterday. Had dizziness and fainted last week. Went to the ER and stayed overnight. She is still having some dizziness. No fever. Swelling in feet and ankles.  Does the patient have any new or worsening symptoms? ---Yes  Will a triage be completed? ---Yes  Related visit to physician within the last 2 weeks? ---No  Does the PT have any chronic conditions? (i.e. diabetes, asthma, etc.) ---Yes  List chronic conditions. ---CHF  Is this a behavioral health or substance abuse call? 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 Does not want to see another provider or go to urgent care. She states she is seeing her rheumatologist this afternoon and will take to him about her congestion and dizziness.  Called back line and spoke to [name] and gave report that pt has sinus congestion, swelling in feet and ankles. She is SOB with exertion. Triage outcome of to ER now (or PCP triage. 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 Does not want to see another provider or go to urgent care. She states she is seeing her  rheumatologist this afternoon and will take to him about her congestion and dizziness.   Referrals  GO TO FACILITY REFUSED   Disagree/Comply: Comply    Disagree/Comply: Disagree  Disagree/Comply Reason: Disagree with instructions

## 2017-01-20 NOTE — Telephone Encounter (Signed)
Sch pt with Dr. Ermalene Searing 9/18 @ 3:15pm.

## 2017-01-20 NOTE — Telephone Encounter (Signed)
I spoke with pt; she has appt with rheumatologist this afternoon; pt is not having dizziness now; today congestion, drainage at back of throat. Pt does not want to schedule appt at [facility] but will address with rheumatologist later today.FYI to Dr Patsy Lager.

## 2017-01-20 NOTE — Telephone Encounter (Signed)
Caller Name:nurse Relationship to Patient: team health Best number: Pharmacy:  Reason for call:  Pt was triaged to go to ed.  She has sob, swelling in ankles and feet. She is going to see rheumatologist today. Pt declined to go to ed and declined to see a different provider

## 2017-01-21 ENCOUNTER — Encounter: Payer: Self-pay | Admitting: Family Medicine

## 2017-01-21 ENCOUNTER — Ambulatory Visit (INDEPENDENT_AMBULATORY_CARE_PROVIDER_SITE_OTHER): Payer: 59 | Admitting: Family Medicine

## 2017-01-21 DIAGNOSIS — H6983 Other specified disorders of Eustachian tube, bilateral: Secondary | ICD-10-CM

## 2017-01-21 DIAGNOSIS — R5383 Other fatigue: Secondary | ICD-10-CM | POA: Diagnosis not present

## 2017-01-21 DIAGNOSIS — H6980 Other specified disorders of Eustachian tube, unspecified ear: Secondary | ICD-10-CM | POA: Insufficient documentation

## 2017-01-21 DIAGNOSIS — G43109 Migraine with aura, not intractable, without status migrainosus: Secondary | ICD-10-CM | POA: Diagnosis not present

## 2017-01-21 DIAGNOSIS — M797 Fibromyalgia: Secondary | ICD-10-CM | POA: Insufficient documentation

## 2017-01-21 DIAGNOSIS — R5382 Chronic fatigue, unspecified: Secondary | ICD-10-CM

## 2017-01-21 DIAGNOSIS — R55 Syncope and collapse: Secondary | ICD-10-CM | POA: Diagnosis not present

## 2017-01-21 NOTE — Assessment & Plan Note (Signed)
Extensive lab eval for new cause completed with admission. Most likely secondary to other health issues and med SE.

## 2017-01-21 NOTE — Progress Notes (Signed)
   Subjective:    Patient ID: Kristine Curtis, female    DOB: 03-Dec-1960, 56 y.o.   MRN: [id number]  HPI    Review of Systems     Objective:   Physical Exam        Assessment & Plan:

## 2017-01-21 NOTE — Assessment & Plan Note (Addendum)
Headache sounds like migraine variant. No red falgs, nml neuro exam and neg head CT.  Pt will use acetaminophen for now. If not improving may need migraine prophylaxis/ [per pt she has been on may meds in past and has seen neurologist in past)

## 2017-01-21 NOTE — Progress Notes (Signed)
Subjective:    Patient ID: Kristine Curtis, female    DOB: [birth year], 56 y.o.   MRN: [id number]  HPI   56 year old female patient of Dr. Cyndie Chime with history of  Nonischemic cardiomyopathy, CHF, fibromyalgia, HTN presents for new onset multiple issues.  She was seen in ER on 01/14/2017 with syncopal episodes. Associated with dizziness, SOB, fatigue At ER she reported: Patient states 2 days prior to admission while standing up to put her shirt on she blacked out for a few seconds. Patient states episode was preceded by dizziness, lightheadedness. Patient also endorses a feeling of chest squeezing and fatigue. Patient denies any seizure-like activity. Patient stated that she did not fall nor hurt herself. Patient states one day prior to admission had a near syncopal episode with associated lightheadedness and dizziness, some shortness of breath, fatigue, a sensation of chest squeezing and overall feeling of heaviness and tiredness. Patient states episode was similar to that that occurred 2 days prior to admission except she did not black out totally. Patient endorses some nausea, dizziness, shortness of breath, bloating, chest pain with a sensation of squeezing, alternating diarrhea and constipation, streaks of blood noted on polyp April. Patient denies any fever, no chills, no emesis, no melena, no hematemesis, no productive cough.   CXR was unremarkable  troponin neg  Cbc , Bmet unremarkable EKG: NSR  head CT neg  Was admitted for overnight Obs for arrthmia.. On Holter monitor. No events noted.  Pt was to follow up for outpatient event monitor to be placed by Dr. [place] in outpatient follow up.  Coreg dose was decreased in June  to 25 mg BID down from 37.5 mg BID  Now in last 3 days.. Mucus in back of throat,post nasal drip,  drycough and shortness of breath form coughing. She reports she has had ear issues since a flight in February. During the flight she heard loud squeal in ears,  pressure in head.  She continued to have soreness in left neck and fullness in ears. She has started [employer] pot.Marland Kitchen Has helped mucus some. She is not on any nasal steroid or antihistmines. No vertigo, no current tinnitus, but has had off and on.   She also notes face pressure sharp shooting pain in eyes at times off and on.  She has history of migraine.  Stable weight. Wt Readings from Last 3 Encounters:  01/21/17 228 lb 8 oz (103.6 kg)  01/15/17 226 lb 9.6 oz (102.8 kg)  12/30/16 228 lb 12 oz (103.8 kg)   Blood pressure 104/70, pulse 87, temperature 98.5 F (36.9 C), temperature source Oral, height 5' 3.25" (1.607 m), weight 228 lb 8 oz (103.6 kg), SpO2 97 %. BP Readings from Last 3 Encounters:  01/21/17 104/70  01/15/17 (!) 112/58  12/30/16 100/78    Review of Systems  Constitutional: Positive for fatigue. Negative for fever.  HENT: Negative for congestion.   Eyes: Negative for pain.  Respiratory: Negative for cough and shortness of breath.   Cardiovascular: Negative for chest pain, palpitations and leg swelling.  Gastrointestinal: Negative for abdominal pain.  Genitourinary: Negative for dysuria and vaginal bleeding.  Musculoskeletal: Negative for back pain.  Neurological: Positive for syncope, light-headedness and headaches.  Psychiatric/Behavioral: Negative for dysphoric mood.       Objective:   Physical Exam  Constitutional: She is oriented to person, place, and time. Vital signs are normal. She appears well-developed and well-nourished. She is cooperative.  Non-toxic appearance. She does not appear  ill. No distress.  HENT:  Head: Normocephalic.  Right Ear: Hearing, external ear and ear canal normal. Tympanic membrane is not erythematous, not retracted and not bulging. A middle ear effusion is present.  Left Ear: Hearing, external ear and ear canal normal. Tympanic membrane is not erythematous, not retracted and not bulging. A middle ear effusion is present.  Nose: No  mucosal edema or rhinorrhea. Right sinus exhibits no maxillary sinus tenderness and no frontal sinus tenderness. Left sinus exhibits no maxillary sinus tenderness and no frontal sinus tenderness.  Mouth/Throat: Uvula is midline, oropharynx is clear and moist and mucous membranes are normal.  Eyes: Pupils are equal, round, and reactive to light. Conjunctivae, EOM and lids are normal. Lids are everted and swept, no foreign bodies found.  Neck: Trachea normal and normal range of motion. Neck supple. Carotid bruit is not present. No thyroid mass and no thyromegaly present.  Cardiovascular: Normal rate, regular rhythm, S1 normal, S2 normal, normal heart sounds, intact distal pulses and normal pulses.  Exam reveals no gallop and no friction rub.   No murmur heard. Pulmonary/Chest: Effort normal and breath sounds normal. No tachypnea. No respiratory distress. She has no decreased breath sounds. She has no wheezes. She has no rhonchi. She has no rales.  Abdominal: Soft. Normal appearance and bowel sounds are normal. There is no tenderness.  Neurological: She is alert and oriented to person, place, and time. She has normal strength and normal reflexes. No cranial nerve deficit or sensory deficit. She exhibits normal muscle tone. She displays a negative Romberg sign. Coordination and gait normal. GCS eye subscore is 4. GCS verbal subscore is 5. GCS motor subscore is 6.  Nml cerebellar exam   No papilledema  Skin: Skin is warm, dry and intact. No rash noted.  Psychiatric: She has a normal mood and affect. Her speech is normal and behavior is normal. Judgment and thought content normal. Her mood appears not anxious. Cognition and memory are normal. Cognition and memory are not impaired. She does not exhibit a depressed mood. She exhibits normal recent memory and normal remote memory.          Assessment & Plan:

## 2017-01-21 NOTE — Patient Instructions (Addendum)
Start nasal steroid spray 2 sprays per nostril daily. Continue nasal saline and  netty pot.  Complete heart evaluation. Discuss with cardiologist lower blood pressures.

## 2017-01-21 NOTE — Assessment & Plan Note (Signed)
Reviewed hospital eval in detail. Imaging and labs negative for source of syncope and fatigue.  No clear neuro source.  In process of cardiogenic syncope eval.  BP remains low.. Orthostatic hypotension possible.. Pt will discuss decrease in emdicaiotn at upcoming cardiology follow up.

## 2017-01-21 NOTE — Assessment & Plan Note (Signed)
Treat with nasal steroid and saline spray/netty pot.  If not improving in 2 weeks, consider course of prednisone.

## 2017-01-28 ENCOUNTER — Telehealth: Payer: Self-pay

## 2017-01-28 NOTE — Telephone Encounter (Signed)
Pt seen 01/21/17; netty pot and flonase has helped post nasal drip but not helping with ear problem. Pt having some pain and swelling on both sides of face and neck; seems worse at nighttime.No fever per pt. Pt wants to know if should have abx. Per 01/21/17 office note mentioned course of prednisone, pt request cb after Dr Ermalene Searing reviews. [place].

## 2017-01-29 ENCOUNTER — Ambulatory Visit (INDEPENDENT_AMBULATORY_CARE_PROVIDER_SITE_OTHER): Payer: 59

## 2017-01-29 DIAGNOSIS — R55 Syncope and collapse: Secondary | ICD-10-CM | POA: Diagnosis not present

## 2017-01-30 NOTE — Telephone Encounter (Signed)
Kristine Curtis notified as instructed by telephone.  She is scheduled to see Dr. Patsy Lager on Monday for her hospital follow up and states she has waited 2 days for a return call so she will just continue to suffer and address this with Dr. Patsy Lager on Monday at her appointment.

## 2017-01-30 NOTE — Telephone Encounter (Signed)
At OV there was no clear sign of bacterial infection. Recommend course of oral prednsione for Eustacian tube dysfunction or have pt referral/return to ENT. Let me know if pt agreeable to one of these options.

## 2017-02-03 ENCOUNTER — Ambulatory Visit (INDEPENDENT_AMBULATORY_CARE_PROVIDER_SITE_OTHER): Payer: 59 | Admitting: Family Medicine

## 2017-02-03 ENCOUNTER — Inpatient Hospital Stay: Payer: 59 | Admitting: Family Medicine

## 2017-02-03 ENCOUNTER — Telehealth: Payer: Self-pay | Admitting: Cardiology

## 2017-02-03 ENCOUNTER — Encounter: Payer: Self-pay | Admitting: Family Medicine

## 2017-02-03 VITALS — BP 118/82 | HR 82 | Temp 98.3°F | Wt 226.2 lb

## 2017-02-03 DIAGNOSIS — H938X3 Other specified disorders of ear, bilateral: Secondary | ICD-10-CM | POA: Diagnosis not present

## 2017-02-03 DIAGNOSIS — R922 Inconclusive mammogram: Secondary | ICD-10-CM | POA: Insufficient documentation

## 2017-02-03 DIAGNOSIS — R531 Weakness: Secondary | ICD-10-CM | POA: Diagnosis not present

## 2017-02-03 DIAGNOSIS — R42 Dizziness and giddiness: Secondary | ICD-10-CM | POA: Diagnosis not present

## 2017-02-03 NOTE — Telephone Encounter (Signed)
Returned call to patient.Dr.Jordan's recommendations given.She stated she already takes Coreg 25 mg twice a day.Advised I will speak to Dr.Jordan and call you back.

## 2017-02-03 NOTE — Telephone Encounter (Signed)
I would reduce Coreg to 25 mg bid. Hold losartan for one day then resume at 50 mg daily. If BP doesn't improve let me know.  Sanaiya Welliver Swaziland MD, [facility]

## 2017-02-03 NOTE — Progress Notes (Signed)
Subjective:    Patient ID: Kristine Curtis, female    DOB: 1960-06-13, 56 y.o.   MRN: [id number]  HPI This is a 56 yo female, accompanied by her husband, who presents today for follow up of recent overnight hospital stay. She is currently wearing an event monitor and has a cardiology appointment later this month after the monitor is removed. She continues to have problems with her ears, drainage from right several days ago, sharp pain "in cartilage," some improvement with post nasal drainage with neti pot and fluticosone. No fevers. Ears are improving, but still don't feel normal. Feels dizzy, nauseous, weak. Good water intake, appetite normal. No vertigo. Ear pain started 2/18 during a flight.   Checking blood pressure multiple times a day. Running 90-low 100s/60-80. Blood pressure does not correlate with light headedness or migraines.   She reports that she "feels the same," that no one can figure out what is wrong with her.   Past Medical History:  Diagnosis Date  . Allergic rhinitis due to pollen   . Asthma   . Chronic combined systolic and diastolic CHF (congestive heart failure) (HCC)    a. 06/2016 Echo: EF 40-45%, Gr1 DD.  Marland Kitchen Depression   . Family history of polycystic kidney 11/16/2012   neg CT  . Fibromyalgia   . GERD (gastroesophageal reflux disease)   . Hypertension   . Hypothyroidism   . IBS (irritable bowel syndrome)   . Internal hemorrhoids   . Morbid obesity (HCC)   . NICM (nonischemic cardiomyopathy) (HCC)    a. 1999 Cath: nl cors;  b. EF prev as low as 35%;  c. 11/2015 Echo: Ef 40-45%;  d. 06/2016 Echo: EF 40-45%;  e. Lexiscan MV: fixed anterosepta/inferseptal defect w/o ischemia, EF 35%.  . Osteoarthritis   . PVC (premature ventricular contraction)   . Restless leg syndrome   . Sicca (HCC) 11/22/2014  . Vertigo    Past Surgical History:  Procedure Laterality Date  . ABDOMINAL HYSTERECTOMY    . CESAREAN SECTION    . CHOLECYSTECTOMY  10-2008  . COLONOSCOPY    .  ESOPHAGOGASTRODUODENOSCOPY    . EYE SURGERY     2 2013 and 1981/DCR of right eye 05/2014  . LUMBAR DISC SURGERY  2016   L3-4  . RIGHT/LEFT HEART CATH AND CORONARY ANGIOGRAPHY N/A 07/11/2016   Procedure: Right/Left Heart Cath and Coronary Angiography;  Surgeon: Peter M Swaziland, MD;  Location: [facility] INVASIVE CV LAB;  Service: Cardiovascular;  Laterality: N/A;  . SHOULDER ARTHROSCOPY W/ SUBACROMIAL DECOMPRESSION AND DISTAL CLAVICLE EXCISION Right    rotator cuff repair  . TUBAL LIGATION  1992   Family History  Problem Relation Age of Onset  . Pancreatic cancer Mother   . Diabetes Father   . Heart disease Father   . Glaucoma Father   . Kidney failure Brother    Social History  Substance Use Topics  . Smoking status: Former Smoker    Packs/day: 2.00    Years: 12.00    Types: Cigarettes    Quit date: 05/06/1989  . Smokeless tobacco: Never Used     Comment: quit smoking 1990  . Alcohol use No      Review of Systems Per HPI    Objective:   Physical Exam  Constitutional: She is oriented to person, place, and time. She appears well-developed and well-nourished. No distress.  HENT:  Head: Normocephalic and atraumatic.  Right Ear: Tympanic membrane, external ear and ear canal normal. No  drainage, swelling or tenderness.  Left Ear: External ear and ear canal normal. No drainage, swelling or tenderness.  Mouth/Throat: Oropharynx is clear and moist.  Small area opacity lateral TM.  Eyes: Conjunctivae are normal.  Cardiovascular: Normal rate, regular rhythm and normal heart sounds.   Pulmonary/Chest: Effort normal and breath sounds normal.  Musculoskeletal: She exhibits no edema.  Lymphadenopathy:    She has no cervical adenopathy.  Neurological: She is alert and oriented to person, place, and time.  Skin: Skin is warm and dry. She is not diaphoretic.  Psychiatric: She has a normal mood and affect. Her behavior is normal. Judgment and thought content normal.  Vitals reviewed.     BP  118/82 (BP Location: Left Arm, Patient Position: Sitting, Cuff Size: Large)   Pulse 82   Temp 98.3 F (36.8 C) (Oral)   Wt 226 lb 4 oz (102.6 kg)   SpO2 99%   BMI 39.76 kg/m  Wt Readings from Last 3 Encounters:  02/03/17 226 lb 4 oz (102.6 kg)  01/21/17 228 lb 8 oz (103.6 kg)  01/15/17 226 lb 9.6 oz (102.8 kg)       Assessment & Plan:  1. Lightheadedness - labs done 01/15/17 included CBC, CMP- no need to repeat any labs today -unclear etiology, likely multifactoral - follow up with Dr. Patsy Lager as scheduled 02/12/17  2. Ear pressure, bilateral - some improvement, physical exam reassuring, continue fluticasone and neti pot - offered her referral to ENT- she declined at this time, will see if symptoms continue to improve  3. Weakness - blood pressure running a little low at home, does not correlate with lightheadedness - she will contact cardiology regarding BP    Olean Ree, FNP-BC  [facility], [facility]  02/03/2017 2:00 PM

## 2017-02-03 NOTE — Telephone Encounter (Signed)
Returned call to patient.Dr.Jordan advised to decrease Coreg to 12.5 mg twice a day.Advised to monitor B/P and call back if B/P does not improve.

## 2017-02-03 NOTE — Patient Instructions (Signed)
It was a pleasure to meet you today.  I hope you are feeling better soon.  Please contact cardiology regarding your blood pressure

## 2017-02-03 NOTE — Telephone Encounter (Signed)
Returned call to patient of Dr. Swaziland She reports low BP over the weekend. She took BP readings "multiple times each day". Highest BP was 103 systolic and averaged 90s/70s, she reports this is the first time that her BP has been persistently low. Her HR is in the 80s. She reports dizziness, lightheadedness & nausea. She has NOT passed out. She has total body fatigue. She had a migraine on Friday. She is currently wearing a heart monitor per Dr. Swaziland placed on 9/26.   Verified meds with patient - Dr. Thomes Lolling took her off med below (last dose 9/17) cevimeline (EVOXAC) 30 MG capsule Take 30 mg by mouth 3 (three) times daily as needed (dry mouth).   Routed to MD to review

## 2017-02-03 NOTE — Telephone Encounter (Signed)
New message    Pt c/o BP issue: STAT if pt c/o blurred vision, one-sided weakness or slurred speech  1. What are your last 5 BP readings?  103/70   90/69 all weekend it was low   2. Are you having any other symptoms (ex. Dizziness, headache, blurred vision, passed out)? Dizziness and nausea all weekend  migraine headache on Friday   3. What is your BP issue?  It was low all weekend , this has her concerned, still wearing event monitor

## 2017-02-05 ENCOUNTER — Telehealth: Payer: Self-pay | Admitting: Family Medicine

## 2017-02-05 NOTE — Telephone Encounter (Signed)
Patient would like to switch PCP. Wanted ok form both providers.

## 2017-02-07 NOTE — Telephone Encounter (Signed)
I have always had a good relationship with [name] and her entire family - nice family. I believe some issues arose due to my available in a large part from life threatening family emergency. My practice is significantly less than 50% primary care at this point.

## 2017-02-07 NOTE — Telephone Encounter (Signed)
I am okay with the switch if wanted. Just let me know.

## 2017-02-11 NOTE — Telephone Encounter (Signed)
Called to schedule NP appointment with Dr. Earlene Plater. Had to leave VM.

## 2017-02-12 ENCOUNTER — Other Ambulatory Visit: Payer: Self-pay | Admitting: Family Medicine

## 2017-02-12 ENCOUNTER — Ambulatory Visit (INDEPENDENT_AMBULATORY_CARE_PROVIDER_SITE_OTHER): Payer: 59 | Admitting: Family Medicine

## 2017-02-12 ENCOUNTER — Other Ambulatory Visit: Payer: Self-pay | Admitting: Cardiology

## 2017-02-12 ENCOUNTER — Encounter: Payer: Self-pay | Admitting: Family Medicine

## 2017-02-12 VITALS — BP 100/70 | HR 84 | Temp 98.7°F | Ht 63.25 in | Wt 227.5 lb

## 2017-02-12 DIAGNOSIS — I9589 Other hypotension: Secondary | ICD-10-CM | POA: Diagnosis not present

## 2017-02-12 DIAGNOSIS — R55 Syncope and collapse: Secondary | ICD-10-CM | POA: Diagnosis not present

## 2017-02-12 DIAGNOSIS — H6983 Other specified disorders of Eustachian tube, bilateral: Secondary | ICD-10-CM

## 2017-02-12 DIAGNOSIS — R42 Dizziness and giddiness: Secondary | ICD-10-CM

## 2017-02-12 MED ORDER — PREDNISONE 20 MG PO TABS: ORAL_TABLET | ORAL | 0 refills | Status: DC

## 2017-02-12 NOTE — Progress Notes (Signed)
Dr. Karleen Hampshire T. Jaqlyn Gruenhagen, MD, CAQ [facility] [address] [place], [address] Phone: [phone] Fax: [phone]  02/12/2017  Patient: Kristine Curtis, MRN: [id number], DOB: 26-Oct-1960, 56 y.o.  Primary Physician:  [name], MD   Chief Complaint  Patient presents with  . Follow-up    Ears   Subjective:   Kristine Curtis is a 56 y.o. very pleasant female patient who presents with the following:  Patient with a wealth of medical issues presents with follow-up regarding ongoing ear pain.  She has had some eustachian tube dysfunction.  She has been using some Flonase.  Pain is been intermittently severe.  She also had an admission approximately 1 month ago for syncope and collapse.  She is currently wearing a [employer] monitor.  She has upcoming follow-up with cardiology, and they recently decreased her Coreg.  Recent decrease of Coreg by Dr. Swaziland.  Recently syncope and hosp.  Still with a fluctuating BP.   Ears. Using Flonase.     Past Medical History, Surgical History, Social History, Family History, Problem List, Medications, and Allergies have been reviewed and updated if relevant.  Patient Active Problem List   Diagnosis Date Noted  . Failed back syndrome of lumbar spine 12/30/2016    Priority: High  . Chronic systolic congestive heart failure (HCC) 03/22/2015    Priority: High  . Hypothyroidism 08/30/2008    Priority: High  . Primary fibromyalgia syndrome 08/30/2008    Priority: High  . Sicca (HCC) 11/22/2014    Priority: Medium  . Dense breasts 02/03/2017  . ETD (eustachian tube dysfunction) 01/21/2017  . Fatigue 01/21/2017  . Syncope 01/14/2017  . Syncope and collapse   . Chronic low back pain with sciatica 05/02/2015  . Obesity (BMI 30-39.9) 03/22/2015  . Family history of polycystic kidney 11/16/2012  . UNSPECIFIED VITAMIN D DEFICIENCY 10/09/2009  . Other and unspecified hyperlipidemia  11/15/2008  . Major depression in complete remission (HCC) 08/31/2008  . Essential hypertension 08/31/2008  . ALLERGIC RHINITIS 08/31/2008  . ASTHMA 08/31/2008  . GERD 08/31/2008  . Osteoarthritis 08/31/2008  . Classical migraine without intractable migraine 08/30/2008  . Abnormal Pap smear of cervix 05/07/1995    Past Medical History:  Diagnosis Date  . Allergic rhinitis due to pollen   . Asthma   . Chronic combined systolic and diastolic CHF (congestive heart failure) (HCC)    a. 06/2016 Echo: EF 40-45%, Gr1 DD.  Marland Kitchen Depression   . Family history of polycystic kidney 11/16/2012   neg CT  . Fibromyalgia   . GERD (gastroesophageal reflux disease)   . Hypertension   . Hypothyroidism   . IBS (irritable bowel syndrome)   . Internal hemorrhoids   . Morbid obesity (HCC)   . NICM (nonischemic cardiomyopathy) (HCC)    a. 1999 Cath: nl cors;  b. EF prev as low as 35%;  c. 11/2015 Echo: Ef 40-45%;  d. 06/2016 Echo: EF 40-45%;  e. Lexiscan MV: fixed anterosepta/inferseptal defect w/o ischemia, EF 35%.  . Osteoarthritis   . PVC (premature ventricular contraction)   . Restless leg syndrome   . Sicca (HCC) 11/22/2014  . Vertigo     Past Surgical History:  Procedure Laterality Date  . ABDOMINAL HYSTERECTOMY    . CESAREAN SECTION    . CHOLECYSTECTOMY  10-2008  . COLONOSCOPY    . ESOPHAGOGASTRODUODENOSCOPY    . EYE SURGERY     2 2013 and 1981/DCR of  right eye 05/2014  . LUMBAR DISC SURGERY  2016   L3-4  . RIGHT/LEFT HEART CATH AND CORONARY ANGIOGRAPHY N/A 07/11/2016   Procedure: Right/Left Heart Cath and Coronary Angiography;  Surgeon: Peter M Swaziland, MD;  Location: [facility] INVASIVE CV LAB;  Service: Cardiovascular;  Laterality: N/A;  . SHOULDER ARTHROSCOPY W/ SUBACROMIAL DECOMPRESSION AND DISTAL CLAVICLE EXCISION Right    rotator cuff repair  . TUBAL LIGATION  1992    Social History   Social History  . Marital status: Married    Spouse name: N/A  . Number of children: 2  . Years of  education: N/A   Occupational History  . quality assurance tech    Social History Main Topics  . Smoking status: Former Smoker    Packs/day: 2.00    Years: 12.00    Types: Cigarettes    Quit date: 05/06/1989  . Smokeless tobacco: Never Used     Comment: quit smoking 1990  . Alcohol use No  . Drug use: No  . Sexual activity: Not on file   Other Topics Concern  . Not on file   Social History Narrative   Former Dr. Andrey Campanile then Dr. 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 Pancreatic cancer Mother   . Diabetes Father   . Heart disease Father   . Glaucoma Father   . Kidney failure Brother     Allergies  Allergen Reactions  . Adhesive [Tape]     Redness   . Gabapentin (Once-Daily)     Depression   . Sulfa Antibiotics     itching  . Codeine Itching    Medication list reviewed and updated in full in [facility].   GEN: No acute illnesses, no fevers, chills. GI: No n/v/d, eating normally Pulm: No SOB Interactive and getting along well at home.  Otherwise, ROS is as per the HPI.  Objective:   BP 100/70   Pulse 84   Temp 98.7 F (37.1 C) (Oral)   Ht 5' 3.25" (1.607 m)   Wt 227 lb 8 oz (103.2 kg)   SpO2 98%   BMI 39.98 kg/m   GEN: WDWN, NAD, Non-toxic, A & O x 3 HEENT: Atraumatic, Normocephalic. Neck supple. No masses, No LAD. B serous fluid on TM Ears and Nose: No external deformity. CV: RRR, No M/G/R. No JVD. No thrill. No extra heart sounds. PULM: CTA B, no wheezes, crackles, rhonchi. No retractions. No resp. distress. No accessory muscle use. EXTR: No c/c/e NEURO Normal gait.  PSYCH: Normally interactive. Conversant. Not depressed or anxious appearing.  Calm demeanor.   Laboratory and Imaging Data:  Assessment and Plan:   ETD (Eustachian tube dysfunction), bilateral  Postural dizziness with presyncope  Other specified hypotension   Ongoing ear pain and eustachian tube  dysfunction with failure basic medical management.  Continue with Flonase and netted pot.  Add oral steroids.  If this does not improve in a week, recommended that she follow up with ENT.  Modestly low blood pressure today in symptomatic syncope and presyncopal symptoms recently.  Recently dropped her beta blocker.  He would be reasonable to contemplate decreasing other medicines since she is symptomatic.  This will all depend on her [employer] and cardiology input given cardiomyopathy.  I will send a copy this note to her cardiologist.  Follow-up: she would like to transition her care to an osteopathic  physician, and I wished her well.  Future Appointments Date Time Provider Department Center  03/04/2017 9:30 AM Azalee Course, [place] [facility]    Meds ordered this encounter  Medications  . predniSONE (DELTASONE) 20 MG tablet    Sig: 2 tabs po for 5 days, then 1 tab for 3 days    Dispense:  13 tablet    Refill:  0   Signed,  Leata Dominy T. Darryle Dennie, MD   Allergies as of 02/12/2017      Reactions   Adhesive [tape]    Redness    Gabapentin (once-daily)    Depression    Sulfa Antibiotics    itching   Codeine Itching      Medication List       Accurate as of 02/12/17 11:59 PM. Always use your most recent med list.          acetaminophen 500 MG tablet Commonly known as:  TYLENOL Take 1,000 mg by mouth every 4 (four) hours as needed for moderate pain or headache.   BENEFIBER Powd Take 2 scoop by mouth daily as needed (for constipation).   buPROPion 300 MG 24 hr tablet Commonly known as:  WELLBUTRIN XL Take 1 tablet (300 mg total) by mouth daily.   carvedilol 25 MG tablet Commonly known as:  COREG Take 1/2 tablet ( 12.5 mg ) twice a day   clotrimazole-betamethasone lotion Commonly known as:  LOTRISONE Apply topically 2 (two) times daily.   COLACE PO Take 1 tablet by mouth as needed (CONSTIPATION).   cyclobenzaprine 5 MG tablet Commonly known as:   FLEXERIL Take 5 mg by mouth 2 (two) times daily.   diazepam 5 MG tablet Commonly known as:  VALIUM TAKE ONE (1) TABLET BY MOUTH EVERY SIX HOURS AS NEEDED   EXCEDRIN EXTRA STRENGTH PO Take 2 tablets by mouth 2 (two) times daily as needed (headaches).   furosemide 20 MG tablet Commonly known as:  LASIX TAKE 1 TABLET BY MOUTH DAILY   HYDROcodone-acetaminophen 5-325 MG tablet Commonly known as:  NORCO/VICODIN Take 1 tablet by mouth 3 (three) times daily as needed for moderate pain.   hydroxychloroquine 200 MG tablet Commonly known as:  PLAQUENIL Take 400 mg by mouth every morning.   levothyroxine 88 MCG tablet Commonly known as:  SYNTHROID, LEVOTHROID TAKE 1 TABLET BY MOUTH DAILY   losartan 100 MG tablet Commonly known as:  COZAAR TAKE 1 TABLET BY MOUTH DAILY   meloxicam 7.5 MG tablet Commonly known as:  MOBIC TAKE 1 TABLET BY MOUTH DAILY   omeprazole 40 MG capsule Commonly known as:  PRILOSEC TAKE 1 CAPSULE BY MOUTH BEFORE BREAKFAST   ONE TOUCH ULTRA TEST test strip Generic drug:  glucose blood USE AS DIRECTED TO CHECK BLOOD SUGAR ONCE A DAY   OVER THE COUNTER MEDICATION Take 5 capsules by mouth daily. [employer]ix Optimals Vitamin & Minerals   OVER THE COUNTER MEDICATION Take 1 scoop by mouth daily. Ariix Magnecal D   oxyCODONE-acetaminophen 5-325 MG tablet Commonly known as:  PERCOCET/ROXICET Take 1 tablet by mouth daily as needed for severe pain.   predniSONE 20 MG tablet Commonly known as:  DELTASONE 2 tabs po for 5 days, then 1 tab for 3 days   spironolactone 25 MG tablet Commonly known as:  ALDACTONE Take 1 tablet (25 mg total) by mouth daily.   Vitamin D-3 5000 units Tabs Take 5,000 Units by mouth daily.   XIIDRA 5 % Soln Generic drug:  Lifitegrast Place 1 drop  into both eyes daily as needed (dry eyes).

## 2017-02-13 ENCOUNTER — Ambulatory Visit (INDEPENDENT_AMBULATORY_CARE_PROVIDER_SITE_OTHER): Payer: 59 | Admitting: Physician Assistant

## 2017-02-13 ENCOUNTER — Encounter: Payer: Self-pay | Admitting: Cardiology

## 2017-02-13 ENCOUNTER — Telehealth: Payer: Self-pay | Admitting: Cardiology

## 2017-02-13 ENCOUNTER — Encounter: Payer: Self-pay | Admitting: Physician Assistant

## 2017-02-13 VITALS — Ht 63.0 in | Wt 227.6 lb

## 2017-02-13 DIAGNOSIS — R002 Palpitations: Secondary | ICD-10-CM | POA: Diagnosis not present

## 2017-02-13 DIAGNOSIS — E039 Hypothyroidism, unspecified: Secondary | ICD-10-CM | POA: Diagnosis not present

## 2017-02-13 DIAGNOSIS — R079 Chest pain, unspecified: Secondary | ICD-10-CM

## 2017-02-13 DIAGNOSIS — I10 Essential (primary) hypertension: Secondary | ICD-10-CM

## 2017-02-13 DIAGNOSIS — R42 Dizziness and giddiness: Secondary | ICD-10-CM | POA: Diagnosis not present

## 2017-02-13 DIAGNOSIS — I428 Other cardiomyopathies: Secondary | ICD-10-CM | POA: Diagnosis not present

## 2017-02-13 NOTE — Patient Instructions (Addendum)
Medication Instructions:   No changes to medications - continue with current regimen.  Labwork:   none  Testing/Procedures:  No new procedures - please continue wearing event monitor.  Follow-Up:  With Wynema Birch in 1 month (Nov 6th at 9:30am)   If you need a refill on your cardiac medications before your next appointment, please call your pharmacy.

## 2017-02-13 NOTE — Progress Notes (Signed)
Cardiology Office Note    Date:  02/15/2017   ID:  Kristine Curtis, DOB March 04, 1961, MRN [id number]  PCP:  [name], MD  Cardiologist:  Dr. Swaziland  Chief Complaint  Patient presents with  . Follow-up    seen for Dr. Swaziland  . Chest Pain    TIGHTNESS  . Palpitations    OCCASIONALLY.    History of Present Illness:  Kristine Curtis is a 56 y.o. female with PMH of NICM, PVCs, HTN, fibromyalgia, morbid obesity, hypothyroidism, RLS and vertigo. She had a cardiac catheterization in 1999 that showed normal coronaries. EF previously as low as 35%, EF improved to 40-45% by 2017. Cardiac MRI in 2017 showed EF 44% with global hypokinesis, no scar or infiltration. She was admitted in February 2018 with 24 hour history of chest pain, dyspnea and headache. Cardiac enzyme were negative. Limited echocardiogram showed EF 40-45% without pericardial effusion. Stress test showed fixed anteroseptal and inferoseptal defect without ischemia. EF was measured 35%. Despite Myoview showing no ischemia, she continued to have exertional chest discomfort and subsequently underwent a left and right heart cath 07/11/2016 which showed normal coronaries. He also had normal cardiac output and EF 35-40%. He had a sleep study that showed in the past no obstructive sleep apnea. She does have restless leg syndrome.  Most recently, patient was admitted in September with episode of syncope and another episode of presyncope. She denies any seizure-like activity. Serial troponin was negative. She did not appear to be volume overloaded. Outpatient 30 day event monitor was recommended. She was placed on a short course of steroid yesterday. She woke up this morning feeling her heart pounding. She called our office and arranged earlier visit. We were able to download today's monitor report. I have went over today's event monitor report with her, there was no evidence of an irregular rhythm. Although there were periods where her  heart rate was fast, she was clearly in sinus rhythm at the time. She said she felt like her heart was racing and after a brief pause, go back to normal rhythm. There were also time where her heart skip a beat. She says she was getting very dizzy and has to lay down in order for symptom to improve. We did obtain orthostatic vital signs today, although her heart rate did increase with change in body position, her blood pressure did not change significantly. Because of her blood pressure is borderline, I do not recommend increasing the medication. I recommend for her to continue on the current medication and to finish her 30 day event monitor. If her heart monitor does not show any significant arrhythmia, I would not recommend any further workup. I think some of her palpitations are related to the steroid therapy. Although she did have occasional chest discomfort, recent cardiac catheterization showed normal coronaries.   Past Medical History:  Diagnosis Date  . Allergic rhinitis due to pollen   . Asthma   . Chronic combined systolic and diastolic CHF (congestive heart failure) (HCC)    a. 06/2016 Echo: EF 40-45%, Gr1 DD.  Marland Kitchen Depression   . Family history of polycystic kidney 11/16/2012   neg CT  . Fibromyalgia   . GERD (gastroesophageal reflux disease)   . Hypertension   . Hypothyroidism   . IBS (irritable bowel syndrome)   . Internal hemorrhoids   . Morbid obesity (HCC)   . NICM (nonischemic cardiomyopathy) (HCC)    a. 1999 Cath: nl cors;  b. EF prev  as low as 35%;  c. 11/2015 Echo: Ef 40-45%;  d. 06/2016 Echo: EF 40-45%;  e. Lexiscan MV: fixed anterosepta/inferseptal defect w/o ischemia, EF 35%.  . Osteoarthritis   . PVC (premature ventricular contraction)   . Restless leg syndrome   . Sicca (HCC) 11/22/2014  . Vertigo     Past Surgical History:  Procedure Laterality Date  . ABDOMINAL HYSTERECTOMY    . CESAREAN SECTION    . CHOLECYSTECTOMY  10-2008  . COLONOSCOPY    .  ESOPHAGOGASTRODUODENOSCOPY    . EYE SURGERY     2 2013 and 1981/DCR of right eye 05/2014  . LUMBAR DISC SURGERY  2016   L3-4  . RIGHT/LEFT HEART CATH AND CORONARY ANGIOGRAPHY N/A 07/11/2016   Procedure: Right/Left Heart Cath and Coronary Angiography;  Surgeon: Peter M Swaziland, MD;  Location: [facility] INVASIVE CV LAB;  Service: Cardiovascular;  Laterality: N/A;  . SHOULDER ARTHROSCOPY W/ SUBACROMIAL DECOMPRESSION AND DISTAL CLAVICLE EXCISION Right    rotator cuff repair  . TUBAL LIGATION  1992    Current Medications: Outpatient Medications Prior to Visit  Medication Sig Dispense Refill  . acetaminophen (TYLENOL) 500 MG tablet Take 1,000 mg by mouth every 4 (four) hours as needed for moderate pain or headache.    . Aspirin-Acetaminophen-Caffeine (EXCEDRIN EXTRA STRENGTH PO) Take 2 tablets by mouth 2 (two) times daily as needed (headaches).    Marland Kitchen buPROPion (WELLBUTRIN XL) 300 MG 24 hr tablet Take 1 tablet (300 mg total) by mouth daily. 30 tablet 11  . carvedilol (COREG) 25 MG tablet Take 1/2 tablet ( 12.5 mg ) twice a day 60 tablet 6  . Cholecalciferol (VITAMIN D-3) 5000 UNITS TABS Take 5,000 Units by mouth daily.     . clotrimazole-betamethasone (LOTRISONE) lotion Apply topically 2 (two) times daily. 30 mL 0  . cyclobenzaprine (FLEXERIL) 5 MG tablet Take 5 mg by mouth 2 (two) times daily.     . diazepam (VALIUM) 5 MG tablet TAKE ONE (1) TABLET BY MOUTH EVERY SIX HOURS AS NEEDED 30 tablet 5  . Docusate Sodium (COLACE PO) Take 1 tablet by mouth as needed (CONSTIPATION).     . furosemide (LASIX) 20 MG tablet TAKE 1 TABLET BY MOUTH DAILY 90 tablet 0  . HYDROcodone-acetaminophen (NORCO/VICODIN) 5-325 MG tablet Take 1 tablet by mouth 3 (three) times daily as needed for moderate pain. 60 tablet 0  . hydroxychloroquine (PLAQUENIL) 200 MG tablet Take 400 mg by mouth every morning.     Marland Kitchen levothyroxine (SYNTHROID, LEVOTHROID) 88 MCG tablet TAKE 1 TABLET BY MOUTH DAILY 30 tablet 2  . meloxicam (MOBIC) 7.5 MG  tablet TAKE 1 TABLET BY MOUTH DAILY 30 tablet 3  . omeprazole (PRILOSEC) 40 MG capsule TAKE 1 CAPSULE BY MOUTH BEFORE BREAKFAST 30 capsule 1  . ONE TOUCH ULTRA TEST test strip USE AS DIRECTED TO CHECK BLOOD SUGAR ONCE A DAY 100 each 3  . OVER THE COUNTER MEDICATION Take 5 capsules by mouth daily. Ariix Optimals Vitamin & Minerals     . OVER THE COUNTER MEDICATION Take 1 scoop by mouth daily. Ariix Magnecal D     . oxyCODONE-acetaminophen (PERCOCET/ROXICET) 5-325 MG tablet Take 1 tablet by mouth daily as needed for severe pain.     . predniSONE (DELTASONE) 20 MG tablet 2 tabs po for 5 days, then 1 tab for 3 days 13 tablet 0  . spironolactone (ALDACTONE) 25 MG tablet Take 1 tablet (25 mg total) by mouth daily. 90 tablet 3  .  Wheat Dextrin (BENEFIBER) POWD Take 2 scoop by mouth daily as needed (for constipation).     Marland Kitchen XIIDRA 5 % SOLN Place 1 drop into both eyes daily as needed (dry eyes).     Marland Kitchen losartan (COZAAR) 100 MG tablet TAKE 1 TABLET BY MOUTH DAILY 30 tablet 0   Facility-Administered Medications Prior to Visit  Medication Dose Route Frequency Provider Last Rate Last Dose  . technetium tetrofosmin (TC-MYOVIEW) injection 29.1 millicurie  29.1 millicurie Intravenous Once PRN Lars Masson, MD         Allergies:   Adhesive [tape]; Gabapentin (once-daily); Sulfa antibiotics; and Codeine   Social History   Social History  . Marital status: Married    Spouse name: N/A  . Number of children: 2  . Years of education: N/A   Occupational History  . quality assurance tech    Social History Main Topics  . Smoking status: Former Smoker    Packs/day: 2.00    Years: 12.00    Types: Cigarettes    Quit date: 05/06/1989  . Smokeless tobacco: Never Used     Comment: quit smoking 1990  . Alcohol use No  . Drug use: No  . Sexual activity: Not Asked   Other Topics Concern  . None   Social History Narrative   Former Dr. Andrey Campanile then Dr. 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, podiatrist         Family History:  The patient's family history includes Diabetes in her father; Glaucoma in her father; Heart disease in her father; Kidney failure in her brother; Pancreatic cancer in her mother.   ROS:   Please see the history of present illness.    ROS All other systems reviewed and are negative.   PHYSICAL EXAM:   VS:  Ht 5\' 3"  (1.6 m)   Wt 227 lb 9.6 oz (103.2 kg)   BMI 40.32 kg/m    GEN: Well nourished, well developed, in no acute distress  HEENT: normal  Neck: no JVD, carotid bruits, or masses Cardiac: RRR; no murmurs, rubs, or gallops,no edema  Respiratory:  clear to auscultation bilaterally, normal work of breathing GI: soft, nontender, nondistended, + BS MS: no deformity or atrophy  Skin: warm and dry, no rash Neuro:  Alert and Oriented x 3, Strength and sensation are intact Psych: euthymic mood, full affect  Wt Readings from Last 3 Encounters:  02/13/17 227 lb 9.6 oz (103.2 kg)  02/12/17 227 lb 8 oz (103.2 kg)  02/03/17 226 lb 4 oz (102.6 kg)      Studies/Labs Reviewed:   EKG:  EKG is ordered today.  The ekg ordered today demonstrates Sinus rhythm, heart rate 97, no significant ST-T wave changes.  Recent Labs: 07/10/2016: BNP 51.4 01/14/2017: TSH 3.036 01/15/2017: ALT 28; BUN 16; Creatinine, Ser 0.98; Hemoglobin 12.2; Magnesium 1.8; Platelets 222; Potassium 3.6; Sodium 139   Lipid Panel    Component Value Date/Time   CHOL 165 03/20/2016 0836   TRIG (H) 03/20/2016 0836    419.0 Triglyceride is over 400; calculations on Lipids are invalid.   HDL 37.20 (L) 03/20/2016 0836   CHOLHDL 4 03/20/2016 0836   VLDL 18.6 02/02/2015 0901   LDLCALC 85 02/02/2015 0901   LDLDIRECT 75.0 03/20/2016 0836    Additional studies/ records that were reviewed today include:   Cath 07/11/2016 Conclusion     There is moderate left ventricular systolic dysfunction.  The left ventricular ejection fraction is 35-45% by visual estimate.  LV end diastolic pressure is mildly  elevated.   1. Normal coronary anatomy 2. Moderate LV dysfunction- global. EF 35-40%.  3. Mildly elevated LVEDP 4. Normal pulmonary pressures. RA pressure is elevated. 5. Normal cardiac output.  Plan: continue medical therapy. Consider addition of aldactone to current therapy       ASSESSMENT:    1. Dizziness   2. Palpitation   3. Chest pain, unspecified type   4. NICM (nonischemic cardiomyopathy) (HCC)   5. Essential hypertension   6. Hypothyroidism, unspecified type      PLAN:  In order of problems listed above:  1. Dizziness: We measured her orthostatic blood pressure today, there was no significant blood pressure drop, however her heart rate did increase when she changed body positions.  2. Palpitation: Despite palpitation, she is wearing a 30 day monitor, monitor report did not show any significant arrhythmia. She was in sinus tach with heart rate of 105. She still have 15 days left on her monitor. We'll finish the current monitor and reevaluate.  3. Atypical chest pain: Recent cardiac catheterization showed clean coronaries  4. Nonischemic cardiomyopathy: Continue carvedilol, losartan and Lasix. Losartan dose was recently decreased. If symptom worsens, I would reduce losartan to 25 mg daily.  5. Hypertension: Recently has been having borderline blood pressure. Losartan already decreased down to 50 mg daily  6. Hypothyroidism: Recent TSH on 01/14/2017 was normal.    Medication Adjustments/Labs and Tests Ordered: Current medicines are reviewed at length with the patient today.  Concerns regarding medicines are outlined above.  Medication changes, Labs and Tests ordered today are listed in the Patient Instructions below. Patient Instructions  Medication Instructions:   No changes to medications - continue with current regimen.  Labwork:   none  Testing/Procedures:  No new procedures - please continue wearing event monitor.  Follow-Up:  With Wynema Birch in 1  month (Nov 6th at 9:30am)   If you need a refill on your cardiac medications before your next appointment, please call your pharmacy.      Ramond Dial, [place]  02/15/2017 2:32 PM    [facility] [address] [place], [place], [place]  [address] Phone: [phone]; Fax: [phone]

## 2017-02-13 NOTE — Telephone Encounter (Signed)
Patient called this am c/o issues with heart rate being irregular this morning. Since 6:20 am. Has been having dizziness and low blood pressure. Was able to to get monitor readings from this am with heart rate up of 110. Patient scheduled to see Lanora Manis today

## 2017-02-13 NOTE — Telephone Encounter (Signed)
Please call,she have been having palpations non stop since 6:20.

## 2017-02-15 ENCOUNTER — Encounter: Payer: Self-pay | Admitting: Physician Assistant

## 2017-02-21 ENCOUNTER — Ambulatory Visit (INDEPENDENT_AMBULATORY_CARE_PROVIDER_SITE_OTHER): Payer: 59 | Admitting: Family Medicine

## 2017-02-21 ENCOUNTER — Encounter: Payer: Self-pay | Admitting: Family Medicine

## 2017-02-21 VITALS — BP 122/80 | HR 94 | Temp 98.6°F | Resp 30 | Wt 234.0 lb

## 2017-02-21 DIAGNOSIS — J208 Acute bronchitis due to other specified organisms: Secondary | ICD-10-CM | POA: Diagnosis not present

## 2017-02-21 DIAGNOSIS — J4521 Mild intermittent asthma with (acute) exacerbation: Secondary | ICD-10-CM | POA: Diagnosis not present

## 2017-02-21 DIAGNOSIS — R059 Cough, unspecified: Secondary | ICD-10-CM

## 2017-02-21 DIAGNOSIS — R05 Cough: Secondary | ICD-10-CM | POA: Diagnosis not present

## 2017-02-21 LAB — POC INFLUENZA A&B (BINAX/QUICKVUE)
Influenza A, POC: NEGATIVE
Influenza B, POC: NEGATIVE

## 2017-02-21 MED ORDER — DEXAMETHASONE SODIUM PHOSPHATE 10 MG/ML IJ SOLN
10.0000 mg | Freq: Once | INTRAMUSCULAR | Status: AC
  Administered 2017-02-21: 10 mg via INTRAMUSCULAR

## 2017-02-21 MED ORDER — ALBUTEROL SULFATE HFA 108 (90 BASE) MCG/ACT IN AERS: 2.0000 | INHALATION_SPRAY | Freq: Four times a day (QID) | RESPIRATORY_TRACT | 1 refills | Status: DC | PRN

## 2017-02-21 MED ORDER — ALBUTEROL SULFATE (2.5 MG/3ML) 0.083% IN NEBU
2.5000 mg | INHALATION_SOLUTION | Freq: Once | RESPIRATORY_TRACT | Status: AC
  Administered 2017-02-21: 2.5 mg via RESPIRATORY_TRACT

## 2017-02-21 MED ORDER — LEVOFLOXACIN 500 MG PO TABS: 500.0000 mg | ORAL_TABLET | Freq: Every day | ORAL | 0 refills | Status: DC

## 2017-02-21 NOTE — Progress Notes (Signed)
Dr. Karleen Hampshire T. Baylor Cortez, MD, CAQ Sports [facility] [address] [place], [address] Phone: [phone] Fax: [phone]  02/21/2017  Patient: Kristine Curtis, MRN: [id number], DOB: July 08, 1960, 56 y.o.  Primary Physician:  [name], MD   Chief Complaint  Patient presents with  . URI    Cough, dyspnea, chest congestion, wheezing started 3 days ago   Subjective:   Kristine Curtis is a 56 y.o. very pleasant female patient who presents with the following:  Chest, head, ears, back of head and neck. Patient does have a history of asthma and she presents acutely in the office wheezing.  She appears to be in minimal to mild distress.  She is been sick for about 3 days, and has worsened over this time.  With significant cough, shortness of breath, chest congestion.  She denies any fevers.  She is not a smoker.  96% on RA.  Coughing up.   Past Medical History, Surgical History, Social History, Family History, Problem List, Medications, and Allergies have been reviewed and updated if relevant.  Patient Active Problem List   Diagnosis Date Noted  . Failed back syndrome of lumbar spine 12/30/2016    Priority: High  . Chronic systolic congestive heart failure (HCC) 03/22/2015    Priority: High  . Hypothyroidism 08/30/2008    Priority: High  . Primary fibromyalgia syndrome 08/30/2008    Priority: High  . Sicca (HCC) 11/22/2014    Priority: Medium  . Dense breasts 02/03/2017  . ETD (eustachian tube dysfunction) 01/21/2017  . Fatigue 01/21/2017  . Syncope 01/14/2017  . Syncope and collapse   . Chronic low back pain with sciatica 05/02/2015  . Obesity (BMI 30-39.9) 03/22/2015  . Family history of polycystic kidney 11/16/2012  . UNSPECIFIED VITAMIN D DEFICIENCY 10/09/2009  . Other and unspecified hyperlipidemia 11/15/2008  . Major depression in complete remission (HCC) 08/31/2008  . Essential hypertension 08/31/2008  . ALLERGIC  RHINITIS 08/31/2008  . ASTHMA 08/31/2008  . GERD 08/31/2008  . Osteoarthritis 08/31/2008  . Classical migraine without intractable migraine 08/30/2008  . Abnormal Pap smear of cervix 05/07/1995    Past Medical History:  Diagnosis Date  . Allergic rhinitis due to pollen   . Asthma   . Chronic combined systolic and diastolic CHF (congestive heart failure) (HCC)    a. 06/2016 Echo: EF 40-45%, Gr1 DD.  Marland Kitchen Depression   . Family history of polycystic kidney 11/16/2012   neg CT  . Fibromyalgia   . GERD (gastroesophageal reflux disease)   . Hypertension   . Hypothyroidism   . IBS (irritable bowel syndrome)   . Internal hemorrhoids   . Morbid obesity (HCC)   . NICM (nonischemic cardiomyopathy) (HCC)    a. 1999 Cath: nl cors;  b. EF prev as low as 35%;  c. 11/2015 Echo: Ef 40-45%;  d. 06/2016 Echo: EF 40-45%;  e. Lexiscan MV: fixed anterosepta/inferseptal defect w/o ischemia, EF 35%.  . Osteoarthritis   . PVC (premature ventricular contraction)   . Restless leg syndrome   . Sicca (HCC) 11/22/2014  . Vertigo     Past Surgical History:  Procedure Laterality Date  . ABDOMINAL HYSTERECTOMY    . CESAREAN SECTION    . CHOLECYSTECTOMY  10-2008  . COLONOSCOPY    . ESOPHAGOGASTRODUODENOSCOPY    . EYE SURGERY     2 2013 and 1981/DCR of right eye 05/2014  . LUMBAR DISC SURGERY  2016   L3-4  .  RIGHT/LEFT HEART CATH AND CORONARY ANGIOGRAPHY N/A 07/11/2016   Procedure: Right/Left Heart Cath and Coronary Angiography;  Surgeon: Peter M Swaziland, MD;  Location: [facility] INVASIVE CV LAB;  Service: Cardiovascular;  Laterality: N/A;  . SHOULDER ARTHROSCOPY W/ SUBACROMIAL DECOMPRESSION AND DISTAL CLAVICLE EXCISION Right    rotator cuff repair  . TUBAL LIGATION  1992    Social History   Social History  . Marital status: Married    Spouse name: N/A  . Number of children: 2  . Years of education: N/A   Occupational History  . quality assurance tech    Social History Main Topics  . Smoking status: Former  Smoker    Packs/day: 2.00    Years: 12.00    Types: Cigarettes    Quit date: 05/06/1989  . Smokeless tobacco: Never Used     Comment: quit smoking 1990  . Alcohol use No  . Drug use: No  . Sexual activity: Not on file   Other Topics Concern  . Not on file   Social History Narrative   Former Dr. Andrey Campanile then Dr. 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 Pancreatic cancer Mother   . Diabetes Father   . Heart disease Father   . Glaucoma Father   . Kidney failure Brother     Allergies  Allergen Reactions  . Adhesive [Tape]     Redness   . Gabapentin (Once-Daily)     Depression   . Sulfa Antibiotics     itching  . Codeine Itching    Medication list reviewed and updated in full in [facility].  ROS: GEN: Acute illness details above GI: Tolerating PO intake GU: maintaining adequate hydration and urination Pulm: No SOB Interactive and getting along well at home.  Otherwise, ROS is as per the HPI.  Objective:   BP 122/80 (BP Location: Right Arm, Patient Position: Sitting, Cuff Size: Large)   Pulse 94   Temp 98.6 F (37 C) (Oral)   Resp (!) 30   Wt 234 lb (106.1 kg)   SpO2 96%   BMI 41.45 kg/m    GEN: A and O x 3. WDWN. NAD.    ENT: Nose clear, ext NML.  No LAD.  No JVD.  TM's clear. Oropharynx clear.  PULM: Normal WOB, no distress. No crackles, + diffuse exp wheezes, no rhonchi. CV: RRR, no M/G/R, No rubs, No JVD.   EXT: warm and well-perfused, No c/c/e. PSYCH: Pleasant and conversant.    Laboratory and Imaging Data: Results for orders placed or performed in visit on 02/21/17  POC Influenza A&B(BINAX/QUICKVUE)  Result Value Ref Range   Influenza A, POC Negative Negative   Influenza B, POC Negative Negative    Assessment and Plan:   Mild intermittent asthma with acute exacerbation  Cough - Plan: POC Influenza A&B(BINAX/QUICKVUE), albuterol (PROVENTIL) (2.5 MG/3ML) 0.083% nebulizer  solution 2.5 mg, dexamethasone (DECADRON) injection 10 mg  Acute bronchitis due to other specified organisms  I gave her one nebulizer treatment in the office.  She felt better after this treatment and was breathing more comfortably.  Acute asthma exacerbation, Decadron 10 mg IM.  Levaquin 10 day given acute picture.  Follow-up: the patient is transitioning to a new physician, and it appears that she has a follow-up appointment with her on Monday.  Future Appointments Date Time Provider Department Center  02/24/2017 2:00 PM Helane Rima, DO  [facility] None  03/11/2017 9:30 AM Azalee Course, PA [facility]    Meds ordered this encounter  Medications  . albuterol (PROAIR HFA) 108 (90 Base) MCG/ACT inhaler    Sig: Inhale 2 puffs into the lungs every 6 (six) hours as needed for wheezing or shortness of breath.    Dispense:  1 Inhaler    Refill:  1  . albuterol (PROVENTIL) (2.5 MG/3ML) 0.083% nebulizer solution 2.5 mg  . dexamethasone (DECADRON) injection 10 mg  . levofloxacin (LEVAQUIN) 500 MG tablet    Sig: Take 1 tablet (500 mg total) by mouth daily.    Dispense:  10 tablet    Refill:  0   Orders Placed This Encounter  Procedures  . POC Influenza A&B(BINAX/QUICKVUE)    Signed,  Karleen Hampshire T. Alysiana Ethridge, MD   Allergies as of 02/21/2017      Reactions   Adhesive [tape]    Redness    Gabapentin (once-daily)    Depression    Sulfa Antibiotics    itching   Codeine Itching      Medication List       Accurate as of 02/21/17 11:59 PM. Always use your most recent med list.          acetaminophen 500 MG tablet Commonly known as:  TYLENOL Take 1,000 mg by mouth every 4 (four) hours as needed for moderate pain or headache.   albuterol 108 (90 Base) MCG/ACT inhaler Commonly known as:  PROAIR HFA Inhale 2 puffs into the lungs every 6 (six) hours as needed for wheezing or shortness of breath.   BENEFIBER Powd Take 2 scoop by mouth daily as needed (for  constipation).   buPROPion 300 MG 24 hr tablet Commonly known as:  WELLBUTRIN XL Take 1 tablet (300 mg total) by mouth daily.   carvedilol 25 MG tablet Commonly known as:  COREG Take 1/2 tablet ( 12.5 mg ) twice a day   clotrimazole-betamethasone lotion Commonly known as:  LOTRISONE Apply topically 2 (two) times daily.   COLACE PO Take 1 tablet by mouth as needed (CONSTIPATION).   cyclobenzaprine 5 MG tablet Commonly known as:  FLEXERIL Take 5 mg by mouth 2 (two) times daily.   diazepam 5 MG tablet Commonly known as:  VALIUM TAKE ONE (1) TABLET BY MOUTH EVERY SIX HOURS AS NEEDED   EXCEDRIN EXTRA STRENGTH PO Take 2 tablets by mouth 2 (two) times daily as needed (headaches).   furosemide 20 MG tablet Commonly known as:  LASIX TAKE 1 TABLET BY MOUTH DAILY   HYDROcodone-acetaminophen 5-325 MG tablet Commonly known as:  NORCO/VICODIN Take 1 tablet by mouth 3 (three) times daily as needed for moderate pain.   hydroxychloroquine 200 MG tablet Commonly known as:  PLAQUENIL Take 400 mg by mouth every morning.   levofloxacin 500 MG tablet Commonly known as:  LEVAQUIN Take 1 tablet (500 mg total) by mouth daily.   levothyroxine 88 MCG tablet Commonly known as:  SYNTHROID, LEVOTHROID TAKE 1 TABLET BY MOUTH DAILY   losartan 50 MG tablet Commonly known as:  COZAAR Take 50 mg by mouth daily.   meloxicam 7.5 MG tablet Commonly known as:  MOBIC TAKE 1 TABLET BY MOUTH DAILY   omeprazole 40 MG capsule Commonly known as:  PRILOSEC TAKE 1 CAPSULE BY MOUTH BEFORE BREAKFAST   ONE TOUCH ULTRA TEST test strip Generic drug:  glucose blood USE AS DIRECTED TO CHECK BLOOD SUGAR ONCE A DAY   OVER THE COUNTER MEDICATION Take 5 capsules  by mouth daily. Ariix Optimals Vitamin & Minerals   OVER THE COUNTER MEDICATION Take 1 scoop by mouth daily. Ariix Magnecal D   oxyCODONE-acetaminophen 5-325 MG tablet Commonly known as:  PERCOCET/ROXICET Take 1 tablet by mouth daily as needed  for severe pain.   predniSONE 20 MG tablet Commonly known as:  DELTASONE 2 tabs po for 5 days, then 1 tab for 3 days   spironolactone 25 MG tablet Commonly known as:  ALDACTONE Take 1 tablet (25 mg total) by mouth daily.   Vitamin D-3 5000 units Tabs Take 5,000 Units by mouth daily.   XIIDRA 5 % Soln Generic drug:  Lifitegrast Place 1 drop into both eyes daily as needed (dry eyes).

## 2017-02-24 ENCOUNTER — Ambulatory Visit (INDEPENDENT_AMBULATORY_CARE_PROVIDER_SITE_OTHER): Payer: 59

## 2017-02-24 ENCOUNTER — Ambulatory Visit (INDEPENDENT_AMBULATORY_CARE_PROVIDER_SITE_OTHER): Payer: 59 | Admitting: Family Medicine

## 2017-02-24 ENCOUNTER — Encounter: Payer: Self-pay | Admitting: Family Medicine

## 2017-02-24 VITALS — BP 126/82 | HR 97 | Temp 98.0°F | Ht 63.0 in | Wt 229.6 lb

## 2017-02-24 DIAGNOSIS — R05 Cough: Secondary | ICD-10-CM | POA: Diagnosis not present

## 2017-02-24 DIAGNOSIS — H6983 Other specified disorders of Eustachian tube, bilateral: Secondary | ICD-10-CM | POA: Diagnosis not present

## 2017-02-24 DIAGNOSIS — M3500 Sicca syndrome, unspecified: Secondary | ICD-10-CM | POA: Insufficient documentation

## 2017-02-24 DIAGNOSIS — R059 Cough, unspecified: Secondary | ICD-10-CM

## 2017-02-24 DIAGNOSIS — H6993 Unspecified Eustachian tube disorder, bilateral: Secondary | ICD-10-CM

## 2017-02-24 MED ORDER — BENZONATATE 200 MG PO CAPS: 200.0000 mg | ORAL_CAPSULE | Freq: Two times a day (BID) | ORAL | 0 refills | Status: DC | PRN

## 2017-02-24 NOTE — Progress Notes (Signed)
Kristine Curtis is a 56 y.o. female is here to [facility].   Patient Care Team: Helane Rima, DO as PCP - General (Family Medicine) Marcene Corning, MD as Consulting Physician (Orthopedic Surgery) Barnett Abu, MD as Consulting Physician (Neurosurgery) Jethro Bolus, MD as Consulting Physician (Urology) [place], Peter M, MD as Consulting Physician (Cardiology)   History of Present Illness:   Kristine Curtis, CMA, acting as scribe for Dr. Earlene Plater.  HPI:  Patient comes in today to establish care.    Patient has several chronic health conditions including congestive heart failure.  She is wearing an event monitor today.  She states they are evaluating her heart due to blood pressure fluctuations.  She has recently been seen for a cough and congestion.  She continues to be congested today.  She started on Levaquin on Friday.  Occasional wheezing.  She did not have a chest xray.  She was given an albuterol inhaler but states she thought that made things worse.  She felt the inhaler caused a bronchospasm and she was unable to breathe after using it.  She states she was tested for the flu and it was negative.  She has been coughing up light brown sputum.  She states it is very thick in nature, like glue.  She states she thinks her cough is coming from the left side of her chest and not the right.  She is also having pain in her ears.  She states this started after an Theatre [employer].  She states she has spells where she feels like she's going to pass out.  She also has "flashes" and then will vomit.  She states she has been told her ears are full of fluid but are not infected.  She attributes all of this to the flight she took 8 months ago.  She is not aware of anyone else on her flight having the same symptoms.  She has not used a nasal spray for this yet.  She tried a Neti-Pot for several weeks.     There are no preventive care reminders to display for this patient. No flowsheet data  found.   PMHx, SurgHx, SocialHx, Medications, and Allergies were reviewed in the Visit Navigator and updated as appropriate.   Past Medical History:  Diagnosis Date  . Allergic rhinitis due to pollen   . Asthma   . Chronic combined systolic and diastolic CHF (congestive heart failure) (HCC)    06/2016 Echo: EF 40-45%, Gr1 DD  . Depression   . Family history of polycystic kidney with negative CT 11/16/2012  . Fibromyalgia   . GERD (gastroesophageal reflux disease)   . Hypertension   . Hypothyroidism   . IBS (irritable bowel syndrome)   . Internal hemorrhoids   . Morbid obesity (HCC)   . NICM (nonischemic cardiomyopathy) (HCC)    a. 1999 Cath: nl cors;  b. EF prev as low as 35%;  c. 11/2015 Echo: Ef 40-45%;  d. 06/2016 Echo: EF 40-45%;  e. Lexiscan MV: fixed anterosepta/inferseptal defect w/o ischemia, EF 35%.  . Osteoarthritis   . PVC (premature ventricular contraction)   . Restless leg syndrome   . Sicca (HCC) 11/22/2014  . Vertigo    Past Surgical History:  Procedure Laterality Date  . ABDOMINAL HYSTERECTOMY    . CESAREAN SECTION    . CHOLECYSTECTOMY  10-2008  . COLONOSCOPY    . ESOPHAGOGASTRODUODENOSCOPY    . EYE SURGERY     2 2013 and 1981/DCR of right eye  05/2014  . LUMBAR DISC SURGERY  2016   L3-4  . RIGHT/LEFT HEART CATH AND CORONARY ANGIOGRAPHY N/A 07/11/2016   Procedure: Right/Left Heart Cath and Coronary Angiography;  Surgeon: Peter M Swaziland, MD;  Location: [facility] INVASIVE CV LAB;  Service: Cardiovascular;  Laterality: N/A;  . SHOULDER ARTHROSCOPY W/ SUBACROMIAL DECOMPRESSION AND DISTAL CLAVICLE EXCISION Right   . TUBAL LIGATION  1992   Family History  Problem Relation Age of Onset  . Pancreatic cancer Mother   . Diabetes Father   . Heart disease Father   . Glaucoma Father   . Kidney failure Brother    Social History  Substance Use Topics  . Smoking status: Former Smoker    Packs/day: 2.00    Years: 12.00    Types: Cigarettes    Quit date: 05/06/1989  . Smokeless  tobacco: Never Used     Comment: quit smoking 1990  . Alcohol use No   Current Medications and Allergies:   .  acetaminophen (TYLENOL) 500 MG tablet, Take 1,000 mg by mouth every 4 (four) hours as needed for moderate pain or headache., Disp: , Rfl:  .  Aspirin-Acetaminophen-Caffeine (EXCEDRIN EXTRA STRENGTH PO), Take 2 tablets by mouth 2 (two) times daily as needed (headaches)., Disp: , Rfl:  .  buPROPion (WELLBUTRIN XL) 300 MG 24 hr tablet, Take 1 tablet (300 mg total) by mouth daily., Disp: 30 tablet, Rfl: 11 .  carvedilol (COREG) 25 MG tablet, Take 1/2 tablet ( 12.5 mg ) twice a day, Disp: 60 tablet, Rfl: 6 .  Cholecalciferol (VITAMIN D-3) 5000 UNITS TABS, Take 5,000 Units by mouth daily. , Disp: , Rfl:  .  clotrimazole-betamethasone (LOTRISONE) lotion, Apply topically 2 (two) times daily., Disp: 30 mL, Rfl: 0 .  cyclobenzaprine (FLEXERIL) 5 MG tablet, Take 5 mg by mouth 2 (two) times daily. , Disp: , Rfl:  .  diazepam (VALIUM) 5 MG tablet, TAKE ONE (1) TABLET BY MOUTH EVERY SIX HOURS AS NEEDED, Disp: 30 tablet, Rfl: 5 .  Docusate Sodium (COLACE PO), Take 1 tablet by mouth as needed (CONSTIPATION). , Disp: , Rfl:  .  furosemide (LASIX) 20 MG tablet, TAKE 1 TABLET BY MOUTH DAILY, Disp: 90 tablet, Rfl: 0 .  HYDROcodone-acetaminophen (NORCO/VICODIN) 5-325 MG tablet, Take 1 tablet by mouth 3 (three) times daily as needed for moderate pain., Disp: 60 tablet, Rfl: 0 .  hydroxychloroquine (PLAQUENIL) 200 MG tablet, Take 400 mg by mouth every morning. , Disp: , Rfl:  .  levofloxacin (LEVAQUIN) 500 MG tablet, Take 1 tablet (500 mg total) by mouth daily., Disp: 10 tablet, Rfl: 0 .  levothyroxine (SYNTHROID, LEVOTHROID) 88 MCG tablet, TAKE 1 TABLET BY MOUTH DAILY, Disp: 30 tablet, Rfl: 2 .  losartan (COZAAR) 50 MG tablet, Take 50 mg by mouth daily., Disp: , Rfl:  .  meloxicam (MOBIC) 7.5 MG tablet, TAKE 1 TABLET BY MOUTH DAILY, Disp: 30 tablet, Rfl: 3 .  omeprazole (PRILOSEC) 40 MG capsule, TAKE 1  CAPSULE BY MOUTH BEFORE BREAKFAST, Disp: 30 capsule, Rfl: 1 .  ONE TOUCH ULTRA TEST test strip, USE AS DIRECTED TO CHECK BLOOD SUGAR ONCE A DAY, Disp: 100 each, Rfl: 3 .  OVER THE COUNTER MEDICATION, Take 5 capsules by mouth daily. Ariix Optimals Vitamin & Minerals , Disp: , Rfl:  .  OVER THE COUNTER MEDICATION, Take 1 scoop by mouth daily. Ariix Magnecal D , Disp: , Rfl:  .  oxyCODONE-acetaminophen (PERCOCET/ROXICET) 5-325 MG tablet, Take 1 tablet by mouth daily as needed  for severe pain. , Disp: , Rfl:  .  predniSONE (DELTASONE) 20 MG tablet, 2 tabs po for 5 days, then 1 tab for 3 days, Disp: 13 tablet, Rfl: 0 .  spironolactone (ALDACTONE) 25 MG tablet, Take 1 tablet (25 mg total) by mouth daily., Disp: 90 tablet, Rfl: 3 .  Wheat Dextrin (BENEFIBER) POWD, Take 2 scoop by mouth daily as needed (for constipation). , Disp: , Rfl:  .  XIIDRA 5 % SOLN, Place 1 drop into both eyes daily as needed (dry eyes). , Disp: , Rfl:   Allergies  Allergen Reactions  . Adhesive [Tape]     Redness   . Gabapentin (Once-Daily)     Depression   . Sulfa Antibiotics     itching  . Codeine Itching   Review of Systems:   Pertinent items are noted in the HPI. Otherwise, ROS is negative.  Vitals:   Vitals:   02/24/17 1416  BP: 126/82  Pulse: 97  Temp: 98 F (36.7 C)  TempSrc: Oral  SpO2: 95%  Weight: 229 lb 9.6 oz (104.1 kg)  Height: 5\' 3"  (1.6 m)     Body mass index is 40.67 kg/m.   Physical Exam:   Physical Exam  Constitutional: She appears well-developed and well-nourished. No distress.  HENT:  Head: Normocephalic and atraumatic.  Eyes: Pupils are equal, round, and reactive to light. EOM are normal.  Neck: Normal range of motion. Neck supple.  Cardiovascular: Normal rate, regular rhythm, normal heart sounds and intact distal pulses.   Pulmonary/Chest: Effort normal.  Abdominal: Soft.  Skin: Skin is warm.  Psychiatric: She has a normal mood and affect. Her behavior is normal.  Nursing  note and vitals reviewed.  EXAM: CHEST  2 VIEW  COMPARISON:  01/14/2017.  FINDINGS: Trachea is midline. Heart size normal. Lungs are clear. No pleural fluid.  IMPRESSION: No acute findings.  Assessment and Plan:   Kristine Curtis was seen today for establish care.  Diagnoses and all orders for this visit:  Cough Comments: CXR clear. Already on Levaquin. Did not tolerate Albuterol. Will trial Asthmanex (sample provided).  Orders: -     DG Chest 2 View; Future -     benzonatate (TESSALON) 200 MG capsule; Take 1 capsule (200 mg total) by mouth 2 (two) times daily as needed for cough.  Morbid obesity (HCC) Comments: The patient is asked to make an attempt to improve diet and exercise patterns to aid in medical management of this problem.   Dysfunction of both eustachian tubes Comments: Long history per patient. With intermittent dizziness, will ask ENT to evaluate. Orders: -     Ambulatory referral to ENT   . Reviewed expectations re: course of current medical issues. . Discussed self-management of symptoms. . Outlined signs and symptoms indicating need for more acute intervention. . Patient verbalized understanding and all questions were answered. Marland Kitchen. Health Maintenance issues including appropriate healthy diet, exercise, and smoking avoidance were discussed with patient. . See orders for this visit as documented in the electronic medical record. . Patient received an After Visit Summary.  CMA served as Neurosurgeonscribe during this visit. History, Physical, and Plan performed by medical provider. The above documentation has been reviewed and is accurate and complete. Helane RimaErica Alejandro Adcox, D.O.  Helane RimaErica Shirlean Berman, DO [place] 03/01/2017  Future Appointments Date Time Provider Department Center  03/11/2017 9:30 AM Azalee CourseMeng, Hao, PA [facility]

## 2017-02-24 NOTE — Progress Notes (Deleted)
Kristine Curtis is a 56 y.o. female is here to [facility].   Patient Care Team: Helane Rima, DO as PCP - General (Family Medicine) Marcene Corning, MD as Consulting Physician (Orthopedic Surgery) Barnett Abu, MD as Consulting Physician (Neurosurgery) Jethro Bolus, MD as Consulting Physician (Urology) [place], Peter M, MD as Consulting Physician (Cardiology)   History of Present Illness:   HPI:  There are no preventive care reminders to display for this patient. No flowsheet data found.   PMHx, SurgHx, SocialHx, Medications, and Allergies were reviewed in the Visit Navigator and updated as appropriate.   Past Medical History:  Diagnosis Date  . Allergic rhinitis due to pollen   . Asthma   . Chronic combined systolic and diastolic CHF (congestive heart failure) (HCC)    a. 06/2016 Echo: EF 40-45%, Gr1 DD.  Marland Kitchen Depression   . Family history of polycystic kidney 11/16/2012   neg CT  . Fibromyalgia   . GERD (gastroesophageal reflux disease)   . Hypertension   . Hypothyroidism   . IBS (irritable bowel syndrome)   . Internal hemorrhoids   . Morbid obesity (HCC)   . NICM (nonischemic cardiomyopathy) (HCC)    a. 1999 Cath: nl cors;  b. EF prev as low as 35%;  c. 11/2015 Echo: Ef 40-45%;  d. 06/2016 Echo: EF 40-45%;  e. Lexiscan MV: fixed anterosepta/inferseptal defect w/o ischemia, EF 35%.  . Osteoarthritis   . PVC (premature ventricular contraction)   . Restless leg syndrome   . Sicca (HCC) 11/22/2014  . Vertigo    Past Surgical History:  Procedure Laterality Date  . ABDOMINAL HYSTERECTOMY    . CESAREAN SECTION    . CHOLECYSTECTOMY  10-2008  . COLONOSCOPY    . ESOPHAGOGASTRODUODENOSCOPY    . EYE SURGERY     2 2013 and 1981/DCR of right eye 05/2014  . LUMBAR DISC SURGERY  2016   L3-4  . RIGHT/LEFT HEART CATH AND CORONARY ANGIOGRAPHY N/A 07/11/2016   Procedure: Right/Left Heart Cath and Coronary Angiography;  Surgeon: Peter M Swaziland, MD;  Location: [facility] [place] INVASIVE CV  LAB;  Service: Cardiovascular;  Laterality: N/A;  . SHOULDER ARTHROSCOPY W/ SUBACROMIAL DECOMPRESSION AND DISTAL CLAVICLE EXCISION Right    rotator cuff repair  . TUBAL LIGATION  1992   Family History  Problem Relation Age of Onset  . Pancreatic cancer Mother   . Diabetes Father   . Heart disease Father   . Glaucoma Father   . Kidney failure Brother    Social History  Substance Use Topics  . Smoking status: Former Smoker    Packs/day: 2.00    Years: 12.00    Types: Cigarettes    Quit date: 05/06/1989  . Smokeless tobacco: Never Used     Comment: quit smoking 1990  . Alcohol use No   Current Medications and Allergies:   .  acetaminophen (TYLENOL) 500 MG tablet, Take 1,000 mg by mouth every 4 (four) hours as needed for moderate pain or headache., Disp: , Rfl:  .  Aspirin-Acetaminophen-Caffeine (EXCEDRIN EXTRA STRENGTH PO), Take 2 tablets by mouth 2 (two) times daily as needed (headaches)., Disp: , Rfl:  .  buPROPion (WELLBUTRIN XL) 300 MG 24 hr tablet, Take 1 tablet (300 mg total) by mouth daily., Disp: 30 tablet, Rfl: 11 .  carvedilol (COREG) 25 MG tablet, Take 1/2 tablet ( 12.5 mg ) twice a day, Disp: 60 tablet, Rfl: 6 .  Cholecalciferol (VITAMIN D-3) 5000 UNITS TABS, Take 5,000 Units by mouth  daily. , Disp: , Rfl:  .  clotrimazole-betamethasone (LOTRISONE) lotion, Apply topically 2 (two) times daily., Disp: 30 mL, Rfl: 0 .  cyclobenzaprine (FLEXERIL) 5 MG tablet, Take 5 mg by mouth 2 (two) times daily. , Disp: , Rfl:  .  diazepam (VALIUM) 5 MG tablet, TAKE ONE (1) TABLET BY MOUTH EVERY SIX HOURS AS NEEDED, Disp: 30 tablet, Rfl: 5 .  Docusate Sodium (COLACE PO), Take 1 tablet by mouth as needed (CONSTIPATION). , Disp: , Rfl:  .  furosemide (LASIX) 20 MG tablet, TAKE 1 TABLET BY MOUTH DAILY, Disp: 90 tablet, Rfl: 0 .  HYDROcodone-acetaminophen (NORCO/VICODIN) 5-325 MG tablet, Take 1 tablet by mouth 3 (three) times daily as needed for moderate pain., Disp: 60 tablet, Rfl: 0 .   hydroxychloroquine (PLAQUENIL) 200 MG tablet, Take 400 mg by mouth every morning. , Disp: , Rfl:  .  levofloxacin (LEVAQUIN) 500 MG tablet, Take 1 tablet (500 mg total) by mouth daily., Disp: 10 tablet, Rfl: 0 .  levothyroxine (SYNTHROID, LEVOTHROID) 88 MCG tablet, TAKE 1 TABLET BY MOUTH DAILY, Disp: 30 tablet, Rfl: 2 .  losartan (COZAAR) 50 MG tablet, Take 50 mg by mouth daily., Disp: , Rfl:  .  meloxicam (MOBIC) 7.5 MG tablet, TAKE 1 TABLET BY MOUTH DAILY, Disp: 30 tablet, Rfl: 3 .  omeprazole (PRILOSEC) 40 MG capsule, TAKE 1 CAPSULE BY MOUTH BEFORE BREAKFAST, Disp: 30 capsule, Rfl: 1 .  ONE TOUCH ULTRA TEST test strip, USE AS DIRECTED TO CHECK BLOOD SUGAR ONCE A DAY, Disp: 100 each, Rfl: 3 .  OVER THE COUNTER MEDICATION, Take 5 capsules by mouth daily. Ariix Optimals Vitamin & Minerals , Disp: , Rfl:  .  OVER THE COUNTER MEDICATION, Take 1 scoop by mouth daily. Ariix Magnecal D , Disp: , Rfl:  .  oxyCODONE-acetaminophen (PERCOCET/ROXICET) 5-325 MG tablet, Take 1 tablet by mouth daily as needed for severe pain. , Disp: , Rfl:  .  predniSONE (DELTASONE) 20 MG tablet, 2 tabs po for 5 days, then 1 tab for 3 days, Disp: 13 tablet, Rfl: 0 .  spironolactone (ALDACTONE) 25 MG tablet, Take 1 tablet (25 mg total) by mouth daily., Disp: 90 tablet, Rfl: 3 .  Wheat Dextrin (BENEFIBER) POWD, Take 2 scoop by mouth daily as needed (for constipation). , Disp: , Rfl:  .  XIIDRA 5 % SOLN, Place 1 drop into both eyes daily as needed (dry eyes). , Disp: , Rfl:   Facility-Administered Medications Ordered in Other Visits:  .  technetium tetrofosmin (TC-MYOVIEW) injection 29.1 millicurie, 29.1 millicurie, Intravenous, Once PRN, Delton SeeNelson, Faustino CongressKatarina H, MD   Allergies  Allergen Reactions  . Adhesive [Tape]     Redness   . Gabapentin (Once-Daily)     Depression   . Sulfa Antibiotics     itching  . Codeine Itching   Review of Systems:   Pertinent items are noted in the HPI. Otherwise, ROS is negative.  Vitals:     Vitals:   02/24/17 1416  BP: 126/82  Pulse: 97  Temp: 98 F (36.7 C)  TempSrc: Oral  SpO2: 95%  Weight: 229 lb 9.6 oz (104.1 kg)  Height: 5\' 3"  (1.6 m)     Body mass index is 40.67 kg/m.   Physical Exam:   Physical Exam  Constitutional: She appears well-nourished.  HENT:  Head: Normocephalic and atraumatic.  Eyes: Pupils are equal, round, and reactive to light. EOM are normal.  Neck: Normal range of motion. Neck supple.  Cardiovascular: Normal rate, regular  rhythm, normal heart sounds and intact distal pulses.   Pulmonary/Chest: Effort normal.  Abdominal: Soft.  Skin: Skin is warm.  Psychiatric: She has a normal mood and affect. Her behavior is normal.  Nursing note and vitals reviewed.  Results for orders placed or performed in visit on 02/21/17  POC Influenza A&B(BINAX/QUICKVUE)  Result Value Ref Range   Influenza A, POC Negative Negative   Influenza B, POC Negative Negative   Assessment and Plan:   Triana was seen today for establish care.  Diagnoses and all orders for this visit:  Morbid obesity (HCC)  Primary fibromyalgia syndrome  Chronic systolic congestive heart failure (HCC)  Sjogren's syndrome, with unspecified organ involvement (HCC)    . Reviewed expectations re: course of current medical issues. . Discussed self-management of symptoms. . Outlined signs and symptoms indicating need for more acute intervention. . Patient verbalized understanding and all questions were answered. Marland Kitchen Health Maintenance issues including appropriate healthy diet, exercise, and smoking avoidance were discussed with patient. . See orders for this visit as documented in the electronic medical record. . Patient received an After Visit Summary.   Helane Rima, DO [place] 02/24/2017  Future Appointments Date Time Provider Department Center  03/11/2017 9:30 AM Azalee Course, PA [facility]

## 2017-02-24 NOTE — Patient Instructions (Signed)
Medication Samples have been provided to the patient.  Drug name: Asmanex HFA       Strength:        Qty: 1  LOT: [id number]  Exp.Date: 2018 Dec 07  Dosing instructions: 1 puff by mouth twice a day  The patient has been instructed regarding the correct time, dose, and frequency of taking this medication, including desired effects and most common side effects.   Angelina Sheriff Southern Hizer 3:30 PM 02/24/2017

## 2017-02-26 NOTE — Progress Notes (Signed)
Addendum: The patient was contacted 02/25/17 for symptom check in. She endorsed improvement in symptoms with Asmanex inhaler. She was instructed to continue for the month. Helane Rima

## 2017-03-01 ENCOUNTER — Encounter: Payer: Self-pay | Admitting: Family Medicine

## 2017-03-01 DIAGNOSIS — R42 Dizziness and giddiness: Secondary | ICD-10-CM | POA: Insufficient documentation

## 2017-03-04 ENCOUNTER — Ambulatory Visit: Payer: 59 | Admitting: Physician Assistant

## 2017-03-05 ENCOUNTER — Telehealth: Payer: Self-pay | Admitting: Cardiology

## 2017-03-05 NOTE — Telephone Encounter (Signed)
Returned call to patient monitor results given. 

## 2017-03-05 NOTE — Telephone Encounter (Signed)
Pt returned this office call   Please give her a call back with results.

## 2017-03-10 ENCOUNTER — Other Ambulatory Visit: Payer: Self-pay | Admitting: Cardiology

## 2017-03-11 ENCOUNTER — Encounter: Payer: Self-pay | Admitting: Physician Assistant

## 2017-03-11 ENCOUNTER — Ambulatory Visit (INDEPENDENT_AMBULATORY_CARE_PROVIDER_SITE_OTHER): Payer: 59 | Admitting: Physician Assistant

## 2017-03-11 VITALS — BP 110/80 | HR 82 | Ht 63.0 in | Wt 231.0 lb

## 2017-03-11 DIAGNOSIS — I10 Essential (primary) hypertension: Secondary | ICD-10-CM | POA: Diagnosis not present

## 2017-03-11 DIAGNOSIS — R42 Dizziness and giddiness: Secondary | ICD-10-CM

## 2017-03-11 DIAGNOSIS — E039 Hypothyroidism, unspecified: Secondary | ICD-10-CM | POA: Diagnosis not present

## 2017-03-11 DIAGNOSIS — I428 Other cardiomyopathies: Secondary | ICD-10-CM

## 2017-03-11 NOTE — Progress Notes (Signed)
Cardiology Office Note    Date:  03/13/2017   ID:  ZOII FLORER, DOB Dec 30, 1960, MRN [id number]  PCP:  Helane Rima, DO  Cardiologist:  Dr. Swaziland   Chief Complaint  Patient presents with  . Follow-up    followup on dizziness after 30 day monitor    History of Present Illness:  Kristine Curtis is a 56 y.o. female with PMH of NICM, PVCs, HTN, fibromyalgia, morbid obesity, hypothyroidism, RLS and vertigo. She had a cardiac catheterization in 1999 that showed normal coronaries. EF previously as low as 35%, EF improved to 40-45% by 2017. Cardiac MRI in 2017 showed EF 44% with global hypokinesis, no scar or infiltration. She was admitted in February 2018 with 24 hour history of chest pain, dyspnea and headache. Cardiac enzyme were negative. Limited echocardiogram showed EF 40-45% without pericardial effusion. Stress test showed fixed anteroseptal and inferoseptal defect without ischemia. EF was measured 35%. Despite Myoview showing no ischemia, she continued to have exertional chest discomfort and subsequently underwent a left and right heart cath 07/11/2016 which showed normal coronaries. She also had normal cardiac output and EF 35-40%. She had a sleep study that showed in the past no obstructive sleep apnea. She does have restless leg syndrome.  Patient was admitted in September with episode of syncope and another episode of presyncope. She denies any seizure-like activity. Serial troponin was negative. She did not appear to be volume overloaded. Outpatient 30 day event monitor was recommended. I last saw the patient on 02/13/2014, due to cough, she was placed on a short course of steroid therapy. She woke up feeling her heart rate pounding. We also checked orthostatic vital signs, her heart rate did increase with changing body positions, however her blood pressure did not change significantly.  He presents today for cardiology office visit. Her recent 30 day event monitor was negative  for significant arrhythmia. She continued to have clear thick productive cough, however this has significantly improved after a course of steroids and Levaquin recently. She had has also been seen by ENT who opened up her eustachian tube. She says afterward her dizziness has significantly improved. Her blood pressure is very well controlled. She does not have any lower extremity edema, orthopnea or PND. On physical exam, she appears to be euvolemic without any sign of volume overload. I do not think her recent productive cough is related to heart failure. She can follow-up in 6 months. Overall she is stable from cardiology perspective.   Past Medical History:  Diagnosis Date  . Allergic rhinitis due to pollen   . Asthma   . Chronic combined systolic and diastolic CHF (congestive heart failure) (HCC)    06/2016 Echo: EF 40-45%, Gr1 DD  . Depression   . Family history of polycystic kidney with negative CT 11/16/2012  . Fibromyalgia   . GERD (gastroesophageal reflux disease)   . Hypertension   . Hypothyroidism   . IBS (irritable bowel syndrome)   . Internal hemorrhoids   . Morbid obesity (HCC)   . NICM (nonischemic cardiomyopathy) (HCC)    a. 1999 Cath: nl cors;  b. EF prev as low as 35%;  c. 11/2015 Echo: Ef 40-45%;  d. 06/2016 Echo: EF 40-45%;  e. Lexiscan MV: fixed anterosepta/inferseptal defect w/o ischemia, EF 35%.  . Osteoarthritis   . PVC (premature ventricular contraction)   . Restless leg syndrome   . Sicca (HCC) 11/22/2014  . Vertigo     Past Surgical History:  Procedure Laterality  Date  . ABDOMINAL HYSTERECTOMY    . CESAREAN SECTION    . CHOLECYSTECTOMY  10-2008  . COLONOSCOPY    . ESOPHAGOGASTRODUODENOSCOPY    . EYE SURGERY     2 2013 and 1981/DCR of right eye 05/2014  . LUMBAR DISC SURGERY  2016   L3-4  . SHOULDER ARTHROSCOPY W/ SUBACROMIAL DECOMPRESSION AND DISTAL CLAVICLE EXCISION Right   . TUBAL LIGATION  1992    Current Medications: Outpatient Medications Prior to  Visit  Medication Sig Dispense Refill  . acetaminophen (TYLENOL) 500 MG tablet Take 1,000 mg by mouth every 4 (four) hours as needed for moderate pain or headache.    . Aspirin-Acetaminophen-Caffeine (EXCEDRIN EXTRA STRENGTH PO) Take 2 tablets by mouth 2 (two) times daily as needed (headaches).    . benzonatate (TESSALON) 200 MG capsule Take 1 capsule (200 mg total) by mouth 2 (two) times daily as needed for cough. 20 capsule 0  . buPROPion (WELLBUTRIN XL) 300 MG 24 hr tablet Take 1 tablet (300 mg total) by mouth daily. 30 tablet 11  . carvedilol (COREG) 25 MG tablet Take 1/2 tablet ( 12.5 mg ) twice a day 60 tablet 6  . Cholecalciferol (VITAMIN D-3) 5000 UNITS TABS Take 5,000 Units by mouth daily.     . cyclobenzaprine (FLEXERIL) 5 MG tablet Take 5 mg by mouth 2 (two) times daily.     . diazepam (VALIUM) 5 MG tablet TAKE ONE (1) TABLET BY MOUTH EVERY SIX HOURS AS NEEDED 30 tablet 5  . Docusate Sodium (COLACE PO) Take 1 tablet by mouth as needed (CONSTIPATION).     . fluticasone (FLONASE) 50 MCG/ACT nasal spray Place 1 spray daily into both nostrils.    . furosemide (LASIX) 20 MG tablet TAKE 1 TABLET BY MOUTH DAILY 90 tablet 0  . HYDROcodone-acetaminophen (NORCO/VICODIN) 5-325 MG tablet Take 1 tablet by mouth 3 (three) times daily as needed for moderate pain. 60 tablet 0  . hydroxychloroquine (PLAQUENIL) 200 MG tablet Take 400 mg by mouth every morning.     Marland Kitchen levothyroxine (SYNTHROID, LEVOTHROID) 88 MCG tablet TAKE 1 TABLET BY MOUTH DAILY 30 tablet 2  . losartan (COZAAR) 50 MG tablet Take 50 mg by mouth daily.    . meloxicam (MOBIC) 7.5 MG tablet TAKE 1 TABLET BY MOUTH DAILY 30 tablet 3  . mometasone (ASMANEX) 220 MCG/INH inhaler Inhale 2 puffs daily into the lungs.    Marland Kitchen omeprazole (PRILOSEC) 40 MG capsule TAKE 1 CAPSULE BY MOUTH BEFORE BREAKFAST 30 capsule 1  . ONE TOUCH ULTRA TEST test strip USE AS DIRECTED TO CHECK BLOOD SUGAR ONCE A DAY 100 each 3  . OVER THE COUNTER MEDICATION Take 5  capsules by mouth daily. Ariix Optimals Vitamin & Minerals     . OVER THE COUNTER MEDICATION Take 1 scoop by mouth daily. Ariix Magnecal D     . oxyCODONE-acetaminophen (PERCOCET/ROXICET) 5-325 MG tablet Take 1 tablet by mouth daily as needed for severe pain.     . predniSONE (DELTASONE) 20 MG tablet 2 tabs po for 5 days, then 1 tab for 3 days 13 tablet 0  . spironolactone (ALDACTONE) 25 MG tablet Take 1 tablet (25 mg total) by mouth daily. 90 tablet 3  . Wheat Dextrin (BENEFIBER) POWD Take 2 scoop by mouth daily as needed (for constipation).     Marland Kitchen XIIDRA 5 % SOLN Place 1 drop into both eyes daily as needed (dry eyes).     Marland Kitchen losartan (COZAAR) 100 MG tablet  TAKE 1 TABLET BY MOUTH DAILY (Patient not taking: Reported on 03/11/2017) 30 tablet 3  . clotrimazole-betamethasone (LOTRISONE) lotion Apply topically 2 (two) times daily. (Patient not taking: Reported on 03/11/2017) 30 mL 0  . levofloxacin (LEVAQUIN) 500 MG tablet Take 1 tablet (500 mg total) by mouth daily. (Patient not taking: Reported on 03/11/2017) 10 tablet 0   Facility-Administered Medications Prior to Visit  Medication Dose Route Frequency Provider Last Rate Last Dose  . technetium tetrofosmin (TC-MYOVIEW) injection 29.1 millicurie  29.1 millicurie Intravenous Once PRN Lars Masson, MD         Allergies:   Adhesive [tape]; Gabapentin (once-daily); Sulfa antibiotics; and Codeine   Social History   Socioeconomic History  . Marital status: Married    Spouse name: None  . Number of children: 2  . Years of education: None  . Highest education level: None  Social Needs  . Financial resource strain: None  . Food insecurity - worry: None  . Food insecurity - inability: None  . Transportation needs - medical: None  . Transportation needs - non-medical: None  Occupational History  . Occupation: Nutritional therapist  Tobacco Use  . Smoking status: Former Smoker    Packs/day: 2.00    Years: 12.00    Pack years: 24.00     Types: Cigarettes    Last attempt to quit: 05/06/1989    Years since quitting: 27.8  . Smokeless tobacco: Never Used  . Tobacco comment: quit smoking 1990  Substance and Sexual Activity  . Alcohol use: No    Alcohol/week: 0.0 oz  . Drug use: No  . Sexual activity: None  Other Topics Concern  . None  Social History Narrative   Former Dr. Andrey Campanile then Dr. 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   ROS:   Please see the history of present illness.    ROS All other systems reviewed and are negative.   PHYSICAL EXAM:   VS:  BP 110/80   Pulse 82   Ht 5\' 3"  (1.6 m)   Wt 231 lb (104.8 kg)   BMI 40.92 kg/m    GEN: Well nourished, well developed, in no acute distress  HEENT: normal  Neck: no JVD, carotid bruits, or masses Cardiac: RRR; no murmurs, rubs, or gallops,no edema  Respiratory:  clear to auscultation bilaterally, normal work of breathing GI: soft, nontender, nondistended, + BS MS: no deformity or atrophy  Skin: warm and dry, no rash Neuro:  Alert and Oriented x 3, Strength and sensation are intact Psych: euthymic mood, full affect  Wt Readings from Last 3 Encounters:  03/11/17 231 lb (104.8 kg)  02/24/17 229 lb 9.6 oz (104.1 kg)  02/21/17 234 lb (106.1 kg)      Studies/Labs Reviewed:   EKG:  EKG is not ordered today.    Recent Labs: 07/10/2016: BNP 51.4 01/14/2017: TSH 3.036 01/15/2017: ALT 28; BUN 16; Creatinine, Ser 0.98; Hemoglobin 12.2; Magnesium 1.8; Platelets 222; Potassium 3.6; Sodium 139   Lipid Panel    Component Value Date/Time   CHOL 165 03/20/2016 0836   TRIG (H) 03/20/2016 0836    419.0 Triglyceride is over 400; calculations on Lipids are invalid.   HDL 37.20 (L) 03/20/2016 0836   CHOLHDL 4 03/20/2016 0836   VLDL  18.6 02/02/2015 0901    LDLCALC 85 02/02/2015 0901   LDLDIRECT 75.0 03/20/2016 0836    Additional studies/ records that were reviewed today include:   Cath 07/11/2016 Conclusion     There is moderate left ventricular systolic dysfunction.  The left ventricular ejection fraction is 35-45% by visual estimate.  LV end diastolic pressure is mildly elevated.  1. Normal coronary anatomy 2. Moderate LV dysfunction- global. EF 35-40%.  3. Mildly elevated LVEDP 4. Normal pulmonary pressures. RA pressure is elevated. 5. Normal cardiac output.  Plan: continue medical therapy. Consider addition of aldactone to current therapy      ASSESSMENT:    1. Dizziness   2. NICM (nonischemic cardiomyopathy) (HCC)   3. Essential hypertension   4. Hypothyroidism, unspecified type      PLAN:  In order of problems listed above:  1. Dizziness: Orthostatic vital sign negative. Recent event monitor was negative for significant arrhythmia.  2. Nonischemic cardiomyopathy: On carvedilol, losartan and Lasix. Blood pressure stable.  3. Hypertension: Blood pressure well controlled on current medication.  4. Hypothyroidism: TSH was normal in September.    Medication Adjustments/Labs and Tests Ordered: Current medicines are reviewed at length with the patient today.  Concerns regarding medicines are outlined above.  Medication changes, Labs and Tests ordered today are listed in the Patient Instructions below. Patient Instructions  Medication Instructions:  Your physician recommends that you continue on your current medications as directed. Please refer to the Current Medication list given to you today.  Labwork: NONE   Testing/Procedures: NONE   Follow-Up: Your physician wants you to follow-up in: 6 MONTHS WITH DR SwazilandJORDAN. You will receive a reminder letter in the mail two months in advance. If you don't receive a letter, please call our office to schedule the follow-up appointment.  Any Other Special  Instructions Will Be Listed Below (If Applicable).     If you need a refill on your cardiac medications before your next appointment, please call your pharmacy.     Ramond DialSigned, Dia Donate, [place]  03/13/2017 7:07 AM    [facility] [address], [place], [place]  [address] Phone: [phone]; Fax: [phone]

## 2017-03-11 NOTE — Patient Instructions (Signed)
Medication Instructions:  Your physician recommends that you continue on your current medications as directed. Please refer to the Current Medication list given to you today.  Labwork: NONE   Testing/Procedures: NONE   Follow-Up: Your physician wants you to follow-up in: 6 MONTHS WITH DR Swazil[place]. You will receive a reminder letter in the mail two months in advance. If you don't receive a letter, please call our office to schedule the follow-up appointment.  Any Other Special Instructions Will Be Listed Below (If Applicable).     If you need a refill on your cardiac medications before your next appointment, please call your pharmacy.

## 2017-03-13 ENCOUNTER — Encounter: Payer: Self-pay | Admitting: Physician Assistant

## 2017-03-13 ENCOUNTER — Telehealth: Payer: Self-pay | Admitting: Family Medicine

## 2017-03-13 NOTE — Telephone Encounter (Signed)
MEDICATION:  HYDROcodone-acetaminophen (NORCO/VICODIN) 5-325 MG tablet diazepam (VALIUM) 5 MG tablet  PHARMACY:   [facility] - [place], [place] - [address] [phone] (Phone) [phone] (Fax)    IS THIS A 90 DAY SUPPLY : N/A  IS PATIENT OUT OF MEDICATION: Y for diazepam  IF NOT; HOW MUCH IS LEFT: 5 of hydrocodone left  LAST APPOINTMENT DATE: @10 /22/2018  NEXT APPOINTMENT DATE:@Visit  date not found  OTHER COMMENTS: Patient stated these have been being filled by Dr. Patsy Lager.    **Let patient know to contact pharmacy at the end of the day to make sure medication is ready. **  ** Please notify patient to allow 48-72 hours to process**  **Encourage patient to contact the pharmacy for refills or they can request refills through [facility]**

## 2017-03-17 NOTE — Telephone Encounter (Signed)
Please help me to figure out/document frequency of medications and indications. I don't want her to run out if taking regularly. I do want to see her again for a more thorough visit since we discussed more acute issues at the first visit.

## 2017-03-17 NOTE — Telephone Encounter (Signed)
Please advise on refill. Does patient need to come in to be seen?

## 2017-03-18 NOTE — Telephone Encounter (Signed)
[name] reference to notes below. Patient stated she takes medications as needed. Patient stated sometimes 60 of the hydrocodone will last 3 months. Patient stated she takes the diazepam only when having the nerve pain. Patient stated she is having neck surgery on 03/24/17 and will need something for the pain. Scheduled patient for Thursday 03/20/17 to discuss medications.

## 2017-03-18 NOTE — Telephone Encounter (Signed)
Patient coming 03/23/17 for appointment.

## 2017-03-20 ENCOUNTER — Ambulatory Visit: Payer: 59 | Admitting: Family Medicine

## 2017-03-21 ENCOUNTER — Encounter: Payer: Self-pay | Admitting: Family Medicine

## 2017-03-21 ENCOUNTER — Ambulatory Visit (INDEPENDENT_AMBULATORY_CARE_PROVIDER_SITE_OTHER): Payer: 59 | Admitting: Family Medicine

## 2017-03-21 VITALS — BP 112/76 | HR 98 | Temp 98.4°F | Ht 63.0 in | Wt 233.0 lb

## 2017-03-21 DIAGNOSIS — H6993 Unspecified Eustachian tube disorder, bilateral: Secondary | ICD-10-CM

## 2017-03-21 DIAGNOSIS — G8929 Other chronic pain: Secondary | ICD-10-CM

## 2017-03-21 DIAGNOSIS — H6983 Other specified disorders of Eustachian tube, bilateral: Secondary | ICD-10-CM

## 2017-03-21 DIAGNOSIS — M5441 Lumbago with sciatica, right side: Secondary | ICD-10-CM | POA: Diagnosis not present

## 2017-03-21 DIAGNOSIS — M5412 Radiculopathy, cervical region: Secondary | ICD-10-CM

## 2017-03-21 MED ORDER — OXYCODONE-ACETAMINOPHEN 5-325 MG PO TABS: 1.0000 | ORAL_TABLET | Freq: Three times a day (TID) | ORAL | 0 refills | Status: DC | PRN

## 2017-03-21 MED ORDER — DIAZEPAM 5 MG PO TABS: ORAL_TABLET | ORAL | 3 refills | Status: DC

## 2017-03-21 MED ORDER — FLUTICASONE PROPIONATE 50 MCG/ACT NA SUSP: 1.0000 | Freq: Every day | NASAL | 3 refills | Status: DC

## 2017-03-23 ENCOUNTER — Encounter: Payer: Self-pay | Admitting: Family Medicine

## 2017-03-23 NOTE — Progress Notes (Signed)
Kristine Curtis is a 56 y.o. female is here for follow up.  History of Present Illness:   HPI: See Assessment and Plan section for Problem Based Charting of issues discussed today.  PMHx, SurgHx, SocialHx, FamHx, Medications, and Allergies were reviewed in the Visit Navigator and updated as appropriate.   Patient Active Problem List   Diagnosis Date Noted  . Dizziness 03/01/2017  . Morbid obesity (HCC) 02/24/2017  . Sjogren's disease (HCC) 02/24/2017  . Dense breasts 02/03/2017  . ETD (eustachian tube dysfunction) 01/21/2017  . Fatigue 01/21/2017  . Syncope and collapse   . Failed back syndrome of lumbar spine 12/30/2016  . Chronic low back pain with sciatica 05/02/2015  . Chronic systolic congestive heart failure (HCC) 03/22/2015  . Sicca (HCC) 11/22/2014  . Family history of polycystic kidney 11/16/2012  . Vitamin D deficiency 10/09/2009  . Other and unspecified hyperlipidemia 11/15/2008  . Major depression in complete remission (HCC) 08/31/2008  . Essential hypertension 08/31/2008  . Allergic rhinitis 08/31/2008  . Asthma 08/31/2008  . GERD 08/31/2008  . Osteoarthritis 08/31/2008  . Hypothyroidism 08/30/2008  . Classical migraine without intractable migraine 08/30/2008  . Primary fibromyalgia syndrome 08/30/2008  . Abnormal Pap smear of cervix 05/07/1995   Social History   Tobacco Use  . Smoking status: Former Smoker    Packs/day: 2.00    Years: 12.00    Pack years: 24.00    Types: Cigarettes    Last attempt to quit: 05/06/1989    Years since quitting: 27.8  . Smokeless tobacco: Never Used  . Tobacco comment: quit smoking 1990  Substance Use Topics  . Alcohol use: No    Alcohol/week: 0.0 oz  . Drug use: No   Current Medications and Allergies:   .  buPROPion (WELLBUTRIN XL) 300 MG 24 hr tablet, Take 1 tablet (300 mg total) by mouth daily., Disp: 30 tablet, Rfl: 11 .  carvedilol (COREG) 25 MG tablet, Take 1/2 tablet ( 12.5 mg ) twice a day, Disp: 60 tablet,  Rfl: 6 .  Cholecalciferol (VITAMIN D-3) 5000 UNITS TABS, Take 5,000 Units by mouth daily. , Disp: , Rfl:  .  diazepam (VALIUM) 5 MG tablet, TAKE ONE (1) TABLET BY MOUTH EVERY SIX HOURS AS NEEDED, Disp: 30 tablet, Rfl: 3 .  Docusate Sodium (COLACE PO), Take 1 tablet by mouth as needed (CONSTIPATION). , Disp: , Rfl:  .  fluticasone (FLONASE) 50 MCG/ACT nasal spray, Place 1 spray daily into both nostrils., Disp: 16 g, Rfl: 3 .  furosemide (LASIX) 20 MG tablet, TAKE 1 TABLET BY MOUTH DAILY, Disp: 90 tablet, Rfl: 0 .  hydroxychloroquine (PLAQUENIL) 200 MG tablet, Take 400 mg by mouth every morning. , Disp: , Rfl:  .  levothyroxine (SYNTHROID, LEVOTHROID) 88 MCG tablet, TAKE 1 TABLET BY MOUTH DAILY, Disp: 30 tablet, Rfl: 2 .  losartan (COZAAR) 50 MG tablet, Take 50 mg by mouth daily., Disp: , Rfl:  .  meloxicam (MOBIC) 7.5 MG tablet, TAKE 1 TABLET BY MOUTH DAILY, Disp: 30 tablet, Rfl: 3 .  mometasone (ASMANEX) 220 MCG/INH inhaler, Inhale 2 puffs daily into the lungs., Disp: , Rfl:  .  omeprazole (PRILOSEC) 40 MG capsule, TAKE 1 CAPSULE BY MOUTH BEFORE BREAKFAST, Disp: 30 capsule, Rfl: 1 .  oxyCODONE-acetaminophen (PERCOCET/ROXICET) 5-325 MG tablet, Take 1 tablet by mouth daily as needed for severe pain. , Disp: , Rfl:  .  spironolactone (ALDACTONE) 25 MG tablet, Take 1 tablet (25 mg total) by mouth daily., Disp: 90  tablet, Rfl: 3  Allergies  Allergen Reactions  . Gabapentin (Once-Daily)     Depression   . Sulfa Antibiotics     itching  . Adhesive [Tape] Rash  . Codeine Itching   Review of Systems   Pertinent items are noted in the HPI. Otherwise, ROS is negative.  Vitals:   Vitals:   03/21/17 1515  BP: 112/76  Pulse: 98  Temp: 98.4 F (36.9 C)  TempSrc: Oral  SpO2: 96%  Weight: 233 lb (105.7 kg)  Height: 5\' 3"  (1.6 m)     Body mass index is 41.27 kg/m.   Physical Exam:   Physical Exam  Constitutional: She appears well-nourished.  HENT:  Head: Normocephalic and atraumatic.    Eyes: EOM are normal. Pupils are equal, round, and reactive to light.  Neck: Normal range of motion. Neck supple.  Cardiovascular: Normal rate, regular rhythm, normal heart sounds and intact distal pulses.  Pulmonary/Chest: Effort normal.  Abdominal: Soft.  Skin: Skin is warm.  Psychiatric: She has a normal mood and affect. Her behavior is normal.  Nursing note and vitals reviewed.   Assessment and Plan:   Kristine Curtis was seen today for medication refill.  Diagnoses and all orders for this visit:  Chronic right-sided low back pain with right-sided sciatica Comments: MRI 2016: Disc bulge with a superimposed right lateral recess protrusion at L3-4 impinges on the descending right L4 root and deforms right aspect of the thecal sac. Shallow disc bulge at L4-5 results in narrowing in the lateral recesses which could impact either descending L5 root.  Reviewed pain control and risks versus benefits of below medications. She used them prn and sparingly. No previous issues. Pain Contract completed for me. Database reviewed. Orders: -     oxyCODONE-acetaminophen (PERCOCET/ROXICET) 5-325 MG tablet; Take 1 tablet every 8 (eight) hours as needed by mouth for severe pain. -     diazepam (VALIUM) 5 MG tablet; TAKE ONE (1) TABLET BY MOUTH EVERY SIX HOURS AS NEEDED  Morbid obesity (HCC) Comments: We will focus on this after she recovers from her upcoming surgery.  Dysfunction of both eustachian tubes Comments: Improved after seeing ENT. She was taught how to equalize the pressure and feels that it helps.  Orders: -     fluticasone (FLONASE) 50 MCG/ACT nasal spray; Place 1 spray daily into both nostrils.  Chronic radicular cervical pain Comments: Reviewed pain control as above. She states that she will be having surgery soon for a bulging disc. This causes a nearly daily headache. Will request records from surgeon.  Orders: -     oxyCODONE-acetaminophen (PERCOCET/ROXICET) 5-325 MG tablet; Take 1  tablet every 8 (eight) hours as needed by mouth for severe pain. -     diazepam (VALIUM) 5 MG tablet; TAKE ONE (1) TABLET BY MOUTH EVERY SIX HOURS AS NEEDED   . Reviewed expectations re: course of current medical issues. . Discussed self-management of symptoms. . Outlined signs and symptoms indicating need for more acute intervention. . Patient verbalized understanding and all questions were answered. Marland Kitchen Health Maintenance issues including appropriate healthy diet, exercise, and smoking avoidance were discussed with patient. . See orders for this visit as documented in the electronic medical record. . Patient received an After Visit Summary.  Helane Rima, DO , [place] [facility] 03/23/2017  Future Appointments  Date Time Provider Department Center  06/23/2017  1:00 PM Helane Rima, DO [facility] None

## 2017-04-07 ENCOUNTER — Other Ambulatory Visit: Payer: Self-pay | Admitting: Internal Medicine

## 2017-04-07 ENCOUNTER — Other Ambulatory Visit: Payer: Self-pay | Admitting: Family Medicine

## 2017-04-11 ENCOUNTER — Ambulatory Visit (INDEPENDENT_AMBULATORY_CARE_PROVIDER_SITE_OTHER): Payer: 59

## 2017-04-11 ENCOUNTER — Ambulatory Visit (INDEPENDENT_AMBULATORY_CARE_PROVIDER_SITE_OTHER): Payer: 59 | Admitting: Family Medicine

## 2017-04-11 ENCOUNTER — Encounter: Payer: Self-pay | Admitting: Family Medicine

## 2017-04-11 VITALS — BP 136/80 | HR 100 | Temp 98.7°F | Wt 233.2 lb

## 2017-04-11 DIAGNOSIS — M25562 Pain in left knee: Secondary | ICD-10-CM

## 2017-04-11 DIAGNOSIS — F325 Major depressive disorder, single episode, in full remission: Secondary | ICD-10-CM

## 2017-04-11 MED ORDER — LIRAGLUTIDE -WEIGHT MANAGEMENT 18 MG/3ML ~~LOC~~ SOPN: 0.6000 mg | PEN_INJECTOR | Freq: Every day | SUBCUTANEOUS | 0 refills | Status: DC

## 2017-04-11 NOTE — Progress Notes (Addendum)
Kristine Curtis is a 56 y.o. female is here for follow up.  History of Present Illness:   HPI: See Assessment and Plan section for Problem Based Charting of issues discussed today.  PMHx, SurgHx, SocialHx, FamHx, Medications, and Allergies were reviewed in the Visit Navigator and updated as appropriate.   Patient Active Problem List   Diagnosis Date Noted  . Dizziness 03/01/2017  . Morbid obesity (HCC) 02/24/2017  . Sjogren's disease (HCC) 02/24/2017  . Dense breasts 02/03/2017  . ETD (eustachian tube dysfunction) 01/21/2017  . Fatigue 01/21/2017  . Syncope and collapse   . Failed back syndrome of lumbar spine 12/30/2016  . Chronic low back pain with sciatica 05/02/2015  . Chronic systolic congestive heart failure (HCC) 03/22/2015  . Sicca (HCC) 11/22/2014  . Family history of polycystic kidney 11/16/2012  . Vitamin D deficiency 10/09/2009  . Other and unspecified hyperlipidemia 11/15/2008  . Major depression in complete remission (HCC) 08/31/2008  . Essential hypertension 08/31/2008  . Allergic rhinitis 08/31/2008  . Asthma 08/31/2008  . GERD 08/31/2008  . Osteoarthritis 08/31/2008  . Hypothyroidism 08/30/2008  . Classical migraine without intractable migraine 08/30/2008  . Primary fibromyalgia syndrome 08/30/2008  . Abnormal Pap smear of cervix 05/07/1995   Social History   Tobacco Use  . Smoking status: Former Smoker    Packs/day: 2.00    Years: 12.00    Pack years: 24.00    Types: Cigarettes    Last attempt to quit: 05/06/1989    Years since quitting: 27.9  . Smokeless tobacco: Never Used  . Tobacco comment: quit smoking 1990  Substance Use Topics  . Alcohol use: No    Alcohol/week: 0.0 oz  . Drug use: No   Current Medications and Allergies:   .  acetaminophen (TYLENOL) 500 MG tablet, Take 1,000 mg by mouth every 4 (four) hours as needed for moderate pain or headache., Disp: , Rfl:  .  Aspirin-Acetaminophen-Caffeine (EXCEDRIN EXTRA STRENGTH PO), Take 2  tablets by mouth 2 (two) times daily as needed (headaches)., Disp: , Rfl:  .  buPROPion (WELLBUTRIN XL) 300 MG 24 hr tablet, TAKE 1 TABLET BY MOUTH DAILY, Disp: 30 tablet, Rfl: 11 .  carvedilol (COREG) 25 MG tablet, Take 1/2 tablet ( 12.5 mg ) twice a day, Disp: 60 tablet, Rfl: 6 .  Cholecalciferol (VITAMIN D-3) 5000 UNITS TABS, Take 5,000 Units by mouth daily. , Disp: , Rfl:  .  diazepam (VALIUM) 5 MG tablet, TAKE ONE (1) TABLET BY MOUTH EVERY SIX HOURS AS NEEDED, Disp: 30 tablet, Rfl: 3 .  fluticasone (FLONASE) 50 MCG/ACT nasal spray, Place 1 spray daily into both nostrils., Disp: 16 g, Rfl: 3 .  furosemide (LASIX) 20 MG tablet, TAKE 1 TABLET BY MOUTH DAILY, Disp: 90 tablet, Rfl: 0 .  hydroxychloroquine (PLAQUENIL) 200 MG tablet, Take 400 mg by mouth every morning. , Disp: , Rfl:  .  levothyroxine (SYNTHROID, LEVOTHROID) 88 MCG tablet, TAKE 1 TABLET BY MOUTH DAILY, Disp: 30 tablet, Rfl: 2 .  losartan (COZAAR) 50 MG tablet, Take 50 mg by mouth daily., Disp: , Rfl:  .  meloxicam (MOBIC) 7.5 MG tablet, TAKE 1 TABLET BY MOUTH DAILY, Disp: 30 tablet, Rfl: 3 .  mometasone (ASMANEX) 220 MCG/INH inhaler, Inhale 2 puffs daily into the lungs., Disp: , Rfl:  .  omeprazole (PRILOSEC) 40 MG capsule, TAKE ONE (1) CAPSULE BY MOUTH  BEFORE BREAKFAST, Disp: 30 capsule, Rfl: 1 .  ONE TOUCH ULTRA TEST test strip, USE AS DIRECTED  TO CHECK BLOOD SUGAR ONCE A DAY, Disp: 100 each, Rfl: 3 .  OVER THE COUNTER MEDICATION, Take 5 capsules by mouth daily. Ariix Optimals Vitamin & Minerals , Disp: , Rfl:  .  OVER THE COUNTER MEDICATION, Take 1 scoop by mouth daily. Ariix Magnecal D , Disp: , Rfl:  .  oxyCODONE-acetaminophen (PERCOCET/ROXICET) 5-325 MG tablet, Take 1 tablet every 8 (eight) hours as needed by mouth for severe pain., Disp: 60 tablet, Rfl: 0 .  spironolactone (ALDACTONE) 25 MG tablet, Take 1 tablet (25 mg total) by mouth daily., Disp: 90 tablet, Rfl: 3 .  Wheat Dextrin (BENEFIBER) POWD, Take 2 scoop by mouth  daily as needed (for constipation). , Disp: , Rfl:  .  XIIDRA 5 % SOLN, Place 1 drop into both eyes daily as needed (dry eyes). , Disp: , Rfl:   Allergies  Allergen Reactions  . Gabapentin (Once-Daily)     Depression   . Sulfa Antibiotics     itching  . Adhesive [Tape] Rash  . Codeine Itching   Review of Systems   Pertinent items are noted in the HPI. Otherwise, ROS is negative.  Vitals:   Vitals:   04/11/17 1400  BP: 136/80  Pulse: 100  Temp: 98.7 F (37.1 C)  TempSrc: Oral  SpO2: 97%  Weight: 233 lb 3.2 oz (105.8 kg)     Body mass index is 41.31 kg/m.   Physical Exam:   Physical Exam  Constitutional: She appears well-nourished.  HENT:  Head: Normocephalic and atraumatic.  Eyes: EOM are normal. Pupils are equal, round, and reactive to light.  Neck: Normal range of motion. Neck supple.  Cardiovascular: Normal rate, regular rhythm, normal heart sounds and intact distal pulses.  Pulmonary/Chest: Effort normal.  Abdominal: Soft.  Musculoskeletal:       Right knee: She exhibits no swelling. Tenderness found. Medial joint line and lateral joint line tenderness noted.       Left knee: She exhibits no effusion. Tenderness found. Medial joint line and lateral joint line tenderness noted.  Skin: Skin is warm.  Psychiatric: She has a normal mood and affect. Her behavior is normal.  Nursing note and vitals reviewed.   Assessment and Plan:   Rinaldo Cloudamela was seen today for knee pain and back pain.  Diagnoses and all orders for this visit:  Acute pain of left knee Comments: Patient complains of left knee pain. The pain began several months ago. The pain is located medial, patellar regions.  She describes the symptoms as aching. Symptoms improve with rest, ice. The symptoms are worse with stair climbing, kneeling, sitting for prolonged periods of time. The knee has not given out or felt unstable. The patient can bend and straighten the knee fully.  The patient is active in none.  Treatment to date has been NSAID's, knee sleeve/brace, without significant relief.  EXAM: LEFT KNEE - COMPLETE 4+ VIEW IMPRESSION: Mild tricompartmental degenerative change.  We reviewed treatment, including PRICE, PT, and red flags.  Orders: -     DG Knee Complete 4 Views Left  Major depression in complete remission (HCC) Comments:  She is doing well. She started a diary, which has been helpful. She is also trying to meditate. She is in a good place to change her dietary and exercise habits.   Morbid obesity (HCC) Comments: Contraindications to weight loss: none. Patient readiness to commit to diet and activity changes: excellent. Barriers to weight loss: chronic pain.  Plan: 1. Diagnostic studies to rule out secondary  causes of obesity: see below. 2. General patient education:   Average sustained weight loss in long-term studies w/lifestyle interventions alone is 10-15 lb.  Importance of long-term maintenance tx in weight loss.  Use non-food self-rewards to reinforce behavior changes.  Elicit support from others; identify saboteurs.  Practical target weight is usually around 2 BMI units below current weight. 3. Diet interventions:   Risks of dieting were reviewed, including fatigue, temporary hair loss, gallstone formation, gout, and with very low calorie diets, electrolyte abnormalities, nutrient inadequacies, and loss of lean body mass. 4. Exercise intervention:   Informal measures, e.g. taking stairs instead of elevator.  Formal exercise regimen options. 5. Other behavioral treatment: stress management. 6. Other treatment: Medication: SAXENDA, SEE AVS. 7. Patient to keep a weight log that we will review at follow up. 8. Follow up: 3 months and as needed.  Orders: -     Liraglutide -Weight Management (SAXENDA) 18 MG/3ML SOPN; Inject 0.6 mg into the skin daily.   . Reviewed expectations re: course of current medical issues. . Discussed self-management of  symptoms. . Outlined signs and symptoms indicating need for more acute intervention. . Patient verbalized understanding and all questions were answered. Marland Kitchen Health Maintenance issues including appropriate healthy diet, exercise, and smoking avoidance were discussed with patient. . See orders for this visit as documented in the electronic medical record. . Patient received an After Visit Summary.  Helane Rima, DO [place] 04/13/2017  Future Appointments  Date Time Provider Department Center  06/23/2017  1:00 PM Helane Rima, DO [facility]

## 2017-04-13 ENCOUNTER — Encounter: Payer: Self-pay | Admitting: Family Medicine

## 2017-04-23 ENCOUNTER — Telehealth: Payer: Self-pay | Admitting: Family Medicine

## 2017-04-23 NOTE — Telephone Encounter (Signed)
Called patient let her know we are working on appointment for PT. Her son had asked her if she would be willing to try CBD products. She told him that she would not even think of taking with out your ok. She wanted to know where you stand on that.

## 2017-04-23 NOTE — Telephone Encounter (Signed)
Hold on the CBD oil for now. I don't recommend.

## 2017-04-23 NOTE — Telephone Encounter (Signed)
Copied from CRM [id number]. Topic: Inquiry >> Apr 23, 2017 12:54 PM Kristine Curtis wrote: Reason for CRM: pt called to have Joellen contact her back to check on status of what they were speaking about thru mychart pt states it about physical therapy, contact pt

## 2017-04-23 NOTE — Telephone Encounter (Signed)
Per patient request sent my chart message with instructions.

## 2017-04-24 NOTE — Telephone Encounter (Signed)
Spoke with Judeth Cornfield at [facility], she advised it would be in the patients best interest to go to [facility] through [facility]. That would be the location on [address] in [place]. It will still be fairly close for the patient as well.

## 2017-04-25 ENCOUNTER — Telehealth: Payer: Self-pay | Admitting: Radiology

## 2017-04-25 MED ORDER — LIRAGLUTIDE -WEIGHT MANAGEMENT 18 MG/3ML ~~LOC~~ SOPN: 3.0000 mg | PEN_INJECTOR | Freq: Every day | SUBCUTANEOUS | 0 refills | Status: DC

## 2017-04-25 NOTE — Telephone Encounter (Signed)
I do not see a referral in the system yet for this, once it is placed I will send to [facility].

## 2017-04-25 NOTE — Telephone Encounter (Signed)
Have sent the prescription to the pharmacy and left message on phone. I also explained on the message that I have sent in for the higher dose, but if she is not up to the dose yet to continue to instructions until she gets to that dose.

## 2017-04-25 NOTE — Addendum Note (Signed)
Addended by: Dorian Pod on: 04/25/2017 10:27 AM   Modules accepted: Orders

## 2017-04-25 NOTE — Telephone Encounter (Signed)
Copied from CRM [id number]. Topic: General - Other >> Apr 25, 2017  8:52 AM Percival Spanish wrote:   Pt call to say she was given a sample of the following med [facility], she call today to say she only have a few left and is asking if a RX will be called in or does the doctor want to give her another sample  PT PHONE NUMBER [phone]  Pharmacy [facility] [place]

## 2017-04-30 ENCOUNTER — Encounter: Payer: Self-pay | Admitting: Family Medicine

## 2017-05-01 ENCOUNTER — Encounter: Payer: Self-pay | Admitting: Family Medicine

## 2017-05-02 NOTE — Telephone Encounter (Signed)
Called patient gave all information she will. D/c and f/u app made for her. She will call if any questions.

## 2017-05-05 ENCOUNTER — Other Ambulatory Visit: Payer: Self-pay | Admitting: Cardiology

## 2017-05-05 NOTE — Telephone Encounter (Signed)
Rx has been sent to the pharmacy electronically. ° °

## 2017-05-08 ENCOUNTER — Telehealth: Payer: Self-pay | Admitting: Family Medicine

## 2017-05-08 DIAGNOSIS — R42 Dizziness and giddiness: Secondary | ICD-10-CM

## 2017-05-08 NOTE — Telephone Encounter (Signed)
Copied from CRM [id number]. Topic: Inquiry >> May 08, 2017 10:11 AM Anice Paganini I, NT wrote: Reason for CRM:Pt call and said the doctor Earlene Plater wanted send her to Physical Therapy she need to know here she need to go,Contact the pt with inf

## 2017-05-08 NOTE — Telephone Encounter (Signed)
Referral placed today. See email from patient (04/13/17) for details.

## 2017-05-08 NOTE — Telephone Encounter (Signed)
Please advise on note below. There is not a referral for Physical Therapy in the chart.

## 2017-05-13 ENCOUNTER — Ambulatory Visit: Payer: 59 | Admitting: Family Medicine

## 2017-05-13 ENCOUNTER — Encounter: Payer: Self-pay | Admitting: Family Medicine

## 2017-05-13 VITALS — BP 124/86 | HR 102 | Temp 98.3°F | Ht 63.0 in | Wt 229.6 lb

## 2017-05-13 DIAGNOSIS — E119 Type 2 diabetes mellitus without complications: Secondary | ICD-10-CM | POA: Diagnosis not present

## 2017-05-13 DIAGNOSIS — R42 Dizziness and giddiness: Secondary | ICD-10-CM | POA: Diagnosis not present

## 2017-05-13 MED ORDER — LIRAGLUTIDE -WEIGHT MANAGEMENT 18 MG/3ML ~~LOC~~ SOPN: 1.8000 mg | PEN_INJECTOR | Freq: Every day | SUBCUTANEOUS | 0 refills | Status: DC

## 2017-05-13 NOTE — Progress Notes (Signed)
Kristine Curtis is a 57 y.o. female is here for follow up.  History of Present Illness:   Britt Bottom CMA acting as scribe for Dr. Earlene Plater.  HPI: See Assessment and Plan section for Problem Based Charting of issues discussed today.  Medication Change: Patient comes in today for change of her medication. She was taking the Saxenda  With success and then started having a reaction to it.   PMHx, SurgHx, SocialHx, FamHx, Medications, and Allergies were reviewed in the Visit Navigator and updated as appropriate.   Patient Active Problem List   Diagnosis Date Noted  . Dizziness 03/01/2017  . Morbid obesity (HCC) 02/24/2017  . Sjogren's disease (HCC) 02/24/2017  . Dense breasts 02/03/2017  . ETD (eustachian tube dysfunction) 01/21/2017  . Fatigue 01/21/2017  . Syncope and collapse   . Failed back syndrome of lumbar spine 12/30/2016  . Chronic low back pain with sciatica 05/02/2015  . Chronic systolic congestive heart failure (HCC) 03/22/2015  . Sicca (HCC) 11/22/2014  . Family history of polycystic kidney 11/16/2012  . Vitamin D deficiency 10/09/2009  . Other and unspecified hyperlipidemia 11/15/2008  . Major depression in complete remission (HCC) 08/31/2008  . Essential hypertension 08/31/2008  . Allergic rhinitis 08/31/2008  . Asthma 08/31/2008  . GERD 08/31/2008  . Osteoarthritis 08/31/2008  . Hypothyroidism 08/30/2008  . Classical migraine without intractable migraine 08/30/2008  . Primary fibromyalgia syndrome 08/30/2008  . Abnormal Pap smear of cervix 05/07/1995   Social History   Tobacco Use  . Smoking status: Former Smoker    Packs/day: 2.00    Years: 12.00    Pack years: 24.00    Types: Cigarettes    Last attempt to quit: 05/06/1989    Years since quitting: 28.0  . Smokeless tobacco: Never Used  . Tobacco comment: quit smoking 1990  Substance Use Topics  . Alcohol use: No    Alcohol/week: 0.0 oz  . Drug use: No   Current Medications and Allergies:   .   acetaminophen (TYLENOL) 500 MG tablet, Take 1,000 mg by mouth every 4 (four) hours as needed for moderate pain or headache., Disp: , Rfl:  .  Aspirin-Acetaminophen-Caffeine (EXCEDRIN EXTRA STRENGTH PO), Take 2 tablets by mouth 2 (two) times daily as needed (headaches)., Disp: , Rfl:  .  buPROPion (WELLBUTRIN XL) 300 MG 24 hr tablet, TAKE 1 TABLET BY MOUTH DAILY, Disp: 30 tablet, Rfl: 11 .  carvedilol (COREG) 25 MG tablet, Take 1/2 tablet ( 12.5 mg ) twice a day, Disp: 60 tablet, Rfl: 6 .  Cholecalciferol (VITAMIN D-3) 5000 UNITS TABS, Take 5,000 Units by mouth daily. , Disp: , Rfl:  .  diazepam (VALIUM) 5 MG tablet, TAKE ONE (1) TABLET BY MOUTH EVERY SIX HOURS AS NEEDED, Disp: 30 tablet, Rfl: 3 .  fluticasone (FLONASE) 50 MCG/ACT nasal spray, Place 1 spray daily into both nostrils., Disp: 16 g, Rfl: 3 .  furosemide (LASIX) 20 MG tablet, TAKE 1 TABLET BY MOUTH DAILY, Disp: 90 tablet, Rfl: 2 .  hydroxychloroquine (PLAQUENIL) 200 MG tablet, Take 400 mg by mouth every morning. , Disp: , Rfl:  .  levothyroxine (SYNTHROID, LEVOTHROID) 88 MCG tablet, TAKE 1 TABLET BY MOUTH DAILY, Disp: 30 tablet, Rfl: 2 .  Liraglutide -Weight Management (SAXENDA) 18 MG/3ML SOPN, Inject 0.6 mg into the skin daily., Disp: 1 pen, Rfl: 0 .  Liraglutide -Weight Management (SAXENDA) 18 MG/3ML SOPN, Inject 3 mg into the skin daily., Disp: 3 mL, Rfl: 0 .  losartan (COZAAR)  50 MG tablet, Take 50 mg by mouth daily., Disp: , Rfl:  .  meloxicam (MOBIC) 7.5 MG tablet, TAKE 1 TABLET BY MOUTH DAILY, Disp: 30 tablet, Rfl: 3 .  mometasone (ASMANEX) 220 MCG/INH inhaler, Inhale 2 puffs daily into the lungs., Disp: , Rfl:  .  omeprazole (PRILOSEC) 40 MG capsule, TAKE ONE (1) CAPSULE BY MOUTH  BEFORE BREAKFAST, Disp: 30 capsule, Rfl: 1 .  ONE TOUCH ULTRA TEST test strip, USE AS DIRECTED TO CHECK BLOOD SUGAR ONCE A DAY, Disp: 100 each, Rfl: 3 .  OVER THE COUNTER MEDICATION, Take 5 capsules by mouth daily. Ariix Optimals Vitamin & Minerals ,  Disp: , Rfl:  .  OVER THE COUNTER MEDICATION, Take 1 scoop by mouth daily. Ariix Magnecal D , Disp: , Rfl:  .  oxyCODONE-acetaminophen (PERCOCET/ROXICET) 5-325 MG tablet, Take 1 tablet every 8 (eight) hours as needed by mouth for severe pain., Disp: 60 tablet, Rfl: 0 .  spironolactone (ALDACTONE) 25 MG tablet, Take 1 tablet (25 mg total) by mouth daily., Disp: 90 tablet, Rfl: 3 .  Wheat Dextrin (BENEFIBER) POWD, Take 2 scoop by mouth daily as needed (for constipation). , Disp: , Rfl:  .  XIIDRA 5 % SOLN, Place 1 drop into both eyes daily as needed (dry eyes). , Disp: , Rfl:   Allergies  Allergen Reactions  . Gabapentin (Once-Daily)     Depression   . Sulfa Antibiotics     itching  . Adhesive [Tape] Rash  . Codeine Itching   Review of Systems   Pertinent items are noted in the HPI. Otherwise, ROS is negative.  Vitals:   Vitals:   05/13/17 1053  BP: 124/86  Pulse: (!) 102  Temp: 98.3 F (36.8 C)  TempSrc: Oral  SpO2: 99%  Weight: 229 lb 9.6 oz (104.1 kg)  Height: 5\' 3"  (1.6 m)     Body mass index is 40.67 kg/m.  Physical Exam:   Physical Exam  Constitutional: She appears well-nourished.  HENT:  Head: Normocephalic and atraumatic.  Eyes: EOM are normal. Pupils are equal, round, and reactive to light.  Neck: Normal range of motion. Neck supple.  Cardiovascular: Normal rate, regular rhythm, normal heart sounds and intact distal pulses.  Pulmonary/Chest: Effort normal.  Abdominal: Soft.  Skin: Skin is warm.  Psychiatric: She has a normal mood and affect. Her behavior is normal.  Nursing note and vitals reviewed.    Results for orders placed or performed in visit on 02/21/17  POC Influenza A&B(BINAX/QUICKVUE)  Result Value Ref Range   Influenza A, POC Negative Negative   Influenza B, POC Negative Negative   Assessment and Plan:   1. Diabetes mellitus without complication (HCC) Current symptoms: no polyuria or polydipsia, no chest pain, dyspnea or TIA's, no  numbness, tingling or pain in extremities.  Taking medication compliantly without noted sided effects [x]   YES  []   NO  Episodes of hypoglycemia? []   YES  [x]   NO Maintaining a diabetic diet? [x]   YES  []   NO Trying to exercise on a regular basis? [x]   YES  []   NO  On ACE inhibitor or angiotensin II receptor blocker? [x]   YES  []   NO On Aspirin? [x]   YES  []   NO  Lab Results  Component Value Date   HGBA1C 4.9 03/20/2016    No results found for: Concepcion [place]k  Lab Results  Component Value Date   CHOL 165 03/20/2016   HDL 37.20 (L) 03/20/2016   LDLCALC  85 02/02/2015   LDLDIRECT 75.0 03/20/2016   TRIG (H) 03/20/2016    419.0 Triglyceride is over 400; calculations on Lipids are invalid.   CHOLHDL 4 03/20/2016     Wt Readings from Last 3 Encounters:  05/13/17 229 lb 9.6 oz (104.1 kg)  04/11/17 233 lb 3.2 oz (105.8 kg)  03/21/17 233 lb (105.7 kg)   BP Readings from Last 3 Encounters:  05/13/17 124/86  04/11/17 136/80  03/21/17 112/76   Lab Results  Component Value Date   CREATININE 0.98 01/15/2017   2. Morbid obesity (HCC) The patient is asked to make an attempt to improve diet and exercise patterns to aid in medical management of this problem.   Wt Readings from Last 3 Encounters:  05/13/17 229 lb 9.6 oz (104.1 kg)  04/11/17 233 lb 3.2 oz (105.8 kg)  03/21/17 233 lb (105.7 kg)   3. Dizziness Patient is still waiting on a call from neurology rehab.   . Reviewed expectations re: course of current medical issues. . Discussed self-management of symptoms. . Outlined signs and symptoms indicating need for more acute intervention. . Patient verbalized understanding and all questions were answered. Marland Kitchen Health Maintenance issues including appropriate healthy diet, exercise, and smoking avoidance were discussed with patient. . See orders for this visit as documented in the electronic medical record. . Patient received an After Visit Summary.  CMA served as Neurosurgeon  during this visit. History, Physical, and Plan performed by medical provider. The above documentation has been reviewed and is accurate and complete. Helane Rima, D.O.   Helane Rima, DO [place] 05/17/2017

## 2017-05-16 ENCOUNTER — Encounter: Payer: Self-pay | Admitting: Rehabilitative and Restorative Service Providers"

## 2017-05-16 ENCOUNTER — Ambulatory Visit: Payer: 59 | Attending: Family Medicine | Admitting: Rehabilitative and Restorative Service Providers"

## 2017-05-16 DIAGNOSIS — R293 Abnormal posture: Secondary | ICD-10-CM | POA: Diagnosis present

## 2017-05-16 DIAGNOSIS — R2689 Other abnormalities of gait and mobility: Secondary | ICD-10-CM | POA: Diagnosis not present

## 2017-05-16 DIAGNOSIS — R2681 Unsteadiness on feet: Secondary | ICD-10-CM | POA: Diagnosis present

## 2017-05-16 DIAGNOSIS — M6281 Muscle weakness (generalized): Secondary | ICD-10-CM | POA: Diagnosis present

## 2017-05-16 NOTE — Therapy (Signed)
[facility] [address] [place], [place], [address] Phone: [phone]   Fax:  [phone]  Physical Therapy Evaluation  Patient Details  Name: Kristine Curtis MRN: [id number] Date of Birth: April 29, 1961 Referring Provider: Helane Rima, DO   Encounter Date: 05/16/2017  PT End of Session - 05/16/17 1123    Visit Number  1    Number of Visits  12    Date for PT Re-Evaluation  07/15/17    Authorization Type  [employer] private insurance    PT Start Time  [phone]    PT Stop Time  0935    PT Time Calculation (min)  43 min    Activity Tolerance  Patient tolerated treatment well    Behavior During Therapy  [facility] for tasks assessed/performed       Past Medical History:  Diagnosis Date  . Allergic rhinitis due to pollen   . Asthma   . Chronic combined systolic and diastolic CHF (congestive heart failure) (HCC)    06/2016 Echo: EF 40-45%, Gr1 DD  . Depression   . Family history of polycystic kidney with negative CT 11/16/2012  . Fibromyalgia   . GERD (gastroesophageal reflux disease)   . Hypertension   . Hypothyroidism   . IBS (irritable bowel syndrome)   . Internal hemorrhoids   . Morbid obesity (HCC)   . NICM (nonischemic cardiomyopathy) (HCC)    a. 1999 Cath: nl cors;  b. EF prev as low as 35%;  c. 11/2015 Echo: Ef 40-45%;  d. 06/2016 Echo: EF 40-45%;  e. Lexiscan MV: fixed anterosepta/inferseptal defect w/o ischemia, EF 35%.  . Osteoarthritis   . PVC (premature ventricular contraction)   . Restless leg syndrome   . Sicca (HCC) 11/22/2014  . Vertigo     Past Surgical History:  Procedure Laterality Date  . ABDOMINAL HYSTERECTOMY    . CESAREAN SECTION    . CHOLECYSTECTOMY  10-2008  . COLONOSCOPY    . ESOPHAGOGASTRODUODENOSCOPY    . EYE SURGERY     2 2013 and 1981/DCR of right eye 05/2014  . LUMBAR DISC SURGERY  2016   L3-4  . RIGHT/LEFT HEART CATH AND CORONARY ANGIOGRAPHY N/A 07/11/2016   Procedure: Right/Left Heart  Cath and Coronary Angiography;  Surgeon: Peter M Swaziland, MD;  Location: [facility] INVASIVE CV LAB;  Service: Cardiovascular;  Laterality: N/A;  . SHOULDER ARTHROSCOPY W/ SUBACROMIAL DECOMPRESSION AND DISTAL CLAVICLE EXCISION Right   . SPINE SURGERY     C6-C7, 03/2017  . TUBAL LIGATION  1992    There were no vitals filed for this visit.   Subjective Assessment - 05/16/17 0856    Subjective  The patient reports onset of dizziness in 01/2017 after a flight from [place].  She reports she had difficulty pressurizing her ears with pain.  When dizziness began (not directly after the flight discussed), she experienced constant symptoms x 3-4 days at a time.  She reports she initially thought it was medication related.  The patient describes sensation as "kind of like hallucinatory feelings", denied room spinning, notes imbalance, nausea.  The patient reports that she saw ENT and has been better since procedure to clear her ears.  The patient reports her chief complaints are posture, imbalance, pain in spine (low back and neck).    She is s/p neck surgery C6-C7 level (she is unsure of procedure) 03/2017 and has f/u scheduled with Dr. Danielle Dess next week.     Pertinent History  systolic and diastolic heart  failure, history of PACs and PVCs by Holter monitor, fibromyalgia, restless leg syndrome, hypertension, depression    Patient Stated Goals  Reduce pain, improve balance, improve posture    Currently in Pain?  Yes    Pain Score  -- mild    Pain Location  Neck    Pain Orientation  Upper    Pain Descriptors / Indicators  Aching    Pain Radiating Towards  upper suboccipital region, and shoulders *no pain at surgical site    Pain Onset  More than a month ago    Pain Frequency  Intermittent    Aggravating Factors   fatigue    Pain Relieving Factors  neck brace, pain meds    Effect of Pain on Daily Activities  wears neck brace intermittently t/o the day to rest her neck    Multiple Pain Sites  Yes    Pain Score  10  goes up to 10, lowers with meds    Pain Location  Back    Pain Descriptors / Indicators  Discomfort    Pain Onset  More than a month ago    Pain Frequency  Constant    Aggravating Factors   unsure    Pain Relieving Factors  pain meds         [facility] PT Assessment - 05/16/17 1610      Assessment   Medical Diagnosis  Dizziness    Referring Provider  Helane Rima, DO    Onset Date/Surgical Date  -- 01/2017    Prior Therapy  prior therapy for back      Precautions   Precautions  Cervical    Precaution Comments  neck surgery 03/2017      Restrictions   Weight Bearing Restrictions  No      Balance Screen   Has the patient fallen in the past 6 months  No    Has the patient had a decrease in activity level because of a fear of falling?   No    Is the patient reluctant to leave their home because of a fear of falling?   No      Home Environment   Living Environment  Private residence    Living Arrangements  Spouse/significant other    Home Access  Stairs to enter    Entrance Stairs-Number of Steps  3    Entrance Stairs-Rails  None    Home Layout  One level      Prior Function   Level of Independence  Independent    Vocation  On disability 08/2014      Observation/Other Assessments   Focus on Therapeutic Outcomes (FOTO)   n/a      Posture/Postural Control   Posture/Postural Control  Postural limitations    Postural Limitations  Increased thoracic kyphosis;Anterior pelvic tilt;Increased lumbar lordosis;Rounded Shoulders;Forward head    Posture Comments  In standing patient describes low back with R sacral border pain that radiates laterally to the right hip.  She notes she did have pain extending down the leg, however this has improved since recent neck surgery.  Also notes L side pain, but not as significant as R.       ROM / Strength   AROM / PROM / Strength  AROM;Strength      AROM   Overall AROM Comments  WFLs,  patient appears to be moving head/neck well       Strength    Overall Strength Comments  UEs modified testing due to  recent neck surgery *patient takes minimal resistance with some low back pain noted.  LES:  bilateral hip abductors 3/5, R hip flexion 2/5, L hip flexion 3-/5 *has to functionally lift leg on the right to bring into the car.  bilateral knee extension 4/5, bilateral knee flexion 3+/5, ankle DF 4/5 bilaterally.       Ambulation/Gait   Ambulation/Gait  Yes    Ambulation/Gait Assistance  6: Modified independent (Device/Increase time) slowed pace    Ambulation Distance (Feet)  150 Feet    Gait Pattern  Lateral hip instability;Trendelenburg    Ambulation Surface  Level;Indoor    Gait velocity  2.46 ft/sec    Gait Comments  note veering to the right and then to the left without a consistent pattern of instability; anticipate lateral deviation from midline more related to hip weakness than balance         Vestibular Assessment - 05/16/17 0914      Vestibular Assessment   General Observation  patient ambulates into clinic without device mod indep at slowed pace; h/o migraines at times gets daily migraine      Symptom Behavior   Type of Dizziness  Imbalance    Frequency of Dizziness  at some point in time every day    Duration of Dizziness  varies, can feel "drunk" sensation with walking and veers at times    Aggravating Factors  -- getting up quickly, transitioning to walking    Relieving Factors  Rest      Occulomotor Exam   Occulomotor Alignment  Abnormal eyes adducted, patient notes since birth    Spontaneous  Absent    Smooth Pursuits  Saccades hard to maintain fixation on target    Saccades  Intact    Comment  VISION IMPAIRED:  "I only see out of my right eye" 2 eye surgeries R eye      Vestibulo-Occular Reflex   VOR 1 Head Only (x 1 viewing)  slow VOR performed without difficulty    Comment  Head impulse testing deferred due to recent h/o neck surgery      Other Tests   Comments  positional testing deferred at this time* do  not suspect per subjective and patient is s/p recent neck surgery         Objective measurements completed on examination: See above findings.      [facility] Adult PT Treatment/Exercise - 05/16/17 0921      Self-Care   Self-Care  Other Self-Care Comments    Other Self-Care Comments   Discussed daily stretching routine patient performs from prior therapy and access to gym for post d/c wellness routine.  The patient notes she has joined [employer] in past noting "the trainer put me in bed for 3 days" because of fibromyalgia, chronic fatigue, etc.  She cancelled that membership.  PT discussed walking program 10 minutes and will consider aquatics as a way to slowly increase physical activity.             PT Education - 05/16/17 1121    Education provided  Yes    Education Details  Discussed PT emphasis to be on weakness, core stability and balance training as symptoms not true vertigo.  This aligns with patient goals.      Person(s) Educated  Patient    Methods  Explanation    Comprehension  Verbalized understanding            PT Short Term Goals - 05/16/17 1142  PT SHORT TERM GOAL #1   Title  The patient will be indep with HEP for low back/core stabilization, thoracic extension (when tolerated with recent neck surgery), postural strengthening, LE strengthening and balance.    Time  4    Period  Weeks    Target Date  06/15/17      PT SHORT TERM GOAL #2   Title  The patient will improve LE strength for hip flexion and abduction to 4/5 to demo improving stability for gait/standing activities.    Time  4    Period  Weeks    Target Date  06/15/17      PT SHORT TERM GOAL #3   Title  The patient will improve gait speed from 2.46 to > or equal to 2.8 ft/sec to demo improving functional mobility.    Time  4    Period  Weeks    Target Date  06/15/17      PT SHORT TERM GOAL #4   Title  The patient will be further assessed on balance per Berg or FGA as indicated and LTG to  follow.    Time  4    Period  Weeks    Target Date  06/15/17      PT Long Term Goals - 05/16/17 1143      PT LONG TERM GOAL #1   Title  The patient will verbalize understanding of progression of HEP/community wellness for long term participation in exercise.    Time  8    Target Date  07/15/17      PT LONG TERM GOAL #2   Title  The patient will improve gait speed from 2.46 ft/sec to > or equal to 3.0 ft/sec to demo transition to "full community ambulator" classification of gait and demo improved functional mobility.    Time  8    Period  Weeks    Target Date  07/15/17      PT LONG TERM GOAL #3   Title  The patient will report pain in low back 7/10 at worst.    Baseline  *described greater than 10/10 frequently t/o the week*    Time  8    Period  Weeks    Target Date  07/15/17              Plan - 05/16/17 1133    Clinical Impression Statement  The patient is a 57 yo female presenting to physical therapy for dizziness noting in subjective history that her main goal is to improve posture, reduce pain and improve balance.  She denies a true spinning/vertigo sensation and endorses a general imbalance, foggy sensation that is intermittent in nature.  The patient has h/o oculomotor deficits since birth with h/o 2 eye surgeries and limited L eye vision.  She has postural rounding in thoracic spine with forward head position, and increased lumbar lordosis due to body composition and core weakness.  She has deviation from midline with walking, hip weakness, and general LE weakness.  PT to address deficits in order to improve balance, posture and educate on mgmt of chronic pain.      History and Personal Factors relevant to plan of care:  h/o recent c-spine surgery, h/o lumbar surgery, fibromyalgia, migraines, chronic fatigue, depression    Clinical Presentation  Evolving    Clinical Presentation due to:  increasing imbalance, postural changes    Clinical Decision Making  Moderate     Rehab Potential  Good    Clinical  Impairments Affecting Rehab Potential  chronic nature of LBP, recent neck surgery    PT Frequency  2x / week    PT Duration  4 weeks followed by 1x/week for 4 weeks    PT Treatment/Interventions  ADLs/Self Care Home Management;Therapeutic exercise;Therapeutic activities;Manual techniques;Patient/family education;Gait training;Functional mobility training;Balance training;Neuromuscular re-education;Vestibular    PT Next Visit Plan  Establish HEP for core strengthening (reviewing current HEP), balance exercises, postural strengthening, thoracic extension emphasis, posterior pelvic tilt emphasis/ spinal neutral training.    Consulted and Agree with Plan of Care  Patient       Patient will benefit from skilled therapeutic intervention in order to improve the following deficits and impairments:  Abnormal gait, Obesity, Decreased strength, Pain, Decreased balance, Difficulty walking, Postural dysfunction  Visit Diagnosis: Other abnormalities of gait and mobility  Muscle weakness (generalized)  Unsteadiness on feet  Abnormal posture     Problem List Patient Active Problem List   Diagnosis Date Noted  . Dizziness 03/01/2017  . Morbid obesity (HCC) 02/24/2017  . Sjogren's disease (HCC) 02/24/2017  . Dense breasts 02/03/2017  . ETD (eustachian tube dysfunction) 01/21/2017  . Fatigue 01/21/2017  . Syncope and collapse   . Failed back syndrome of lumbar spine 12/30/2016  . Chronic low back pain with sciatica 05/02/2015  . Chronic systolic congestive heart failure (HCC) 03/22/2015  . Sicca (HCC) 11/22/2014  . Family history of polycystic kidney 11/16/2012  . Vitamin D deficiency 10/09/2009  . Other and unspecified hyperlipidemia 11/15/2008  . Major depression in complete remission (HCC) 08/31/2008  . Essential hypertension 08/31/2008  . Allergic rhinitis 08/31/2008  . Asthma 08/31/2008  . GERD 08/31/2008  . Osteoarthritis 08/31/2008  .  Hypothyroidism 08/30/2008  . Classical migraine without intractable migraine 08/30/2008  . Primary fibromyalgia syndrome 08/30/2008  . Abnormal Pap smear of cervix 05/07/1995    Duston Smolenski, PT 05/16/2017, 11:40 AM  [facility] [address] [place], [place], [address] Phone: [phone]   Fax:  [phone]  Name: Kristine Curtis MRN: [id number] Date of Birth: May 25, 1960

## 2017-05-19 ENCOUNTER — Encounter: Payer: Self-pay | Admitting: Family Medicine

## 2017-05-19 MED ORDER — PEG 3350-KCL-NABCB-NACL-NASULF 236 G PO SOLR: 4000.0000 mL | Freq: Once | ORAL | 0 refills | Status: AC

## 2017-05-22 ENCOUNTER — Ambulatory Visit: Payer: Self-pay

## 2017-05-22 NOTE — Telephone Encounter (Signed)
Pt calling with c/o passing 2 small clots (without stool). Pt describes clots as pea to a small marble in size. Pt states she has internal and external hemorrhoids. Currently constipated and on a bowel clean out taking Dulcolax last night and this am and has taken in about half of the Golytely. Disposition recommended to see physician within 24 hours. No appt available with PCP but NT offered to try and get pt an appt with another provider. Pt refused. Care advice given. Pt states will call back if any more bleeding occurs.  Routing to office.  Reason for Disposition . MODERATE rectal bleeding (small blood clots, passing blood without stool, or toilet water turns red)  Answer Assessment - Initial Assessment Questions 1. APPEARANCE of BLOOD: "What color is it?" "Is it passed separately, on the surface of the stool, or mixed in with the stool?"      "very bright red with twinge of dark looking blood like a brown looking color" blood was by itself 2. AMOUNT: "How much blood was passed?"      Looked like a clots x 2 size of pea to a small marble 3. FREQUENCY: "How many times has blood been passed with the stools?"      twice 4. ONSET: "When was the blood first seen in the stools?" (Days or weeks)      This am  5. DIARRHEA: "Is there also some diarrhea?" If so, ask: "How many diarrhea stools were passed in past 24 hours?"      No stool has been passed. Last BM 05/19/17 6. CONSTIPATION: "Do you have constipation?" If so, "How bad is it?"     Yes- currently drinking (Golytely) colon cleansing liquid and is about half way done and is taking Dulcolax last night and this am  7. RECURRENT SYMPTOMS: "Have you had blood in your stools before?" If so, ask: "When was the last time?" and "What happened that time?"      Yes has internal and external hemorrhoids. The last time was 05/19/17 was a small amount. 8. BLOOD THINNERS: "Do you take any blood thinners?" (e.g., Coumadin/warfarin, Pradaxa/dabigatran,  aspirin)     no 9. OTHER SYMPTOMS: "Do you have any other symptoms?"  (e.g., abdominal pain, vomiting, dizziness, fever)     Stomach feels bloated from the Golytely. No fever, vomiting or dizziness 10. PREGNANCY: "Is there any chance you are pregnant?" "When was your last menstrual period?"       n/a  Protocols used: RECTAL BLEEDING-A-AH

## 2017-05-22 NOTE — Telephone Encounter (Signed)
Please advise 

## 2017-05-22 NOTE — Telephone Encounter (Signed)
Please be advised of the note below.  °

## 2017-05-23 ENCOUNTER — Encounter: Payer: Self-pay | Admitting: Rehabilitative and Restorative Service Providers"

## 2017-05-23 ENCOUNTER — Ambulatory Visit: Payer: 59 | Admitting: Rehabilitative and Restorative Service Providers"

## 2017-05-23 DIAGNOSIS — R2689 Other abnormalities of gait and mobility: Secondary | ICD-10-CM

## 2017-05-23 DIAGNOSIS — R293 Abnormal posture: Secondary | ICD-10-CM

## 2017-05-23 DIAGNOSIS — M6281 Muscle weakness (generalized): Secondary | ICD-10-CM

## 2017-05-23 DIAGNOSIS — R2681 Unsteadiness on feet: Secondary | ICD-10-CM

## 2017-05-23 NOTE — Patient Instructions (Signed)
Thoracic Self-Mobilization (Supine)    With rolled towel placed lengthwise at lower ribs level, lie back on towel with arms outstretched. Hold _2 minutes. Relax. Repeat __1-2__ times per day as needed for postural stretching.  http://orth.exer.us/1001   Copyright  VHI. All rights reserved.  Extensors, Supine    Lie supine, head on small, rolled towel or one pillow.  Gently tuck chin and bring toward chest. Hold __5_ seconds. Repeat __10_ times per session. Do _1-2__ sessions per day.  Copyright  VHI. All rights reserved.   Bridge    Lie back, legs bent, SQUEEZE BALL BETWEEN YOUR KNEES.  Inhale, pressing hips up. Keeping ribs in, lengthen lower back. Exhale, rolling down along spine from top. Repeat _10-15___ times. Do __1-2__ sessions per day.  http://pm.exer.us/55   Copyright  VHI. All rights reserved.   Strengthening: Hip Abduction (Side-Lying)    LIE ON YOUR SIDE AND PLACE A FOLDED PILLOW UNDER TOP LEG (AT ANKLE).Tighten muscles on front of left thigh, then lift leg _6___ inches from surface, keeping knee locked.  Repeat __10__ times per set. Do __1__ sets per session. Do __1-2__ sessions per day.  http://orth.exer.us/623   Copyright  VHI. All rights reserved.   Feet Apart, Varied Arm Positions - Eyes Closed    Stand with feet shoulder width apart and arms out. Close eyes and visualize upright position. Hold __30__ seconds. Repeat _3___ times per session. Do _1-2___ sessions per day.  HAVE YOUR HUSBAND STAND BY FOR SUPPORT INITIALLY. Copyright  VHI. All rights reserved.

## 2017-05-23 NOTE — Therapy (Signed)
[facility] [address] [place], [place], [address] Phone: [phone]   Fax:  [phone]  Physical Therapy Treatment  Patient Details  Name: Kristine Curtis MRN: [id number] Date of Birth: Sep 17, 1960 Referring Provider: Helane Rima, DO   Encounter Date: 05/23/2017  PT End of Session - 05/23/17 1601    Visit Number  2    Number of Visits  12    Date for PT Re-Evaluation  07/15/17    Authorization Type  [employer] private insurance    PT Start Time  [phone]    PT Stop Time  0935    PT Time Calculation (min)  47 min    Activity Tolerance  Patient tolerated treatment well    Behavior During Therapy  [facility] for tasks assessed/performed       Past Medical History:  Diagnosis Date  . Allergic rhinitis due to pollen   . Asthma   . Chronic combined systolic and diastolic CHF (congestive heart failure) (HCC)    06/2016 Echo: EF 40-45%, Gr1 DD  . Depression   . Family history of polycystic kidney with negative CT 11/16/2012  . Fibromyalgia   . GERD (gastroesophageal reflux disease)   . Hypertension   . Hypothyroidism   . IBS (irritable bowel syndrome)   . Internal hemorrhoids   . Morbid obesity (HCC)   . NICM (nonischemic cardiomyopathy) (HCC)    a. 1999 Cath: nl cors;  b. EF prev as low as 35%;  c. 11/2015 Echo: Ef 40-45%;  d. 06/2016 Echo: EF 40-45%;  e. Lexiscan MV: fixed anterosepta/inferseptal defect w/o ischemia, EF 35%.  . Osteoarthritis   . PVC (premature ventricular contraction)   . Restless leg syndrome   . Sicca (HCC) 11/22/2014  . Vertigo     Past Surgical History:  Procedure Laterality Date  . ABDOMINAL HYSTERECTOMY    . CESAREAN SECTION    . CHOLECYSTECTOMY  10-2008  . COLONOSCOPY    . ESOPHAGOGASTRODUODENOSCOPY    . EYE SURGERY     2 2013 and 1981/DCR of right eye 05/2014  . LUMBAR DISC SURGERY  2016   L3-4  . RIGHT/LEFT HEART CATH AND CORONARY ANGIOGRAPHY N/A 07/11/2016   Procedure: Right/Left Heart  Cath and Coronary Angiography;  Surgeon: Peter M Swaziland, MD;  Location: [facility] INVASIVE CV LAB;  Service: Cardiovascular;  Laterality: N/A;  . SHOULDER ARTHROSCOPY W/ SUBACROMIAL DECOMPRESSION AND DISTAL CLAVICLE EXCISION Right   . SPINE SURGERY     C6-C7, 03/2017  . TUBAL LIGATION  1992    There were no vitals filed for this visit.  Subjective Assessment - 05/23/17 0848    Subjective  The patient returned to neursurgeon this week and came with order that states "continue postural exercises including hydrotherapy" other spondylosis with myelopathy, cervical region as the diagnosis.      Pertinent History  systolic and diastolic heart failure, history of PACs and PVCs by Holter monitor, fibromyalgia, restless leg syndrome, hypertension, depression    Patient Stated Goals  Reduce pain, improve balance, improve posture    Currently in Pain?  Yes    Pain Score  4     Pain Location  Hip    Pain Orientation  Right    Pain Descriptors / Indicators  Aching;Sore    Pain Onset  More than a month ago    Pain Frequency  Intermittent    Aggravating Factors   worse in morning    Pain Relieving Factors  stretching  [facility] Adult PT Treatment/Exercise - 05/23/17 6922      Neuro Re-ed    Neuro Re-ed Details   Balance HEP added with corner standing + feet apart + eyes closed with increased sway -- near support for safety.      Exercises   Exercises  Other Exercises;Neck;Shoulder;Lumbar;Knee/Hip    Other Exercises   PATIENT DEMO:  Her current HEP is hooklying with arms stretching overhead and out to the side, she does bridging to reposition.  Trunk rotation/lumbar stretch with abdominal contraction, clamshells for hip abduction (she feels increased pain when doing this with R leg), long sitting hamstring stretch, butterfly stretch, piriformis stretch.   She also does bridging-- initially doing minimal bridge but working through ROM as she progresses.   Patient also  demonstrated that she self mobilizes her neck (supine holds neck and pulls up for distraction)      Neck Exercises: Supine   Other Supine Exercise  Supine towel roll stretch x 2 minutes for anterior chest stretching    Other Supine Exercise  Chin tucks with towel roll with isometric holds x 5 reps x 2 sets.       Neck Exercises: Prone   Other Prone Exercise  Prone on elbows, emphasizing contraction of abdominals to reduce strain on low back.  Worked on chin tuck in this position with tactile cues and scapular retraction.       Lumbar Exercises: Seated   Other Seated Lumbar Exercises  PIriformis stretching in seated position (had to use strap at home in supine due to body habitus).       Lumbar Exercises: Supine   Other Supine Lumbar Exercises  Recommended continued bridging for home modifying by adding ball squeeze and discussing lifting segment by segment to engage core more than straight bridge      Knee/Hip Exercises: Sidelying   Hip ABduction  10 reps with cues for maintaining trunk stable.               PT Short Term Goals - 05/16/17 1142      PT SHORT TERM GOAL #1   Title  The patient will be indep with HEP for low back/core stabilization, thoracic extension (when tolerated with recent neck surgery), postural strengthening, LE strengthening and balance.    Time  4    Period  Weeks    Target Date  06/15/17      PT SHORT TERM GOAL #2   Title  The patient will improve LE strength for hip flexion and abduction to 4/5 to demo improving stability for gait/standing activities.    Time  4    Period  Weeks    Target Date  06/15/17      PT SHORT TERM GOAL #3   Title  The patient will improve gait speed from 2.46 to > or equal to 2.8 ft/sec to demo improving functional mobility.    Time  4    Period  Weeks    Target Date  06/15/17      PT SHORT TERM GOAL #4   Title  The patient will be further assessed on balance per Berg or FGA as indicated and LTG to follow.    Time  4     Period  Weeks    Target Date  06/15/17        PT Long Term Goals - 05/16/17 1143      PT LONG TERM GOAL #1   Title  The patient will verbalize understanding  of progression of HEP/community wellness for long term participation in exercise.    Time  8    Target Date  07/15/17      PT LONG TERM GOAL #2   Title  The patient will improve gait speed from 2.46 ft/sec to > or equal to 3.0 ft/sec to demo transition to "full community ambulator" classification of gait and demo improved functional mobility.    Time  8    Period  Weeks    Target Date  07/15/17      PT LONG TERM GOAL #3   Title  The patient will report pain in low back 7/10 at worst.    Baseline  *described greater than 10/10 frequently t/o the week*    Time  8    Period  Weeks    Target Date  07/15/17            Plan - 05/23/17 1605    Clinical Impression Statement  The patient tolerated HEP well today.  PT modified some of current HEP to be more specific to needs/impairments.  The patient is flexible and spends a lot of time stretching.  We discussed adding more stabilization/strengthening exercises to routine to make long term changes for pain mgmt.    PT Treatment/Interventions  ADLs/Self Care Home Management;Therapeutic exercise;Therapeutic activities;Manual techniques;Patient/family education;Gait training;Functional mobility training;Balance training;Neuromuscular re-education;Vestibular    PT Next Visit Plan  check HEP, modify if needed.  Add strengthening with prone scapular retraction, light resistance/ theraband, core stability for transverse abdominus.    Consulted and Agree with Plan of Care  Patient       Patient will benefit from skilled therapeutic intervention in order to improve the following deficits and impairments:  Abnormal gait, Obesity, Decreased strength, Pain, Decreased balance, Difficulty walking, Postural dysfunction  Visit Diagnosis: Other abnormalities of gait and mobility  Muscle  weakness (generalized)  Unsteadiness on feet  Abnormal posture     Problem List Patient Active Problem List   Diagnosis Date Noted  . Dizziness 03/01/2017  . Morbid obesity (HCC) 02/24/2017  . Sjogren's disease (HCC) 02/24/2017  . Dense breasts 02/03/2017  . ETD (eustachian tube dysfunction) 01/21/2017  . Fatigue 01/21/2017  . Syncope and collapse   . Failed back syndrome of lumbar spine 12/30/2016  . Chronic low back pain with sciatica 05/02/2015  . Chronic systolic congestive heart failure (HCC) 03/22/2015  . Sicca (HCC) 11/22/2014  . Family history of polycystic kidney 11/16/2012  . Vitamin D deficiency 10/09/2009  . Other and unspecified hyperlipidemia 11/15/2008  . Major depression in complete remission (HCC) 08/31/2008  . Essential hypertension 08/31/2008  . Allergic rhinitis 08/31/2008  . Asthma 08/31/2008  . GERD 08/31/2008  . Osteoarthritis 08/31/2008  . Hypothyroidism 08/30/2008  . Classical migraine without intractable migraine 08/30/2008  . Primary fibromyalgia syndrome 08/30/2008  . Abnormal Pap smear of cervix 05/07/1995    Riya Huxford, PT 05/23/2017, 4:07 PM  [facility] [address] [place], [place], [address] Phone: [phone]   Fax:  [phone]  Name: JANUARY BERGTHOLD MRN: [id number] Date of Birth: [birth year]

## 2017-05-24 ENCOUNTER — Encounter: Payer: Self-pay | Admitting: Family Medicine

## 2017-05-24 NOTE — Telephone Encounter (Signed)
Mychart message sent to the patient .

## 2017-05-26 ENCOUNTER — Ambulatory Visit: Payer: 59 | Admitting: Rehabilitative and Restorative Service Providers"

## 2017-05-26 ENCOUNTER — Encounter: Payer: Self-pay | Admitting: Rehabilitative and Restorative Service Providers"

## 2017-05-26 DIAGNOSIS — R2689 Other abnormalities of gait and mobility: Secondary | ICD-10-CM

## 2017-05-26 DIAGNOSIS — M6281 Muscle weakness (generalized): Secondary | ICD-10-CM

## 2017-05-26 DIAGNOSIS — R2681 Unsteadiness on feet: Secondary | ICD-10-CM

## 2017-05-26 DIAGNOSIS — R293 Abnormal posture: Secondary | ICD-10-CM

## 2017-05-26 NOTE — Patient Instructions (Signed)
Chest Flexibility: Shoulder Opener (Doorframe)    Roll shoulders back and down, pressing forward against doorframe. Hold for _10___ breaths. Repeat __3__ times.  Copyright  VHI. All rights reserved.   Horizontal Abduction (Resistive Band)    With arms at shoulder level, keep elbows straight.  CAN DO BOTH ARMS AT THE SAME TIME * PALMS UP *.  Hold __5__ seconds. Repeat _10___ times. Do __1-2__ sessions per day.  Copyright  VHI. All rights reserved.    Extensors, Supine    Lie supine, head on small, rolled towel or one pillow.  Gently tuck chin and bring toward chest. Hold __5_ seconds. Repeat __10_ times per session. Do _1-2__ sessions per day.  Copyright  VHI. All rights reserved.   Bridge    Lie back, legs bent, SQUEEZE BALL BETWEEN YOUR KNEES.  Inhale, pressing hips up. Keeping ribs in, lengthen lower back. Exhale, rolling down along spine from top. Repeat _10-15___ times. Do __1-2__ sessions per day.  http://pm.exer.us/55   Copyright  VHI. All rights reserved.   Hip Flexion - Supine    Lying on back, knees bent, feet on floor, bend hips, bringing knees toward trunk.  CONTRACT BELLY BUTTON TO SPINE FIRST* Repeat _10__ times EACH LEG. Do _1-2__ times per day.  Copyright  VHI. All rights reserved.    Strengthening: Hip Abduction (Side-Lying)    LIE ON YOUR SIDE AND PLACE A FOLDED PILLOW UNDER TOP LEG (AT ANKLE).Tighten muscles on front of left thigh, then lift leg _6___ inches from surface, keeping knee locked.  Repeat __10__ times per set. Do __1__ sets per session. Do __1-2__ sessions per day.  http://orth.exer.us/623   Copyright  VHI. All rights reserved.   Feet Apart, Varied Arm Positions - Eyes Closed    Stand with feet shoulder width apart and arms out. Close eyes and visualize upright position. Hold __30__ seconds. Repeat _3___ times per session. Do _1-2___ sessions per day.  Copyright  VHI. All rights reserved.   Modified  Plank   Rise up on elbows.  Push into "plank" position keeping knees on the ground, lifting hips and stomach off of the ground. Hold __10__ seconds. Repeat __5__ times per set. Do _1-2___ sessions per day.  http://orth.exer.us/93   Copyright  VHI. All rights reserved.

## 2017-05-26 NOTE — Therapy (Signed)
[facility] [address] [place], [place], [address] Phone: [phone]   Fax:  [phone]  Physical Therapy Treatment  Patient Details  Name: Kristine Curtis MRN: [id number] Date of Birth: 1961-03-28 Referring Provider: Helane Rima, DO   Encounter Date: 05/26/2017  PT End of Session - 05/26/17 1512    Visit Number  3    Number of Visits  12    Date for PT Re-Evaluation  07/15/17    Authorization Type  [employer] private insurance    PT Start Time  1325    PT Stop Time  1405    PT Time Calculation (min)  40 min    Activity Tolerance  Patient tolerated treatment well    Behavior During Therapy  [facility] for tasks assessed/performed       Past Medical History:  Diagnosis Date  . Allergic rhinitis due to pollen   . Asthma   . Chronic combined systolic and diastolic CHF (congestive heart failure) (HCC)    06/2016 Echo: EF 40-45%, Gr1 DD  . Depression   . Family history of polycystic kidney with negative CT 11/16/2012  . Fibromyalgia   . GERD (gastroesophageal reflux disease)   . Hypertension   . Hypothyroidism   . IBS (irritable bowel syndrome)   . Internal hemorrhoids   . Morbid obesity (HCC)   . NICM (nonischemic cardiomyopathy) (HCC)    a. 1999 Cath: nl cors;  b. EF prev as low as 35%;  c. 11/2015 Echo: Ef 40-45%;  d. 06/2016 Echo: EF 40-45%;  e. Lexiscan MV: fixed anterosepta/inferseptal defect w/o ischemia, EF 35%.  . Osteoarthritis   . PVC (premature ventricular contraction)   . Restless leg syndrome   . Sicca (HCC) 11/22/2014  . Vertigo     Past Surgical History:  Procedure Laterality Date  . ABDOMINAL HYSTERECTOMY    . CESAREAN SECTION    . CHOLECYSTECTOMY  10-2008  . COLONOSCOPY    . ESOPHAGOGASTRODUODENOSCOPY    . EYE SURGERY     2 2013 and 1981/DCR of right eye 05/2014  . LUMBAR DISC SURGERY  2016   L3-4  . RIGHT/LEFT HEART CATH AND CORONARY ANGIOGRAPHY N/A 07/11/2016   Procedure: Right/Left Heart  Cath and Coronary Angiography;  Surgeon: Peter M Swaziland, MD;  Location: [facility] INVASIVE CV LAB;  Service: Cardiovascular;  Laterality: N/A;  . SHOULDER ARTHROSCOPY W/ SUBACROMIAL DECOMPRESSION AND DISTAL CLAVICLE EXCISION Right   . SPINE SURGERY     C6-C7, 03/2017  . TUBAL LIGATION  1992    There were no vitals filed for this visit.  Subjective Assessment - 05/26/17 1326    Subjective  The patient reports that her balance is better sock footed than with shoes on.  She notes that the exercises are going well and she is not sore from exercises.      Patient Stated Goals  Reduce pain, improve balance, improve posture    Currently in Pain?  Yes    Pain Score  2     Pain Location  Hip    Pain Orientation  Right    Pain Descriptors / Indicators  Aching;Sore    Pain Onset  More than a month ago    Pain Frequency  Intermittent    Aggravating Factors   worse in morning    Pain Relieving Factors  stretching                      [facility] Adult  PT Treatment/Exercise - 05/26/17 1328      Exercises   Exercises  Neck;Lumbar    Other Exercises   Reviewed home exercises with patient's towel roll from home of chin tucks and towel roll stretch supine.        Neck Exercises: Standing   Other Standing Exercises  Scapular retraction standing with red band and palms up x 10 reps also engaging transverse abdominus.  Standing diagonal scapular retraction x 5 reps bilaterally.     Other Standing Exercises  Door frame stretch for anterior pec stretching.  Also perfomred serratus anterior stretch holding door frame and leaning posteriorly.      Neck Exercises: Prone   Other Prone Exercise  Prone on elbows working on core stability, then modified plank keeping knees in contact with the floor.  Prone chin tuck.        Lumbar Exercises: Supine   Bent Knee Raise  10 reps;3 seconds    Bent Knee Raise Limitations  emphasizing contraction of transverse abdominus             PT Education -  05/26/17 1512    Education provided  Yes    Education Details  Updated HEP changing pec stretch, adding supine marching, and modified plank    Person(s) Educated  Patient    Methods  Explanation;Demonstration;Handout    Comprehension  Verbalized understanding;Returned demonstration       PT Short Term Goals - 05/16/17 1142      PT SHORT TERM GOAL #1   Title  The patient will be indep with HEP for low back/core stabilization, thoracic extension (when tolerated with recent neck surgery), postural strengthening, LE strengthening and balance.    Time  4    Period  Weeks    Target Date  06/15/17      PT SHORT TERM GOAL #2   Title  The patient will improve LE strength for hip flexion and abduction to 4/5 to demo improving stability for gait/standing activities.    Time  4    Period  Weeks    Target Date  06/15/17      PT SHORT TERM GOAL #3   Title  The patient will improve gait speed from 2.46 to > or equal to 2.8 ft/sec to demo improving functional mobility.    Time  4    Period  Weeks    Target Date  06/15/17      PT SHORT TERM GOAL #4   Title  The patient will be further assessed on balance per Berg or FGA as indicated and LTG to follow.    Time  4    Period  Weeks    Target Date  06/15/17        PT Long Term Goals - 05/16/17 1143      PT LONG TERM GOAL #1   Title  The patient will verbalize understanding of progression of HEP/community wellness for long term participation in exercise.    Time  8    Target Date  07/15/17      PT LONG TERM GOAL #2   Title  The patient will improve gait speed from 2.46 ft/sec to > or equal to 3.0 ft/sec to demo transition to "full community ambulator" classification of gait and demo improved functional mobility.    Time  8    Period  Weeks    Target Date  07/15/17      PT LONG TERM GOAL #3   Title  The patient will report pain in low back 7/10 at worst.    Baseline  *described greater than 10/10 frequently t/o the week*    Time  8     Period  Weeks    Target Date  07/15/17            Plan - 05/26/17 1516    Clinical Impression Statement  The patient is reporting reduced pain today and responds well to exercises in therapy.  PT to continue to progress strengthening/stabilization to tolerance.     PT Treatment/Interventions  ADLs/Self Care Home Management;Therapeutic exercise;Therapeutic activities;Manual techniques;Patient/family education;Gait training;Functional mobility training;Balance training;Neuromuscular re-education;Vestibular    PT Next Visit Plan  Add strengthening prone scap retraction, theraband, core stabilization activities.    Consulted and Agree with Plan of Care  Patient       Patient will benefit from skilled therapeutic intervention in order to improve the following deficits and impairments:  Abnormal gait, Obesity, Decreased strength, Pain, Decreased balance, Difficulty walking, Postural dysfunction  Visit Diagnosis: Other abnormalities of gait and mobility  Muscle weakness (generalized)  Unsteadiness on feet  Abnormal posture     Problem List Patient Active Problem List   Diagnosis Date Noted  . Dizziness 03/01/2017  . Morbid obesity (HCC) 02/24/2017  . Sjogren's disease (HCC) 02/24/2017  . Dense breasts 02/03/2017  . ETD (eustachian tube dysfunction) 01/21/2017  . Fatigue 01/21/2017  . Syncope and collapse   . Failed back syndrome of lumbar spine 12/30/2016  . Chronic low back pain with sciatica 05/02/2015  . Chronic systolic congestive heart failure (HCC) 03/22/2015  . Sicca (HCC) 11/22/2014  . Family history of polycystic kidney 11/16/2012  . Vitamin D deficiency 10/09/2009  . Other and unspecified hyperlipidemia 11/15/2008  . Major depression in complete remission (HCC) 08/31/2008  . Essential hypertension 08/31/2008  . Allergic rhinitis 08/31/2008  . Asthma 08/31/2008  . GERD 08/31/2008  . Osteoarthritis 08/31/2008  . Hypothyroidism 08/30/2008  . Classical migraine  without intractable migraine 08/30/2008  . Primary fibromyalgia syndrome 08/30/2008  . Abnormal Pap smear of cervix 05/07/1995    Kristine Curtis, PT 05/26/2017, 3:17 PM  [facility] [address] [place], [place], [address] Phone: [phone]   Fax:  [phone]  Name: Kristine Curtis MRN: [id number] Date of Birth: 19-Oct-1960

## 2017-05-30 ENCOUNTER — Ambulatory Visit: Payer: 59 | Admitting: Rehabilitative and Restorative Service Providers"

## 2017-05-30 ENCOUNTER — Encounter: Payer: Self-pay | Admitting: Rehabilitative and Restorative Service Providers"

## 2017-05-30 DIAGNOSIS — M6281 Muscle weakness (generalized): Secondary | ICD-10-CM

## 2017-05-30 DIAGNOSIS — R2689 Other abnormalities of gait and mobility: Secondary | ICD-10-CM | POA: Diagnosis not present

## 2017-05-30 DIAGNOSIS — R293 Abnormal posture: Secondary | ICD-10-CM

## 2017-05-30 DIAGNOSIS — R2681 Unsteadiness on feet: Secondary | ICD-10-CM

## 2017-05-30 NOTE — Therapy (Signed)
[facility] [address] [place], [place], [address] Phone: [phone]   Fax:  [phone]  Physical Therapy Treatment  Patient Details  Name: Kristine Curtis MRN: [id number] Date of Birth: 02-Jun-1960 Referring Provider: Helane Rima, DO   Encounter Date: 05/30/2017  PT End of Session - 05/30/17 1612    Visit Number  4    Number of Visits  12    Date for PT Re-Evaluation  07/15/17    Authorization Type  [employer] private insurance    PT Start Time  1320    PT Stop Time  1400    PT Time Calculation (min)  40 min    Activity Tolerance  Patient tolerated treatment well    Behavior During Therapy  [facility] for tasks assessed/performed       Past Medical History:  Diagnosis Date  . Allergic rhinitis due to pollen   . Asthma   . Chronic combined systolic and diastolic CHF (congestive heart failure) (HCC)    06/2016 Echo: EF 40-45%, Gr1 DD  . Depression   . Family history of polycystic kidney with negative CT 11/16/2012  . Fibromyalgia   . GERD (gastroesophageal reflux disease)   . Hypertension   . Hypothyroidism   . IBS (irritable bowel syndrome)   . Internal hemorrhoids   . Morbid obesity (HCC)   . NICM (nonischemic cardiomyopathy) (HCC)    a. 1999 Cath: nl cors;  b. EF prev as low as 35%;  c. 11/2015 Echo: Ef 40-45%;  d. 06/2016 Echo: EF 40-45%;  e. Lexiscan MV: fixed anterosepta/inferseptal defect w/o ischemia, EF 35%.  . Osteoarthritis   . PVC (premature ventricular contraction)   . Restless leg syndrome   . Sicca (HCC) 11/22/2014  . Vertigo     Past Surgical History:  Procedure Laterality Date  . ABDOMINAL HYSTERECTOMY    . CESAREAN SECTION    . CHOLECYSTECTOMY  10-2008  . COLONOSCOPY    . ESOPHAGOGASTRODUODENOSCOPY    . EYE SURGERY     2 2013 and 1981/DCR of right eye 05/2014  . LUMBAR DISC SURGERY  2016   L3-4  . RIGHT/LEFT HEART CATH AND CORONARY ANGIOGRAPHY N/A 07/11/2016   Procedure: Right/Left Heart  Cath and Coronary Angiography;  Surgeon: Peter M Swaziland, MD;  Location: [facility] INVASIVE CV LAB;  Service: Cardiovascular;  Laterality: N/A;  . SHOULDER ARTHROSCOPY W/ SUBACROMIAL DECOMPRESSION AND DISTAL CLAVICLE EXCISION Right   . SPINE SURGERY     C6-C7, 03/2017  . TUBAL LIGATION  1992    There were no vitals filed for this visit.  Subjective Assessment - 05/30/17 1320    Subjective  The patient reports "I've been in bed 3 days, since I left here".  She reports stomach upset due to a medication change.  She is eating every 2 hours/ small amounts due to stomach upset.  The patient reports she is not able to do doorway stretch due to narrow doorways at home.      Pertinent History  systolic and diastolic heart failure, history of PACs and PVCs by Holter monitor, fibromyalgia, restless leg syndrome, hypertension, depression    Patient Stated Goals  Reduce pain, improve balance, improve posture    Currently in Pain?  Yes    Pain Score  8  low back, due to laying down so much this week.  R side shoulder and scapular pain radiating to suboccipital region 7-8/10 in the neck    Pain Location  -- R  side neck and low back    Pain Descriptors / Indicators  Aching    Pain Type  Chronic pain    Pain Onset  More than a month ago    Pain Frequency  Intermittent    Aggravating Factors   worse due to being in the bed x days    Pain Relieving Factors  stretching                      [facility] Adult PT Treatment/Exercise - 05/30/17 1613      Exercises   Exercises  Other Exercises    Other Exercises   Quadriped cat/cow with emphasis on thoracic mobility, prone on elbows with core stability to support low back.  Supine low back relaxation with legs on ball and small amplitude rocking for relaxation.  Performed bridges with ball and knee ot chest with ball x 10 reps each with cues.  Quadriped shoulder extension x 5 reps each UE.   Seated cervical stretching upper trapezius and levator.  Modified  standing doorway stretch, attempted corner chest stretch.  Modified prone plank press up.               PT Short Term Goals - 05/16/17 1142      PT SHORT TERM GOAL #1   Title  The patient will be indep with HEP for low back/core stabilization, thoracic extension (when tolerated with recent neck surgery), postural strengthening, LE strengthening and balance.    Time  4    Period  Weeks    Target Date  06/15/17      PT SHORT TERM GOAL #2   Title  The patient will improve LE strength for hip flexion and abduction to 4/5 to demo improving stability for gait/standing activities.    Time  4    Period  Weeks    Target Date  06/15/17      PT SHORT TERM GOAL #3   Title  The patient will improve gait speed from 2.46 to > or equal to 2.8 ft/sec to demo improving functional mobility.    Time  4    Period  Weeks    Target Date  06/15/17      PT SHORT TERM GOAL #4   Title  The patient will be further assessed on balance per Berg or FGA as indicated and LTG to follow.    Time  4    Period  Weeks    Target Date  06/15/17        PT Long Term Goals - 05/16/17 1143      PT LONG TERM GOAL #1   Title  The patient will verbalize understanding of progression of HEP/community wellness for long term participation in exercise.    Time  8    Target Date  07/15/17      PT LONG TERM GOAL #2   Title  The patient will improve gait speed from 2.46 ft/sec to > or equal to 3.0 ft/sec to demo transition to "full community ambulator" classification of gait and demo improved functional mobility.    Time  8    Period  Weeks    Target Date  07/15/17      PT LONG TERM GOAL #3   Title  The patient will report pain in low back 7/10 at worst.    Baseline  *described greater than 10/10 frequently t/o the week*    Time  8    Period  Weeks    Target Date  07/15/17            Plan - 05/30/17 1355    Clinical Impression Statement  The patient reports she had a headache this week associated with  dizziness and visual changes + nausea.  She felt that her nausea was related to a medication change, however it may be beneficial to consider neurology referral due to h/o migraines (was a [facility] at [facility] before [facility] closed).  She notes this has been discussed with MD in the past.  Patient tolerating exercises well with set back this week due to nausea and headache.  Plan to continue to progress to tolerance.     PT Treatment/Interventions  ADLs/Self Care Home Management;Therapeutic exercise;Therapeutic activities;Manual techniques;Patient/family education;Gait training;Functional mobility training;Balance training;Neuromuscular re-education;Vestibular    PT Next Visit Plan  Add strengthening prone scap retraction, theraband, core stabilization activities.    Consulted and Agree with Plan of Care  Patient       Patient will benefit from skilled therapeutic intervention in order to improve the following deficits and impairments:  Abnormal gait, Obesity, Decreased strength, Pain, Decreased balance, Difficulty walking, Postural dysfunction  Visit Diagnosis: Other abnormalities of gait and mobility  Muscle weakness (generalized)  Abnormal posture  Unsteadiness on feet     Problem List Patient Active Problem List   Diagnosis Date Noted  . Dizziness 03/01/2017  . Morbid obesity (HCC) 02/24/2017  . Sjogren's disease (HCC) 02/24/2017  . Dense breasts 02/03/2017  . ETD (eustachian tube dysfunction) 01/21/2017  . Fatigue 01/21/2017  . Syncope and collapse   . Failed back syndrome of lumbar spine 12/30/2016  . Chronic low back pain with sciatica 05/02/2015  . Chronic systolic congestive heart failure (HCC) 03/22/2015  . Sicca (HCC) 11/22/2014  . Family history of polycystic kidney 11/16/2012  . Vitamin D deficiency 10/09/2009  . Other and unspecified hyperlipidemia 11/15/2008  . Major depression in complete remission (HCC) 08/31/2008  . Essential hypertension 08/31/2008   . Allergic rhinitis 08/31/2008  . Asthma 08/31/2008  . GERD 08/31/2008  . Osteoarthritis 08/31/2008  . Hypothyroidism 08/30/2008  . Classical migraine without intractable migraine 08/30/2008  . Primary fibromyalgia syndrome 08/30/2008  . Abnormal Pap smear of cervix 05/07/1995    Rani Idler, PT 05/30/2017, 4:17 PM  [facility] [address] [place], [place], [address] Phone: [phone]   Fax:  [phone]  Name: Kristine Curtis MRN: [id number] Date of Birth: 11/01/1960

## 2017-06-03 ENCOUNTER — Ambulatory Visit: Payer: 59 | Admitting: Family Medicine

## 2017-06-03 ENCOUNTER — Encounter: Payer: Self-pay | Admitting: Family Medicine

## 2017-06-04 ENCOUNTER — Ambulatory Visit: Payer: 59 | Admitting: Family Medicine

## 2017-06-04 ENCOUNTER — Telehealth: Payer: Self-pay | Admitting: Surgical

## 2017-06-04 ENCOUNTER — Encounter: Payer: Self-pay | Admitting: Rehabilitative and Restorative Service Providers"

## 2017-06-04 ENCOUNTER — Ambulatory Visit: Payer: 59 | Admitting: Rehabilitative and Restorative Service Providers"

## 2017-06-04 DIAGNOSIS — R2681 Unsteadiness on feet: Secondary | ICD-10-CM

## 2017-06-04 DIAGNOSIS — R293 Abnormal posture: Secondary | ICD-10-CM

## 2017-06-04 DIAGNOSIS — M6281 Muscle weakness (generalized): Secondary | ICD-10-CM

## 2017-06-04 DIAGNOSIS — R2689 Other abnormalities of gait and mobility: Secondary | ICD-10-CM | POA: Diagnosis not present

## 2017-06-04 NOTE — Therapy (Signed)
[facility] [address] [place], [place], [address] Phone: [phone]   Fax:  [phone]  Physical Therapy Treatment  Patient Details  Name: NONI STONESIFER MRN: [id number] Date of Birth: [birth year] Referring Provider: Helane Rima, DO   Encounter Date: 06/04/2017  PT End of Session - 06/04/17 1500    Visit Number  4    Number of Visits  12    Date for PT Re-Evaluation  07/15/17    Authorization Type  [employer] private insurance    PT Start Time  1105    PT Stop Time  1145    PT Time Calculation (min)  40 min    Activity Tolerance  Patient tolerated treatment well    Behavior During Therapy  [facility] for tasks assessed/performed       Past Medical History:  Diagnosis Date  . Allergic rhinitis due to pollen   . Asthma   . Chronic combined systolic and diastolic CHF (congestive heart failure) (HCC)    06/2016 Echo: EF 40-45%, Gr1 DD  . Depression   . Family history of polycystic kidney with negative CT 11/16/2012  . Fibromyalgia   . GERD (gastroesophageal reflux disease)   . Hypertension   . Hypothyroidism   . IBS (irritable bowel syndrome)   . Internal hemorrhoids   . Morbid obesity (HCC)   . NICM (nonischemic cardiomyopathy) (HCC)    a. 1999 Cath: nl cors;  b. EF prev as low as 35%;  c. 11/2015 Echo: Ef 40-45%;  d. 06/2016 Echo: EF 40-45%;  e. Lexiscan MV: fixed anterosepta/inferseptal defect w/o ischemia, EF 35%.  . Osteoarthritis   . PVC (premature ventricular contraction)   . Restless leg syndrome   . Sicca (HCC) 11/22/2014  . Vertigo     Past Surgical History:  Procedure Laterality Date  . ABDOMINAL HYSTERECTOMY    . CESAREAN SECTION    . CHOLECYSTECTOMY  10-2008  . COLONOSCOPY    . ESOPHAGOGASTRODUODENOSCOPY    . EYE SURGERY     2 2013 and 1981/DCR of right eye 05/2014  . LUMBAR DISC SURGERY  2016   L3-4  . RIGHT/LEFT HEART CATH AND CORONARY ANGIOGRAPHY N/A 07/11/2016   Procedure: Right/Left Heart  Cath and Coronary Angiography;  Surgeon: Peter M Swaziland, MD;  Location: [facility] INVASIVE CV LAB;  Service: Cardiovascular;  Laterality: N/A;  . SHOULDER ARTHROSCOPY W/ SUBACROMIAL DECOMPRESSION AND DISTAL CLAVICLE EXCISION Right   . SPINE SURGERY     C6-C7, 03/2017  . TUBAL LIGATION  1992    There were no vitals filed for this visit.  Subjective Assessment - 06/04/17 1106    Subjective  The patient notes that her nausea worsened after her visit here on Friday.  She reported that she stopped taking a medication (saxenda) and the nausea got better.      Patient Stated Goals  Reduce pain, improve balance, improve posture    Currently in Pain?  Yes    Pain Score  5     Pain Location  -- low back and R groin    Pain Orientation  Right    Pain Descriptors / Indicators  Aching    Pain Type  Chronic pain    Pain Onset  More than a month ago    Pain Frequency  Intermittent    Aggravating Factors   stays about the same; groin is worse today    Pain Relieving Factors  releasing legs on ball  [facility] Adult PT Treatment/Exercise - 06/04/17 1111      Exercises   Exercises  Other Exercises    Other Exercises   Supine resting legs on physioball x 2 minutes.  Seated butterfly to manage groin pain.   After 2 stretches, she notes pain level 2/10.  *Patient is managing her pain level well at home with stretches.  PRONE:  Attempted superman exercise with shoulders overhead with end range flexion * unable to tolerate due to h/o R RTC surgery.  Modified to prone with arms at your side x 10 reps with scapular depression x 10 reps with tactile cues.  Prone "W" x 15 reps with tactile cues.   Also attempted elbow straight with arms abducted 90 deg in prone *too challenging on left with trunk rotation to compensate.  QUADRIPED UE extension x 10 reps alternating with tactile cues.  Hip extension x 10 reps.  STANDING:  wall push up x 10.  Standing THERABAND:  Shoulder rows with arms at 90  degrees.  Shoulder extension with red band *patient compensated with shoulder elevation.              PT Education - 06/04/17 1456    Education provided  Yes    Education Details  added prone "w" scapular retraction, single leg stance    Person(s) Educated  Patient    Methods  Explanation;Demonstration;Handout    Comprehension  Verbalized understanding;Returned demonstration       PT Short Term Goals - 05/16/17 1142      PT SHORT TERM GOAL #1   Title  The patient will be indep with HEP for low back/core stabilization, thoracic extension (when tolerated with recent neck surgery), postural strengthening, LE strengthening and balance.    Time  4    Period  Weeks    Target Date  06/15/17      PT SHORT TERM GOAL #2   Title  The patient will improve LE strength for hip flexion and abduction to 4/5 to demo improving stability for gait/standing activities.    Time  4    Period  Weeks    Target Date  06/15/17      PT SHORT TERM GOAL #3   Title  The patient will improve gait speed from 2.46 to > or equal to 2.8 ft/sec to demo improving functional mobility.    Time  4    Period  Weeks    Target Date  06/15/17      PT SHORT TERM GOAL #4   Title  The patient will be further assessed on balance per Berg or FGA as indicated and LTG to follow.    Time  4    Period  Weeks    Target Date  06/15/17        PT Long Term Goals - 05/16/17 1143      PT LONG TERM GOAL #1   Title  The patient will verbalize understanding of progression of HEP/community wellness for long term participation in exercise.    Time  8    Target Date  07/15/17      PT LONG TERM GOAL #2   Title  The patient will improve gait speed from 2.46 ft/sec to > or equal to 3.0 ft/sec to demo transition to "full community ambulator" classification of gait and demo improved functional mobility.    Time  8    Period  Weeks    Target Date  07/15/17  PT LONG TERM GOAL #3   Title  The patient will report pain in low  back 7/10 at worst.    Baseline  *described greater than 10/10 frequently t/o the week*    Time  8    Period  Weeks    Target Date  07/15/17            Plan - 06/04/17 1502    Clinical Impression Statement  The patient is progressing with strengthening activities.  PT encouraging continued stretching at home to manage chronic pain.    PT Treatment/Interventions  ADLs/Self Care Home Management;Therapeutic exercise;Therapeutic activities;Manual techniques;Patient/family education;Gait training;Functional mobility training;Balance training;Neuromuscular re-education;Vestibular    PT Next Visit Plan  Add strengthening prone scap retraction, theraband, core stabilization activities.  *single leg stance and stair negotiation    Consulted and Agree with Plan of Care  Patient       Patient will benefit from skilled therapeutic intervention in order to improve the following deficits and impairments:  Abnormal gait, Obesity, Decreased strength, Pain, Decreased balance, Difficulty walking, Postural dysfunction  Visit Diagnosis: Other abnormalities of gait and mobility  Muscle weakness (generalized)  Abnormal posture  Unsteadiness on feet     Problem List Patient Active Problem List   Diagnosis Date Noted  . Dizziness 03/01/2017  . Morbid obesity (HCC) 02/24/2017  . Sjogren's disease (HCC) 02/24/2017  . Dense breasts 02/03/2017  . ETD (eustachian tube dysfunction) 01/21/2017  . Fatigue 01/21/2017  . Syncope and collapse   . Failed back syndrome of lumbar spine 12/30/2016  . Chronic low back pain with sciatica 05/02/2015  . Chronic systolic congestive heart failure (HCC) 03/22/2015  . Sicca (HCC) 11/22/2014  . Family history of polycystic kidney 11/16/2012  . Vitamin D deficiency 10/09/2009  . Other and unspecified hyperlipidemia 11/15/2008  . Major depression in complete remission (HCC) 08/31/2008  . Essential hypertension 08/31/2008  . Allergic rhinitis 08/31/2008  .  Asthma 08/31/2008  . GERD 08/31/2008  . Osteoarthritis 08/31/2008  . Hypothyroidism 08/30/2008  . Classical migraine without intractable migraine 08/30/2008  . Primary fibromyalgia syndrome 08/30/2008  . Abnormal Pap smear of cervix 05/07/1995    Scharlene Catalina, PT 06/04/2017, 3:04 PM  [facility] [address] [place], [place], [address] Phone: [phone]   Fax:  [phone]  Name: MORRISSA SHEIN MRN: [id number] Date of Birth: 1960/09/09

## 2017-06-04 NOTE — Telephone Encounter (Signed)
Patient was 10 minutes late for appointment so I called to check to see if she was on her way. Patient stated that she got tied up and was not going to be able to make it. She stated that she was just getting ready to call. I explained to patient that due to not calling to cancel 24 hours in advance she may get a $50 no show fee. Patient wanted to reschedule so I have rescheduled for Friday 06/06/17 at 7:20 AM. I told patient that she needs to be here by 7:10AM  For appointment. Patient stated that she would pay the $50 no show fee on Friday if she needed to.

## 2017-06-04 NOTE — Patient Instructions (Signed)
Chest Flexibility: Shoulder Opener (Doorframe)    Roll shoulders back and down, pressing forward against doorframe. Hold for _10___ breaths. Repeat __3__ times.  Copyright  VHI. All rights reserved.   Horizontal Abduction (Resistive Band)    With arms at shoulder level, keep elbows straight.  CAN DO BOTH ARMS AT THE SAME TIME * PALMS UP *.  Hold __5__ seconds. Repeat _10___ times. Do __1-2__ sessions per day.  Copyright  VHI. All rights reserved.    Extensors, Supine    Lie supine, head on small, rolled towel or one pillow. Gently tuck chin and bring toward chest. Hold __5_ seconds. Repeat __10_ times per session. Do _1-2__ sessions per day.  Copyright  VHI. All rights reserved.  Bridge    Lie back, legs bent, SQUEEZE BALL BETWEEN YOUR KNEES. Inhale, pressing hips up. Keeping ribs in, lengthen lower back. Exhale, rolling down along spine from top. Repeat _10-15___ times. Do __1-2__ sessions per day.  http://pm.exer.us/55  Copyright  VHI. All rights reserved.   Strengthening: Hip Abduction (Side-Lying)    LIE ON YOUR SIDE AND PLACE A FOLDED PILLOW UNDER TOP LEG (AT ANKLE).Tighten muscles on front of left thigh, then lift leg _6___ inches from surface, keeping knee locked.  Repeat __10__ times per set. Do __1__ sets per session. Do __1-2__ sessions per day.  http://orth.exer.us/623  Copyright  VHI. All rights reserved.  Feet Apart, Varied Arm Positions - Eyes Closed    Stand with feet shoulder width apart and arms out. Close eyes and visualize upright position. Hold __30__ seconds. Repeat _3___ times per session. Do _1-2___ sessions per day.  Copyright  VHI. All rights reserved.  Modified Plank   Rise up on elbows.  Push into "plank" position keeping knees on the ground, lifting hips and stomach off of the ground. Hold __10__ seconds. Repeat __5__ times per set. Do _1-2___ sessions per day.  http://orth.exer.us/93   Copyright   VHI. All rights reserved.  Scapular Retraction: Abduction (Prone)    Lie with upper arms straight out from sides, elbows bent to 90. Pinch shoulder blades together and raise arms a few inches from floor. Repeat _10-15___ times per set. Do __1__ sets per session. Do _1-2___ sessions per day.  http://orth.exer.us/957   Copyright  VHI. All rights reserved.   Single Leg - Eyes Open    Holding support, lift right leg while maintaining balance over other leg. Progress to removing hands from support surface for longer periods of time. Hold__10__ seconds. Repeat __3__ times per session. Do _1-2___ sessions per day.  Copyright  VHI. All rights reserved.

## 2017-06-05 NOTE — Telephone Encounter (Signed)
Please update the patient that her account does not reflect the no show for our office just yet, [facility]'s billing department applies the charge and it takes some time. She will receive a statement with the charge once it has been processed and she can then call our office to make the payment, pay at the office or call Billing to pay.   Her account only shows a $50 charge for the hospital account and she can contact billing to pay if she would like.

## 2017-06-06 ENCOUNTER — Encounter: Payer: Self-pay | Admitting: Family Medicine

## 2017-06-06 ENCOUNTER — Ambulatory Visit: Payer: 59 | Admitting: Family Medicine

## 2017-06-06 ENCOUNTER — Ambulatory Visit: Payer: 59 | Attending: Family Medicine | Admitting: Rehabilitative and Restorative Service Providers"

## 2017-06-06 ENCOUNTER — Encounter: Payer: Self-pay | Admitting: Rehabilitative and Restorative Service Providers"

## 2017-06-06 DIAGNOSIS — R2681 Unsteadiness on feet: Secondary | ICD-10-CM | POA: Insufficient documentation

## 2017-06-06 DIAGNOSIS — R293 Abnormal posture: Secondary | ICD-10-CM

## 2017-06-06 DIAGNOSIS — R2689 Other abnormalities of gait and mobility: Secondary | ICD-10-CM

## 2017-06-06 DIAGNOSIS — M6281 Muscle weakness (generalized): Secondary | ICD-10-CM | POA: Insufficient documentation

## 2017-06-06 NOTE — Therapy (Signed)
[facility] [address] [place], [place], [address] Phone: [phone]   Fax:  [phone]  Physical Therapy Treatment  Patient Details  Name: Kristine Curtis MRN: [id number] Date of Birth: 1960/06/29 Referring Provider: Helane Rima, DO   Encounter Date: 06/06/2017  PT End of Session - 06/06/17 1126    Visit Number  5    Number of Visits  12    Date for PT Re-Evaluation  07/15/17    Authorization Type  [employer] private insurance    PT Start Time  1105    PT Stop Time  1145    PT Time Calculation (min)  40 min    Activity Tolerance  Patient tolerated treatment well    Behavior During Therapy  [facility] for tasks assessed/performed       Past Medical History:  Diagnosis Date  . Allergic rhinitis due to pollen   . Asthma   . Chronic combined systolic and diastolic CHF (congestive heart failure) (HCC)    06/2016 Echo: EF 40-45%, Gr1 DD  . Depression   . Family history of polycystic kidney with negative CT 11/16/2012  . Fibromyalgia   . GERD (gastroesophageal reflux disease)   . Hypertension   . Hypothyroidism   . IBS (irritable bowel syndrome)   . Internal hemorrhoids   . Morbid obesity (HCC)   . NICM (nonischemic cardiomyopathy) (HCC)    a. 1999 Cath: nl cors;  b. EF prev as low as 35%;  c. 11/2015 Echo: Ef 40-45%;  d. 06/2016 Echo: EF 40-45%;  e. Lexiscan MV: fixed anterosepta/inferseptal defect w/o ischemia, EF 35%.  . Osteoarthritis   . PVC (premature ventricular contraction)   . Restless leg syndrome   . Sicca (HCC) 11/22/2014  . Vertigo     Past Surgical History:  Procedure Laterality Date  . ABDOMINAL HYSTERECTOMY    . CESAREAN SECTION    . CHOLECYSTECTOMY  10-2008  . COLONOSCOPY    . ESOPHAGOGASTRODUODENOSCOPY    . EYE SURGERY     2 2013 and 1981/DCR of right eye 05/2014  . LUMBAR DISC SURGERY  2016   L3-4  . RIGHT/LEFT HEART CATH AND CORONARY ANGIOGRAPHY N/A 07/11/2016   Procedure: Right/Left Heart Cath  and Coronary Angiography;  Surgeon: Peter M Swaziland, MD;  Location: [facility] INVASIVE CV LAB;  Service: Cardiovascular;  Laterality: N/A;  . SHOULDER ARTHROSCOPY W/ SUBACROMIAL DECOMPRESSION AND DISTAL CLAVICLE EXCISION Right   . SPINE SURGERY     C6-C7, 03/2017  . TUBAL LIGATION  1992    There were no vitals filed for this visit.  Subjective Assessment - 06/06/17 1109    Subjective  The patient feels stiffness throughout her entire body that she attributes to her increased intake of sugar leading ot inflammation.  She feels joints and muscles are stiffness.      Pertinent History  systolic and diastolic heart failure, history of PACs and PVCs by Holter monitor, fibromyalgia, restless leg syndrome, hypertension, depression    Patient Stated Goals  Reduce pain, improve balance, improve posture    Currently in Pain?  Yes    Pain Score  5     Pain Location  -- entire body    Pain Descriptors / Indicators  Aching    Pain Type  Chronic pain    Pain Onset  More than a month ago    Pain Frequency  Intermittent    Aggravating Factors   recent inflammation     Pain Relieving  Factors  meds (oxycodone and diazepam) helped yesterday; used ice/heat                      [facility] Adult PT Treatment/Exercise - 06/06/17 1114      Ambulation/Gait   Ambulation/Gait  Yes    Stairs  Yes    Stairs Assistance  6: Modified independent (Device/Increase time) patient does at slow pace, leaning over railing    Stair Management Technique  One rail Right;Step to pattern    Number of Stairs  4    Gait Comments  gait x 6 minutes working on gentle movement to reduce stiffness (due to recent inflammation).  The patient notes pain is not increased with gentle walking.  Encouraged walking at home for continued movement.      Neuro Re-ed    Neuro Re-ed Details   single leg stance activities working on maintaining level pelvic height with tactile cues; rocker board standing rolling ball up/down wall engaging  core; sidestepping with posture re-education.       Exercises   Exercises  Other Exercises    Other Exercises   Step ups to 4" step with UE support x 10 reps each side; Sitting on physioball with marching working on maintaining core stability with min A, LE extension on ball emphasizing pelvic level with core stability.  Rolling ball up/down a wall to engage core during standing tasks.  Worked on maintaining pelvic neutral position in standing *patient leans chest posterior to hips to decrease activation of core, especially during balance challenges.                PT Short Term Goals - 05/16/17 1142      PT SHORT TERM GOAL #1   Title  The patient will be indep with HEP for low back/core stabilization, thoracic extension (when tolerated with recent neck surgery), postural strengthening, LE strengthening and balance.    Time  4    Period  Weeks    Target Date  06/15/17      PT SHORT TERM GOAL #2   Title  The patient will improve LE strength for hip flexion and abduction to 4/5 to demo improving stability for gait/standing activities.    Time  4    Period  Weeks    Target Date  06/15/17      PT SHORT TERM GOAL #3   Title  The patient will improve gait speed from 2.46 to > or equal to 2.8 ft/sec to demo improving functional mobility.    Time  4    Period  Weeks    Target Date  06/15/17      PT SHORT TERM GOAL #4   Title  The patient will be further assessed on balance per Berg or FGA as indicated and LTG to follow.    Time  4    Period  Weeks    Target Date  06/15/17        PT Long Term Goals - 05/16/17 1143      PT LONG TERM GOAL #1   Title  The patient will verbalize understanding of progression of HEP/community wellness for long term participation in exercise.    Time  8    Target Date  07/15/17      PT LONG TERM GOAL #2   Title  The patient will improve gait speed from 2.46 ft/sec to > or equal to 3.0 ft/sec to demo transition to "full community ambulator"  classification of gait and demo improved functional mobility.    Time  8    Period  Weeks    Target Date  07/15/17      PT LONG TERM GOAL #3   Title  The patient will report pain in low back 7/10 at worst.    Baseline  *described greater than 10/10 frequently t/o the week*    Time  8    Period  Weeks    Target Date  07/15/17            Plan - 06/06/17 1637    Clinical Impression Statement  The patient had a flare up of inflammation this week per report.  PT encouraged slow, gentle movements to tolerance during these episodes to continue to maintain strength/mobility.  PT worked on balance, core activities, strength and postural education.  Continue working to [employer].     PT Treatment/Interventions  ADLs/Self Care Home Management;Therapeutic exercise;Therapeutic activities;Manual techniques;Patient/family education;Gait training;Functional mobility training;Balance training;Neuromuscular re-education;Vestibular    PT Next Visit Plan  Add strengthening prone scap retraction, theraband, core stabilization activities.  *single leg stance and stair negotiation    Consulted and Agree with Plan of Care  Patient       Patient will benefit from skilled therapeutic intervention in order to improve the following deficits and impairments:  Abnormal gait, Obesity, Decreased strength, Pain, Decreased balance, Difficulty walking, Postural dysfunction  Visit Diagnosis: Other abnormalities of gait and mobility  Muscle weakness (generalized)  Abnormal posture  Unsteadiness on feet     Problem List Patient Active Problem List   Diagnosis Date Noted  . Dizziness 03/01/2017  . Morbid obesity (HCC) 02/24/2017  . Sjogren's disease (HCC) 02/24/2017  . Dense breasts 02/03/2017  . ETD (eustachian tube dysfunction) 01/21/2017  . Fatigue 01/21/2017  . Syncope and collapse   . Failed back syndrome of lumbar spine 12/30/2016  . Chronic low back pain with sciatica 05/02/2015  . Chronic systolic  congestive heart failure (HCC) 03/22/2015  . Sicca (HCC) 11/22/2014  . Family history of polycystic kidney 11/16/2012  . Vitamin D deficiency 10/09/2009  . Other and unspecified hyperlipidemia 11/15/2008  . Major depression in complete remission (HCC) 08/31/2008  . Essential hypertension 08/31/2008  . Allergic rhinitis 08/31/2008  . Asthma 08/31/2008  . GERD 08/31/2008  . Osteoarthritis 08/31/2008  . Hypothyroidism 08/30/2008  . Classical migraine without intractable migraine 08/30/2008  . Primary fibromyalgia syndrome 08/30/2008  . Abnormal Pap smear of cervix 05/07/1995    Virginia Curl, PT 06/06/2017, 4:39 PM  [place] [facility] [address] [place], [place], [address] Phone: [phone]   Fax:  [phone]  Name: JAMYIAH LABELLA MRN: [id number] Date of Birth: 11/07/1960

## 2017-06-09 ENCOUNTER — Encounter: Payer: Self-pay | Admitting: Rehabilitative and Restorative Service Providers"

## 2017-06-09 ENCOUNTER — Ambulatory Visit: Payer: 59 | Admitting: Family Medicine

## 2017-06-09 ENCOUNTER — Ambulatory Visit: Payer: 59 | Admitting: Rehabilitative and Restorative Service Providers"

## 2017-06-09 ENCOUNTER — Encounter: Payer: Self-pay | Admitting: Family Medicine

## 2017-06-09 VITALS — BP 126/88 | HR 98 | Temp 98.6°F | Wt 231.6 lb

## 2017-06-09 DIAGNOSIS — M5412 Radiculopathy, cervical region: Secondary | ICD-10-CM | POA: Diagnosis not present

## 2017-06-09 DIAGNOSIS — I428 Other cardiomyopathies: Secondary | ICD-10-CM

## 2017-06-09 DIAGNOSIS — G8929 Other chronic pain: Secondary | ICD-10-CM | POA: Diagnosis not present

## 2017-06-09 DIAGNOSIS — R2681 Unsteadiness on feet: Secondary | ICD-10-CM

## 2017-06-09 DIAGNOSIS — R2689 Other abnormalities of gait and mobility: Secondary | ICD-10-CM | POA: Diagnosis not present

## 2017-06-09 DIAGNOSIS — R293 Abnormal posture: Secondary | ICD-10-CM

## 2017-06-09 DIAGNOSIS — M6281 Muscle weakness (generalized): Secondary | ICD-10-CM

## 2017-06-09 DIAGNOSIS — M5441 Lumbago with sciatica, right side: Secondary | ICD-10-CM

## 2017-06-09 DIAGNOSIS — F325 Major depressive disorder, single episode, in full remission: Secondary | ICD-10-CM

## 2017-06-09 MED ORDER — TOPIRAMATE 25 MG PO TABS: 25.0000 mg | ORAL_TABLET | Freq: Two times a day (BID) | ORAL | 3 refills | Status: DC

## 2017-06-09 MED ORDER — NYSTATIN 100000 UNIT/GM EX OINT: 1.0000 "application " | TOPICAL_OINTMENT | Freq: Two times a day (BID) | CUTANEOUS | 0 refills | Status: DC

## 2017-06-09 MED ORDER — OXYCODONE-ACETAMINOPHEN 5-325 MG PO TABS: 1.0000 | ORAL_TABLET | Freq: Three times a day (TID) | ORAL | 0 refills | Status: DC | PRN

## 2017-06-09 MED ORDER — TRIAMCINOLONE ACETONIDE 0.5 % EX OINT: 1.0000 "application " | TOPICAL_OINTMENT | Freq: Two times a day (BID) | CUTANEOUS | 0 refills | Status: DC

## 2017-06-09 NOTE — Progress Notes (Signed)
Kristine Curtis is a 57 y.o. female is here for follow up.  History of Present Illness:   HPI: See Assessment and Plan section for Problem Based Charting of issues discussed today.  Health Maintenance Due  Topic Date Due  . PNEUMOCOCCAL POLYSACCHARIDE VACCINE (1) 02/21/1962  . FOOT EXAM  02/21/1970  . OPHTHALMOLOGY EXAM  02/21/1970  . HEMOGLOBIN A1C  09/17/2016   Depression screen PHQ 2/9 06/09/2017  Decreased Interest 0  Down, Depressed, Hopeless 0  PHQ - 2 Score 0  Altered sleeping 3  Tired, decreased energy 2  Change in appetite 0  Feeling bad or failure about yourself  0  Trouble concentrating 3  Moving slowly or fidgety/restless 0  Suicidal thoughts 0  PHQ-9 Score 8   PMHx, SurgHx, SocialHx, FamHx, Medications, and Allergies were reviewed in the Visit Navigator and updated as appropriate.   Patient Active Problem List   Diagnosis Date Noted  . Dizziness 03/01/2017  . Morbid obesity (HCC) 02/24/2017  . Sjogren's disease (HCC) 02/24/2017  . Dense breasts 02/03/2017  . ETD (eustachian tube dysfunction) 01/21/2017  . Fatigue 01/21/2017  . Syncope and collapse   . Failed back syndrome of lumbar spine 12/30/2016  . Chronic low back pain with sciatica 05/02/2015  . Chronic systolic congestive heart failure (HCC) 03/22/2015  . Sicca (HCC) 11/22/2014  . Family history of polycystic kidney 11/16/2012  . Vitamin D deficiency 10/09/2009  . Other and unspecified hyperlipidemia 11/15/2008  . Major depression in complete remission (HCC) 08/31/2008  . Essential hypertension 08/31/2008  . Allergic rhinitis 08/31/2008  . Asthma 08/31/2008  . GERD 08/31/2008  . Osteoarthritis 08/31/2008  . Hypothyroidism 08/30/2008  . Classical migraine without intractable migraine 08/30/2008  . Primary fibromyalgia syndrome 08/30/2008  . Abnormal Pap smear of cervix 05/07/1995   Social History   Tobacco Use  . Smoking status: Former Smoker    Packs/day: 2.00    Years: 12.00    Pack  years: 24.00    Types: Cigarettes    Last attempt to quit: 05/06/1989    Years since quitting: 28.1  . Smokeless tobacco: Never Used  . Tobacco comment: quit smoking 1990  Substance Use Topics  . Alcohol use: No    Alcohol/week: 0.0 oz  . Drug use: No   Current Medications and Allergies:    .  buPROPion (WELLBUTRIN XL) 300 MG 24 hr tablet, TAKE 1 TABLET BY MOUTH DAILY, Disp: 30 tablet, Rfl: 11 .  carvedilol (COREG) 25 MG tablet, Take 1/2 tablet ( 12.5 mg ) twice a day, Disp: 60 tablet, Rfl: 6 .  Cholecalciferol (VITAMIN D-3) 5000 UNITS TABS, Take 5,000 Units by mouth daily. , Disp: , Rfl:  .  diazepam (VALIUM) 5 MG tablet, TAKE ONE (1) TABLET BY MOUTH EVERY SIX HOURS AS NEEDED, Disp: 30 tablet, Rfl: 3 .  fluticasone (FLONASE) 50 MCG/ACT nasal spray, Place 1 spray daily into both nostrils., Disp: 16 g, Rfl: 3 .  furosemide (LASIX) 20 MG tablet, TAKE 1 TABLET BY MOUTH DAILY, Disp: 90 tablet, Rfl: 2 .  hydroxychloroquine (PLAQUENIL) 200 MG tablet, Take 400 mg by mouth every morning. , Disp: , Rfl:  .  levothyroxine (SYNTHROID, LEVOTHROID) 88 MCG tablet, TAKE 1 TABLET BY MOUTH DAILY, Disp: 30 tablet, Rfl: 2 .  losartan (COZAAR) 50 MG tablet, Take 50 mg by mouth daily., Disp: , Rfl:  .  meloxicam (MOBIC) 7.5 MG tablet, TAKE 1 TABLET BY MOUTH DAILY, Disp: 30 tablet, Rfl: 3 .  mometasone (ASMANEX) 220 MCG/INH inhaler, Inhale 2 puffs daily into the lungs., Disp: , Rfl:  .  omeprazole (PRILOSEC) 40 MG capsule, TAKE ONE (1) CAPSULE BY MOUTH  BEFORE BREAKFAST, Disp: 30 capsule, Rfl: 1 .  oxyCODONE-acetaminophen (PERCOCET/ROXICET) 5-325 MG tablet, Take 1 tablet every 8 (eight) hours as needed by mouth for severe pain., Disp: 60 tablet, Rfl: 0 .  spironolactone (ALDACTONE) 25 MG tablet, Take 1 tablet (25 mg total) by mouth daily., Disp: 90 tablet, Rfl: 3 .  XIIDRA 5 % SOLN, Place 1 drop into both eyes daily as needed (dry eyes). , Disp: , Rfl:    Allergies  Allergen Reactions  . Gabapentin  (Once-Daily)     Depression   . Sulfa Antibiotics     itching  . Adhesive [Tape] Rash  . Codeine Itching   Review of Systems   Pertinent items are noted in the HPI. Otherwise, ROS is negative.  Vitals:   Vitals:   06/09/17 0926  BP: 126/88  Pulse: 98  Temp: 98.6 F (37 C)  TempSrc: Oral  SpO2: 98%  Weight: 231 lb 9.6 oz (105.1 kg)     Body mass index is 41.03 kg/m.  Physical Exam:   Physical Exam  Constitutional: She appears well-nourished.  HENT:  Head: Normocephalic and atraumatic.  Eyes: EOM are normal. Pupils are equal, round, and reactive to light.  Neck: Normal range of motion. Neck supple.  Cardiovascular: Normal rate, regular rhythm, normal heart sounds and intact distal pulses.  Pulmonary/Chest: Effort normal.  Abdominal: Soft.  Skin: Skin is warm.  Psychiatric: She has a normal mood and affect. Her behavior is normal.  Nursing note and vitals reviewed.   Assessment and Plan:   1. Chronic right-sided low back pain with right-sided sciatica Ongoing.  MRI 2016:  Disc bulge with a superimposed right lateral recess protrusion at L3-4 impinges on the descending right L4 root and deforms right aspect of the thecal sac. Shallow disc bulge at L4-5 results in narrowing in the lateral recesses which could impact either descending L5 root.  Reviewed pain control and risks versus benefits of below medications. She used them prn and sparingly. No previous issues. Database reviewed.  - oxyCODONE-acetaminophen (PERCOCET/ROXICET) 5-325 MG tablet; Take 1 tablet by mouth every 8 (eight) hours as needed for severe pain.  Dispense: 60 tablet; Refill: 0  2. Chronic radicular cervical pain Ongoing. Upcoming surgery.  - oxyCODONE-acetaminophen (PERCOCET/ROXICET) 5-325 MG tablet; Take 1 tablet by mouth every 8 (eight) hours as needed for severe pain.  Dispense: 60 tablet; Refill: 0  3. NICM (nonischemic cardiomyopathy) (HCC) Stable.  4. Major depression in complete  remission (HCC) Well controlled.  No signs of complications, medication side effects, or red flags.  Continue current regimen. Looking into getting a counselor.  5. Morbid obesity (HCC) The patient is asked to make an attempt to improve diet and exercise patterns to aid in medical management of this problem.    . Reviewed expectations re: course of current medical issues. . Discussed self-management of symptoms. . Outlined signs and symptoms indicating need for more acute intervention. . Patient verbalized understanding and all questions were answered. Marland Kitchen Health Maintenance issues including appropriate healthy diet, exercise, and smoking avoidance were discussed with patient. . See orders for this visit as documented in the electronic medical record. . Patient received an After Visit Summary.  Helane Rima, DO [place], [place] [facility] 06/14/2017

## 2017-06-09 NOTE — Therapy (Signed)
[facility] [address] [place], [place], [address] Phone: [phone]   Fax:  [phone]  Physical Therapy Treatment  Patient Details  Name: Kristine Curtis MRN: [id number] Date of Birth: 1961-03-15 Referring Provider: Helane Rima, DO   Encounter Date: 06/09/2017  PT End of Session - 06/09/17 1512    Visit Number  6    Number of Visits  12    Date for PT Re-Evaluation  07/15/17    Authorization Type  [employer] private insurance    PT Start Time  1411    PT Stop Time  1450    PT Time Calculation (min)  39 min    Activity Tolerance  Patient tolerated treatment well    Behavior During Therapy  [facility] for tasks assessed/performed       Past Medical History:  Diagnosis Date  . Allergic rhinitis due to pollen   . Asthma   . Chronic combined systolic and diastolic CHF (congestive heart failure) (HCC)    06/2016 Echo: EF 40-45%, Gr1 DD  . Depression   . Family history of polycystic kidney with negative CT 11/16/2012  . Fibromyalgia   . GERD (gastroesophageal reflux disease)   . Hypertension   . Hypothyroidism   . IBS (irritable bowel syndrome)   . Internal hemorrhoids   . Morbid obesity (HCC)   . NICM (nonischemic cardiomyopathy) (HCC)    a. 1999 Cath: nl cors;  b. EF prev as low as 35%;  c. 11/2015 Echo: Ef 40-45%;  d. 06/2016 Echo: EF 40-45%;  e. Lexiscan MV: fixed anterosepta/inferseptal defect w/o ischemia, EF 35%.  . Osteoarthritis   . PVC (premature ventricular contraction)   . Restless leg syndrome   . Sicca (HCC) 11/22/2014  . Vertigo     Past Surgical History:  Procedure Laterality Date  . ABDOMINAL HYSTERECTOMY    . CESAREAN SECTION    . CHOLECYSTECTOMY  10-2008  . COLONOSCOPY    . ESOPHAGOGASTRODUODENOSCOPY    . EYE SURGERY     2 2013 and 1981/DCR of right eye 05/2014  . LUMBAR DISC SURGERY  2016   L3-4  . RIGHT/LEFT HEART CATH AND CORONARY ANGIOGRAPHY N/A 07/11/2016   Procedure: Right/Left Heart Cath  and Coronary Angiography;  Surgeon: Peter M Swaziland, MD;  Location: [facility] INVASIVE CV LAB;  Service: Cardiovascular;  Laterality: N/A;  . SHOULDER ARTHROSCOPY W/ SUBACROMIAL DECOMPRESSION AND DISTAL CLAVICLE EXCISION Right   . SPINE SURGERY     C6-C7, 03/2017  . TUBAL LIGATION  1992    There were no vitals filed for this visit.  Subjective Assessment - 06/09/17 1413    Subjective  The patient feels that inflammation is about 75% better.  She saw her primary care doctor today about recent med changes and has a plan for weight mgmt.    The patient worked on sitting on ball at home near support and with husband near.     Pertinent History  systolic and diastolic heart failure, history of PACs and PVCs by Holter monitor, fibromyalgia, restless leg syndrome, hypertension, depression    Patient Stated Goals  Reduce pain, improve balance, improve posture    Currently in Pain?  Yes    Pain Score  2  "carrying a little bit of tension" in neck; 2/10 in back    Pain Location  -- neck and back    Pain Descriptors / Indicators  Aching    Pain Type  Chronic pain    Pain  Onset  More than a month ago    Pain Frequency  Intermittent    Aggravating Factors   recent inflammation    Pain Relieving Factors  meds; stretching         [facility] PT Assessment - 06/09/17 1449      Ambulation/Gait   Ambulation/Gait Assistance  6: Modified independent (Device/Increase time)    Ambulation Distance (Feet)  400 Feet    Ambulation Surface  Level;Indoor    Stairs Assistance  6: Modified independent (Device/Increase time)    Stair Management Technique  Two rails;Alternating pattern    Number of Stairs  8    Gait Comments  Gait activities to increase arm swing and stride while engaging core musculature.                  [facility] Adult PT Treatment/Exercise - 06/09/17 1449      Ambulation/Gait   Ambulation/Gait  Yes    Stairs  Yes      Self-Care   Self-Care  Other Self-Care Comments    Other Self-Care  Comments   PT and patient discussed initiating pool walking for increasing physical activity slowly without added strain to knees and spine due to h/o chronic pain.       Exercises   Exercises  Other Exercises    Other Exercises   Step ups to 4" surface x 5 reps R and L sides.  "Up/up, down/down" x 5 reps with each leg leading.  PHYSIOBALL sitting working on core contraction while performing marching, LE extension.  Postural training near mirror for cues.  UE STRENGTHENING:  Standing using 2 lb weights performed: elbow flexion x 10 reps each side, shoulder flexion x 10 reps bilat, attempted abduction of shoulders (shoulder elevation occurs, therefore did not perform).  Switched to 1 lb weight left for scaption to 90 degrees and remained at 2 lbs with right side. Shoulder press with 2 lb weights x 10 reps bilaterally.       Neck Exercises: Machines for Strengthening   UBE (Upper Arm Bike)  2 minutes forward at level 3 and 1 minute backwards at level 3             PT Education - 06/09/17 2006    Education provided  Yes    Education Details  Recommended patient add pool 1x/week to current routine.  She has increased walking and is compliant with HEP.    Person(s) Educated  Patient    Methods  Explanation    Comprehension  Verbalized understanding       PT Short Term Goals - 05/16/17 1142      PT SHORT TERM GOAL #1   Title  The patient will be indep with HEP for low back/core stabilization, thoracic extension (when tolerated with recent neck surgery), postural strengthening, LE strengthening and balance.    Time  4    Period  Weeks    Target Date  06/15/17      PT SHORT TERM GOAL #2   Title  The patient will improve LE strength for hip flexion and abduction to 4/5 to demo improving stability for gait/standing activities.    Time  4    Period  Weeks    Target Date  06/15/17      PT SHORT TERM GOAL #3   Title  The patient will improve gait speed from 2.46 to > or equal to 2.8  ft/sec to demo improving functional mobility.    Time  4    Period  Weeks    Target Date  06/15/17      PT SHORT TERM GOAL #4   Title  The patient will be further assessed on balance per Berg or FGA as indicated and LTG to follow.    Time  4    Period  Weeks    Target Date  06/15/17        PT Long Term Goals - 05/16/17 1143      PT LONG TERM GOAL #1   Title  The patient will verbalize understanding of progression of HEP/community wellness for long term participation in exercise.    Time  8    Target Date  07/15/17      PT LONG TERM GOAL #2   Title  The patient will improve gait speed from 2.46 ft/sec to > or equal to 3.0 ft/sec to demo transition to "full community ambulator" classification of gait and demo improved functional mobility.    Time  8    Period  Weeks    Target Date  07/15/17      PT LONG TERM GOAL #3   Title  The patient will report pain in low back 7/10 at worst.    Baseline  *described greater than 10/10 frequently t/o the week*    Time  8    Period  Weeks    Target Date  07/15/17            Plan - 06/09/17 1516    Clinical Impression Statement  The patient tolerated upper body and core strengthening welll today.  She has marked improvement in ability to negotiate stairs today.   Patient is adhering to recommended home program and PT slowly increasing routine to tolerance.     PT Treatment/Interventions  ADLs/Self Care Home Management;Therapeutic exercise;Therapeutic activities;Manual techniques;Patient/family education;Gait training;Functional mobility training;Balance training;Neuromuscular re-education;Vestibular    PT Next Visit Plan  Add strengthening prone scap retraction, theraband, core stabilization activities.  *single leg stance and stair negotiation    Consulted and Agree with Plan of Care  Patient       Patient will benefit from skilled therapeutic intervention in order to improve the following deficits and impairments:  Abnormal gait,  Obesity, Decreased strength, Pain, Decreased balance, Difficulty walking, Postural dysfunction  Visit Diagnosis: Other abnormalities of gait and mobility  Muscle weakness (generalized)  Abnormal posture  Unsteadiness on feet     Problem List Patient Active Problem List   Diagnosis Date Noted  . Dizziness 03/01/2017  . Morbid obesity (HCC) 02/24/2017  . Sjogren's disease (HCC) 02/24/2017  . Dense breasts 02/03/2017  . ETD (eustachian tube dysfunction) 01/21/2017  . Fatigue 01/21/2017  . Syncope and collapse   . Failed back syndrome of lumbar spine 12/30/2016  . Chronic low back pain with sciatica 05/02/2015  . Chronic systolic congestive heart failure (HCC) 03/22/2015  . Sicca (HCC) 11/22/2014  . Family history of polycystic kidney 11/16/2012  . Vitamin D deficiency 10/09/2009  . Other and unspecified hyperlipidemia 11/15/2008  . Major depression in complete remission (HCC) 08/31/2008  . Essential hypertension 08/31/2008  . Allergic rhinitis 08/31/2008  . Asthma 08/31/2008  . GERD 08/31/2008  . Osteoarthritis 08/31/2008  . Hypothyroidism 08/30/2008  . Classical migraine without intractable migraine 08/30/2008  . Primary fibromyalgia syndrome 08/30/2008  . Abnormal Pap smear of cervix 05/07/1995    Paulyne Mooty, PT 06/09/2017, 8:12 PM  [facility] [address] [place], [place], [address]  Phone: [phone]   Fax:  [phone]  Name: SHADE RIVENBARK MRN: [id number] Date of Birth: 1960-09-10

## 2017-06-12 ENCOUNTER — Ambulatory Visit: Payer: 59 | Admitting: Rehabilitative and Restorative Service Providers"

## 2017-06-16 ENCOUNTER — Encounter: Payer: Self-pay | Admitting: Family Medicine

## 2017-06-18 ENCOUNTER — Other Ambulatory Visit: Payer: Self-pay

## 2017-06-18 MED ORDER — MOMETASONE FUROATE 220 MCG/INH IN AEPB: 2.0000 | INHALATION_SPRAY | Freq: Every day | RESPIRATORY_TRACT | 0 refills | Status: DC

## 2017-06-18 NOTE — Telephone Encounter (Signed)
Per Dr Earlene Plater ok original inhaler requested sent in for patient.

## 2017-06-23 ENCOUNTER — Ambulatory Visit: Payer: 59 | Admitting: Family Medicine

## 2017-06-26 ENCOUNTER — Ambulatory Visit: Payer: 59 | Admitting: Rehabilitative and Restorative Service Providers"

## 2017-06-26 ENCOUNTER — Encounter: Payer: Self-pay | Admitting: Rehabilitative and Restorative Service Providers"

## 2017-06-26 DIAGNOSIS — R2689 Other abnormalities of gait and mobility: Secondary | ICD-10-CM

## 2017-06-26 DIAGNOSIS — M6281 Muscle weakness (generalized): Secondary | ICD-10-CM

## 2017-06-26 DIAGNOSIS — R2681 Unsteadiness on feet: Secondary | ICD-10-CM

## 2017-06-26 DIAGNOSIS — R293 Abnormal posture: Secondary | ICD-10-CM

## 2017-06-26 NOTE — Therapy (Signed)
[facility] [address] [place], [place], [address] Phone: [phone]   Fax:  [phone]  Physical Therapy Treatment  Patient Details  Name: Kristine Curtis MRN: [id number] Date of Birth: 02-09-1961 Referring Provider: Helane Rima, DO   Encounter Date: 06/26/2017  PT End of Session - 06/26/17 1416    Visit Number  7    Number of Visits  12    Date for PT Re-Evaluation  07/15/17    Authorization Type  [employer] private insurance    PT Start Time  1320    PT Stop Time  1410    PT Time Calculation (min)  50 min    Activity Tolerance  Patient limited by pain    Behavior During Therapy  [facility] for tasks assessed/performed       Past Medical History:  Diagnosis Date  . Allergic rhinitis due to pollen   . Asthma   . Chronic combined systolic and diastolic CHF (congestive heart failure) (HCC)    06/2016 Echo: EF 40-45%, Gr1 DD  . Depression   . Family history of polycystic kidney with negative CT 11/16/2012  . Fibromyalgia   . GERD (gastroesophageal reflux disease)   . Hypertension   . Hypothyroidism   . IBS (irritable bowel syndrome)   . Internal hemorrhoids   . Morbid obesity (HCC)   . NICM (nonischemic cardiomyopathy) (HCC)    a. 1999 Cath: nl cors;  b. EF prev as low as 35%;  c. 11/2015 Echo: Ef 40-45%;  d. 06/2016 Echo: EF 40-45%;  e. Lexiscan MV: fixed anterosepta/inferseptal defect w/o ischemia, EF 35%.  . Osteoarthritis   . PVC (premature ventricular contraction)   . Restless leg syndrome   . Sicca (HCC) 11/22/2014  . Vertigo     Past Surgical History:  Procedure Laterality Date  . ABDOMINAL HYSTERECTOMY    . CESAREAN SECTION    . CHOLECYSTECTOMY  10-2008  . COLONOSCOPY    . ESOPHAGOGASTRODUODENOSCOPY    . EYE SURGERY     2 2013 and 1981/DCR of right eye 05/2014  . LUMBAR DISC SURGERY  2016   L3-4  . RIGHT/LEFT HEART CATH AND CORONARY ANGIOGRAPHY N/A 07/11/2016   Procedure: Right/Left Heart Cath and  Coronary Angiography;  Surgeon: Peter M Swaziland, MD;  Location: [facility] INVASIVE CV LAB;  Service: Cardiovascular;  Laterality: N/A;  . SHOULDER ARTHROSCOPY W/ SUBACROMIAL DECOMPRESSION AND DISTAL CLAVICLE EXCISION Right   . SPINE SURGERY     C6-C7, 03/2017  . TUBAL LIGATION  1992    There were no vitals filed for this visit.  Subjective Assessment - 06/26/17 1325    Subjective  The patient side stepped off of her porch wrong on 2/6 and aggravated her low back noting pain that radiates into bilateral groin region.  She notes that walking, stretching and pain medication do not help.  She feels like this has kicked off headaches, depression, and anxiety.  She reports tightness has gotten worse "everywhere".  She initially tried more pain meds, but it didn't help.  The 25th anniversary of her mother passing away was over the past weekend and she notes this has also contributed to her depression this week.     Pertinent History  systolic and diastolic heart failure, history of PACs and PVCs by Holter monitor, fibromyalgia, restless leg syndrome, hypertension, depression    Patient Stated Goals  Reduce pain, improve balance, improve posture    Currently in Pain?  Yes  Pain Score  7     Pain Location  Back    Pain Orientation  Lower    Pain Descriptors / Indicators  Shooting    Pain Radiating Towards  radiates into bilateral groin regions    Pain Onset  1 to 4 weeks ago    Pain Frequency  Constant    Aggravating Factors   recent side stepping off stairs has increased pain-- acute injury "excruciating pain that comes and stays.  I feel like my entire body is on fire."    Pain Relieving Factors  nothing    Multiple Pain Sites  Yes    Pain Score  0    Pain Location  Neck    Pain Descriptors / Indicators  Discomfort;Aching    Pain Type  Acute pain    Pain Radiating Towards  Can get neck pain intermittent- has happened twice- she describes it as pain wrapping around her arms and body.    Pain Onset   More than a month ago    Pain Frequency  Intermittent    Aggravating Factors   intermittent- unsure of what provokes     Pain Relieving Factors  unsure         [facility] PT Assessment - 06/26/17 1337      Palpation   Palpation comment  Significant pain with muscle guarding lateral border of R sacrum along long posterior SI ligament and surrounding musculature.         [facility] Adult PT Treatment/Exercise - 06/26/17 1337      Ambulation/Gait   Ambulation/Gait  Yes    Ambulation/Gait Assistance  6: Modified independent (Device/Increase time) slowed pace    Ambulation Distance (Feet)  200 Feet    Ambulation Surface  Level;Indoor    Gait Comments  After doing ther ex, the patient tolerated walking without increase in pain x 200 ft.      Exercises   Exercises  Other Exercises    Other Exercises   SUPINE:  R and L LE hip distraction x 2 reps each side, passive hamstring stretch with limitations noted initially in R leg as compared to L.  PT performed neural gliding R and L LEs and then performed passive hamstring stretching noting improved ROM R after neural gliding.  Gentle lumbar rocking supine.  Passive hip IR/ER with some discomfort R SI region.  (after prone) Bridging x 2 reps with isometric holds hip ab/adductors for co-contraction of pelvic musculature.  PRONE:  lying on stomach x 2 minutes.  Prone on elbows x  2 minutes.  Passive hip IR/ER with knee flexed to 90.        Manual Therapy   Manual Therapy  Muscle Energy Technique    Manual therapy comments  Patient notes pain reduced after ther ex, neural gliding, and muscle energy techniques.    Muscle Energy Technique  Contract relax of R SI musculature using hamstring contraction R + hip flexor contraction L to reduce pain.               PT Short Term Goals - 05/16/17 1142      PT SHORT TERM GOAL #1   Title  The patient will be indep with HEP for low back/core stabilization, thoracic extension (when tolerated with recent neck  surgery), postural strengthening, LE strengthening and balance.    Time  4    Period  Weeks    Target Date  06/15/17      PT SHORT TERM GOAL #  2   Title  The patient will improve LE strength for hip flexion and abduction to 4/5 to demo improving stability for gait/standing activities.    Time  4    Period  Weeks    Target Date  06/15/17      PT SHORT TERM GOAL #3   Title  The patient will improve gait speed from 2.46 to > or equal to 2.8 ft/sec to demo improving functional mobility.    Time  4    Period  Weeks    Target Date  06/15/17      PT SHORT TERM GOAL #4   Title  The patient will be further assessed on balance per Berg or FGA as indicated and LTG to follow.    Time  4    Period  Weeks    Target Date  06/15/17        PT Long Term Goals - 05/16/17 1143      PT LONG TERM GOAL #1   Title  The patient will verbalize understanding of progression of HEP/community wellness for long term participation in exercise.    Time  8    Target Date  07/15/17      PT LONG TERM GOAL #2   Title  The patient will improve gait speed from 2.46 ft/sec to > or equal to 3.0 ft/sec to demo transition to "full community ambulator" classification of gait and demo improved functional mobility.    Time  8    Period  Weeks    Target Date  07/15/17      PT LONG TERM GOAL #3   Title  The patient will report pain in low back 7/10 at worst.    Baseline  *described greater than 10/10 frequently t/o the week*    Time  8    Period  Weeks    Target Date  07/15/17            Plan - 06/26/17 1428    Clinical Impression Statement  The patient has not been seen since 06/09/2017 due to increased pain throughout lumbar spine with other pain radiating into hips and groin and intermittent neck pain.  She feels that increased pain has kicked off an autoimmune repsonse as well as increased depression/anxiety.  PT and patient discussed proceeding wiht gentle exercise to her tolerance.  Prior goals established  are still appropriate, however PT will check STGs within next week due to acute flare up of low back pain.  TODAY's SESSION:  Pain decreased from 7/10 down to 5/10 with no increase in pain with short distance walking by end of session.      PT Treatment/Interventions  ADLs/Self Care Home Management;Therapeutic exercise;Therapeutic activities;Manual techniques;Patient/family education;Gait training;Functional mobility training;Balance training;Neuromuscular re-education;Vestibular    PT Next Visit Plan  R sacral musculature soft tissue mobilization, prone positioning, balance when able to tolerate, core engagement/stabilization activities.     Consulted and Agree with Plan of Care  Patient       Patient will benefit from skilled therapeutic intervention in order to improve the following deficits and impairments:  Abnormal gait, Obesity, Decreased strength, Pain, Decreased balance, Difficulty walking, Postural dysfunction  Visit Diagnosis: Other abnormalities of gait and mobility  Muscle weakness (generalized)  Abnormal posture  Unsteadiness on feet     Problem List Patient Active Problem List   Diagnosis Date Noted  . Dizziness 03/01/2017  . Morbid obesity (HCC) 02/24/2017  . Sjogren's disease (HCC) 02/24/2017  . Dense  breasts 02/03/2017  . ETD (eustachian tube dysfunction) 01/21/2017  . Fatigue 01/21/2017  . Syncope and collapse   . Failed back syndrome of lumbar spine 12/30/2016  . Chronic low back pain with sciatica 05/02/2015  . Chronic systolic congestive heart failure (HCC) 03/22/2015  . Sicca (HCC) 11/22/2014  . Family history of polycystic kidney 11/16/2012  . Vitamin D deficiency 10/09/2009  . Other and unspecified hyperlipidemia 11/15/2008  . Major depression in complete remission (HCC) 08/31/2008  . Essential hypertension 08/31/2008  . Allergic rhinitis 08/31/2008  . Asthma 08/31/2008  . GERD 08/31/2008  . Osteoarthritis 08/31/2008  . Hypothyroidism 08/30/2008   . Classical migraine without intractable migraine 08/30/2008  . Primary fibromyalgia syndrome 08/30/2008  . Abnormal Pap smear of cervix 05/07/1995    Michole Lecuyer, PT 06/26/2017, 2:31 PM  [facility] [address] [place], [place], [address] Phone: [phone]   Fax:  [phone]  Name: GABRIELLAH RABEL MRN: [id number] Date of Birth: 11-07-1960

## 2017-06-27 ENCOUNTER — Ambulatory Visit: Payer: 59 | Admitting: Rehabilitative and Restorative Service Providers"

## 2017-06-27 ENCOUNTER — Encounter: Payer: Self-pay | Admitting: Rehabilitative and Restorative Service Providers"

## 2017-06-27 DIAGNOSIS — R293 Abnormal posture: Secondary | ICD-10-CM

## 2017-06-27 DIAGNOSIS — R2681 Unsteadiness on feet: Secondary | ICD-10-CM

## 2017-06-27 DIAGNOSIS — R2689 Other abnormalities of gait and mobility: Secondary | ICD-10-CM

## 2017-06-27 DIAGNOSIS — M6281 Muscle weakness (generalized): Secondary | ICD-10-CM

## 2017-06-27 NOTE — Therapy (Signed)
[facility] [address] [place], [place], [address] Phone: [phone]   Fax:  [phone]  Physical Therapy Treatment  Patient Details  Name: Kristine Curtis MRN: [id number] Date of Birth: [birth year] Referring Provider: Helane Rima, DO   Encounter Date: 06/27/2017  PT End of Session - 06/27/17 1506    Visit Number  8    Number of Visits  12    Date for PT Re-Evaluation  07/15/17    Authorization Type  [employer] private insurance    PT Start Time  1235    PT Stop Time  1315    PT Time Calculation (min)  40 min    Activity Tolerance  Patient tolerated treatment well    Behavior During Therapy  [facility] for tasks assessed/performed       Past Medical History:  Diagnosis Date  . Allergic rhinitis due to pollen   . Asthma   . Chronic combined systolic and diastolic CHF (congestive heart failure) (HCC)    06/2016 Echo: EF 40-45%, Gr1 DD  . Depression   . Family history of polycystic kidney with negative CT 11/16/2012  . Fibromyalgia   . GERD (gastroesophageal reflux disease)   . Hypertension   . Hypothyroidism   . IBS (irritable bowel syndrome)   . Internal hemorrhoids   . Morbid obesity (HCC)   . NICM (nonischemic cardiomyopathy) (HCC)    a. 1999 Cath: nl cors;  b. EF prev as low as 35%;  c. 11/2015 Echo: Ef 40-45%;  d. 06/2016 Echo: EF 40-45%;  e. Lexiscan MV: fixed anterosepta/inferseptal defect w/o ischemia, EF 35%.  . Osteoarthritis   . PVC (premature ventricular contraction)   . Restless leg syndrome   . Sicca (HCC) 11/22/2014  . Vertigo     Past Surgical History:  Procedure Laterality Date  . ABDOMINAL HYSTERECTOMY    . CESAREAN SECTION    . CHOLECYSTECTOMY  10-2008  . COLONOSCOPY    . ESOPHAGOGASTRODUODENOSCOPY    . EYE SURGERY     2 2013 and 1981/DCR of right eye 05/2014  . LUMBAR DISC SURGERY  2016   L3-4  . RIGHT/LEFT HEART CATH AND CORONARY ANGIOGRAPHY N/A 07/11/2016   Procedure: Right/Left Heart  Cath and Coronary Angiography;  Surgeon: Peter M Swaziland, MD;  Location: [facility] INVASIVE CV LAB;  Service: Cardiovascular;  Laterality: N/A;  . SHOULDER ARTHROSCOPY W/ SUBACROMIAL DECOMPRESSION AND DISTAL CLAVICLE EXCISION Right   . SPINE SURGERY     C6-C7, 03/2017  . TUBAL LIGATION  1992    There were no vitals filed for this visit.  Subjective Assessment - 06/27/17 1236    Subjective  Pain was significant last night.  Today, pain is 5/10 today.  "I was somewhat able to stretch this morning and get some relief from it."    Pertinent History  systolic and diastolic heart failure, history of PACs and PVCs by Holter monitor, fibromyalgia, restless leg syndrome, hypertension, depression    Patient Stated Goals  Reduce pain, improve balance, improve posture    Currently in Pain?  Yes    Pain Score  5     Pain Location  Back    Pain Orientation  Lower    Pain Descriptors / Indicators  Aching    Pain Onset  1 to 4 weeks ago    Pain Frequency  Constant    Aggravating Factors   has been acutely aggravated since injury on 06/11/17    Pain Relieving Factors  improved with therapy yesterday         [facility] PT Assessment - 06/26/17 1337      Palpation   Palpation comment  Significant pain with muscle guarding lateral border of R sacrum along long posterior SI ligament and surrounding musculature.                   [facility] Adult PT Treatment/Exercise - 06/27/17 1514      Ambulation/Gait   Ambulation/Gait  Yes    Ambulation/Gait Assistance  7: Independent    Ambulation Distance (Feet)  400 Feet    Ambulation Surface  Level;Indoor    Gait Comments  Patient tolerated walking x 400 ft to warm up and then at end of session with no pain after stretching and manual techniques.       Exercises   Exercises  Other Exercises    Other Exercises   Hip stretching sidelying left with R leg extended and flexed to 90 at the hip to open up through right sacral border.  Supine trunk rotation lumbar  rocking, supine PROM hamstring stretch with heel cord release.        Manual Therapy   Manual Therapy  Soft tissue mobilization    Manual therapy comments  To reduce pain and stiffness throughout spinal musculature    Soft tissue mobilization  R sacral border soft tissue mobilization with trigger point release, gentle scalene stretch PROM, and gentle distraction UEs in supine.             PT Education - 06/27/17 1506    Education provided  Yes    Education Details  recommended return to prior stretching program as able to tolerate    Person(s) Educated  Patient    Methods  Explanation    Comprehension  Verbalized understanding       PT Short Term Goals - 05/16/17 1142      PT SHORT TERM GOAL #1   Title  The patient will be indep with HEP for low back/core stabilization, thoracic extension (when tolerated with recent neck surgery), postural strengthening, LE strengthening and balance.    Time  4    Period  Weeks    Target Date  06/15/17      PT SHORT TERM GOAL #2   Title  The patient will improve LE strength for hip flexion and abduction to 4/5 to demo improving stability for gait/standing activities.    Time  4    Period  Weeks    Target Date  06/15/17      PT SHORT TERM GOAL #3   Title  The patient will improve gait speed from 2.46 to > or equal to 2.8 ft/sec to demo improving functional mobility.    Time  4    Period  Weeks    Target Date  06/15/17      PT SHORT TERM GOAL #4   Title  The patient will be further assessed on balance per Berg or FGA as indicated and LTG to follow.    Time  4    Period  Weeks    Target Date  06/15/17        PT Long Term Goals - 05/16/17 1143      PT LONG TERM GOAL #1   Title  The patient will verbalize understanding of progression of HEP/community wellness for long term participation in exercise.    Time  8    Target Date  07/15/17  PT LONG TERM GOAL #2   Title  The patient will improve gait speed from 2.46 ft/sec to >  or equal to 3.0 ft/sec to demo transition to "full community ambulator" classification of gait and demo improved functional mobility.    Time  8    Period  Weeks    Target Date  07/15/17      PT LONG TERM GOAL #3   Title  The patient will report pain in low back 7/10 at worst.    Baseline  *described greater than 10/10 frequently t/o the week*    Time  8    Period  Weeks    Target Date  07/15/17            Plan - 06/27/17 1524    Clinical Impression Statement  The patient had significant reduction in pain with opening/stretching of R sacral border.  Her pain reduced from 5/10 to 0/10 and remained at 0/10 with walking at end of session.  PT recommended gentle return to prior stretching to tolerance.    PT Treatment/Interventions  ADLs/Self Care Home Management;Therapeutic exercise;Therapeutic activities;Manual techniques;Patient/family education;Gait training;Functional mobility training;Balance training;Neuromuscular re-education;Vestibular    PT Next Visit Plan  Attempt return to prior HEP, CHECK STGs.     Consulted and Agree with Plan of Care  Patient       Patient will benefit from skilled therapeutic intervention in order to improve the following deficits and impairments:  Abnormal gait, Obesity, Decreased strength, Pain, Decreased balance, Difficulty walking, Postural dysfunction  Visit Diagnosis: Other abnormalities of gait and mobility  Muscle weakness (generalized)  Abnormal posture  Unsteadiness on feet     Problem List Patient Active Problem List   Diagnosis Date Noted  . Dizziness 03/01/2017  . Morbid obesity (HCC) 02/24/2017  . Sjogren's disease (HCC) 02/24/2017  . Dense breasts 02/03/2017  . ETD (eustachian tube dysfunction) 01/21/2017  . Fatigue 01/21/2017  . Syncope and collapse   . Failed back syndrome of lumbar spine 12/30/2016  . Chronic low back pain with sciatica 05/02/2015  . Chronic systolic congestive heart failure (HCC) 03/22/2015  . Sicca  (HCC) 11/22/2014  . Family history of polycystic kidney 11/16/2012  . Vitamin D deficiency 10/09/2009  . Other and unspecified hyperlipidemia 11/15/2008  . Major depression in complete remission (HCC) 08/31/2008  . Essential hypertension 08/31/2008  . Allergic rhinitis 08/31/2008  . Asthma 08/31/2008  . GERD 08/31/2008  . Osteoarthritis 08/31/2008  . Hypothyroidism 08/30/2008  . Classical migraine without intractable migraine 08/30/2008  . Primary fibromyalgia syndrome 08/30/2008  . Abnormal Pap smear of cervix 05/07/1995    Kristine Curtis, PT 06/27/2017, 3:26 PM  [place] [facility] [address] [place], [place], [address] Phone: [phone]   Fax:  [phone]  Name: Kristine Curtis MRN: [id number] Date of Birth: 1961/02/29

## 2017-07-02 ENCOUNTER — Ambulatory Visit: Payer: 59 | Admitting: Rehabilitative and Restorative Service Providers"

## 2017-07-02 ENCOUNTER — Encounter: Payer: Self-pay | Admitting: Rehabilitative and Restorative Service Providers"

## 2017-07-02 DIAGNOSIS — R2689 Other abnormalities of gait and mobility: Secondary | ICD-10-CM | POA: Diagnosis not present

## 2017-07-02 DIAGNOSIS — M6281 Muscle weakness (generalized): Secondary | ICD-10-CM

## 2017-07-02 DIAGNOSIS — R2681 Unsteadiness on feet: Secondary | ICD-10-CM

## 2017-07-02 DIAGNOSIS — R293 Abnormal posture: Secondary | ICD-10-CM

## 2017-07-02 NOTE — Patient Instructions (Signed)
Flexibility: Upper Trapezius Stretch    RIGHT HAND/arm reach to the floor (not behind your back).  Let your head tilt to the left side bringing ear towards shoulder. Keep shoulders level.  Hold for 10 seconds.  Relax and repeat to the other side.  Do 4-5 times on each side.  *You do not have to use your left hand to pull towards your left shoulder *wait until we get used to the stretch.   http://orth.exer.us/341   Copyright  VHI. All rights reserved.

## 2017-07-02 NOTE — Therapy (Signed)
[facility] [address] [place], [place], [address] Phone: [phone]   Fax:  [phone]  Physical Therapy Treatment  Patient Details  Name: Kristine Curtis MRN: [id number] Date of Birth: February 20, 1961 Referring Provider: Helane Rima, DO   Encounter Date: 07/02/2017  PT End of Session - 07/02/17 1420    Visit Number  9    Number of Visits  12    Date for PT Re-Evaluation  07/15/17    Authorization Type  [employer] private insurance    PT Start Time  1326    PT Stop Time  1405    PT Time Calculation (min)  39 min    Activity Tolerance  Patient tolerated treatment well    Behavior During Therapy  [facility] for tasks assessed/performed       Past Medical History:  Diagnosis Date  . Allergic rhinitis due to pollen   . Asthma   . Chronic combined systolic and diastolic CHF (congestive heart failure) (HCC)    06/2016 Echo: EF 40-45%, Gr1 DD  . Depression   . Family history of polycystic kidney with negative CT 11/16/2012  . Fibromyalgia   . GERD (gastroesophageal reflux disease)   . Hypertension   . Hypothyroidism   . IBS (irritable bowel syndrome)   . Internal hemorrhoids   . Morbid obesity (HCC)   . NICM (nonischemic cardiomyopathy) (HCC)    a. 1999 Cath: nl cors;  b. EF prev as low as 35%;  c. 11/2015 Echo: Ef 40-45%;  d. 06/2016 Echo: EF 40-45%;  e. Lexiscan MV: fixed anterosepta/inferseptal defect w/o ischemia, EF 35%.  . Osteoarthritis   . PVC (premature ventricular contraction)   . Restless leg syndrome   . Sicca (HCC) 11/22/2014  . Vertigo     Past Surgical History:  Procedure Laterality Date  . ABDOMINAL HYSTERECTOMY    . CESAREAN SECTION    . CHOLECYSTECTOMY  10-2008  . COLONOSCOPY    . ESOPHAGOGASTRODUODENOSCOPY    . EYE SURGERY     2 2013 and 1981/DCR of right eye 05/2014  . LUMBAR DISC SURGERY  2016   L3-4  . RIGHT/LEFT HEART CATH AND CORONARY ANGIOGRAPHY N/A 07/11/2016   Procedure: Right/Left Heart  Cath and Coronary Angiography;  Surgeon: Peter M Swaziland, MD;  Location: [facility] INVASIVE CV LAB;  Service: Cardiovascular;  Laterality: N/A;  . SHOULDER ARTHROSCOPY W/ SUBACROMIAL DECOMPRESSION AND DISTAL CLAVICLE EXCISION Right   . SPINE SURGERY     C6-C7, 03/2017  . TUBAL LIGATION  1992    There were no vitals filed for this visit.  Subjective Assessment - 07/02/17 1326    Subjective  The patient notes that she is back to some stretching at home.  She still feels guarded t/o the day and feels like neck and R shoulder are up to 9-10/10 pain due to muscle guarding.  She gets pain into scapula into thoracic spine and SI joint radiating laterally to groin (sometimes radiates around left too)    Patient Stated Goals  Reduce pain, improve balance, improve posture    Currently in Pain?  Yes    Pain Score  5  shoulder has "discomfort" not really painful right now    Pain Location  Back    Pain Orientation  Lower    Pain Descriptors / Indicators  Aching    Pain Type  Chronic pain    Pain Radiating Towards  radiates into groin mostly on R side.    Pain  Onset  1 to 4 weeks ago    Pain Frequency  Constant    Aggravating Factors   has been aggravated since injury 06/11/17    Pain Relieving Factors  improved with stretching                      [facility] Adult PT Treatment/Exercise - 07/02/17 1420      Ambulation/Gait   Ambulation/Gait  Yes    Ambulation/Gait Assistance  7: Independent    Ambulation Distance (Feet)  400 Feet    Ambulation Surface  Level;Indoor    Gait Comments  Used gait activities in between stretching exercises to relax and encourage arm swing and general mobilty.  Recommend continuation of home walking program.       Self-Care   Self-Care  Other Self-Care Comments    Other Self-Care Comments   Discussed PT plan of care.  Patient had set back due to acute pain after stepping wrong off edgeof step.  Anticipate continuation of PT to 1x/week while trying to transition to  community program/ aquatics.      Exercises   Exercises  Other Exercises    Other Exercises   Supine:  towel roll stretch for anterior chest musculature with neural glide for overpressure/stretch R UE.  Standing upper trapezius stretch x 2 reps both sides with demonstration and visual cues.  Sidelying hip hiking/depression with ball under LE with overpressure for quadratus lumborum stretch.  Standing quadratus lumborum stretch.      Manual Therapy   Manual Therapy  Soft tissue mobilization    Manual therapy comments  For pain reduction    Soft tissue mobilization  R sacral border, bilateral quadratus lumborum, and right upper trapezius    Muscle Energy Technique  contract/relax R upper trap for muscle elongation.             PT Education - 07/02/17 1420    Education provided  Yes    Education Details  HEP: upper trapezius stretch    Person(s) Educated  Patient    Methods  Explanation;Demonstration;Handout    Comprehension  Verbalized understanding;Returned demonstration       PT Short Term Goals - 07/02/17 1433      PT SHORT TERM GOAL #1   Title  The patient will be indep with HEP for low back/core stabilization, thoracic extension (when tolerated with recent neck surgery), postural strengthening, LE strengthening and balance.    Baseline  Patient has HEP, however has held some activities due to recent increase in pain 06/11/17 from injury.    Time  4    Period  Weeks    Status  Achieved      PT SHORT TERM GOAL #2   Title  The patient will improve LE strength for hip flexion and abduction to 4/5 to demo improving stability for gait/standing activities.    Time  4    Period  Weeks      PT SHORT TERM GOAL #3   Title  The patient will improve gait speed from 2.46 to > or equal to 2.8 ft/sec to demo improving functional mobility.    Time  4    Period  Weeks      PT SHORT TERM GOAL #4   Title  The patient will be further assessed on balance per Berg or FGA as indicated and  LTG to follow.    Time  4    Period  Weeks  PT Long Term Goals - 05/16/17 1143      PT LONG TERM GOAL #1   Title  The patient will verbalize understanding of progression of HEP/community wellness for long term participation in exercise.    Time  8    Target Date  07/15/17      PT LONG TERM GOAL #2   Title  The patient will improve gait speed from 2.46 ft/sec to > or equal to 3.0 ft/sec to demo transition to "full community ambulator" classification of gait and demo improved functional mobility.    Time  8    Period  Weeks    Target Date  07/15/17      PT LONG TERM GOAL #3   Title  The patient will report pain in low back 7/10 at worst.    Baseline  *described greater than 10/10 frequently t/o the week*    Time  8    Period  Weeks    Target Date  07/15/17            Plan - 07/02/17 1434    Clinical Impression Statement  The patient had reduction in pain from 5/10 to 0/10 with therapy today.  We discussed anticipation of longer length of stay due to set back from injury on 06/11/17.  PT is working on reducing tightness with hopes of returning to strengthening activities next week and then dropping to 1x/week in PT while working on community transition.      PT Treatment/Interventions  ADLs/Self Care Home Management;Therapeutic exercise;Therapeutic activities;Manual techniques;Patient/family education;Gait training;Functional mobility training;Balance training;Neuromuscular re-education;Vestibular    PT Next Visit Plan  Attempt return to prior HEP, CHECK STGs.     Consulted and Agree with Plan of Care  Patient       Patient will benefit from skilled therapeutic intervention in order to improve the following deficits and impairments:  Abnormal gait, Obesity, Decreased strength, Pain, Decreased balance, Difficulty walking, Postural dysfunction  Visit Diagnosis: Other abnormalities of gait and mobility  Muscle weakness (generalized)  Abnormal posture  Unsteadiness on  feet     Problem List Patient Active Problem List   Diagnosis Date Noted  . Dizziness 03/01/2017  . Morbid obesity (HCC) 02/24/2017  . Sjogren's disease (HCC) 02/24/2017  . Dense breasts 02/03/2017  . ETD (eustachian tube dysfunction) 01/21/2017  . Fatigue 01/21/2017  . Syncope and collapse   . Failed back syndrome of lumbar spine 12/30/2016  . Chronic low back pain with sciatica 05/02/2015  . Chronic systolic congestive heart failure (HCC) 03/22/2015  . Sicca (HCC) 11/22/2014  . Family history of polycystic kidney 11/16/2012  . Vitamin D deficiency 10/09/2009  . Other and unspecified hyperlipidemia 11/15/2008  . Major depression in complete remission (HCC) 08/31/2008  . Essential hypertension 08/31/2008  . Allergic rhinitis 08/31/2008  . Asthma 08/31/2008  . GERD 08/31/2008  . Osteoarthritis 08/31/2008  . Hypothyroidism 08/30/2008  . Classical migraine without intractable migraine 08/30/2008  . Primary fibromyalgia syndrome 08/30/2008  . Abnormal Pap smear of cervix 05/07/1995    Ludene Stokke, PT 07/02/2017, 2:35 PM  [place] [facility] [address] [place], [place], [address] Phone: [phone]   Fax:  [phone]  Name: NAISHA WISDOM MRN: [id number] Date of Birth: 1961-03-30

## 2017-07-04 ENCOUNTER — Ambulatory Visit: Payer: 59 | Attending: Family Medicine | Admitting: Rehabilitative and Restorative Service Providers"

## 2017-07-04 ENCOUNTER — Encounter: Payer: Self-pay | Admitting: Rehabilitative and Restorative Service Providers"

## 2017-07-04 DIAGNOSIS — M6281 Muscle weakness (generalized): Secondary | ICD-10-CM | POA: Insufficient documentation

## 2017-07-04 DIAGNOSIS — R293 Abnormal posture: Secondary | ICD-10-CM | POA: Insufficient documentation

## 2017-07-04 DIAGNOSIS — R2681 Unsteadiness on feet: Secondary | ICD-10-CM | POA: Insufficient documentation

## 2017-07-04 DIAGNOSIS — R2689 Other abnormalities of gait and mobility: Secondary | ICD-10-CM

## 2017-07-04 NOTE — Therapy (Signed)
[facility] [address] [place], [place], [address] Phone: [phone]   Fax:  [phone]  Physical Therapy Treatment  Patient Details  Name: Kristine Curtis MRN: [id number] Date of Birth: [birth year] Referring Provider: Briscoe Deutscher, DO   Encounter Date: 07/04/2017  PT End of Session - 07/04/17 1402    Visit Number  10    Number of Visits  12    Date for PT Re-Evaluation  07/15/17    Authorization Type  [employer] private insurance    PT Start Time  1320    PT Stop Time  1402    PT Time Calculation (min)  42 min    Activity Tolerance  Patient tolerated treatment well    Behavior During Therapy  [facility] for tasks assessed/performed       Past Medical History:  Diagnosis Date  . Allergic rhinitis due to pollen   . Asthma   . Chronic combined systolic and diastolic CHF (congestive heart failure) ([place])    06/2016 Echo: EF 40-45%, Gr1 DD  . Depression   . Family history of polycystic kidney with negative CT 11/16/2012  . Fibromyalgia   . GERD (gastroesophageal reflux disease)   . Hypertension   . Hypothyroidism   . IBS (irritable bowel syndrome)   . Internal hemorrhoids   . Morbid obesity ([place])   . NICM (nonischemic cardiomyopathy) ([place])    a. 1999 Cath: nl cors;  b. EF prev as low as 35%;  c. 11/2015 Echo: Ef 40-45%;  d. 06/2016 Echo: EF 40-45%;  e. Lexiscan MV: fixed anterosepta/inferseptal defect w/o ischemia, EF 35%.  . Osteoarthritis   . PVC (premature ventricular contraction)   . Restless leg syndrome   . Sicca ([place]) 11/22/2014  . Vertigo     Past Surgical History:  Procedure Laterality Date  . ABDOMINAL HYSTERECTOMY    . CESAREAN SECTION    . CHOLECYSTECTOMY  10-2008  . COLONOSCOPY    . ESOPHAGOGASTRODUODENOSCOPY    . EYE SURGERY     2 2013 and 1981/DCR of right eye 05/2014  . LUMBAR DISC SURGERY  2016   L3-4  . RIGHT/LEFT HEART CATH AND CORONARY ANGIOGRAPHY N/A 07/11/2016   Procedure: Right/Left Heart  Cath and Coronary Angiography;  Surgeon: Peter M Martinique, MD;  Location: [facility];  Service: Cardiovascular;  Laterality: N/A;  . SHOULDER ARTHROSCOPY W/ SUBACROMIAL DECOMPRESSION AND DISTAL CLAVICLE EXCISION Right   . SPINE SURGERY     C6-C7, 03/2017  . TUBAL LIGATION  1992    There were no vitals filed for this visit.  Subjective Assessment - 07/04/17 1325    Subjective  The patient reports she got very sore from therapy Wednesday but feels we are working through the recent inflammation.  She modified upper trap stretch to make shoulders stay more level.     Pertinent History  systolic and diastolic heart failure, history of PACs and PVCs by Holter monitor, fibromyalgia, restless leg syndrome, hypertension, depression    Patient Stated Goals  Reduce pain, improve balance, improve posture    Currently in Pain?  Yes    Pain Score  3     Pain Location  Back    Pain Orientation  Lower    Pain Descriptors / Indicators  Aching    Pain Type  Chronic pain    Pain Onset  1 to 4 weeks ago    Pain Frequency  Constant    Aggravating Factors   aggravated  since injury 2.6/19    Pain Relieving Factors  improved with stretching                      [facility] Adult PT Treatment/Exercise - 07/04/17 1346      Neuro Re-ed    Neuro Re-ed Details   Reviewed prior balance HEP of eyes closed in corner.  Attempted SLR, however increases R sacral pain--removed from HEP- will plan to reintroduce.      Exercises   Exercises  Other Exercises    Other Exercises   Began return to prior HEP:  checked chin tuck x 10 reps supine, sidelying hip abduction x 5 reps bilaterally modified with piillow under LE, Modified bridge to pelvic tilt, not able to tolerate supine marching.  Door frame stretching x 2 reps moving UE position from shoulder to overhead height.  Performed  supine towel roll stretch.  Seated theraband retraction, prone scapular retraction.               PT Education -  07/04/17 1401    Education provided  Yes    Education Details  modified HEP- see printout    Person(s) Educated  Patient    Methods  Explanation;Demonstration;Handout    Comprehension  Verbalized understanding;Returned demonstration       PT Short Term Goals - 07/04/17 1334      PT SHORT TERM GOAL #1   Title  The patient will be indep with HEP for low back/core stabilization, thoracic extension (when tolerated with recent neck surgery), postural strengthening, LE strengthening and balance.    Baseline  Patient has HEP, however has held some activities due to recent increase in pain 06/11/17 from injury.    Time  4    Period  Weeks    Status  Achieved      PT SHORT TERM GOAL #2   Title  The patient will improve LE strength for hip flexion and abduction to 4/5 to demo improving stability for gait/standing activities.    Baseline  Bilateral LEs 3/5 hip flexion, R hip abd 3/5 and L hip abd 4/5    Time  4    Period  Weeks    Status  Not Met      PT SHORT TERM GOAL #3   Title  The patient will improve gait speed from 2.46 to > or equal to 2.8 ft/sec to demo improving functional mobility.    Baseline  3.63 ft/sec - patient notes that is close to her top speed -- she then demonstrated a more natural pace of 3.46 ft/sec     Time  4    Period  Weeks    Status  Achieved      PT SHORT TERM GOAL #4   Title  The patient will be further assessed on balance per Berg or FGA as indicated and LTG to follow.    Time  4    Period  Weeks    Status  On-going        PT Long Term Goals - 05/16/17 1143      PT LONG TERM GOAL #1   Title  The patient will verbalize understanding of progression of HEP/community wellness for long term participation in exercise.    Time  8    Target Date  07/15/17      PT LONG TERM GOAL #2   Title  The patient will improve gait speed from 2.46 ft/sec to > or equal  to 3.0 ft/sec to demo transition to "full community ambulator" classification of gait and demo improved  functional mobility.    Time  8    Period  Weeks    Target Date  07/15/17      PT LONG TERM GOAL #3   Title  The patient will report pain in low back 7/10 at worst.    Baseline  *described greater than 10/10 frequently t/o the week*    Time  8    Period  Weeks    Target Date  07/15/17            Plan - 07/04/17 1618    Clinical Impression Statement  The patinet has met 2 STGs.  The patient is continuing to have a slow decline in pain after inflammatory response to event on 06/11/17.  PT is slowly having her return to prior home exercises with modifications made today due to recent increase in symptoms.  Plan to continue to progress as tolerated.    PT Treatment/Interventions  ADLs/Self Care Home Management;Therapeutic exercise;Therapeutic activities;Manual techniques;Patient/family education;Gait training;Functional mobility training;Balance training;Neuromuscular re-education;Vestibular    PT Next Visit Plan  See how HEP going, check STGs (balance).  Plan to begin to transition to home/community exercise program    Consulted and Agree with Plan of Care  Patient       Patient will benefit from skilled therapeutic intervention in order to improve the following deficits and impairments:  Abnormal gait, Obesity, Decreased strength, Pain, Decreased balance, Difficulty walking, Postural dysfunction  Visit Diagnosis: Other abnormalities of gait and mobility  Muscle weakness (generalized)  Abnormal posture  Unsteadiness on feet     Problem List Patient Active Problem List   Diagnosis Date Noted  . Dizziness 03/01/2017  . Morbid obesity ([place]) 02/24/2017  . Sjogren's disease ([place]) 02/24/2017  . Dense breasts 02/03/2017  . ETD (eustachian tube dysfunction) 01/21/2017  . Fatigue 01/21/2017  . Syncope and collapse   . Failed back syndrome of lumbar spine 12/30/2016  . Chronic low back pain with sciatica 05/02/2015  . Chronic systolic congestive heart failure ([place]) 03/22/2015   . Sicca ([place]) 11/22/2014  . Family history of polycystic kidney 11/16/2012  . Vitamin D deficiency 10/09/2009  . Other and unspecified hyperlipidemia 11/15/2008  . Major depression in complete remission ([place]) 08/31/2008  . Essential hypertension 08/31/2008  . Allergic rhinitis 08/31/2008  . Asthma 08/31/2008  . GERD 08/31/2008  . Osteoarthritis 08/31/2008  . Hypothyroidism 08/30/2008  . Classical migraine without intractable migraine 08/30/2008  . Primary fibromyalgia syndrome 08/30/2008  . Abnormal Pap smear of cervix 05/07/1995    Jenah Vanasten 07/04/2017, 4:23 PM  [facility] [address] [place], [place], [address] Phone: [phone]   Fax:  [phone]  Name: INDIANA GAMERO MRN: [id number] Date of Birth: August 02, 1960

## 2017-07-04 NOTE — Patient Instructions (Addendum)
Pelvic Tilt    Flatten back by tightening stomach muscles and buttocks. Repeat _10___ times per set. Do _1___ sets per session. Do __2__ sessions per day.  http://orth.exer.us/135   Copyright  VHI. All rights reserved.   Thoracic Self-Mobilization (Supine)    With rolled towel placed lengthwise at lower ribs level, lie back on towel with arms outstretched. Hold _2 minutes. Relax. Repeat __1-2__ times per day as needed for postural stretching.  http://orth.exer.us/1001   Copyright  VHI. All rights reserved.   Horizontal Abduction (Resistive Band)    With arms at shoulder level, keep elbows straight. CAN DO BOTH ARMS AT THE SAME TIME * PALMS UP *. Hold __5__ seconds. Repeat _10___ times. Do __1-2__ sessions per day.  Copyright  VHI. All rights reserved.   Extensors, Supine    Lie supine, head on small, rolled towel or one pillow. Gently tuck chin and bring toward chest. Hold __5_ seconds. Repeat __10_ times per session. Do _1-2__ sessions per day.  Copyright  VHI. All rights reserved.  Strengthening: Hip Abduction (Side-Lying)    LIE ON YOUR SIDE AND PLACE A FOLDED PILLOW UNDER TOP LEG (AT ANKLE).Tighten muscles on front of left thigh, then lift leg _6___ inches from surface, keeping knee locked.  Repeat __5_ times per set. Do __1__ sets per session. Do __2__ sessions per day.  http://orth.exer.us/623  Copyright  VHI. All rights reserved.  Feet Apart, Varied Arm Positions - Eyes Closed    Stand with feet shoulder width apart and arms out. Close eyes and visualize upright position. Hold __30__ seconds. Repeat _3___ times per session. Do _1-2___ sessions per day.  Copyright  VHI. All rights reserved.   Scapular Retraction: Abduction (Prone)    Lie with upper arms straight out from sides, elbows bent to 90. Pinch shoulder blades together and raise arms a few inches from floor. Repeat _10-15___ times per set. Do __1__ sets per  session. Do _1-2___ sessions per day.  http://orth.exer.us/957   Copyright  VHI. All rights reserved.

## 2017-07-09 ENCOUNTER — Ambulatory Visit: Payer: 59 | Admitting: Rehabilitative and Restorative Service Providers"

## 2017-07-09 ENCOUNTER — Other Ambulatory Visit: Payer: Self-pay | Admitting: Cardiology

## 2017-07-09 ENCOUNTER — Encounter: Payer: Self-pay | Admitting: Rehabilitative and Restorative Service Providers"

## 2017-07-09 DIAGNOSIS — R2689 Other abnormalities of gait and mobility: Secondary | ICD-10-CM | POA: Diagnosis not present

## 2017-07-09 DIAGNOSIS — M6281 Muscle weakness (generalized): Secondary | ICD-10-CM

## 2017-07-09 DIAGNOSIS — R293 Abnormal posture: Secondary | ICD-10-CM

## 2017-07-09 NOTE — Telephone Encounter (Signed)
REFILL 

## 2017-07-10 NOTE — Therapy (Signed)
[facility] [address] [place], [place], [address] Phone: [phone]   Fax:  [phone]  Physical Therapy Treatment  Patient Details  Name: Kristine Curtis MRN: [id number] Date of Birth: 07/02/1960 Referring Provider: Briscoe Deutscher, DO   Encounter Date: 07/09/2017  PT End of Session - 07/09/17 2156    Visit Number  11    Number of Visits  12    Date for PT Re-Evaluation  07/15/17    Authorization Type  [employer] private insurance    PT Start Time  1320    PT Stop Time  1410    PT Time Calculation (min)  50 min    Activity Tolerance  Patient tolerated treatment well    Behavior During Therapy  [facility] for tasks assessed/performed       Past Medical History:  Diagnosis Date  . Allergic rhinitis due to pollen   . Asthma   . Chronic combined systolic and diastolic CHF (congestive heart failure) ([place])    06/2016 Echo: EF 40-45%, Gr1 DD  . Depression   . Family history of polycystic kidney with negative CT 11/16/2012  . Fibromyalgia   . GERD (gastroesophageal reflux disease)   . Hypertension   . Hypothyroidism   . IBS (irritable bowel syndrome)   . Internal hemorrhoids   . Morbid obesity ([place])   . NICM (nonischemic cardiomyopathy) ([place])    a. 1999 Cath: nl cors;  b. EF prev as low as 35%;  c. 11/2015 Echo: Ef 40-45%;  d. 06/2016 Echo: EF 40-45%;  e. Lexiscan MV: fixed anterosepta/inferseptal defect w/o ischemia, EF 35%.  . Osteoarthritis   . PVC (premature ventricular contraction)   . Restless leg syndrome   . Sicca ([place]) 11/22/2014  . Vertigo     Past Surgical History:  Procedure Laterality Date  . ABDOMINAL HYSTERECTOMY    . CESAREAN SECTION    . CHOLECYSTECTOMY  10-2008  . COLONOSCOPY    . ESOPHAGOGASTRODUODENOSCOPY    . EYE SURGERY     2 2013 and 1981/DCR of right eye 05/2014  . LUMBAR DISC SURGERY  2016   L3-4  . RIGHT/LEFT HEART CATH AND CORONARY ANGIOGRAPHY N/A 07/11/2016   Procedure: Right/Left Heart  Cath and Coronary Angiography;  Surgeon: Peter M Martinique, MD;  Location: [facility];  Service: Cardiovascular;  Laterality: N/A;  . SHOULDER ARTHROSCOPY W/ SUBACROMIAL DECOMPRESSION AND DISTAL CLAVICLE EXCISION Right   . SPINE SURGERY     C6-C7, 03/2017  . TUBAL LIGATION  1992    There were no vitals filed for this visit.  Subjective Assessment - 07/09/17 1325    Subjective  The patient reports she is having good and bad days.  She notes she is still getting some bands of pain in legs and arms described as "nerve pain" with hard knot in her muscles. She notes pain with head motion to the right side worse with turning "like there is a catch".     She notes a "tremendous amount of numbness in fingers, hands, and legs".   Yesterday was a "bad day", she spent most of the day laying down.     Patient Stated Goals  Reduce pain, improve balance, improve posture    Currently in Pain?  Yes    Pain Score  8     Pain Location  Back    Pain Orientation  Lower    Pain Descriptors / Indicators  Aching    Pain Onset  More than a month ago    Pain Frequency  Constant    Aggravating Factors   still inflammed after injury 06/11/17    Pain Relieving Factors  imporved with stretching         [facility] PT Assessment - 07/09/17 1400      Ambulation/Gait   Ambulation/Gait  Yes                  [facility] Adult PT Treatment/Exercise - 07/09/17 1400      Ambulation/Gait   Ambulation/Gait Assistance  7: Independent    Gait Comments  Patient ambulates today at slowed, guarded pace due to increased back pain that radiates into groin region.       Self-Care   Self-Care  Other Self-Care Comments    Other Self-Care Comments   PT and patient discussed attempting current HEP as tolerated as she is having good/bad days.  Discussed walking for short distances throughout the day to maintain mobility.      Exercises   Exercises  Other Exercises    Other Exercises   Supine Hip hike/depression, Seated  piriformis stretch, supine pelvic tilts      Manual Therapy   Manual Therapy  Soft tissue mobilization    Manual therapy comments  for pain reduction    Soft tissue mobilization  R sacral border and quadratus lumborum (sidelying L for R QL)               PT Short Term Goals - 07/04/17 1334      PT SHORT TERM GOAL #1   Title  The patient will be indep with HEP for low back/core stabilization, thoracic extension (when tolerated with recent neck surgery), postural strengthening, LE strengthening and balance.    Baseline  Patient has HEP, however has held some activities due to recent increase in pain 06/11/17 from injury.    Time  4    Period  Weeks    Status  Achieved      PT SHORT TERM GOAL #2   Title  The patient will improve LE strength for hip flexion and abduction to 4/5 to demo improving stability for gait/standing activities.    Baseline  Bilateral LEs 3/5 hip flexion, R hip abd 3/5 and L hip abd 4/5    Time  4    Period  Weeks    Status  Not Met      PT SHORT TERM GOAL #3   Title  The patient will improve gait speed from 2.46 to > or equal to 2.8 ft/sec to demo improving functional mobility.    Baseline  3.63 ft/sec - patient notes that is close to her top speed -- she then demonstrated a more natural pace of 3.46 ft/sec     Time  4    Period  Weeks    Status  Achieved      PT SHORT TERM GOAL #4   Title  The patient will be further assessed on balance per Berg or FGA as indicated and LTG to follow.    Time  4    Period  Weeks    Status  On-going        PT Long Term Goals - 05/16/17 1143      PT LONG TERM GOAL #1   Title  The patient will verbalize understanding of progression of HEP/community wellness for long term participation in exercise.    Time  8    Target Date  07/15/17  PT LONG TERM GOAL #2   Title  The patient will improve gait speed from 2.46 ft/sec to > or equal to 3.0 ft/sec to demo transition to "full community ambulator" classification of  gait and demo improved functional mobility.    Time  8    Period  Weeks    Target Date  07/15/17      PT LONG TERM GOAL #3   Title  The patient will report pain in low back 7/10 at worst.    Baseline  *described greater than 10/10 frequently t/o the week*    Time  8    Period  Weeks    Target Date  07/15/17            Plan - 07/10/17 6004    Clinical Impression Statement  The patient was initially referred to OP PT for dizziness, however noted on eval date that her cc: chronic low back and neck pain.  PT has been addressing postural weakness to improve functional mobility.  She was progressing well with plans to begin transition to [employer], however she had a set back after an injury stepping down from a step (lost balance and took multiple steps to recover) on 06/11/17.  Since that time, she is noting increased nerve pain, decreased tolerance to exercise, and notes good and bad days.  On bad days, she does not leave the house.  She is c/o radiating pain from her back > lateral aspect of hips>anteriorly into bilateral groin region.  Due to persisting symptoms, PT recommended we hold therapy at this time and have her f/u with MD.     PT Treatment/Interventions  ADLs/Self Care Home Management;Therapeutic exercise;Therapeutic activities;Manual techniques;Patient/family education;Gait training;Functional mobility training;Balance training;Neuromuscular re-education;Vestibular    PT Next Visit Plan  Hold PT at this time.    Consulted and Agree with Plan of Care  Patient       Patient will benefit from skilled therapeutic intervention in order to improve the following deficits and impairments:  Abnormal gait, Obesity, Decreased strength, Pain, Decreased balance, Difficulty walking, Postural dysfunction  Visit Diagnosis: Other abnormalities of gait and mobility  Muscle weakness (generalized)  Abnormal posture     Problem List Patient Active Problem List   Diagnosis  Date Noted  . Dizziness 03/01/2017  . Morbid obesity ([place]) 02/24/2017  . Sjogren's disease ([place]) 02/24/2017  . Dense breasts 02/03/2017  . ETD (eustachian tube dysfunction) 01/21/2017  . Fatigue 01/21/2017  . Syncope and collapse   . Failed back syndrome of lumbar spine 12/30/2016  . Chronic low back pain with sciatica 05/02/2015  . Chronic systolic congestive heart failure ([place]) 03/22/2015  . Sicca ([place]) 11/22/2014  . Family history of polycystic kidney 11/16/2012  . Vitamin D deficiency 10/09/2009  . Other and unspecified hyperlipidemia 11/15/2008  . Major depression in complete remission ([place]) 08/31/2008  . Essential hypertension 08/31/2008  . Allergic rhinitis 08/31/2008  . Asthma 08/31/2008  . GERD 08/31/2008  . Osteoarthritis 08/31/2008  . Hypothyroidism 08/30/2008  . Classical migraine without intractable migraine 08/30/2008  . Primary fibromyalgia syndrome 08/30/2008  . Abnormal Pap smear of cervix 05/07/1995    Jamaul Heist, PT 07/10/2017, 9:10 AM  [facility] [address] [place], [place], [address] Phone: [phone]   Fax:  [phone]  Name: Kristine Curtis MRN: [id number] Date of Birth: [birth year]

## 2017-07-11 ENCOUNTER — Ambulatory Visit: Payer: 59 | Admitting: Rehabilitative and Restorative Service Providers"

## 2017-07-13 DIAGNOSIS — M1712 Unilateral primary osteoarthritis, left knee: Secondary | ICD-10-CM | POA: Insufficient documentation

## 2017-07-13 NOTE — Progress Notes (Signed)
Kristine Curtis is a 57 y.o. female is here for follow up.  History of Present Illness:   HPI: The patient was initially referred to OP PT for dizziness, however noted on eval date that her cc: chronic low back and neck pain.  PT has been addressing postural weakness to improve functional mobility.  She was progressing well with plans to begin transition to [employer], however she had a set back after an injury stepping down from a step (lost balance and took multiple steps to recover) on 06/11/17.  Since that time, she is noting increased nerve pain, decreased tolerance to exercise, and notes good and bad days.  On bad days, she does not leave the house.  She is c/o radiating pain from her back > lateral aspect of hips>anteriorly into bilateral groin region.  Due to persisting symptoms, PT recommended we hold therapy at this time and have her f/u with PCP.   There are no preventive care reminders to display for this patient.   Depression screen PHQ 2/9 06/09/2017  Decreased Interest 0  Down, Depressed, Hopeless 0  PHQ - 2 Score 0  Altered sleeping 3  Tired, decreased energy 2  Change in appetite 0  Feeling bad or failure about yourself  0  Trouble concentrating 3  Moving slowly or fidgety/restless 0  Suicidal thoughts 0  PHQ-9 Score 8   PMHx, SurgHx, SocialHx, FamHx, Medications, and Allergies were reviewed in the Visit Navigator and updated as appropriate.   Patient Active Problem List   Diagnosis Date Noted  . Myofascial pain dysfunction syndrome 07/14/2017  . Blind right eye 07/14/2017  . Cervical radiculopathy 07/14/2017  . Degenerative arthritis of left knee, XRAY12/2018 07/13/2017  . Dizziness 03/01/2017  . Morbid obesity (HCC) 02/24/2017  . Sjogren's disease (HCC) 02/24/2017  . Dense breasts 02/03/2017  . ETD (eustachian tube dysfunction) 01/21/2017  . Fatigue 01/21/2017  . Failed back syndrome of lumbar spine 12/30/2016  . Chronic low back pain with  sciatica 05/02/2015  . Chronic systolic congestive heart failure (HCC), EF 35-40% 03/22/2015  . Sicca (HCC) 11/22/2014  . Family history of polycystic kidney 11/16/2012  . Vitamin D deficiency, on daily vitamin D 10/09/2009  . Hyperlipidemia, with high triglycerides and low HDL, not on statin 11/15/2008  . Depression with anxiety 08/31/2008  . Essential hypertension, on Losartan, Coreg, and Spironolactone 08/31/2008  . Allergic rhinitis, on Flonase 08/31/2008  . Asthma, Symbicort 08/31/2008  . GERD 08/31/2008  . Osteoarthritis 08/31/2008  . Hypothyroidism, on Levothyroxine 08/30/2008  . Classical migraine without intractable migraine 08/30/2008  . Abnormal Pap smear of cervix 05/07/1995   Social History   Tobacco Use  . Smoking status: Former Smoker    Packs/day: 2.00    Years: 12.00    Pack years: 24.00    Types: Cigarettes    Last attempt to quit: 05/06/1989    Years since quitting: 28.2  . Smokeless tobacco: Never Used  . Tobacco comment: quit smoking 1990  Substance Use Topics  . Alcohol use: No    Alcohol/week: 0.0 oz  . Drug use: No   Current Medications and Allergies:    .  acetaminophen (TYLENOL) 500 MG tablet, Take 1,000 mg by mouth every 4 (four) hours as needed for moderate pain or headache., Disp: , Rfl:  .  Aspirin-Acetaminophen-Caffeine (EXCEDRIN EXTRA STRENGTH PO), Take 2 tablets by mouth 2 (two) times daily as needed (headaches)., Disp: , Rfl:  .  buPROPion (WELLBUTRIN XL) 300 MG  24 hr tablet, TAKE 1 TABLET BY MOUTH DAILY, Disp: 30 tablet, Rfl: 11 .  carvedilol (COREG) 25 MG tablet, Take 1/2 tablet ( 12.5 mg ) twice a day, Disp: 60 tablet, Rfl: 6 .  Cholecalciferol (VITAMIN D-3) 5000 UNITS TABS, Take 5,000 Units by mouth daily. , Disp: , Rfl:  .  diazepam (VALIUM) 5 MG tablet, TAKE ONE (1) TABLET BY MOUTH EVERY SIX HOURS AS NEEDED, Disp: 30 tablet, Rfl: 3 .  fluticasone (FLONASE) 50 MCG/ACT nasal spray, Place 1 spray daily into both nostrils., Disp: 16 g, Rfl:  3 .  furosemide (LASIX) 20 MG tablet, TAKE 1 TABLET BY MOUTH DAILY, Disp: 90 tablet, Rfl: 2 .  hydroxychloroquine (PLAQUENIL) 200 MG tablet, Take 400 mg by mouth every morning. , Disp: , Rfl:  .  levothyroxine (SYNTHROID, LEVOTHROID) 88 MCG tablet, TAKE 1 TABLET BY MOUTH DAILY, Disp: 30 tablet, Rfl: 2 .  Liraglutide -Weight Management (SAXENDA) 18 MG/3ML SOPN, Inject 3 mg into the skin daily. (Patient not taking: Reported on 06/09/2017), Disp: 3 mL, Rfl: 0 .  losartan (COZAAR) 50 MG tablet, Take 50 mg by mouth daily., Disp: , Rfl:  .  meloxicam (MOBIC) 7.5 MG tablet, TAKE 1 TABLET BY MOUTH DAILY, Disp: 30 tablet, Rfl: 3 .  mometasone (ASMANEX) 220 MCG/INH inhaler, Inhale 2 puffs into the lungs daily., Disp: 1 Inhaler, Rfl: 0 .  nystatin ointment (MYCOSTATIN), Apply 1 application topically 2 (two) times daily., Disp: 30 g, Rfl: 0 .  omeprazole (PRILOSEC) 40 MG capsule, TAKE ONE (1) CAPSULE BY MOUTH  BEFORE BREAKFAST, Disp: 30 capsule, Rfl: 1 .  ONE TOUCH ULTRA TEST test strip, USE AS DIRECTED TO CHECK BLOOD SUGAR ONCE A DAY, Disp: 100 each, Rfl: 3 .  OVER THE COUNTER MEDICATION, Take 5 capsules by mouth daily. [employer]ix Optimals Vitamin & Minerals , Disp: , Rfl:  .  OVER THE COUNTER MEDICATION, Take 1 scoop by mouth daily. [employer]ix Magnecal D , Disp: , Rfl:  .  oxyCODONE-acetaminophen (PERCOCET/ROXICET) 5-325 MG tablet, Take 1 tablet by mouth every 8 (eight) hours as needed for severe pain., Disp: 60 tablet, Rfl: 0 .  spironolactone (ALDACTONE) 25 MG tablet, TAKE ONE TABLET BY MOUTH EVERY DAY, Disp: 90 tablet, Rfl: 1 .  topiramate (TOPAMAX) 25 MG tablet, Take 1 tablet (25 mg total) by mouth 2 (two) times daily., Disp: 60 tablet, Rfl: 3 .  triamcinolone ointment (KENALOG) 0.5 %, Apply 1 application topically 2 (two) times daily., Disp: 30 g, Rfl: 0 .  Wheat Dextrin (BENEFIBER) POWD, Take 2 scoop by mouth daily as needed (for constipation). , Disp: , Rfl:   Allergies  Allergen Reactions  . Gabapentin  (Once-Daily)     Depression   . Sulfa Antibiotics     itching  . Adhesive [Tape] Rash  . Codeine Itching   Review of Systems   Pertinent items are noted in the HPI. Otherwise, ROS is negative.  Vitals:   Vitals:   07/14/17 0745  BP: 128/86  Pulse: 97  Temp: 97.9 F (36.6 C)  TempSrc: Oral  SpO2: 100%  Weight: 227 lb 6.4 oz (103.1 kg)  Height: 5\' 3"  (1.6 m)     Body mass index is 40.28 kg/m.  Physical Exam:   Physical Exam  Constitutional: She is oriented to person, place, and time. She appears well-developed and well-nourished. No distress.  HENT:  Head: Normocephalic and atraumatic.  Eyes: EOM are normal. Pupils are equal, round, and reactive to light.  Neck: Normal  range of motion. Neck supple.  Cardiovascular: Normal rate, regular rhythm and intact distal pulses.  Pulmonary/Chest: Effort normal.  Abdominal: Soft.  Neurological: She is alert and oriented to person, place, and time.  Skin: Skin is warm.  Psychiatric: She has a normal mood and affect. Her behavior is normal.  Nursing note and vitals reviewed.   Assessment and Plan:   Patient Active Problem List   Diagnosis Date Noted  . Myofascial pain dysfunction syndrome 07/14/2017  . Blind right eye 07/14/2017  . Cervical radiculopathy 07/14/2017  . Degenerative arthritis of left knee, XRAY12/2018 07/13/2017  . Dizziness 03/01/2017  . Morbid obesity (HCC) 02/24/2017  . Sjogren's disease (HCC) 02/24/2017  . Dense breasts 02/03/2017  . ETD (eustachian tube dysfunction) 01/21/2017  . Fatigue 01/21/2017  . Failed back syndrome of lumbar spine 12/30/2016  . Chronic low back pain with sciatica 05/02/2015  . Chronic systolic congestive heart failure (HCC), EF 35-40% 03/22/2015  . Sicca (HCC) 11/22/2014  . Family history of polycystic kidney 11/16/2012  . Vitamin D deficiency, on daily vitamin D 10/09/2009  . Hyperlipidemia, with high triglycerides and low HDL, not on statin 11/15/2008  . Depression with  anxiety 08/31/2008  . Essential hypertension, on Losartan, Coreg, and Spironolactone 08/31/2008  . Allergic rhinitis, on Flonase 08/31/2008  . Asthma, Symbicort 08/31/2008  . GERD 08/31/2008  . Osteoarthritis 08/31/2008  . Hypothyroidism, on Levothyroxine 08/30/2008  . Classical migraine without intractable migraine 08/30/2008  . Abnormal Pap smear of cervix 05/07/1995   Hyperlipidemia, with high triglycerides and low HDL, not on statin Patient is not currently on a statin. She is due for a FLP.  I am sure that she will be hesitant to start a statin due to her myofascial pain.  We will discuss after reviewing her next labs.  Depression with anxiety Decompensating.  Patient has tried Cymbalta and Prozac in the past.  She states that she was put on these medications for headaches.  She is currently taking Wellbutrin only.  We discussed medication options and will trial treatment Trintellix.  She does have an appointment to see a counselor on 07/25/2017. Patient was given information on SSRIs and possible side effects were reviewed. She was asked to contact us with any worsening in symptoms or suicidal thoughts and we discussed that it would take 2-4 weeks to begin to see improvement in her symptoms.  Recheck in 4 weeks.  Morbid obesity (HCC) She continues to tolerate the low-dose of Saxenda.  Weight continues to lower.  Exercise has been a struggle.  She is working on making healthy food decisions.  Hypothyroidism, on Levothyroxine Most recent TSH was ordered by her rheumatologist.  This was reviewed with the patient and is at goal.  Sjogren's disease [facility]) Patient is interested in seeing a rheumatologist at [facility].  States that this rheumatologist actually suffers from Sjogren's.  I will happily order that referral, but I believe the patient will need to call.  Classical migraine without intractable migraine Migraines have improved since starting the Topamax.  She is now down to 1-2 migraines  per week.  She is on 25 mg p.o. daily.  We discussed the possibility of increasing that dose in the near future.  I like to hold since we are trialing a different medication today.  Chronic low back pain with sciatica Patient has been seen Dr. Danielle Dess.  He has been helping to manage her lumbar pain.  She would like to continue with physical therapy and medication therapy as  well as injections.  Her next appointment with him is on 07/24/2017.  States that stretching has been very helpful in keeping her mobile.  Myofascial pain dysfunction syndrome This sweet patient is been working very hard undergo physical therapy for her chronic pain issues.  Unfortunately, she has decompensated somewhat and feels increased full body pain.  I did receive a note from physical therapy stating that this was worse after the patient had a misstep during her physical therapy.  Patient states that this is actually more her norm.  She "feels guarded all the time." She states that anytime her family members took her, she feels pain.  Recent thyroid, B12, TSH labs were normal.  He states that he also ordered a sleep study, but she does not want to go to have it completed.  She would be okay with home sleep study but also states that she would never wear CPAP.  She has had a sleep study in the past that did show restless leg syndrome and that she has a REM disorder.  This was done many years ago.  We will hold off on further PT for a few weeks.  We discussed working on her ongoing depression with a new medication.  With a recheck in 4 weeks to see progress and making a plan from there.  Cervical radiculopathy Followed by Dr. Danielle Dess.  She has been diagnosed with C6 and C7 pathology.  She does have bilateral hand numbness and tingling almost daily.  She states that she has been evaluated for carpal tunnel on 2 different occasions and was told that she does not have carpal tunnel.  Orders Placed This Encounter  Procedures  . CBC with  Differential/Platelet  . Comprehensive metabolic panel  . Lipid panel  . Sedimentation rate  . C-reactive protein  . Ambulatory referral to Rheumatology   Meds ordered this encounter  Medications  . vortioxetine HBr (TRINTELLIX) 5 MG TABS tablet    Sig: Take 1 tablet (5 mg total) by mouth daily.    Dispense:  30 tablet    Refill:  3  . vortioxetine HBr (TRINTELLIX) 5 MG TABS tablet    Sig: Take 1 tablet (5 mg total) by mouth daily.    Dispense:  7 tablet    Refill:  0  . Liraglutide -Weight Management (SAXENDA) 18 MG/3ML SOPN    Sig: Inject 3 mLs into the skin daily for 30 days, THEN 3 mLs daily.    Dispense:  3 mL    Refill:  0   . Reviewed expectations re: course of current medical issues. . Discussed self-management of symptoms. . Outlined signs and symptoms indicating need for more acute intervention. . Patient verbalized understanding and all questions were answered. Marland Kitchen Health Maintenance issues including appropriate healthy diet, exercise, and smoking avoidance were discussed with patient. . See orders for this visit as documented in the electronic medical record. . Patient received an After Visit Summary.  CMA served as Neurosurgeon during this visit. History, Physical, and Plan performed by medical provider. The above documentation has been reviewed and is accurate and complete. Helane Rima, D.O.  Helane Rima, DO [place], [place] 07/14/2017  Records requested if needed. Time spent with the patient: 45 minutes, of which >50% was spent in obtaining information about her symptoms, reviewing her previous labs, evaluations, and treatments, counseling her about her condition (please see the discussed topics above), and developing a plan to further investigate it; she had a number of questions  which I addressed.

## 2017-07-13 NOTE — Assessment & Plan Note (Addendum)
Patient is not currently on a statin. She is due for a FLP.  I am sure that she will be hesitant to start a statin due to her myofascial pain.  We will discuss after reviewing her next labs.

## 2017-07-14 ENCOUNTER — Encounter: Payer: Self-pay | Admitting: Family Medicine

## 2017-07-14 ENCOUNTER — Ambulatory Visit: Payer: 59 | Admitting: Family Medicine

## 2017-07-14 VITALS — BP 128/86 | HR 97 | Temp 97.9°F | Ht 63.0 in | Wt 227.4 lb

## 2017-07-14 DIAGNOSIS — M1712 Unilateral primary osteoarthritis, left knee: Secondary | ICD-10-CM | POA: Diagnosis not present

## 2017-07-14 DIAGNOSIS — J4540 Moderate persistent asthma, uncomplicated: Secondary | ICD-10-CM

## 2017-07-14 DIAGNOSIS — E039 Hypothyroidism, unspecified: Secondary | ICD-10-CM | POA: Diagnosis not present

## 2017-07-14 DIAGNOSIS — M7918 Myalgia, other site: Secondary | ICD-10-CM | POA: Diagnosis not present

## 2017-07-14 DIAGNOSIS — F418 Other specified anxiety disorders: Secondary | ICD-10-CM | POA: Diagnosis not present

## 2017-07-14 DIAGNOSIS — M5441 Lumbago with sciatica, right side: Secondary | ICD-10-CM

## 2017-07-14 DIAGNOSIS — G8929 Other chronic pain: Secondary | ICD-10-CM

## 2017-07-14 DIAGNOSIS — E782 Mixed hyperlipidemia: Secondary | ICD-10-CM

## 2017-07-14 DIAGNOSIS — G43109 Migraine with aura, not intractable, without status migrainosus: Secondary | ICD-10-CM

## 2017-07-14 DIAGNOSIS — J309 Allergic rhinitis, unspecified: Secondary | ICD-10-CM | POA: Diagnosis not present

## 2017-07-14 DIAGNOSIS — M5412 Radiculopathy, cervical region: Secondary | ICD-10-CM | POA: Insufficient documentation

## 2017-07-14 DIAGNOSIS — I10 Essential (primary) hypertension: Secondary | ICD-10-CM | POA: Diagnosis not present

## 2017-07-14 DIAGNOSIS — E559 Vitamin D deficiency, unspecified: Secondary | ICD-10-CM | POA: Diagnosis not present

## 2017-07-14 DIAGNOSIS — M3500 Sicca syndrome, unspecified: Secondary | ICD-10-CM

## 2017-07-14 DIAGNOSIS — I5022 Chronic systolic (congestive) heart failure: Secondary | ICD-10-CM | POA: Diagnosis not present

## 2017-07-14 DIAGNOSIS — H5440 Blindness, one eye, unspecified eye: Secondary | ICD-10-CM

## 2017-07-14 LAB — COMPREHENSIVE METABOLIC PANEL
ALT: 21 U/L (ref 0–35)
AST: 18 U/L (ref 0–37)
Albumin: 3.9 g/dL (ref 3.5–5.2)
Alkaline Phosphatase: 97 U/L (ref 39–117)
BUN: 8 mg/dL (ref 6–23)
CO2: 25 mEq/L (ref 19–32)
Calcium: 9.4 mg/dL (ref 8.4–10.5)
Chloride: 109 mEq/L (ref 96–112)
Creatinine, Ser: 0.89 mg/dL (ref 0.40–1.20)
GFR: 69.39 mL/min (ref >60.00)
Glucose, Bld: 96 mg/dL (ref 70–99)
Potassium: 3.6 mEq/L (ref 3.5–5.1)
Sodium: 143 mEq/L (ref 135–145)
Total Bilirubin: 0.4 mg/dL (ref 0.2–1.2)
Total Protein: 6.4 g/dL (ref 6.0–8.3)

## 2017-07-14 LAB — C-REACTIVE PROTEIN: CRP: 0.3 mg/dL — ABNORMAL LOW (ref 0.5–20.0)

## 2017-07-14 LAB — SEDIMENTATION RATE: Sed Rate: 2 mm/hr (ref 0–30)

## 2017-07-14 LAB — LIPID PANEL
Cholesterol: 140 mg/dL (ref 0–200)
HDL: 43 mg/dL (ref >39.00)
LDL Cholesterol: 61 mg/dL (ref 0–99)
NonHDL: 97.43
Total CHOL/HDL Ratio: 3
Triglycerides: 181 mg/dL — ABNORMAL HIGH (ref 0.0–149.0)
VLDL: 36.2 mg/dL (ref 0.0–40.0)

## 2017-07-14 LAB — CBC WITH DIFFERENTIAL/PLATELET
Basophils Absolute: 0 10*3/uL (ref 0.0–0.1)
Basophils Relative: 0.3 % (ref 0.0–3.0)
Eosinophils Absolute: 0.5 10*3/uL (ref 0.0–0.7)
Eosinophils Relative: 6.7 % — ABNORMAL HIGH (ref 0.0–5.0)
HCT: 39 % (ref 36.0–46.0)
Hemoglobin: 13.3 g/dL (ref 12.0–15.0)
Lymphocytes Relative: 27.8 % (ref 12.0–46.0)
Lymphs Abs: 1.9 10*3/uL (ref 0.7–4.0)
MCHC: 34.2 g/dL (ref 30.0–36.0)
MCV: 92.1 fl (ref 78.0–100.0)
Monocytes Absolute: 0.4 10*3/uL (ref 0.1–1.0)
Monocytes Relative: 6.5 % (ref 3.0–12.0)
Neutro Abs: 4 10*3/uL (ref 1.4–7.7)
Neutrophils Relative %: 58.7 % (ref 43.0–77.0)
Platelets: 244 10*3/uL (ref 150.0–400.0)
RBC: 4.23 Mil/uL (ref 3.87–5.11)
RDW: 12.8 % (ref 11.5–15.5)
WBC: 6.8 10*3/uL (ref 4.0–10.5)

## 2017-07-14 MED ORDER — VORTIOXETINE HBR 5 MG PO TABS: 5.0000 mg | ORAL_TABLET | Freq: Every day | ORAL | 3 refills | Status: DC

## 2017-07-14 MED ORDER — LIRAGLUTIDE -WEIGHT MANAGEMENT 18 MG/3ML ~~LOC~~ SOPN: PEN_INJECTOR | SUBCUTANEOUS | 0 refills | Status: DC

## 2017-07-14 MED ORDER — VORTIOXETINE HBR 5 MG PO TABS: 5.0000 mg | ORAL_TABLET | Freq: Every day | ORAL | 0 refills | Status: DC

## 2017-07-14 NOTE — Assessment & Plan Note (Signed)
She continues to tolerate the low-dose of Saxenda.  Weight continues to lower.  Exercise has been a struggle.  She is working on making healthy food decisions.

## 2017-07-14 NOTE — Assessment & Plan Note (Signed)
Followed by Dr. Danielle Dess.  She has been diagnosed with C6 and C7 pathology.  She does have bilateral hand numbness and tingling almost daily.  She states that she has been evaluated for carpal tunnel on 2 different occasions and was told that she does not have carpal tunnel.

## 2017-07-14 NOTE — Assessment & Plan Note (Signed)
This sweet patient is been working very hard undergo physical therapy for her chronic pain issues.  Unfortunately, she has decompensated somewhat and feels increased full body pain.  I did receive a note from physical therapy stating that this was worse after the patient had a misstep during her physical therapy.  Patient states that this is actually more her norm.  She "feels guarded all the time." She states that anytime her family members took her, she feels pain.  Recent thyroid, B12, TSH labs were normal.  He states that he also ordered a sleep study, but she does not want to go to have it completed.  She would be okay with home sleep study but also states that she would never wear CPAP.  She has had a sleep study in the past that did show restless leg syndrome and that she has a REM disorder.  This was done many years ago.  We will hold off on further PT for a few weeks.  We discussed working on her ongoing depression with a new medication.  With a recheck in 4 weeks to see progress and making a plan from there.

## 2017-07-14 NOTE — Assessment & Plan Note (Signed)
Patient is interested in seeing a rheumatologist at [facility].  States that this rheumatologist actually suffers from Sjogren's.  I will happily order that referral, but I believe the patient will need to call.

## 2017-07-14 NOTE — Assessment & Plan Note (Addendum)
Decompensating.  Patient has tried Cymbalta and Prozac in the past.  She states that she was put on these medications for headaches.  She is currently taking Wellbutrin only.  We discussed medication options and will trial treatment Trintellix.  She does have an appointment to see a counselor on 07/25/2017. Patient was given information on SSRIs and possible side effects were reviewed. She was asked to contact us with any worsening in symptoms or suicidal thoughts and we discussed that it would take 2-4 weeks to begin to see improvement in her symptoms.  Recheck in 4 weeks.

## 2017-07-14 NOTE — Assessment & Plan Note (Signed)
Migraines have improved since starting the Topamax.  She is now down to 1-2 migraines per week.  She is on 25 mg p.o. daily.  We discussed the possibility of increasing that dose in the near future.  I like to hold since we are trialing a different medication today.

## 2017-07-14 NOTE — Assessment & Plan Note (Signed)
Most recent TSH was ordered by her rheumatologist.  This was reviewed with the patient and is at goal.

## 2017-07-14 NOTE — Assessment & Plan Note (Signed)
Patient has been seen Dr. Danielle Dess.  He has been helping to manage her lumbar pain.  She would like to continue with physical therapy and medication therapy as well as injections.  Her next appointment with him is on 07/24/2017.  States that stretching has been very helpful in keeping her mobile.

## 2017-07-16 ENCOUNTER — Telehealth: Payer: Self-pay | Admitting: Family Medicine

## 2017-07-16 ENCOUNTER — Other Ambulatory Visit: Payer: Self-pay | Admitting: Physician Assistant

## 2017-07-16 NOTE — Telephone Encounter (Signed)
Copied from CRM [id number]. Topic: Quick Communication - Rx Refill/Question >> Jul 16, 2017  4:29 PM Arlyss Gandy, NT wrote: Medication: Mobic, Levothyroxine, and Omeprazole   Has the patient contacted their pharmacy? Yes.     (Agent: If no, request that the patient contact the pharmacy for the refill.)   Preferred Pharmacy (with phone number or street name): [facility] in [place]   Agent: Please be advised that RX refills may take up to 3 business days. We ask that you follow-up with your pharmacy.

## 2017-07-16 NOTE — Telephone Encounter (Signed)
Refill Request.  

## 2017-07-16 NOTE — Telephone Encounter (Signed)
Left a message for the patient to call back about her Losartan medication. Last OV states patient is not taking the medication was discontinued.

## 2017-07-17 ENCOUNTER — Other Ambulatory Visit: Payer: Self-pay

## 2017-07-17 MED ORDER — OMEPRAZOLE 40 MG PO CPDR: DELAYED_RELEASE_CAPSULE | ORAL | 1 refills | Status: DC

## 2017-07-17 MED ORDER — LEVOTHYROXINE SODIUM 88 MCG PO TABS: 88.0000 ug | ORAL_TABLET | Freq: Every day | ORAL | 2 refills | Status: DC

## 2017-07-17 MED ORDER — MELOXICAM 7.5 MG PO TABS: 7.5000 mg | ORAL_TABLET | Freq: Every day | ORAL | 2 refills | Status: DC

## 2017-07-17 NOTE — Telephone Encounter (Signed)
Refill Request.  

## 2017-07-18 ENCOUNTER — Ambulatory Visit: Payer: Self-pay | Admitting: Rehabilitative and Restorative Service Providers"

## 2017-07-25 ENCOUNTER — Ambulatory Visit: Payer: Self-pay | Admitting: Rehabilitative and Restorative Service Providers"

## 2017-07-25 ENCOUNTER — Ambulatory Visit: Payer: 59 | Admitting: Psychology

## 2017-07-25 DIAGNOSIS — F331 Major depressive disorder, recurrent, moderate: Secondary | ICD-10-CM | POA: Diagnosis not present

## 2017-07-30 ENCOUNTER — Ambulatory Visit: Payer: Self-pay | Admitting: Rehabilitative and Restorative Service Providers"

## 2017-07-31 ENCOUNTER — Encounter: Payer: Self-pay | Admitting: Rehabilitative and Restorative Service Providers"

## 2017-08-07 ENCOUNTER — Ambulatory Visit: Payer: Self-pay | Admitting: Rehabilitative and Restorative Service Providers"

## 2017-08-08 ENCOUNTER — Ambulatory Visit: Payer: 59 | Admitting: Psychology

## 2017-08-08 DIAGNOSIS — F331 Major depressive disorder, recurrent, moderate: Secondary | ICD-10-CM

## 2017-08-11 ENCOUNTER — Encounter: Payer: Self-pay | Admitting: Family Medicine

## 2017-08-11 ENCOUNTER — Ambulatory Visit: Payer: 59 | Admitting: Family Medicine

## 2017-08-11 VITALS — BP 130/82 | HR 96 | Temp 98.6°F | Ht 63.0 in | Wt 229.2 lb

## 2017-08-11 DIAGNOSIS — E782 Mixed hyperlipidemia: Secondary | ICD-10-CM | POA: Diagnosis not present

## 2017-08-11 DIAGNOSIS — G8929 Other chronic pain: Secondary | ICD-10-CM

## 2017-08-11 DIAGNOSIS — M5441 Lumbago with sciatica, right side: Secondary | ICD-10-CM

## 2017-08-11 DIAGNOSIS — R2689 Other abnormalities of gait and mobility: Secondary | ICD-10-CM

## 2017-08-11 DIAGNOSIS — M3500 Sicca syndrome, unspecified: Secondary | ICD-10-CM | POA: Diagnosis not present

## 2017-08-11 DIAGNOSIS — G43109 Migraine with aura, not intractable, without status migrainosus: Secondary | ICD-10-CM | POA: Diagnosis not present

## 2017-08-11 LAB — COMPREHENSIVE METABOLIC PANEL
ALT: 23 U/L (ref 0–35)
AST: 23 U/L (ref 0–37)
Albumin: 4 g/dL (ref 3.5–5.2)
Alkaline Phosphatase: 95 U/L (ref 39–117)
BUN: 12 mg/dL (ref 6–23)
CO2: 27 mEq/L (ref 19–32)
Calcium: 9.3 mg/dL (ref 8.4–10.5)
Chloride: 108 mEq/L (ref 96–112)
Creatinine, Ser: 0.99 mg/dL (ref 0.40–1.20)
GFR: 61.35 mL/min (ref >60.00)
Glucose, Bld: 93 mg/dL (ref 70–99)
Potassium: 4.2 mEq/L (ref 3.5–5.1)
Sodium: 141 mEq/L (ref 135–145)
Total Bilirubin: 0.5 mg/dL (ref 0.2–1.2)
Total Protein: 6.5 g/dL (ref 6.0–8.3)

## 2017-08-11 NOTE — Addendum Note (Signed)
Addended by: Felix Ahmadi A on: 08/11/2017 01:53 PM   Modules accepted: Orders

## 2017-08-11 NOTE — Progress Notes (Signed)
Kristine Curtis is a 57 y.o. female is here for follow up.  History of Present Illness:   [name], CMA acting as scribe for [name].   ZOX:WRUEAV in today for follow up. She was not able to take Saxenda or Trintellix due to [place] and vomiting. Started Trintellix and after 5 days she progressively got worse. She had nausea and vomiting and after a few days started having some palpitations so she stopped. After she stopped the Saxenda she noticed decrease in anxiety and depression. She also noticed that rash on her legs have gone away. She is feeling much better and both have been added to her allergies list. The last day that she had Saxenda was July 18, 2017.   She will start back on home exercises soon. She would like to start back on PT after she feels like she has the mobility to do so. Her Gait is still concerning to her. She does not see neurology. She is still having numbness in hands and arms. She would like to have referral.   Hyperlipidemia, with high triglycerides and low HDL, not on statin Patient is not currently on a statin. She is due for a FLP.  I am sure that she will be hesitant to start a statin due to her myofascial pain.    Depression with anxiety Improving she is not taking treatment Telex or Saxenda.  Morbid obesity (HCC) Exercise has been a struggle.  She is working on making healthy food decisions.  Sjogren's disease [facility]) Patient is interested in seeing a rheumatologist at [facility].  States that this rheumatologist actually suffers from Sjogren's.  I will happily order that referral, but I believe the patient will need to call. Patient Has heard from referral that we placed  But they are holding for records from another office in order to make appointment. They are several months out for new patient appointment.   Classical migraine without intractable migraine Migraines have improved since starting the Topamax.  She is now down to 1-2 migraines  per week.  She is on 25 mg p.o. daily.  We discussed the possibility of increasing that dose in the near future.  I like to hold since we are trialing a different medication today.  Chronic low back pain with sciatica Patient has been seen Dr. Danielle Dess.  He has been helping to manage her lumbar pain.  She would like to continue with physical therapy and medication therapy as well as injections. States that stretching has been very helpful in keeping her mobile.  Cervical radiculopathy Followed by Dr. Danielle Dess.  She has been diagnosed with C6 and C7 pathology.  She does have bilateral hand numbness and tingling almost daily.  She states that she has been evaluated for carpal tunnel on 2 different occasions and was told that she does not have carpal tunnel.  Depression screen [facility] 2/9 08/11/2017 06/09/2017  Decreased Interest 1 0  Down, Depressed, Hopeless 1 0  PHQ - 2 Score 2 0  Altered sleeping 0 3  Tired, decreased energy 1 2  Change in appetite 0 0  Feeling bad or failure about yourself  0 0  Trouble concentrating 3 3  Moving slowly or fidgety/restless 0 0  Suicidal thoughts - 0  PHQ-9 Score 6 8  Difficult doing work/chores Somewhat difficult -   PMHx, SurgHx, SocialHx, FamHx, Medications, and Allergies were reviewed in the Visit Navigator and updated as appropriate.   Patient Active Problem List  Diagnosis Date Noted  . Myofascial pain dysfunction syndrome 07/14/2017  . Blind right eye 07/14/2017  . Cervical radiculopathy 07/14/2017  . Degenerative arthritis of left knee, XRAY12/2018 07/13/2017  . Dizziness 03/01/2017  . Morbid obesity (HCC) 02/24/2017  . Sjogren's disease (HCC) 02/24/2017  . Dense breasts 02/03/2017  . ETD (eustachian tube dysfunction) 01/21/2017  . Fatigue 01/21/2017  . Failed back syndrome of lumbar spine 12/30/2016  . Chronic low back pain with sciatica 05/02/2015  . Chronic systolic congestive heart failure (HCC), EF 35-40% 03/22/2015  . Sicca (HCC)  11/22/2014  . Family history of polycystic kidney 11/16/2012  . Vitamin D deficiency, on daily vitamin D 10/09/2009  . Hyperlipidemia, with high triglycerides and low HDL, not on statin 11/15/2008  . Depression with anxiety 08/31/2008  . Essential hypertension, on Losartan, Coreg, and Spironolactone 08/31/2008  . Allergic rhinitis, on Flonase 08/31/2008  . Asthma, Symbicort 08/31/2008  . GERD 08/31/2008  . Osteoarthritis 08/31/2008  . Hypothyroidism, on Levothyroxine 08/30/2008  . Classical migraine without intractable migraine 08/30/2008  . Abnormal Pap smear of cervix 05/07/1995   Social History   Tobacco Use  . Smoking status: Former Smoker    Packs/day: 2.00    Years: 12.00    Pack years: 24.00    Types: Cigarettes    Last attempt to quit: 05/06/1989    Years since quitting: 28.2  . Smokeless tobacco: Never Used  . Tobacco comment: quit smoking 1990  Substance Use Topics  . Alcohol use: No    Alcohol/week: 0.0 oz  . Drug use: No   Current Medications and Allergies:   Current Outpatient Medications:  .  acetaminophen (TYLENOL) 500 MG tablet, Take 1,000 mg by mouth every 4 (four) hours as needed for moderate pain or headache., Disp: , Rfl:  .  Aspirin-Acetaminophen-Caffeine (EXCEDRIN EXTRA STRENGTH PO), Take 2 tablets by mouth 2 (two) times daily as needed (headaches)., Disp: , Rfl:  .  buPROPion (WELLBUTRIN XL) 300 MG 24 hr tablet, TAKE 1 TABLET BY MOUTH DAILY, Disp: 30 tablet, Rfl: 11 .  carvedilol (COREG) 25 MG tablet, Take 1/2 tablet ( 12.5 mg ) twice a day, Disp: 60 tablet, Rfl: 6 .  Cholecalciferol (VITAMIN D-3) 5000 UNITS TABS, Take 5,000 Units by mouth daily. , Disp: , Rfl:  .  diazepam (VALIUM) 5 MG tablet, TAKE ONE (1) TABLET BY MOUTH EVERY SIX HOURS AS NEEDED, Disp: 30 tablet, Rfl: 3 .  fluticasone (FLONASE) 50 MCG/ACT nasal spray, Place 1 spray daily into both nostrils., Disp: 16 g, Rfl: 3 .  furosemide (LASIX) 20 MG tablet, TAKE 1 TABLET BY MOUTH DAILY, Disp: 90  tablet, Rfl: 2 .  hydroxychloroquine (PLAQUENIL) 200 MG tablet, Take 400 mg by mouth every morning. , Disp: , Rfl:  .  levothyroxine (SYNTHROID, LEVOTHROID) 88 MCG tablet, Take 1 tablet (88 mcg total) by mouth daily., Disp: 30 tablet, Rfl: 2 .  losartan (COZAAR) 50 MG tablet, Take 50 mg by mouth daily. 1/2 tab bid, Disp: , Rfl:  .  meloxicam (MOBIC) 7.5 MG tablet, Take 1 tablet (7.5 mg total) by mouth daily., Disp: 30 tablet, Rfl: 2 .  mometasone (ASMANEX) 220 MCG/INH inhaler, Inhale 2 puffs into the lungs daily., Disp: 1 Inhaler, Rfl: 0 .  nystatin ointment (MYCOSTATIN), Apply 1 application topically 2 (two) times daily., Disp: 30 g, Rfl: 0 .  omeprazole (PRILOSEC) 40 MG capsule, TAKE ONE (1) CAPSULE BY MOUTH  BEFORE BREAKFAST, Disp: 30 capsule, Rfl: 1 .  ONE TOUCH ULTRA  TEST test strip, USE AS DIRECTED TO CHECK BLOOD SUGAR ONCE A DAY, Disp: 100 each, Rfl: 3 .  OVER THE COUNTER MEDICATION, Take 5 capsules by mouth daily. Ariix Optimals Vitamin & Minerals , Disp: , Rfl:  .  OVER THE COUNTER MEDICATION, Take 1 scoop by mouth daily. Ariix Magnecal D , Disp: , Rfl:  .  oxyCODONE-acetaminophen (PERCOCET/ROXICET) 5-325 MG tablet, Take 1 tablet by mouth every 8 (eight) hours as needed for severe pain., Disp: 60 tablet, Rfl: 0 .  spironolactone (ALDACTONE) 25 MG tablet, TAKE ONE TABLET BY MOUTH EVERY DAY, Disp: 90 tablet, Rfl: 1 .  topiramate (TOPAMAX) 25 MG tablet, Take 1 tablet (25 mg total) by mouth 2 (two) times daily., Disp: 60 tablet, Rfl: 3 .  triamcinolone ointment (KENALOG) 0.5 %, Apply 1 application topically 2 (two) times daily., Disp: 30 g, Rfl: 0 .  Wheat Dextrin (BENEFIBER) POWD, Take 2 scoop by mouth daily as needed (for constipation). , Disp: , Rfl:  .  cyclobenzaprine (FLEXERIL) 5 MG tablet, Take 1 tablet by mouth daily., Disp: , Rfl:    Allergies  Allergen Reactions  . Gabapentin (Once-Daily)     Depression   . Saxenda [Liraglutide -Weight Management] Other (See Comments)     Depression   . Sulfa Antibiotics     itching  . Trintellix [Vortioxetine] Other (See Comments)    GI issues   . Adhesive [Tape] Rash  . Codeine Itching   Review of Systems   Pertinent items are noted in the HPI. Otherwise, ROS is negative.  Vitals:   Vitals:   08/11/17 0800  BP: 130/82  Pulse: 96  Temp: 98.6 F (37 C)  TempSrc: Oral  SpO2: 94%  Weight: 229 lb 3.2 oz (104 kg)  Height: 5\' 3"  (1.6 m)     Body mass index is 40.6 kg/m.   Physical Exam:   Physical Exam  Constitutional: She is oriented to person, place, and time. She appears well-developed and well-nourished. No distress.  HENT:  Head: Normocephalic and atraumatic.  Left Ear: External ear normal.  Nose: Nose normal.  Mouth/Throat: Oropharynx is clear and moist.  Eyes: Pupils are equal, round, and reactive to light. Conjunctivae and EOM are normal.  Neck: Normal range of motion. Neck supple.  Cardiovascular: Normal rate, regular rhythm and intact distal pulses.  Pulmonary/Chest: Effort normal and breath sounds normal.  Abdominal: Soft. Bowel sounds are normal.  Neurological: She is alert and oriented to person, place, and time.  Skin: Skin is warm.  Psychiatric: She has a normal mood and affect. Her behavior is normal.  Nursing note and vitals reviewed.   Assessment and Plan:   Hyperlipidemia, with high triglycerides and low HDL, not on statin Patient is not currently on a statin. She is due for a FLP.  I am sure that she will be hesitant to start a statin due to her myofascial pain.    Depression with anxiety Improving she is not taking treatment Telex or Saxenda.  Morbid obesity (HCC) Exercise has been a struggle.  She is working on making healthy food decisions.  Sjogren's disease [facility]) Patient is interested in seeing a rheumatologist at [facility].  States that this rheumatologist actually suffers from Sjogren's.  I will happily order that referral, but I believe the patient will need to call.  Patient Has heard from referral that we placed  But they are holding for records from another office in order to make appointment. They are several months out for  new patient appointment.   Classical migraine without intractable migraine Migraines have improved since starting the Topamax.  She is now down to 1-2 migraines per week.  She is on 25 mg p.o. daily.  We discussed the possibility of increasing that dose in the near future.  I like to hold since we are trialing a different medication today.  Chronic low back pain with sciatica Patient has been seen Dr. Danielle Dess.  He has been helping to manage her lumbar pain.  She would like to continue with physical therapy and medication therapy as well as injections. States that stretching has been very helpful in keeping her mobile.  Cervical radiculopathy Followed by Dr. Danielle Dess.  She has been diagnosed with C6 and C7 pathology.  She does have bilateral hand numbness and tingling almost daily.  She states that she has been evaluated for carpal tunnel on 2 different occasions and was told that she does not have carpal tunnel.  Imbalance To Neurology.  . Reviewed expectations re: course of current medical issues. . Discussed self-management of symptoms. . Outlined signs and symptoms indicating need for more acute intervention. . Patient verbalized understanding and all questions were answered. Marland Kitchen Health Maintenance issues including appropriate healthy diet, exercise, and smoking avoidance were discussed with patient. . See orders for this visit as documented in the electronic medical record. . Patient received an After Visit Summary.  Helane Rima, DO [place] [place] 08/11/2017  Future Appointments  Date Time Provider Department Center  08/21/2017  4:00 PM Marlinda Mike, [place] [facility] None  09/05/2017 10:00 AM Bauert, Rella Larve, LCSW [facility] None  11/10/2017  8:00 AM Helane Rima, DO [facility]

## 2017-08-18 ENCOUNTER — Other Ambulatory Visit: Payer: Self-pay

## 2017-08-18 DIAGNOSIS — H6983 Other specified disorders of Eustachian tube, bilateral: Secondary | ICD-10-CM

## 2017-08-18 MED ORDER — FLUTICASONE PROPIONATE 50 MCG/ACT NA SUSP: 1.0000 | Freq: Every day | NASAL | 3 refills | Status: DC

## 2017-08-21 ENCOUNTER — Ambulatory Visit (INDEPENDENT_AMBULATORY_CARE_PROVIDER_SITE_OTHER): Payer: 59 | Admitting: Psychology

## 2017-08-21 DIAGNOSIS — F331 Major depressive disorder, recurrent, moderate: Secondary | ICD-10-CM | POA: Diagnosis not present

## 2017-08-29 ENCOUNTER — Ambulatory Visit: Payer: 59 | Attending: Family Medicine | Admitting: Rehabilitative and Restorative Service Providers"

## 2017-08-29 ENCOUNTER — Encounter: Payer: Self-pay | Admitting: Rehabilitative and Restorative Service Providers"

## 2017-08-29 ENCOUNTER — Telehealth: Payer: Self-pay | Admitting: Family Medicine

## 2017-08-29 DIAGNOSIS — G8929 Other chronic pain: Secondary | ICD-10-CM

## 2017-08-29 DIAGNOSIS — M6281 Muscle weakness (generalized): Secondary | ICD-10-CM | POA: Insufficient documentation

## 2017-08-29 DIAGNOSIS — M5441 Lumbago with sciatica, right side: Principal | ICD-10-CM

## 2017-08-29 DIAGNOSIS — R2681 Unsteadiness on feet: Secondary | ICD-10-CM | POA: Diagnosis present

## 2017-08-29 DIAGNOSIS — R2689 Other abnormalities of gait and mobility: Secondary | ICD-10-CM | POA: Diagnosis not present

## 2017-08-29 DIAGNOSIS — R293 Abnormal posture: Secondary | ICD-10-CM

## 2017-08-29 DIAGNOSIS — M5412 Radiculopathy, cervical region: Secondary | ICD-10-CM

## 2017-08-29 NOTE — Therapy (Signed)
[facility] [address] [place], [place], [address] Phone: [phone]   Fax:  [phone]  Physical Therapy Treatment  Patient Details  Name: Kristine Curtis MRN: [id number] Date of Birth: 06-Apr-1961 Referring Provider: Barnett Abu, MD   Encounter Date: 08/29/2017  PT End of Session - 08/29/17 0950    Visit Number  1    Number of Visits  6    Date for PT Re-Evaluation  10/28/17    Authorization Type  [employer] private insurance (60 visit limit combined PT/OT/ST)    PT Start Time  [id number]    PT Stop Time  0935    PT Time Calculation (min)  43 min    Activity Tolerance  Patient tolerated treatment well    Behavior During Therapy  [facility] for tasks assessed/performed       Past Medical History:  Diagnosis Date  . Allergic rhinitis due to pollen   . Asthma   . Chronic combined systolic and diastolic CHF (congestive heart failure) (HCC)    06/2016 Echo: EF 40-45%, Gr1 DD  . Depression   . Family history of polycystic kidney with negative CT 11/16/2012  . Fibromyalgia   . GERD (gastroesophageal reflux disease)   . Hypertension   . Hypothyroidism   . IBS (irritable bowel syndrome)   . Internal hemorrhoids   . Morbid obesity (HCC)   . NICM (nonischemic cardiomyopathy) (HCC)    a. 1999 Cath: nl cors;  b. EF prev as low as 35%;  c. 11/2015 Echo: Ef 40-45%;  d. 06/2016 Echo: EF 40-45%;  e. Lexiscan MV: fixed anterosepta/inferseptal defect w/o ischemia, EF 35%.  . Osteoarthritis   . PVC (premature ventricular contraction)   . Restless leg syndrome   . Sicca (HCC) 11/22/2014  . Vertigo     Past Surgical History:  Procedure Laterality Date  . ABDOMINAL HYSTERECTOMY    . CESAREAN SECTION    . CHOLECYSTECTOMY  10-2008  . COLONOSCOPY    . ESOPHAGOGASTRODUODENOSCOPY    . EYE SURGERY     2 2013 and 1981/DCR of right eye 05/2014  . LUMBAR DISC SURGERY  2016   L3-4  . RIGHT/LEFT HEART CATH AND CORONARY ANGIOGRAPHY N/A 07/11/2016    Procedure: Right/Left Heart Cath and Coronary Angiography;  Surgeon: Peter M Swaziland, MD;  Location: [facility] INVASIVE CV LAB;  Service: Cardiovascular;  Laterality: N/A;  . SHOULDER ARTHROSCOPY W/ SUBACROMIAL DECOMPRESSION AND DISTAL CLAVICLE EXCISION Right   . SPINE SURGERY     C6-C7, 03/2017  . TUBAL LIGATION  1992    There were no vitals filed for this visit.  Subjective Assessment - 08/29/17 0855    Subjective  The patient was placed on hold in PT on 07/14/17 due to worsening low back pain.  She saw Dr. Danielle Dess and he recommended she hold off on further injections.  He wrote order to resume therapy after evaluating her.    The patient has also sought accupuncture for pain management.    The patient is scheduled to see neurology 5/20 with Dr. [name] with [facility].  She is doing daily exercise routine. "My balance stuff is weird."  She notes she gets an eye stabbing headache and this creates problems with spatial awareness (this occurs 3-4 times/month and lasts for 2-3 days).   "I feel like I'm doing good, but I have these lingering issues.  I feel like I can't bridge from where I am to the next level."  Pertinent History  systolic and diastolic heart failure, history of PACs and PVCs by Holter monitor, fibromyalgia, restless leg syndrome, hypertension, depression, skin rash persistent.    Patient Stated Goals  "figure out how to stretch my low back as much as I can." Work on balance and continue general strengthening.    Currently in Pain?  Yes    Pain Score  5     Pain Location  Back    Pain Radiating Towards  Radiates into bilateral SI region and radiates laterally around the hips.  By evening, she gets numbness and tingling in legs and feet but "it's nothing like it was when I was here".    Pain Onset  More than a month ago    Pain Frequency  Constant    Aggravating Factors   "pushing my muscles"    Pain Relieving Factors  accupuncture         [facility] PT Assessment - 08/29/17 0906       Assessment   Medical Diagnosis  lumbar radiculopathy    Referring Provider  Barnett Abu, MD    Onset Date/Surgical Date  -- 01/2017, 07/2017 worsened    Prior Therapy  prior therapy for back at other clinics, known to our clinic from recent plan of care *re-evaluation today      Precautions   Precautions  Fall      Restrictions   Weight Bearing Restrictions  No      Balance Screen   Has the patient fallen in the past 6 months  No    How many times?  *no falls, but sometimes steps wrong off steps and this increases pain.    Has the patient had a decrease in activity level because of a fear of falling?   Yes varies due to chronic pain    Is the patient reluctant to leave their home because of a fear of falling?   No      Home Environment   Living Environment  Private residence    Living Arrangements  Spouse/significant other    Type of Home  House    Home Access  Stairs to enter    Entrance Stairs-Number of Steps  3    Entrance Stairs-Rails  None    Home Layout  One level      Prior Function   Level of Independence  Independent      Posture/Postural Control   Posture/Postural Control  Postural limitations    Posture Comments  Patinet maintaining improved cervical position.  She has chronic thoracic kyphosis.      ROM / Strength   AROM / PROM / Strength  AROM;Strength      AROM   Overall AROM   Within functional limits for tasks performed      Strength   Overall Strength Comments  Bilateral hip flexion 3/5, bilateral knee extension 4+/5, bilateral knee flexion 5/5, bilateral ankle DF is 4/5, R hip abduction 3+/5, L hip abduction 4/5.      Palpation   Palpation comment  Tender to palpation along bilateral sacral borders and low back + quadratus lumborum.                     [facility] Adult PT Treatment/Exercise - 08/29/17 0906      Exercises   Exercises  Other Exercises    Other Exercises   Seated piriformis stretch with one leg off edge of mat (to do off edge  of bed at  home), and prone hip ER *tightness noted- will work to provide activities for home.             PT Education - 08/29/17 0930    Education provided  Yes    Education Details  modified piriformis stretch    Person(s) Educated  Patient    Methods  Explanation;Demonstration;Handout    Comprehension  Verbalized understanding;Returned demonstration       PT Short Term Goals - 08/29/17 0951      PT SHORT TERM GOAL #1   Title  The patient will be indep with HEP for deep hip stretching, low back stretching, core stability, general conditioning, and balance.    Time  4    Period  Weeks    Target Date  09/28/17      PT SHORT TERM GOAL #2   Title  The patient will improve LE strength for bilateral hip flexion to 4/5.    Baseline  3/5 at re-evaluation    Time  4    Period  Weeks    Target Date  09/28/17      PT SHORT TERM GOAL #3   Title  The patient will improve R hip abduction to 4/5 strength.    Time  4    Period  Weeks    Target Date  09/28/17        PT Long Term Goals - 08/29/17 0952      PT LONG TERM GOAL #1   Title  The patient will verbalize understanding of progression of HEP/community wellness for long term participation in exercise.    Time  6    Period  Weeks    Target Date  10/28/17      PT LONG TERM GOAL #2   Title  The patient will verbalize understanding of chronic pain management for long term mgmt of symptoms.    Time  6    Period  Weeks    Target Date  10/28/17            Plan - 08/29/17 0953    Clinical Impression Statement  The patient returned today for PT re-evaluation after being on hold since 07/14/2017 due to worsening radiating symptoms.  Overall, her condition has improved and she is moving in a less guarded way and has returned to daily stretching routine.  PT and patient discussed role of PT at this time in her functional mobility.   Our emphasis should be specific to addressing her desire for further low back/hip stretching,  further progression of balance program at home, and transitioning to community wellness program.  Patient agrees with plan of care at this time.    History and Personal Factors relevant to plan of care:  h/o chronic low back pain, fibromyalgia, migraines, chronic fatigue, depression    Clinical Presentation  Stable    Clinical Decision Making  Low    Rehab Potential  Good    PT Frequency  1x / week    PT Duration  6 weeks    PT Treatment/Interventions  ADLs/Self Care Home Management;Therapeutic exercise;Therapeutic activities;Manual techniques;Patient/family education;Gait training;Functional mobility training;Balance training;Neuromuscular re-education;Vestibular    PT Next Visit Plan  Check HEP, hip ER stretch, hip flexion/abduction strengthening.  Balance HEP components.  Begin community class?    Consulted and Agree with Plan of Care  Patient       Patient will benefit from skilled therapeutic intervention in order to improve the following deficits and impairments:  Abnormal gait,  Obesity, Decreased strength, Pain, Decreased balance, Difficulty walking, Postural dysfunction, Impaired flexibility  Visit Diagnosis: Other abnormalities of gait and mobility  Muscle weakness (generalized)  Abnormal posture  Unsteadiness on feet     Problem List Patient Active Problem List   Diagnosis Date Noted  . Myofascial pain dysfunction syndrome 07/14/2017  . Blind right eye 07/14/2017  . Cervical radiculopathy 07/14/2017  . Degenerative arthritis of left knee, XRAY12/2018 07/13/2017  . Dizziness 03/01/2017  . Morbid obesity (HCC) 02/24/2017  . Sjogren's disease (HCC) 02/24/2017  . Dense breasts 02/03/2017  . ETD (eustachian tube dysfunction) 01/21/2017  . Fatigue 01/21/2017  . Failed back syndrome of lumbar spine 12/30/2016  . Chronic low back pain with sciatica 05/02/2015  . Chronic systolic congestive heart failure (HCC), EF 35-40% 03/22/2015  . Sicca (HCC) 11/22/2014  . Family  history of polycystic kidney 11/16/2012  . Vitamin D deficiency, on daily vitamin D 10/09/2009  . Hyperlipidemia, with high triglycerides and low HDL, not on statin 11/15/2008  . Depression with anxiety 08/31/2008  . Essential hypertension, on Losartan, Coreg, and Spironolactone 08/31/2008  . Allergic rhinitis, on Flonase 08/31/2008  . Asthma, Symbicort 08/31/2008  . GERD 08/31/2008  . Osteoarthritis 08/31/2008  . Hypothyroidism, on Levothyroxine 08/30/2008  . Classical migraine without intractable migraine 08/30/2008  . Abnormal Pap smear of cervix 05/07/1995    Christoph Copelan, PT 08/29/2017, 9:55 AM  [place] [facility] [address] [place], [place], [address] Phone: [phone]   Fax:  [phone]  Name: Kristine Curtis MRN: [id number] Date of Birth: 04-Apr-1961

## 2017-08-29 NOTE — Telephone Encounter (Signed)
Copied from CRM [id number]. Topic: Quick Communication - Rx Refill/Question >> Aug 29, 2017 11:08 AM Alexander Bergeron B wrote: Medication: diazepam (VALIUM) 5 MG tablet [[id number]] , oxyCODONE-acetaminophen (PERCOCET/ROXICET) 5-325 MG tablet [[id number]]  Has the patient contacted their pharmacy? Yes.   (Agent: If no, request that the patient contact the pharmacy for the refill.) Preferred Pharmacy (with phone number or street [facility]): [facility] Agent: Please be advised that RX refills may take up to 3 business days. We ask that you follow-up with your pharmacy.

## 2017-08-30 NOTE — Telephone Encounter (Signed)
Pt requesting refill of Oxycodone-Acetaminophen 5-325mg , last filled on 06/09/17 #60 and Valium 5mg  tab # 30 last filled on 03/21/17 with 3 refills.  LOV:08/11/17 Dr. Earlene Plater  [facility]

## 2017-09-01 MED ORDER — OXYCODONE-ACETAMINOPHEN 5-325 MG PO TABS: 1.0000 | ORAL_TABLET | Freq: Three times a day (TID) | ORAL | 0 refills | Status: DC | PRN

## 2017-09-01 MED ORDER — DIAZEPAM 5 MG PO TABS
ORAL_TABLET | ORAL | 3 refills | Status: DC
Start: 2017-09-01 — End: 2017-09-02

## 2017-09-01 NOTE — Telephone Encounter (Signed)
Ok to fill 

## 2017-09-02 ENCOUNTER — Other Ambulatory Visit: Payer: Self-pay | Admitting: Family Medicine

## 2017-09-02 DIAGNOSIS — M5441 Lumbago with sciatica, right side: Principal | ICD-10-CM

## 2017-09-02 DIAGNOSIS — M5412 Radiculopathy, cervical region: Secondary | ICD-10-CM

## 2017-09-02 DIAGNOSIS — G8929 Other chronic pain: Secondary | ICD-10-CM

## 2017-09-02 MED ORDER — DIAZEPAM 5 MG PO TABS: ORAL_TABLET | ORAL | 3 refills | Status: DC

## 2017-09-02 MED ORDER — OXYCODONE-ACETAMINOPHEN 5-325 MG PO TABS: 1.0000 | ORAL_TABLET | Freq: Three times a day (TID) | ORAL | 0 refills | Status: DC | PRN

## 2017-09-02 NOTE — Telephone Encounter (Signed)
Pt is out of diazepam

## 2017-09-03 NOTE — Telephone Encounter (Signed)
Dr. Earlene Plater resent medication to the pharmacy.

## 2017-09-05 ENCOUNTER — Ambulatory Visit (INDEPENDENT_AMBULATORY_CARE_PROVIDER_SITE_OTHER): Payer: 59 | Admitting: Psychology

## 2017-09-05 DIAGNOSIS — F331 Major depressive disorder, recurrent, moderate: Secondary | ICD-10-CM

## 2017-09-08 ENCOUNTER — Ambulatory Visit: Payer: 59 | Attending: Family Medicine | Admitting: Rehabilitative and Restorative Service Providers"

## 2017-09-08 ENCOUNTER — Ambulatory Visit: Payer: 59 | Admitting: Family Medicine

## 2017-09-08 ENCOUNTER — Encounter: Payer: Self-pay | Admitting: Rehabilitative and Restorative Service Providers"

## 2017-09-08 DIAGNOSIS — R293 Abnormal posture: Secondary | ICD-10-CM | POA: Insufficient documentation

## 2017-09-08 DIAGNOSIS — R2681 Unsteadiness on feet: Secondary | ICD-10-CM | POA: Diagnosis present

## 2017-09-08 DIAGNOSIS — R2689 Other abnormalities of gait and mobility: Secondary | ICD-10-CM | POA: Diagnosis present

## 2017-09-08 DIAGNOSIS — M6281 Muscle weakness (generalized): Secondary | ICD-10-CM | POA: Diagnosis not present

## 2017-09-08 NOTE — Patient Instructions (Signed)
Side Stretch (Kneeling)    Inhaling, reach left arm up and over toward right side. Exhale and stretch. Hold position for ___ breaths. Exhale while bringing arm down to side. Repeat ___ times. Repeat on other side. Do ___ times per day.  Copyright  VHI. All rights reserved.  Access Code: [email or web address]  URL: [email or web address]  Date: 09/08/2017  Prepared by: Margretta Ditty   Exercises  Supine Lower Trunk Rotation - 3 reps - 30 hold - 1x daily - 7x weekly

## 2017-09-08 NOTE — Therapy (Signed)
[facility] [address] [place], [place], [address] Phone: [phone]   Fax:  [phone]  Physical Therapy Treatment  Patient Details  Name: Kristine Curtis MRN: [id number] Date of Birth: 12/10/1960 Referring Provider: Barnett Abu, MD   Encounter Date: 09/08/2017  PT End of Session - 09/08/17 1211    Visit Number  2    Number of Visits  6    Date for PT Re-Evaluation  10/28/17    Authorization Type  [employer] private insurance (60 visit limit combined PT/OT/ST)    PT Start Time  0850    PT Stop Time  0935    PT Time Calculation (min)  45 min    Activity Tolerance  Patient tolerated treatment well    Behavior During Therapy  [facility] for tasks assessed/performed       Past Medical History:  Diagnosis Date  . Allergic rhinitis due to pollen   . Asthma   . Chronic combined systolic and diastolic CHF (congestive heart failure) (HCC)    06/2016 Echo: EF 40-45%, Gr1 DD  . Depression   . Family history of polycystic kidney with negative CT 11/16/2012  . Fibromyalgia   . GERD (gastroesophageal reflux disease)   . Hypertension   . Hypothyroidism   . IBS (irritable bowel syndrome)   . Internal hemorrhoids   . Morbid obesity (HCC)   . NICM (nonischemic cardiomyopathy) (HCC)    a. 1999 Cath: nl cors;  b. EF prev as low as 35%;  c. 11/2015 Echo: Ef 40-45%;  d. 06/2016 Echo: EF 40-45%;  e. Lexiscan MV: fixed anterosepta/inferseptal defect w/o ischemia, EF 35%.  . Osteoarthritis   . PVC (premature ventricular contraction)   . Restless leg syndrome   . Sicca (HCC) 11/22/2014  . Vertigo     Past Surgical History:  Procedure Laterality Date  . ABDOMINAL HYSTERECTOMY    . CESAREAN SECTION    . CHOLECYSTECTOMY  10-2008  . COLONOSCOPY    . ESOPHAGOGASTRODUODENOSCOPY    . EYE SURGERY     2 2013 and 1981/DCR of right eye 05/2014  . LUMBAR DISC SURGERY  2016   L3-4  . RIGHT/LEFT HEART CATH AND CORONARY ANGIOGRAPHY N/A 07/11/2016   Procedure: Right/Left Heart Cath and Coronary Angiography;  Surgeon: Peter M Swaziland, MD;  Location: [facility] INVASIVE CV LAB;  Service: Cardiovascular;  Laterality: N/A;  . SHOULDER ARTHROSCOPY W/ SUBACROMIAL DECOMPRESSION AND DISTAL CLAVICLE EXCISION Right   . SPINE SURGERY     C6-C7, 03/2017  . TUBAL LIGATION  1992    There were no vitals filed for this visit.  Subjective Assessment - 09/08/17 0852    Subjective  Patient notes this time of day may not be best for appointments due to not up and moving well early this morning.   Patient wants to try exercise class @ 8:15 M, W, Th.   Neurology appointment 09/22/17 @ [facility] associated with [facility] (Dr. [name]).  The patient reports that the stretch we gave her in PT does not get her SI joint at home (maybe becuase of soft bed surface).     Pertinent History  systolic and diastolic heart failure, history of PACs and PVCs by Holter monitor, fibromyalgia, restless leg syndrome, hypertension, depression, skin rash persistent.    Patient Stated Goals  "figure out how to stretch my low back as much as I can." Work on balance and continue general strengthening.    Currently in Pain?  Yes  Pain Score  3  "very tight" feeling    Pain Location  Back    Pain Orientation  Lower    Pain Descriptors / Indicators  Tightness    Pain Type  Chronic pain    Pain Onset  More than a month ago    Pain Frequency  Constant    Aggravating Factors   pushing herself    Pain Relieving Factors  stretching, accupuncture                       [facility] Adult PT Treatment/Exercise - 09/08/17 0918      Self-Care   Self-Care  Other Self-Care Comments    Other Self-Care Comments   Discussed current stretching program and patient doing butterfly and straddle stretch in sitting for hip adductors.  She notes continuing daily stretching routine.  PT emphasized to patient to keep performing current program to maintain flexibility.   Also discussed using home massager for IT band  knots and quadriceps femoris knots.  Recommended gentle pressure, no pain.      Exercises   Exercises  Other Exercises    Other Exercises   Seated straddle stretch and butterfly stretch for hip adductors.  Sidelying quadratus lumborum stretch attempted on physioball (not safe for home) and then done in sidelying with leg off edge of mat.  Also performed thomas test stretch with emphasis on quadriceps lengthening.  Supine trunk rotation emphasizing wider foot position for hip IR and deep ER stretch.  Discussed prior ther ex established in PT and patient continuing.              PT Education - 09/08/17 1210    Education provided  Yes    Education Details  Hip internal rotation to stretch deep external rotators and quadratus lumborum stretch for home    Person(s) Educated  Patient    Methods  Explanation;Demonstration;Handout    Comprehension  Verbalized understanding       PT Short Term Goals - 08/29/17 0951      PT SHORT TERM GOAL #1   Title  The patient will be indep with HEP for deep hip stretching, low back stretching, core stability, general conditioning, and balance.    Time  4    Period  Weeks    Target Date  09/28/17      PT SHORT TERM GOAL #2   Title  The patient will improve LE strength for bilateral hip flexion to 4/5.    Baseline  3/5 at re-evaluation    Time  4    Period  Weeks    Target Date  09/28/17      PT SHORT TERM GOAL #3   Title  The patient will improve R hip abduction to 4/5 strength.    Time  4    Period  Weeks    Target Date  09/28/17        PT Long Term Goals - 08/29/17 0952      PT LONG TERM GOAL #1   Title  The patient will verbalize understanding of progression of HEP/community wellness for long term participation in exercise.    Time  6    Period  Weeks    Target Date  10/28/17      PT LONG TERM GOAL #2   Title  The patient will verbalize understanding of chronic pain management for long term mgmt of symptoms.    Time  6    Period  Weeks    Target Date  10/28/17            Plan - 09/08/17 1214    Clinical Impression Statement  The patient tolerated HEP modifications well today emphasizing deep hip and QL stretching.  Continue to STGs and LTGs.     PT Treatment/Interventions  ADLs/Self Care Home Management;Therapeutic exercise;Therapeutic activities;Manual techniques;Patient/family education;Gait training;Functional mobility training;Balance training;Neuromuscular re-education;Vestibular    PT Next Visit Plan  check HEP, hip flexion/abduction strengthening, community class    Consulted and Agree with Plan of Care  Patient       Patient will benefit from skilled therapeutic intervention in order to improve the following deficits and impairments:  Abnormal gait, Obesity, Decreased strength, Pain, Decreased balance, Difficulty walking, Postural dysfunction, Impaired flexibility  Visit Diagnosis: Muscle weakness (generalized)  Abnormal posture     Problem List Patient Active Problem List   Diagnosis Date Noted  . Myofascial pain dysfunction syndrome 07/14/2017  . Blind right eye 07/14/2017  . Cervical radiculopathy 07/14/2017  . Degenerative arthritis of left knee, XRAY12/2018 07/13/2017  . Dizziness 03/01/2017  . Morbid obesity (HCC) 02/24/2017  . Sjogren's disease (HCC) 02/24/2017  . Dense breasts 02/03/2017  . ETD (eustachian tube dysfunction) 01/21/2017  . Fatigue 01/21/2017  . Failed back syndrome of lumbar spine 12/30/2016  . Chronic low back pain with sciatica 05/02/2015  . Chronic systolic congestive heart failure (HCC), EF 35-40% 03/22/2015  . Sicca (HCC) 11/22/2014  . Family history of polycystic kidney 11/16/2012  . Vitamin D deficiency, on daily vitamin D 10/09/2009  . Hyperlipidemia, with high triglycerides and low HDL, not on statin 11/15/2008  . Depression with anxiety 08/31/2008  . Essential hypertension, on Losartan, Coreg, and Spironolactone 08/31/2008  . Allergic rhinitis, on  Flonase 08/31/2008  . Asthma, Symbicort 08/31/2008  . GERD 08/31/2008  . Osteoarthritis 08/31/2008  . Hypothyroidism, on Levothyroxine 08/30/2008  . Classical migraine without intractable migraine 08/30/2008  . Abnormal Pap smear of cervix 05/07/1995    Liset Mcmonigle, PT 09/08/2017, 12:15 PM  [facility] [address] [place], [place], [address] Phone: [phone]   Fax:  [phone]  Name: Kristine Curtis MRN: [id number] Date of Birth: June 16, 1960

## 2017-09-09 ENCOUNTER — Ambulatory Visit: Payer: 59 | Admitting: Family Medicine

## 2017-09-09 ENCOUNTER — Encounter: Payer: Self-pay | Admitting: Family Medicine

## 2017-09-09 VITALS — BP 128/80 | HR 82 | Temp 98.6°F | Ht 63.0 in | Wt 232.0 lb

## 2017-09-09 DIAGNOSIS — R21 Rash and other nonspecific skin eruption: Secondary | ICD-10-CM

## 2017-09-09 MED ORDER — MOMETASONE FUROATE 220 MCG/INH IN AEPB: 2.0000 | INHALATION_SPRAY | Freq: Every day | RESPIRATORY_TRACT | 0 refills | Status: DC

## 2017-09-09 MED ORDER — TOPIRAMATE 25 MG PO TABS: 25.0000 mg | ORAL_TABLET | Freq: Two times a day (BID) | ORAL | 3 refills | Status: DC

## 2017-09-09 MED ORDER — CARVEDILOL 25 MG PO TABS: ORAL_TABLET | ORAL | 6 refills | Status: DC

## 2017-09-09 MED ORDER — MELOXICAM 7.5 MG PO TABS: 7.5000 mg | ORAL_TABLET | Freq: Every day | ORAL | 2 refills | Status: DC

## 2017-09-09 MED ORDER — HYDROXYZINE HCL 25 MG PO TABS: 25.0000 mg | ORAL_TABLET | Freq: Four times a day (QID) | ORAL | 0 refills | Status: DC | PRN

## 2017-09-09 MED ORDER — OMEPRAZOLE 40 MG PO CPDR: DELAYED_RELEASE_CAPSULE | ORAL | 1 refills | Status: DC

## 2017-09-09 MED ORDER — LEVOTHYROXINE SODIUM 88 MCG PO TABS: 88.0000 ug | ORAL_TABLET | Freq: Every day | ORAL | 2 refills | Status: DC

## 2017-09-09 MED ORDER — SPIRONOLACTONE 25 MG PO TABS: 25.0000 mg | ORAL_TABLET | Freq: Every day | ORAL | 1 refills | Status: DC

## 2017-09-09 NOTE — Progress Notes (Signed)
Kristine Curtis is a 57 y.o. female is here for follow up.  History of Present Illness:   [name], CMA acting as scribe for [name].   HPI:  Rash on both legs seen for it back in February. She has taken all the ointment that has been prescribed and over the counter medications with no improvement. She has not made any changes in any products that she uses.   There are no preventive care reminders to display for this patient.   Depression screen [facility] 2/9 08/11/2017 06/09/2017  Decreased Interest 1 0  Down, Depressed, Hopeless 1 0  PHQ - 2 Score 2 0  Altered sleeping 0 3  Tired, decreased energy 1 2  Change in appetite 0 0  Feeling bad or failure about yourself  0 0  Trouble concentrating 3 3  Moving slowly or fidgety/restless 0 0  Suicidal thoughts - 0  PHQ-9 Score 6 8  Difficult doing work/chores Somewhat difficult -   PMHx, SurgHx, SocialHx, FamHx, Medications, and Allergies were reviewed in the Visit Navigator and updated as appropriate.   Patient Active Problem List   Diagnosis Date Noted  . Myofascial pain dysfunction syndrome 07/14/2017  . Blind right eye 07/14/2017  . Cervical radiculopathy 07/14/2017  . Degenerative arthritis of left knee, XRAY12/2018 07/13/2017  . Dizziness 03/01/2017  . Morbid obesity (HCC) 02/24/2017  . Sjogren's disease (HCC) 02/24/2017  . Dense breasts 02/03/2017  . ETD (eustachian tube dysfunction) 01/21/2017  . Fatigue 01/21/2017  . Failed back syndrome of lumbar spine 12/30/2016  . Chronic low back pain with sciatica 05/02/2015  . Chronic systolic congestive heart failure (HCC), EF 35-40% 03/22/2015  . Sicca (HCC) 11/22/2014  . Family history of polycystic kidney 11/16/2012  . Vitamin D deficiency, on daily vitamin D 10/09/2009  . Hyperlipidemia, with high triglycerides and low HDL, not on statin 11/15/2008  . Depression with anxiety 08/31/2008  . Essential hypertension, on Losartan, Coreg, and Spironolactone  08/31/2008  . Allergic rhinitis, on Flonase 08/31/2008  . Asthma, Symbicort 08/31/2008  . GERD 08/31/2008  . Osteoarthritis 08/31/2008  . Hypothyroidism, on Levothyroxine 08/30/2008  . Classical migraine without intractable migraine 08/30/2008  . Abnormal Pap smear of cervix 05/07/1995   Social History   Tobacco Use  . Smoking status: Former Smoker    Packs/day: 2.00    Years: 12.00    Pack years: 24.00    Types: Cigarettes    Last attempt to quit: 05/06/1989    Years since quitting: 28.3  . Smokeless tobacco: Never Used  . Tobacco comment: quit smoking 1990  Substance Use Topics  . Alcohol use: No    Alcohol/week: 0.0 oz  . Drug use: No   Current Medications and Allergies:   Current Outpatient Medications:  .  acetaminophen (TYLENOL) 500 MG tablet, Take 1,000 mg by mouth every 4 (four) hours as needed for moderate pain or headache., Disp: , Rfl:  .  Aspirin-Acetaminophen-Caffeine (EXCEDRIN EXTRA STRENGTH PO), Take 2 tablets by mouth 2 (two) times daily as needed (headaches)., Disp: , Rfl:  .  buPROPion (WELLBUTRIN XL) 300 MG 24 hr tablet, TAKE 1 TABLET BY MOUTH DAILY, Disp: 30 tablet, Rfl: 11 .  carvedilol (COREG) 25 MG tablet, Take 1/2 tablet ( 12.5 mg ) twice a day, Disp: 60 tablet, Rfl: 6 .  Cholecalciferol (VITAMIN D-3) 5000 UNITS TABS, Take 5,000 Units by mouth daily. , Disp: , Rfl:  .  cyclobenzaprine (FLEXERIL) 5 MG tablet, Take 1 tablet  by mouth daily., Disp: , Rfl:  .  diazepam (VALIUM) 5 MG tablet, TAKE ONE (1) TABLET BY MOUTH EVERY SIX HOURS AS NEEDED, Disp: 30 tablet, Rfl: 3 .  fluticasone (FLONASE) 50 MCG/ACT nasal spray, Place 1 spray into both nostrils daily., Disp: 16 g, Rfl: 3 .  furosemide (LASIX) 20 MG tablet, TAKE 1 TABLET BY MOUTH DAILY, Disp: 90 tablet, Rfl: 2 .  hydroxychloroquine (PLAQUENIL) 200 MG tablet, Take 400 mg by mouth every morning. , Disp: , Rfl:  .  levothyroxine (SYNTHROID, LEVOTHROID) 88 MCG tablet, Take 1 tablet (88 mcg total) by mouth  daily., Disp: 30 tablet, Rfl: 2 .  losartan (COZAAR) 50 MG tablet, Take 50 mg by mouth daily. 1/2 tab bid, Disp: , Rfl:  .  meloxicam (MOBIC) 7.5 MG tablet, Take 1 tablet (7.5 mg total) by mouth daily., Disp: 30 tablet, Rfl: 2 .  mometasone (ASMANEX) 220 MCG/INH inhaler, Inhale 2 puffs into the lungs daily., Disp: 1 Inhaler, Rfl: 0 .  nystatin ointment (MYCOSTATIN), Apply 1 application topically 2 (two) times daily., Disp: 30 g, Rfl: 0 .  omeprazole (PRILOSEC) 40 MG capsule, TAKE ONE (1) CAPSULE BY MOUTH  BEFORE BREAKFAST, Disp: 30 capsule, Rfl: 1 .  ONE TOUCH ULTRA TEST test strip, USE AS DIRECTED TO CHECK BLOOD SUGAR ONCE A DAY, Disp: 100 each, Rfl: 3 .  OVER THE COUNTER MEDICATION, Take 5 capsules by mouth daily. Ariix Optimals Vitamin & Minerals , Disp: , Rfl:  .  OVER THE COUNTER MEDICATION, Take 1 scoop by mouth daily. Ariix Magnecal D , Disp: , Rfl:  .  oxyCODONE-acetaminophen (PERCOCET/ROXICET) 5-325 MG tablet, Take 1 tablet by mouth every 8 (eight) hours as needed for severe pain., Disp: 60 tablet, Rfl: 0 .  spironolactone (ALDACTONE) 25 MG tablet, TAKE ONE TABLET BY MOUTH EVERY DAY, Disp: 90 tablet, Rfl: 1 .  topiramate (TOPAMAX) 25 MG tablet, Take 1 tablet (25 mg total) by mouth 2 (two) times daily., Disp: 60 tablet, Rfl: 3 .  triamcinolone ointment (KENALOG) 0.5 %, Apply 1 application topically 2 (two) times daily., Disp: 30 g, Rfl: 0 .  Wheat Dextrin (BENEFIBER) POWD, Take 2 scoop by mouth daily as needed (for constipation). , Disp: , Rfl:    Allergies  Allergen Reactions  . Gabapentin (Once-Daily)     Depression   . Saxenda [Liraglutide -Weight Management] Other (See Comments)    Depression   . Sulfa Antibiotics     itching  . Trintellix [Vortioxetine] Other (See Comments)    GI issues   . Adhesive [Tape] Rash  . Codeine Itching   Review of Systems   Pertinent items are noted in the HPI. Otherwise, ROS is negative.  Vitals:   Vitals:   09/09/17 1031  BP: 128/80    Pulse: 82  Temp: 98.6 F (37 C)  TempSrc: Oral  SpO2: 99%  Weight: 232 lb (105.2 kg)  Height: 5\' 3"  (1.6 m)     Body mass index is 41.1 kg/m.   Physical Exam:   Physical Exam  Constitutional: She appears well-nourished.  HENT:  Head: Normocephalic and atraumatic.  Eyes: Pupils are equal, round, and reactive to light. EOM are normal.  Neck: Normal range of motion. Neck supple.  Cardiovascular: Normal rate, regular rhythm, normal heart sounds and intact distal pulses.  Pulmonary/Chest: Effort normal.  Abdominal: Soft.  Skin: Skin is warm. Rash noted. Rash is papular and urticarial.     Psychiatric: She has a normal mood and affect. Her  behavior is normal.  Nursing note and vitals reviewed.    Results for orders placed or performed in visit on 08/11/17  Comprehensive metabolic panel  Result Value Ref Range   Sodium 141 135 - 145 mEq/L   Potassium 4.2 3.5 - 5.1 mEq/L   Chloride 108 96 - 112 mEq/L   CO2 27 19 - 32 mEq/L   Glucose, Bld 93 70 - 99 mg/dL   BUN 12 6 - 23 mg/dL   Creatinine, Ser 4.40 0.40 - 1.20 mg/dL   Total Bilirubin 0.5 0.2 - 1.2 mg/dL   Alkaline Phosphatase 95 39 - 117 U/L   AST 23 0 - 37 U/L   ALT 23 0 - 35 U/L   Total Protein 6.5 6.0 - 8.3 g/dL   Albumin 4.0 3.5 - 5.2 g/dL   Calcium 9.3 8.4 - 10.2 mg/dL   GFR 72.53 >66.44 mL/min    Assessment and Plan:   Kristine Curtis was seen today for rash.  Diagnoses and all orders for this visit:  Rash -     Dermatology pathology  Punch biopsy Indication: rash Location: right shin Verbal informed consent obtained.  Pt aware of risks not limited to but including infection, bleeding, damage to near by organs. Prep: etoh/betadine Anesthesia: 1% lidocaine with epi, good effect Punch made with 3 mm punch Minimal oozing, controlled with pressure Tolerated well Routine postprocedure instructions d/w pt- keep area clean and bandaged, follow up if concerns/spreading erythema/pain.  Other orders -     carvedilol  (COREG) 25 MG tablet; Take 1/2 tablet ( 12.5 mg ) twice a day -     levothyroxine (SYNTHROID, LEVOTHROID) 88 MCG tablet; Take 1 tablet (88 mcg total) by mouth daily. -     meloxicam (MOBIC) 7.5 MG tablet; Take 1 tablet (7.5 mg total) by mouth daily. -     mometasone (ASMANEX) 220 MCG/INH inhaler; Inhale 2 puffs into the lungs daily. -     omeprazole (PRILOSEC) 40 MG capsule; TAKE ONE (1) CAPSULE BY MOUTH  BEFORE BREAKFAST -     spironolactone (ALDACTONE) 25 MG tablet; Take 1 tablet (25 mg total) by mouth daily. -     topiramate (TOPAMAX) 25 MG tablet; Take 1 tablet (25 mg total) by mouth 2 (two) times daily. -     hydrOXYzine (ATARAX/VISTARIL) 25 MG tablet; Take 1 tablet (25 mg total) by mouth every 6 (six) hours as needed for itching (insomnia).  . Reviewed expectations re: course of current medical issues. . Discussed self-management of symptoms. . Outlined signs and symptoms indicating need for more acute intervention. . Patient verbalized understanding and all questions were answered. Marland Kitchen Health Maintenance issues including appropriate healthy diet, exercise, and smoking avoidance were discussed with patient. . See orders for this visit as documented in the electronic medical record. . Patient received an After Visit Summary.  Helane Rima, DO [place] 09/09/2017  Future Appointments  Date Time Provider Department Center  09/15/2017  8:45 AM Berneice Heinrich, PT [facility]  09/22/2017  8:45 AM Berneice Heinrich, PT [facility]  10/01/2017  8:45 AM Berneice Heinrich, PT [facility]  10/06/2017  8:45 AM Berneice Heinrich, PT [facility]  10/13/2017  8:45 AM Berneice Heinrich, PT [facility]  11/10/2017  8:00 AM Helane Rima, DO [facility]

## 2017-09-12 ENCOUNTER — Other Ambulatory Visit: Payer: Self-pay

## 2017-09-12 DIAGNOSIS — R21 Rash and other nonspecific skin eruption: Secondary | ICD-10-CM

## 2017-09-15 ENCOUNTER — Ambulatory Visit: Payer: 59 | Admitting: Rehabilitative and Restorative Service Providers"

## 2017-09-15 ENCOUNTER — Encounter: Payer: Self-pay | Admitting: Rehabilitative and Restorative Service Providers"

## 2017-09-15 DIAGNOSIS — M6281 Muscle weakness (generalized): Secondary | ICD-10-CM

## 2017-09-15 DIAGNOSIS — R2689 Other abnormalities of gait and mobility: Secondary | ICD-10-CM

## 2017-09-15 DIAGNOSIS — R293 Abnormal posture: Secondary | ICD-10-CM

## 2017-09-15 NOTE — Patient Instructions (Signed)
Straight Leg Raise    Tighten stomach and slowly raise locked right leg __6-8__ inches from floor. Repeat __5-8__ times per set. Do __1-2__ sets per session. Do __1__ sessions per day.  http://orth.exer.us/1103   Copyright  VHI. All rights reserved.    Side Stretch (Kneeling)    Inhaling, reach left arm up and over toward right side. Exhale and stretch. Hold position for _30 seconds.. Exhale while bringing arm down to side. Repeat _3__ times each side. Repeat on other side. Do __1_ times per day.  Copyright  VHI. All rights reserved.  Access Code: [email or web address]  URL: [email or web address]  Date: 09/08/2017  Prepared by: Margretta Ditty   Exercises   Supine Lower Trunk Rotation - 3 reps - 30 hold - 1x daily - 7x weekly             Electronically signed by Berneice Heinrich, PT at 08/29/2017 9:30 AM     Strengthening: Hip Abduction (Side-Lying)    LIE ON YOUR SIDE AND PLACE A FOLDED PILLOW UNDER TOP LEG (AT ANKLE).Tighten muscles on front of left thigh, then lift leg _6___ inches from surface, keeping knee locked.  Repeat __5_ times per set. Do __1__ sets per session. Do __2__ sessions per day.  http://orth.exer.us/623  Copyright  VHI. All rights reserved.

## 2017-09-15 NOTE — Therapy (Signed)
[facility] [address] [place], [place], [address] Phone: [phone]   Fax:  [phone]  Physical Therapy Treatment  Patient Details  Name: Kristine Curtis MRN: [id number] Date of Birth: August 18, 1960 Referring Provider: Kristeen Miss, MD   Encounter Date: 09/15/2017  PT End of Session - 09/15/17 0921    Visit Number  3    Number of Visits  6    Date for PT Re-Evaluation  10/28/17    Authorization Type  [employer] private insurance (60 visit limit combined PT/OT/ST)    PT Start Time  [phone]    PT Stop Time  0932    PT Time Calculation (min)  39 min    Activity Tolerance  Patient tolerated treatment well    Behavior During Therapy  [facility] for tasks assessed/performed       Past Medical History:  Diagnosis Date  . Allergic rhinitis due to pollen   . Asthma   . Chronic combined systolic and diastolic CHF (congestive heart failure) ([place])    06/2016 Echo: EF 40-45%, Gr1 DD  . Depression   . Family history of polycystic kidney with negative CT 11/16/2012  . Fibromyalgia   . GERD (gastroesophageal reflux disease)   . Hypertension   . Hypothyroidism   . IBS (irritable bowel syndrome)   . Internal hemorrhoids   . Morbid obesity ([place])   . NICM (nonischemic cardiomyopathy) ([place])    a. 1999 Cath: nl cors;  b. EF prev as low as 35%;  c. 11/2015 Echo: Ef 40-45%;  d. 06/2016 Echo: EF 40-45%;  e. Lexiscan MV: fixed anterosepta/inferseptal defect w/o ischemia, EF 35%.  . Osteoarthritis   . PVC (premature ventricular contraction)   . Restless leg syndrome   . Sicca ([place]) 11/22/2014  . Vertigo     Past Surgical History:  Procedure Laterality Date  . ABDOMINAL HYSTERECTOMY    . CESAREAN SECTION    . CHOLECYSTECTOMY  10-2008  . COLONOSCOPY    . ESOPHAGOGASTRODUODENOSCOPY    . EYE SURGERY     2 2013 and 1981/DCR of right eye 05/2014  . LUMBAR DISC SURGERY  2016   L3-4  . RIGHT/LEFT HEART CATH AND CORONARY ANGIOGRAPHY N/A 07/11/2016    Procedure: Right/Left Heart Cath and Coronary Angiography;  Surgeon: Peter M Martinique, MD;  Location: [facility];  Service: Cardiovascular;  Laterality: N/A;  . SHOULDER ARTHROSCOPY W/ SUBACROMIAL DECOMPRESSION AND DISTAL CLAVICLE EXCISION Right   . SPINE SURGERY     C6-C7, 03/2017  . TUBAL LIGATION  1992    There were no vitals filed for this visit.  Subjective Assessment - 09/15/17 0854    Subjective  The patient arrived a few minutes late to therapy today.  She notes exercises are working her low back to her knees and she has limited the repetitions.   She is still feeling sore and tight.  She is going to go to exercise class this week on Wednesday.    She is walking for longer distances working up to 15 minutes nonstop.      Pertinent History  systolic and diastolic heart failure, history of PACs and PVCs by Holter monitor, fibromyalgia, restless leg syndrome, hypertension, depression, skin rash persistent.    Patient Stated Goals  "figure out how to stretch my low back as much as I can." Work on balance and continue general strengthening.    Currently in Pain?  Yes    Pain Score  3  Pain Location  Back    Pain Orientation  Lower    Pain Descriptors / Indicators  Sore;Tightness    Pain Type  Chronic pain    Pain Onset  More than a month ago    Pain Frequency  Constant    Aggravating Factors   stretches are creating some soreness, modifies to tolerance    Pain Relieving Factors  stretching, accupuncture                       [facility] Adult PT Treatment/Exercise - 09/15/17 1153      Ambulation/Gait   Gait Comments  Walking for ther ex for endurance- patient performed x 6 minutes nonstop without c/o worsening pain.      Self-Care   Self-Care  Other Self-Care Comments    Other Self-Care Comments   Discussed community class on wednesday am at [facility].  Patient to begin this week. Also discussed progression of home walking program.  Patient and PT discussed  potential early d/c. Plan to see next visit next week (to assess appropriateness of community class), and then set up f/u in 4 weeks to determine if further progression of HEP is needed.      Exercises   Exercises  Lumbar;Knee/Hip      Neck Exercises: Supine   Other Supine Exercise  trunk rotation from last week's HEP with overpressure for hip IR and holding within tolerable range.      Knee/Hip Exercises: Seated   Other Seated Knee/Hip Exercises  Seated hip ER/IR.  Discussed home stretching for adductors using straddle stretch.    Marching  Strengthening;Right;Left;5 reps      Knee/Hip Exercises: Supine   Straight Leg Raises  Strengthening;Right;Left;5 reps      Knee/Hip Exercises: Sidelying   Hip ABduction  Strengthening;10 reps;Right;Left    Hip ABduction Limitations  cues on technique             PT Education - 09/15/17 1151    Education provided  Yes    Education Details  HEP: added straight leg raise and hip abduction (from prior exercise list)    Person(s) Educated  Patient    Methods  Explanation;Demonstration;Handout    Comprehension  Verbalized understanding;Returned demonstration       PT Short Term Goals - 09/15/17 0901      PT SHORT TERM GOAL #1   Title  The patient will be indep with HEP for deep hip stretching, low back stretching, core stability, general conditioning, and balance.    Time  4    Period  Weeks    Status  Achieved      PT SHORT TERM GOAL #2   Title  The patient will improve LE strength for bilateral hip flexion to 4/5.    Baseline  3/5 at re-evaluation; on 09/15/17 3/5 R and 3+/5 left.    Time  4    Period  Weeks    Status  Not Met      PT SHORT TERM GOAL #3   Title  The patient will improve R hip abduction to 4/5 strength.    Baseline  R hip 4/5, L hip 4/5.    Time  4    Period  Weeks    Status  Achieved        PT Long Term Goals - 08/29/17 6629      PT LONG TERM GOAL #1   Title  The patient will verbalize understanding of  progression of HEP/community wellness for long term participation in exercise.    Time  6    Period  Weeks    Target Date  10/28/17      PT LONG TERM GOAL #2   Title  The patient will verbalize understanding of chronic pain management for long term mgmt of symptoms.    Time  6    Period  Weeks    Target Date  10/28/17            Plan - 09/15/17 1151    Clinical Impression Statement  The patient has met 2/3 short term goals.  She is planning to begin community exercise this week.  PT and patient discussed current progress towards STGs and anticipated ability to reduce frequency with emphasis on community program, and maybe 1 f/u in 4-6 weeks to ensure going well.     PT Treatment/Interventions  ADLs/Self Care Home Management;Therapeutic exercise;Therapeutic activities;Manual techniques;Patient/family education;Gait training;Functional mobility training;Balance training;Neuromuscular re-education;Vestibular    PT Next Visit Plan  check new additions to HEP, how community class going, encourage continued progress with walking program, plan discharge (have a f/u in 4-6 weeks)    Consulted and Agree with Plan of Care  Patient       Patient will benefit from skilled therapeutic intervention in order to improve the following deficits and impairments:  Abnormal gait, Obesity, Decreased strength, Pain, Decreased balance, Difficulty walking, Postural dysfunction, Impaired flexibility  Visit Diagnosis: Muscle weakness (generalized)  Abnormal posture  Other abnormalities of gait and mobility     Problem List Patient Active Problem List   Diagnosis Date Noted  . Myofascial pain dysfunction syndrome 07/14/2017  . Blind right eye 07/14/2017  . Cervical radiculopathy 07/14/2017  . Degenerative arthritis of left knee, XRAY12/2018 07/13/2017  . Dizziness 03/01/2017  . Morbid obesity ([place]) 02/24/2017  . Sjogren's disease ([place]) 02/24/2017  . Dense breasts 02/03/2017  . ETD (eustachian  tube dysfunction) 01/21/2017  . Fatigue 01/21/2017  . Failed back syndrome of lumbar spine 12/30/2016  . Chronic low back pain with sciatica 05/02/2015  . Chronic systolic congestive heart failure ([place]), EF 35-40% 03/22/2015  . Sicca ([place]) 11/22/2014  . Family history of polycystic kidney 11/16/2012  . Vitamin D deficiency, on daily vitamin D 10/09/2009  . Hyperlipidemia, with high triglycerides and low HDL, not on statin 11/15/2008  . Depression with anxiety 08/31/2008  . Essential hypertension, on Losartan, Coreg, and Spironolactone 08/31/2008  . Allergic rhinitis, on Flonase 08/31/2008  . Asthma, Symbicort 08/31/2008  . GERD 08/31/2008  . Osteoarthritis 08/31/2008  . Hypothyroidism, on Levothyroxine 08/30/2008  . Classical migraine without intractable migraine 08/30/2008  . Abnormal Pap smear of cervix 05/07/1995    Flor Houdeshell, PT 09/15/2017, 12:07 PM  [facility] [address] [place], [place], [address] Phone: [phone]   Fax:  [phone]  Name: HANIYA FERN MRN: [id number] Date of Birth: [birth year]

## 2017-09-19 ENCOUNTER — Encounter: Payer: Self-pay | Admitting: Family Medicine

## 2017-09-22 ENCOUNTER — Ambulatory Visit: Payer: 59 | Admitting: Rehabilitative and Restorative Service Providers"

## 2017-09-22 ENCOUNTER — Encounter: Payer: Self-pay | Admitting: Rehabilitative and Restorative Service Providers"

## 2017-09-22 DIAGNOSIS — R2681 Unsteadiness on feet: Secondary | ICD-10-CM

## 2017-09-22 DIAGNOSIS — M6281 Muscle weakness (generalized): Secondary | ICD-10-CM | POA: Diagnosis not present

## 2017-09-22 DIAGNOSIS — R293 Abnormal posture: Secondary | ICD-10-CM

## 2017-09-22 DIAGNOSIS — R2689 Other abnormalities of gait and mobility: Secondary | ICD-10-CM

## 2017-09-22 NOTE — Therapy (Signed)
[facility] [address] [place], [place], [address] Phone: [phone]   Fax:  [phone]  Physical Therapy Treatment  Patient Details  Name: Kristine Curtis MRN: [id number] Date of Birth: 03/23/1961 Referring Provider: Kristeen Miss, MD   Encounter Date: 09/22/2017  PT End of Session - 09/22/17 1220    Visit Number  4    Number of Visits  6    Date for PT Re-Evaluation  10/28/17    Authorization Type  [employer] private insurance (60 visit limit combined PT/OT/ST)    PT Start Time  0850    PT Stop Time  0935    PT Time Calculation (min)  45 min    Activity Tolerance  Patient tolerated treatment well    Behavior During Therapy  [facility] for tasks assessed/performed       Past Medical History:  Diagnosis Date  . Allergic rhinitis due to pollen   . Asthma   . Chronic combined systolic and diastolic CHF (congestive heart failure) ([place])    06/2016 Echo: EF 40-45%, Gr1 DD  . Depression   . Family history of polycystic kidney with negative CT 11/16/2012  . Fibromyalgia   . GERD (gastroesophageal reflux disease)   . Hypertension   . Hypothyroidism   . IBS (irritable bowel syndrome)   . Internal hemorrhoids   . Morbid obesity ([place])   . NICM (nonischemic cardiomyopathy) ([place])    a. 1999 Cath: nl cors;  b. EF prev as low as 35%;  c. 11/2015 Echo: Ef 40-45%;  d. 06/2016 Echo: EF 40-45%;  e. Lexiscan MV: fixed anterosepta/inferseptal defect w/o ischemia, EF 35%.  . Osteoarthritis   . PVC (premature ventricular contraction)   . Restless leg syndrome   . Sicca ([place]) 11/22/2014  . Vertigo     Past Surgical History:  Procedure Laterality Date  . ABDOMINAL HYSTERECTOMY    . CESAREAN SECTION    . CHOLECYSTECTOMY  10-2008  . COLONOSCOPY    . ESOPHAGOGASTRODUODENOSCOPY    . EYE SURGERY     2 2013 and 1981/DCR of right eye 05/2014  . LUMBAR DISC SURGERY  2016   L3-4  . RIGHT/LEFT HEART CATH AND CORONARY ANGIOGRAPHY N/A 07/11/2016    Procedure: Right/Left Heart Cath and Coronary Angiography;  Surgeon: Peter M Martinique, MD;  Location: [facility];  Service: Cardiovascular;  Laterality: N/A;  . SHOULDER ARTHROSCOPY W/ SUBACROMIAL DECOMPRESSION AND DISTAL CLAVICLE EXCISION Right   . SPINE SURGERY     C6-C7, 03/2017  . TUBAL LIGATION  1992    There were no vitals filed for this visit.  Subjective Assessment - 09/22/17 0852    Subjective  The patient has not had a chance to try community class due to keeping her granddaughter last week.   Patient reports that she starts the day at 2-3/10 pain level and at night she is up to 9/10 due to worsening fatigue level.  The patient reports her skin rash got worse on Friday after having a 2 hour stressful situation regarding medicare and disability.     Pertinent History  systolic and diastolic heart failure, history of PACs and PVCs by Holter monitor, fibromyalgia, restless leg syndrome, hypertension, depression, skin rash persistent.    Patient Stated Goals  "figure out how to stretch my low back as much as I can." Work on balance and continue general strengthening.    Currently in Pain?  Yes    Pain Score  3  Pain Location  Back    Pain Orientation  Lower    Pain Descriptors / Indicators  Aching;Tender;Sore    Pain Onset  More than a month ago    Pain Frequency  Constant    Aggravating Factors   fluctuates    Pain Relieving Factors  stretching, acupuncture                       [facility] Adult PT Treatment/Exercise - 09/22/17 0902      Ambulation/Gait   Ambulation/Gait  Yes    Ambulation/Gait Assistance  7: Independent    Ambulation Distance (Feet)  800 Feet    Assistive device  None    Ambulation Surface  Level;Indoor;Outdoor;Paved    Gait Comments  Patient ambulated 6 minutes nonstop on ourdoor, level/paved surfaces.  She has some shortness of breath  with short distance ambulation.        Self-Care   Self-Care  Other Self-Care Comments    Other  Self-Care Comments   Patient and PT discussed following through with community exercise plan.  Also recommended the patient perform home walking program.  PT plan was to f/u in 3-4 weeks to allow time for community participation, however she feels the neurologist is going to want her to have more therapy for balance.  PT recommended she discuss that she has home walking program and stretching/strengthening activities for chronic pain mgmt.      Exercises   Exercises  Lumbar;Knee/Hip      Lumbar Exercises: Stretches   Piriformis Stretch  2 reps;20 seconds      Lumbar Exercises: Supine   Straight Leg Raise  10 reps right and left sides      Lumbar Exercises: Sidelying   Hip Abduction  Right;Left;10 reps;3 seconds    Other Sidelying Lumbar Exercises  Quadratus lumborum stretch sidelying.               PT Short Term Goals - 09/15/17 0901      PT SHORT TERM GOAL #1   Title  The patient will be indep with HEP for deep hip stretching, low back stretching, core stability, general conditioning, and balance.    Time  4    Period  Weeks    Status  Achieved      PT SHORT TERM GOAL #2   Title  The patient will improve LE strength for bilateral hip flexion to 4/5.    Baseline  3/5 at re-evaluation; on 09/15/17 3/5 R and 3+/5 left.    Time  4    Period  Weeks    Status  Not Met      PT SHORT TERM GOAL #3   Title  The patient will improve R hip abduction to 4/5 strength.    Baseline  R hip 4/5, L hip 4/5.    Time  4    Period  Weeks    Status  Achieved        PT Long Term Goals - 08/29/17 7829      PT LONG TERM GOAL #1   Title  The patient will verbalize understanding of progression of HEP/community wellness for long term participation in exercise.    Time  6    Period  Weeks    Target Date  10/28/17      PT LONG TERM GOAL #2   Title  The patient will verbalize understanding of chronic pain management for long term mgmt of symptoms.  Time  6    Period  Weeks    Target  Date  10/28/17            Plan - 09/22/17 1222    Clinical Impression Statement  The patient has home exercies and PT modified technique on a couple of activities today.  Patient is doing well with home walking program.  PT encouraging patient to begin to participate in community activities as this will lead to strategies to manage pain in the long run.  Patient to participate with community classes and f/u with PT if needed.     PT Treatment/Interventions  ADLs/Self Care Home Management;Therapeutic exercise;Therapeutic activities;Manual techniques;Patient/family education;Gait training;Functional mobility training;Balance training;Neuromuscular re-education;Vestibular    PT Next Visit Plan  discuss neurology visit, encourage community integration, ? f/u visit only if needed    Consulted and Agree with Plan of Care  Patient       Patient will benefit from skilled therapeutic intervention in order to improve the following deficits and impairments:  Abnormal gait, Obesity, Decreased strength, Pain, Decreased balance, Difficulty walking, Postural dysfunction, Impaired flexibility  Visit Diagnosis: Muscle weakness (generalized)  Abnormal posture  Other abnormalities of gait and mobility  Unsteadiness on feet     Problem List Patient Active Problem List   Diagnosis Date Noted  . Myofascial pain dysfunction syndrome 07/14/2017  . Blind right eye 07/14/2017  . Cervical radiculopathy 07/14/2017  . Degenerative arthritis of left knee, XRAY12/2018 07/13/2017  . Dizziness 03/01/2017  . Morbid obesity ([place]) 02/24/2017  . Sjogren's disease ([place]) 02/24/2017  . Dense breasts 02/03/2017  . ETD (eustachian tube dysfunction) 01/21/2017  . Fatigue 01/21/2017  . Failed back syndrome of lumbar spine 12/30/2016  . Chronic low back pain with sciatica 05/02/2015  . Chronic systolic congestive heart failure ([place]), EF 35-40% 03/22/2015  . Sicca ([place]) 11/22/2014  . Family history of polycystic  kidney 11/16/2012  . Vitamin D deficiency, on daily vitamin D 10/09/2009  . Hyperlipidemia, with high triglycerides and low HDL, not on statin 11/15/2008  . Depression with anxiety 08/31/2008  . Essential hypertension, on Losartan, Coreg, and Spironolactone 08/31/2008  . Allergic rhinitis, on Flonase 08/31/2008  . Asthma, Symbicort 08/31/2008  . GERD 08/31/2008  . Osteoarthritis 08/31/2008  . Hypothyroidism, on Levothyroxine 08/30/2008  . Classical migraine without intractable migraine 08/30/2008  . Abnormal Pap smear of cervix 05/07/1995    Xia Stohr, PT 09/22/2017, 12:29 PM  [facility] [address] [place], [place], [address] Phone: [phone]   Fax:  [phone]  Name: Kristine Curtis MRN: [id number] Date of Birth: 09-20-1960

## 2017-09-23 ENCOUNTER — Telehealth: Payer: Self-pay | Admitting: Family Medicine

## 2017-09-23 ENCOUNTER — Other Ambulatory Visit: Payer: Self-pay

## 2017-09-23 MED ORDER — DOXYCYCLINE HYCLATE 100 MG PO TABS: 100.0000 mg | ORAL_TABLET | Freq: Two times a day (BID) | ORAL | 0 refills | Status: DC

## 2017-09-23 NOTE — Telephone Encounter (Signed)
Copied from CRM [id number]. Topic: Quick Communication - See Telephone Encounter >> Sep 23, 2017  1:24 PM Landry Mellow wrote: CRM for notification. See Telephone encounter for: 09/23/17. Pt is asking if her biopsy was checked for vasculitis?  Cb is [phone]

## 2017-09-23 NOTE — Telephone Encounter (Signed)
Was part of pathology. Let's make sure that we follow up on Dermatology referral. May need to change to urgent?

## 2017-09-23 NOTE — Telephone Encounter (Signed)
Can that be tested for in path? Or only in blood work?

## 2017-09-25 NOTE — Telephone Encounter (Signed)
See my chart message

## 2017-10-01 ENCOUNTER — Encounter: Payer: Self-pay | Admitting: Rehabilitative and Restorative Service Providers"

## 2017-10-01 ENCOUNTER — Ambulatory Visit: Payer: 59 | Admitting: Rehabilitative and Restorative Service Providers"

## 2017-10-01 DIAGNOSIS — R293 Abnormal posture: Secondary | ICD-10-CM

## 2017-10-01 DIAGNOSIS — M519 Unspecified thoracic, thoracolumbar and lumbosacral intervertebral disc disorder: Secondary | ICD-10-CM | POA: Insufficient documentation

## 2017-10-01 DIAGNOSIS — R2689 Other abnormalities of gait and mobility: Secondary | ICD-10-CM

## 2017-10-01 DIAGNOSIS — M6281 Muscle weakness (generalized): Secondary | ICD-10-CM | POA: Diagnosis not present

## 2017-10-01 DIAGNOSIS — G2581 Restless legs syndrome: Secondary | ICD-10-CM | POA: Insufficient documentation

## 2017-10-01 NOTE — Therapy (Signed)
[facility] [address] [place], [place], [address] Phone: [phone]   Fax:  [phone]  Physical Therapy Treatment  Patient Details  Name: Kristine Curtis MRN: [id number] Date of Birth: [birth year] Referring Provider: Kristeen Miss, MD   Encounter Date: 10/01/2017  PT End of Session - 10/01/17 0855    Visit Number  5    Number of Visits  6    Date for PT Re-Evaluation  10/28/17    Authorization Type  [employer] private insurance (60 visit limit combined PT/OT/ST)    PT Start Time  [phone]    PT Stop Time  0923    PT Time Calculation (min)  30 min    Activity Tolerance  Patient limited by pain    Behavior During Therapy  [facility] for tasks assessed/performed       Past Medical History:  Diagnosis Date  . Allergic rhinitis due to pollen   . Asthma   . Chronic combined systolic and diastolic CHF (congestive heart failure) ([place])    06/2016 Echo: EF 40-45%, Gr1 DD  . Depression   . Family history of polycystic kidney with negative CT 11/16/2012  . Fibromyalgia   . GERD (gastroesophageal reflux disease)   . Hypertension   . Hypothyroidism   . IBS (irritable bowel syndrome)   . Internal hemorrhoids   . Morbid obesity ([place])   . NICM (nonischemic cardiomyopathy) ([place])    a. 1999 Cath: nl cors;  b. EF prev as low as 35%;  c. 11/2015 Echo: Ef 40-45%;  d. 06/2016 Echo: EF 40-45%;  e. Lexiscan MV: fixed anterosepta/inferseptal defect w/o ischemia, EF 35%.  . Osteoarthritis   . PVC (premature ventricular contraction)   . Restless leg syndrome   . Sicca ([place]) 11/22/2014  . Vertigo     Past Surgical History:  Procedure Laterality Date  . ABDOMINAL HYSTERECTOMY    . CESAREAN SECTION    . CHOLECYSTECTOMY  10-2008  . COLONOSCOPY    . ESOPHAGOGASTRODUODENOSCOPY    . EYE SURGERY     2 2013 and 1981/DCR of right eye 05/2014  . LUMBAR DISC SURGERY  2016   L3-4  . RIGHT/LEFT HEART CATH AND CORONARY ANGIOGRAPHY N/A 07/11/2016   Procedure: Right/Left Heart Cath and Coronary Angiography;  Surgeon: Peter M Martinique, MD;  Location: [facility];  Service: Cardiovascular;  Laterality: N/A;  . SHOULDER ARTHROSCOPY W/ SUBACROMIAL DECOMPRESSION AND DISTAL CLAVICLE EXCISION Right   . SPINE SURGERY     C6-C7, 03/2017  . TUBAL LIGATION  1992    There were no vitals filed for this visit.  Subjective Assessment - 10/01/17 0854    Subjective  The patient reports her neurology visit went well and she was dx with cluster headaches.  She reports the neurologist wants to do injections for HA and to do a NCV study.  The patient reports neurologist recomends she continue physical therapy intermittentlly in order to ensure she is progressing with exercise routine, weight loss regime while working on improving overall health and mobility.  The patient reports "I fell" noting her right leg went numb on Monday and she was wearing birkenstocks and fell while going down the steps.  She has abrasions under her right knee and some mild redness and swelling in the distal right LE.  She notes " I don't think I broke anything."    Pertinent History  systolic and diastolic heart failure, history of PACs and PVCs by Holter monitor, fibromyalgia,  restless leg syndrome, hypertension, depression, skin rash persistent.    Patient Stated Goals  "figure out how to stretch my low back as much as I can." Work on balance and continue general strengthening.    Currently in Pain?  Yes    Pain Score  8     Pain Location  Back    Pain Orientation  Lower    Pain Descriptors / Indicators  Aching;Sore    Pain Type  Chronic pain;Acute pain    Pain Onset  More than a month ago    Pain Frequency  Constant    Aggravating Factors   worse since fall    Pain Relieving Factors  stretching, acupuncture                       [facility] Adult PT Treatment/Exercise - 10/01/17 0908      Self-Care   Self-Care  Other Self-Care Comments    Other Self-Care  Comments   Patient and PT discussed fall risk, safety on stairs recommending use of handrails.  Discussed going to [facility]:  She was scheduled to attend last Wednesday and notes her son got fired and this hindered her ability to attend community exercise.  PT and patient discussed prioritizing her exercise in order to make sustainable gains.  PATIENT'S PLAN: Discussed community exercise and [place] [facility] [employer] + balance class.  She has stopped in to find out schedule, but has not been able to start program.  PT discussed the only reason to return to PT is to check on progress of community exercise plan.  So,  f/u with therapy in 4-6 weeks is only needed if she enters into community class as her HEP is already established.   Patient also notes she is blind in right eye (20/800) -- PT discussed this may contribute to fall risk on stairs and curbs due to dec'd depth perception. The patient and PT discussed trying to keep moving after recent injury versus laying in bed. We discussed pattern of fall, regression in mobility, and then return to gentle exercise.  PT encouraged her to work through acute injury as tolerable.  If she is concerned about injury, she should see her physician.  Discussed gentle walking, gentle stretching all within tolerable range.              PT Education - 10/01/17 1142    Education provided  Yes    Education Details  discussed transition to community exercise    Person(s) Educated  Patient    Methods  Explanation    Comprehension  Verbalized understanding       PT Short Term Goals - 09/15/17 0901      PT SHORT TERM GOAL #1   Title  The patient will be indep with HEP for deep hip stretching, low back stretching, core stability, general conditioning, and balance.    Time  4    Period  Weeks    Status  Achieved      PT SHORT TERM GOAL #2   Title  The patient will improve LE strength for bilateral hip flexion to 4/5.    Baseline  3/5 at re-evaluation;  on 09/15/17 3/5 R and 3+/5 left.    Time  4    Period  Weeks    Status  Not Met      PT SHORT TERM GOAL #3   Title  The patient will improve R hip abduction to 4/5 strength.  Baseline  R hip 4/5, L hip 4/5.    Time  4    Period  Weeks    Status  Achieved        PT Long Term Goals - 08/29/17 6394      PT LONG TERM GOAL #1   Title  The patient will verbalize understanding of progression of HEP/community wellness for long term participation in exercise.    Time  6    Period  Weeks    Target Date  10/28/17      PT LONG TERM GOAL #2   Title  The patient will verbalize understanding of chronic pain management for long term mgmt of symptoms.    Time  6    Period  Weeks    Target Date  10/28/17            Plan - 10/01/17 0932    Clinical Impression Statement  PT and patient discussed a plan of progressing to community exercise.  She wishes to continue PT.  We dioscussed that I am willing to see her if she is actually attending community class as our main goal at this time is to ensure transition to community.     PT Treatment/Interventions  ADLs/Self Care Home Management;Therapeutic exercise;Therapeutic activities;Manual techniques;Patient/family education;Gait training;Functional mobility training;Balance training;Neuromuscular re-education;Vestibular    PT Next Visit Plan  f/u after beginning community class    Consulted and Agree with Plan of Care  Patient       Patient will benefit from skilled therapeutic intervention in order to improve the following deficits and impairments:  Abnormal gait, Obesity, Decreased strength, Pain, Decreased balance, Difficulty walking, Postural dysfunction, Impaired flexibility  Visit Diagnosis: Muscle weakness (generalized)  Abnormal posture  Other abnormalities of gait and mobility     Problem List Patient Active Problem List   Diagnosis Date Noted  . Myofascial pain dysfunction syndrome 07/14/2017  . Blind right eye 07/14/2017   . Cervical radiculopathy 07/14/2017  . Degenerative arthritis of left knee, XRAY12/2018 07/13/2017  . Dizziness 03/01/2017  . Morbid obesity ([place]) 02/24/2017  . Sjogren's disease ([place]) 02/24/2017  . Dense breasts 02/03/2017  . ETD (eustachian tube dysfunction) 01/21/2017  . Fatigue 01/21/2017  . Failed back syndrome of lumbar spine 12/30/2016  . Chronic low back pain with sciatica 05/02/2015  . Chronic systolic congestive heart failure ([place]), EF 35-40% 03/22/2015  . Sicca ([place]) 11/22/2014  . Family history of polycystic kidney 11/16/2012  . Vitamin D deficiency, on daily vitamin D 10/09/2009  . Hyperlipidemia, with high triglycerides and low HDL, not on statin 11/15/2008  . Depression with anxiety 08/31/2008  . Essential hypertension, on Losartan, Coreg, and Spironolactone 08/31/2008  . Allergic rhinitis, on Flonase 08/31/2008  . Asthma, Symbicort 08/31/2008  . GERD 08/31/2008  . Osteoarthritis 08/31/2008  . Hypothyroidism, on Levothyroxine 08/30/2008  . Classical migraine without intractable migraine 08/30/2008  . Abnormal Pap smear of cervix 05/07/1995    Kristine Curtis, PT 10/01/2017, 11:43 AM  [facility] [address] [place], [place], [address] Phone: [phone]   Fax:  [phone]  Name: Kristine Curtis MRN: [id number] Date of Birth: Jul 03, 1960

## 2017-10-06 ENCOUNTER — Ambulatory Visit: Payer: Self-pay | Admitting: Rehabilitative and Restorative Service Providers"

## 2017-10-12 NOTE — Progress Notes (Signed)
Kristine Curtis is a 57 y.o. female is here for follow up.  History of Present Illness:   Kristine Curtis CMA acting as scribe for Dr. Earlene Plater.  HPI: Patient comes in today for a rash that she has had since before the first of the year. She is here today with her husband. They are concerned because a referral was placed for Dermatology. The soonest that the Dermatology office can get her in is August 1st. I have called a Dermatology office in [facility] that can see her June 21st at 8:40AM. Patient was very haapy with this appointment. I have taken pictures of the patients rash to put in the chart.   There are no preventive care reminders to display for this patient. Depression screen [facility] 2/9 08/11/2017 06/09/2017  Decreased Interest 1 0  Down, Depressed, Hopeless 1 0  PHQ - 2 Score 2 0  Altered sleeping 0 3  Tired, decreased energy 1 2  Change in appetite 0 0  Feeling bad or failure about yourself  0 0  Trouble concentrating 3 3  Moving slowly or fidgety/restless 0 0  Suicidal thoughts - 0  PHQ-9 Score 6 8  Difficult doing work/chores Somewhat difficult -  Some recent data might be hidden   PMHx, SurgHx, SocialHx, FamHx, Medications, and Allergies were reviewed in the Visit Navigator and updated as appropriate.   Patient Active Problem List   Diagnosis Date Noted  . Myofascial pain dysfunction syndrome 07/14/2017  . Blind right eye 07/14/2017  . Cervical radiculopathy 07/14/2017  . Degenerative arthritis of left knee, XRAY12/2018 07/13/2017  . Dizziness 03/01/2017  . Morbid obesity ([facility]) 02/24/2017  . Sjogren's disease ([facility]) 02/24/2017  . Dense breasts 02/03/2017  . ETD (eustachian tube dysfunction) 01/21/2017  . Fatigue 01/21/2017  . Failed back syndrome of lumbar spine 12/30/2016  . Chronic low back pain with sciatica 05/02/2015  . Chronic systolic congestive heart failure ([facility]), EF 35-40% 03/22/2015  . Sicca ([facility]) 11/22/2014  . Family history of polycystic kidney 11/16/2012    . Vitamin D deficiency, on daily vitamin D 10/09/2009  . Hyperlipidemia, with high triglycerides and low HDL, not on statin 11/15/2008  . Depression with anxiety 08/31/2008  . Essential hypertension, on Losartan, Coreg, and Spironolactone 08/31/2008  . Allergic rhinitis, on Flonase 08/31/2008  . Asthma, Symbicort 08/31/2008  . GERD 08/31/2008  . Osteoarthritis 08/31/2008  . Hypothyroidism, on Levothyroxine 08/30/2008  . Classical migraine without intractable migraine 08/30/2008  . Abnormal Pap smear of cervix 05/07/1995   Social History   Tobacco Use  . Smoking status: Former Smoker    Packs/day: 2.00    Years: 12.00    Pack years: 24.00    Types: Cigarettes    Last attempt to quit: 05/06/1989    Years since quitting: 28.4  . Smokeless tobacco: Never Used  . Tobacco comment: quit smoking 1990  Substance Use Topics  . Alcohol use: No    Alcohol/week: 0.0 oz  . Drug use: No   Current Medications and Allergies:   Current Outpatient Medications:  .  acetaminophen (TYLENOL) 500 MG tablet, Take 1,000 mg by mouth every 4 (four) hours as needed for moderate pain or headache., Disp: , Rfl:  .  Aspirin-Acetaminophen-Caffeine (EXCEDRIN EXTRA STRENGTH PO), Take 2 tablets by mouth 2 (two) times daily as needed (headaches)., Disp: , Rfl:  .  buPROPion (WELLBUTRIN XL) 300 MG 24 hr tablet, TAKE 1 TABLET BY MOUTH DAILY, Disp: 30 tablet, Rfl: 11 .  carvedilol (COREG)  25 MG tablet, Take 1/2 tablet ( 12.5 mg ) twice a day, Disp: 60 tablet, Rfl: 6 .  Cholecalciferol (VITAMIN D-3) 5000 UNITS TABS, Take 5,000 Units by mouth daily. , Disp: , Rfl:  .  cyclobenzaprine (FLEXERIL) 5 MG tablet, Take 1 tablet by mouth daily., Disp: , Rfl:  .  diazepam (VALIUM) 5 MG tablet, TAKE ONE (1) TABLET BY MOUTH EVERY SIX HOURS AS NEEDED, Disp: 30 tablet, Rfl: 3 .  doxycycline (VIBRA-TABS) 100 MG tablet, Take 1 tablet (100 mg total) by mouth 2 (two) times daily., Disp: 20 tablet, Rfl: 0 .  fluticasone (FLONASE) 50  MCG/ACT nasal spray, Place 1 spray into both nostrils daily., Disp: 16 g, Rfl: 3 .  furosemide (LASIX) 20 MG tablet, TAKE 1 TABLET BY MOUTH DAILY, Disp: 90 tablet, Rfl: 2 .  hydroxychloroquine (PLAQUENIL) 200 MG tablet, Take 400 mg by mouth every morning. , Disp: , Rfl:  .  hydrOXYzine (ATARAX/VISTARIL) 25 MG tablet, Take 1 tablet (25 mg total) by mouth every 6 (six) hours as needed for itching (insomnia)., Disp: 60 tablet, Rfl: 0 .  levothyroxine (SYNTHROID, LEVOTHROID) 88 MCG tablet, Take 1 tablet (88 mcg total) by mouth daily., Disp: 30 tablet, Rfl: 2 .  losartan (COZAAR) 50 MG tablet, Take 50 mg by mouth daily. 1/2 tab bid, Disp: , Rfl:  .  meloxicam (MOBIC) 7.5 MG tablet, Take 1 tablet (7.5 mg total) by mouth daily., Disp: 30 tablet, Rfl: 2 .  mometasone (ASMANEX) 220 MCG/INH inhaler, Inhale 2 puffs into the lungs daily., Disp: 1 Inhaler, Rfl: 0 .  omeprazole (PRILOSEC) 40 MG capsule, TAKE ONE (1) CAPSULE BY MOUTH  BEFORE BREAKFAST, Disp: 30 capsule, Rfl: 1 .  ONE TOUCH ULTRA TEST test strip, USE AS DIRECTED TO CHECK BLOOD SUGAR ONCE A DAY, Disp: 100 each, Rfl: 3 .  OVER THE COUNTER MEDICATION, Take 5 capsules by mouth daily. Ariix Optimals Vitamin & Minerals , Disp: , Rfl:  .  OVER THE COUNTER MEDICATION, Take 1 scoop by mouth daily. Ariix Magnecal D , Disp: , Rfl:  .  oxyCODONE-acetaminophen (PERCOCET/ROXICET) 5-325 MG tablet, Take 1 tablet by mouth every 8 (eight) hours as needed for severe pain., Disp: 60 tablet, Rfl: 0 .  spironolactone (ALDACTONE) 25 MG tablet, Take 1 tablet (25 mg total) by mouth daily., Disp: 90 tablet, Rfl: 1 .  topiramate (TOPAMAX) 25 MG tablet, Take 1 tablet (25 mg total) by mouth 2 (two) times daily., Disp: 60 tablet, Rfl: 3 .  Wheat Dextrin (BENEFIBER) POWD, Take 2 scoop by mouth daily as needed (for constipation). , Disp: , Rfl:    Allergies  Allergen Reactions  . Gabapentin (Once-Daily)     Depression   . Saxenda [Liraglutide -Weight Management] Other (See  Comments)    Depression   . Sulfa Antibiotics     itching  . Trintellix [Vortioxetine] Other (See Comments)    GI issues   . Adhesive [Tape] Rash  . Codeine Itching   Review of Systems   Pertinent items are noted in the HPI. Otherwise, ROS is negative.  Vitals:   Vitals:   10/13/17 0827  BP: 114/78  Pulse: 85  Temp: 98.6 F (37 C)  TempSrc: Oral  SpO2: 99%  Weight: 231 lb (104.8 kg)  Height: 5\' 3"  (1.6 m)     Body mass index is 40.92 kg/m.  Physical Exam:   Physical Exam  Constitutional: She appears well-nourished.  HENT:  Head: Normocephalic and atraumatic.  Eyes: Pupils are  equal, round, and reactive to light. EOM are normal.  Neck: Normal range of motion. Neck supple.  Cardiovascular: Normal rate, regular rhythm, normal heart sounds and intact distal pulses.  Pulmonary/Chest: Effort normal.  Abdominal: Soft.  Skin: Skin is warm. Rash noted.  Psychiatric: She has a normal mood and affect. Her behavior is normal.  Nursing note and vitals reviewed.    Assessment and Plan:   Serenia was seen today for rash.  Diagnoses and all orders for this visit:  Pruritic erythematous rash Comments: New patient appointment moved up to June 21. See below for interim treatment.  Orders: -     predniSONE (DELTASONE) 5 MG tablet; 6-6-5-5-4-4-3-3-2-2-1-1-0 -     triamcinolone (KENALOG) 0.025 % ointment; Apply 1 application topically 2 (two) times daily. -     hydrOXYzine (ATARAX/VISTARIL) 25 MG tablet; Take 1 tablet (25 mg total) by mouth at bedtime as needed for itching (insomnia).   . Reviewed expectations re: course of current medical issues. . Discussed self-management of symptoms. . Outlined signs and symptoms indicating need for more acute intervention. . Patient verbalized understanding and all questions were answered. Marland Kitchen Health Maintenance issues including appropriate healthy diet, exercise, and smoking avoidance were discussed with patient. . See orders for this  visit as documented in the electronic medical record. . Patient received an After Visit Summary.  CMA served as Neurosurgeon during this visit. History, Physical, and Plan performed by medical provider. The above documentation has been reviewed and is accurate and complete. Helane Rima, D.O.  Helane Rima, DO [place], [facility] 10/13/2017

## 2017-10-13 ENCOUNTER — Other Ambulatory Visit: Payer: Self-pay | Admitting: Family Medicine

## 2017-10-13 ENCOUNTER — Encounter: Payer: Self-pay | Admitting: Family Medicine

## 2017-10-13 ENCOUNTER — Ambulatory Visit: Payer: 59 | Admitting: Family Medicine

## 2017-10-13 ENCOUNTER — Ambulatory Visit: Payer: Self-pay | Admitting: Rehabilitative and Restorative Service Providers"

## 2017-10-13 VITALS — BP 114/78 | HR 85 | Temp 98.6°F | Ht 63.0 in | Wt 231.0 lb

## 2017-10-13 DIAGNOSIS — L298 Other pruritus: Secondary | ICD-10-CM | POA: Diagnosis not present

## 2017-10-13 MED ORDER — TRIAMCINOLONE ACETONIDE 0.025 % EX OINT: 1.0000 "application " | TOPICAL_OINTMENT | Freq: Two times a day (BID) | CUTANEOUS | 0 refills | Status: DC

## 2017-10-13 MED ORDER — HYDROXYZINE HCL 25 MG PO TABS: 25.0000 mg | ORAL_TABLET | Freq: Every evening | ORAL | 0 refills | Status: DC | PRN

## 2017-10-13 MED ORDER — PREDNISONE 5 MG PO TABS: ORAL_TABLET | ORAL | 0 refills | Status: DC

## 2017-10-16 ENCOUNTER — Ambulatory Visit: Payer: 59 | Admitting: Psychology

## 2017-10-16 DIAGNOSIS — F331 Major depressive disorder, recurrent, moderate: Secondary | ICD-10-CM | POA: Diagnosis not present

## 2017-10-30 ENCOUNTER — Ambulatory Visit: Payer: Self-pay | Admitting: Psychology

## 2017-11-05 ENCOUNTER — Encounter: Payer: Self-pay | Admitting: Rehabilitative and Restorative Service Providers"

## 2017-11-05 ENCOUNTER — Ambulatory Visit: Payer: 59 | Attending: Family Medicine | Admitting: Rehabilitative and Restorative Service Providers"

## 2017-11-05 DIAGNOSIS — R2689 Other abnormalities of gait and mobility: Secondary | ICD-10-CM

## 2017-11-05 DIAGNOSIS — M6281 Muscle weakness (generalized): Secondary | ICD-10-CM | POA: Diagnosis not present

## 2017-11-05 DIAGNOSIS — R2681 Unsteadiness on feet: Secondary | ICD-10-CM | POA: Diagnosis present

## 2017-11-05 DIAGNOSIS — R293 Abnormal posture: Secondary | ICD-10-CM | POA: Insufficient documentation

## 2017-11-05 NOTE — Patient Instructions (Signed)
Access Code: [email or web address]  URL: [email or web address]  Date: 11/05/2017  Prepared by: Margretta Ditty   Exercises Standing Shoulder Internal Rotation Stretch with Towel - 3 reps - 1 sets - 30 hold - 1x daily - 7x weekly Wall Sit - 10 reps - 1 sets - 5 hold - 1x daily - 7x weekly Shoulder Flexion and Extension with Coordinated Breathing - 10 reps - 1 sets - 3 breaths hold - 1x daily - 7x weekly Seated Diaphragmatic Breathing - 10 reps - 1 sets - 1x daily - 7x weekly

## 2017-11-05 NOTE — Therapy (Signed)
[facility] [address] [place], [place], [address] Phone: [phone]   Fax:  [phone]  Physical Therapy Treatment and Goal Update.  Patient Details  Name: Kristine Curtis MRN: [id number] Date of Birth: Mar 03, 1961 Referring Provider: Kristeen Miss, MD   Encounter Date: 11/05/2017  PT End of Session - 11/05/17 1308    Visit Number  6    Number of Visits  6    Date for PT Re-Evaluation  10/28/17    Authorization Type  [employer] private insurance (60 visit limit combined PT/OT/ST)    PT Start Time  1105    PT Stop Time  1145    PT Time Calculation (min)  40 min    Activity Tolerance  Patient limited by pain    Behavior During Therapy  [facility] for tasks assessed/performed       Past Medical History:  Diagnosis Date  . Allergic rhinitis due to pollen   . Asthma   . Chronic combined systolic and diastolic CHF (congestive heart failure) ([place])    06/2016 Echo: EF 40-45%, Gr1 DD  . Depression   . Family history of polycystic kidney with negative CT 11/16/2012  . Fibromyalgia   . GERD (gastroesophageal reflux disease)   . Hypertension   . Hypothyroidism   . IBS (irritable bowel syndrome)   . Internal hemorrhoids   . Morbid obesity ([place])   . NICM (nonischemic cardiomyopathy) ([place])    a. 1999 Cath: nl cors;  b. EF prev as low as 35%;  c. 11/2015 Echo: Ef 40-45%;  d. 06/2016 Echo: EF 40-45%;  e. Lexiscan MV: fixed anterosepta/inferseptal defect w/o ischemia, EF 35%.  . Osteoarthritis   . PVC (premature ventricular contraction)   . Restless leg syndrome   . Sicca ([place]) 11/22/2014  . Vertigo     Past Surgical History:  Procedure Laterality Date  . ABDOMINAL HYSTERECTOMY    . CESAREAN SECTION    . CHOLECYSTECTOMY  10-2008  . COLONOSCOPY    . ESOPHAGOGASTRODUODENOSCOPY    . EYE SURGERY     2 2013 and 1981/DCR of right eye 05/2014  . LUMBAR DISC SURGERY  2016   L3-4  . RIGHT/LEFT HEART CATH AND CORONARY ANGIOGRAPHY N/A  07/11/2016   Procedure: Right/Left Heart Cath and Coronary Angiography;  Surgeon: Peter M Martinique, MD;  Location: [facility];  Service: Cardiovascular;  Laterality: N/A;  . SHOULDER ARTHROSCOPY W/ SUBACROMIAL DECOMPRESSION AND DISTAL CLAVICLE EXCISION Right   . SPINE SURGERY     C6-C7, 03/2017  . TUBAL LIGATION  1992    There were no vitals filed for this visit.  Subjective Assessment - 11/05/17 1108    Subjective  The patient reports that she is utilizing the senior center and enjoying classes.  She has a question about hos she is doing squats at the senior center.    She wants help to do squats and is worried it will cause her low back (SI and sciatica pain per patient).  "Yoga is my new best friend."  She reports she is doing yoga on the mat (began with chair, Tabatha does mat yoga).   "I stay tender a lot".  She notes some experience with complex migraines since undergoing procedure (SPG) for migraines.  She notes other experiences with migraines iwth spatial issues.      Pertinent History  systolic and diastolic heart failure, history of PACs and PVCs by Holter monitor, fibromyalgia, restless leg syndrome, hypertension, depression, skin  rash persistent.    Patient Stated Goals  "figure out how to stretch my low back as much as I can." Work on balance and continue general strengthening.    Currently in Pain?  Yes    Pain Score  6     Pain Location  Back    Pain Orientation  Lower    Pain Descriptors / Indicators  Aching;Tender    Pain Type  Chronic pain    Pain Onset  More than a month ago    Pain Frequency  Constant    Aggravating Factors   "learning that motion can help me work through my pain"    Pain Relieving Factors  stretching, acupuncture                       [facility] Adult PT Treatment/Exercise - 11/05/17 1120      Self-Care   Self-Care  Other Self-Care Comments    Other Self-Care Comments   Discussed community exercises and need for help with squats, and  IR stretch.        Neuro Re-ed    Neuro Re-ed Details   Standing working on single limb stance and ":tree" pose techniques.  PT demonstrated visual fixation, arms abducted for balance, and how to modify foot position for support.      Exercises   Exercises  Other Exercises    Other Exercises   Squats:    modified by doing wall slides in place of squats.  Patient demonstrated standing in front of chair and moving into squat (as if she would sit in chair).  She uses hip ER to avoid knee pressure.  PT recommended she start with wall slide instead.  IR shoulder:  towel stretch standing with cues on positioning.  Patient inquired about breathign exercises- worked on resisted diaphragmatic breathing.             PT Education - 11/05/17 1307    Education Details  HEP: IR stretch with towel, wall squat (to modify community center squats), breathing exercises    Person(s) Educated  Patient    Methods  Explanation;Demonstration;Handout    Comprehension  Verbalized understanding       PT Short Term Goals - 09/15/17 0901      PT SHORT TERM GOAL #1   Title  The patient will be indep with HEP for deep hip stretching, low back stretching, core stability, general conditioning, and balance.    Time  4    Period  Weeks    Status  Achieved      PT SHORT TERM GOAL #2   Title  The patient will improve LE strength for bilateral hip flexion to 4/5.    Baseline  3/5 at re-evaluation; on 09/15/17 3/5 R and 3+/5 left.    Time  4    Period  Weeks    Status  Not Met      PT SHORT TERM GOAL #3   Title  The patient will improve R hip abduction to 4/5 strength.    Baseline  R hip 4/5, L hip 4/5.    Time  4    Period  Weeks    Status  Achieved        PT Long Term Goals - 11/05/17 1311      PT LONG TERM GOAL #1   Title  The patient will verbalize understanding of progression of HEP/community wellness for long term participation in exercise.  Time  6    Period  Weeks    Status  Achieved       PT LONG TERM GOAL #2   Title  The patient will verbalize understanding of chronic pain management for long term mgmt of symptoms.    Time  6    Period  Weeks    Status  Achieved      UPDATED for LTGs: PT Long Term Goals - 11/05/17 1419      PT LONG TERM GOAL #1   Title  The patient will f/u in 30 days with continued participation in community classes and will be able to demonstrate modifications to community activities in order to tolerate participation.     Time  6    Period  Weeks    Status  New    Target Date  12/20/17            Plan - 11/05/17 1416    Clinical Impression Statement  The patient is participating in community exercise at least 3 days/week at this time.  She is doing yoga and a balance/mobility class.  She continues to have visual/perceptual issues associated with complex migraines.  PT to continue x 1 more month in order to do one further f/u to ensure community class going well.     PT Treatment/Interventions  ADLs/Self Care Home Management;Therapeutic exercise;Therapeutic activities;Manual techniques;Patient/family education;Gait training;Functional mobility training;Balance training;Neuromuscular re-education;Vestibular    PT Next Visit Plan  f/u in 30 days to answer questions regarding community classes.     Consulted and Agree with Plan of Care  Patient       Patient will benefit from skilled therapeutic intervention in order to improve the following deficits and impairments:  Abnormal gait, Obesity, Decreased strength, Pain, Decreased balance, Difficulty walking, Postural dysfunction, Impaired flexibility  Visit Diagnosis: Muscle weakness (generalized)  Abnormal posture  Other abnormalities of gait and mobility  Unsteadiness on feet     Problem List Patient Active Problem List   Diagnosis Date Noted  . Myofascial pain dysfunction syndrome 07/14/2017  . Blind right eye 07/14/2017  . Cervical radiculopathy 07/14/2017  . Degenerative arthritis  of left knee, XRAY12/2018 07/13/2017  . Dizziness 03/01/2017  . Morbid obesity ([place]) 02/24/2017  . Sjogren's disease ([place]) 02/24/2017  . Dense breasts 02/03/2017  . ETD (eustachian tube dysfunction) 01/21/2017  . Fatigue 01/21/2017  . Failed back syndrome of lumbar spine 12/30/2016  . Chronic low back pain with sciatica 05/02/2015  . Chronic systolic congestive heart failure ([place]), EF 35-40% 03/22/2015  . Sicca ([place]) 11/22/2014  . Family history of polycystic kidney 11/16/2012  . Vitamin D deficiency, on daily vitamin D 10/09/2009  . Hyperlipidemia, with high triglycerides and low HDL, not on statin 11/15/2008  . Depression with anxiety 08/31/2008  . Essential hypertension, on Losartan, Coreg, and Spironolactone 08/31/2008  . Allergic rhinitis, on Flonase 08/31/2008  . Asthma, Symbicort 08/31/2008  . GERD 08/31/2008  . Osteoarthritis 08/31/2008  . Hypothyroidism, on Levothyroxine 08/30/2008  . Classical migraine without intractable migraine 08/30/2008  . Abnormal Pap smear of cervix 05/07/1995    Rochester Serpe , PT 11/05/2017, 2:18 PM  [facility] [address] [place], [place], [address] Phone: [phone]   Fax:  [phone]  Name: Kristine Curtis MRN: [id number] Date of Birth: 07/06/1960

## 2017-11-08 NOTE — Progress Notes (Signed)
Kristine Curtis is a 57 y.o. female is here for follow up.  History of Present Illness:   HPI: See Assessment and Plan section for Problem Based Charting of issues discussed today.   There are no preventive care reminders to display for this patient. Depression screen [facility] 2/9 11/10/2017 08/11/2017 06/09/2017  Decreased Interest 0 1 0  Down, Depressed, Hopeless 0 1 0  PHQ - 2 Score 0 2 0  Altered sleeping 1 0 3  Tired, decreased energy 2 1 2   Change in appetite 1 0 0  Feeling bad or failure about yourself  0 0 0  Trouble concentrating 2 3 3   Moving slowly or fidgety/restless - 0 0  Suicidal thoughts 0 - 0  PHQ-9 Score 6 6 8   Difficult doing work/chores Somewhat difficult Somewhat difficult -  Some recent data might be hidden   PMHx, SurgHx, SocialHx, FamHx, Medications, and Allergies were reviewed in the Visit Navigator and updated as appropriate.   Patient Active Problem List   Diagnosis Date Noted  . IBS (irritable bowel syndrome) 11/10/2017  . B12 deficiency 11/10/2017  . OSA (obstructive sleep apnea) 11/10/2017  . Myalgia due to statin 11/10/2017  . Lumbar disc disease 10/01/2017  . RLS (restless legs syndrome) 10/01/2017  . Myofascial pain dysfunction syndrome 07/14/2017  . Blind right eye 07/14/2017  . Cervical radiculopathy 07/14/2017  . Degenerative arthritis of left knee, XRAY12/2018 07/13/2017  . Dizziness 03/01/2017  . Morbid obesity ([facility]) 02/24/2017  . Sjogren's disease ([facility]) 02/24/2017  . Dense breasts 02/03/2017  . Chronic fatigue syndrome with fibromyalgia 01/21/2017  . Failed back syndrome of lumbar spine 12/30/2016  . Chronic low back pain with sciatica 05/02/2015  . Chronic systolic congestive heart failure ([facility]), EF 35-40% 03/22/2015  . Sicca ([facility]) 11/22/2014  . Family history of polycystic kidney, with negative CT 11/16/2012  . Vitamin D deficiency, on daily vitamin D 10/09/2009  . Hyperlipidemia, with high triglycerides and low HDL, not on statin  11/15/2008  . Depression with anxiety 08/31/2008  . Essential hypertension, on Losartan, Coreg, and Spironolactone 08/31/2008  . Allergic rhinitis, on Flonase 08/31/2008  . Asthma, Symbicort 08/31/2008  . GERD, on Ompeprazole 08/31/2008  . Osteoarthritis 08/31/2008  . Hypothyroidism, on Levothyroxine 08/30/2008  . Classical migraine without intractable migraine 08/30/2008  . Abnormal Pap smear of cervix 05/07/1995   Social History   Tobacco Use  . Smoking status: Former Smoker    Packs/day: 2.00    Years: 12.00    Pack years: 24.00    Types: Cigarettes    Last attempt to quit: 05/06/1989    Years since quitting: 28.5  . Smokeless tobacco: Never Used  . Tobacco comment: quit smoking 1990  Substance Use Topics  . Alcohol use: No    Alcohol/week: 0.0 oz  . Drug use: No   Current Medications and Allergies:   Current Outpatient Medications:  .  ASMANEX 60 METERED DOSES 220 MCG/INH inhaler, INHALE 2 PUFFS INTO LUNGS EVERY DAY, Disp: 1 Inhaler, Rfl: 6 .  buPROPion (WELLBUTRIN XL) 300 MG 24 hr tablet, TAKE 1 TABLET BY MOUTH DAILY, Disp: 30 tablet, Rfl: 11 .  carvedilol (COREG) 25 MG tablet, Take 1/2 tablet ( 12.5 mg ) twice a day, Disp: 60 tablet, Rfl: 6 .  Cholecalciferol (VITAMIN D-3) 5000 UNITS TABS, Take 5,000 Units by mouth daily. , Disp: , Rfl:  .  cyclobenzaprine (FLEXERIL) 5 MG tablet, Take 1 tablet by mouth daily., Disp: , Rfl:  .  diazepam (VALIUM) 5 MG tablet, TAKE ONE (1) TABLET BY MOUTH EVERY SIX HOURS AS NEEDED, Disp: 30 tablet, Rfl: 3 .  fluticasone (FLONASE) 50 MCG/ACT nasal spray, Place 1 spray into both nostrils daily., Disp: 16 g, Rfl: 3 .  furosemide (LASIX) 20 MG tablet, TAKE 1 TABLET BY MOUTH DAILY, Disp: 90 tablet, Rfl: 2 .  Halobetasol Propionate (BRYHALI) 0.01 % LOTN, Apply topically as needed., Disp: , Rfl:  .  hydroxychloroquine (PLAQUENIL) 200 MG tablet, Take 400 mg by mouth every morning. , Disp: , Rfl:  .  hydrOXYzine (ATARAX/VISTARIL) 25 MG tablet, Take 1  tablet (25 mg total) by mouth at bedtime as needed for itching (insomnia)., Disp: 60 tablet, Rfl: 0 .  levothyroxine (SYNTHROID, LEVOTHROID) 88 MCG tablet, Take 1 tablet (88 mcg total) by mouth daily., Disp: 30 tablet, Rfl: 2 .  losartan (COZAAR) 50 MG tablet, Take 50 mg by mouth daily. 1/2 tab bid, Disp: , Rfl:  .  meloxicam (MOBIC) 7.5 MG tablet, Take 1 tablet (7.5 mg total) by mouth daily., Disp: 30 tablet, Rfl: 2 .  omeprazole (PRILOSEC) 40 MG capsule, TAKE 1 CAPSULE BY MOUTH EVERY DAY BEFOREBREAKFAST, Disp: 30 capsule, Rfl: 1 .  OVER THE COUNTER MEDICATION, Take 5 capsules by mouth daily. Ariix Optimals Vitamin & Minerals , Disp: , Rfl:  .  OVER THE COUNTER MEDICATION, Take 1 scoop by mouth daily. Ariix Magnecal D , Disp: , Rfl:  .  oxyCODONE-acetaminophen (PERCOCET/ROXICET) 5-325 MG tablet, Take 1 tablet by mouth every 8 (eight) hours as needed for severe pain., Disp: 60 tablet, Rfl: 0 .  spironolactone (ALDACTONE) 25 MG tablet, Take 1 tablet (25 mg total) by mouth daily., Disp: 90 tablet, Rfl: 1 .  topiramate (TOPAMAX) 25 MG tablet, Take 1 tablet (25 mg total) by mouth 2 (two) times daily., Disp: 60 tablet, Rfl: 3 .  triamcinolone (KENALOG) 0.025 % ointment, Apply 1 application topically 2 (two) times daily., Disp: 454 g, Rfl: 0 .  Wheat Dextrin (BENEFIBER) POWD, Take 2 scoop by mouth daily as needed (for constipation). , Disp: , Rfl:    Allergies  Allergen Reactions  . Gabapentin (Once-Daily)     Depression   . Saxenda [Liraglutide -Weight Management] Other (See Comments)    Depression   . Sulfa Antibiotics     itching  . Trintellix [Vortioxetine] Other (See Comments)    GI issues   . Adhesive [Tape] Rash  . Codeine Itching   Review of Systems   Pertinent items are noted in the HPI. Otherwise, ROS is negative.  Vitals:   Vitals:   11/10/17 0803  BP: 120/80  Pulse: 92  Temp: 98.6 F (37 C)  TempSrc: Oral  SpO2: 92%  Weight: 231 lb 9.6 oz (105.1 kg)  Height: 5\' 3"  (1.6  m)     Body mass index is 41.03 kg/m.  Physical Exam:   Physical Exam  Constitutional: She appears well-nourished.  HENT:  Head: Normocephalic and atraumatic.  Eyes: Pupils are equal, round, and reactive to light. EOM are normal.  Neck: Normal range of motion. Neck supple.  Cardiovascular: Normal rate, regular rhythm, normal heart sounds and intact distal pulses.  Pulmonary/Chest: Effort normal.  Abdominal: Soft.  Skin: Skin is warm.  Psychiatric: She has a normal mood and affect. Her behavior is normal.  Nursing note and vitals reviewed.    Results for orders placed or performed in visit on 08/11/17  Comprehensive metabolic panel  Result Value Ref Range   Sodium 141  135 - 145 mEq/L   Potassium 4.2 3.5 - 5.1 mEq/L   Chloride 108 96 - 112 mEq/L   CO2 27 19 - 32 mEq/L   Glucose, Bld 93 70 - 99 mg/dL   BUN 12 6 - 23 mg/dL   Creatinine, Ser 1.61 0.40 - 1.20 mg/dL   Total Bilirubin 0.5 0.2 - 1.2 mg/dL   Alkaline Phosphatase 95 39 - 117 U/L   AST 23 0 - 37 U/L   ALT 23 0 - 35 U/L   Total Protein 6.5 6.0 - 8.3 g/dL   Albumin 4.0 3.5 - 5.2 g/dL   Calcium 9.3 8.4 - 09.6 mg/dL   GFR 04.54 >09.81 mL/min    Assessment and Plan:   Kassia was seen today for follow-up.  Diagnoses and all orders for this visit:  Classical migraine without intractable migraine Comments: Significant increase in this recently. Followed by Dr. [name]. Considering pain scrambler.   Gastroesophageal reflux disease without esophagitis  Primary osteoarthritis involving multiple joints -     Ambulatory referral to Physical Therapy  Depression with anxiety Comments: Working with LCSW and doing well.   Mixed hyperlipidemia Comments: No statin due to myalgias. Recommended trial of red yeast rice and fiber.   Morbid obesity (HCC)  Vitamin D deficiency, on daily vitamin D  Moderate persistent asthma without complication  Acquired hypothyroidism  Allergic rhinitis, unspecified seasonality,  unspecified trigger  Essential hypertension, on Losartan, Coreg, and Spironolactone  Irritable bowel syndrome, controlled with fiber and probiotics  B12 deficiency  OSA (obstructive sleep apnea)  Pruritic erythematous rash Comments: Now followed by Dermatology and improving. Dx as being "hypersensitive." Improving with Triamcinolone/Cerave compounded cream. Orders: -     hydrOXYzine (ATARAX/VISTARIL) 25 MG tablet; Take 1 tablet (25 mg total) by mouth at bedtime as needed for itching (insomnia).  Myalgia due to statin  Myofascial pain dysfunction syndrome Comments: Improving. Exercising. Holding pain medication until 7-8/10 pain.   Balance problem Comments: Working with PT Trula Ore. Has been incredibly helpful.   Chronic right-sided low back pain with right-sided sciatica Comments: Nerve conduction studies WNL.     Marland Kitchen Reviewed expectations re: course of current medical issues. . Discussed self-management of symptoms. . Outlined signs and symptoms indicating need for more acute intervention. . Patient verbalized understanding and all questions were answered. Marland Kitchen Health Maintenance issues including appropriate healthy diet, exercise, and smoking avoidance were discussed with patient. . See orders for this visit as documented in the electronic medical record. . Patient received an After Visit Summary.  Helane Rima, DO [place] 11/10/2017  Future Appointments  Date Time Provider Department Center  11/13/2017  2:00 PM Marlinda Mike, [place] [facility] None  12/12/2017  2:00 PM Berneice Heinrich, PT [facility]  02/10/2018  9:00 AM Helane Rima, DO [facility]

## 2017-11-10 ENCOUNTER — Encounter: Payer: Self-pay | Admitting: Family Medicine

## 2017-11-10 ENCOUNTER — Ambulatory Visit: Payer: 59 | Admitting: Family Medicine

## 2017-11-10 VITALS — BP 120/80 | HR 92 | Temp 98.6°F | Ht 63.0 in | Wt 231.6 lb

## 2017-11-10 DIAGNOSIS — L298 Other pruritus: Secondary | ICD-10-CM

## 2017-11-10 DIAGNOSIS — M5441 Lumbago with sciatica, right side: Secondary | ICD-10-CM

## 2017-11-10 DIAGNOSIS — F418 Other specified anxiety disorders: Secondary | ICD-10-CM | POA: Diagnosis not present

## 2017-11-10 DIAGNOSIS — L2989 Other pruritus: Secondary | ICD-10-CM

## 2017-11-10 DIAGNOSIS — T466X5A Adverse effect of antihyperlipidemic and antiarteriosclerotic drugs, initial encounter: Secondary | ICD-10-CM | POA: Insufficient documentation

## 2017-11-10 DIAGNOSIS — E559 Vitamin D deficiency, unspecified: Secondary | ICD-10-CM

## 2017-11-10 DIAGNOSIS — R2689 Other abnormalities of gait and mobility: Secondary | ICD-10-CM

## 2017-11-10 DIAGNOSIS — E538 Deficiency of other specified B group vitamins: Secondary | ICD-10-CM | POA: Insufficient documentation

## 2017-11-10 DIAGNOSIS — J309 Allergic rhinitis, unspecified: Secondary | ICD-10-CM

## 2017-11-10 DIAGNOSIS — M791 Myalgia, unspecified site: Secondary | ICD-10-CM

## 2017-11-10 DIAGNOSIS — M7918 Myalgia, other site: Secondary | ICD-10-CM

## 2017-11-10 DIAGNOSIS — G8929 Other chronic pain: Secondary | ICD-10-CM

## 2017-11-10 DIAGNOSIS — G43109 Migraine with aura, not intractable, without status migrainosus: Secondary | ICD-10-CM

## 2017-11-10 DIAGNOSIS — J4540 Moderate persistent asthma, uncomplicated: Secondary | ICD-10-CM

## 2017-11-10 DIAGNOSIS — K219 Gastro-esophageal reflux disease without esophagitis: Secondary | ICD-10-CM | POA: Diagnosis not present

## 2017-11-10 DIAGNOSIS — M150 Primary generalized (osteo)arthritis: Secondary | ICD-10-CM

## 2017-11-10 DIAGNOSIS — M159 Polyosteoarthritis, unspecified: Secondary | ICD-10-CM

## 2017-11-10 DIAGNOSIS — M8949 Other hypertrophic osteoarthropathy, multiple sites: Secondary | ICD-10-CM

## 2017-11-10 DIAGNOSIS — I10 Essential (primary) hypertension: Secondary | ICD-10-CM

## 2017-11-10 DIAGNOSIS — E039 Hypothyroidism, unspecified: Secondary | ICD-10-CM

## 2017-11-10 DIAGNOSIS — G4733 Obstructive sleep apnea (adult) (pediatric): Secondary | ICD-10-CM

## 2017-11-10 DIAGNOSIS — K589 Irritable bowel syndrome without diarrhea: Secondary | ICD-10-CM

## 2017-11-10 DIAGNOSIS — E782 Mixed hyperlipidemia: Secondary | ICD-10-CM

## 2017-11-10 MED ORDER — HYDROXYZINE HCL 25 MG PO TABS: 25.0000 mg | ORAL_TABLET | Freq: Every evening | ORAL | 0 refills | Status: DC | PRN

## 2017-11-10 NOTE — Patient Instructions (Signed)
Try Red yeast Rice.   Try taking benefiber daily.

## 2017-11-11 ENCOUNTER — Other Ambulatory Visit: Payer: Self-pay | Admitting: Family Medicine

## 2017-11-11 DIAGNOSIS — H6983 Other specified disorders of Eustachian tube, bilateral: Secondary | ICD-10-CM

## 2017-11-13 ENCOUNTER — Ambulatory Visit (INDEPENDENT_AMBULATORY_CARE_PROVIDER_SITE_OTHER): Payer: 59 | Admitting: Psychology

## 2017-11-13 DIAGNOSIS — F331 Major depressive disorder, recurrent, moderate: Secondary | ICD-10-CM | POA: Diagnosis not present

## 2017-11-28 DIAGNOSIS — B353 Tinea pedis: Secondary | ICD-10-CM | POA: Insufficient documentation

## 2017-12-10 ENCOUNTER — Ambulatory Visit: Payer: 59 | Admitting: Psychology

## 2017-12-10 ENCOUNTER — Other Ambulatory Visit: Payer: Self-pay | Admitting: Family Medicine

## 2017-12-10 DIAGNOSIS — F331 Major depressive disorder, recurrent, moderate: Secondary | ICD-10-CM

## 2017-12-11 ENCOUNTER — Other Ambulatory Visit: Payer: Self-pay

## 2017-12-11 MED ORDER — MELOXICAM 7.5 MG PO TABS: 7.5000 mg | ORAL_TABLET | Freq: Every day | ORAL | 2 refills | Status: DC

## 2017-12-12 ENCOUNTER — Encounter: Payer: Self-pay | Admitting: Rehabilitative and Restorative Service Providers"

## 2017-12-12 ENCOUNTER — Ambulatory Visit: Payer: 59 | Attending: Family Medicine | Admitting: Rehabilitative and Restorative Service Providers"

## 2017-12-12 DIAGNOSIS — R2681 Unsteadiness on feet: Secondary | ICD-10-CM | POA: Diagnosis present

## 2017-12-12 DIAGNOSIS — M6281 Muscle weakness (generalized): Secondary | ICD-10-CM | POA: Diagnosis not present

## 2017-12-12 DIAGNOSIS — R2689 Other abnormalities of gait and mobility: Secondary | ICD-10-CM | POA: Diagnosis present

## 2017-12-12 DIAGNOSIS — R293 Abnormal posture: Secondary | ICD-10-CM | POA: Diagnosis present

## 2017-12-12 NOTE — Therapy (Addendum)
[facility] [address] [place], [place], [address] Phone: [phone]   Fax:  [phone]  Physical Therapy Treatment  Patient Details  Name: Kristine Curtis MRN: [id number] Date of Birth: [birth year] Referring Provider: Kristeen Miss, MD   Encounter Date: 12/12/2017  PT End of Session - 12/12/17 1523    Visit Number  7    Number of Visits  8    Date for PT Re-Evaluation  12/20/17    Authorization Type  [employer] private insurance (60 visit limit combined PT/OT/ST)    PT Start Time  1410    PT Stop Time  1500    PT Time Calculation (min)  50 min    Activity Tolerance  Patient limited by pain    Behavior During Therapy  [facility] for tasks assessed/performed       Past Medical History:  Diagnosis Date  . Allergic rhinitis due to pollen   . Asthma   . Chronic combined systolic and diastolic CHF (congestive heart failure) ([place])    06/2016 Echo: EF 40-45%, Gr1 DD  . Depression   . Family history of polycystic kidney with negative CT 11/16/2012  . Fibromyalgia   . GERD (gastroesophageal reflux disease)   . Hypertension   . Hypothyroidism   . IBS (irritable bowel syndrome)   . Internal hemorrhoids   . Morbid obesity ([place])   . NICM (nonischemic cardiomyopathy) ([place])    a. 1999 Cath: nl cors;  b. EF prev as low as 35%;  c. 11/2015 Echo: Ef 40-45%;  d. 06/2016 Echo: EF 40-45%;  e. Lexiscan MV: fixed anterosepta/inferseptal defect w/o ischemia, EF 35%.  . Osteoarthritis   . PVC (premature ventricular contraction)   . Restless leg syndrome   . Sicca ([place]) 11/22/2014  . Vertigo     Past Surgical History:  Procedure Laterality Date  . ABDOMINAL HYSTERECTOMY    . CESAREAN SECTION    . CHOLECYSTECTOMY  10-2008  . COLONOSCOPY    . ESOPHAGOGASTRODUODENOSCOPY    . EYE SURGERY     2 2013 and 1981/DCR of right eye 05/2014  . LUMBAR DISC SURGERY  2016   L3-4  . RIGHT/LEFT HEART CATH AND CORONARY ANGIOGRAPHY N/A 07/11/2016    Procedure: Right/Left Heart Cath and Coronary Angiography;  Surgeon: Peter M Martinique, MD;  Location: [facility];  Service: Cardiovascular;  Laterality: N/A;  . SHOULDER ARTHROSCOPY W/ SUBACROMIAL DECOMPRESSION AND DISTAL CLAVICLE EXCISION Right   . SPINE SURGERY     C6-C7, 03/2017  . TUBAL LIGATION  1992    There were no vitals filed for this visit.  Subjective Assessment - 12/12/17 1411    Subjective  The patient reports that she overdid it and has irritated her chronic fatigue symptoms.  She reports she helped plan her daughter in [place] baby shower and this was 2 weeks ago.  She notes she was busier than usual for 4 weeks doing party favors, baby shower planning.  She reports that she has not been on a good schedule with exercise because of this.  She notes chronic fatigue symptoms reporting "not so much body pain because I've learned to manage my body pain. I have 8 hours I can stay busy, I can do light housework, but she needs to sleep 12-14 hours/day right now."    Her balance issue is "totally with my headaches" so she feels that is a migrainous symptom for her.      Pertinent History  systolic  and diastolic heart failure, history of PACs and PVCs by Holter monitor, fibromyalgia, restless leg syndrome, hypertension, depression, skin rash persistent.    Patient Stated Goals  "figure out how to stretch my low back as much as I can." Work on balance and continue general strengthening.    Currently in Pain?  Yes    Pain Score  3     Pain Location  Back    Pain Orientation  Lower    Pain Descriptors / Indicators  Sore;Aching    Pain Type  Chronic pain    Pain Onset  More than a month ago    Pain Frequency  Constant    Aggravating Factors   intermittent    Pain Relieving Factors  takes Tylenol when at a 6 or a 7                       [facility] Adult PT Treatment/Exercise - 12/12/17 1424      Self-Care   Self-Care  Other Self-Care Comments    Other Self-Care Comments    Shoulder discomfort:  She notes some stiffness in shoulders and she tries to stretch, but notes she cannot push too far without pain.  She reports she has not gone to the community center due to changes in her sleep habits and needing to sleep for longer periods.   She is exercising at home.   She is waking t/o the night and this is keeping her from getting up in time to workout.  She has increased the intensity of her home exercises due to being exposed to exercises at the community center.        Exercises   Exercises  Other Exercises    Other Exercises   Discussed activities that the patient was doing at the community center that she is now doing at home.  She notes she cannot do down dog in yoga class.  We discussed and demonstrated/performed modification to include using a chair to avoid too much pressure through UEs, use a countertop (no chairs are in class at community center), or use blocks.  Patient is able to do with yoga blocks, but does feel a strain on shoulders.  We also tried moving into down dog from quadriped and the patient c/o pain in wrists and elbows.                    She is doing all other HEP using them as needed.  PT discussed to continue to use exercise as "tool" depending on what her needs are.  We also discussed that due to medical issues and flare ups at times, she may have to modify her routine and this does not mean she should stop exercise.               PT Education - 12/12/17 1708    Education Details  modifications of yoga positions (see note) and plan for patient to continue to perform current program    Person(s) Educated  Patient    Methods  Explanation;Demonstration    Comprehension  Verbalized understanding;Returned demonstration       PT Short Term Goals - 09/15/17 0901      PT SHORT TERM GOAL #1   Title  The patient will be indep with HEP for deep hip stretching, low back stretching, core stability, general conditioning, and balance.    Time  4     Period  Weeks  Status  Achieved      PT SHORT TERM GOAL #2   Title  The patient will improve LE strength for bilateral hip flexion to 4/5.    Baseline  3/5 at re-evaluation; on 09/15/17 3/5 R and 3+/5 left.    Time  4    Period  Weeks    Status  Not Met      PT SHORT TERM GOAL #3   Title  The patient will improve R hip abduction to 4/5 strength.    Baseline  R hip 4/5, L hip 4/5.    Time  4    Period  Weeks    Status  Achieved        PT Long Term Goals - 12/12/17 1532      PT LONG TERM GOAL #1   Title  The patient will f/u in 30 days with continued participation in community classes and will be able to demonstrate modifications to community activities in order to tolerate participation.     Baseline  Not as consistent this month with community exercise.    Time  6    Period  Weeks    Status  Partially Met            Plan - 12/12/17 1717    Clinical Impression Statement  The patient was consistent in the month of June with community exercise, however she has had an exacerbation of chronic fatigue and has gotten out of her routine.  PT encouraged her to work towards getting back into the community exercises classes as she is able.  She has increased the intensity of her home workouts and is now incorporating yoga, strengthening, stretching, and walking into her daily routine.  Her dizziness/imbalance appears to coincide with migraines.  The patient expresses fear of not having more PT visits scheduled.  PT and patient discussed that she is doing a great job using the tools that we have reviewed in therapy and she has a good goal for community activites.    I recommended we "hold" further visits versus a formal d/c so she can call to schedule over the next 2 months if any questions/issues arise.  We discussed that she will continue to have good and bad days and she can modify her routine as needed.  We also discussed speaking with her counselor about prioritizing her exercises and  stress management as increased stress tends to lead to an increase in chronic fatigue or inflammatory conditions for her.  Patient notes they are discussing.     PT Treatment/Interventions  ADLs/Self Care Home Management;Therapeutic exercise;Therapeutic activities;Manual techniques;Patient/family education;Gait training;Functional mobility training;Balance training;Neuromuscular re-education;Vestibular    PT Next Visit Plan  on hold, will f/u within next 2 months if patient has further questions/issues.  Encouraged patient to try to use tools provided in therapy to work through obstacles and emphasized returning to community classes and keeping a routine.     Consulted and Agree with Plan of Care  Patient       Patient will benefit from skilled therapeutic intervention in order to improve the following deficits and impairments:  Abnormal gait, Obesity, Decreased strength, Pain, Decreased balance, Difficulty walking, Postural dysfunction, Impaired flexibility  Visit Diagnosis: Muscle weakness (generalized)  Abnormal posture  Other abnormalities of gait and mobility  Unsteadiness on feet     Problem List Patient Active Problem List   Diagnosis Date Noted  . Tinea pedis 11/28/2017  . IBS (irritable bowel syndrome) 11/10/2017  . B12  deficiency 11/10/2017  . OSA (obstructive sleep apnea) 11/10/2017  . Myalgia due to statin 11/10/2017  . Lumbar disc disease 10/01/2017  . RLS (restless legs syndrome) 10/01/2017  . Myofascial pain dysfunction syndrome 07/14/2017  . Blind right eye 07/14/2017  . Cervical radiculopathy 07/14/2017  . Degenerative arthritis of left knee, XRAY12/2018 07/13/2017  . Dizziness 03/01/2017  . Morbid obesity ([place]) 02/24/2017  . Sjogren's disease ([place]) 02/24/2017  . Dense breasts 02/03/2017  . Chronic fatigue syndrome with fibromyalgia 01/21/2017  . Failed back syndrome of lumbar spine 12/30/2016  . Chronic low back pain with sciatica 05/02/2015  . Chronic  systolic congestive heart failure ([place]), EF 35-40% 03/22/2015  . Sicca ([place]) 11/22/2014  . Family history of polycystic kidney, with negative CT 11/16/2012  . Vitamin D deficiency, on daily vitamin D 10/09/2009  . Hyperlipidemia, with high triglycerides and low HDL, not on statin 11/15/2008  . Depression with anxiety 08/31/2008  . Essential hypertension, on Losartan, Coreg, and Spironolactone 08/31/2008  . Allergic rhinitis, on Flonase 08/31/2008  . Asthma, Symbicort 08/31/2008  . GERD, on Ompeprazole 08/31/2008  . Osteoarthritis 08/31/2008  . Hypothyroidism, on Levothyroxine 08/30/2008  . Classical migraine without intractable migraine 08/30/2008  . Abnormal Pap smear of cervix 05/07/1995    *Patient did not return for further follow-up/ addended to add d/c summary  PHYSICAL THERAPY DISCHARGE SUMMARY  Visits from Start of Care: 7  Current functional level related to goals / functional outcomes: See above   Remaining deficits: Chronic pain migraines   Education / Equipment: Home program, community exercise, safety.  Plan: Patient agrees to discharge.  Patient goals were partially met. Patient is being discharged due to meeting the stated rehab goals.  ?????         Thank you for the referral of this patient. Rudell Cobb, MPT   [place], [place] 12/12/2017, 5:24 PM  [facility] [address] [place], [place], [address] Phone: [phone]   Fax:  [phone]  Name: Kristine Curtis MRN: [id number] Date of Birth: April 11, 1961

## 2017-12-22 DIAGNOSIS — G5601 Carpal tunnel syndrome, right upper limb: Secondary | ICD-10-CM

## 2017-12-22 HISTORY — DX: Carpal tunnel syndrome, right upper limb: G56.01

## 2018-01-06 ENCOUNTER — Other Ambulatory Visit: Payer: Self-pay | Admitting: Family Medicine

## 2018-01-06 DIAGNOSIS — M5441 Lumbago with sciatica, right side: Principal | ICD-10-CM

## 2018-01-06 DIAGNOSIS — G8929 Other chronic pain: Secondary | ICD-10-CM

## 2018-01-06 DIAGNOSIS — M5412 Radiculopathy, cervical region: Secondary | ICD-10-CM

## 2018-01-07 ENCOUNTER — Ambulatory Visit: Payer: 59 | Admitting: Psychology

## 2018-01-07 DIAGNOSIS — F331 Major depressive disorder, recurrent, moderate: Secondary | ICD-10-CM | POA: Diagnosis not present

## 2018-01-07 NOTE — Telephone Encounter (Signed)
Please advise on refill.

## 2018-01-12 ENCOUNTER — Other Ambulatory Visit: Payer: Self-pay | Admitting: Family Medicine

## 2018-01-17 ENCOUNTER — Other Ambulatory Visit: Payer: Self-pay | Admitting: Family Medicine

## 2018-01-19 ENCOUNTER — Other Ambulatory Visit: Payer: Self-pay | Admitting: Surgical

## 2018-01-19 MED ORDER — TOPIRAMATE 25 MG PO TABS: 25.0000 mg | ORAL_TABLET | Freq: Two times a day (BID) | ORAL | 3 refills | Status: DC

## 2018-02-05 ENCOUNTER — Ambulatory Visit: Payer: 59 | Admitting: Psychology

## 2018-02-05 DIAGNOSIS — F331 Major depressive disorder, recurrent, moderate: Secondary | ICD-10-CM

## 2018-02-10 ENCOUNTER — Other Ambulatory Visit: Payer: Self-pay | Admitting: Cardiology

## 2018-02-10 ENCOUNTER — Encounter: Payer: Self-pay | Admitting: Family Medicine

## 2018-02-10 ENCOUNTER — Other Ambulatory Visit: Payer: Self-pay | Admitting: Family Medicine

## 2018-02-10 ENCOUNTER — Ambulatory Visit: Payer: 59 | Admitting: Family Medicine

## 2018-02-10 ENCOUNTER — Telehealth: Payer: Self-pay | Admitting: *Deleted

## 2018-02-10 VITALS — BP 120/82 | HR 88 | Temp 98.5°F | Ht 63.0 in | Wt 234.8 lb

## 2018-02-10 DIAGNOSIS — Z23 Encounter for immunization: Secondary | ICD-10-CM | POA: Diagnosis not present

## 2018-02-10 DIAGNOSIS — G43109 Migraine with aura, not intractable, without status migrainosus: Secondary | ICD-10-CM

## 2018-02-10 DIAGNOSIS — G8929 Other chronic pain: Secondary | ICD-10-CM

## 2018-02-10 DIAGNOSIS — M5441 Lumbago with sciatica, right side: Secondary | ICD-10-CM | POA: Diagnosis not present

## 2018-02-10 DIAGNOSIS — M5412 Radiculopathy, cervical region: Secondary | ICD-10-CM | POA: Diagnosis not present

## 2018-02-10 DIAGNOSIS — E782 Mixed hyperlipidemia: Secondary | ICD-10-CM

## 2018-02-10 DIAGNOSIS — L02412 Cutaneous abscess of left axilla: Secondary | ICD-10-CM

## 2018-02-10 DIAGNOSIS — E039 Hypothyroidism, unspecified: Secondary | ICD-10-CM

## 2018-02-10 LAB — CBC WITH DIFFERENTIAL/PLATELET
Basophils Absolute: 0 10*3/uL (ref 0.0–0.1)
Basophils Relative: 0.7 % (ref 0.0–3.0)
Eosinophils Absolute: 0.2 10*3/uL (ref 0.0–0.7)
Eosinophils Relative: 3.6 % (ref 0.0–5.0)
HCT: 39.5 % (ref 36.0–46.0)
Hemoglobin: 13.4 g/dL (ref 12.0–15.0)
Lymphocytes Relative: 40.6 % (ref 12.0–46.0)
Lymphs Abs: 2.3 10*3/uL (ref 0.7–4.0)
MCHC: 34.1 g/dL (ref 30.0–36.0)
MCV: 92.8 fl (ref 78.0–100.0)
Monocytes Absolute: 0.4 10*3/uL (ref 0.1–1.0)
Monocytes Relative: 6.6 % (ref 3.0–12.0)
Neutro Abs: 2.8 10*3/uL (ref 1.4–7.7)
Neutrophils Relative %: 48.5 % (ref 43.0–77.0)
Platelets: 247 10*3/uL (ref 150.0–400.0)
RBC: 4.25 Mil/uL (ref 3.87–5.11)
RDW: 13.1 % (ref 11.5–15.5)
WBC: 5.7 10*3/uL (ref 4.0–10.5)

## 2018-02-10 LAB — COMPREHENSIVE METABOLIC PANEL
ALT: 24 U/L (ref 0–35)
AST: 23 U/L (ref 0–37)
Albumin: 4 g/dL (ref 3.5–5.2)
Alkaline Phosphatase: 106 U/L (ref 39–117)
BUN: 18 mg/dL (ref 6–23)
CO2: 26 mEq/L (ref 19–32)
Calcium: 9.4 mg/dL (ref 8.4–10.5)
Chloride: 107 mEq/L (ref 96–112)
Creatinine, Ser: 0.96 mg/dL (ref 0.40–1.20)
GFR: 63.45 mL/min (ref >60.00)
Glucose, Bld: 95 mg/dL (ref 70–99)
Potassium: 4 mEq/L (ref 3.5–5.1)
Sodium: 140 mEq/L (ref 135–145)
Total Bilirubin: 0.5 mg/dL (ref 0.2–1.2)
Total Protein: 6.5 g/dL (ref 6.0–8.3)

## 2018-02-10 LAB — TSH: TSH: 6.28 u[IU]/mL — ABNORMAL HIGH (ref 0.35–4.50)

## 2018-02-10 MED ORDER — DIAZEPAM 5 MG PO TABS: ORAL_TABLET | ORAL | 1 refills | Status: DC

## 2018-02-10 MED ORDER — CEPHALEXIN 500 MG PO CAPS: 500.0000 mg | ORAL_CAPSULE | Freq: Four times a day (QID) | ORAL | 0 refills | Status: AC

## 2018-02-10 MED ORDER — OXYCODONE-ACETAMINOPHEN 5-325 MG PO TABS: 1.0000 | ORAL_TABLET | Freq: Three times a day (TID) | ORAL | 0 refills | Status: DC | PRN

## 2018-02-10 NOTE — Progress Notes (Signed)
Kristine Curtis is a 57 y.o. female is here for follow up.  History of Present Illness:   Kristine Curtis, CMA acting as scribe for [name].   HPI: Patient in office for follow up and medication refill. She also has boil  under left arm that came up about three weeks ago. She has not noticed any discharge of any kind. It will sell at times and gets very tender. She does have redness in area at times.   Finished PT. Has done very well learning "how to move." Uses yoga and stretching for pain control. Rarely needs Percocet. Uses Valium sparingly for spasms.  Still working with therapist monthly. Helpful to focus on self-care. Has caused some trouble in marriage but she is "at peace with that."  Loves Dr. [name]. New medication very helpful for migraines. Went two weeks without one. States that they are now complicated and mimic stroke symptoms.   There are no preventive care reminders to display for this patient.   Depression screen [facility] 2/9 02/10/2018 11/10/2017 08/11/2017  Decreased Interest 0 0 1  Down, Depressed, Hopeless 0 0 1  PHQ - 2 Score 0 0 2  Altered sleeping 2 1 0  Tired, decreased energy 2 2 1   Change in appetite 0 1 0  Feeling bad or failure about yourself  0 0 0  Trouble concentrating 3 2 3   Moving slowly or fidgety/restless 0 - 0  Suicidal thoughts 0 0 -  PHQ-9 Score 7 6 6   Difficult doing work/chores Not difficult at all Somewhat difficult Somewhat difficult  Some recent data might be hidden   PMHx, SurgHx, SocialHx, FamHx, Medications, and Allergies were reviewed in the Visit Navigator and updated as appropriate.   Patient Active Problem List   Diagnosis Date Noted  . Carpal tunnel syndrome of right wrist 12/22/2017  . Tinea pedis 11/28/2017  . IBS (irritable bowel syndrome) 11/10/2017  . B12 deficiency 11/10/2017  . OSA (obstructive sleep apnea) 11/10/2017  . Myalgia due to statin 11/10/2017  . Lumbar disc disease 10/01/2017  . RLS (restless legs  syndrome) 10/01/2017  . Myofascial pain dysfunction syndrome 07/14/2017  . Blind right eye 07/14/2017  . Cervical radiculopathy 07/14/2017  . Degenerative arthritis of left knee, XRAY12/2018 07/13/2017  . Dizziness 03/01/2017  . Morbid obesity ([facility]) 02/24/2017  . Sjogren's disease ([facility]) 02/24/2017  . Dense breasts 02/03/2017  . Chronic fatigue syndrome with fibromyalgia 01/21/2017  . Failed back syndrome of lumbar spine 12/30/2016  . Chronic low back pain with sciatica 05/02/2015  . Chronic systolic congestive heart failure ([facility]), EF 35-40% 03/22/2015  . Sicca ([facility]) 11/22/2014  . Family history of polycystic kidney, with negative CT 11/16/2012  . Vitamin D deficiency, on daily vitamin D 10/09/2009  . Hyperlipidemia, with high triglycerides and low HDL, not on statin 11/15/2008  . Depression with anxiety 08/31/2008  . Essential hypertension, on Losartan, Coreg, and Spironolactone 08/31/2008  . Allergic rhinitis, on Flonase 08/31/2008  . Asthma, Symbicort 08/31/2008  . GERD, on Ompeprazole 08/31/2008  . Osteoarthritis 08/31/2008  . Hypothyroidism, on Levothyroxine 08/30/2008  . Classical migraine without intractable migraine 08/30/2008  . Abnormal Pap smear of cervix 05/07/1995   Social History   Tobacco Use  . Smoking status: Former Smoker    Packs/day: 2.00    Years: 12.00    Pack years: 24.00    Types: Cigarettes    Last attempt to quit: 05/06/1989    Years since quitting: 28.7  . Smokeless  tobacco: Never Used  . Tobacco comment: quit smoking 1990  Substance Use Topics  . Alcohol use: No    Alcohol/week: 0.0 standard drinks  . Drug use: No   Current Medications and Allergies:   .  ASMANEX 60 METERED DOSES 220 MCG/INH inhaler, INHALE 2 PUFFS INTO LUNGS EVERY DAY, Disp: 1 Inhaler, Rfl: 6 .  buPROPion (WELLBUTRIN XL) 300 MG 24 hr tablet, TAKE 1 TABLET BY MOUTH DAILY, Disp: 30 tablet, Rfl: 11 .  carvedilol (COREG) 25 MG tablet, Take 1/2 tablet ( 12.5 mg ) twice a day, Disp:  60 tablet, Rfl: 6 .  Cholecalciferol (VITAMIN D-3) 5000 UNITS TABS, Take 5,000 Units by mouth daily. , Disp: , Rfl:  .  cyclobenzaprine (FLEXERIL) 5 MG tablet, Take 1 tablet by mouth daily., Disp: , Rfl:  .  diazepam (VALIUM) 5 MG tablet, TAKE ONE TABLET EVERY SIX HOURS IF NEEDED, Disp: 30 tablet, Rfl: 1 .  fluticasone (FLONASE) 50 MCG/ACT nasal spray, USE ONE SPRAY IN EACH NOSTRIL ONCE A DAY, Disp: 16 g, Rfl: 3 .  furosemide (LASIX) 20 MG tablet, TAKE 1 TABLET BY MOUTH DAILY, Disp: 90 tablet, Rfl: 2 .  Galcanezumab-gnlm (EMGALITY) 120 MG/ML SOAJ, Inject 120 mg into the skin every 30 (thirty) days., Disp: , Rfl:  .  Halobetasol Propionate (BRYHALI) 0.01 % LOTN, Apply topically as needed., Disp: , Rfl:  .  hydroxychloroquine (PLAQUENIL) 200 MG tablet, Take 400 mg by mouth every morning. , Disp: , Rfl:  .  hydrOXYzine (ATARAX/VISTARIL) 25 MG tablet, Take 1 tablet (25 mg total) by mouth at bedtime as needed for itching (insomnia)., Disp: 60 tablet, Rfl: 0 .  levothyroxine (SYNTHROID, LEVOTHROID) 88 MCG tablet, TAKE ONE TABLET BY MOUTH EVERY DAY, Disp: 30 tablet, Rfl: 2 .  losartan (COZAAR) 50 MG tablet, Take 50 mg by mouth daily. 1/2 tab bid, Disp: , Rfl:  .  meloxicam (MOBIC) 7.5 MG tablet, Take 1 tablet (7.5 mg total) by mouth daily., Disp: 30 tablet, Rfl: 2 .  ONE TOUCH ULTRA TEST test strip, USE AS DIRECTED TO CHECK BLOOD SUGAR ONCE A DAY, Disp: 100 each, Rfl: 6 .  OVER THE COUNTER MEDICATION, Take 5 capsules by mouth daily. Ariix Optimals Vitamin & Minerals , Disp: , Rfl:  .  OVER THE COUNTER MEDICATION, Take 1 scoop by mouth daily. Ariix Magnecal D , Disp: , Rfl:  .  oxyCODONE-acetaminophen (PERCOCET/ROXICET) 5-325 MG tablet, Take 1 tablet by mouth every 8 (eight) hours as needed for severe pain., Disp: 60 tablet, Rfl: 0 .  spironolactone (ALDACTONE) 25 MG tablet, Take 1 tablet (25 mg total) by mouth daily., Disp: 90 tablet, Rfl: 1 .  topiramate (TOPAMAX) 25 MG tablet, Take 1 tablet (25 mg  total) by mouth 2 (two) times daily., Disp: 60 tablet, Rfl: 3 .  triamcinolone (KENALOG) 0.025 % ointment, Apply 1 application topically 2 (two) times daily., Disp: 454 g, Rfl: 0 .  Wheat Dextrin (BENEFIBER) POWD, Take 2 scoop by mouth daily as needed (for constipation). , Disp: , Rfl:  .  cephALEXin (KEFLEX) 500 MG capsule, Take 1 capsule (500 mg total) by mouth 4 (four) times daily for 7 days., Disp: 28 capsule, Rfl: 0 .  omeprazole (PRILOSEC) 40 MG capsule, TAKE 1 CAPSULE EVERY DAY BEFORE BREAKFAST, Disp: 30 capsule, Rfl: 1   Allergies  Allergen Reactions  . Gabapentin (Once-Daily)     Depression   . Saxenda [Liraglutide -Weight Management] Other (See Comments)    Depression   .  Sulfa Antibiotics     itching  . Trintellix [Vortioxetine] Other (See Comments)    GI issues   . Adhesive [Tape] Rash  . Codeine Itching   Review of Systems   Pertinent items are noted in the HPI. Otherwise, ROS is negative.  Vitals:   Vitals:   02/10/18 0858  BP: 120/82  Pulse: 88  Temp: 98.5 F (36.9 C)  TempSrc: Oral  SpO2: 98%  Weight: 234 lb 12.8 oz (106.5 kg)  Height: 5\' 3"  (1.6 m)     Body mass index is 41.59 kg/m.  Physical Exam:   Physical Exam  Constitutional: She appears well-nourished.  HENT:  Head: Normocephalic and atraumatic.  Eyes: Pupils are equal, round, and reactive to light. EOM are normal.  Neck: Normal range of motion. Neck supple.  Cardiovascular: Normal rate, regular rhythm, normal heart sounds and intact distal pulses.  Pulmonary/Chest: Effort normal.  Abdominal: Soft.  Skin: Skin is warm.  Left axilla with 2 cm area of erythema and induration. Minld fluctuance, no streaking.   Psychiatric: She has a normal mood and affect. Her behavior is normal.  Nursing note and vitals reviewed.  Assessment and Plan:   Akshitha was seen today for follow-up.  Diagnoses and all orders for this visit:  Acquired hypothyroidism -     TSH  Chronic right-sided low back  pain with right-sided sciatica Comments: Reviewed pain control. Rx only needed every 4-5 months. Orders: -     diazepam (VALIUM) 5 MG tablet; TAKE ONE TABLET EVERY SIX HOURS IF NEEDED -     oxyCODONE-acetaminophen (PERCOCET/ROXICET) 5-325 MG tablet; Take 1 tablet by mouth every 8 (eight) hours as needed for severe pain.  Chronic radicular cervical pain Comments: Reviewed pain control as above. Will request records from surgeon.  Orders: -     diazepam (VALIUM) 5 MG tablet; TAKE ONE TABLET EVERY SIX HOURS IF NEEDED -     oxyCODONE-acetaminophen (PERCOCET/ROXICET) 5-325 MG tablet; Take 1 tablet by mouth every 8 (eight) hours as needed for severe pain.  Need for immunization against influenza -     Flu Vaccine QUAD 36+ mos IM  Mixed hyperlipidemia -     CBC with Differential/Platelet -     Comprehensive metabolic panel  Classical migraine without intractable migraine  Abscess of left axilla -     cephALEXin (KEFLEX) 500 MG capsule; Take 1 capsule (500 mg total) by mouth 4 (four) times daily for 7 days.   . Reviewed expectations re: course of current medical issues. . Discussed self-management of symptoms. . Outlined signs and symptoms indicating need for more acute intervention. . Patient verbalized understanding and all questions were answered. Marland Kitchen Health Maintenance issues including appropriate healthy diet, exercise, and smoking avoidance were discussed with patient. . See orders for this visit as documented in the electronic medical record. . Patient received an After Visit Summary.  CMA served as Neurosurgeon during this visit. History, Physical, and Plan performed by medical provider. The above documentation has been reviewed and is accurate and complete. Helane Rima, D.O.  Helane Rima, DO [place], [place] [facility] 02/10/2018

## 2018-02-10 NOTE — Telephone Encounter (Signed)
Please advise 

## 2018-02-10 NOTE — Telephone Encounter (Signed)
Copied from CRM [id number]. Topic: General - Other >> Feb 10, 2018 11:07 AM Gerrianne Scale wrote: Reason for CRM: pt calling stating that she was suppose to get an antibiotic keflex sent to her pharmacy and it wasn't there

## 2018-02-10 NOTE — Telephone Encounter (Signed)
Sent earlier 

## 2018-03-12 ENCOUNTER — Ambulatory Visit: Payer: 59 | Admitting: Psychology

## 2018-03-13 ENCOUNTER — Other Ambulatory Visit: Payer: Self-pay | Admitting: Family Medicine

## 2018-03-13 ENCOUNTER — Other Ambulatory Visit: Payer: Self-pay | Admitting: Cardiology

## 2018-03-16 NOTE — Telephone Encounter (Signed)
Rx has been sent to the pharmacy electronically. ° °

## 2018-03-18 ENCOUNTER — Other Ambulatory Visit: Payer: Self-pay | Admitting: Family Medicine

## 2018-03-18 DIAGNOSIS — M5412 Radiculopathy, cervical region: Secondary | ICD-10-CM

## 2018-03-18 DIAGNOSIS — G8929 Other chronic pain: Secondary | ICD-10-CM

## 2018-03-18 DIAGNOSIS — M5441 Lumbago with sciatica, right side: Principal | ICD-10-CM

## 2018-03-19 NOTE — Telephone Encounter (Signed)
I'm okay with refill. Please see pharmacy comments, though. Help me make sure patient getting full script.

## 2018-03-19 NOTE — Telephone Encounter (Signed)
Last app 02/10/18 Last refill 02/10/18 #60 no r/f  F/u 05/13/18

## 2018-03-25 NOTE — Telephone Encounter (Signed)
Rx updated and ready for you to send.

## 2018-03-25 NOTE — Telephone Encounter (Signed)
Please see my message and update. Thanks!

## 2018-04-10 ENCOUNTER — Other Ambulatory Visit: Payer: Self-pay | Admitting: Family Medicine

## 2018-04-10 DIAGNOSIS — H6983 Other specified disorders of Eustachian tube, bilateral: Secondary | ICD-10-CM

## 2018-04-22 ENCOUNTER — Telehealth: Payer: Self-pay | Admitting: Cardiology

## 2018-04-22 NOTE — Telephone Encounter (Signed)
No note needed/ error 

## 2018-04-29 IMAGING — DX DG CHEST 2V
2 series · 2 of 2 positions shown · non-contrast
Comparison: 01/14/2017.

CLINICAL DATA: Productive cough for 4 weeks with wheezing and
shortness of breath.

EXAM:
CHEST  2 VIEW

[chest pa]
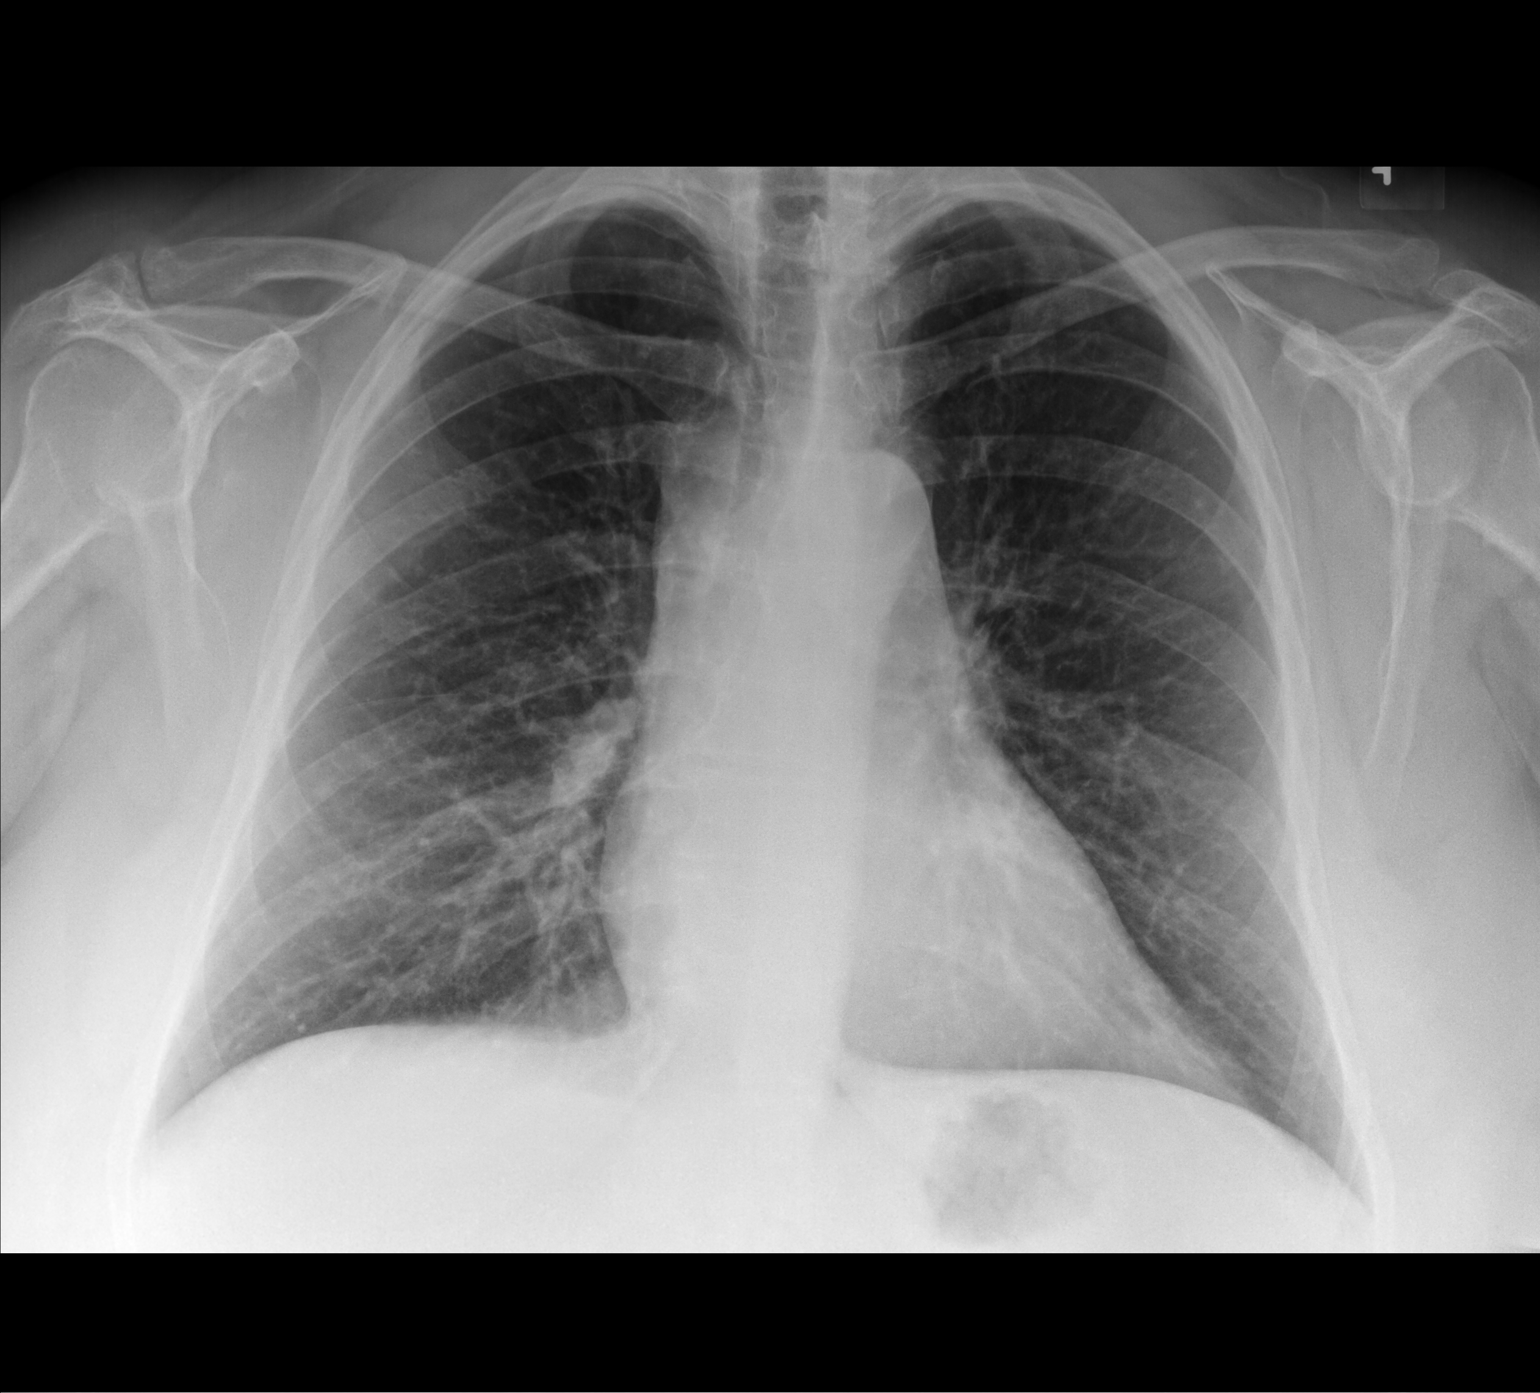

[chest lat]
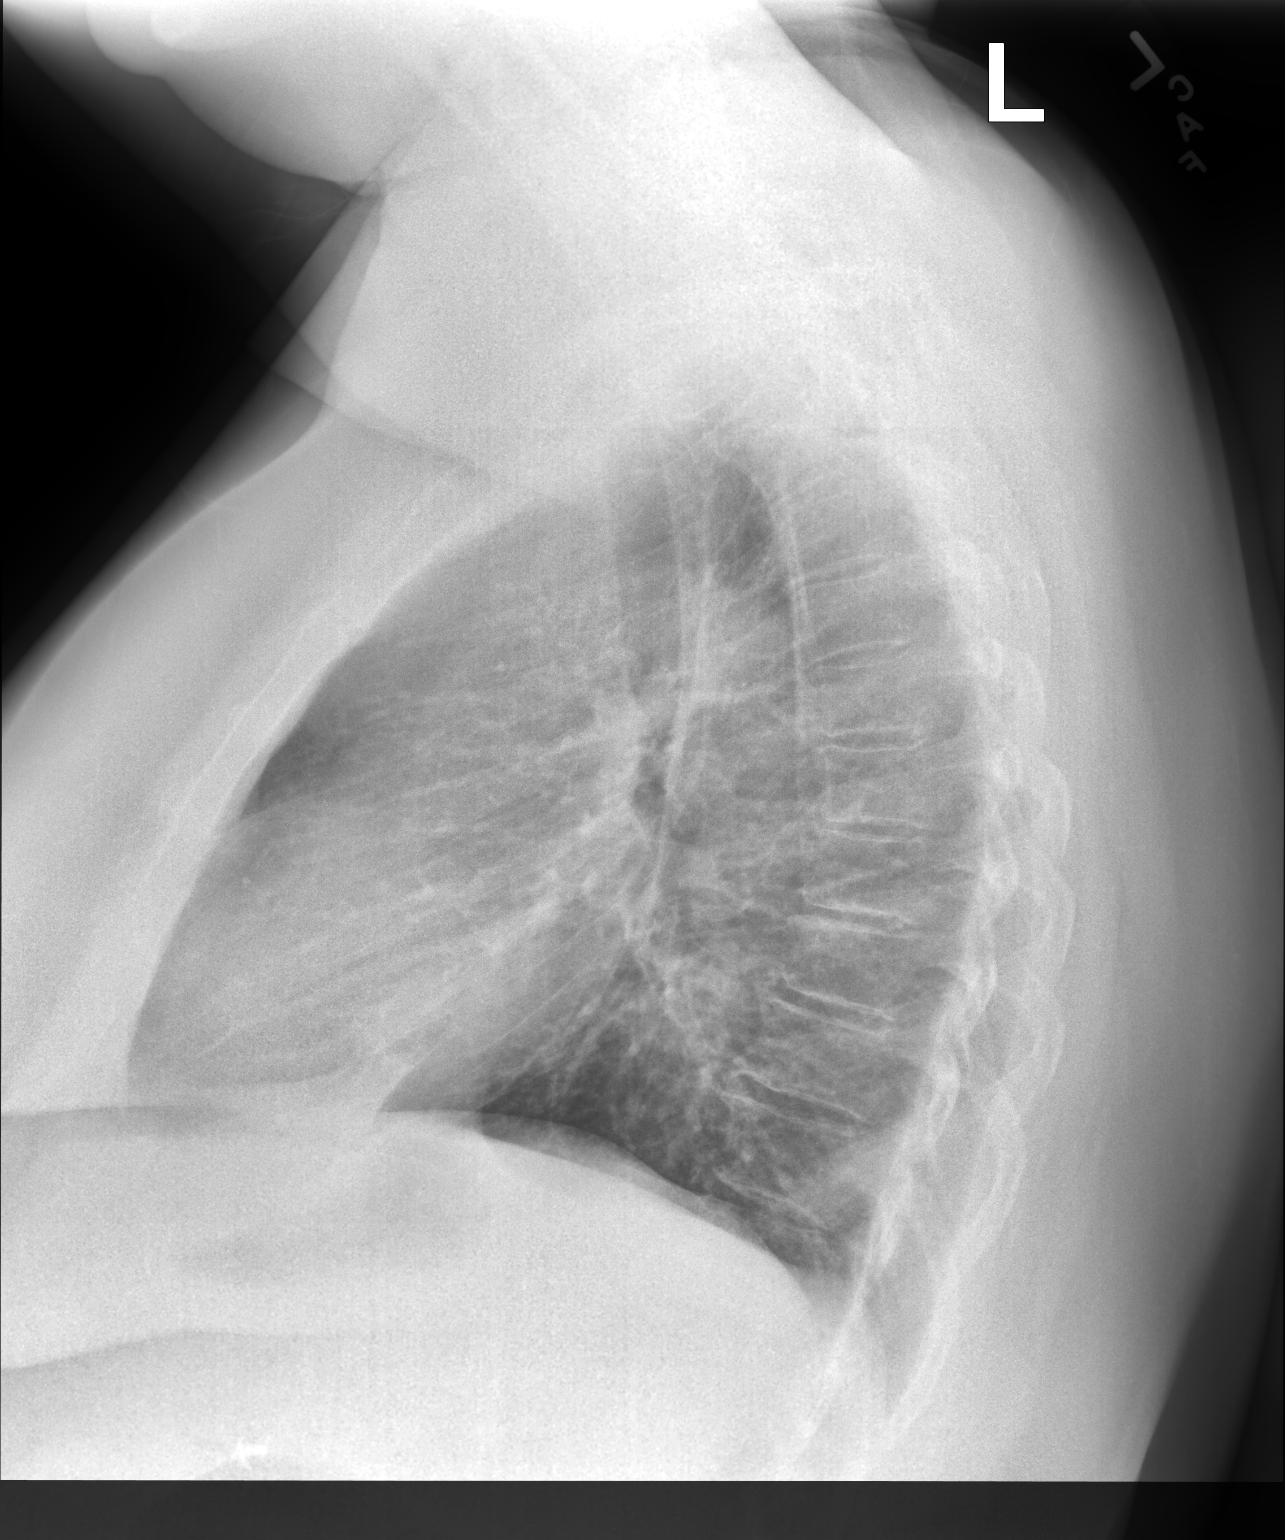

[2 of 2 positions shown; findings below may reference images not displayed]

FINDINGS: Trachea is midline. Heart size normal. Lungs are clear. No pleural
fluid.
IMPRESSION: No acute findings.

## 2018-04-30 ENCOUNTER — Other Ambulatory Visit: Payer: Self-pay

## 2018-04-30 MED ORDER — BUPROPION HCL ER (XL) 300 MG PO TB24: 300.0000 mg | ORAL_TABLET | Freq: Every day | ORAL | 11 refills | Status: DC

## 2018-05-13 ENCOUNTER — Encounter: Payer: Self-pay | Admitting: Physician Assistant

## 2018-05-13 ENCOUNTER — Ambulatory Visit: Payer: 59 | Admitting: Family Medicine

## 2018-05-13 ENCOUNTER — Ambulatory Visit: Payer: 59 | Admitting: Physician Assistant

## 2018-05-13 ENCOUNTER — Encounter: Payer: Self-pay | Admitting: Family Medicine

## 2018-05-13 VITALS — BP 122/90 | HR 89 | Ht 63.0 in | Wt 241.0 lb

## 2018-05-13 VITALS — BP 100/70 | HR 91 | Temp 98.4°F | Ht 63.0 in | Wt 239.8 lb

## 2018-05-13 DIAGNOSIS — I5022 Chronic systolic (congestive) heart failure: Secondary | ICD-10-CM

## 2018-05-13 DIAGNOSIS — M3500 Sicca syndrome, unspecified: Secondary | ICD-10-CM

## 2018-05-13 DIAGNOSIS — I428 Other cardiomyopathies: Secondary | ICD-10-CM

## 2018-05-13 DIAGNOSIS — M7918 Myalgia, other site: Secondary | ICD-10-CM | POA: Diagnosis not present

## 2018-05-13 DIAGNOSIS — L304 Erythema intertrigo: Secondary | ICD-10-CM

## 2018-05-13 DIAGNOSIS — E039 Hypothyroidism, unspecified: Secondary | ICD-10-CM

## 2018-05-13 DIAGNOSIS — I493 Ventricular premature depolarization: Secondary | ICD-10-CM | POA: Diagnosis not present

## 2018-05-13 DIAGNOSIS — I10 Essential (primary) hypertension: Secondary | ICD-10-CM | POA: Diagnosis not present

## 2018-05-13 DIAGNOSIS — F325 Major depressive disorder, single episode, in full remission: Secondary | ICD-10-CM

## 2018-05-13 DIAGNOSIS — G43109 Migraine with aura, not intractable, without status migrainosus: Secondary | ICD-10-CM

## 2018-05-13 DIAGNOSIS — Z5181 Encounter for therapeutic drug level monitoring: Secondary | ICD-10-CM

## 2018-05-13 LAB — T4, FREE: Free T4: 0.88 ng/dL (ref 0.60–1.60)

## 2018-05-13 LAB — TSH: TSH: 7.43 u[IU]/mL — ABNORMAL HIGH (ref 0.35–4.50)

## 2018-05-13 MED ORDER — LOSARTAN POTASSIUM 25 MG PO TABS: 25.0000 mg | ORAL_TABLET | Freq: Two times a day (BID) | ORAL | 3 refills | Status: DC

## 2018-05-13 MED ORDER — NYSTATIN-TRIAMCINOLONE 100000-0.1 UNIT/GM-% EX OINT: 1.0000 "application " | TOPICAL_OINTMENT | Freq: Two times a day (BID) | CUTANEOUS | 5 refills | Status: DC

## 2018-05-13 MED ORDER — SPIRONOLACTONE 25 MG PO TABS: 25.0000 mg | ORAL_TABLET | Freq: Every day | ORAL | 3 refills | Status: DC

## 2018-05-13 MED ORDER — OXYCODONE-ACETAMINOPHEN 10-325 MG PO TABS: 1.0000 | ORAL_TABLET | Freq: Four times a day (QID) | ORAL | 0 refills | Status: AC | PRN

## 2018-05-13 MED ORDER — CARVEDILOL 12.5 MG PO TABS: 12.5000 mg | ORAL_TABLET | Freq: Two times a day (BID) | ORAL | 3 refills | Status: DC

## 2018-05-13 MED ORDER — TOPIRAMATE 25 MG PO TABS: 25.0000 mg | ORAL_TABLET | Freq: Two times a day (BID) | ORAL | 3 refills | Status: DC

## 2018-05-13 MED ORDER — FUROSEMIDE 20 MG PO TABS: 20.0000 mg | ORAL_TABLET | Freq: Every day | ORAL | 3 refills | Status: DC

## 2018-05-13 NOTE — Progress Notes (Signed)
Cardiology Office Note    Date:  05/15/2018   ID:  Kristine Curtis, DOB [birth year], MRN [id number]  PCP:  Helane RimaWallace, Erica, DO  Cardiologist:  Dr. SwazilandJordan   Chief Complaint  Patient presents with  . Follow-up    seen for Dr. SwazilandJordan.     History of Present Illness:  Kristine Spanamela R Fortunato is a 58 y.o. female with PMH of NICM, PVCs, HTN, fibromyalgia, morbid obesity, hypothyroidism, RLS and vertigo.She had a cardiac catheterization in 1999 that showed normal coronaries. EF previously as low as 35%,EFimproved to 40-45% by2017. Cardiac MRI in 2017 showed EF 44% with global hypokinesis, no scar or infiltration. She was admitted in February 2018 with 24 hour history of chest pain, dyspnea and headache. Cardiac enzyme were negative. Limited echocardiogram showed EF 40-45% without pericardial effusion. Stress test showed fixed anteroseptal and inferoseptal defect without ischemia. EF was measured 35%. Despite Myoview showing no ischemia, she continued to have exertional chest discomfort and subsequently underwent a left and right heart cath 07/11/2016 which showed normal coronaries. She also had normal cardiac output and EF 35-40%. She had a sleep study that showed in the past no obstructive sleep apnea.She does have restless leg syndrome  Patient was admitted in September 2018 with a episode of syncope.  Outpatient 30-day event monitor was negative for significant arrhythmia.  I last saw the patient on 03/11/2017 and recommended 5352-month follow-up, she presents today for a very delayed follow-up.  She has some shortness of breath with more strenuous activity but not with every day activity.  Otherwise she denies any recent chest pain, lower extremity edema, orthopnea or PND.  She is doing well from cardiology perspective.  She still occasionally has some dizziness, this is unchanged compared to last year.    Past Medical History:  Diagnosis Date  . Allergic rhinitis due to pollen   . Asthma   .  Chronic combined systolic and diastolic CHF (congestive heart failure) (HCC)    06/2016 Echo: EF 40-45%, Gr1 DD  . Depression   . Family history of polycystic kidney with negative CT 11/16/2012  . Fibromyalgia   . GERD (gastroesophageal reflux disease)   . Hypertension   . Hypothyroidism   . IBS (irritable bowel syndrome)   . Internal hemorrhoids   . Morbid obesity (HCC)   . NICM (nonischemic cardiomyopathy) (HCC)    a. 1999 Cath: nl cors;  b. EF prev as low as 35%;  c. 11/2015 Echo: Ef 40-45%;  d. 06/2016 Echo: EF 40-45%;  e. Lexiscan MV: fixed anterosepta/inferseptal defect w/o ischemia, EF 35%.  . Osteoarthritis   . PVC (premature ventricular contraction)   . Restless leg syndrome   . Sicca (HCC) 11/22/2014  . Vertigo     Past Surgical History:  Procedure Laterality Date  . ABDOMINAL HYSTERECTOMY    . CESAREAN SECTION    . CHOLECYSTECTOMY  10-2008  . COLONOSCOPY    . ESOPHAGOGASTRODUODENOSCOPY    . EYE SURGERY     2 2013 and 1981/DCR of right eye 05/2014  . LUMBAR DISC SURGERY  2016   L3-4  . RIGHT/LEFT HEART CATH AND CORONARY ANGIOGRAPHY N/A 07/11/2016   Procedure: Right/Left Heart Cath and Coronary Angiography;  Surgeon: Peter M SwazilandJordan, MD;  [facility];  Service: Cardiovascular;  Laterality: N/A;  . SHOULDER ARTHROSCOPY W/ SUBACROMIAL DECOMPRESSION AND DISTAL CLAVICLE EXCISION Right   . SPINE SURGERY     C6-C7, 03/2017  . TUBAL LIGATION  1992  Current Medications: Outpatient Medications Prior to Visit  Medication Sig Dispense Refill  . ASMANEX 60 METERED DOSES 220 MCG/INH inhaler INHALE 2 PUFFS INTO LUNGS EVERY DAY 1 Inhaler 6  . buPROPion (WELLBUTRIN XL) 300 MG 24 hr tablet Take 1 tablet (300 mg total) by mouth daily. 30 tablet 11  . Cholecalciferol (VITAMIN D-3) 25 MCG (1000 UT) CAPS Take 1,000 Units by mouth daily.     . cyclobenzaprine (FLEXERIL) 5 MG tablet Take 1 tablet by mouth daily.    . diazepam (VALIUM) 5 MG tablet TAKE ONE TABLET EVERY SIX  HOURS IF NEEDED 30 tablet 1  . fluticasone (FLONASE) 50 MCG/ACT nasal spray USE 1 SPRAY IN EACH NOSTRIL EVERY DAY 16 g 3  . Galcanezumab-gnlm (EMGALITY) 120 MG/ML SOAJ Inject 120 mg into the skin every 30 (thirty) days.    Marland Kitchen Halobetasol Propionate (BRYHALI) 0.01 % LOTN Apply topically as needed.    . hydroxychloroquine (PLAQUENIL) 200 MG tablet Take 400 mg by mouth every morning.     . hydrOXYzine (ATARAX/VISTARIL) 25 MG tablet Take 1 tablet (25 mg total) by mouth at bedtime as needed for itching (insomnia). 60 tablet 0  . meloxicam (MOBIC) 7.5 MG tablet TAKE 1 TABLET BY MOUTH DAILY 30 tablet 2  . nystatin-triamcinolone ointment (MYCOLOG) Apply 1 application topically 2 (two) times daily. 30 g 5  . omeprazole (PRILOSEC) 40 MG capsule TAKE 1 CAPSULE EVERY DAY BEFORE BREAKFAST 30 capsule 1  . ONE TOUCH ULTRA TEST test strip USE AS DIRECTED TO CHECK BLOOD SUGAR ONCE A DAY 100 each 6  . OVER THE COUNTER MEDICATION Take 5 capsules by mouth daily. Ariix Optimals Vitamin & Minerals     . OVER THE COUNTER MEDICATION Take 1 scoop by mouth daily. Ariix Magnecal D     . oxyCODONE-acetaminophen (PERCOCET) 10-325 MG tablet Take 1 tablet by mouth every 6 (six) hours as needed for up to 7 days for pain. 28 tablet 0  . topiramate (TOPAMAX) 25 MG tablet Take 1 tablet (25 mg total) by mouth 2 (two) times daily. 60 tablet 3  . triamcinolone (KENALOG) 0.025 % ointment Apply 1 application topically 2 (two) times daily. 454 g 0  . Wheat Dextrin (BENEFIBER) POWD Take 2 scoop by mouth daily as needed (for constipation).     . carvedilol (COREG) 12.5 MG tablet Take 12.5 mg by mouth 2 (two) times daily with a meal.    . furosemide (LASIX) 20 MG tablet Take 1 tablet (20 mg total) by mouth daily. Needs appointment 15 tablet 0  . levothyroxine (SYNTHROID, LEVOTHROID) 88 MCG tablet TAKE ONE TABLET BY MOUTH EVERY DAY 30 tablet 2  . losartan (COZAAR) 25 MG tablet Take 25 mg by mouth 2 (two) times daily.    Marland Kitchen spironolactone  (ALDACTONE) 25 MG tablet Take 1 tablet (25 mg total) by mouth daily. 90 tablet 1  . carvedilol (COREG) 25 MG tablet Take 1/2 tablet ( 12.5 mg ) twice a day (Patient not taking: Reported on 05/13/2018) 60 tablet 6  . losartan (COZAAR) 50 MG tablet Take 50 mg by mouth daily. 1/2 tab bid     No facility-administered medications prior to visit.      Allergies:   Gabapentin (once-daily); Saxenda [liraglutide -weight management]; Sulfa antibiotics; Trintellix [vortioxetine]; Adhesive [tape]; and Codeine   Social History   Socioeconomic History  . Marital status: Married    Spouse name: Not on file  . Number of children: 2  . Years of education: Not  on file  . Highest education level: Not on file  Occupational History  . Occupation: Nutritional therapistquality assurance tech  Social Needs  . Financial resource strain: Not on file  . Food insecurity:    Worry: Not on file    Inability: Not on file  . Transportation needs:    Medical: Not on file    Non-medical: Not on file  Tobacco Use  . Smoking status: Former Smoker    Packs/day: 2.00    Years: 12.00    Pack years: 24.00    Types: Cigarettes    Last attempt to quit: 05/06/1989    Years since quitting: 29.0  . Smokeless tobacco: Never Used  . Tobacco comment: quit smoking 1990  Substance and Sexual Activity  . Alcohol use: No    Alcohol/week: 0.0 standard drinks  . Drug use: No  . Sexual activity: Not on file  Lifestyle  . Physical activity:    Days per week: Not on file    Minutes per session: Not on file  . Stress: Not on file  Relationships  . Social connections:    Talks on phone: Not on file    Gets together: Not on file    Attends religious service: Not on file    Active member of club or organization: Not on file    Attends meetings of clubs or organizations: Not on file    Relationship status: Not on file  Other Topics Concern  . Not on file  Social History Narrative   Former Dr. Andrey CampanileWilson then Dr. Artis FlockKindl patient    Dr. Larey DresserMartha  Ajlouny, podiatrist     Family History:  The patient's family history includes Diabetes in her father; Glaucoma in her father; Heart disease in her father; Kidney failure in her brother; Pancreatic cancer in her mother.   ROS:   Please see the history of present illness.    ROS All other systems reviewed and are negative.   PHYSICAL EXAM:   VS:  BP 122/90   Pulse 89   Ht 5\' 3"  (1.6 m)   Wt 241 lb (109.3 kg)   BMI 42.69 kg/m    GEN: Well nourished, well developed, in no acute distress  HEENT: normal  Neck: no JVD, carotid bruits, or masses Cardiac: RRR; no murmurs, rubs, or gallops,no edema  Respiratory:  clear to auscultation bilaterally, normal work of breathing GI: soft, nontender, nondistended, + BS MS: no deformity or atrophy  Skin: warm and dry, no rash Neuro:  Alert and Oriented x 3, Strength and sensation are intact Psych: euthymic mood, full affect  Wt Readings from Last 3 Encounters:  05/13/18 241 lb (109.3 kg)  05/13/18 239 lb 12.8 oz (108.8 kg)  02/10/18 234 lb 12.8 oz (106.5 kg)      Studies/Labs Reviewed:   EKG:  EKG is ordered today.  The ekg ordered today demonstrates normal sinus rhythm, no significant ST-T wave changes  Recent Labs: 02/10/2018: ALT 24; BUN 18; Creatinine, Ser 0.96; Hemoglobin 13.4; Platelets 247.0; Potassium 4.0; Sodium 140 05/13/2018: TSH 7.43   Lipid Panel    Component Value Date/Time   CHOL 140 07/14/2017 0835   TRIG 181.0 (H) 07/14/2017 0835   HDL 43.00 07/14/2017 0835   CHOLHDL 3 07/14/2017 0835   VLDL 36.2 07/14/2017 0835   LDLCALC 61 07/14/2017 0835   LDLDIRECT 75.0 03/20/2016 0836    Additional studies/ records that were reviewed today include:   Echo 06/28/2016 LV EF: 40% -   45%  Study Conclusions  - Left ventricle: The cavity size was mildly dilated. Wall   thickness was increased in a pattern of mild LVH. Systolic   function was mildly to moderately reduced. The estimated ejection   fraction was in the range of  40% to 45%. Diffuse hypokinesis.   Doppler parameters are consistent with abnormal left ventricular   relaxation (grade 1 diastolic dysfunction). - Left atrium: The atrium was mildly dilated.  Impressions:  - Limited study to assess LV function; full doppler study not   performed; mild to moderate global reduction in LV systolic   function; grade 1 diastolic dysfunction; mild LVH; mild LVE; mild   LAE.   Cath 07/11/2016 Conclusion     There is moderate left ventricular systolic dysfunction.  The left ventricular ejection fraction is 35-45% by visual estimate.  LV end diastolic pressure is mildly elevated.  1. Normal coronary anatomy 2. Moderate LV dysfunction- global. EF 35-40%.  3. Mildly elevated LVEDP 4. Normal pulmonary pressures. RA pressure is elevated. 5. Normal cardiac output.  Plan: continue medical therapy. Consider addition of aldactone to current therapy      ASSESSMENT:    1. PVC's (premature ventricular contractions)   2. NICM (nonischemic cardiomyopathy) (HCC)   3. Essential hypertension   4. Hypothyroidism, unspecified type      PLAN:  In order of problems listed above:  1. Nonischemic cardiomyopathy: EF 35 to 40% on previous cardiac catheterization.  Continue carvedilol, losartan and spironolactone.  2. Hypertension: Blood pressure well controlled  3. Hypothyroidism: Managed by primary care provider.  4. PVCs: Patient still has some palpitation, this is well controlled on carvedilol.    Medication Adjustments/Labs and Tests Ordered: Current medicines are reviewed at length with the patient today.  Concerns regarding medicines are outlined above.  Medication changes, Labs and Tests ordered today are listed in the Patient Instructions below. Patient Instructions  Medication Instructions:  Your physician recommends that you continue on your current medications as directed. Please refer to the Current Medication list given to you  today. If you need a refill on your cardiac medications before your next appointment, please call your pharmacy.   Lab work: None  If you have labs (blood work) drawn today and your tests are completely normal, you will receive your results only by: Marland Kitchen MyChart Message (if you have MyChart) OR . A paper copy in the mail If you have any lab test that is abnormal or we need to change your treatment, we will call you to review the results.  Testing/Procedures: None   Follow-Up: At [facility], you and your health needs are our priority.  As part of our continuing mission to provide you with exceptional heart care, we have created designated Provider Care Teams.  These Care Teams include your primary Cardiologist (physician) and Advanced Practice Providers (APPs -  Physician Assistants and Nurse Practitioners) who all work together to provide you with the care you need, when you need it. You will need a follow up appointment in 9-10 months.  Please call our office 2 months in advance to schedule this appointment.  You may see Dr Peter Swaziland or one of the following Advanced Practice Providers on your designated Care Team: [place], [place] . Micah Flesher, PA-C  Any Other Special Instructions Will Be Listed Below (If Applicable).     Ramond Dial, [place]  05/15/2018 11:35 PM    [facility] [address] [place], [place], [place]  [address]  Phone: [phone]; Fax: [phone]

## 2018-05-13 NOTE — Patient Instructions (Signed)
Medication Instructions:  Your physician recommends that you continue on your current medications as directed. Please refer to the Current Medication list given to you today. If you need a refill on your cardiac medications before your next appointment, please call your pharmacy.   Lab work: None  If you have labs (blood work) drawn today and your tests are completely normal, you will receive your results only by: Marland Kitchen MyChart Message (if you have MyChart) OR . A paper copy in the mail If you have any lab test that is abnormal or we need to change your treatment, we will call you to review the results.  Testing/Procedures: None   Follow-Up: At [facility], you and your health needs are our priority.  As part of our continuing mission to provide you with exceptional heart care, we have created designated Provider Care Teams.  These Care Teams include your primary Cardiologist (physician) and Advanced Practice Providers (APPs -  Physician Assistants and Nurse Practitioners) who all work together to provide you with the care you need, when you need it. You will need a follow up appointment in 9-10 months.  Please call our office 2 months in advance to schedule this appointment.  You may see Dr Peter Swaziland or one of the following Advanced Practice Providers on your designated Care Team: [place], [place] . Micah Flesher, PA-C  Any Other Special Instructions Will Be Listed Below (If Applicable).

## 2018-05-13 NOTE — Progress Notes (Signed)
Kristine Curtis is a 58 y.o. female is here for follow up.  History of Present Illness:   HPI:    Current symptoms: Patient denies change in energy level, diarrhea, nervousness and palpitations. Symptoms have been intermittent.  Lab Results  Component Value Date   TSH 6.28 (H) 02/10/2018   TSH 3.036 01/14/2017   TSH 3.97 08/12/2016   Lab Results  Component Value Date   FREET4 0.97 08/12/2016   FREET4 0.86 03/20/2016   FREET4 1.13 11/21/2014     Pain controlled on current regimen.  Still seeing therapist and helping.  Still working with Dr. [name] and headaches improving.   Health Maintenance Due  Topic Date Due  . MAMMOGRAM  03/27/2018   Depression screen [facility] 2/9 02/10/2018 11/10/2017 08/11/2017  Decreased Interest 0 0 1  Down, Depressed, Hopeless 0 0 1  PHQ - 2 Score 0 0 2  Altered sleeping 2 1 0  Tired, decreased energy 2 2 1   Change in appetite 0 1 0  Feeling bad or failure about yourself  0 0 0  Trouble concentrating 3 2 3   Moving slowly or fidgety/restless 0 - 0  Suicidal thoughts 0 0 -  PHQ-9 Score 7 6 6   Difficult doing work/chores Not difficult at all Somewhat difficult Somewhat difficult  Some recent data might be hidden   PMHx, SurgHx, SocialHx, FamHx, Medications, and Allergies were reviewed in the Visit Navigator and updated as appropriate.   Patient Active Problem List   Diagnosis Date Noted  . Carpal tunnel syndrome of right wrist 12/22/2017  . Tinea pedis 11/28/2017  . IBS (irritable bowel syndrome) 11/10/2017  . B12 deficiency 11/10/2017  . OSA (obstructive sleep apnea) 11/10/2017  . Myalgia due to statin 11/10/2017  . Lumbar disc disease 10/01/2017  . RLS (restless legs syndrome) 10/01/2017  . Myofascial pain dysfunction syndrome 07/14/2017  . Blind right eye 07/14/2017  . Cervical radiculopathy 07/14/2017  . Degenerative arthritis of left knee, XRAY12/2018 07/13/2017  . Dizziness 03/01/2017  . Morbid obesity (HCC) 02/24/2017  . Sjogren's  disease (HCC) 02/24/2017  . Dense breasts 02/03/2017  . Chronic fatigue syndrome with fibromyalgia 01/21/2017  . Failed back syndrome of lumbar spine 12/30/2016  . Chronic low back pain with sciatica 05/02/2015  . Chronic systolic congestive heart failure (HCC), EF 35-40% 03/22/2015  . Sicca (HCC) 11/22/2014  . Family history of polycystic kidney, with negative CT 11/16/2012  . Vitamin D deficiency, on daily vitamin D 10/09/2009  . Hyperlipidemia, with high triglycerides and low HDL, not on statin 11/15/2008  . Depression with anxiety 08/31/2008  . Essential hypertension, on Losartan, Coreg, and Spironolactone 08/31/2008  . Allergic rhinitis, on Flonase 08/31/2008  . Asthma, Symbicort 08/31/2008  . GERD, on Ompeprazole 08/31/2008  . Osteoarthritis 08/31/2008  . Hypothyroidism, on Levothyroxine 08/30/2008  . Classical migraine without intractable migraine 08/30/2008  . Abnormal Pap smear of cervix 05/07/1995   Social History   Tobacco Use  . Smoking status: Former Smoker    Packs/day: 2.00    Years: 12.00    Pack years: 24.00    Types: Cigarettes    Last attempt to quit: 05/06/1989    Years since quitting: 29.0  . Smokeless tobacco: Never Used  . Tobacco comment: quit smoking 1990  Substance Use Topics  . Alcohol use: No    Alcohol/week: 0.0 standard drinks  . Drug use: No   Current Medications and Allergies:   .  ASMANEX 60 METERED DOSES 220 MCG/INH inhaler,  INHALE 2 PUFFS INTO LUNGS EVERY DAY, Disp: 1 Inhaler, Rfl: 6 .  buPROPion (WELLBUTRIN XL) 300 MG 24 hr tablet, Take 1 tablet (300 mg total) by mouth daily., Disp: 30 tablet, Rfl: 11 .  carvedilol (COREG) 25 MG tablet, Take 1/2 tablet ( 12.5 mg ) twice a day, Disp: 60 tablet, Rfl: 6 .  Cholecalciferol (VITAMIN D-3) 5000 UNITS TABS, Take 5,000 Units by mouth daily. , Disp: , Rfl:  .  cyclobenzaprine (FLEXERIL) 5 MG tablet, Take 1 tablet by mouth daily., Disp: , Rfl:  .  diazepam (VALIUM) 5 MG tablet, TAKE ONE TABLET EVERY  SIX HOURS IF NEEDED, Disp: 30 tablet, Rfl: 1 .  fluticasone (FLONASE) 50 MCG/ACT nasal spray, USE 1 SPRAY IN EACH NOSTRIL EVERY DAY, Disp: 16 g, Rfl: 3 .  furosemide (LASIX) 20 MG tablet, Take 1 tablet (20 mg total) by mouth daily. Needs appointment, Disp: 15 tablet, Rfl: 0 .  Galcanezumab-gnlm (EMGALITY) 120 MG/ML SOAJ, Inject 120 mg into the skin every 30 (thirty) days., Disp: , Rfl:  .  Halobetasol Propionate (BRYHALI) 0.01 % LOTN, Apply topically as needed., Disp: , Rfl:  .  hydroxychloroquine (PLAQUENIL) 200 MG tablet, Take 400 mg by mouth every morning. , Disp: , Rfl:  .  hydrOXYzine (ATARAX/VISTARIL) 25 MG tablet, Take 1 tablet (25 mg total) by mouth at bedtime as needed for itching (insomnia)., Disp: 60 tablet, Rfl: 0 .  levothyroxine (SYNTHROID, LEVOTHROID) 88 MCG tablet, TAKE ONE TABLET BY MOUTH EVERY DAY, Disp: 30 tablet, Rfl: 2 .  losartan (COZAAR) 50 MG tablet, Take 50 mg by mouth daily. 1/2 tab bid, Disp: , Rfl:  .  meloxicam (MOBIC) 7.5 MG tablet, TAKE 1 TABLET BY MOUTH DAILY, Disp: 30 tablet, Rfl: 2 .  omeprazole (PRILOSEC) 40 MG capsule, TAKE 1 CAPSULE EVERY DAY BEFORE BREAKFAST, Disp: 30 capsule, Rfl: 1 .  ONE TOUCH ULTRA TEST test strip, USE AS DIRECTED TO CHECK BLOOD SUGAR ONCE A DAY, Disp: 100 each, Rfl: 6 .  OVER THE COUNTER MEDICATION, Take 5 capsules by mouth daily. Ariix Optimals Vitamin & Minerals , Disp: , Rfl:  .  OVER THE COUNTER MEDICATION, Take 1 scoop by mouth daily. Ariix Magnecal D , Disp: , Rfl:  .  spironolactone (ALDACTONE) 25 MG tablet, Take 1 tablet (25 mg total) by mouth daily., Disp: 90 tablet, Rfl: 1 .  topiramate (TOPAMAX) 25 MG tablet, Take 1 tablet (25 mg total) by mouth 2 (two) times daily., Disp: 60 tablet, Rfl: 3 .  triamcinolone (KENALOG) 0.025 % ointment, Apply 1 application topically 2 (two) times daily., Disp: 454 g, Rfl: 0 .  Wheat Dextrin (BENEFIBER) POWD, Take 2 scoop by mouth daily as needed (for constipation). , Disp: , Rfl:    Allergies    Allergen Reactions  . Gabapentin (Once-Daily)     Depression   . Saxenda [Liraglutide -Weight Management] Other (See Comments)    Depression   . Sulfa Antibiotics     itching  . Trintellix [Vortioxetine] Other (See Comments)    GI issues   . Adhesive [Tape] Rash  . Codeine Itching   Review of Systems   Pertinent items are noted in the HPI. Otherwise, a complete ROS is negative.  Vitals:   Vitals:   05/13/18 0953  BP: 100/70  Pulse: 91  Temp: 98.4 F (36.9 C)  TempSrc: Oral  SpO2: 100%  Weight: 239 lb 12.8 oz (108.8 kg)  Height: 5\' 3"  (1.6 m)     Body mass  index is 42.48 kg/m.  Physical Exam:   Physical Exam Vitals signs and nursing note reviewed.  HENT:     Head: Normocephalic and atraumatic.  Eyes:     Pupils: Pupils are equal, round, and reactive to light.  Neck:     Musculoskeletal: Normal range of motion and neck supple.  Cardiovascular:     Rate and Rhythm: Normal rate and regular rhythm.     Heart sounds: Normal heart sounds.  Pulmonary:     Effort: Pulmonary effort is normal.  Abdominal:     Palpations: Abdomen is soft.  Skin:    General: Skin is warm.  Psychiatric:        Behavior: Behavior normal.    Assessment and Plan:   Diagnoses and all orders for this visit:  Acquired hypothyroidism -     TSH -     T4, free  Myofascial pain dysfunction syndrome -     Pain Mgmt, Profile 8 w/Conf, U -     oxyCODONE-acetaminophen (PERCOCET) 10-325 MG tablet; Take 1 tablet by mouth every 6 (six) hours as needed for up to 7 days for pain.  Morbid obesity (HCC)  Chronic systolic congestive heart failure (HCC), EF 35-40%  Encounter for therapeutic drug monitoring -     Pain Mgmt, Profile 8 w/Conf, U  Classical migraine without intractable migraine -     topiramate (TOPAMAX) 25 MG tablet; Take 1 tablet (25 mg total) by mouth 2 (two) times daily.  Intertrigo -     nystatin-triamcinolone ointment (MYCOLOG); Apply 1 application topically 2 (two) times  daily.   . Orders and follow up as documented in EpicCare, reviewed diet, exercise and weight control, cardiovascular risk and specific lipid/LDL goals reviewed, reviewed medications and side effects in detail.  . Reviewed expectations re: course of current medical issues. . Outlined signs and symptoms indicating need for more acute intervention. . Patient verbalized understanding and all questions were answered. . Patient received an After Visit Summary.  Helane Rima, DO [place], [place] 05/15/2018  CMA served as Neurosurgeon during this visit. History, Physical, and Plan performed by medical provider. The above documentation has been reviewed and is accurate and complete. Helane Rima, D.O.

## 2018-05-15 ENCOUNTER — Encounter: Payer: Self-pay | Admitting: Family Medicine

## 2018-05-15 ENCOUNTER — Encounter: Payer: Self-pay | Admitting: Physician Assistant

## 2018-05-15 DIAGNOSIS — F325 Major depressive disorder, single episode, in full remission: Secondary | ICD-10-CM | POA: Insufficient documentation

## 2018-05-15 DIAGNOSIS — E119 Type 2 diabetes mellitus without complications: Secondary | ICD-10-CM | POA: Insufficient documentation

## 2018-05-15 MED ORDER — LEVOTHYROXINE SODIUM 100 MCG PO TABS: 100.0000 ug | ORAL_TABLET | Freq: Every day | ORAL | 3 refills | Status: DC

## 2018-05-15 NOTE — Addendum Note (Signed)
Addended by: Helane Rima R on: 05/15/2018 12:43 PM   Modules accepted: Orders

## 2018-05-16 LAB — PAIN MGMT, PROFILE 8 W/CONF, U
6 Acetylmorphine: NEGATIVE ng/mL (ref <10)
Alcohol Metabolites: NEGATIVE ng/mL (ref <500)
Alphahydroxyalprazolam: NEGATIVE ng/mL (ref <25)
Alphahydroxymidazolam: NEGATIVE ng/mL (ref <50)
Alphahydroxytriazolam: NEGATIVE ng/mL (ref <50)
Aminoclonazepam: NEGATIVE ng/mL (ref <25)
Amphetamines: NEGATIVE ng/mL (ref <500)
Benzodiazepines: POSITIVE ng/mL — AB (ref <100)
Buprenorphine, Urine: NEGATIVE ng/mL (ref <5)
Buprenorphine: NEGATIVE ng/mL (ref <2)
Cocaine Metabolite: NEGATIVE ng/mL (ref <150)
Codeine: NEGATIVE ng/mL (ref <50)
Creatinine: 170.4 mg/dL
Hydrocodone: NEGATIVE ng/mL (ref <50)
Hydromorphone: NEGATIVE ng/mL (ref <50)
Hydroxyethylflurazepam: NEGATIVE ng/mL (ref <50)
Lorazepam: NEGATIVE ng/mL (ref <50)
MDMA: NEGATIVE ng/mL (ref <500)
Marijuana Metabolite: NEGATIVE ng/mL (ref <20)
Morphine: NEGATIVE ng/mL (ref <50)
Norbuprenorphine: NEGATIVE ng/mL (ref <2)
Nordiazepam: 397 ng/mL — ABNORMAL HIGH (ref <50)
Norhydrocodone: NEGATIVE ng/mL (ref <50)
Noroxycodone: 3300 ng/mL — ABNORMAL HIGH (ref <50)
Opiates: NEGATIVE ng/mL (ref <100)
Oxazepam: 465 ng/mL — ABNORMAL HIGH (ref <50)
Oxidant: NEGATIVE ug/mL (ref <200)
Oxycodone: 2358 ng/mL — ABNORMAL HIGH (ref <50)
Oxycodone: POSITIVE ng/mL — AB (ref <100)
Oxymorphone: 57 ng/mL — ABNORMAL HIGH (ref <50)
Temazepam: 473 ng/mL — ABNORMAL HIGH (ref <50)
pH: 6.2 (ref 4.5–9.0)

## 2018-05-26 ENCOUNTER — Other Ambulatory Visit: Payer: Self-pay | Admitting: *Deleted

## 2018-05-26 ENCOUNTER — Other Ambulatory Visit: Payer: Self-pay | Admitting: Family Medicine

## 2018-05-26 DIAGNOSIS — M7918 Myalgia, other site: Secondary | ICD-10-CM

## 2018-05-26 NOTE — Telephone Encounter (Signed)
Copied from CRM [id number]. Topic: General - Other >> May 26, 2018  1:16 PM Leafy Ro wrote: Reason for CRM: pt is calling and needs refill on oxycodone 5 mg. Pt is out of med. Pt takes med for her chronic back pain. Pt saw dr Earlene Plater on 05-13-2018

## 2018-05-26 NOTE — Telephone Encounter (Signed)
Ok to refill 

## 2018-05-26 NOTE — Telephone Encounter (Signed)
See note

## 2018-05-26 NOTE — Telephone Encounter (Signed)
Yes - issue with insurance not covering for chronic pain. Please get it ready (last Rx) and send back.

## 2018-05-27 MED ORDER — OXYCODONE-ACETAMINOPHEN 10-325 MG PO TABS: 1.0000 | ORAL_TABLET | ORAL | 0 refills | Status: DC | PRN

## 2018-05-27 NOTE — Telephone Encounter (Signed)
Completed. 05/27/2018

## 2018-06-11 ENCOUNTER — Other Ambulatory Visit: Payer: Self-pay | Admitting: Family Medicine

## 2018-06-14 IMAGING — DX DG KNEE COMPLETE 4+V*L*
4 series · 4 of 4 positions shown · non-contrast
Comparison: 01/28/2006

CLINICAL DATA: Acute knee pain

EXAM:
LEFT KNEE - COMPLETE 4+ VIEW

[knee standing ap]
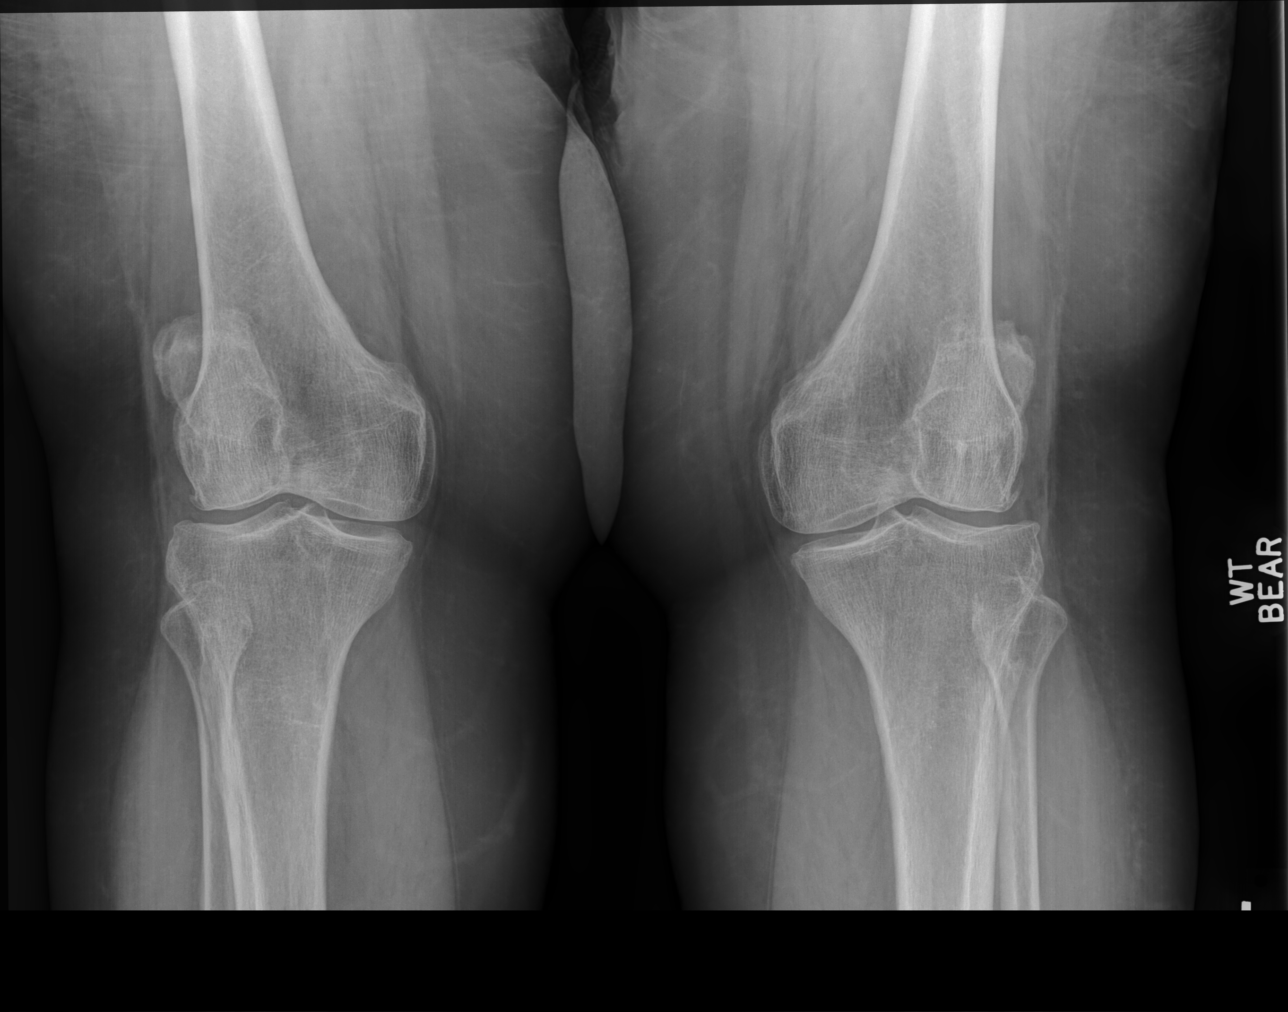

[knee standing lat]
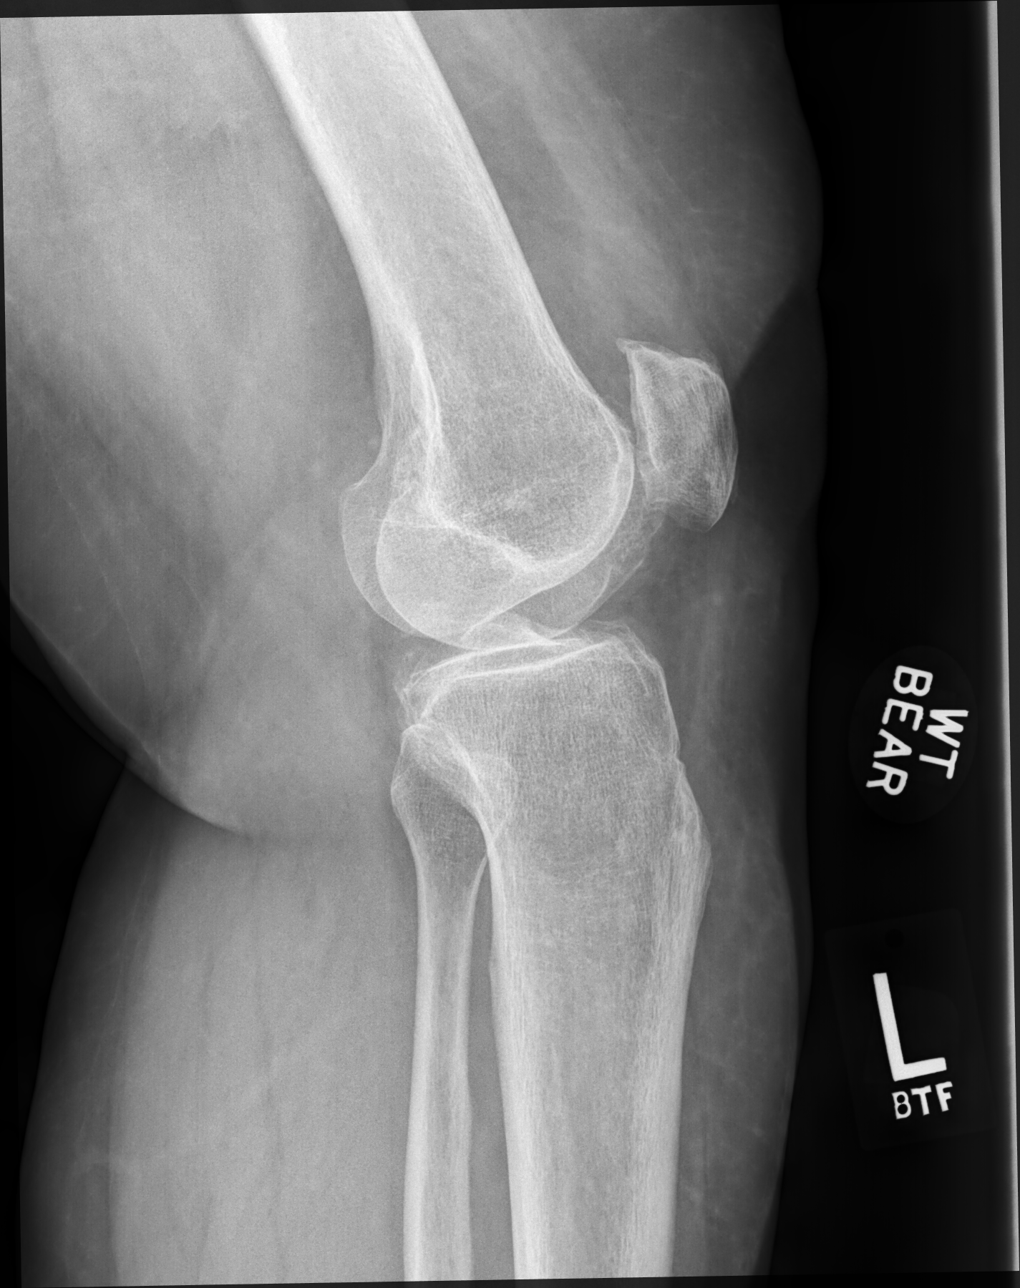

[sunrise]
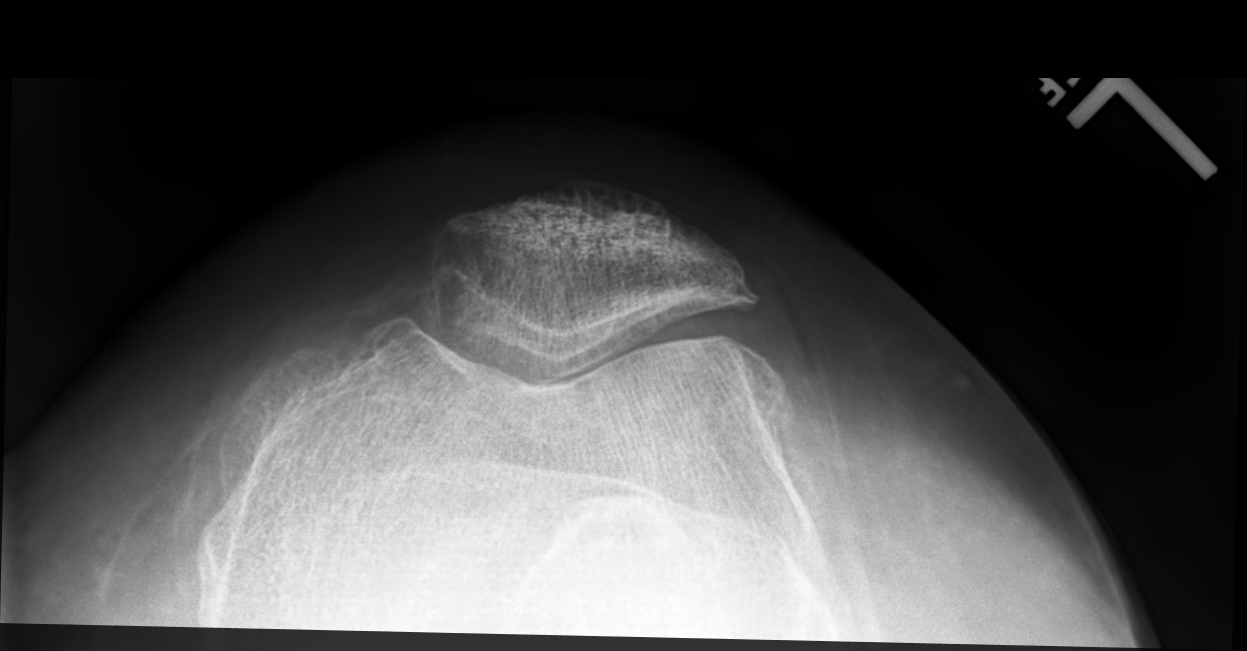

[knee ap]
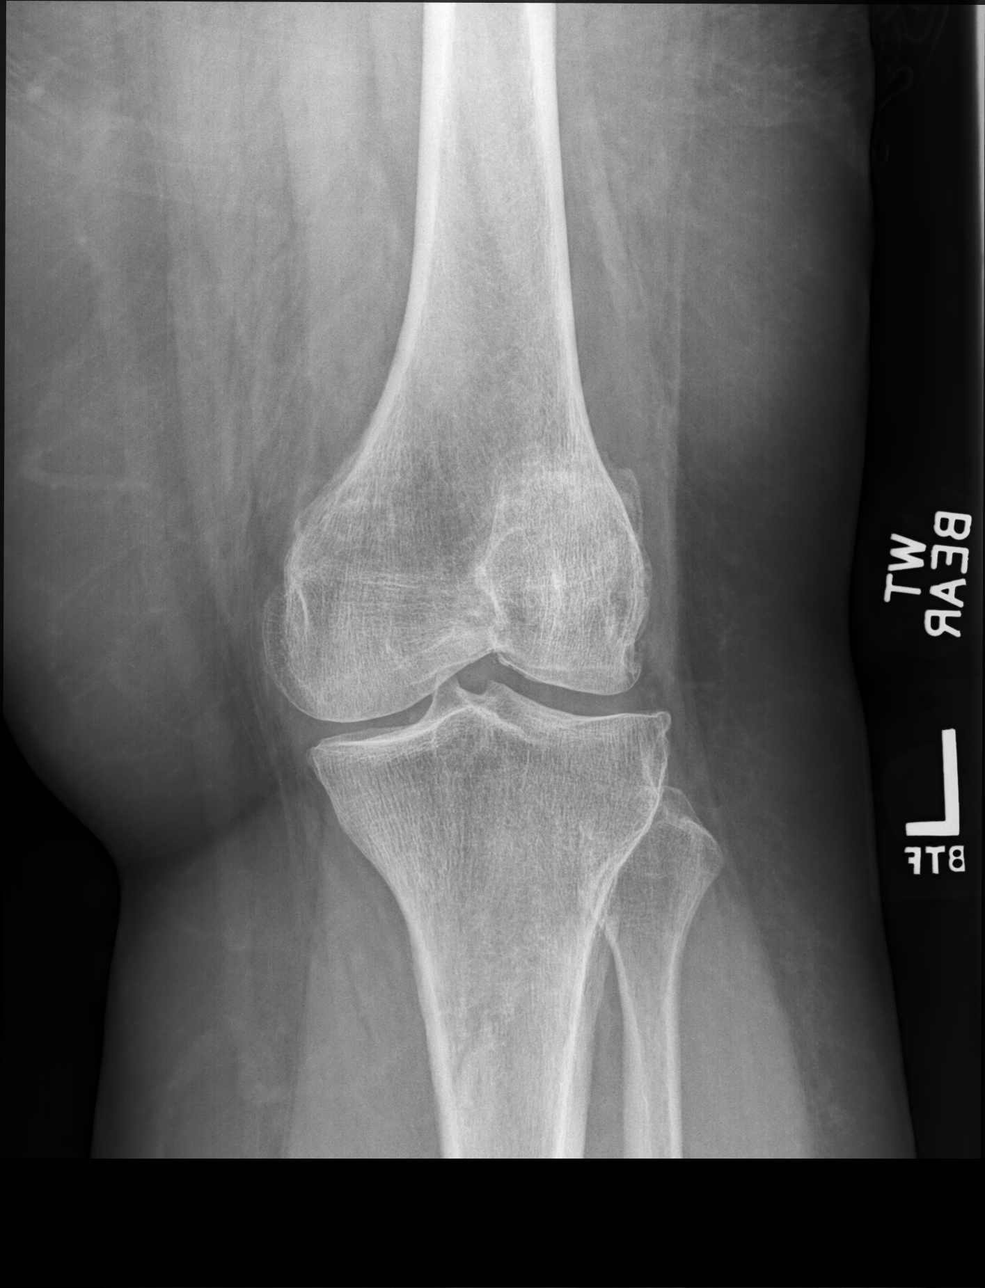

[4 of 4 positions shown; findings below may reference images not displayed]

FINDINGS: Progression of mild tricompartmental degenerative change. Negative
for fracture or effusion.
IMPRESSION: Mild tricompartmental degenerative change.

## 2018-07-22 ENCOUNTER — Other Ambulatory Visit: Payer: Self-pay | Admitting: Family Medicine

## 2018-07-22 DIAGNOSIS — M5441 Lumbago with sciatica, right side: Principal | ICD-10-CM

## 2018-07-22 DIAGNOSIS — G8929 Other chronic pain: Secondary | ICD-10-CM

## 2018-07-22 DIAGNOSIS — M5412 Radiculopathy, cervical region: Secondary | ICD-10-CM

## 2018-07-22 NOTE — Telephone Encounter (Signed)
See note

## 2018-07-22 NOTE — Telephone Encounter (Signed)
Copied from CRM [id number]. Topic: Quick Communication - Rx Refill/Question >> Jul 22, 2018 12:40 PM Jaquita Rector A wrote: Medication: diazepam (VALIUM) 5 MG tablet, meloxicam (MOBIC) 7.5 MG tablet   Per patient said she was told by  pharmacy refills were submitted on 07/15/2018   Has the patient contacted their pharmacy? Yes.   (Agent: If no, request that the patient contact the pharmacy for the refill.) (Agent: If yes, when and what did the pharmacy advise?)  Preferred Pharmacy (with phone number or street name): [facility] - [place], [place] - [address] [phone] (Phone) [phone] (Fax)    Agent: Please be advised that RX refills may take up to 3 business days. We ask that you follow-up with your pharmacy.

## 2018-07-23 MED ORDER — DIAZEPAM 5 MG PO TABS: ORAL_TABLET | ORAL | 1 refills | Status: DC

## 2018-07-23 MED ORDER — MELOXICAM 7.5 MG PO TABS: 7.5000 mg | ORAL_TABLET | Freq: Every day | ORAL | 2 refills | Status: DC

## 2018-08-05 ENCOUNTER — Telehealth: Payer: Self-pay | Admitting: *Deleted

## 2018-08-05 NOTE — Telephone Encounter (Signed)
Agreed to Webex. 

## 2018-08-11 ENCOUNTER — Other Ambulatory Visit: Payer: Self-pay | Admitting: Family Medicine

## 2018-08-11 DIAGNOSIS — H6983 Other specified disorders of Eustachian tube, bilateral: Secondary | ICD-10-CM

## 2018-08-11 NOTE — Telephone Encounter (Signed)
Pt scheduled for appt tomorrow. Refills can be provided at that time.

## 2018-08-12 ENCOUNTER — Ambulatory Visit (INDEPENDENT_AMBULATORY_CARE_PROVIDER_SITE_OTHER): Payer: 59 | Admitting: Family Medicine

## 2018-08-12 ENCOUNTER — Other Ambulatory Visit: Payer: Self-pay

## 2018-08-12 ENCOUNTER — Encounter: Payer: Self-pay | Admitting: Family Medicine

## 2018-08-12 DIAGNOSIS — K219 Gastro-esophageal reflux disease without esophagitis: Secondary | ICD-10-CM | POA: Diagnosis not present

## 2018-08-12 DIAGNOSIS — G43109 Migraine with aura, not intractable, without status migrainosus: Secondary | ICD-10-CM

## 2018-08-12 DIAGNOSIS — L298 Other pruritus: Secondary | ICD-10-CM

## 2018-08-12 DIAGNOSIS — M5441 Lumbago with sciatica, right side: Secondary | ICD-10-CM

## 2018-08-12 DIAGNOSIS — M3500 Sicca syndrome, unspecified: Secondary | ICD-10-CM

## 2018-08-12 DIAGNOSIS — G8929 Other chronic pain: Secondary | ICD-10-CM

## 2018-08-12 DIAGNOSIS — G2581 Restless legs syndrome: Secondary | ICD-10-CM

## 2018-08-12 MED ORDER — MELOXICAM 7.5 MG PO TABS: 7.5000 mg | ORAL_TABLET | Freq: Every day | ORAL | 0 refills | Status: DC

## 2018-08-12 MED ORDER — OXYCODONE-ACETAMINOPHEN 10-325 MG PO TABS: 1.0000 | ORAL_TABLET | ORAL | 0 refills | Status: DC | PRN

## 2018-08-12 MED ORDER — TOPIRAMATE 25 MG PO TABS: 25.0000 mg | ORAL_TABLET | Freq: Two times a day (BID) | ORAL | 3 refills | Status: AC

## 2018-08-12 MED ORDER — OMEPRAZOLE 40 MG PO CPDR: DELAYED_RELEASE_CAPSULE | ORAL | 1 refills | Status: DC

## 2018-08-12 MED ORDER — HYDROXYZINE HCL 25 MG PO TABS: 25.0000 mg | ORAL_TABLET | Freq: Every evening | ORAL | 1 refills | Status: DC | PRN

## 2018-08-12 MED ORDER — HYDROXYCHLOROQUINE SULFATE 200 MG PO TABS: 400.0000 mg | ORAL_TABLET | ORAL | 2 refills | Status: AC

## 2018-08-12 NOTE — Progress Notes (Signed)
Virtual Visit via Video   I connected with Kristine Curtis on 08/12/18 at 10:20 AM EDT by a video enabled telemedicine application and verified that I am speaking with the correct person using two identifiers. Location patient: Home Location provider:  [facility], Office Persons participating in the virtual visit: ONIKA EMIG, Helane Rima, DO [name], CMA acting as scribe for [name].   I discussed the limitations of evaluation and management by telemedicine and the availability of in person appointments. The patient expressed understanding and agreed to proceed.  Subjective:   HPI: Patient has been doing well. She has normal dry cough but nothing more than normal. Working on exercise. She has been taking all asthma medications and doing deep breathing exercises.     Her pain has been normal. She has cycles where she does not need for few days. Then when she will have a few days that she will need.   She has been taking iron supplement with not issues. She takes with middle of day snack.  She was started on mediation for RLS. Helping a lot. Sleeping much better.  Reviewed all precautions and expectations with prevention of Covid-19.  ROS: See pertinent positives and negatives per HPI.  Patient Active Problem List   Diagnosis Date Noted  . Major depression in complete remission (HCC) 05/15/2018  . Carpal tunnel syndrome of right wrist 12/22/2017  . Tinea pedis 11/28/2017  . IBS (irritable bowel syndrome) 11/10/2017  . B12 deficiency 11/10/2017  . OSA (obstructive sleep apnea) 11/10/2017  . Myalgia due to statin 11/10/2017  . Lumbar disc disease 10/01/2017  . RLS (restless legs syndrome) 10/01/2017  . Myofascial pain dysfunction syndrome 07/14/2017  . Blind right eye 07/14/2017  . Cervical radiculopathy 07/14/2017  . Degenerative arthritis of left knee, XRAY12/2018 07/13/2017  . Dizziness 03/01/2017  . Morbid obesity (HCC) 02/24/2017  . Sjogren's  disease (HCC) 02/24/2017  . Dense breasts 02/03/2017  . Chronic fatigue syndrome with fibromyalgia 01/21/2017  . Failed back syndrome of lumbar spine 12/30/2016  . Chronic low back pain with sciatica 05/02/2015  . Chronic systolic congestive heart failure (HCC), EF 35-40% 03/22/2015  . Sicca (HCC) 11/22/2014  . Family history of polycystic kidney, with negative CT 11/16/2012  . Vitamin D deficiency, on daily vitamin D 10/09/2009  . Hyperlipidemia, with high triglycerides and low HDL, not on statin 11/15/2008  . Depression with anxiety 08/31/2008  . Essential hypertension, on Losartan, Coreg, and Spironolactone 08/31/2008  . Allergic rhinitis, on Flonase 08/31/2008  . Asthma, Symbicort 08/31/2008  . GERD, on Ompeprazole 08/31/2008  . Osteoarthritis 08/31/2008  . Hypothyroidism, on Levothyroxine 08/30/2008  . Classical migraine without intractable migraine 08/30/2008  . Abnormal Pap smear of cervix 05/07/1995    Social History   Tobacco Use  . Smoking status: Former Smoker    Packs/day: 2.00    Years: 12.00    Pack years: 24.00    Types: Cigarettes    Last attempt to quit: 05/06/1989    Years since quitting: 29.2  . Smokeless tobacco: Never Used  . Tobacco comment: quit smoking 1990  Substance Use Topics  . Alcohol use: No    Alcohol/week: 0.0 standard drinks    Current Outpatient Medications:  .  ASMANEX 60 METERED DOSES 220 MCG/INH inhaler, INHALE 2 PUFFS INTO LUNGS EVERY DAY, Disp: 1 Inhaler, Rfl: 6 .  buPROPion (WELLBUTRIN XL) 300 MG 24 hr tablet, Take 1 tablet (300 mg total) by mouth daily., Disp: 30 tablet,  Rfl: 11 .  carvedilol (COREG) 12.5 MG tablet, Take 1 tablet (12.5 mg total) by mouth 2 (two) times daily with a meal., Disp: 180 tablet, Rfl: 3 .  Cholecalciferol (VITAMIN D-3) 25 MCG (1000 UT) CAPS, Take 1,000 Units by mouth daily. , Disp: , Rfl:  .  cyclobenzaprine (FLEXERIL) 5 MG tablet, Take 1 tablet by mouth daily., Disp: , Rfl:  .  diazepam (VALIUM) 5 MG tablet,  TAKE ONE TABLET EVERY SIX HOURS IF NEEDED, Disp: 30 tablet, Rfl: 1 .  fluticasone (FLONASE) 50 MCG/ACT nasal spray, USE 1 SPRAY IN EACH NOSTRIL EVERY DAY, Disp: 16 g, Rfl: 3 .  furosemide (LASIX) 20 MG tablet, Take 1 tablet (20 mg total) by mouth daily., Disp: 90 tablet, Rfl: 3 .  Galcanezumab-gnlm (EMGALITY) 120 MG/ML SOAJ, Inject 120 mg into the skin every 30 (thirty) days., Disp: , Rfl:  .  Halobetasol Propionate (BRYHALI) 0.01 % LOTN, Apply topically as needed., Disp: , Rfl:  .  hydroxychloroquine (PLAQUENIL) 200 MG tablet, Take 400 mg by mouth every morning. , Disp: , Rfl:  .  hydrOXYzine (ATARAX/VISTARIL) 25 MG tablet, Take 1 tablet (25 mg total) by mouth at bedtime as needed for itching (insomnia)., Disp: 60 tablet, Rfl: 0 .  levothyroxine (SYNTHROID, LEVOTHROID) 100 MCG tablet, Take 1 tablet (100 mcg total) by mouth daily., Disp: 90 tablet, Rfl: 3 .  losartan (COZAAR) 25 MG tablet, Take 1 tablet (25 mg total) by mouth 2 (two) times daily., Disp: 180 tablet, Rfl: 3 .  meloxicam (MOBIC) 7.5 MG tablet, Take 1 tablet (7.5 mg total) by mouth daily., Disp: 30 tablet, Rfl: 2 .  nystatin-triamcinolone ointment (MYCOLOG), Apply 1 application topically 2 (two) times daily., Disp: 30 g, Rfl: 5 .  omeprazole (PRILOSEC) 40 MG capsule, TAKE 1 CAPSULE BY MOUTH DAILY BEFORE BREAKFAST, Disp: 30 capsule, Rfl: 1 .  ONE TOUCH ULTRA TEST test strip, USE AS DIRECTED TO CHECK BLOOD SUGAR ONCE A DAY, Disp: 100 each, Rfl: 6 .  OVER THE COUNTER MEDICATION, Take 5 capsules by mouth daily. Ariix Optimals Vitamin & Minerals , Disp: , Rfl:  .  OVER THE COUNTER MEDICATION, Take 1 scoop by mouth daily. Ariix Magnecal D , Disp: , Rfl:  .  oxyCODONE-acetaminophen (PERCOCET) 10-325 MG tablet, Take 1 tablet by mouth every 4 (four) hours as needed for pain., Disp: 28 tablet, Rfl: 0 .  spironolactone (ALDACTONE) 25 MG tablet, Take 1 tablet (25 mg total) by mouth daily., Disp: 90 tablet, Rfl: 3 .  topiramate (TOPAMAX) 25 MG  tablet, Take 1 tablet (25 mg total) by mouth 2 (two) times daily., Disp: 60 tablet, Rfl: 3 .  triamcinolone (KENALOG) 0.025 % ointment, Apply 1 application topically 2 (two) times daily., Disp: 454 g, Rfl: 0 .  Wheat Dextrin (BENEFIBER) POWD, Take 2 scoop by mouth daily as needed (for constipation). , Disp: , Rfl:   Allergies  Allergen Reactions  . Gabapentin (Once-Daily)     Depression   . Saxenda [Liraglutide -Weight Management] Other (See Comments)    Depression   . Sulfa Antibiotics     itching  . Trintellix [Vortioxetine] Other (See Comments)    GI issues   . Adhesive [Tape] Rash  . Codeine Itching    Objective:   VITALS: Per patient if applicable, see vitals. GENERAL: Alert, appears well and in no acute distress. HEENT: Atraumatic, conjunctiva clear, no obvious abnormalities on inspection of external nose and ears. NECK: Normal movements of the head and neck. CARDIOPULMONARY:  No increased WOB. Speaking in clear sentences. I:E ratio WNL.  MS: Moves all visible extremities without noticeable abnormality. PSYCH: Pleasant and cooperative, well-groomed. Speech normal rate and rhythm. Affect is appropriate. Insight and judgement are appropriate. Attention is focused, linear, and appropriate.  NEURO: CN grossly intact. Oriented as arrived to appointment on time with no prompting. Moves both UE equally.  SKIN: No obvious lesions, wounds, erythema, or cyanosis noted on face or hands.  Assessment and Plan:   Leeda was seen today for follow-up.  Diagnoses and all orders for this visit:  Chronic right-sided low back pain with right-sided sciatica -     oxyCODONE-acetaminophen (PERCOCET) 10-325 MG tablet; Take 1 tablet by mouth every 4 (four) hours as needed for pain. -     meloxicam (MOBIC) 7.5 MG tablet; Take 1 tablet (7.5 mg total) by mouth daily.  Pruritic erythematous rash Comments: Now followed by Dermatology and improving. Dx as being "hypersensitive." Improving with  Triamcinolone/Cerave compounded cream. Orders: -     hydrOXYzine (ATARAX/VISTARIL) 25 MG tablet; Take 1 tablet (25 mg total) by mouth at bedtime as needed for itching (insomnia).  Classical migraine without intractable migraine -     topiramate (TOPAMAX) 25 MG tablet; Take 1 tablet (25 mg total) by mouth 2 (two) times daily.  Gastroesophageal reflux disease without esophagitis -     omeprazole (PRILOSEC) 40 MG capsule; TAKE 1 CAPSULE BY MOUTH DAILY BEFORE BREAKFAST  RLS (restless legs syndrome)  Sjogren's syndrome, with unspecified organ involvement (HCC) -     hydroxychloroquine (PLAQUENIL) 200 MG tablet; Take 2 tablets (400 mg total) by mouth every morning.    . Reviewed expectations re: course of current medical issues. . Discussed self-management of symptoms. . Outlined signs and symptoms indicating need for more acute intervention. . Patient verbalized understanding and all questions were answered. Marland Kitchen Health Maintenance issues including appropriate healthy diet, exercise, and smoking avoidance were discussed with patient. . See orders for this visit as documented in the electronic medical record.  Helane Rima, DO 08/12/2018

## 2018-09-09 ENCOUNTER — Other Ambulatory Visit: Payer: Self-pay | Admitting: Family Medicine

## 2018-09-09 DIAGNOSIS — H6983 Other specified disorders of Eustachian tube, bilateral: Secondary | ICD-10-CM

## 2019-01-21 ENCOUNTER — Other Ambulatory Visit: Payer: Self-pay | Admitting: Family Medicine

## 2019-01-21 DIAGNOSIS — G8929 Other chronic pain: Secondary | ICD-10-CM

## 2019-01-21 DIAGNOSIS — M5441 Lumbago with sciatica, right side: Secondary | ICD-10-CM

## 2019-02-03 ENCOUNTER — Other Ambulatory Visit: Payer: Self-pay

## 2019-02-03 ENCOUNTER — Telehealth: Payer: Self-pay

## 2019-02-03 ENCOUNTER — Ambulatory Visit (INDEPENDENT_AMBULATORY_CARE_PROVIDER_SITE_OTHER): Payer: Medicare Other | Admitting: Family Medicine

## 2019-02-03 ENCOUNTER — Encounter: Payer: Self-pay | Admitting: Family Medicine

## 2019-02-03 VITALS — BP 120/70 | HR 96 | Temp 97.9°F | Ht 63.0 in | Wt 225.0 lb

## 2019-02-03 DIAGNOSIS — E039 Hypothyroidism, unspecified: Secondary | ICD-10-CM | POA: Diagnosis not present

## 2019-02-03 DIAGNOSIS — R739 Hyperglycemia, unspecified: Secondary | ICD-10-CM

## 2019-02-03 DIAGNOSIS — E538 Deficiency of other specified B group vitamins: Secondary | ICD-10-CM | POA: Diagnosis not present

## 2019-02-03 DIAGNOSIS — G2581 Restless legs syndrome: Secondary | ICD-10-CM | POA: Diagnosis not present

## 2019-02-03 DIAGNOSIS — M7918 Myalgia, other site: Secondary | ICD-10-CM | POA: Diagnosis not present

## 2019-02-03 DIAGNOSIS — Z23 Encounter for immunization: Secondary | ICD-10-CM | POA: Diagnosis not present

## 2019-02-03 DIAGNOSIS — E559 Vitamin D deficiency, unspecified: Secondary | ICD-10-CM | POA: Diagnosis not present

## 2019-02-03 DIAGNOSIS — E782 Mixed hyperlipidemia: Secondary | ICD-10-CM | POA: Diagnosis not present

## 2019-02-03 LAB — TSH: TSH: 1.86 u[IU]/mL (ref 0.35–4.50)

## 2019-02-03 LAB — CBC WITH DIFFERENTIAL/PLATELET
Basophils Absolute: 0 10*3/uL (ref 0.0–0.1)
Basophils Relative: 0.6 % (ref 0.0–3.0)
Eosinophils Absolute: 0.2 10*3/uL (ref 0.0–0.7)
Eosinophils Relative: 2.6 % (ref 0.0–5.0)
HCT: 39.3 % (ref 36.0–46.0)
Hemoglobin: 13.4 g/dL (ref 12.0–15.0)
Lymphocytes Relative: 28.6 % (ref 12.0–46.0)
Lymphs Abs: 1.7 10*3/uL (ref 0.7–4.0)
MCHC: 34.2 g/dL (ref 30.0–36.0)
MCV: 95 fl (ref 78.0–100.0)
Monocytes Absolute: 0.4 10*3/uL (ref 0.1–1.0)
Monocytes Relative: 6.2 % (ref 3.0–12.0)
Neutro Abs: 3.7 10*3/uL (ref 1.4–7.7)
Neutrophils Relative %: 62 % (ref 43.0–77.0)
Platelets: 231 10*3/uL (ref 150.0–400.0)
RBC: 4.14 Mil/uL (ref 3.87–5.11)
RDW: 12.3 % (ref 11.5–15.5)
WBC: 6 10*3/uL (ref 4.0–10.5)

## 2019-02-03 LAB — COMPREHENSIVE METABOLIC PANEL
ALT: 26 U/L (ref 0–35)
AST: 19 U/L (ref 0–37)
Albumin: 4.2 g/dL (ref 3.5–5.2)
Alkaline Phosphatase: 116 U/L (ref 39–117)
BUN: 14 mg/dL (ref 6–23)
CO2: 24 mEq/L (ref 19–32)
Calcium: 9.3 mg/dL (ref 8.4–10.5)
Chloride: 108 mEq/L (ref 96–112)
Creatinine, Ser: 0.86 mg/dL (ref 0.40–1.20)
GFR: 67.55 mL/min (ref >60.00)
Glucose, Bld: 95 mg/dL (ref 70–99)
Potassium: 3.9 mEq/L (ref 3.5–5.1)
Sodium: 140 mEq/L (ref 135–145)
Total Bilirubin: 0.5 mg/dL (ref 0.2–1.2)
Total Protein: 6.4 g/dL (ref 6.0–8.3)

## 2019-02-03 LAB — MAGNESIUM: Magnesium: 1.9 mg/dL (ref 1.5–2.5)

## 2019-02-03 LAB — LIPID PANEL
Cholesterol: 170 mg/dL (ref 0–200)
HDL: 50.2 mg/dL (ref >39.00)
LDL Cholesterol: 93 mg/dL (ref 0–99)
NonHDL: 119.47
Total CHOL/HDL Ratio: 3
Triglycerides: 132 mg/dL (ref 0.0–149.0)
VLDL: 26.4 mg/dL (ref 0.0–40.0)

## 2019-02-03 LAB — HEMOGLOBIN A1C: Hgb A1c MFr Bld: 4.8 % (ref 4.6–6.5)

## 2019-02-03 LAB — VITAMIN D 25 HYDROXY (VIT D DEFICIENCY, FRACTURES): VITD: 58.34 ng/mL (ref 30.00–100.00)

## 2019-02-03 LAB — VITAMIN B12: Vitamin B-12: >1500 pg/mL — ABNORMAL HIGH (ref 211–911)

## 2019-02-03 LAB — T4, FREE: Free T4: 0.99 ng/dL (ref 0.60–1.60)

## 2019-02-03 NOTE — Telephone Encounter (Signed)
error 

## 2019-02-03 NOTE — Progress Notes (Signed)
Kristine Curtis is a 58 y.o. female is here for follow up.  History of Present Illness:   HPI: See Assessment and Plan section for Problem Based Charting of issues discussed today.   Health Maintenance Due  Topic Date Due  . MAMMOGRAM  03/27/2018   Depression screen [facility] 2/9 02/03/2019 02/10/2018 11/10/2017  Decreased Interest 0 0 0  Down, Depressed, Hopeless 0 0 0  PHQ - 2 Score 0 0 0  Altered sleeping 1 2 1   Tired, decreased energy 1 2 2   Change in appetite 0 0 1  Feeling bad or failure about yourself  0 0 0  Trouble concentrating 0 3 2  Moving slowly or fidgety/restless 0 0 -  Suicidal thoughts 0 0 0  PHQ-9 Score 2 7 6   Difficult doing work/chores Not difficult at all Not difficult at all Somewhat difficult  Some recent data might be hidden   PMHx, SurgHx, SocialHx, FamHx, Medications, and Allergies were reviewed in the Visit Navigator and updated as appropriate.   Patient Active Problem List   Diagnosis Date Noted  . Major depression in complete remission (HCC) 05/15/2018  . Carpal tunnel syndrome of right wrist 12/22/2017  . Tinea pedis 11/28/2017  . IBS (irritable bowel syndrome) 11/10/2017  . B12 deficiency 11/10/2017  . OSA (obstructive sleep apnea) 11/10/2017  . Myalgia due to statin 11/10/2017  . Lumbar disc disease 10/01/2017  . RLS (restless legs syndrome) 10/01/2017  . Myofascial pain dysfunction syndrome 07/14/2017  . Blind right eye 07/14/2017  . Cervical radiculopathy 07/14/2017  . Degenerative arthritis of left knee, XRAY12/2018 07/13/2017  . Dizziness 03/01/2017  . Morbid obesity (HCC) 02/24/2017  . Sjogren's disease (HCC) 02/24/2017  . Dense breasts 02/03/2017  . Chronic fatigue syndrome with fibromyalgia 01/21/2017  . Failed back syndrome of lumbar spine 12/30/2016  . Chronic low back pain with sciatica 05/02/2015  . Chronic systolic congestive heart failure (HCC), EF 35-40% 03/22/2015  . Sicca (HCC) 11/22/2014  . Family history of polycystic  kidney, with negative CT 11/16/2012  . Vitamin D deficiency, on daily vitamin D 10/09/2009  . Hyperlipidemia, with high triglycerides and low HDL, not on statin 11/15/2008  . Depression with anxiety 08/31/2008  . Essential hypertension, on Losartan, Coreg, and Spironolactone 08/31/2008  . Allergic rhinitis, on Flonase 08/31/2008  . Asthma, Symbicort 08/31/2008  . GERD, on Ompeprazole 08/31/2008  . Osteoarthritis 08/31/2008  . Hypothyroidism, on Levothyroxine 08/30/2008  . Classical migraine without intractable migraine 08/30/2008  . Abnormal Pap smear of cervix 05/07/1995   Social History   Tobacco Use  . Smoking status: Former Smoker    Packs/day: 2.00    Years: 12.00    Pack years: 24.00    Types: Cigarettes    Quit date: 05/06/1989    Years since quitting: 29.7  . Smokeless tobacco: Never Used  . Tobacco comment: quit smoking 1990  Substance Use Topics  . Alcohol use: No    Alcohol/week: 0.0 standard drinks  . Drug use: No   Current Medications and Allergies   .  ASMANEX 60 METERED DOSES 220 MCG/INH inhaler, INHALE 2 PUFFS INTO LUNGS EVERY DAY, Disp: 1 Inhaler, Rfl: 6 .  buPROPion (WELLBUTRIN XL) 300 MG 24 hr tablet, Take 1 tablet (300 mg total) by mouth daily., Disp: 30 tablet, Rfl: 11 .  carvedilol (COREG) 12.5 MG tablet, Take 1 tablet (12.5 mg total) by mouth 2 (two) times daily with a meal., Disp: 180 tablet, Rfl: 3 .  Cholecalciferol (VITAMIN  D-3) 25 MCG (1000 UT) CAPS, Take 1,000 Units by mouth daily. , Disp: , Rfl:  .  cyclobenzaprine (FLEXERIL) 5 MG tablet, Take 1 tablet by mouth daily., Disp: , Rfl:  .  diazepam (VALIUM) 5 MG tablet, TAKE ONE TABLET EVERY SIX HOURS IF NEEDED, Disp: 30 tablet, Rfl: 1 .  fluticasone (FLONASE) 50 MCG/ACT nasal spray, 1 SPRAY IN EACH NOSTRIL EVERY DAY, Disp: 16 g, Rfl: 3 .  furosemide (LASIX) 20 MG tablet, Take 1 tablet (20 mg total) by mouth daily., Disp: 90 tablet, Rfl: 3 .  Galcanezumab-gnlm (EMGALITY) 120 MG/ML SOAJ, Inject 120 mg  into the skin every 30 (thirty) days., Disp: , Rfl:  .  Halobetasol Propionate (BRYHALI) 0.01 % LOTN, Apply topically as needed., Disp: , Rfl:  .  hydroxychloroquine (PLAQUENIL) 200 MG tablet, Take 2 tablets (400 mg total) by mouth every morning., Disp: 90 tablet, Rfl: 2 .  hydrOXYzine (ATARAX/VISTARIL) 25 MG tablet, Take 1 tablet (25 mg total) by mouth at bedtime as needed for itching (insomnia)., Disp: 180 tablet, Rfl: 1 .  levothyroxine (SYNTHROID, LEVOTHROID) 100 MCG tablet, Take 1 tablet (100 mcg total) by mouth daily., Disp: 90 tablet, Rfl: 3 .  losartan (COZAAR) 25 MG tablet, Take 1 tablet (25 mg total) by mouth 2 (two) times daily., Disp: 180 tablet, Rfl: 3 .  meloxicam (MOBIC) 7.5 MG tablet, TAKE ONE TABLET EVERY DAY, Disp: 90 tablet, Rfl: 0 .  nystatin-triamcinolone ointment (MYCOLOG), Apply 1 application topically 2 (two) times daily., Disp: 30 g, Rfl: 5 .  omeprazole (PRILOSEC) 40 MG capsule, TAKE 1 CAPSULE BY MOUTH DAILY BEFORE BREAKFAST, Disp: 90 capsule, Rfl: 1 .  OVER THE COUNTER MEDICATION, Take 5 capsules by mouth daily. Ariix Optimals Vitamin & Minerals , Disp: , Rfl:  .  OVER THE COUNTER MEDICATION, Take 1 scoop by mouth daily. Ariix Magnecal D , Disp: , Rfl:  .  oxyCODONE-acetaminophen (PERCOCET) 10-325 MG tablet, Take 1 tablet by mouth every 4 (four) hours as needed for pain., Disp: 28 tablet, Rfl: 0 .  rOPINIRole (REQUIP) 0.25 MG tablet, Take 0.25 mg by mouth 4 (four) times daily., Disp: , Rfl:  .  spironolactone (ALDACTONE) 25 MG tablet, Take 1 tablet (25 mg total) by mouth daily., Disp: 90 tablet, Rfl: 3 .  topiramate (TOPAMAX) 25 MG tablet, Take 1 tablet (25 mg total) by mouth 2 (two) times daily., Disp: 60 tablet, Rfl: 3 .  triamcinolone (KENALOG) 0.025 % ointment, Apply 1 application topically 2 (two) times daily., Disp: 454 g, Rfl: 0 .  Wheat Dextrin (BENEFIBER) POWD, Take 2 scoop by mouth daily as needed (for constipation). , Disp: , Rfl:    Allergies  Allergen  Reactions  . Gabapentin (Once-Daily)     Depression   . Saxenda [Liraglutide -Weight Management] Other (See Comments)    Depression   . Sulfa Antibiotics     itching  . Trintellix [Vortioxetine] Other (See Comments)    GI issues   . Adhesive [Tape] Rash  . Codeine Itching   Review of Systems   Pertinent items are noted in the HPI. Otherwise, a complete ROS is negative.  Vitals   Vitals:   02/03/19 1312  BP: 120/70  Pulse: 96  Temp: 97.9 F (36.6 C)  TempSrc: Temporal  SpO2: 98%  Weight: 225 lb (102.1 kg)  Height: 5\' 3"  (1.6 m)     Body mass index is 39.86 kg/m.  Physical Exam   Physical Exam Vitals signs and nursing note reviewed.  HENT:     Head: Normocephalic and atraumatic.  Eyes:     Pupils: Pupils are equal, round, and reactive to light.  Neck:     Musculoskeletal: Normal range of motion and neck supple.  Cardiovascular:     Rate and Rhythm: Normal rate and regular rhythm.     Heart sounds: Normal heart sounds.  Pulmonary:     Effort: Pulmonary effort is normal.  Abdominal:     Palpations: Abdomen is soft.  Skin:    General: Skin is warm.  Psychiatric:        Behavior: Behavior normal.    Assessment and Plan   Kristine Curtis was seen today for follow-up.  Diagnoses and all orders for this visit:  Hypothyroidism, unspecified type -     TSH -     T4, free  Need for immunization against influenza -     Flu Vaccine QUAD 36+ mos IM  B12 deficiency -     Vitamin B12  Myofascial pain dysfunction syndrome Comments: Doing well. Working with PT, Reflexology, Therapist. When pain starts, she does yoga, breathing techniques, etc. She rarely takes opioid.   Vitamin D deficiency, on daily vitamin D -     VITAMIN D 25 Hydroxy (Vit-D Deficiency, Fractures)  RLS (restless legs syndrome) -     CBC with Differential/Platelet -     Magnesium  Morbid obesity ([place]) Comments: She has lost weight since our last visit. Congratulated and offered more support.    Mixed hyperlipidemia -     Comprehensive metabolic panel -     Lipid panel  Elevated blood sugar level -     Hemoglobin A1c    . Orders and follow up as documented in [place], reviewed diet, exercise and weight control, cardiovascular risk and specific lipid/LDL goals reviewed, reviewed medications and side effects in detail.  . Reviewed expectations re: course of current medical issues. . Outlined signs and symptoms indicating need for more acute intervention. . Patient verbalized understanding and all questions were answered. . Patient received an After Visit Summary.  Briscoe Deutscher, DO [place] 02/04/2019

## 2019-02-26 ENCOUNTER — Telehealth: Payer: Self-pay | Admitting: Family Medicine

## 2019-02-26 NOTE — Telephone Encounter (Signed)
Called patient let her know that labs are at reception for her to pick up at any time.

## 2019-02-26 NOTE — Telephone Encounter (Signed)
See note  Copied from [place] [id number]. Topic: General - Other >> Feb 26, 2019 11:21 AM Rainey Pines A wrote: Patient would like a callback from nurse in regards to picking up a print out of her most recent lab results today. Please advise.

## 2019-02-27 ENCOUNTER — Encounter (INDEPENDENT_AMBULATORY_CARE_PROVIDER_SITE_OTHER): Payer: Self-pay

## 2019-03-04 NOTE — Progress Notes (Signed)
Cardiology Office Note    Date:  03/08/2019   ID:  Kristine Curtis, DOB August 22, 1960, MRN [id number]  PCP:  Silverio Lay, MD  Cardiologist:  Dr. [place]   Chief Complaint  Patient presents with  . Congestive Heart Failure    History of Present Illness:  Kristine Curtis is a 58 y.o. female with PMH of NICM, PVCs, HTN, fibromyalgia, morbid obesity, hypothyroidism, RLS and vertigo.She had a cardiac catheterization in 1999 that showed normal coronaries. EF previously as low as 35%,EFimproved to 40-45% by2017. Cardiac MRI in 2017 showed EF 44% with global hypokinesis, no scar or infiltration. She was admitted in February 2018 with 24 hour history of chest pain, dyspnea and headache. Cardiac enzyme were negative. Limited echocardiogram showed EF 40-45% without pericardial effusion. Stress test showed fixed anteroseptal and inferoseptal defect without ischemia. EF was measured 35%. Despite Myoview showing no ischemia, she continued to have exertional chest discomfort and subsequently underwent a left and right heart cath 07/11/2016 which showed normal coronaries. She also had normal cardiac output and EF 35-40%. She had a sleep study that showed in the past no obstructive sleep apnea.She does have restless leg syndrome  Patient was admitted in September 2018 with a episode of syncope.  Outpatient 30-day event monitor was negative for significant arrhythmia.  On follow up today she is doing quite well. Denies any dyspnea, palpitations, dizziness, or chest pain. Feels like she is in a good place from a cardiac standpoint. No edema. Weight is down from earlier in the year. She is trying to eat healthier and exercise more in a sustained way.      Past Medical History:  Diagnosis Date  . Allergic rhinitis due to pollen   . Asthma   . Chronic combined systolic and diastolic CHF (congestive heart failure) (HCC)    06/2016 Echo: EF 40-45%, Gr1 DD  . Depression   . Family history of polycystic  kidney with negative CT 11/16/2012  . Fibromyalgia   . GERD (gastroesophageal reflux disease)   . Hypertension   . Hypothyroidism   . IBS (irritable bowel syndrome)   . Internal hemorrhoids   . Morbid obesity (HCC)   . NICM (nonischemic cardiomyopathy) (HCC)    a. 1999 Cath: nl cors;  b. EF prev as low as 35%;  c. 11/2015 Echo: Ef 40-45%;  d. 06/2016 Echo: EF 40-45%;  e. Lexiscan MV: fixed anterosepta/inferseptal defect w/o ischemia, EF 35%.  . Osteoarthritis   . PVC (premature ventricular contraction)   . Restless leg syndrome   . Sicca (HCC) 11/22/2014  . Vertigo     Past Surgical History:  Procedure Laterality Date  . ABDOMINAL HYSTERECTOMY    . CESAREAN SECTION    . CHOLECYSTECTOMY  10-2008  . COLONOSCOPY    . ESOPHAGOGASTRODUODENOSCOPY    . EYE SURGERY     2 2013 and 1981/DCR of right eye 05/2014  . LUMBAR DISC SURGERY  2016   L3-4  . RIGHT/LEFT HEART CATH AND CORONARY ANGIOGRAPHY N/A 07/11/2016   Procedure: Right/Left Heart Cath and Coronary Angiography;  Surgeon: Chevis Weisensel M [place], MD;  Location: [facility] INVASIVE CV LAB;  Service: Cardiovascular;  Laterality: N/A;  . SHOULDER ARTHROSCOPY W/ SUBACROMIAL DECOMPRESSION AND DISTAL CLAVICLE EXCISION Right   . SPINE SURGERY     C6-C7, 03/2017  . TUBAL LIGATION  1992    Current Medications: Outpatient Medications Prior to Visit  Medication Sig Dispense Refill  . ASMANEX 60 METERED DOSES 220 MCG/INH inhaler INHALE  2 PUFFS INTO LUNGS EVERY DAY 1 Inhaler 6  . buPROPion (WELLBUTRIN XL) 300 MG 24 hr tablet Take 1 tablet (300 mg total) by mouth daily. 30 tablet 11  . carvedilol (COREG) 12.5 MG tablet Take 1 tablet (12.5 mg total) by mouth 2 (two) times daily with a meal. 180 tablet 3  . Cholecalciferol (VITAMIN D-3) 25 MCG (1000 UT) CAPS Take 1,000 Units by mouth daily.     . cyclobenzaprine (FLEXERIL) 5 MG tablet Take 1 tablet by mouth daily.    . diazepam (VALIUM) 5 MG tablet TAKE ONE TABLET EVERY SIX HOURS IF NEEDED 30 tablet 1  .  Estradiol-Progesterone 1-100 MG CAPS Take 1 capsule by mouth at bedtime.    . fluticasone (FLONASE) 50 MCG/ACT nasal spray 1 SPRAY IN EACH NOSTRIL EVERY DAY 16 g 3  . furosemide (LASIX) 20 MG tablet Take 1 tablet (20 mg total) by mouth daily. 90 tablet 3  . Galcanezumab-gnlm (EMGALITY) 120 MG/ML SOAJ Inject 120 mg into the skin every 30 (thirty) days.    Marland Kitchen Halobetasol Propionate (BRYHALI) 0.01 % LOTN Apply topically as needed.    . hydroxychloroquine (PLAQUENIL) 200 MG tablet Take 2 tablets (400 mg total) by mouth every morning. 90 tablet 2  . hydrOXYzine (ATARAX/VISTARIL) 25 MG tablet Take 1 tablet (25 mg total) by mouth at bedtime as needed for itching (insomnia). 180 tablet 1  . levothyroxine (SYNTHROID, LEVOTHROID) 100 MCG tablet Take 1 tablet (100 mcg total) by mouth daily. 90 tablet 3  . losartan (COZAAR) 25 MG tablet Take 1 tablet (25 mg total) by mouth 2 (two) times daily. 180 tablet 3  . meloxicam (MOBIC) 7.5 MG tablet TAKE ONE TABLET EVERY DAY 90 tablet 0  . nystatin-triamcinolone ointment (MYCOLOG) Apply 1 application topically 2 (two) times daily. 30 g 5  . omeprazole (PRILOSEC) 40 MG capsule TAKE 1 CAPSULE BY MOUTH DAILY BEFORE BREAKFAST 90 capsule 1  . OVER THE COUNTER MEDICATION Take 5 capsules by mouth daily. Ariix Optimals Vitamin & Minerals     . OVER THE COUNTER MEDICATION Take 1 scoop by mouth daily. Ariix Magnecal D     . oxyCODONE-acetaminophen (PERCOCET) 10-325 MG tablet Take 1 tablet by mouth every 4 (four) hours as needed for pain. 28 tablet 0  . spironolactone (ALDACTONE) 25 MG tablet Take 1 tablet (25 mg total) by mouth daily. 90 tablet 3  . topiramate (TOPAMAX) 25 MG tablet Take 1 tablet (25 mg total) by mouth 2 (two) times daily. 60 tablet 3  . triamcinolone (KENALOG) 0.025 % ointment Apply 1 application topically 2 (two) times daily. 454 g 0  . Wheat Dextrin (BENEFIBER) POWD Take 2 scoop by mouth daily as needed (for constipation).      No facility-administered  medications prior to visit.      Allergies:   Gabapentin (once-daily), Saxenda [liraglutide -weight management], Sulfa antibiotics, Trintellix [vortioxetine], Adhesive [tape], and Codeine   Social History   Socioeconomic History  . Marital status: Married    Spouse name: Not on file  . Number of children: 2  . Years of education: Not on file  . Highest education level: Not on file  Occupational History  . Occupation: Nutritional therapist  Social Needs  . Financial resource strain: Not on file  . Food insecurity    Worry: Not on file    Inability: Not on file  . Transportation needs    Medical: Not on file    Non-medical: Not on file  Tobacco Use  . Smoking status: Former Smoker    Packs/day: 2.00    Years: 12.00    Pack years: 24.00    Types: Cigarettes    Quit date: 05/06/1989    Years since quitting: 29.8  . Smokeless tobacco: Never Used  . Tobacco comment: quit smoking 1990  Substance and Sexual Activity  . Alcohol use: No    Alcohol/week: 0.0 standard drinks  . Drug use: No  . Sexual activity: Not on file  Lifestyle  . Physical activity    Days per week: Not on file    Minutes per session: Not on file  . Stress: Not on file  Relationships  . Social Musicianconnections    Talks on phone: Not on file    Gets together: Not on file    Attends religious service: Not on file    Active member of club or organization: Not on file    Attends meetings of clubs or organizations: Not on file    Relationship status: Not on file  Other Topics Concern  . Not on file  Social History Narrative   Former Dr. Andrey CampanileWilson then Dr. Artis FlockKindl patient    Dr. Larey DresserMartha Ajlouny, podiatrist     Family History:  The patient's family history includes Diabetes in her father; Glaucoma in her father; Heart disease in her father; Kidney failure in her brother; Pancreatic cancer in her mother.   ROS:   Please see the history of present illness.    ROS All other systems reviewed and are negative.    PHYSICAL EXAM:   VS:  BP 117/75   Pulse 90   Ht 5\' 3"  (1.6 m)   Wt 227 lb 9.6 oz (103.2 kg)   SpO2 100%   BMI 40.32 kg/m    GEN: Well nourished, well developed, in no acute distress  HEENT: normal  Neck: no JVD, carotid bruits, or masses Cardiac: RRR; no murmurs, rubs, or gallops,no edema  Respiratory:  clear to auscultation bilaterally, normal work of breathing GI: soft, nontender, nondistended, + BS MS: no deformity or atrophy  Skin: warm and dry, no rash Neuro:  Alert and Oriented x 3, Strength and sensation are intact Psych: euthymic mood, full affect  Wt Readings from Last 3 Encounters:  03/08/19 227 lb 9.6 oz (103.2 kg)  02/03/19 225 lb (102.1 kg)  05/13/18 241 lb (109.3 kg)      Studies/Labs Reviewed:   EKG:  EKG is not ordered today.    Recent Labs: 02/03/2019: ALT 26; BUN 14; Creatinine, Ser 0.86; Hemoglobin 13.4; Magnesium 1.9; Platelets 231.0; Potassium 3.9; Sodium 140; TSH 1.86   Lipid Panel    Component Value Date/Time   CHOL 170 02/03/2019 1429   TRIG 132.0 02/03/2019 1429   HDL 50.20 02/03/2019 1429   CHOLHDL 3 02/03/2019 1429   VLDL 26.4 02/03/2019 1429   LDLCALC 93 02/03/2019 1429   LDLDIRECT 75.0 03/20/2016 0836    Additional studies/ records that were reviewed today include:   Echo 06/28/2016 LV EF: 40% -   45% Study Conclusions  - Left ventricle: The cavity size was mildly dilated. Wall   thickness was increased in a pattern of mild LVH. Systolic   function was mildly to moderately reduced. The estimated ejection   fraction was in the range of 40% to 45%. Diffuse hypokinesis.   Doppler parameters are consistent with abnormal left ventricular   relaxation (grade 1 diastolic dysfunction). - Left atrium: The atrium was mildly dilated.  Impressions:  -  Limited study to assess LV function; full doppler study not   performed; mild to moderate global reduction in LV systolic   function; grade 1 diastolic dysfunction; mild LVH; mild LVE;  mild   LAE.   Cath 07/11/2016 Conclusion     There is moderate left ventricular systolic dysfunction.  The left ventricular ejection fraction is 35-45% by visual estimate.  LV end diastolic pressure is mildly elevated.  1. Normal coronary anatomy 2. Moderate LV dysfunction- global. EF 35-40%.  3. Mildly elevated LVEDP 4. Normal pulmonary pressures. RA pressure is elevated. 5. Normal cardiac output.  Plan: continue medical therapy. Consider addition of aldactone to current therapy      ASSESSMENT:    No diagnosis found.   PLAN:  In order of problems listed above:  1. Chronic systolic CHF with Nonischemic cardiomyopathy: EF 35 to 40% on previous cardiac catheterization.  Continue carvedilol, losartan and spironolactone.  2. Hypertension: Blood pressure well controlled  3. Hypothyroidism: Managed by primary care provider.  4. PVCs: this is well controlled on carvedilol.    Medication Adjustments/Labs and Tests Ordered: Current medicines are reviewed at length with the patient today.  Concerns regarding medicines are outlined above.  Medication changes, Labs and Tests ordered today are listed in the Patient Instructions below. There are no Patient Instructions on file for this visit.   Signed, Danuel Felicetti [place], MD  03/08/2019 3:01 PM    [facility] [place], [place], [place]  [address] Phone: [phone]; Fax: [phone]

## 2019-03-08 ENCOUNTER — Encounter: Payer: Self-pay | Admitting: Cardiology

## 2019-03-08 ENCOUNTER — Other Ambulatory Visit: Payer: Self-pay

## 2019-03-08 ENCOUNTER — Ambulatory Visit (INDEPENDENT_AMBULATORY_CARE_PROVIDER_SITE_OTHER): Payer: Medicare Other | Admitting: Cardiology

## 2019-03-08 VITALS — BP 117/75 | HR 90 | Ht 63.0 in | Wt 227.6 lb

## 2019-03-08 DIAGNOSIS — I493 Ventricular premature depolarization: Secondary | ICD-10-CM | POA: Diagnosis not present

## 2019-03-08 DIAGNOSIS — I10 Essential (primary) hypertension: Secondary | ICD-10-CM

## 2019-03-08 DIAGNOSIS — I428 Other cardiomyopathies: Secondary | ICD-10-CM | POA: Diagnosis not present

## 2019-03-08 DIAGNOSIS — I5022 Chronic systolic (congestive) heart failure: Secondary | ICD-10-CM

## 2019-03-27 ENCOUNTER — Other Ambulatory Visit: Payer: Self-pay | Admitting: Family Medicine

## 2019-03-27 DIAGNOSIS — G8929 Other chronic pain: Secondary | ICD-10-CM

## 2019-04-15 ENCOUNTER — Telehealth: Payer: Self-pay | Admitting: Family Medicine

## 2019-04-15 NOTE — Telephone Encounter (Signed)
Medication Refill - Medication: omeprazole (PRILOSEC) 40 MG capsule [[id number]]  diazepam (VALIUM) 5 MG tablet [[id number]] oxyCODONE-acetaminophen (PERCOCET) 10-325 MG tablet      Has the patient contacted their pharmacy? No. (Agent: If no, request that the patient contact the pharmacy for the refill.) (Agent: If yes, when and what did the pharmacy advise?) [place] Phone:  [phone]      Preferred Pharmacy (with phone number or street name):   Agent: Please be advised that RX refills may take up to 3 business days. We ask that you follow-up with your pharmacy.

## 2019-04-16 NOTE — Telephone Encounter (Signed)
Please schedule patient for TOC and separate in office office visit for medication refill

## 2019-04-19 ENCOUNTER — Other Ambulatory Visit: Payer: Self-pay

## 2019-04-20 ENCOUNTER — Ambulatory Visit (INDEPENDENT_AMBULATORY_CARE_PROVIDER_SITE_OTHER): Payer: Medicare Other | Admitting: Family Medicine

## 2019-04-20 ENCOUNTER — Encounter: Payer: Self-pay | Admitting: Family Medicine

## 2019-04-20 VITALS — BP 118/82 | HR 96 | Temp 97.5°F | Ht 63.0 in | Wt 235.0 lb

## 2019-04-20 DIAGNOSIS — K219 Gastro-esophageal reflux disease without esophagitis: Secondary | ICD-10-CM | POA: Diagnosis not present

## 2019-04-20 DIAGNOSIS — E039 Hypothyroidism, unspecified: Secondary | ICD-10-CM | POA: Diagnosis not present

## 2019-04-20 DIAGNOSIS — M797 Fibromyalgia: Secondary | ICD-10-CM | POA: Diagnosis not present

## 2019-04-20 DIAGNOSIS — M7918 Myalgia, other site: Secondary | ICD-10-CM

## 2019-04-20 DIAGNOSIS — I10 Essential (primary) hypertension: Secondary | ICD-10-CM

## 2019-04-20 MED ORDER — OMEPRAZOLE 40 MG PO CPDR: 40.0000 mg | DELAYED_RELEASE_CAPSULE | Freq: Every day | ORAL | 3 refills | Status: DC

## 2019-04-20 MED ORDER — OXYCODONE-ACETAMINOPHEN 10-325 MG PO TABS: 1.0000 | ORAL_TABLET | Freq: Two times a day (BID) | ORAL | 0 refills | Status: DC | PRN

## 2019-04-20 MED ORDER — DIAZEPAM 5 MG PO TABS: 5.0000 mg | ORAL_TABLET | Freq: Every day | ORAL | 2 refills | Status: DC | PRN

## 2019-04-20 NOTE — Progress Notes (Signed)
Subjective  CC:  Chief Complaint  Patient presents with  . Follow-up    Patient needs medication refills  . Hypothyroidism    HPI: Kristine Curtis is a 58 y.o. female who presents to [facility] [place] today to establish care with me as a new patient. Former pt of Dr.Wallace. has TOC appt in march scheduled but needs refills. I reviewed her chart.  She has the following concerns or needs:  Fibromyalgia, chronic pain and myofascial pain syndrome: manages with neuroPT, exercises, stretches and at times uses oxycodone and valium for mm spasm. Has had cervical and lumbar surgeries by Dr. Danielle Dess. I reviewed patient's records from the PMP aware controlled substance registry today. Last oxy fill was 08/2017. Appropriate refills on valium as well. She denies AEs.   GERD on chronic high dose PPI; due refill. Follows with Dr. Leone Payor.  Reviewed extensive labwork from sept 2020. Seeing Delrae Rend MD for hormonal treatments. Reports tsh has trended upward from 2.0 to 4.3 over last 6 months. Nl TFTs.  htn has been well controlled. Feeling well. Taking medications w/o adverse effects. No symptoms of CHF, angina; no palpitations, sob, cp or lower extremity edema. Compliant with meds.    Assessment  1. Fibromyalgia   2. Myofascial pain dysfunction syndrome   3. Gastroesophageal reflux disease without esophagitis   4. Acquired hypothyroidism   5. Essential hypertension, on Losartan, Coreg, and Spironolactone      Plan   Pain issues are reportedly well controlled. Refilled meds for prn use. Will follow.  GERD refilled meds.   Low thyroid. Will recheck again and adjust meds up if TSH continues to trend upwards.  HTN is controlled. No change in meds today.   Follow up:  Return for as scheduled. No orders of the defined types were placed in this encounter.  Meds ordered this encounter  Medications  . omeprazole (PRILOSEC) 40 MG capsule    Sig: Take 1 capsule (40 mg  total) by mouth daily.    Dispense:  90 capsule    Refill:  3    Daily  . oxyCODONE-acetaminophen (PERCOCET) 10-325 MG tablet    Sig: Take 1 tablet by mouth 2 (two) times daily as needed for pain.    Dispense:  30 tablet    Refill:  0  . diazepam (VALIUM) 5 MG tablet    Sig: Take 1 tablet (5 mg total) by mouth daily as needed for anxiety.    Dispense:  30 tablet    Refill:  2     Depression screen [facility] 2/9 02/03/2019 02/10/2018 11/10/2017 08/11/2017 06/09/2017  Decreased Interest 0 0 0 1 0  Down, Depressed, Hopeless 0 0 0 1 0  PHQ - 2 Score 0 0 0 2 0  Altered sleeping 1 2 1  0 3  Tired, decreased energy 1 2 2 1 2   Change in appetite 0 0 1 0 0  Feeling bad or failure about yourself  0 0 0 0 0  Trouble concentrating 0 3 2 3 3   Moving slowly or fidgety/restless 0 0 - 0 0  Suicidal thoughts 0 0 0 - 0  PHQ-9 Score 2 7 6 6 8   Difficult doing work/chores Not difficult at all Not difficult at all Somewhat difficult Somewhat difficult -  Some recent data might be hidden    We updated and reviewed the patient's past history in detail and it is documented below.  Patient Active Problem List  Diagnosis Date Noted  . OSA (obstructive sleep apnea) 11/10/2017    Priority: High  . Myofascial pain dysfunction syndrome 07/14/2017    Priority: High  . Morbid obesity (HCC) 02/24/2017    Priority: High  . Sjogren's disease (HCC) 02/24/2017    Priority: High  . Fibromyalgia 01/21/2017    Priority: High  . Chronic low back pain with sciatica 05/02/2015    Priority: High    MRI 2016: Disc bulge with a superimposed right lateral recess protrusion at L3-4 impinges on the descending right L4 root and deforms right aspect of the thecal sac. Shallow disc bulge at L4-5 results in narrowing in the lateral recesses which could impact either descending L5 root.  Reviewed pain control and risks versus benefits of below medications. She used them prn and sparingly. No previous issues. Database reviewed.   .  Chronic systolic congestive heart failure (HCC), EF 35-40% 03/22/2015    Priority: High    06/28/16 ECHO  - Limited study to assess LV function; full doppler study not   performed; mild to moderate global reduction in LV systolic   function; grade 1 diastolic dysfunction; mild LVH; mild LVE; mild   LAE.  07/11/16 Right/Left Heart Cath and Coronary Angiography     There is moderate left ventricular systolic dysfunction.  The left ventricular ejection fraction is 35-45% by visual estimate.  LV end diastolic pressure is mildly elevated.   1. Normal coronary anatomy 2. Moderate LV dysfunction- global. EF 35-40%.  3. Mildly elevated LVEDP 4. Normal pulmonary pressures. RA pressure is elevated. 5. Normal cardiac output.  Plan: continue medical therapy. Consider addition of aldactone to current therapy      . Hyperlipidemia, with high triglycerides and low HDL, not on statin 11/15/2008    Priority: High  . Depression with anxiety 08/31/2008    Priority: High    Decompensating.  Patient has tried Cymbalta and Prozac in the past.  She states that she was put on these medications for headaches.  She is currently taking Wellbutrin only.  We discussed medication options and will trial treatment Trintellix.   Update. Trintellix caused GI issues. 11/10/2017     . Essential hypertension, on Losartan, Coreg, and Spironolactone 08/31/2008    Priority: High  . Hypothyroidism, on Levothyroxine 08/30/2008    Priority: High  . IBS (irritable bowel syndrome) 11/10/2017    Priority: Medium  . RLS (restless legs syndrome) 10/01/2017    Priority: Medium  . Asthma, Symbicort 08/31/2008    Priority: Medium  . GERD, on Ompeprazole 08/31/2008    Priority: Medium  . Classical migraine without intractable migraine 08/30/2008    Priority: Medium  . B12 deficiency 11/10/2017    Priority: Low  . Myalgia due to statin 11/10/2017    Priority: Low  . Degenerative arthritis of left knee, XRAY12/2018  07/13/2017    Priority: Low  . Vitamin D deficiency, on daily vitamin D 10/09/2009    Priority: Low  . Allergic rhinitis, on Flonase 08/31/2008    Priority: Low  . Lumbar disc disease 10/01/2017  . Cervical radiculopathy 07/14/2017  . Sicca (HCC) 11/22/2014   Health Maintenance  Topic Date Due  . MAMMOGRAM  04/23/2019 (Originally 03/27/2017)  . TETANUS/TDAP  02/07/2025  . COLONOSCOPY  02/13/2025  . INFLUENZA VACCINE  Completed  . Hepatitis C Screening  Completed  . HIV Screening  Completed   Immunization History  Administered Date(s) Administered  . Influenza,inj,Quad PF,6+ Mos 02/08/2015, 02/10/2018, 02/03/2019  . Tdap 02/08/2015  Current Meds  Medication Sig  . ASMANEX 60 METERED DOSES 220 MCG/INH inhaler INHALE 2 PUFFS INTO LUNGS EVERY DAY  . buPROPion (WELLBUTRIN XL) 300 MG 24 hr tablet Take 1 tablet (300 mg total) by mouth daily.  . carvedilol (COREG) 12.5 MG tablet Take 1 tablet (12.5 mg total) by mouth 2 (two) times daily with a meal.  . Cholecalciferol (VITAMIN D-3) 25 MCG (1000 UT) CAPS Take 1,000 Units by mouth daily.   . cyclobenzaprine (FLEXERIL) 5 MG tablet Take 1 tablet by mouth daily.  . diazepam (VALIUM) 5 MG tablet Take 1 tablet (5 mg total) by mouth daily as needed for anxiety.  . Estradiol-Progesterone 1-100 MG CAPS Take 1 capsule by mouth at bedtime.  . fluticasone (FLONASE) 50 MCG/ACT nasal spray 1 SPRAY IN EACH NOSTRIL EVERY DAY  . furosemide (LASIX) 20 MG tablet Take 1 tablet (20 mg total) by mouth daily.  . Galcanezumab-gnlm (EMGALITY) 120 MG/ML SOAJ Inject 120 mg into the skin every 30 (thirty) days.  Marland Kitchen Halobetasol Propionate (BRYHALI) 0.01 % LOTN Apply topically as needed.  . hydroxychloroquine (PLAQUENIL) 200 MG tablet Take 2 tablets (400 mg total) by mouth every morning.  . hydrOXYzine (ATARAX/VISTARIL) 25 MG tablet Take 1 tablet (25 mg total) by mouth at bedtime as needed for itching (insomnia).  Marland Kitchen levothyroxine (SYNTHROID, LEVOTHROID) 100 MCG  tablet Take 1 tablet (100 mcg total) by mouth daily.  Marland Kitchen losartan (COZAAR) 25 MG tablet Take 1 tablet (25 mg total) by mouth 2 (two) times daily.  . meloxicam (MOBIC) 7.5 MG tablet TAKE ONE TABLET EVERY DAY  . nystatin-triamcinolone ointment (MYCOLOG) Apply 1 application topically 2 (two) times daily.  Marland Kitchen omeprazole (PRILOSEC) 40 MG capsule Take 1 capsule (40 mg total) by mouth daily.  Marland Kitchen OVER THE COUNTER MEDICATION Take 5 capsules by mouth daily. Ariix Optimals Vitamin & Minerals   . OVER THE COUNTER MEDICATION Take 1 scoop by mouth daily. Ariix Magnecal D   . oxyCODONE-acetaminophen (PERCOCET) 10-325 MG tablet Take 1 tablet by mouth 2 (two) times daily as needed for pain.  Marland Kitchen spironolactone (ALDACTONE) 25 MG tablet Take 1 tablet (25 mg total) by mouth daily.  Marland Kitchen topiramate (TOPAMAX) 25 MG tablet Take 1 tablet (25 mg total) by mouth 2 (two) times daily.  Marland Kitchen triamcinolone (KENALOG) 0.025 % ointment Apply 1 application topically 2 (two) times daily.  . Wheat Dextrin (BENEFIBER) POWD Take 2 scoop by mouth daily as needed (for constipation).   . [DISCONTINUED] diazepam (VALIUM) 5 MG tablet TAKE ONE TABLET EVERY SIX HOURS IF NEEDED  . [DISCONTINUED] omeprazole (PRILOSEC) 40 MG capsule TAKE 1 CAPSULE BY MOUTH DAILY BEFORE BREAKFAST  . [DISCONTINUED] oxyCODONE-acetaminophen (PERCOCET) 10-325 MG tablet Take 1 tablet by mouth every 4 (four) hours as needed for pain.    Allergies: Patient is allergic to gabapentin (once-daily); saxenda [liraglutide -weight management]; sulfa antibiotics; trintellix [vortioxetine]; adhesive [tape]; and codeine. Past Medical History Patient  has a past medical history of Allergic rhinitis due to pollen, Asthma, Carpal tunnel syndrome of right wrist (12/22/2017), Chronic combined systolic and diastolic CHF (congestive heart failure) ([place]), Depression, Family history of polycystic kidney with negative CT (11/16/2012), Fibromyalgia, GERD (gastroesophageal reflux disease),  Hypertension, Hypothyroidism, IBS (irritable bowel syndrome), Internal hemorrhoids, Morbid obesity ([place]), NICM (nonischemic cardiomyopathy) ([place]), Osteoarthritis, PVC (premature ventricular contraction), Restless leg syndrome, Sicca ([place]) (11/22/2014), and Vertigo. Past Surgical History Patient  has a past surgical history that includes Abdominal hysterectomy; Cesarean section; Tubal ligation (1992); Cholecystectomy (10-2008); Shoulder arthroscopy w/  subacromial decompression and distal clavicle excision (Right); Eye surgery; Lumbar disc surgery (2016); Esophagogastroduodenoscopy; Colonoscopy; RIGHT/LEFT HEART CATH AND CORONARY ANGIOGRAPHY (N/A, 07/11/2016); and Spine surgery. Family History: Patient family history includes Diabetes in her father; Glaucoma in her father; Heart disease in her father; Kidney failure in her brother; Pancreatic cancer in her mother. Social History:  Patient  reports that she quit smoking about 29 years ago. Her smoking use included cigarettes. She has a 24.00 pack-year smoking history. She has never used smokeless tobacco. She reports that she does not drink alcohol or use drugs.  Review of Systems: Constitutional: negative for fever or malaise Ophthalmic: negative for photophobia, double vision or loss of vision Cardiovascular: negative for chest pain, dyspnea on exertion, or new LE swelling Respiratory: negative for SOB or persistent cough Gastrointestinal: negative for abdominal pain, change in bowel habits or melena Genitourinary: negative for dysuria or gross hematuria   Patient Care Team    Relationship Specialty Notifications Start End  Willow OraAndy, Kitiara Hintze L, MD PCP - General Family Medicine  04/20/19   Marcene Corningalldorf, Peter, MD Consulting Physician Orthopedic Surgery  02/09/15   Barnett AbuElsner, Henry, MD Consulting Physician Neurosurgery  02/09/15   Jethro Bolusannenbaum, Sigmund, MD (Inactive) Consulting Physician Urology  02/09/15   [place], Peter M, MD Consulting Physician Cardiology   02/09/15   Wanita ChamberlainBenitez-Graham, Ana M, MD  Dermatology  11/28/17   Silverio Layivard, Sandra, MD Consulting Physician Obstetrics and Gynecology  04/20/19     Objective  Vitals: BP 118/82 (BP Location: Left Arm, Patient Position: Sitting, Cuff Size: Large)   Pulse 96   Temp (!) 97.5 F (36.4 C) (Temporal)   Ht 5\' 3"  (1.6 m)   Wt 235 lb (106.6 kg)   SpO2 99%   BMI 41.63 kg/m  General:  Well developed, well nourished, no acute distress  Psych:  Alert and oriented,normal mood and affect Neurologic:    Mental status is normal. Gross motor and sensory exams are normal. Normal gait   Commons side effects, risks, benefits, and alternatives for medications and treatment plan prescribed today were discussed, and the patient expressed understanding of the given instructions. Patient is instructed to call or message via MyChart if he/she has any questions or concerns regarding our treatment plan. No barriers to understanding were identified. We discussed Red Flag symptoms and signs in detail. Patient expressed understanding regarding what to do in case of urgent or emergency type symptoms.   Medication list was reconciled, printed and provided to the patient in AVS. Patient instructions and summary information was reviewed with the patient as documented in the AVS. This note was prepared with assistance of Dragon voice recognition software. Occasional wrong-word or sound-a-like substitutions may have occurred due to the inherent limitations of voice recognition software  This visit occurred during the SARS-CoV-2 public health emergency.  Safety protocols were in place, including screening questions prior to the visit, additional usage of staff PPE, and extensive cleaning of exam room while observing appropriate contact time as indicated for disinfecting solutions.

## 2019-04-20 NOTE — Patient Instructions (Addendum)
Please return as scheduled. 07/29/2019  It was a pleasure meeting you today! Thank you for choosing [place] to meet your healthcare needs! I truly look forward to working with you. If you have any questions or concerns, please send me a message via Mychart or call the office at [phone].  I have refilled your medications. Happy Holidays!

## 2019-04-21 ENCOUNTER — Other Ambulatory Visit: Payer: Self-pay | Admitting: Family Medicine

## 2019-04-21 ENCOUNTER — Other Ambulatory Visit: Payer: Self-pay | Admitting: Physician Assistant

## 2019-04-21 DIAGNOSIS — M5441 Lumbago with sciatica, right side: Secondary | ICD-10-CM

## 2019-04-21 DIAGNOSIS — G8929 Other chronic pain: Secondary | ICD-10-CM

## 2019-05-24 ENCOUNTER — Other Ambulatory Visit: Payer: Self-pay | Admitting: Family Medicine

## 2019-06-01 ENCOUNTER — Other Ambulatory Visit (INDEPENDENT_AMBULATORY_CARE_PROVIDER_SITE_OTHER): Payer: Medicare Other

## 2019-06-01 ENCOUNTER — Encounter: Payer: Self-pay | Admitting: Family Medicine

## 2019-06-01 ENCOUNTER — Ambulatory Visit (INDEPENDENT_AMBULATORY_CARE_PROVIDER_SITE_OTHER): Payer: Medicare Other | Admitting: Family Medicine

## 2019-06-01 ENCOUNTER — Other Ambulatory Visit: Payer: Self-pay

## 2019-06-01 VITALS — Ht 63.0 in | Wt 235.0 lb

## 2019-06-01 DIAGNOSIS — E039 Hypothyroidism, unspecified: Secondary | ICD-10-CM

## 2019-06-01 DIAGNOSIS — K580 Irritable bowel syndrome with diarrhea: Secondary | ICD-10-CM | POA: Diagnosis not present

## 2019-06-01 DIAGNOSIS — R197 Diarrhea, unspecified: Secondary | ICD-10-CM

## 2019-06-01 MED ORDER — DICYCLOMINE HCL 10 MG PO CAPS: 10.0000 mg | ORAL_CAPSULE | Freq: Four times a day (QID) | ORAL | 2 refills | Status: DC | PRN

## 2019-06-01 MED ORDER — CIPROFLOXACIN HCL 500 MG PO TABS: 500.0000 mg | ORAL_TABLET | Freq: Two times a day (BID) | ORAL | 0 refills | Status: AC

## 2019-06-01 NOTE — Progress Notes (Signed)
Virtual Visit via Video Note  Subjective  CC:  Chief Complaint  Patient presents with  . Cough    Started on 05/16/2019. Covid test negative.  . Diarrhea  . Fever  . Nausea     I connected with Lovette Cliche on 06/01/19 at  2:20 PM EST by a video enabled telemedicine application and verified that I am speaking with the correct person using two identifiers. Location patient: Home Location provider:  [facility] [place], Office Persons participating in the virtual visit: ABRIANNA SIDMAN, Leamon Arnt, MD Serita Sheller, [place] discussed the limitations of evaluation and management by telemedicine and the availability of in person appointments. The patient expressed understanding and agreed to proceed. HPI: Kristine Curtis is a 59 y.o. female who was contacted today to address the problems listed above in the chief complaint. . Pt with diarrhea and fatigue; has had tmax was 99. Started over 2 weeks ago. Having a minimum of 6 stools / day. Loose, watery, w/o mucous but sticky and black. Having crampy abdominal pain. No bright red blood. No recent antibiotics. Eating and drinking ok in spite of some nausea. Diarrhea after every meal. No cp or sob but feeling very tired. She does take a magnesium supplement. Normal colonoscopy 2016 reviewed with Dr. Carlean Purl. Appetite is fair. Hydrating fine. No vomiting. Has IBS but never has had persistent diarrhea like this before. No recent travel. No sick contacts.  . Reported intermittent cough so got [facility]: it was negative. No loss of taste, smell. No myalgias. No sob.  . On multiple medications; no new meds.   Assessment  1. Diarrhea of presumed infectious origin   2. Irritable bowel syndrome with diarrhea   3. Acquired hypothyroidism      Plan   Diarrhea, persistent: Could be infectious, check stool studies, stool cultures, lab work.  Patient to come in to get sample.  Then will start  Cipro empirically.  Recommend Imodium.  Good hydration.  Follow-up in 1 week for recheck.  Further work-up if needed at that time.  Could also be IBS flare: Add Bentyl.  Hypothyroidism: Recheck levels today. I discussed the assessment and treatment plan with the patient. The patient was provided an opportunity to ask questions and all were answered. The patient agreed with the plan and demonstrated an understanding of the instructions.   The patient was advised to call back or seek an in-person evaluation if the symptoms worsen or if the condition fails to improve as anticipated. Follow up: Recheck in 1 week 07/29/2019  No orders of the defined types were placed in this encounter.     I reviewed the patients updated PMH, FH, and SocHx.    Patient Active Problem List   Diagnosis Date Noted  . OSA (obstructive sleep apnea) 11/10/2017    Priority: High  . Myofascial pain dysfunction syndrome 07/14/2017    Priority: High  . Morbid obesity ([place]) 02/24/2017    Priority: High  . Sjogren's disease ([place]) 02/24/2017    Priority: High  . Fibromyalgia 01/21/2017    Priority: High  . Chronic low back pain with sciatica 05/02/2015    Priority: High  . Chronic systolic congestive heart failure ([place]), EF 35-40% 03/22/2015    Priority: High  . Hyperlipidemia, with high triglycerides and low HDL, not on statin 11/15/2008    Priority: High  . Depression with anxiety 08/31/2008    Priority:  High  . Essential hypertension, on Losartan, Coreg, and Spironolactone 08/31/2008    Priority: High  . Hypothyroidism, on Levothyroxine 08/30/2008    Priority: High  . IBS (irritable bowel syndrome) 11/10/2017    Priority: Medium  . RLS (restless legs syndrome) 10/01/2017    Priority: Medium  . Asthma, Symbicort 08/31/2008    Priority: Medium  . GERD, on Ompeprazole 08/31/2008    Priority: Medium  . Classical migraine without intractable migraine 08/30/2008    Priority: Medium  . B12 deficiency  11/10/2017    Priority: Low  . Myalgia due to statin 11/10/2017    Priority: Low  . Degenerative arthritis of left knee, XRAY12/2018 07/13/2017    Priority: Low  . Vitamin D deficiency, on daily vitamin D 10/09/2009    Priority: Low  . Allergic rhinitis, on Flonase 08/31/2008    Priority: Low  . Lumbar disc disease 10/01/2017  . Cervical radiculopathy 07/14/2017  . Sicca (HCC) 11/22/2014   Current Meds  Medication Sig  . ASMANEX 60 METERED DOSES 220 MCG/INH inhaler INHALE 2 PUFFS INTO LUNGS EVERY DAY  . buPROPion (WELLBUTRIN XL) 300 MG 24 hr tablet TAKE 1 TABLET BY MOUTH DAILY  . carvedilol (COREG) 12.5 MG tablet Take 1 tablet (12.5 mg total) by mouth 2 (two) times daily with a meal.  . Cholecalciferol (VITAMIN D-3) 25 MCG (1000 UT) CAPS Take 1,000 Units by mouth daily.   . cyclobenzaprine (FLEXERIL) 5 MG tablet Take 1 tablet by mouth daily.  . diazepam (VALIUM) 5 MG tablet Take 1 tablet (5 mg total) by mouth daily as needed for anxiety.  . Estradiol-Progesterone 1-100 MG CAPS Take 1 capsule by mouth at bedtime.  . fluticasone (FLONASE) 50 MCG/ACT nasal spray 1 SPRAY IN EACH NOSTRIL EVERY DAY  . furosemide (LASIX) 20 MG tablet Take 1 tablet (20 mg total) by mouth daily.  . Galcanezumab-gnlm (EMGALITY) 120 MG/ML SOAJ Inject 120 mg into the skin every 30 (thirty) days.  Marland Kitchen Halobetasol Propionate (BRYHALI) 0.01 % LOTN Apply topically as needed.  . hydroxychloroquine (PLAQUENIL) 200 MG tablet Take 2 tablets (400 mg total) by mouth every morning.  . hydrOXYzine (ATARAX/VISTARIL) 25 MG tablet Take 1 tablet (25 mg total) by mouth at bedtime as needed for itching (insomnia).  Marland Kitchen levothyroxine (SYNTHROID) 100 MCG tablet TAKE ONE TABLET EVERY DAY  . losartan (COZAAR) 25 MG tablet Take 1 tablet (25 mg total) by mouth 2 (two) times daily.  . meloxicam (MOBIC) 7.5 MG tablet TAKE ONE TABLET BY MOUTH EVERY DAY  . nystatin-triamcinolone ointment (MYCOLOG) Apply 1 application topically 2 (two) times  daily.  Marland Kitchen omeprazole (PRILOSEC) 40 MG capsule Take 1 capsule (40 mg total) by mouth daily.  Marland Kitchen OVER THE COUNTER MEDICATION Take 5 capsules by mouth daily. Ariix Optimals Vitamin & Minerals   . OVER THE COUNTER MEDICATION Take 1 scoop by mouth daily. Ariix Magnecal D   . oxyCODONE-acetaminophen (PERCOCET) 10-325 MG tablet Take 1 tablet by mouth 2 (two) times daily as needed for pain.  Marland Kitchen spironolactone (ALDACTONE) 25 MG tablet TAKE ONE TABLET EVERY DAY  . topiramate (TOPAMAX) 25 MG tablet Take 1 tablet (25 mg total) by mouth 2 (two) times daily.  Marland Kitchen triamcinolone (KENALOG) 0.025 % ointment Apply 1 application topically 2 (two) times daily.  . Wheat Dextrin (BENEFIBER) POWD Take 2 scoop by mouth daily as needed (for constipation).     Allergies: Patient is allergic to gabapentin (once-daily); saxenda [liraglutide -weight management]; sulfa antibiotics; trintellix [vortioxetine]; adhesive [tape];  and codeine. Family History: Patient family history includes Diabetes in her father; Glaucoma in her father; Heart disease in her father; Kidney failure in her brother; Pancreatic cancer in her mother. Social History:  Patient  reports that she quit smoking about 30 years ago. Her smoking use included cigarettes. She has a 24.00 pack-year smoking history. She has never used smokeless tobacco. She reports that she does not drink alcohol or use drugs.  Review of Systems: Constitutional: Negative for fever malaise or anorexia Cardiovascular: negative for chest pain Respiratory: negative for SOB or persistent cough Gastrointestinal: negative for abdominal pain  OBJECTIVE Vitals: Ht 5\' 3"  (1.6 m)   Wt 235 lb (106.6 kg)   BMI 41.63 kg/m  General: no acute distress , A&Ox3  , MD

## 2019-06-01 NOTE — Patient Instructions (Signed)
Please return in 1 week.   If you have any questions or concerns, please don't hesitate to send me a message via MyChart or call the office at [phone]. Thank you for visiting with [place] today! It's our pleasure caring for you.   Diarrhea, Adult Diarrhea is frequent loose and watery bowel movements. Diarrhea can make you feel weak and cause you to become dehydrated. Dehydration can make you tired and thirsty, cause you to have a dry mouth, and decrease how often you urinate. Diarrhea typically lasts 2-3 days. However, it can last longer if it is a sign of something more serious. It is important to treat your diarrhea as told by your health care provider. Follow these instructions at home: Eating and drinking     Follow these recommendations as told by your health care provider:  Take an oral rehydration solution (ORS). This is an over-the-counter medicine that helps return your body to its normal balance of nutrients and water. It is found at pharmacies and retail stores.  Drink plenty of fluids, such as water, ice chips, diluted fruit juice, and low-calorie sports drinks. You can drink milk also, if desired.  Avoid drinking fluids that contain a lot of sugar or caffeine, such as energy drinks, sports drinks, and soda.  Eat bland, easy-to-digest foods in small amounts as you are able. These foods include bananas, applesauce, rice, lean meats, toast, and crackers.  Avoid alcohol.  Avoid spicy or fatty foods.  Medicines  Take over-the-counter and prescription medicines only as told by your health care provider.  If you were prescribed an antibiotic medicine, take it as told by your health care provider. Do not stop using the antibiotic even if you start to feel better. General instructions   Wash your hands often using soap and water. If soap and water are not available, use a hand sanitizer. Others in the household should wash their hands as well. Hands should be washed: ? After  using the toilet or changing a diaper. ? Before preparing, cooking, or serving food. ? While caring for a sick person or while visiting someone in a hospital.  Drink enough fluid to keep your urine pale yellow.  Rest at home while you recover.  Watch your condition for any changes.  Take a warm bath to relieve any burning or pain from frequent diarrhea episodes.  Keep all follow-up visits as told by your health care provider. This is important. Contact a health care provider if:  You have a fever.  Your diarrhea gets worse.  You have new symptoms.  You cannot keep fluids down.  You feel light-headed or dizzy.  You have a headache.  You have muscle cramps. Get help right away if:  You have chest pain.  You feel extremely weak or you faint.  You have bloody or black stools or stools that look like tar.  You have severe pain, cramping, or bloating in your abdomen.  You have trouble breathing or you are breathing very quickly.  Your heart is beating very quickly.  Your skin feels cold and clammy.  You feel confused.  You have signs of dehydration, such as: ? Dark urine, very little urine, or no urine. ? Cracked lips. ? Dry mouth. ? Sunken eyes. ? Sleepiness. ? Weakness. Summary  Diarrhea is frequent loose and watery bowel movements. Diarrhea can make you feel weak and cause you to become dehydrated.  Drink enough fluids to keep your urine pale yellow.  Make sure that you  wash your hands after using the toilet. If soap and water are not available, use hand sanitizer.  Contact a health care provider if your diarrhea gets worse or you have new symptoms.  Get help right away if you have signs of dehydration. This information is not intended to replace advice given to you by your health care provider. Make sure you discuss any questions you have with your health care provider. Document Revised: 09/08/2018 Document Reviewed: 09/26/2017 Elsevier Patient  Education  2020 [employer].

## 2019-06-02 ENCOUNTER — Other Ambulatory Visit: Payer: Medicare Other

## 2019-06-02 LAB — CBC WITH DIFFERENTIAL/PLATELET
Basophils Absolute: 0.1 10*3/uL (ref 0.0–0.1)
Basophils Relative: 1.3 % (ref 0.0–3.0)
Eosinophils Absolute: 0.2 10*3/uL (ref 0.0–0.7)
Eosinophils Relative: 3.8 % (ref 0.0–5.0)
HCT: 38.2 % (ref 36.0–46.0)
Hemoglobin: 12.8 g/dL (ref 12.0–15.0)
Lymphocytes Relative: 29.7 % (ref 12.0–46.0)
Lymphs Abs: 1.7 10*3/uL (ref 0.7–4.0)
MCHC: 33.5 g/dL (ref 30.0–36.0)
MCV: 95 fl (ref 78.0–100.0)
Monocytes Absolute: 0.4 10*3/uL (ref 0.1–1.0)
Monocytes Relative: 7.7 % (ref 3.0–12.0)
Neutro Abs: 3.3 10*3/uL (ref 1.4–7.7)
Neutrophils Relative %: 57.5 % (ref 43.0–77.0)
Platelets: 239 10*3/uL (ref 150.0–400.0)
RBC: 4.02 Mil/uL (ref 3.87–5.11)
RDW: 12.7 % (ref 11.5–15.5)
WBC: 5.8 10*3/uL (ref 4.0–10.5)

## 2019-06-02 LAB — TSH: TSH: 3.86 u[IU]/mL (ref 0.35–4.50)

## 2019-06-02 LAB — COMPREHENSIVE METABOLIC PANEL
ALT: 22 U/L (ref 0–35)
AST: 22 U/L (ref 0–37)
Albumin: 4 g/dL (ref 3.5–5.2)
Alkaline Phosphatase: 90 U/L (ref 39–117)
BUN: 10 mg/dL (ref 6–23)
CO2: 26 mEq/L (ref 19–32)
Calcium: 9.2 mg/dL (ref 8.4–10.5)
Chloride: 109 mEq/L (ref 96–112)
Creatinine, Ser: 0.85 mg/dL (ref 0.40–1.20)
GFR: 68.39 mL/min (ref >60.00)
Glucose, Bld: 94 mg/dL (ref 70–99)
Potassium: 3.7 mEq/L (ref 3.5–5.1)
Sodium: 141 mEq/L (ref 135–145)
Total Bilirubin: 0.3 mg/dL (ref 0.2–1.2)
Total Protein: 6 g/dL (ref 6.0–8.3)

## 2019-06-02 LAB — C.DIFFICILE TOXIN: C. Difficile Toxin A: NOT DETECTED

## 2019-06-06 LAB — STOOL CULTURE
MICRO NUMBER:: 10087315
MICRO NUMBER:: 10087316
MICRO NUMBER:: 10087317
SHIGA RESULT:: NOT DETECTED
SPECIMEN QUALITY:: ADEQUATE
SPECIMEN QUALITY:: ADEQUATE
SPECIMEN QUALITY:: ADEQUATE

## 2019-06-06 LAB — OVA AND PARASITE EXAMINATION
CONCENTRATE RESULT:: NONE SEEN
MICRO NUMBER:: 10086900
SPECIMEN QUALITY:: ADEQUATE
TRICHROME RESULT:: NONE SEEN

## 2019-06-07 ENCOUNTER — Other Ambulatory Visit: Payer: Self-pay

## 2019-06-08 ENCOUNTER — Ambulatory Visit (INDEPENDENT_AMBULATORY_CARE_PROVIDER_SITE_OTHER): Payer: Medicare Other | Admitting: Family Medicine

## 2019-06-08 ENCOUNTER — Encounter: Payer: Self-pay | Admitting: Family Medicine

## 2019-06-08 VITALS — BP 128/76 | HR 101 | Temp 98.1°F | Ht 63.0 in | Wt 239.0 lb

## 2019-06-08 DIAGNOSIS — E039 Hypothyroidism, unspecified: Secondary | ICD-10-CM | POA: Diagnosis not present

## 2019-06-08 DIAGNOSIS — K580 Irritable bowel syndrome with diarrhea: Secondary | ICD-10-CM

## 2019-06-08 DIAGNOSIS — R197 Diarrhea, unspecified: Secondary | ICD-10-CM

## 2019-06-08 DIAGNOSIS — I10 Essential (primary) hypertension: Secondary | ICD-10-CM

## 2019-06-08 LAB — HEMOCCULT GUIAC POC 1CARD (OFFICE): Fecal Occult Blood, POC: NEGATIVE

## 2019-06-08 MED ORDER — DIPHENOXYLATE-ATROPINE 2.5-0.025 MG PO TABS: 1.0000 | ORAL_TABLET | Freq: Four times a day (QID) | ORAL | 1 refills | Status: DC | PRN

## 2019-06-08 NOTE — Progress Notes (Signed)
Subjective  CC:  Chief Complaint  Patient presents with  . Diarrhea    Sx have not improved. Patient denies vomiting. She has intermittent nausea. Pt has tried Pepto-Mismol, Immodium and the antibiotic with no relief.  Dicylcomine has helped some.  . Hypothyroidism    Patient does not have any concerns with this today.    HPI: Kristine Curtis is a 59 y.o. female who presents to the office today to address the problems listed above in the chief complaint.  Persistent diarrhea: Going on for weeks now.  Please refer to last note.  Patient with persistent multiple loose bowel movements per day.  Reports she awakens every day with diarrhea.  Also will have loose stool after meals.  She describes stools as loose, dark and sticky with an overlying layer of oil.  She denies bright red blood in stool.  No obvious mucus.  Denies watery stools.  Will have diarrhea with fasting.  No fevers or chills.  Mild abdominal cramping but no localized abdominal pain.  Mild nausea but no vomiting.  Appetite is fair.  She has tried multiple over-the-counter antidiarrheals without any change in her bowel movement frequency.  She completed a course of Cipro without any change in stools.  Reviewed most recent lab work as noted below.  Blood test results are all normal.  GI stool cultures are negative.  C. difficile was negative.  She does have IBS but never has had persistent diarrhea like this.  She does have a gastroenterologist.  Last colonoscopy was normal within the last 5 years.  She does have a GERD symptoms. Assessment  1. Diarrhea, unspecified type   2. Irritable bowel syndrome with diarrhea   3. Essential hypertension, on Losartan, Coreg, and Spironolactone   4. Acquired hypothyroidism      Plan   Diarrhea, persistent: Does not seem to be infectious.  Treat with Lomotil and needs further evaluation.  Refer back to gastroenterology.  May warrant endoscopy.  Patient is hemodynamically stable.  She will  follow-up with me if fever or worsening abdominal pain occur.  Differential diagnosis includes IBS flare, inflammatory colitis, secretory or osmotic diarrhea.  Hypertension is controlled  Hypothyroidism is at goal by recent lab work.  Follow up: As scheduled 07/29/2019  Orders Placed This Encounter  Procedures  . Ambulatory referral to Gastroenterology  . POCT occult blood stool   Meds ordered this encounter  Medications  . diphenoxylate-atropine (LOMOTIL) 2.5-0.025 MG tablet    Sig: Take 1 tablet by mouth 4 (four) times daily as needed for diarrhea or loose stools.    Dispense:  30 tablet    Refill:  1      I reviewed the patients updated PMH, FH, and SocHx.    Patient Active Problem List   Diagnosis Date Noted  . OSA (obstructive sleep apnea) 11/10/2017    Priority: High  . Myofascial pain dysfunction syndrome 07/14/2017    Priority: High  . Morbid obesity (HCC) 02/24/2017    Priority: High  . Sjogren's disease (HCC) 02/24/2017    Priority: High  . Fibromyalgia 01/21/2017    Priority: High  . Chronic low back pain with sciatica 05/02/2015    Priority: High  . Chronic systolic congestive heart failure (HCC), EF 35-40% 03/22/2015    Priority: High  . Hyperlipidemia, with high triglycerides and low HDL, not on statin 11/15/2008    Priority: High  . Depression with anxiety 08/31/2008    Priority: High  . Essential  hypertension, on Losartan, Coreg, and Spironolactone 08/31/2008    Priority: High  . Hypothyroidism, on Levothyroxine 08/30/2008    Priority: High  . IBS (irritable bowel syndrome) 11/10/2017    Priority: Medium  . RLS (restless legs syndrome) 10/01/2017    Priority: Medium  . Asthma, Symbicort 08/31/2008    Priority: Medium  . GERD, on Ompeprazole 08/31/2008    Priority: Medium  . Classical migraine without intractable migraine 08/30/2008    Priority: Medium  . B12 deficiency 11/10/2017    Priority: Low  . Myalgia due to statin 11/10/2017     Priority: Low  . Degenerative arthritis of left knee, XRAY12/2018 07/13/2017    Priority: Low  . Vitamin D deficiency, on daily vitamin D 10/09/2009    Priority: Low  . Allergic rhinitis, on Flonase 08/31/2008    Priority: Low  . Lumbar disc disease 10/01/2017  . Cervical radiculopathy 07/14/2017  . Sicca ([place]) 11/22/2014   Current Meds  Medication Sig  . buPROPion (WELLBUTRIN XL) 300 MG 24 hr tablet TAKE 1 TABLET BY MOUTH DAILY  . carvedilol (COREG) 12.5 MG tablet Take 1 tablet (12.5 mg total) by mouth 2 (two) times daily with a meal.  . Cholecalciferol (VITAMIN D-3) 25 MCG (1000 UT) CAPS Take 1,000 Units by mouth daily.   . cyclobenzaprine (FLEXERIL) 5 MG tablet Take 1 tablet by mouth daily.  . diazepam (VALIUM) 5 MG tablet Take 1 tablet (5 mg total) by mouth daily as needed for anxiety.  . Estradiol-Progesterone 1-100 MG CAPS Take 1 capsule by mouth at bedtime.  . fluticasone (FLONASE) 50 MCG/ACT nasal spray 1 SPRAY IN EACH NOSTRIL EVERY DAY  . furosemide (LASIX) 20 MG tablet Take 1 tablet (20 mg total) by mouth daily.  . Galcanezumab-gnlm (EMGALITY) 120 MG/ML SOAJ Inject 120 mg into the skin every 30 (thirty) days.  . hydroxychloroquine (PLAQUENIL) 200 MG tablet Take 2 tablets (400 mg total) by mouth every morning.  . hydrOXYzine (ATARAX/VISTARIL) 25 MG tablet Take 1 tablet (25 mg total) by mouth at bedtime as needed for itching (insomnia).  Marland Kitchen levothyroxine (SYNTHROID) 100 MCG tablet TAKE ONE TABLET EVERY DAY  . losartan (COZAAR) 25 MG tablet Take 1 tablet (25 mg total) by mouth 2 (two) times daily.  . meloxicam (MOBIC) 7.5 MG tablet TAKE ONE TABLET BY MOUTH EVERY DAY  . nystatin-triamcinolone ointment (MYCOLOG) Apply 1 application topically 2 (two) times daily.  Marland Kitchen omeprazole (PRILOSEC) 40 MG capsule Take 1 capsule (40 mg total) by mouth daily.  Marland Kitchen OVER THE COUNTER MEDICATION Take 5 capsules by mouth daily. [employer] Vitamin & Minerals   . OVER THE COUNTER MEDICATION Take 1  scoop by mouth daily. [employer] Magnecal D   . oxyCODONE-acetaminophen (PERCOCET) 10-325 MG tablet Take 1 tablet by mouth 2 (two) times daily as needed for pain.  Marland Kitchen spironolactone (ALDACTONE) 25 MG tablet TAKE ONE TABLET EVERY DAY  . topiramate (TOPAMAX) 25 MG tablet Take 1 tablet (25 mg total) by mouth 2 (two) times daily.  Marland Kitchen triamcinolone (KENALOG) 0.025 % ointment Apply 1 application topically 2 (two) times daily.  . Wheat Dextrin (BENEFIBER) POWD Take 2 scoop by mouth daily as needed (for constipation).   . [DISCONTINUED] dicyclomine (BENTYL) 10 MG capsule Take 1 capsule (10 mg total) by mouth 4 (four) times daily as needed (abdominal cramps).    Allergies: Patient is allergic to gabapentin (once-daily); saxenda [liraglutide -weight management]; sulfa antibiotics; trintellix [vortioxetine]; adhesive [tape]; and codeine. Family History: Patient family history includes  Diabetes in her father; Glaucoma in her father; Heart disease in her father; Kidney failure in her brother; Pancreatic cancer in her mother. Social History:  Patient  reports that she quit smoking about 30 years ago. Her smoking use included cigarettes. She has a 24.00 pack-year smoking history. She has never used smokeless tobacco. She reports that she does not drink alcohol or use drugs.  Review of Systems: Constitutional: Negative for fever malaise or anorexia Cardiovascular: negative for chest pain Respiratory: negative for SOB or persistent cough Gastrointestinal: Positive for abdominal pain  Objective  Vitals: BP 128/76 (BP Location: Left Arm, Patient Position: Sitting, Cuff Size: Large)   Pulse (!) 101   Temp 98.1 F (36.7 C) (Temporal)   Ht 5\' 3"  (1.6 m)   Wt 239 lb (108.4 kg)   SpO2 95%   BMI 42.34 kg/m  General: no acute distress , A&Ox3, appears well HEENT: PEERL, conjunctiva normal,  Cardiovascular: Mildly tacky, chronically, RR without murmur or gallop.  Respiratory:  Good breath sounds bilaterally, CTAB  with normal respiratory effort Gastrointestinal: soft, flat abdomen, normal active bowel sounds, no palpable masses, no hepatosplenomegaly, no appreciated hernias, mild midepigastric tenderness without rebound or guarding Skin:  Warm, no rashes  Office Visit on 06/08/2019  Component Date Value Ref Range Status  . Fecal Occult Blood, POC 06/08/2019 Negative  Negative Final   Lab Results  Component Value Date   CREATININE 0.85 06/01/2019   BUN 10 06/01/2019   NA 141 06/01/2019   K 3.7 06/01/2019   CL 109 06/01/2019   CO2 26 06/01/2019   Lab Results  Component Value Date   WBC 5.8 06/01/2019   HGB 12.8 06/01/2019   HCT 38.2 06/01/2019   MCV 95.0 06/01/2019   PLT 239.0 06/01/2019   Lab Results  Component Value Date   TSH 3.86 06/01/2019   Lab Results  Component Value Date   ESRSEDRATE 2 07/14/2017   Neg stool culture; neg c.diff    Commons side effects, risks, benefits, and alternatives for medications and treatment plan prescribed today were discussed, and the patient expressed understanding of the given instructions. Patient is instructed to call or message via MyChart if he/she has any questions or concerns regarding our treatment plan. No barriers to understanding were identified. We discussed Red Flag symptoms and signs in detail. Patient expressed understanding regarding what to do in case of urgent or emergency type symptoms.   Medication list was reconciled, printed and provided to the patient in AVS. Patient instructions and summary information was reviewed with the patient as documented in the AVS. This note was prepared with assistance of Dragon voice recognition software. Occasional wrong-word or sound-a-like substitutions may have occurred due to the inherent limitations of voice recognition software  This visit occurred during the SARS-CoV-2 public health emergency.  Safety protocols were in place, including screening questions prior to the visit, additional usage  of staff PPE, and extensive cleaning of exam room while observing appropriate contact time as indicated for disinfecting solutions.

## 2019-06-08 NOTE — Patient Instructions (Signed)
We will get you scheduled to see Dr. Carlean Purl so he can further evaluate your diarrhea.  Keep hydrated.  Try the lomotil to help.   If you have any questions or concerns, please don't hesitate to send me a message via MyChart or call the office at [phone]. Thank you for visiting with [place] today! It's our pleasure caring for you.   Diarrhea, Adult Diarrhea is frequent loose and watery bowel movements. Diarrhea can make you feel weak and cause you to become dehydrated. Dehydration can make you tired and thirsty, cause you to have a dry mouth, and decrease how often you urinate. Diarrhea typically lasts 2-3 days. However, it can last longer if it is a sign of something more serious. It is important to treat your diarrhea as told by your health care provider. Follow these instructions at home: Eating and drinking     Follow these recommendations as told by your health care provider:  Take an oral rehydration solution (ORS). This is an over-the-counter medicine that helps return your body to its normal balance of nutrients and water. It is found at pharmacies and retail stores.  Drink plenty of fluids, such as water, ice chips, diluted fruit juice, and low-calorie sports drinks. You can drink milk also, if desired.  Avoid drinking fluids that contain a lot of sugar or caffeine, such as energy drinks, sports drinks, and soda.  Eat bland, easy-to-digest foods in small amounts as you are able. These foods include bananas, applesauce, rice, lean meats, toast, and crackers.  Avoid alcohol.  Avoid spicy or fatty foods.  Medicines  Take over-the-counter and prescription medicines only as told by your health care provider.  If you were prescribed an antibiotic medicine, take it as told by your health care provider. Do not stop using the antibiotic even if you start to feel better. General instructions   Wash your hands often using soap and water. If soap and water are not available, use a  hand sanitizer. Others in the household should wash their hands as well. Hands should be washed: ? After using the toilet or changing a diaper. ? Before preparing, cooking, or serving food. ? While caring for a sick person or while visiting someone in a hospital.  Drink enough fluid to keep your urine pale yellow.  Rest at home while you recover.  Watch your condition for any changes.  Take a warm bath to relieve any burning or pain from frequent diarrhea episodes.  Keep all follow-up visits as told by your health care provider. This is important. Contact a health care provider if:  You have a fever.  Your diarrhea gets worse.  You have new symptoms.  You cannot keep fluids down.  You feel light-headed or dizzy.  You have a headache.  You have muscle cramps. Get help right away if:  You have chest pain.  You feel extremely weak or you faint.  You have bloody or black stools or stools that look like tar.  You have severe pain, cramping, or bloating in your abdomen.  You have trouble breathing or you are breathing very quickly.  Your heart is beating very quickly.  Your skin feels cold and clammy.  You feel confused.  You have signs of dehydration, such as: ? Dark urine, very little urine, or no urine. ? Cracked lips. ? Dry mouth. ? Sunken eyes. ? Sleepiness. ? Weakness. Summary  Diarrhea is frequent loose and watery bowel movements. Diarrhea can make you feel weak and  cause you to become dehydrated.  Drink enough fluids to keep your urine pale yellow.  Make sure that you wash your hands after using the toilet. If soap and water are not available, use hand sanitizer.  Contact a health care provider if your diarrhea gets worse or you have new symptoms.  Get help right away if you have signs of dehydration. This information is not intended to replace advice given to you by your health care provider. Make sure you discuss any questions you have with your  health care provider. Document Revised: 09/08/2018 Document Reviewed: 09/26/2017 Elsevier Patient Education  2020 [employer].

## 2019-06-11 ENCOUNTER — Other Ambulatory Visit: Payer: Self-pay

## 2019-06-11 ENCOUNTER — Encounter: Payer: Self-pay | Admitting: Physician Assistant

## 2019-06-11 ENCOUNTER — Ambulatory Visit (HOSPITAL_BASED_OUTPATIENT_CLINIC_OR_DEPARTMENT_OTHER)
Admission: RE | Admit: 2019-06-11 | Discharge: 2019-06-11 | Disposition: A | Discharge status: Home / Self Care | Payer: Medicare Other | Source: Ambulatory Visit | Attending: Physician Assistant | Admitting: Physician Assistant

## 2019-06-11 ENCOUNTER — Other Ambulatory Visit (INDEPENDENT_AMBULATORY_CARE_PROVIDER_SITE_OTHER): Payer: Medicare Other

## 2019-06-11 ENCOUNTER — Ambulatory Visit (INDEPENDENT_AMBULATORY_CARE_PROVIDER_SITE_OTHER): Payer: Medicare Other | Admitting: Physician Assistant

## 2019-06-11 VITALS — BP 132/78 | HR 98 | Temp 97.8°F | Ht 63.0 in | Wt 238.0 lb

## 2019-06-11 DIAGNOSIS — K625 Hemorrhage of anus and rectum: Secondary | ICD-10-CM

## 2019-06-11 DIAGNOSIS — R1084 Generalized abdominal pain: Secondary | ICD-10-CM

## 2019-06-11 DIAGNOSIS — R194 Change in bowel habit: Secondary | ICD-10-CM | POA: Diagnosis not present

## 2019-06-11 LAB — COMPREHENSIVE METABOLIC PANEL
ALT: 24 U/L (ref 0–35)
AST: 22 U/L (ref 0–37)
Albumin: 4 g/dL (ref 3.5–5.2)
Alkaline Phosphatase: 90 U/L (ref 39–117)
BUN: 10 mg/dL (ref 6–23)
CO2: 25 mEq/L (ref 19–32)
Calcium: 8.6 mg/dL (ref 8.4–10.5)
Chloride: 108 mEq/L (ref 96–112)
Creatinine, Ser: 0.8 mg/dL (ref 0.40–1.20)
GFR: 73.34 mL/min (ref >60.00)
Glucose, Bld: 84 mg/dL (ref 70–99)
Potassium: 3.9 mEq/L (ref 3.5–5.1)
Sodium: 140 mEq/L (ref 135–145)
Total Bilirubin: 0.4 mg/dL (ref 0.2–1.2)
Total Protein: 6.3 g/dL (ref 6.0–8.3)

## 2019-06-11 LAB — CBC WITH DIFFERENTIAL/PLATELET
Basophils Absolute: 0 10*3/uL (ref 0.0–0.1)
Basophils Relative: 0.5 % (ref 0.0–3.0)
Eosinophils Absolute: 0.2 10*3/uL (ref 0.0–0.7)
Eosinophils Relative: 3.5 % (ref 0.0–5.0)
HCT: 38.1 % (ref 36.0–46.0)
Hemoglobin: 12.8 g/dL (ref 12.0–15.0)
Lymphocytes Relative: 32.5 % (ref 12.0–46.0)
Lymphs Abs: 1.8 10*3/uL (ref 0.7–4.0)
MCHC: 33.5 g/dL (ref 30.0–36.0)
MCV: 95.8 fl (ref 78.0–100.0)
Monocytes Absolute: 0.4 10*3/uL (ref 0.1–1.0)
Monocytes Relative: 6.7 % (ref 3.0–12.0)
Neutro Abs: 3.1 10*3/uL (ref 1.4–7.7)
Neutrophils Relative %: 56.8 % (ref 43.0–77.0)
Platelets: 229 10*3/uL (ref 150.0–400.0)
RBC: 3.98 Mil/uL (ref 3.87–5.11)
RDW: 13.2 % (ref 11.5–15.5)
WBC: 5.5 10*3/uL (ref 4.0–10.5)

## 2019-06-11 MED ORDER — IOHEXOL 300 MG/ML  SOLN
100.0000 mL | Freq: Once | INTRAMUSCULAR | Status: AC | PRN
  Administered 2019-06-11: 100 mL via INTRAVENOUS

## 2019-06-11 NOTE — Progress Notes (Signed)
Chief Complaint: Diarrhea, Abdominal pain  HPI:    Kristine Curtis is a 59 year old Caucasian female with a past medical history as listed below, known to Dr. Carlean Purl, who was referred to me by Leamon Arnt, MD for a complaint of diarrhea and abdominal pain.      02/14/2015 colonoscopy for screening was normal.  Repeat recommended in 10 years.  EGD on the same day with a ring at the GE junction dilated to 20 mm with balloon, small patch of columnar mucosa in the distal esophagus and otherwise normal.  Pathology showed no Barrett's.    06/01/2019 CBC, CMP, TSH, stool culture, ova and parasites, C. difficile, occult blood in stool negative/normal.    06/08/2019 patient saw PCP in regards to diarrhea.  At that time described persistent diarrhea which been going on for weeks.  Persistent multiple loose bowel movements per day and awakens every night with diarrhea also loose stools after meals.  Tried over-the-counter antidiarrheals without any change.  Completed a course of Cipro without any change in stools.  Stool cultures were negative.  C. difficile is negative.  Discussed history of IBS.  Prescribed Lomotil.    Today, the patient presents clinic and explains that since 05/17/2019 she has been having terrible diarrhea at most up to 14 times a day.  Tells me it feels like just "rushing water".  When she collected this in the hat for stool studies it looked like someone had poured "olive oil over it" and it was very thick and sticky and black.  (Hemoccult testing was negative) Since then ,she has been trying over-the-counter diarrheals which do not even make a dent in it.  She has not tried Lomotil yet because as of this week, starting on Monday she had a slowing of bowel movements and in fact only had 2 loose stools yesterday.  Over this past week though she has had an increasing amount of abdominal distention and pain described as a tightness/cramping rated as a 6-7/10.  This seems to get worse throughout  the day.  Has also started burping and "it smells like I pooped out my mouth".  She is still passing gas from below.  Tells me she has not taken any antidiarrheals over the past week.  Associated symptoms include weight gain and some occasional nausea.    Also describes occasional episodes of bright red blood in the toilet, the last time this occurred was November 11.  She shows me a picture and tells me that this is "only when I just sat down to urinate".    Patient wonders if it is something to do with gluten telling me that she started looking up things on the internet and also describes some rashes that she had on the front of her legs and arms a couple of years ago which "they could never figure out".    Denies fever, chills or vomiting.  Past Medical History:  Diagnosis Date  . Allergic rhinitis due to pollen   . Asthma   . Carpal tunnel syndrome of right wrist 12/22/2017  . Chronic combined systolic and diastolic CHF (congestive heart failure) ([place])    06/2016 Echo: EF 40-45%, Gr1 DD  . Depression   . Family history of polycystic kidney with negative CT 11/16/2012  . Fibromyalgia   . GERD (gastroesophageal reflux disease)   . Hypertension   . Hypothyroidism   . IBS (irritable bowel syndrome)   . Internal hemorrhoids   . Morbid obesity ([place])   .  NICM (nonischemic cardiomyopathy) (HCC)    a. 1999 Cath: nl cors;  b. EF prev as low as 35%;  c. 11/2015 Echo: Ef 40-45%;  d. 06/2016 Echo: EF 40-45%;  e. Lexiscan MV: fixed anterosepta/inferseptal defect w/o ischemia, EF 35%.  . Osteoarthritis   . PVC (premature ventricular contraction)   . Restless leg syndrome   . Sicca (HCC) 11/22/2014  . Vertigo     Past Surgical History:  Procedure Laterality Date  . ABDOMINAL HYSTERECTOMY    . CESAREAN SECTION    . CHOLECYSTECTOMY  10-2008  . COLONOSCOPY    . ESOPHAGOGASTRODUODENOSCOPY    . EYE SURGERY     2 2013 and 1981/DCR of right eye 05/2014  . LUMBAR DISC SURGERY  2016   L3-4  . RIGHT/LEFT  HEART CATH AND CORONARY ANGIOGRAPHY N/A 07/11/2016   Procedure: Right/Left Heart Cath and Coronary Angiography;  Surgeon: Peter M Swaziland, MD;  Location: [facility] INVASIVE CV LAB;  Service: Cardiovascular;  Laterality: N/A;  . SHOULDER ARTHROSCOPY W/ SUBACROMIAL DECOMPRESSION AND DISTAL CLAVICLE EXCISION Right   . SPINE SURGERY     C6-C7, 03/2017  . TUBAL LIGATION  1992    Current Outpatient Medications  Medication Sig Dispense Refill  . ASMANEX 60 METERED DOSES 220 MCG/INH inhaler INHALE 2 PUFFS INTO LUNGS EVERY DAY (Patient not taking: Reported on 06/08/2019) 1 Inhaler 6  . buPROPion (WELLBUTRIN XL) 300 MG 24 hr tablet TAKE 1 TABLET BY MOUTH DAILY 30 tablet 11  . carvedilol (COREG) 12.5 MG tablet Take 1 tablet (12.5 mg total) by mouth 2 (two) times daily with a meal. 180 tablet 3  . Cholecalciferol (VITAMIN D-3) 25 MCG (1000 UT) CAPS Take 1,000 Units by mouth daily.     . cyclobenzaprine (FLEXERIL) 5 MG tablet Take 1 tablet by mouth daily.    . diazepam (VALIUM) 5 MG tablet Take 1 tablet (5 mg total) by mouth daily as needed for anxiety. 30 tablet 2  . diphenoxylate-atropine (LOMOTIL) 2.5-0.025 MG tablet Take 1 tablet by mouth 4 (four) times daily as needed for diarrhea or loose stools. 30 tablet 1  . Estradiol-Progesterone 1-100 MG CAPS Take 1 capsule by mouth at bedtime.    . fluticasone (FLONASE) 50 MCG/ACT nasal spray 1 SPRAY IN EACH NOSTRIL EVERY DAY 16 g 3  . furosemide (LASIX) 20 MG tablet Take 1 tablet (20 mg total) by mouth daily. 90 tablet 3  . Galcanezumab-gnlm (EMGALITY) 120 MG/ML SOAJ Inject 120 mg into the skin every 30 (thirty) days.    . hydroxychloroquine (PLAQUENIL) 200 MG tablet Take 2 tablets (400 mg total) by mouth every morning. 90 tablet 2  . hydrOXYzine (ATARAX/VISTARIL) 25 MG tablet Take 1 tablet (25 mg total) by mouth at bedtime as needed for itching (insomnia). 180 tablet 1  . levothyroxine (SYNTHROID) 100 MCG tablet TAKE ONE TABLET EVERY DAY 90 tablet 3  . losartan (COZAAR)  25 MG tablet Take 1 tablet (25 mg total) by mouth 2 (two) times daily. 180 tablet 3  . meloxicam (MOBIC) 7.5 MG tablet TAKE ONE TABLET BY MOUTH EVERY DAY 90 tablet 0  . nystatin-triamcinolone ointment (MYCOLOG) Apply 1 application topically 2 (two) times daily. 30 g 5  . omeprazole (PRILOSEC) 40 MG capsule Take 1 capsule (40 mg total) by mouth daily. 90 capsule 3  . OVER THE COUNTER MEDICATION Take 5 capsules by mouth daily. Ariix Optimals Vitamin & Minerals     . OVER THE COUNTER MEDICATION Take 1 scoop by mouth daily.  Ariix Magnecal D     . oxyCODONE-acetaminophen (PERCOCET) 10-325 MG tablet Take 1 tablet by mouth 2 (two) times daily as needed for pain. 30 tablet 0  . spironolactone (ALDACTONE) 25 MG tablet TAKE ONE TABLET EVERY DAY 90 tablet 3  . topiramate (TOPAMAX) 25 MG tablet Take 1 tablet (25 mg total) by mouth 2 (two) times daily. 60 tablet 3  . triamcinolone (KENALOG) 0.025 % ointment Apply 1 application topically 2 (two) times daily. 454 g 0  . Wheat Dextrin (BENEFIBER) POWD Take 2 scoop by mouth daily as needed (for constipation).      No current facility-administered medications for this visit.    Allergies as of 06/11/2019 - Review Complete 06/08/2019  Allergen Reaction Noted  . Gabapentin (once-daily)  11/21/2014  . Saxenda [liraglutide -weight management] Other (See Comments) 08/11/2017  . Sulfa antibiotics  01/14/2017  . Trintellix [vortioxetine] Other (See Comments) 08/11/2017  . Adhesive [tape] Rash 07/09/2016  . Codeine Itching 08/30/2008    Family History  Problem Relation Age of Onset  . Pancreatic cancer Mother   . Diabetes Father   . Heart disease Father   . Glaucoma Father   . Kidney failure Brother     Social History   Socioeconomic History  . Marital status: Married    Spouse name: Not on file  . Number of children: 2  . Years of education: Not on file  . Highest education level: Not on file  Occupational History  . Occupation: Engineer, agricultural  Tobacco Use  . Smoking status: Former Smoker    Packs/day: 2.00    Years: 12.00    Pack years: 24.00    Types: Cigarettes    Quit date: 05/06/1989    Years since quitting: 30.1  . Smokeless tobacco: Never Used  . Tobacco comment: quit smoking 1990  Substance and Sexual Activity  . Alcohol use: No    Alcohol/week: 0.0 standard drinks  . Drug use: No  . Sexual activity: Not on file  Other Topics Concern  . Not on file  Social History Narrative   Former Dr. Andrey Campanile then Dr. 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 Difficulty of Paying Living Expenses: Not on file  Food Insecurity:   . Worried About Programme researcher, broadcasting/film/video in the Last Year: Not on file  . Ran Out of Food in the Last Year: Not on file  Transportation Needs:   . Lack of Transportation (Medical): Not on file  . Lack of Transportation (Non-Medical): Not on file  Physical Activity:   . Days of Exercise per Week: Not on file  . Minutes of Exercise per Session: Not on file  Stress:   . Feeling of Stress : Not on file  Social Connections:   . Frequency of Communication with Friends and Family: Not on file  . Frequency of Social Gatherings with Friends and Family: Not on file  . Attends Religious Services: Not on file  . Active Member of Clubs or Organizations: Not on file  . Attends Banker Meetings: Not on file  . Marital Status: Not on file  Intimate Partner Violence:   . Fear of Current or Ex-Partner: Not on file  . Emotionally Abused: Not on file  . Physically Abused: Not on file  . Sexually Abused: Not on file    Review of Systems:    Constitutional: No weight  loss, fever or chills Skin: No rash Cardiovascular: No chest pain Respiratory: No SOB Gastrointestinal: See HPI and otherwise negative Genitourinary: No dysuria  Neurological: No headache, dizziness or syncope Musculoskeletal: No new muscle or joint  pain Hematologic: No bruising Psychiatric: No history of depression or anxiety   Physical Exam:  Vital signs: BP 132/78   Pulse 98   Temp 97.8 F (36.6 C)   Ht 5\' 3"  (1.6 m)   Wt 238 lb (108 kg)   BMI 42.16 kg/m   Constitutional:   Pleasant overweight Caucasian female appears to be in NAD, Well developed, Well nourished, alert and cooperative Head:  Normocephalic and atraumatic. Eyes:   PEERL, EOMI. No icterus. Conjunctiva pink. Ears:  Normal auditory acuity. Neck:  Supple Throat: Oral cavity and pharynx without inflammation, swelling or lesion.  Respiratory: Respirations even and unlabored. Lungs clear to auscultation bilaterally.   No wheezes, crackles, or rhonchi.  Cardiovascular: Normal S1, S2. No MRG. Regular rate and rhythm. No peripheral edema, cyanosis or pallor.  Gastrointestinal:  Soft, nondistended, MARKED ttp on both side of abdomen with involuntary guarding and wincing, decreased BS all 4 quadrants, No appreciable masses or hepatomegaly. Rectal:  Not performed.  Msk:  Symmetrical without gross deformities. Without edema, no deformity or joint abnormality.  Neurologic:  Alert and  oriented x4;  grossly normal neurologically.  Skin:   Dry and intact without significant lesions or rashes. Psychiatric: Demonstrates good judgement and reason without abnormal affect or behaviors.  RELEVANT LABS AND IMAGING: CBC    Component Value Date/Time   WBC 5.8 06/01/2019 1526   RBC 4.02 06/01/2019 1526   HGB 12.8 06/01/2019 1526   HGB 12.6 07/10/2016 0801   HCT 38.2 06/01/2019 1526   HCT 36.8 07/10/2016 0801   PLT 239.0 06/01/2019 1526   PLT 271 07/10/2016 0801   MCV 95.0 06/01/2019 1526   MCV 90 07/10/2016 0801   MCH 30.7 01/15/2017 0625   MCHC 33.5 06/01/2019 1526   RDW 12.7 06/01/2019 1526   RDW 12.7 07/10/2016 0801   LYMPHSABS 1.7 06/01/2019 1526   MONOABS 0.4 06/01/2019 1526   EOSABS 0.2 06/01/2019 1526   BASOSABS 0.1 06/01/2019 1526    CMP     Component Value  Date/Time   NA 141 06/01/2019 1526   NA 142 08/02/2016 0814   K 3.7 06/01/2019 1526   CL 109 06/01/2019 1526   CO2 26 06/01/2019 1526   GLUCOSE 94 06/01/2019 1526   BUN 10 06/01/2019 1526   BUN 15 08/02/2016 0814   CREATININE 0.85 06/01/2019 1526   CREATININE 0.78 08/18/2015 1045   CALCIUM 9.2 06/01/2019 1526   PROT 6.0 06/01/2019 1526   ALBUMIN 4.0 06/01/2019 1526   AST 22 06/01/2019 1526   ALT 22 06/01/2019 1526   ALKPHOS 90 06/01/2019 1526   BILITOT 0.3 06/01/2019 1526   GFRNONAA >60 01/15/2017 0625   GFRAA >60 01/15/2017 0625    Assessment: 1.  Marked generalized abdominal pain 2.  Change in bowel habits: Diarrhea for the past month with 14 stools a day, stool studies including culture, Hemoccult, C. difficile and O&P negative, over the past week has been slowing now with just 2/day with increasing abdominal distention and pain; concern for colitis versus obstruction versus there including bile salt diarrhea and IBS-D 3.  Rectal bleeding: Occasional per the patient, the last picture she shows me was just with urination, ? If this is rectal bleeding versus urinary?  Plan: 1.  Discussed with the  patient that due to her marked pain at time of exam today with wincing and involuntary guarding out of proportion to her history, recommend that we proceed with a CT abdomen pelvis with contrast for further evaluation.  This was ordered stat today. 2.  We will repeat labs due to a change in her bowel habits recently ending abdominal pain.  These include CBC and CMP. 3.  If above studies and imaging are negative/normal then would recommend patient proceed with a colonoscopy.  We did briefly discussed this today.  This would need to be scheduled with Dr. Leone Payor in the [facility]. 4.  Patient to follow in clinic for recommendations after labs and imaging above.  Hyacinth Meeker, PA-C  Gastroenterology 06/11/2019, 10:58 AM  Cc: Willow Ora, MD

## 2019-06-11 NOTE — Patient Instructions (Addendum)
If you are age 60 or older, your body mass index should be between 23-30. Your Body mass index is 42.16 kg/m. If this is out of the aforementioned range listed, please consider follow up with your Primary Care Provider.  If you are age 74 or younger, your body mass index should be between 19-25. Your Body mass index is 42.16 kg/m. If this is out of the aformentioned range listed, please consider follow up with your Primary Care Provider.   Your provider has requested that you go to the basement level for lab work before leaving today. Press "B" on the elevator. The lab is located at the first door on the left as you exit the elevator.   You have been scheduled for a CT scan of the abdomen and pelvis at [facility]  [address], [place], [place] [address] - now

## 2019-06-14 ENCOUNTER — Encounter (INDEPENDENT_AMBULATORY_CARE_PROVIDER_SITE_OTHER): Payer: Self-pay

## 2019-06-16 ENCOUNTER — Telehealth: Payer: Self-pay

## 2019-06-16 NOTE — Telephone Encounter (Signed)
   [facility] Pre-operative Risk Assessment    Request for surgical clearance:  1. What type of surgery is being performed? Gum graft - single tooth  2. When is this surgery scheduled? TBD   3. What type of clearance is required (medical clearance vs. Pharmacy clearance to hold med vs. Both)? pharmacy  4. Are there any medications that need to be held prior to surgery and how long? Wants to know if there are any contraindications to using 2% xylo. with 1:50,000 Epi  5. Practice name and name of physician performing surgery? [facility]ell Blanks. Mackler, DDS Daisy Lazar Lutins, [place] Benitez, DDS Practice limited to peridontics/implants  6. What is your office phone number [phone]   7.   What is your office fax number [phone]  8.   Anesthesia type (None, local, MAC, general) ? 2% xylo. with 1:50,000 Epi   Sherrie Mustache 06/16/2019, 3:47 PM  _________________________________________________________________   (provider comments below)

## 2019-06-17 NOTE — Telephone Encounter (Signed)
   Primary Cardiologist: Peter Swaziland, MD  Chart reviewed as part of pre-operative protocol coverage. Given past medical history and time since last visit, based on ACC/AHA guidelines, Kristine Curtis would be at acceptable risk for the planned procedure without further cardiovascular testing.   Her RCRI is a class II risk, 0.9% risk of major cardiac event.  She is not on any anticoagulation or blood thinning medications.  No medications need to be held prior to her gum graft.  I will route this recommendation to the requesting party via Epic fax function and remove from pre-op pool.  Please call with questions.  Thomasene Ripple. Murad Staples NP-C     [facility] [address] Office [phone] Fax [phone]

## 2019-06-21 ENCOUNTER — Other Ambulatory Visit: Payer: Self-pay

## 2019-06-21 ENCOUNTER — Encounter (INDEPENDENT_AMBULATORY_CARE_PROVIDER_SITE_OTHER): Payer: Self-pay | Admitting: Bariatrics

## 2019-06-21 ENCOUNTER — Ambulatory Visit (INDEPENDENT_AMBULATORY_CARE_PROVIDER_SITE_OTHER): Payer: Medicare Other | Admitting: Bariatrics

## 2019-06-21 VITALS — BP 131/75 | HR 89 | Temp 98.5°F | Ht 63.0 in | Wt 226.0 lb

## 2019-06-21 DIAGNOSIS — Z0289 Encounter for other administrative examinations: Secondary | ICD-10-CM

## 2019-06-21 DIAGNOSIS — I509 Heart failure, unspecified: Secondary | ICD-10-CM | POA: Diagnosis not present

## 2019-06-21 DIAGNOSIS — I10 Essential (primary) hypertension: Secondary | ICD-10-CM

## 2019-06-21 DIAGNOSIS — F3289 Other specified depressive episodes: Secondary | ICD-10-CM

## 2019-06-21 DIAGNOSIS — E538 Deficiency of other specified B group vitamins: Secondary | ICD-10-CM

## 2019-06-21 DIAGNOSIS — E039 Hypothyroidism, unspecified: Secondary | ICD-10-CM

## 2019-06-21 DIAGNOSIS — Z6841 Body Mass Index (BMI) 40.0 and over, adult: Secondary | ICD-10-CM

## 2019-06-21 DIAGNOSIS — E559 Vitamin D deficiency, unspecified: Secondary | ICD-10-CM

## 2019-06-21 DIAGNOSIS — R0602 Shortness of breath: Secondary | ICD-10-CM | POA: Diagnosis not present

## 2019-06-21 DIAGNOSIS — R5383 Other fatigue: Secondary | ICD-10-CM | POA: Diagnosis not present

## 2019-06-21 DIAGNOSIS — K219 Gastro-esophageal reflux disease without esophagitis: Secondary | ICD-10-CM

## 2019-06-21 NOTE — Progress Notes (Addendum)
Chief Complaint:   OBESITY Kristine Curtis (MR# [id number]) is a 59 y.o. female who presents for evaluation and treatment of obesity and related comorbidities. Current BMI is Body mass index is 40.03 kg/m.Marland Kitchen Darling has been struggling with her weight for many years and has been unsuccessful in either losing weight, maintaining weight loss, or reaching her healthy weight goal.  Adalie is currently in the action stage of change and ready to dedicate time achieving and maintaining a healthier weight. Lewis is interested in becoming our patient and working on intensive lifestyle modifications including (but not limited to) diet and exercise for weight loss.  Albertina eats outside the home 10-14 times a week. She does like to cook, but notes obstacles as being undisciplined and lack of planning.  Essance's habits were reviewed today and are as follows: Her family eats meals together occasionally, she thinks her family will eat healthier with her, her desired weight loss is 66 lbs, she has been heavy since being 59 years old, her heaviest weight ever was 250 pounds, she craves cola, sweet/salty snacks, she is frequently drinking liquids with calories and she has binge eating behaviors.  Depression Screen Tonique's Food and Mood (modified PHQ-9) score was 10.  Depression screen PHQ 2/9 06/21/2019  Decreased Interest 2  Down, Depressed, Hopeless 2  PHQ - 2 Score 4  Altered sleeping 2  Tired, decreased energy 3  Change in appetite 1  Feeling bad or failure about yourself  1  Trouble concentrating 0  Moving slowly or fidgety/restless 0  Suicidal thoughts 0  PHQ-9 Score 11  Difficult doing work/chores Somewhat difficult  Some recent data might be hidden   Subjective:   Other fatigue. Noorah denies daytime somnolence and admits to waking up still tired. Patent has a history of symptoms of Epworth sleepiness scale. Donnice generally gets 9-10 hours of sleep per night, and states that she does not  sleep well most nights. Snoring is not present. Apneic episodes are not present. Epworth Sleepiness Score is 10.  SOB (shortness of breath) on exertion. [place] notes increasing shortness of breath with certain activities and seems to be worsening over time with weight gain. She notes getting out of breath sooner with activity than she used to. This has gotten worse recently. Faiga denies shortness of breath at rest or orthopnea.  Essential hypertension. Blood pressure is reasonably well controlled.  BP Readings from Last 3 Encounters:  06/21/19 131/75  06/11/19 132/78  06/08/19 128/76   Lab Results  Component Value Date   CREATININE 0.80 06/11/2019   CREATININE 0.85 06/01/2019   CREATININE 0.86 02/03/2019   Hypothyroidism, unspecified type. Keirra is taking Synthroid. This is well controlled.  Lab Results  Component Value Date   TSH 3.86 06/01/2019   Congestive heart failure, unspecified HF chronicity, unspecified heart failure type ([place]). Shalene has cardiomyopathy with an EF of 35-40%. She is taking spironolactone, Coreg, Lasix, and Cozaar.  Gastroesophageal reflux disease without esophagitis. Ayelen takes Prilosec. GERD is under good control.  Vitamin D deficiency. Sabriah is taking Vitamin D over-the-counter.  B12 deficiency. Nakeeta is not on B12 supplementation.  Other depression, with emotional eating. Delvina is struggling with emotional eating and using food for comfort to the extent that it is negatively impacting her health. She has been working on behavior modification techniques to help reduce her emotional eating and has been somewhat successful. She shows no sign of suicidal or homicidal ideations. Shamyah reports impulse eating and yo-yo eating.  Assessment/Plan:   Other fatigue. [place] does feel that her weight is causing her energy to be lower than it should be. Fatigue may be related to obesity, depression or many other causes. Labs will be ordered, and in the  meanwhile, Anber will focus on self care including making healthy food choices, increasing physical activity and focusing on stress reduction. EKG 12-Lead ordered.IC done  SOB (shortness of breath) on exertion. Alyn does feel that she gets out of breath more easily that she used to when she exercises. Keiara's shortness of breath appears to be obesity related and exercise induced. She has agreed to work on weight loss and gradually increase exercise to treat her exercise induced shortness of breath. Will continue to monitor closely. Hemoglobin A1c, Insulin, random labs ordered.IC done  Essential hypertension. Danya is working on healthy weight loss and exercise to improve blood pressure control. We will watch for signs of hypotension as she continues her lifestyle modifications. Lydie will continue her medications as prescribed. Hemoglobin A1c, Insulin, random labs ordered.  Hypothyroidism, unspecified type. Patient with long-standing hypothyroidism, on levothyroxine therapy. She appears euthyroid. Orders and follow up as documented in patient record. Marcianna will continue her medication as prescribed.  Counseling . Good thyroid control is important for overall health. Supratherapeutic thyroid levels are dangerous and will not improve weight loss results. . The correct way to take levothyroxine is fasting, with water, separated by at least 30 minutes from breakfast, and separated by more than 4 hours from calcium, iron, multivitamins, acid reflux medications (PPIs).   Congestive heart failure, unspecified HF chronicity, unspecified heart failure type (HCC). Yalissa sees Cardiology every year for follow-up.  Gastroesophageal reflux disease without esophagitis. Intensive lifestyle modifications are the first line treatment for this issue. We discussed several lifestyle modifications today and she will continue to work on diet, exercise and weight loss efforts. Orders and follow up as documented in  patient record. Dalynn will continue her medication as prescribed.  Counseling . If a person has gastroesophageal reflux disease (GERD), food and stomach acid move back up into the esophagus and cause symptoms or problems such as damage to the esophagus. . Anti-reflux measures include: raising the head of the bed, avoiding tight clothing or belts, avoiding eating late at night, not lying down shortly after mealtime, and achieving weight loss. . Avoid ASA, NSAID's, caffeine, alcohol, and tobacco.  . OTC Pepcid and/or Tums are often very helpful for as needed use.  Marland Kitchen However, for persisting chronic or daily symptoms, stronger medications like Omeprazole may be needed. . You may need to avoid foods and drinks such as: ? Coffee and tea (with or without caffeine). ? Drinks that contain alcohol. ? Energy drinks and sports drinks. ? Bubbly (carbonated) drinks or sodas. ? Chocolate and cocoa. ? Peppermint and mint flavorings. ? Garlic and onions. ? Horseradish. ? Spicy and acidic foods. These include peppers, chili powder, curry powder, vinegar, hot sauces, and BBQ sauce. ? Citrus fruit juices and citrus fruits, such as oranges, lemons, and limes. ? Tomato-based foods. These include red sauce, chili, salsa, and pizza with red sauce. ? Fried and fatty foods. These include donuts, french fries, potato chips, and high-fat dressings. ? High-fat meats. These include hot dogs, rib eye steak, sausage, ham, and bacon.  Vitamin D deficiency. Low Vitamin D level contributes to fatigue and are associated with obesity, breast, and colon cancer. VITAMIN D 25 Hydroxy (Vit-D Deficiency, Fractures) level ordered.  B12 deficiency. The diagnosis was reviewed with the  patient. Counseling provided today, see below. We will continue to monitor. Orders and follow up as documented in patient record. Vitamin B12 level ordered.  Counseling . The body needs vitamin B12: to make red blood cells; to make DNA; and to help  the nerves work properly so they can carry messages from the brain to the body.  . The main causes of vitamin B12 deficiency include dietary deficiency, digestive diseases, pernicious anemia, and having a surgery in which part of the stomach or small intestine is removed.  . Certain medicines can make it harder for the body to absorb vitamin B12. These medicines include: heartburn medications; some antibiotics; some medications used to treat diabetes, gout, and high cholesterol.  . In some cases, there are no symptoms of this condition. If the condition leads to anemia or nerve damage, various symptoms can occur, such as weakness or fatigue, shortness of breath, and numbness or tingling in your hands and feet.   . Treatment:  o May include taking vitamin B12 supplements.  o Avoid alcohol.  o Eat lots of healthy foods that contain vitamin B12: - Beef, pork, chicken, [place], and organ meats, such as liver.  - Seafood: This includes clams, rainbow trout, salmon, tuna, and haddock.  - Eggs.  - Cereal and dairy products that are fortified: This means that vitamin B12 has been added to the food.      Other depression, with emotional eating. Behavior modification techniques were discussed today to help Debbera deal with her emotional/non-hunger eating behaviors.  Orders and follow up as documented in patient record. Analya will be referred to Dr. Dewaine Conger, our bariatric psychologist, for evaluation.  Class 3 severe obesity with serious comorbidity and body mass index (BMI) of 40.0 to 44.9 in adult, unspecified obesity type (HCC).  Takara is currently in the action stage of change and her goal is to continue with weight loss efforts. I recommend Afrika begin the structured treatment plan as follows:  She has agreed to the Category 3 Plan.  She will stop sugary drinks and work on meal planning.  We reviewed labs from 06/11/2019 with the patient including CMP, CBC, and glucose.  Exercise goals: All  adults should avoid inactivity. Some physical activity is better than none, and adults who participate in any amount of physical activity gain some health benefits.   Behavioral modification strategies: increasing lean protein intake, decreasing simple carbohydrates, increasing vegetables, increasing water intake, decreasing eating out, no skipping meals, meal planning and cooking strategies, keeping healthy foods in the home and planning for success.  She was informed of the importance of frequent follow-up visits to maximize her success with intensive lifestyle modifications for her multiple health conditions. She was informed we would discuss her lab results at her next visit unless there is a critical issue that needs to be addressed sooner. Shilynn agreed to keep her next visit at the agreed upon time to discuss these results.  Objective:   Blood pressure 131/75, pulse 89, temperature 98.5 F (36.9 C), height 5\' 3"  (1.6 m), weight 226 lb (102.5 kg), SpO2 98 %. Body mass index is 40.03 kg/m.  EKG: Normal sinus rhythm, rate 93 BPM. Within normal limits.  Indirect Calorimeter completed today shows a VO2 of 279 and a REE of 1941.  Her calculated basal metabolic rate is thus her basal metabolic rate is better than expected.  General: Cooperative, alert, well developed, in no acute distress. HEENT: Conjunctivae and lids unremarkable. Cardiovascular: Regular rhythm.  Lungs:  Normal work of breathing. Neurologic: No focal deficits.   Lab Results  Component Value Date   CREATININE 0.80 06/11/2019   BUN 10 06/11/2019   NA 140 06/11/2019   K 3.9 06/11/2019   CL 108 06/11/2019   CO2 25 06/11/2019   Lab Results  Component Value Date   ALT 24 06/11/2019   AST 22 06/11/2019   ALKPHOS 90 06/11/2019   BILITOT 0.4 06/11/2019   Lab Results  Component Value Date   HGBA1C 4.8 02/03/2019   HGBA1C 4.9 03/20/2016   HGBA1C 5.2 11/21/2014   No results found for: INSULIN Lab Results    Component Value Date   TSH 3.86 06/01/2019   Lab Results  Component Value Date   CHOL 170 02/03/2019   HDL 50.20 02/03/2019   LDLCALC 93 02/03/2019   LDLDIRECT 75.0 03/20/2016   TRIG 132.0 02/03/2019   CHOLHDL 3 02/03/2019   Lab Results  Component Value Date   WBC 5.5 06/11/2019   HGB 12.8 06/11/2019   HCT 38.1 06/11/2019   MCV 95.8 06/11/2019   PLT 229.0 06/11/2019   Lab Results  Component Value Date   IRON 41 (L) 11/17/2008   FERRITIN 25.1 11/17/2008   Obesity Behavioral Intervention Visit Documentation for Insurance:   Approximately 15 minutes were spent on the discussion below.  ASK: We discussed the diagnosis of obesity with Rinaldo Cloud today and Jo-Ann agreed to give [place] permission to discuss obesity behavioral modification therapy today.  ASSESS: Rosabella has the diagnosis of obesity and her BMI today is 40.1. Chaselynn is in the action stage of change.   ADVISE: Jewel was educated on the multiple health risks of obesity as well as the benefit of weight loss to improve her health. She was advised of the need for long term treatment and the importance of lifestyle modifications to improve her current health and to decrease her risk of future health problems.  AGREE: Multiple dietary modification options and treatment options were discussed and Suzannah agreed to follow the recommendations documented in the above note.  ARRANGE: Kagan was educated on the importance of frequent visits to treat obesity as outlined per CMS and USPSTF guidelines and agreed to schedule her next follow up appointment today.  Attestation Statements:   Reviewed by clinician on day of visit: allergies, medications, problem list, medical history, surgical history, family history, social history, and previous encounter notes.  Fernanda Drum, am acting as Energy manager for [employer], DO   I have reviewed the above documentation for accuracy and completeness, and I agree with the above. Corinna Capra, DO
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
## 2019-06-22 ENCOUNTER — Telehealth: Payer: Self-pay

## 2019-06-22 ENCOUNTER — Ambulatory Visit (INDEPENDENT_AMBULATORY_CARE_PROVIDER_SITE_OTHER): Payer: Medicare Other | Admitting: Psychology

## 2019-06-22 ENCOUNTER — Encounter (INDEPENDENT_AMBULATORY_CARE_PROVIDER_SITE_OTHER): Payer: Self-pay | Admitting: Bariatrics

## 2019-06-22 ENCOUNTER — Other Ambulatory Visit: Payer: Self-pay | Admitting: Family Medicine

## 2019-06-22 DIAGNOSIS — F3289 Other specified depressive episodes: Secondary | ICD-10-CM | POA: Diagnosis not present

## 2019-06-22 DIAGNOSIS — G8929 Other chronic pain: Secondary | ICD-10-CM

## 2019-06-22 DIAGNOSIS — F5089 Other specified eating disorder: Secondary | ICD-10-CM

## 2019-06-22 LAB — HEMOGLOBIN A1C
Est. average glucose Bld gHb Est-mCnc: 91 mg/dL
Hgb A1c MFr Bld: 4.8 % (ref 4.8–5.6)

## 2019-06-22 LAB — INSULIN, RANDOM: INSULIN: 31.4 u[IU]/mL — ABNORMAL HIGH (ref 2.6–24.9)

## 2019-06-22 LAB — VITAMIN D 25 HYDROXY (VIT D DEFICIENCY, FRACTURES): Vit D, 25-Hydroxy: 49.7 ng/mL (ref 30.0–100.0)

## 2019-06-22 LAB — VITAMIN B12: Vitamin B-12: 1150 pg/mL (ref 232–1245)

## 2019-06-22 NOTE — Telephone Encounter (Signed)
Let's see if we can get her colonoscopy moved up to within the next week or so- if not with Dr. Leone Payor- than whoever has availability. Most likely hemorrhoids, but would like to get it evaluated.  Thanks-JLL

## 2019-06-22 NOTE — Telephone Encounter (Signed)
Moved patient's colonoscopy up to 06/29/19 secondary to rectal bleeding. Patient to arrive at 2:30pm. COVID testing on 06/25/19 at 12:50pm. Previsit on 06/23/19 patient to arrive at 3:45pm. Called pre-visit and let them know since it is tomorrow.

## 2019-06-22 NOTE — Telephone Encounter (Signed)
Called patient to get more information. She states she is having bright red blood in the toilet and when she wipes. Says this has been going on since 06/18/19. Denies any other symptoms of SOB, dizziness, or fever.

## 2019-06-23 ENCOUNTER — Encounter (INDEPENDENT_AMBULATORY_CARE_PROVIDER_SITE_OTHER): Payer: Self-pay | Admitting: Bariatrics

## 2019-06-23 ENCOUNTER — Other Ambulatory Visit: Payer: Self-pay

## 2019-06-23 ENCOUNTER — Telehealth (INDEPENDENT_AMBULATORY_CARE_PROVIDER_SITE_OTHER): Payer: Self-pay | Admitting: Psychology

## 2019-06-23 ENCOUNTER — Ambulatory Visit (AMBULATORY_SURGERY_CENTER): Payer: Medicare Other | Admitting: *Deleted

## 2019-06-23 VITALS — Temp 97.3°F | Ht 63.0 in | Wt 236.0 lb

## 2019-06-23 DIAGNOSIS — Z01818 Encounter for other preprocedural examination: Secondary | ICD-10-CM

## 2019-06-23 DIAGNOSIS — K625 Hemorrhage of anus and rectum: Secondary | ICD-10-CM

## 2019-06-23 MED ORDER — NA SULFATE-K SULFATE-MG SULF 17.5-3.13-1.6 GM/177ML PO SOLN: ORAL | 0 refills | Status: DC

## 2019-06-23 NOTE — Progress Notes (Unsigned)
Office: [phone]  /  Fax: [phone]    Date: July 07, 2019   Appointment Start Time: *** Duration: *** minutes Provider: Lawerance Cruel, Psy.D. Type of Session: Individual Therapy  Location of Patient: {gbptloc:23249} Location of Provider: {Location of Service:22491} Type of Contact: Telepsychological Visit via {gbtelepsych:23399}  Session Content: Kristine Curtis is a 59 y.o. female presenting via {gbtelepsych:23399} for a follow-up appointment to address the previously established treatment goal of decreasing emotional eating. Today's appointment was a telepsychological visit due to COVID-19. Kristine Curtis provided verbal consent for today's telepsychological appointment and she is aware she is responsible for securing confidentiality on her end of the session. Prior to proceeding with today's appointment, Kristine Curtis's physical location at the time of this appointment was obtained as well a phone number she could be reached at in the event of technical difficulties. Kristine Curtis and this provider participated in today's telepsychological service.   This provider conducted a brief check-in and verbally administered the PHQ-9 and GAD-7. *** Kristine Curtis was receptive to today's appointment as evidenced by openness to sharing, responsiveness to feedback, and {gbreceptiveness:23401}.  Mental Status Examination:  Appearance: {Appearance:22431} Behavior: {Behavior:22445} Mood: {gbmood:21757} Affect: {Affect:22436} Speech: {Speech:22432} Eye Contact: {Eye Contact:22433} Psychomotor Activity: {Motor Activity:22434} Gait: {gbgait:23404} Thought Process: {thought process:22448}  Thought Content/Perception: {disturbances:22451} Orientation: {Orientation:22437} Memory/Concentration: {gbcognition:22449} Insight/Judgment: {Insight:22446}  Structured Assessments Results: The Patient Health Questionnaire-9 (PHQ-9) is a self-report measure that assesses symptoms and severity of depression over the course of the last two  weeks. Kristine Curtis obtained a score of *** suggesting {GBPHQ9SEVERITY:21752}. Kristine Curtis finds the endorsed symptoms to be {gbphq9difficulty:21754}. [0= Not at all; 1= Several days; 2= More than half the days; 3= Nearly every day] Little interest or pleasure in doing things ***  Feeling down, depressed, or hopeless ***  Trouble falling or staying asleep, or sleeping too much ***  Feeling tired or having little energy ***  Poor appetite or overeating ***  Feeling bad about yourself --- or that you are a failure or have let yourself or your family down ***  Trouble concentrating on things, such as reading the newspaper or watching television ***  Moving or speaking so slowly that other people could have noticed? Or the opposite --- being so fidgety or restless that you have been moving around a lot more than usual ***  Thoughts that you would be better off dead or hurting yourself in some way ***  PHQ-9 Score ***    The Generalized Anxiety Disorder-7 (GAD-7) is a brief self-report measure that assesses symptoms of anxiety over the course of the last two weeks. Laconda obtained a score of *** suggesting {gbgad7severity:21753}. Kristine Curtis finds the endorsed symptoms to be {gbphq9difficulty:21754}. [0= Not at all; 1= Several days; 2= Over half the days; 3= Nearly every day] Feeling nervous, anxious, on edge ***  Not being able to stop or control worrying ***  Worrying too much about different things ***  Trouble relaxing ***  Being so restless that it's hard to sit still ***  Becoming easily annoyed or irritable ***  Feeling afraid as if something awful might happen ***  GAD-7 Score ***   Interventions:  {Interventions for Progress Notes:23405}  DSM-5 Diagnosis: 311 (F32.8) Other Specified Depressive Disorder, Emotional Eating Behaviors  Treatment Goal & Progress: During the initial appointment with this provider, the following treatment goal was established: decrease emotional eating. Kristine Curtis has  demonstrated progress in her goal as evidenced by {gbtxprogress:22839}. Kristine Curtis also {gbtxprogress2:22951}.  Plan: The next appointment will be scheduled in {gbweeks:21758}, which will  be {gbtxmodality:23402}. The next session will focus on {Plan for Next Appointment:23400}.

## 2019-06-23 NOTE — Telephone Encounter (Signed)
Please review

## 2019-06-23 NOTE — Telephone Encounter (Addendum)
  Office: [phone]  /  Fax: [phone]  Date of Call: June 23, 2019 Time of Call: 3:57pm Duration of Call: 9 minutes Provider: Lawerance Cruel, PsyD  CONTENT: This provider called Adriona to address the MyChart message received today. This provider re-iterated what was shared yesterday and what is noted in the consent documents as it relates to documentation for appointments being placed in the electronic medical record. This provider also explained the "sensitive" feature that is clicked for documentation by this provider. Crystale verbally acknowledged understanding that the documentation for the initial appointment with this provider would not be altered or removed. She noted, "It's fine with your marking it as sensitive. This one will just have to be out there." Due to the aforementioned, she requested the next follow-up appointment with this provider be canceled and was receptive to referral options. She provided verbal consent for this provider to send a list of referral options via e-mail. No evidence of suicidal and homicidal ideation, plan, or intent  PLAN: Auset's follow-up appointment was canceled per her request and a list of referrals were e-mailed. No further follow-up planned by this provider.

## 2019-06-23 NOTE — Progress Notes (Signed)
Patient is here in-person for PV. Patient denies any allergies to eggs or soy. Patient denies any problems with anesthesia/sedation. Patient denies any oxygen use at home. Patient denies taking any diet/weight loss medications or blood thinners. Patient is not being treated for MRSA or C-diff. COVID-19 screening test is on 2/19, the pt is aware. Pt is aware that care partner will wait in the car during procedure; if they feel like they will be too hot or cold to wait in the car; they may wait in the 4 th floor lobby. Patient is aware to bring only one care partner. We want them to wear a mask (we do not have any that we can provide them), practice social distancing, and we will check their temperatures when they get here.  I did remind the patient that their care partner needs to stay in the parking lot the entire time and have a cell phone available, we will call them when the pt is ready for discharge. Patient will wear mask into building.    Pt unable to drink Miralax last colon, Suprep instructions given to pt. Pt aware of the cost.

## 2019-06-24 ENCOUNTER — Encounter: Payer: Self-pay | Admitting: Internal Medicine

## 2019-06-25 ENCOUNTER — Ambulatory Visit (INDEPENDENT_AMBULATORY_CARE_PROVIDER_SITE_OTHER): Payer: Medicare Other

## 2019-06-25 ENCOUNTER — Other Ambulatory Visit: Payer: Self-pay

## 2019-06-25 DIAGNOSIS — Z1159 Encounter for screening for other viral diseases: Secondary | ICD-10-CM

## 2019-06-26 LAB — SARS CORONAVIRUS 2 (TAT 6-24 HRS): SARS Coronavirus 2: NEGATIVE

## 2019-06-29 ENCOUNTER — Other Ambulatory Visit: Payer: Self-pay

## 2019-06-29 ENCOUNTER — Encounter: Payer: Self-pay | Admitting: Internal Medicine

## 2019-06-29 ENCOUNTER — Ambulatory Visit (AMBULATORY_SURGERY_CENTER): Payer: Medicare Other | Admitting: Internal Medicine

## 2019-06-29 VITALS — BP 121/69 | HR 87 | Temp 97.8°F | Resp 19 | Ht 63.0 in | Wt 236.0 lb

## 2019-06-29 DIAGNOSIS — R197 Diarrhea, unspecified: Secondary | ICD-10-CM | POA: Diagnosis not present

## 2019-06-29 DIAGNOSIS — K625 Hemorrhage of anus and rectum: Secondary | ICD-10-CM

## 2019-06-29 DIAGNOSIS — K6389 Other specified diseases of intestine: Secondary | ICD-10-CM

## 2019-06-29 MED ORDER — HYDROCORTISONE (PERIANAL) 2.5 % EX CREA: 1.0000 "application " | TOPICAL_CREAM | Freq: Two times a day (BID) | CUTANEOUS | 1 refills | Status: DC

## 2019-06-29 MED ORDER — SODIUM CHLORIDE 0.9 % IV SOLN: 500.0000 mL | Freq: Once | INTRAVENOUS | Status: DC

## 2019-06-29 NOTE — Progress Notes (Signed)
Temp by LC, vitals by DT   Pt's states no medical or surgical changes since previsit or office visit.  

## 2019-06-29 NOTE — Progress Notes (Signed)
Pt tolerated well. VSS. Awake and to recovery. 

## 2019-06-29 NOTE — Op Note (Signed)
[facility] Patient Name: Kristine Curtis Procedure Date: 06/29/2019 3:29 PM MRN: [id number] Endoscopist: Iva Boop , MD Age: 59 Referring MD:  Date of Birth: [birth year] Gender: Female Account #: [id number] Procedure:                Colonoscopy Indications:              Clinically significant diarrhea of unexplained                            origin, Rectal bleeding Medicines:                Propofol per Anesthesia, Monitored Anesthesia Care Procedure:                Pre-Anesthesia Assessment:                           - Prior to the procedure, a History and Physical                            was performed, and patient medications and                            allergies were reviewed. The patient's tolerance of                            previous anesthesia was also reviewed. The risks                            and benefits of the procedure and the sedation                            options and risks were discussed with the patient.                            All questions were answered, and informed consent                            was obtained. Prior Anticoagulants: The patient has                            taken no previous anticoagulant or antiplatelet                            agents. ASA Grade Assessment: III - A patient with                            severe systemic disease. After reviewing the risks                            and benefits, the patient was deemed in                            satisfactory condition to undergo the procedure.  After obtaining informed consent, the colonoscope                            was passed under direct vision. Throughout the                            procedure, the patient's blood pressure, pulse, and                            oxygen saturations were monitored continuously. The                            Colonoscope was introduced through the anus and                            advanced  to the the terminal ileum, with                            identification of the appendiceal orifice and IC                            valve. The colonoscopy was performed without                            difficulty. The patient tolerated the procedure                            well. The quality of the bowel preparation was                            good. The bowel preparation used was SUPREP via                            split dose instruction. The terminal ileum,                            ileocecal valve, appendiceal orifice, and rectum                            were photographed. Scope In: 3:47:29 PM Scope Out: 3:59:49 PM Scope Withdrawal Time: 0 hours 10 minutes 29 seconds  Total Procedure Duration: 0 hours 12 minutes 20 seconds  Findings:                 The perianal and digital rectal examinations were                            normal.                           The terminal ileum appeared normal.                           External and internal hemorrhoids were found.  The exam was otherwise without abnormality on                            direct and retroflexion views.                           Biopsies for histology were taken with a cold                            forceps from the ascending colon, transverse colon,                            descending colon and sigmoid colon for evaluation                            of microscopic colitis. Estimated blood loss was                            minimal. Complications:            No immediate complications. Estimated Blood Loss:     Estimated blood loss was minimal. Impression:               - The examined portion of the ileum was normal.                           - External and internal hemorrhoids.                           - The examination was otherwise normal on direct                            and retroflexion views.                           - Biopsies were taken with a cold forceps from the                             ascending colon, transverse colon, descending colon                            and sigmoid colon for evaluation of microscopic                            colitis. Recommendation:           - Patient has a contact number available for                            emergencies. The signs and symptoms of potential                            delayed complications were discussed with the                            patient. Return to normal activities tomorrow.  Written discharge instructions were provided to the                            patient.                           - Resume previous diet.                           - Continue present medications.                           - Await pathology results. If no colitis try IBS-D                            Tx                           - Repeat colonoscopy in 10 years for screening                            purposes.                           - Rx for 2.5% rectal hydrocortisone prn hemorrhoids                            was sent Iva Boop, MD 06/29/2019 4:09:16 PM This report has been signed electronically.

## 2019-06-29 NOTE — Patient Instructions (Addendum)
The bleeding came from hemorrhoids and they are swollen and can make you feel like you have to defecate.  I will prescribe some cream to use for the hemorrhoids - sent Rx to [facility] today.  The colon and small bowel lining I could see look great - but I took colon biopsies to look for microscopic inflammation that can be the cause of the diarrhea.  Once I get the biopsies back I will let you know.  I appreciate the opportunity to care for you. Iva Boop, MD, [facility]  Hemorrhoid handout given to patient.  Resume previous diet. Continue present medications.  Await pathology results.  Repeat colonoscopy in 10 years .  2.5% rectal hydrocortisone as needed for hemorrhoids.    YOU HAD AN ENDOSCOPIC PROCEDURE TODAY AT THE [facility]:   Refer to the procedure report that was given to you for any specific questions about what was found during the examination.  If the procedure report does not answer your questions, please call your gastroenterologist to clarify.  If you requested that your care partner not be given the details of your procedure findings, then the procedure report has been included in a sealed envelope for you to review at your convenience later.  YOU SHOULD EXPECT: Some feelings of bloating in the abdomen. Passage of more gas than usual.  Walking can help get rid of the air that was put into your GI tract during the procedure and reduce the bloating. If you had a lower endoscopy (such as a colonoscopy or flexible sigmoidoscopy) you may notice spotting of blood in your stool or on the toilet paper. If you underwent a bowel prep for your procedure, you may not have a normal bowel movement for a few days.  Please Note:  You might notice some irritation and congestion in your nose or some drainage.  This is from the oxygen used during your procedure.  There is no need for concern and it should clear up in a day or so.  SYMPTOMS TO REPORT IMMEDIATELY:   Following  lower endoscopy (colonoscopy or flexible sigmoidoscopy):  Excessive amounts of blood in the stool  Significant tenderness or worsening of abdominal pains  Swelling of the abdomen that is new, acute  Fever of 100F or higher For urgent or emergent issues, a gastroenterologist can be reached at any hour by calling [phone].   DIET:  We do recommend a small meal at first, but then you may proceed to your regular diet.  Drink plenty of fluids but you should avoid alcoholic beverages for 24 hours.  ACTIVITY:  You should plan to take it easy for the rest of today and you should NOT DRIVE or use heavy machinery until tomorrow (because of the sedation medicines used during the test).    FOLLOW UP: Our staff will call the number listed on your records 48-72 hours following your procedure to check on you and address any questions or concerns that you may have regarding the information given to you following your procedure. If we do not reach you, we will leave a message.  We will attempt to reach you two times.  During this call, we will ask if you have developed any symptoms of COVID 19. If you develop any symptoms (ie: fever, flu-like symptoms, shortness of breath, cough etc.) before then, please call [phone].  If you test positive for Covid 19 in the 2 weeks post procedure, please call and report this information to [place].  If any biopsies were taken you will be contacted by phone or by letter within the next 1-3 weeks.  Please call us at [phone] if you have not heard about the biopsies in 3 weeks.    SIGNATURES/CONFIDENTIALITY: You and/or your care partner have signed paperwork which will be entered into your electronic medical record.  These signatures attest to the fact that that the information above on your After Visit Summary has been reviewed and is understood.  Full responsibility of the confidentiality of this discharge information lies with you and/or your care-partner.

## 2019-07-01 ENCOUNTER — Telehealth: Payer: Self-pay

## 2019-07-01 NOTE — Telephone Encounter (Signed)
  Follow up Call-  Call back number 06/29/2019  Post procedure Call Back phone  # [phone]  Permission to leave phone message Yes  Some recent data might be hidden     Patient questions:  Do you have a fever, pain , or abdominal swelling? No. Pain Score  0 *  Have you tolerated food without any problems? Yes.    Have you been able to return to your normal activities? Yes.    Do you have any questions about your discharge instructions: Diet   No. Medications  No. Follow up visit  No.  Do you have questions or concerns about your Care? No.  Actions: * If pain score is 4 or above: No action needed, pain <4. 1. Have you developed a fever since your procedure? no  2.   Have you had an respiratory symptoms (SOB or cough) since your procedure? no  3.   Have you tested positive for COVID 19 since your procedure no  4.   Have you had any family members/close contacts diagnosed with the COVID 19 since your procedure?  no   If yes to any of these questions please route to Laverna Peace, RN and Jennye Boroughs, Charity fundraiser.

## 2019-07-05 ENCOUNTER — Other Ambulatory Visit: Payer: Self-pay | Admitting: *Deleted

## 2019-07-05 ENCOUNTER — Other Ambulatory Visit: Payer: Self-pay

## 2019-07-05 ENCOUNTER — Encounter: Payer: Self-pay | Admitting: *Deleted

## 2019-07-05 ENCOUNTER — Ambulatory Visit (INDEPENDENT_AMBULATORY_CARE_PROVIDER_SITE_OTHER): Payer: Medicare Other | Admitting: Bariatrics

## 2019-07-05 ENCOUNTER — Encounter (INDEPENDENT_AMBULATORY_CARE_PROVIDER_SITE_OTHER): Payer: Self-pay | Admitting: Bariatrics

## 2019-07-05 VITALS — BP 111/77 | HR 96 | Temp 98.4°F | Ht 63.0 in | Wt 223.0 lb

## 2019-07-05 DIAGNOSIS — I10 Essential (primary) hypertension: Secondary | ICD-10-CM | POA: Diagnosis not present

## 2019-07-05 DIAGNOSIS — E8881 Metabolic syndrome: Secondary | ICD-10-CM | POA: Diagnosis not present

## 2019-07-05 DIAGNOSIS — K52838 Other microscopic colitis: Secondary | ICD-10-CM

## 2019-07-05 DIAGNOSIS — E782 Mixed hyperlipidemia: Secondary | ICD-10-CM | POA: Diagnosis not present

## 2019-07-05 DIAGNOSIS — F3289 Other specified depressive episodes: Secondary | ICD-10-CM

## 2019-07-05 DIAGNOSIS — Z6839 Body mass index (BMI) 39.0-39.9, adult: Secondary | ICD-10-CM | POA: Diagnosis not present

## 2019-07-05 MED ORDER — BUDESONIDE 3 MG PO CPEP: 9.0000 mg | ORAL_CAPSULE | Freq: Every day | ORAL | 1 refills | Status: DC

## 2019-07-05 NOTE — Progress Notes (Signed)
Chief Complaint:   OBESITY Kristine Curtis is here to discuss her progress with her obesity treatment plan along with follow-up of her obesity related diagnoses. Kristine Curtis is on the Category 3 Plan and states she is following her eating plan approximately 50% of the time. Kristine Curtis states she is exercising 0 minutes 0 times per week.  Today's visit was #: 2 Starting weight: 226 lbs Starting date: 06/21/2019 Today's weight: 223 lbs Today's date: 07/05/2019 Total lbs lost to date: 3 Total lbs lost since last in-office visit: 3  Interim History: Kristine Curtis is down 3 lbs and doing well overall. She has had a colonoscopy that has disrupted things slightly (diagnosed with celiac disease).  Reviewed labs from last visit with patient ( vitamin D, A1c, and insulin ).   Subjective:   Insulin resistance. Kristine Curtis has a diagnosis of insulin resistance based on her elevated fasting insulin level >5. She continues to work on diet and exercise to decrease her risk of diabetes. No polyphagia.  Lab Results  Component Value Date   INSULIN 31.4 (H) 06/21/2019   Lab Results  Component Value Date   HGBA1C 4.8 06/21/2019   Mixed hyperlipidemia. Kristine Curtis is on no medications.   Lab Results  Component Value Date   CHOL 170 02/03/2019   HDL 50.20 02/03/2019   LDLCALC 93 02/03/2019   LDLDIRECT 75.0 03/20/2016   TRIG 132.0 02/03/2019   CHOLHDL 3 02/03/2019   Lab Results  Component Value Date   ALT 24 06/11/2019   AST 22 06/11/2019   ALKPHOS 90 06/11/2019   BILITOT 0.4 06/11/2019   The 10-year ASCVD risk score Kristine Bussing DC Jr., et al., 2013) is: 5.4%   Values used to calculate the score:     Age: 59 years     Sex: Female     Is Non-Hispanic African American: No     Diabetic: Yes     Tobacco smoker: No     Systolic Blood Pressure: 397 mmHg     Is BP treated: Yes     HDL Cholesterol: 50.2 mg/dL     Total Cholesterol: 170 mg/dL  Essential hypertension. Kayslee is taking medications. Blood pressure is well  controlled.  BP Readings from Last 3 Encounters:  07/05/19 111/77  06/29/19 121/69  06/21/19 131/75   Lab Results  Component Value Date   CREATININE 0.80 06/11/2019   CREATININE 0.85 06/01/2019   CREATININE 0.86 02/03/2019   Other microscopic colitis. Kristine Curtis's surgical pathology dated 06/29/2019 showed segments of microscopic colitis. She was started on budesonide. She has not met with her provider yet.  Other depression, with emotional eating. Kristine Curtis is struggling with emotional eating and using food for comfort to the extent that it is negatively impacting her health. She has been working on behavior modification techniques to help reduce her emotional eating and has been somewhat successful. She shows no sign of suicidal or homicidal ideations. Kristine Curtis has met with Dr. Mallie Mussel.  Assessment/Plan:   Insulin resistance. Kristine Curtis will continue to work on weight loss, exercise, increasing activity, and decreasing simple carbohydrates to help decrease the risk of diabetes. Kristine Curtis agreed to follow-up with [place] as directed to closely monitor her progress. She was given handout on Insulin Resistance.  Mixed hyperlipidemia. Cardiovascular risk and specific lipid/LDL goals reviewed.  We discussed several lifestyle modifications today and Saprina will continue to work on diet, exercise and weight loss efforts. Orders and follow up as documented in patient record. She will decrease carbohydrates, increase PUFA's  and MUFA's, decrease saturated fats, and increase activity.  Counseling Intensive lifestyle modifications are the first line treatment for this issue. . Dietary changes: Increase soluble fiber. Decrease simple carbohydrates. . Exercise changes: Moderate to vigorous-intensity aerobic activity 150 minutes per week if tolerated. . Lipid-lowering medications: see documented in medical record.  Essential hypertension. Kristine Curtis is working on healthy weight loss and exercise to improve blood pressure  control. We will watch for signs of hypotension as she continues her lifestyle modifications. She will continue medications as directed.  Other microscopic colitis. Kristine Curtis will follow-up with her GI doctor and will eliminate or minimize any gluten.  Other depression, with emotional eating. Behavior modification techniques were discussed today to help Kristine Curtis deal with her emotional/non-hunger eating behaviors.  Orders and follow up as documented in patient record. We discussed CBT techniques.  Class 2 severe obesity with serious comorbidity and body mass index (BMI) of 39.0 to 39.9 in adult, unspecified obesity type ([place]).  Kristine Curtis is currently in the action stage of change. As such, her goal is to continue with weight loss efforts. She has agreed to the Category 3 Plan with no gluten at this time.  She will work on meal planning, intentional eating, and will avoid sugary drinks.   Exercise goals: All adults should avoid inactivity. Some physical activity is better than none, and adults who participate in any amount of physical activity gain some health benefits.  Behavioral modification strategies: increasing lean protein intake, decreasing simple carbohydrates, increasing vegetables, increasing water intake, decreasing eating out, no skipping meals, meal planning and cooking strategies, keeping healthy foods in the home, dealing with family or coworker sabotage, travel eating strategies, holiday eating strategies  and celebration eating strategies.  Kristine Curtis has agreed to follow-up with our clinic in 2 weeks. She was informed of the importance of frequent follow-up visits to maximize her success with intensive lifestyle modifications for her multiple health conditions.   Objective:   Blood pressure 111/77, pulse 96, temperature 98.4 F (36.9 C), height _0  (1.6 m), weight 223 lb (101.2 kg), SpO2 98 %. Body mass index is 39.5 kg/m.  General: Cooperative, alert, well developed, in no acute  distress. HEENT: Conjunctivae and lids unremarkable. Cardiovascular: Regular rhythm.  Lungs: Normal work of breathing. Neurologic: No focal deficits.   Lab Results  Component Value Date   CREATININE 0.80 06/11/2019   BUN 10 06/11/2019   NA 140 06/11/2019   K 3.9 06/11/2019   CL 108 06/11/2019   CO2 25 06/11/2019   Lab Results  Component Value Date   ALT 24 06/11/2019   AST 22 06/11/2019   ALKPHOS 90 06/11/2019   BILITOT 0.4 06/11/2019   Lab Results  Component Value Date   HGBA1C 4.8 06/21/2019   HGBA1C 4.8 02/03/2019   HGBA1C 4.9 03/20/2016   HGBA1C 5.2 11/21/2014   Lab Results  Component Value Date   INSULIN 31.4 (H) 06/21/2019   Lab Results  Component Value Date   TSH 3.86 06/01/2019   Lab Results  Component Value Date   CHOL 170 02/03/2019   HDL 50.20 02/03/2019   LDLCALC 93 02/03/2019   LDLDIRECT 75.0 03/20/2016   TRIG 132.0 02/03/2019   CHOLHDL 3 02/03/2019   Lab Results  Component Value Date   WBC 5.5 06/11/2019   HGB 12.8 06/11/2019   HCT 38.1 06/11/2019   MCV 95.8 06/11/2019   PLT 229.0 06/11/2019   Lab Results  Component Value Date   IRON 41 (L) 11/17/2008  FERRITIN 25.1 11/17/2008   Obesity Behavioral Intervention Documentation for Insurance:   Approximately 15 minutes were spent on the discussion below.  ASK: We discussed the diagnosis of obesity with Kristine Curtis today and Kristine Curtis agreed to give [place] permission to discuss obesity behavioral modification therapy today.  ASSESS: Talene has the diagnosis of obesity and her BMI today is 39.5. Kristine Curtis is in the action stage of change.   ADVISE: Kristine Curtis was educated on the multiple health risks of obesity as well as the benefit of weight loss to improve her health. She was advised of the need for long term treatment and the importance of lifestyle modifications to improve her current health and to decrease her risk of future health problems.  AGREE: Multiple dietary modification options and  treatment options were discussed and Kristine Curtis agreed to follow the recommendations documented in the above note.  ARRANGE: Kristine Curtis was educated on the importance of frequent visits to treat obesity as outlined per CMS and USPSTF guidelines and agreed to schedule her next follow up appointment today.  Attestation Statements:   Reviewed by clinician on day of visit: allergies, medications, problem list, medical history, surgical history, family history, social history, and previous encounter notes.  Migdalia Dk, am acting as Location manager for [employer], DO   I have reviewed the above documentation for accuracy and completeness, and I agree with the above. Jearld Lesch, DO

## 2019-07-06 ENCOUNTER — Encounter (INDEPENDENT_AMBULATORY_CARE_PROVIDER_SITE_OTHER): Payer: Self-pay | Admitting: Bariatrics

## 2019-07-06 NOTE — Telephone Encounter (Signed)
Please review

## 2019-07-07 ENCOUNTER — Ambulatory Visit (INDEPENDENT_AMBULATORY_CARE_PROVIDER_SITE_OTHER): Payer: Self-pay | Admitting: Psychology

## 2019-07-19 ENCOUNTER — Other Ambulatory Visit: Payer: Self-pay

## 2019-07-19 ENCOUNTER — Encounter (INDEPENDENT_AMBULATORY_CARE_PROVIDER_SITE_OTHER): Payer: Self-pay | Admitting: Family Medicine

## 2019-07-19 ENCOUNTER — Ambulatory Visit (INDEPENDENT_AMBULATORY_CARE_PROVIDER_SITE_OTHER): Payer: Medicare Other | Admitting: Family Medicine

## 2019-07-19 ENCOUNTER — Encounter: Payer: Medicare Other | Admitting: Internal Medicine

## 2019-07-19 VITALS — BP 113/78 | HR 91 | Temp 98.5°F | Ht 63.0 in

## 2019-07-19 DIAGNOSIS — I10 Essential (primary) hypertension: Secondary | ICD-10-CM

## 2019-07-19 DIAGNOSIS — E8881 Metabolic syndrome: Secondary | ICD-10-CM | POA: Diagnosis not present

## 2019-07-19 DIAGNOSIS — F3289 Other specified depressive episodes: Secondary | ICD-10-CM

## 2019-07-19 DIAGNOSIS — Z6841 Body Mass Index (BMI) 40.0 and over, adult: Secondary | ICD-10-CM | POA: Diagnosis not present

## 2019-07-19 DIAGNOSIS — R197 Diarrhea, unspecified: Secondary | ICD-10-CM

## 2019-07-20 NOTE — Progress Notes (Signed)
Chief Complaint:   OBESITY Kristine Curtis is here to discuss her progress with her obesity treatment plan along with follow-up of her obesity related diagnoses. [name] is on the Category 3 Plan and states she is following her eating plan approximately 25% of the time. [name] states she is walking for 30-45 minutes 3 times per week.  Today's visit was #: 3 Starting weight: 226 lbs Starting date: 06/21/2019 Today's weight: 230 lbs Today's date: 07/19/2019 Total lbs lost to date: 0 Total lbs lost since last in-office visit: 0  Interim History: Kristine Curtis has been struggling with increased stress.  She is very happy with the new information regarding emotional eating.  Subjective:   1. Diarrhea Recent colonoscopy with Dr. Leone Payor. Question microscopic colitis. Started budesonide, which stopped diarrhea. No BM in 5 days, though. The differential diagnosis includes lymphocytic colitis, celiac disease, drug-effect (e.g. H2 blockers), viral infection and post-infectious colitis per Pathologist.  2. Insulin resistance [name] has a diagnosis of insulin resistance based on her elevated fasting insulin level >5. She continues to work on diet and exercise to decrease her risk of diabetes.  Lab Results  Component Value Date   INSULIN 31.4 (H) 06/21/2019   Lab Results  Component Value Date   HGBA1C 4.8 06/21/2019   3. Essential hypertension Review: taking medications as instructed, no medication side effects noted.  BP Readings from Last 3 Encounters:  07/19/19 113/78  07/05/19 111/77  06/29/19 121/69   4. Other depression, with emotional eating  Kristine Curtis is struggling with emotional eating and using food for comfort to the extent that it is negatively impacting her health. She has been working on behavior modification techniques to help reduce her emotional eating.   Assessment/Plan:   1. Diarrhea Will check celiac panel today. She will call GI to make sure that no BM in 5 days is  okay.  Orders - Celiac Disease Panel  2. Insulin resistance Kristine Curtis will continue to work on weight loss, exercise, and decreasing simple carbohydrates to help decrease the risk of diabetes. Kristine Curtis agreed to follow-up with [place] as directed to closely monitor her progress.  3. Essential hypertension Kristine Curtis is working on healthy weight loss and exercise to improve blood pressure control. We will watch for signs of hypotension as she continues her lifestyle modifications.  4. Other depression, with emotional eating  Behavior modification techniques were discussed today to help [name] deal with her emotional/non-hunger eating behaviors.  Orders and follow up as documented in patient record. Provided information included: [facility], Intuitive Eating Book and Workbook, Food and Feelings.   5. Class 3 severe obesity with serious comorbidity and body mass index (BMI) of 40.0 to 44.9 in adult, unspecified obesity type [facility]) Kristine Curtis is currently in the action stage of change. As such, her goal is to continue with weight loss efforts. She has agreed to the Category 3 Plan.   Exercise goals: As is.  Behavioral modification strategies: increasing lean protein intake and increasing water intake.  Kristine Curtis has agreed to follow-up with our clinic in 2 weeks. She was informed of the importance of frequent follow-up visits to maximize her success with intensive lifestyle modifications for her multiple health conditions.   Kristine Curtis was informed we would discuss her lab results at her next visit unless there is a critical issue that needs to be addressed sooner. Kristine Curtis agreed to keep her next visit at the agreed upon time to discuss these results.  Objective:   Blood pressure 113/78, pulse  91, temperature 98.5 F (36.9 C), temperature source Oral, height 5\' 3"  (1.6 m), SpO2 100 %. Body mass index is 39.5 kg/m.  General: Cooperative, alert, well developed, in no acute distress. HEENT: Conjunctivae  and lids unremarkable. Cardiovascular: Regular rhythm.  Lungs: Normal work of breathing. Neurologic: No focal deficits.   Lab Results  Component Value Date   CREATININE 0.80 06/11/2019   BUN 10 06/11/2019   NA 140 06/11/2019   K 3.9 06/11/2019   CL 108 06/11/2019   CO2 25 06/11/2019   Lab Results  Component Value Date   ALT 24 06/11/2019   AST 22 06/11/2019   ALKPHOS 90 06/11/2019   BILITOT 0.4 06/11/2019   Lab Results  Component Value Date   HGBA1C 4.8 06/21/2019   HGBA1C 4.8 02/03/2019   HGBA1C 4.9 03/20/2016   HGBA1C 5.2 11/21/2014   Lab Results  Component Value Date   INSULIN 31.4 (H) 06/21/2019   Lab Results  Component Value Date   TSH 3.86 06/01/2019   Lab Results  Component Value Date   CHOL 170 02/03/2019   HDL 50.20 02/03/2019   LDLCALC 93 02/03/2019   LDLDIRECT 75.0 03/20/2016   TRIG 132.0 02/03/2019   CHOLHDL 3 02/03/2019   Lab Results  Component Value Date   WBC 5.5 06/11/2019   HGB 12.8 06/11/2019   HCT 38.1 06/11/2019   MCV 95.8 06/11/2019   PLT 229.0 06/11/2019   Lab Results  Component Value Date   IRON 41 (L) 11/17/2008   FERRITIN 25.1 11/17/2008   Obesity Behavioral Intervention:   Approximately 15 minutes were spent on the discussion below.  ASK: We discussed the diagnosis of obesity with Kristine Curtis today and Kristine Curtis agreed to give [place] permission to discuss obesity behavioral modification therapy today.  ASSESS: Kristine Curtis has the diagnosis of obesity and her BMI today is 40.8. Kristine Curtis is in the action stage of change.   ADVISE: Kristine Curtis was educated on the multiple health risks of obesity as well as the benefit of weight loss to improve her health. She was advised of the need for long term treatment and the importance of lifestyle modifications to improve her current health and to decrease her risk of future health problems.  AGREE: Multiple dietary modification options and treatment options were discussed and Kristine Curtis agreed to follow the  recommendations documented in the above note.  ARRANGE: Kristine Curtis was educated on the importance of frequent visits to treat obesity as outlined per CMS and USPSTF guidelines and agreed to schedule her next follow up appointment today.  Attestation Statements:   Reviewed by clinician on day of visit: allergies, medications, problem list, medical history, surgical history, family history, social history, and previous encounter notes.  I, Water quality scientist, CMA, am acting as Location manager for [employer], DO.  I have reviewed the above documentation for accuracy and completeness, and I agree with the above. Briscoe Deutscher, DO

## 2019-07-21 LAB — CELIAC DISEASE PANEL
Endomysial IgA: NEGATIVE
IgA/Immunoglobulin A, Serum: 250 mg/dL (ref 87–352)
Transglutaminase IgA: <2 U/mL (ref 0–3)

## 2019-07-28 ENCOUNTER — Other Ambulatory Visit: Payer: Self-pay | Admitting: Internal Medicine

## 2019-07-29 ENCOUNTER — Encounter: Payer: Self-pay | Admitting: Family Medicine

## 2019-07-29 ENCOUNTER — Other Ambulatory Visit: Payer: Self-pay

## 2019-07-29 ENCOUNTER — Ambulatory Visit (INDEPENDENT_AMBULATORY_CARE_PROVIDER_SITE_OTHER): Payer: Medicare Other | Admitting: Family Medicine

## 2019-07-29 VITALS — BP 114/68 | HR 82 | Temp 97.9°F | Ht 63.0 in | Wt 236.4 lb

## 2019-07-29 DIAGNOSIS — G8929 Other chronic pain: Secondary | ICD-10-CM

## 2019-07-29 DIAGNOSIS — M3500 Sicca syndrome, unspecified: Secondary | ICD-10-CM

## 2019-07-29 DIAGNOSIS — I10 Essential (primary) hypertension: Secondary | ICD-10-CM

## 2019-07-29 DIAGNOSIS — F418 Other specified anxiety disorders: Secondary | ICD-10-CM

## 2019-07-29 DIAGNOSIS — M797 Fibromyalgia: Secondary | ICD-10-CM | POA: Diagnosis not present

## 2019-07-29 DIAGNOSIS — M5441 Lumbago with sciatica, right side: Secondary | ICD-10-CM

## 2019-07-29 DIAGNOSIS — E039 Hypothyroidism, unspecified: Secondary | ICD-10-CM

## 2019-07-29 DIAGNOSIS — Z79890 Hormone replacement therapy (postmenopausal): Secondary | ICD-10-CM | POA: Insufficient documentation

## 2019-07-29 DIAGNOSIS — J4540 Moderate persistent asthma, uncomplicated: Secondary | ICD-10-CM

## 2019-07-29 DIAGNOSIS — K52832 Lymphocytic colitis: Secondary | ICD-10-CM

## 2019-07-29 DIAGNOSIS — K219 Gastro-esophageal reflux disease without esophagitis: Secondary | ICD-10-CM

## 2019-07-29 NOTE — Progress Notes (Signed)
Subjective  CC:  Chief Complaint  Patient presents with  . Hypertension    readings are <140/80 at home. pt reports of daily headaches. denies dizziness  . Hypothyroidism    takes levothryoxine daily    HPI: Kristine Curtis is a 59 y.o. female who presents to the office today to address the problems listed above in the chief complaint.also here for 6 month f/u of chronic medical problems.   Hypertension f/u: Control is good . Pt reports she is doing well. taking medications as instructed, no medication side effects noted, no TIAs, no chest pain on exertion, no dyspnea on exertion, no swelling of ankles. She denies adverse effects from his BP medications. Compliance with medication is good.   Diarrhea: reviewed GI records. Colonoscopy showed signs of lymphocytic colitis: on bedesonide now and improving. Not completely resolved but better.   Obesity seeing healthy weight and wellness:working with therapist for emotional eating   Thyroid has been stable. Energy is good. No sxs of high or low thyroid  Fibromyalgia and chronic pain: stable. Has had neuro PT and it has made a bid difference. Intermittent flares managed with meds  Depression is well controlled on wellbutrin  gerd on ppi is stable  HRT  Assessment  1. Essential hypertension, on Losartan, Coreg, and Spironolactone   2. Chronic right-sided low back pain with right-sided sciatica   3. Fibromyalgia   4. Acquired hypothyroidism   5. Moderate persistent asthma without complication   6. Lymphocytic colitis   7. Sjogren's syndrome, with unspecified organ involvement ([place])   8. Depression with anxiety   9. Morbid obesity ([place])   10. Gastroesophageal reflux disease without esophagitis   11. Hormone replacement therapy (HRT)      Plan    Hypertension f/u: BP control is well controlled. No change in meds  Hyperlipidemia f/u: statin intolerant  Other problems are stable.  Education regarding management of these  chronic disease states was given. Management strategies discussed on successive visits include dietary and exercise recommendations, goals of achieving and maintaining IBW, and lifestyle modifications aiming for adequate sleep and minimizing stressors.   Follow up: Return in about 6 months (around 01/29/2020) for complete physical.  No orders of the defined types were placed in this encounter.  No orders of the defined types were placed in this encounter.     BP Readings from Last 3 Encounters:  07/29/19 114/68  07/19/19 113/78  07/05/19 111/77   Wt Readings from Last 3 Encounters:  07/29/19 236 lb 6.4 oz (107.2 kg)  07/05/19 223 lb (101.2 kg)  06/29/19 236 lb (107 kg)    Lab Results  Component Value Date   CHOL 170 02/03/2019   CHOL 140 07/14/2017   CHOL 165 03/20/2016   Lab Results  Component Value Date   HDL 50.20 02/03/2019   HDL 43.00 07/14/2017   HDL 37.20 (L) 03/20/2016   Lab Results  Component Value Date   LDLCALC 93 02/03/2019   LDLCALC 61 07/14/2017   LDLCALC 85 02/02/2015   Lab Results  Component Value Date   TRIG 132.0 02/03/2019   TRIG 181.0 (H) 07/14/2017   TRIG (H) 03/20/2016    419.0 Triglyceride is over 400; calculations on Lipids are invalid.   Lab Results  Component Value Date   CHOLHDL 3 02/03/2019   CHOLHDL 3 07/14/2017   CHOLHDL 4 03/20/2016   Lab Results  Component Value Date   LDLDIRECT 75.0 03/20/2016   Lab Results  Component Value  Date   CREATININE 0.80 06/11/2019   BUN 10 06/11/2019   NA 140 06/11/2019   K 3.9 06/11/2019   CL 108 06/11/2019   CO2 25 06/11/2019    The 10-year ASCVD risk score [name] DC Jr., et al., 2013) is: 5.7%   Values used to calculate the score:     Age: 89 years     Sex: Female     Is Non-Hispanic African American: No     Diabetic: Yes     Tobacco smoker: No     Systolic Blood Pressure: 114 mmHg     Is BP treated: Yes     HDL Cholesterol: 50.2 mg/dL     Total Cholesterol: 170 mg/dL  I reviewed  the patients updated PMH, FH, and SocHx.    Patient Active Problem List   Diagnosis Date Noted  . OSA (obstructive sleep apnea) 11/10/2017    Priority: High  . Myofascial pain dysfunction syndrome 07/14/2017    Priority: High  . Morbid obesity (HCC) 02/24/2017    Priority: High  . Sjogren's disease (HCC) 02/24/2017    Priority: High  . Fibromyalgia 01/21/2017    Priority: High  . Chronic low back pain with sciatica 05/02/2015    Priority: High  . Chronic systolic congestive heart failure (HCC), EF 35-40% 03/22/2015    Priority: High  . Depression with anxiety 08/31/2008    Priority: High  . Essential hypertension 08/31/2008    Priority: High  . Acquired hypothyroidism 08/30/2008    Priority: High  . Hormone replacement therapy (HRT) 07/29/2019    Priority: Medium  . IBS (irritable bowel syndrome) 11/10/2017    Priority: Medium  . RLS (restless legs syndrome) 10/01/2017    Priority: Medium  . Moderate persistent asthma 08/31/2008    Priority: Medium  . Esophageal reflux 08/31/2008    Priority: Medium  . Classical migraine without intractable migraine 08/30/2008    Priority: Medium  . B12 deficiency 11/10/2017    Priority: Low  . Myalgia due to statin 11/10/2017    Priority: Low  . Degenerative arthritis of left knee, XRAY12/2018 07/13/2017    Priority: Low  . Vitamin D deficiency 10/09/2009    Priority: Low  . Allergic rhinitis 08/31/2008    Priority: Low  . Lumbar disc disease 10/01/2017  . Cervical radiculopathy 07/14/2017    Allergies: Gabapentin (once-daily), Saxenda [liraglutide -weight management], Sulfa antibiotics, Trintellix [vortioxetine], Adhesive [tape], and Codeine  Social History: Patient  reports that she quit smoking about 30 years ago. Her smoking use included cigarettes. She has a 24.00 pack-year smoking history. She has never used smokeless tobacco. She reports that she does not drink alcohol or use drugs.  Current Meds  Medication Sig  .  budesonide (ENTOCORT EC) 3 MG 24 hr capsule TAKE 3 CAPSULES BY MOUTH EVERY DAY  . buPROPion (WELLBUTRIN XL) 300 MG 24 hr tablet TAKE 1 TABLET BY MOUTH DAILY  . carvedilol (COREG) 12.5 MG tablet Take 1 tablet (12.5 mg total) by mouth 2 (two) times daily with a meal.  . Cholecalciferol (VITAMIN D-3) 25 MCG (1000 UT) CAPS Take 1,000 Units by mouth daily.   . cyclobenzaprine (FLEXERIL) 5 MG tablet Take 1 tablet by mouth daily.  . diazepam (VALIUM) 5 MG tablet Take 1 tablet (5 mg total) by mouth daily as needed for anxiety.  . Estradiol-Progesterone 1-100 MG CAPS Take 1 capsule by mouth at bedtime.  . furosemide (LASIX) 20 MG tablet Take 1 tablet (20 mg total) by  mouth daily.  . Galcanezumab-gnlm (EMGALITY) 120 MG/ML SOAJ Inject 120 mg into the skin every 30 (thirty) days.  . hydrocortisone (ANUSOL-HC) 2.5 % rectal cream Place 1 application rectally 2 (two) times daily.  . hydroxychloroquine (PLAQUENIL) 200 MG tablet Take 2 tablets (400 mg total) by mouth every morning.  . hydrOXYzine (ATARAX/VISTARIL) 25 MG tablet Take 1 tablet (25 mg total) by mouth at bedtime as needed for itching (insomnia).  Marland Kitchen levothyroxine (SYNTHROID) 100 MCG tablet TAKE ONE TABLET EVERY DAY  . losartan (COZAAR) 25 MG tablet Take 1 tablet (25 mg total) by mouth 2 (two) times daily.  . meloxicam (MOBIC) 7.5 MG tablet TAKE ONE TABLET BY MOUTH EVERY DAY  . nystatin-triamcinolone ointment (MYCOLOG) Apply 1 application topically 2 (two) times daily.  Marland Kitchen omeprazole (PRILOSEC) 40 MG capsule Take 1 capsule (40 mg total) by mouth daily.  Marland Kitchen OVER THE COUNTER MEDICATION Take 5 capsules by mouth daily. [employer] Optimals Vitamin & Minerals   . OVER THE COUNTER MEDICATION Take 1 scoop by mouth daily. Ariix Magnecal D   . oxyCODONE-acetaminophen (PERCOCET) 10-325 MG tablet Take 1 tablet by mouth 2 (two) times daily as needed for pain.  Marland Kitchen spironolactone (ALDACTONE) 25 MG tablet TAKE ONE TABLET EVERY DAY  . topiramate (TOPAMAX) 25 MG tablet Take 1  tablet (25 mg total) by mouth 2 (two) times daily.  Marland Kitchen triamcinolone (KENALOG) 0.025 % ointment Apply 1 application topically 2 (two) times daily.  . Wheat Dextrin (BENEFIBER) POWD Take 2 scoop by mouth daily as needed (for constipation).     Review of Systems: Cardiovascular: negative for chest pain, palpitations, leg swelling, orthopnea Respiratory: negative for SOB, wheezing or persistent cough Gastrointestinal: negative for abdominal pain Genitourinary: negative for dysuria or gross hematuria  Objective  Vitals: BP 114/68 (BP Location: Left Arm, Patient Position: Sitting, Cuff Size: Large)   Pulse 82   Temp 97.9 F (36.6 C) (Temporal)   Ht 5\' 3"  (1.6 m)   Wt 236 lb 6.4 oz (107.2 kg)   SpO2 97%   BMI 41.88 kg/m  General: no acute distress  Psych:  Alert and oriented, normal mood and affect HEENT:  Normocephalic, atraumatic, supple neck  Cardiovascular:  RRR without murmur. no edema Respiratory:  Good breath sounds bilaterally, CTAB with normal respiratory effort Skin:  Warm, no rashes Neurologic:   Mental status is normal  Commons side effects, risks, benefits, and alternatives for medications and treatment plan prescribed today were discussed, and the patient expressed understanding of the given instructions. Patient is instructed to call or message via MyChart if he/she has any questions or concerns regarding our treatment plan. No barriers to understanding were identified. We discussed Red Flag symptoms and signs in detail. Patient expressed understanding regarding what to do in case of urgent or emergency type symptoms.   Medication list was reconciled, printed and provided to the patient in AVS. Patient instructions and summary information was reviewed with the patient as documented in the AVS. This note was prepared with assistance of Dragon voice recognition software. Occasional wrong-word or sound-a-like substitutions may have occurred due to the inherent limitations of  voice recognition software  This visit occurred during the SARS-CoV-2 public health emergency.  Safety protocols were in place, including screening questions prior to the visit, additional usage of staff PPE, and extensive cleaning of exam room while observing appropriate contact time as indicated for disinfecting solutions.

## 2019-07-29 NOTE — Patient Instructions (Signed)
Please return in 6 months for your annual complete physical; please come fasting.  Good luck to you and I hope you continue to do well.  If you have any questions or concerns, please don't hesitate to send me a message via MyChart or call the office at [phone]. Thank you for visiting with [place] today! It's our pleasure caring for you.

## 2019-08-03 ENCOUNTER — Ambulatory Visit (INDEPENDENT_AMBULATORY_CARE_PROVIDER_SITE_OTHER): Payer: Medicare Other | Admitting: Family Medicine

## 2019-08-09 ENCOUNTER — Encounter (INDEPENDENT_AMBULATORY_CARE_PROVIDER_SITE_OTHER): Payer: Self-pay | Admitting: Family Medicine

## 2019-08-09 ENCOUNTER — Other Ambulatory Visit: Payer: Self-pay

## 2019-08-09 ENCOUNTER — Ambulatory Visit (INDEPENDENT_AMBULATORY_CARE_PROVIDER_SITE_OTHER): Payer: Medicare Other | Admitting: Family Medicine

## 2019-08-09 VITALS — BP 121/80 | HR 95 | Temp 98.1°F | Ht 63.0 in | Wt 231.0 lb

## 2019-08-09 DIAGNOSIS — K0889 Other specified disorders of teeth and supporting structures: Secondary | ICD-10-CM

## 2019-08-09 DIAGNOSIS — Z6841 Body Mass Index (BMI) 40.0 and over, adult: Secondary | ICD-10-CM | POA: Diagnosis not present

## 2019-08-09 DIAGNOSIS — M797 Fibromyalgia: Secondary | ICD-10-CM | POA: Diagnosis not present

## 2019-08-09 DIAGNOSIS — Z79899 Other long term (current) drug therapy: Secondary | ICD-10-CM

## 2019-08-09 DIAGNOSIS — K52838 Other microscopic colitis: Secondary | ICD-10-CM

## 2019-08-09 DIAGNOSIS — E66813 Obesity, class 3: Secondary | ICD-10-CM

## 2019-08-09 DIAGNOSIS — F3289 Other specified depressive episodes: Secondary | ICD-10-CM

## 2019-08-10 NOTE — Progress Notes (Signed)
Chief Complaint:   OBESITY Kristine Curtis is here to discuss her progress with her obesity treatment plan along with follow-up of her obesity related diagnoses. Kristine Curtis is on the Category 3 Plan and states she is following her eating plan approximately 85% of the time. Kristine Curtis states she is walking for 30 minutes 4 times per week.  Today's visit was #: 4 Starting weight: 226 lb Starting date: 06/21/2019 Today's weight: 231 lbs Today's date: 08/09/2019 Total lbs lost to date: 0 Total lbs lost since last in-office visit: 0  Interim History: Kristine Curtis has been unable to follow the plan for about 9 days so far due to dental surgery.  She reports drinking plenty of fluids.  Subjective:   1. Other microscopic colitis Kristine Curtis is taking budesonide with some relief.  She is followed by Dr. Carlean Purl.  No more constipation/diarrhea.  2. Fibromyalgia Kristine Curtis uses exercise and stretching for control.  3. Kristine Curtis had oral surgery with bone graft last week.  Unable to tolerate warm or solid foods.  She will see the oral surgeon for follow-up this week.    4. High risk medication use Kristine Curtis is taking low dose budesonide.    5. Other depression, with emotional eating  Kristine Curtis is working hard on identifying emotional eating triggers and changing behaviors.  She is reading 'Intuitive Eating' and doing workbook.  Assessment/Plan:   1. Other microscopic colitis Followed by Dr. Carlean Purl for this problem. Those encounter notes were reviewed. Orders and follow up as documented in patient record.  2. Fibromyalgia Intensive lifestyle modifications are the first line treatment for this issue. We discussed several lifestyle modifications today and she will continue to work on diet, exercise and weight loss efforts.We will continue to monitor. Orders and follow up as documented in patient record.   Counseling . Try [email or web address]-[email or web address]/, which is a series of self-care modules designed to teach  patients several techniques to manage pain.   3. Dentalgia Will follow along regarding healing.  We reviewed soft and liquid protein sources and the importance of nutrition in healing.  4. High risk medication use Kristine Curtis is taking low dose budesonide.  There are fewer side effects with this compared to other glucocorticoids, but will monitor for adrenal suppression or worsening insulin resistance.  5. Other depression, with emotional eating  Behavior modification techniques were discussed today to help Kristine Curtis deal with her emotional/non-hunger eating behaviors.  Orders and follow up as documented in patient record.   6. Class 3 severe obesity with serious comorbidity and body mass index (BMI) of 40.0 to 44.9 in adult, unspecified obesity type Kristine Curtis) Kristine Curtis is currently in the action stage of change. As such, her goal is to continue with weight loss efforts. She has agreed to the Category 3 Plan.   Exercise goals: As is.  Behavioral modification strategies: increasing lean protein intake and increasing water intake.  Kristine Curtis has agreed to follow-up with our clinic in 2 weeks. She was informed of the importance of frequent follow-up visits to maximize her success with intensive lifestyle modifications for her multiple health conditions.   Objective:   Blood pressure 121/80, pulse 95, temperature 98.1 F (36.7 C), temperature source Oral, height 5\' 3"  (1.6 m), weight 231 lb (104.8 kg), SpO2 98 %. Body mass index is 40.92 kg/m.  General: Cooperative, alert, well developed, in no acute distress. HEENT: Conjunctivae and lids unremarkable. Cardiovascular: Regular rhythm.  Lungs: Normal work of breathing. Neurologic: No focal deficits.   Lab Results  Component  Value Date   CREATININE 0.80 06/11/2019   BUN 10 06/11/2019   NA 140 06/11/2019   K 3.9 06/11/2019   CL 108 06/11/2019   CO2 25 06/11/2019   Lab Results  Component Value Date   ALT 24 06/11/2019   AST 22 06/11/2019   ALKPHOS  90 06/11/2019   BILITOT 0.4 06/11/2019   Lab Results  Component Value Date   HGBA1C 4.8 06/21/2019   HGBA1C 4.8 02/03/2019   HGBA1C 4.9 03/20/2016   HGBA1C 5.2 11/21/2014   Lab Results  Component Value Date   INSULIN 31.4 (H) 06/21/2019   Lab Results  Component Value Date   TSH 3.86 06/01/2019   Lab Results  Component Value Date   CHOL 170 02/03/2019   HDL 50.20 02/03/2019   LDLCALC 93 02/03/2019   LDLDIRECT 75.0 03/20/2016   TRIG 132.0 02/03/2019   CHOLHDL 3 02/03/2019   Lab Results  Component Value Date   WBC 5.5 06/11/2019   HGB 12.8 06/11/2019   HCT 38.1 06/11/2019   MCV 95.8 06/11/2019   PLT 229.0 06/11/2019   Lab Results  Component Value Date   IRON 41 (L) 11/17/2008   FERRITIN 25.1 11/17/2008    Obesity Behavioral Intervention:   Approximately 15 minutes were spent on the discussion below.  ASK: We discussed the diagnosis of obesity with Kristine Curtis today and Kristine Curtis agreed to give [place] permission to discuss obesity behavioral modification therapy today.  ASSESS: Kristine Curtis has the diagnosis of obesity and her BMI today is 41.0. Kristine Curtis is in the action stage of change.   ADVISE: Kristine Curtis was educated on the multiple health risks of obesity as well as the benefit of weight loss to improve her health. She was advised of the need for long term treatment and the importance of lifestyle modifications to improve her current health and to decrease her risk of future health problems.  AGREE: Multiple dietary modification options and treatment options were discussed and Kristine Curtis agreed to follow the recommendations documented in the above note.  ARRANGE: Kristine Curtis was educated on the importance of frequent visits to treat obesity as outlined per CMS and USPSTF guidelines and agreed to schedule her next follow up appointment today.  Attestation Statements:   Reviewed by clinician on day of visit: allergies, medications, problem list, medical history, surgical history, family  history, social history, and previous encounter notes.  I, Insurance claims handler, CMA, am acting as Energy manager for [employer], DO.  I have reviewed the above documentation for accuracy and completeness, and I agree with the above. Helane Rima, DO

## 2019-08-13 ENCOUNTER — Encounter (INDEPENDENT_AMBULATORY_CARE_PROVIDER_SITE_OTHER): Payer: Self-pay | Admitting: Family Medicine

## 2019-08-16 NOTE — Telephone Encounter (Signed)
Please advise. Thanks.  

## 2019-08-17 ENCOUNTER — Encounter: Payer: Self-pay | Admitting: Internal Medicine

## 2019-08-17 ENCOUNTER — Ambulatory Visit (INDEPENDENT_AMBULATORY_CARE_PROVIDER_SITE_OTHER): Payer: Medicare Other | Admitting: Internal Medicine

## 2019-08-17 VITALS — BP 100/76 | HR 80 | Temp 97.5°F | Ht 63.0 in | Wt 234.0 lb

## 2019-08-17 DIAGNOSIS — K52832 Lymphocytic colitis: Secondary | ICD-10-CM | POA: Diagnosis not present

## 2019-08-17 DIAGNOSIS — K582 Mixed irritable bowel syndrome: Secondary | ICD-10-CM

## 2019-08-17 NOTE — Progress Notes (Signed)
Kristine Curtis 59 y.o. 1960-06-08 [id number]  Assessment & Plan:   Encounter Diagnoses  Name Primary?  . Lymphocytic colitis Yes  . Irritable bowel syndrome with both constipation and diarrhea     She is improved she tells me but still is not at her normal baseline.  It sounds like she has underlying IBS and she clearly had a severe diarrhea severe diarrheal illness that presumably was related to lymphocytic colitis but I explained to her that sometimes really that is what we see an irritable bowel pattern.  She has not reached 8 weeks of treatment with budesonide I am going to have her continue it and come back in a month and regroup.  It would seem unlikely that she has overflow issues from an impaction given her situation.  The bleeding is hemorrhoidal and its better overall.  She may need to return to fiber supplementation she reports psyllium has been somewhat helpful to regulate defecation in the past.  Other consideration could be given towards testing for small intestinal bacterial overgrowth versus empiric treatment.  CC: Leamon Arnt, MD   Subjective:   Chief Complaint: Lymphocytic colitis follow-up  HPI Kristine Curtis is here for follow-up of lymphocytic colitis, this was diagnosed at colonoscopy on February 23 and she started budesonide 9 mg daily and soon after that went 7 days without defecation.  She got some relief with a quarter bottle of magnesium citrate.  Since then she has had a regular always loose bowel habits not necessarily every day but up to several in a day.  Nocturnal diarrhea is gone.  When she presented in early February she had numerous watery bowel movements on a daily basis including nocturnal with previous infectious work-up negative.  She had been having some rectal bleeding which is less but still occurs on the days she defecates after not having had a bowel movement in several days.  She is managing this overall but says she is not back to her normal  irregular more formed defecation and an IBS mixed pattern I would say.  She is in the medical bariatric clinic working with Dr. Juleen China trying to lose weight as well.  Her irregular defecation patterns and swings have made that somewhat difficult.  She cannot relate bowel movement changes now the medication changes or other dietary triggers. Allergies  Allergen Reactions  . Gabapentin (Once-Daily)     Depression   . Saxenda [Liraglutide -Weight Management] Other (See Comments)    Depression   . Sulfa Antibiotics     itching  . Trintellix [Vortioxetine] Other (See Comments)    GI issues   . Adhesive [Tape] Rash  . Codeine Itching   Current Meds  Medication Sig  . budesonide (ENTOCORT EC) 3 MG 24 hr capsule TAKE 3 CAPSULES BY MOUTH EVERY DAY  . buPROPion (WELLBUTRIN XL) 300 MG 24 hr tablet TAKE 1 TABLET BY MOUTH DAILY  . carvedilol (COREG) 12.5 MG tablet Take 1 tablet (12.5 mg total) by mouth 2 (two) times daily with a meal.  . Cholecalciferol (VITAMIN D-3) 25 MCG (1000 UT) CAPS Take 1,000 Units by mouth daily.   . cyclobenzaprine (FLEXERIL) 5 MG tablet Take 1 tablet by mouth daily.  . diazepam (VALIUM) 5 MG tablet Take 1 tablet (5 mg total) by mouth daily as needed for anxiety.  . Estradiol-Progesterone 1-100 MG CAPS Take 1 capsule by mouth at bedtime.  . fluticasone (FLONASE) 50 MCG/ACT nasal spray 1 SPRAY IN EACH NOSTRIL EVERY DAY  .  furosemide (LASIX) 20 MG tablet Take 1 tablet (20 mg total) by mouth daily.  . Galcanezumab-gnlm (EMGALITY) 120 MG/ML SOAJ Inject 120 mg into the skin every 30 (thirty) days.  . hydrocortisone (ANUSOL-HC) 2.5 % rectal cream Place 1 application rectally 2 (two) times daily.  . hydroxychloroquine (PLAQUENIL) 200 MG tablet Take 2 tablets (400 mg total) by mouth every morning.  . hydrOXYzine (ATARAX/VISTARIL) 25 MG tablet Take 1 tablet (25 mg total) by mouth at bedtime as needed for itching (insomnia).  Marland Kitchen levothyroxine (SYNTHROID) 100 MCG tablet TAKE ONE TABLET  EVERY DAY  . losartan (COZAAR) 25 MG tablet Take 1 tablet (25 mg total) by mouth 2 (two) times daily.  . meloxicam (MOBIC) 7.5 MG tablet TAKE ONE TABLET BY MOUTH EVERY DAY  . nystatin-triamcinolone ointment (MYCOLOG) Apply 1 application topically 2 (two) times daily.  Marland Kitchen omeprazole (PRILOSEC) 40 MG capsule Take 1 capsule (40 mg total) by mouth daily.  Marland Kitchen OVER THE COUNTER MEDICATION Take 5 capsules by mouth daily. Ariix Optimals Vitamin & Minerals   . OVER THE COUNTER MEDICATION Take 1 scoop by mouth daily. Ariix Magnecal D   . oxyCODONE-acetaminophen (PERCOCET) 10-325 MG tablet Take 1 tablet by mouth 2 (two) times daily as needed for pain.  Marland Kitchen spironolactone (ALDACTONE) 25 MG tablet TAKE ONE TABLET EVERY DAY  . topiramate (TOPAMAX) 25 MG tablet Take 1 tablet (25 mg total) by mouth 2 (two) times daily.  Marland Kitchen triamcinolone (KENALOG) 0.025 % ointment Apply 1 application topically 2 (two) times daily.  . Wheat Dextrin (BENEFIBER) POWD Take 2 scoop by mouth daily as needed (for constipation).    Past Medical History:  Diagnosis Date  . Allergic rhinitis due to pollen   . Anxiety   . Asthma   . Carpal tunnel syndrome of right wrist 12/22/2017  . Chronic combined systolic and diastolic CHF (congestive heart failure) (HCC)    06/2016 Echo: EF 40-45%, Gr1 DD  . Chronic fatigue   . Depression   . Family history of polycystic kidney with negative CT 11/16/2012  . Fibromyalgia   . GERD (gastroesophageal reflux disease)   . Hypertension   . Hypothyroidism   . IBS (irritable bowel syndrome)   . Internal hemorrhoids   . Morbid obesity (HCC)   . NICM (nonischemic cardiomyopathy) (HCC)    a. 1999 Cath: nl cors;  b. EF prev as low as 35%;  c. 11/2015 Echo: Ef 40-45%;  d. 06/2016 Echo: EF 40-45%;  e. Lexiscan MV: fixed anterosepta/inferseptal defect w/o ischemia, EF 35%.  . Osteoarthritis   . PONV (postoperative nausea and vomiting)   . PVC (premature ventricular contraction)   . Restless leg syndrome   .  Sjogren's syndrome (HCC)   . Vertigo    Past Surgical History:  Procedure Laterality Date  . ABDOMINAL HYSTERECTOMY    . CESAREAN SECTION    . CHOLECYSTECTOMY  10-2008  . COLONOSCOPY  02/14/2015  . ESOPHAGOGASTRODUODENOSCOPY    . EYE SURGERY     2 2013 and 1981/DCR of right eye 05/2014  . LUMBAR DISC SURGERY  2016   L3-4  . RIGHT/LEFT HEART CATH AND CORONARY ANGIOGRAPHY N/A 07/11/2016   Procedure: Right/Left Heart Cath and Coronary Angiography;  Surgeon: Peter M Swaziland, MD;  Location: [facility] INVASIVE CV LAB;  Service: Cardiovascular;  Laterality: N/A;  . SHOULDER ARTHROSCOPY W/ SUBACROMIAL DECOMPRESSION AND DISTAL CLAVICLE EXCISION Right   . SPINE SURGERY     C6-C7, 03/2017  . TUBAL LIGATION  1992  Social History   Social History Narrative   Former Dr. Andrey Campanile then Dr. 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   Review of Systems  As above Objective:   Physical Exam BP 100/76   Pulse 80   Temp (!) 97.5 F (36.4 C)   Ht 5\' 3"  (1.6 m)   Wt 234 lb (106.1 kg)   BMI 41.45 kg/m  NAD

## 2019-08-17 NOTE — Patient Instructions (Signed)
Continue current regimen.   Follow up in one month on 08-17-19 at 11:10 am with Dr Leone Payor.   Due to recent changes in healthcare laws, you may see the results of your imaging and laboratory studies on MyChart before your provider has had a chance to review them.  We understand that in some cases there may be results that are confusing or concerning to you. Not all laboratory results come back in the same time frame and the provider may be waiting for multiple results in order to interpret others.  Please give [place] 48 hours in order for your provider to thoroughly review all the results before contacting the office for clarification of your results.    Thank you for trusting me with your gastrointestinal care!    Stan Head, MD

## 2019-08-24 ENCOUNTER — Telehealth: Payer: Self-pay | Admitting: Cardiology

## 2019-08-24 NOTE — Telephone Encounter (Signed)
LMOM RE: F/U Visit--- AF 

## 2019-09-01 ENCOUNTER — Other Ambulatory Visit: Payer: Self-pay

## 2019-09-01 ENCOUNTER — Encounter (INDEPENDENT_AMBULATORY_CARE_PROVIDER_SITE_OTHER): Payer: Self-pay | Admitting: Family Medicine

## 2019-09-01 ENCOUNTER — Ambulatory Visit (INDEPENDENT_AMBULATORY_CARE_PROVIDER_SITE_OTHER): Payer: Medicare Other | Admitting: Family Medicine

## 2019-09-01 VITALS — BP 121/73 | HR 91 | Temp 99.0°F | Ht 63.0 in | Wt 230.0 lb

## 2019-09-01 DIAGNOSIS — M797 Fibromyalgia: Secondary | ICD-10-CM | POA: Diagnosis not present

## 2019-09-01 DIAGNOSIS — K588 Other irritable bowel syndrome: Secondary | ICD-10-CM

## 2019-09-01 DIAGNOSIS — Z6841 Body Mass Index (BMI) 40.0 and over, adult: Secondary | ICD-10-CM

## 2019-09-01 DIAGNOSIS — F3289 Other specified depressive episodes: Secondary | ICD-10-CM

## 2019-09-02 NOTE — Progress Notes (Signed)
Cardiology Office Note    Date:  09/06/2019   ID:  Kristine Curtis, DOB 08-11-1960, MRN [id number]  PCP:  Leamon Arnt, MD  Cardiologist:  Dr. Martinique   Chief Complaint  Patient presents with  . Congestive Heart Failure    History of Present Illness:  Kristine Curtis is a 59 y.o. female with PMH of NICM, PVCs, HTN, fibromyalgia, morbid obesity, hypothyroidism, RLS and vertigo.She had a cardiac catheterization in 1999 that showed normal coronaries. EF previously as low as 35%,EFimproved to 40-45% by2017. Cardiac MRI in 2017 showed EF 44% with global hypokinesis, no scar or infiltration. She was admitted in February 2018 with 24 hour history of chest pain, dyspnea and headache. Cardiac enzyme were negative. Limited echocardiogram showed EF 40-45% without pericardial effusion. Stress test showed fixed anteroseptal and inferoseptal defect without ischemia. EF was measured 35%. Despite Myoview showing no ischemia, she continued to have exertional chest discomfort and subsequently underwent a left and right heart cath 07/11/2016 which showed normal coronaries. She also had normal cardiac output and EF 35-40%. She had a sleep study that showed in the past no obstructive sleep apnea.She does have restless leg syndrome  Patient was admitted in September 2018 with a episode of syncope.  Outpatient 30-day event monitor was negative for significant arrhythmia.  On follow up today she is doing quite well. She is now seeing Dr Juleen China at [facility] weight program and working on psychologic triggers for her eating. She has been having issues since January with diarrhea and bleeding related to colitis. Now on budesonide.  Denies any dyspnea, palpitations, dizziness, or chest pain. Feels like she is in a good place from a cardiac standpoint. No edema.      Past Medical History:  Diagnosis Date  . Allergic rhinitis due to pollen   . Anxiety   . Asthma   . Carpal tunnel syndrome of right wrist  12/22/2017  . Chronic combined systolic and diastolic CHF (congestive heart failure) ([place])    06/2016 Echo: EF 40-45%, Gr1 DD  . Chronic fatigue   . Depression   . Family history of polycystic kidney with negative CT 11/16/2012  . Fibromyalgia   . GERD (gastroesophageal reflux disease)   . Hypertension   . Hypothyroidism   . IBS (irritable bowel syndrome)   . Internal hemorrhoids   . Morbid obesity ([place])   . NICM (nonischemic cardiomyopathy) ([place])    a. 1999 Cath: nl cors;  b. EF prev as low as 35%;  c. 11/2015 Echo: Ef 40-45%;  d. 06/2016 Echo: EF 40-45%;  e. Lexiscan MV: fixed anterosepta/inferseptal defect w/o ischemia, EF 35%.  . Osteoarthritis   . PONV (postoperative nausea and vomiting)   . PVC (premature ventricular contraction)   . Restless leg syndrome   . Sjogren's syndrome ([place])   . Vertigo     Past Surgical History:  Procedure Laterality Date  . ABDOMINAL HYSTERECTOMY    . CESAREAN SECTION    . CHOLECYSTECTOMY  10-2008  . COLONOSCOPY  02/14/2015  . ESOPHAGOGASTRODUODENOSCOPY    . EYE SURGERY     2 2013 and 1981/DCR of right eye 05/2014  . LUMBAR DISC SURGERY  2016   L3-4  . RIGHT/LEFT HEART CATH AND CORONARY ANGIOGRAPHY N/A 07/11/2016   Procedure: Right/Left Heart Cath and Coronary Angiography;  Surgeon: Aleanna Menge M Martinique, MD;  Location: [facility];  Service: Cardiovascular;  Laterality: N/A;  . SHOULDER ARTHROSCOPY W/ SUBACROMIAL DECOMPRESSION AND DISTAL CLAVICLE EXCISION  Right   . SPINE SURGERY     C6-C7, 03/2017  . TUBAL LIGATION  1992    Current Medications: Outpatient Medications Prior to Visit  Medication Sig Dispense Refill  . budesonide (ENTOCORT EC) 3 MG 24 hr capsule TAKE 3 CAPSULES BY MOUTH EVERY DAY 90 capsule 1  . buPROPion (WELLBUTRIN XL) 300 MG 24 hr tablet TAKE 1 TABLET BY MOUTH DAILY 30 tablet 11  . carvedilol (COREG) 12.5 MG tablet Take 1 tablet (12.5 mg total) by mouth 2 (two) times daily with a meal. 180 tablet 3  . Cholecalciferol (VITAMIN  D-3) 25 MCG (1000 UT) CAPS Take 1,000 Units by mouth daily.     . cyclobenzaprine (FLEXERIL) 5 MG tablet Take 1 tablet by mouth daily.    . diazepam (VALIUM) 5 MG tablet Take 1 tablet (5 mg total) by mouth daily as needed for anxiety. 30 tablet 2  . Estradiol-Progesterone 1-100 MG CAPS Take 1 capsule by mouth at bedtime.    . fluticasone (FLONASE) 50 MCG/ACT nasal spray 1 SPRAY IN EACH NOSTRIL EVERY DAY 16 g 3  . furosemide (LASIX) 20 MG tablet Take 1 tablet (20 mg total) by mouth daily. 90 tablet 3  . Galcanezumab-gnlm (EMGALITY) 120 MG/ML SOAJ Inject 120 mg into the skin every 30 (thirty) days.    . hydrocortisone (ANUSOL-HC) 2.5 % rectal cream Place 1 application rectally 2 (two) times daily. 30 g 1  . hydroxychloroquine (PLAQUENIL) 200 MG tablet Take 2 tablets (400 mg total) by mouth every morning. 90 tablet 2  . hydrOXYzine (ATARAX/VISTARIL) 25 MG tablet Take 1 tablet (25 mg total) by mouth at bedtime as needed for itching (insomnia). 180 tablet 1  . levothyroxine (SYNTHROID) 100 MCG tablet TAKE ONE TABLET EVERY DAY 90 tablet 3  . losartan (COZAAR) 25 MG tablet Take 1 tablet (25 mg total) by mouth 2 (two) times daily. 180 tablet 3  . meloxicam (MOBIC) 7.5 MG tablet TAKE ONE TABLET BY MOUTH EVERY DAY 90 tablet 0  . nystatin-triamcinolone ointment (MYCOLOG) Apply 1 application topically 2 (two) times daily. 30 g 5  . omeprazole (PRILOSEC) 40 MG capsule Take 1 capsule (40 mg total) by mouth daily. 90 capsule 3  . OVER THE COUNTER MEDICATION Take 5 capsules by mouth daily. Ariix Optimals Vitamin & Minerals     . OVER THE COUNTER MEDICATION Take 1 scoop by mouth daily. Ariix Magnecal D     . oxyCODONE-acetaminophen (PERCOCET) 10-325 MG tablet Take 1 tablet by mouth 2 (two) times daily as needed for pain. 30 tablet 0  . spironolactone (ALDACTONE) 25 MG tablet TAKE ONE TABLET EVERY DAY 90 tablet 3  . topiramate (TOPAMAX) 25 MG tablet Take 1 tablet (25 mg total) by mouth 2 (two) times daily. 60  tablet 3  . triamcinolone (KENALOG) 0.025 % ointment Apply 1 application topically 2 (two) times daily. 454 g 0  . Wheat Dextrin (BENEFIBER) POWD Take 2 scoop by mouth daily as needed (for constipation).      No facility-administered medications prior to visit.     Allergies:   Gabapentin (once-daily), Saxenda [liraglutide -weight management], Sulfa antibiotics, Trintellix [vortioxetine], Adhesive [tape], and Codeine   Social History   Socioeconomic History  . Marital status: Married    Spouse name: Dorinda Hill  . Number of children: 2  . Years of education: Not on file  . Highest education level: Not on file  Occupational History  . Occupation: Nutritional therapist  Tobacco Use  . Smoking  status: Former Smoker    Packs/day: 2.00    Years: 12.00    Pack years: 24.00    Types: Cigarettes    Quit date: 05/06/1989    Years since quitting: 30.3  . Smokeless tobacco: Never Used  . Tobacco comment: quit smoking 1990  Substance and Sexual Activity  . Alcohol use: No    Alcohol/week: 0.0 standard drinks  . Drug use: No  . Sexual activity: Yes  Other Topics Concern  . Not on file  Social History Narrative   Former Dr. Andrey Campanile then Dr. 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 Difficulty of Paying Living Expenses:   Food Insecurity:   . Worried About Programme researcher, broadcasting/film/video in the Last Year:   . Barista in the Last Year:   Transportation Needs:   . Freight forwarder (Medical):   Marland Kitchen Lack of Transportation (Non-Medical):   Physical Activity:   . Days of Exercise per Week:   . Minutes of Exercise per Session:   Stress:   . Feeling of Stress :   Social Connections:   . Frequency of Communication with Friends and Family:   . Frequency of Social Gatherings with Friends and Family:   . Attends Religious Services:   . Active Member of Clubs or Organizations:   . Attends Banker Meetings:     Marland Kitchen Marital Status:      Family History:  The patient's family history includes Alcoholism in her father; Depression in her mother; Diabetes in her father; Glaucoma in her father; Heart disease in her father; High blood pressure in her father; Kidney failure in her brother; Obesity in her father and mother; Pancreatic cancer in her mother; Thyroid cancer in her father and mother.   ROS:   Please see the history of present illness.    ROS All other systems reviewed and are negative.   PHYSICAL EXAM:   VS:  BP 98/86   Pulse 91   Ht 5\' 3"  (1.6 m)   Wt 232 lb 9.6 oz (105.5 kg)   SpO2 100%   BMI 41.20 kg/m    GEN: Well nourished, well developed, in no acute distress  HEENT: normal  Neck: no JVD, carotid bruits, or masses Cardiac: RRR; no murmurs, rubs, or gallops,no edema  Respiratory:  clear to auscultation bilaterally, normal work of breathing GI: soft, nontender, nondistended, + BS MS: no deformity or atrophy  Skin: warm and dry, no rash Neuro:  Alert and Oriented x 3, Strength and sensation are intact Psych: euthymic mood, full affect  Wt Readings from Last 3 Encounters:  09/06/19 232 lb 9.6 oz (105.5 kg)  09/01/19 230 lb (104.3 kg)  08/17/19 234 lb (106.1 kg)      Studies/Labs Reviewed:   EKG:  EKG is not ordered today.    Recent Labs: 02/03/2019: Magnesium 1.9 06/01/2019: TSH 3.86 06/11/2019: ALT 24; BUN 10; Creatinine, Ser 0.80; Hemoglobin 12.8; Platelets 229.0; Potassium 3.9; Sodium 140   Lipid Panel    Component Value Date/Time   CHOL 170 02/03/2019 1429   TRIG 132.0 02/03/2019 1429   HDL 50.20 02/03/2019 1429   CHOLHDL 3 02/03/2019 1429   VLDL 26.4 02/03/2019 1429   LDLCALC 93 02/03/2019 1429   LDLDIRECT 75.0 03/20/2016 0836    Additional studies/ records that were reviewed today include:   Echo 06/28/2016 LV EF: 40% -  45% Study Conclusions  - Left ventricle: The cavity size was mildly dilated. Wall   thickness was increased in a pattern of mild LVH.  Systolic   function was mildly to moderately reduced. The estimated ejection   fraction was in the range of 40% to 45%. Diffuse hypokinesis.   Doppler parameters are consistent with abnormal left ventricular   relaxation (grade 1 diastolic dysfunction). - Left atrium: The atrium was mildly dilated.  Impressions:  - Limited study to assess LV function; full doppler study not   performed; mild to moderate global reduction in LV systolic   function; grade 1 diastolic dysfunction; mild LVH; mild LVE; mild   LAE.   Cath 07/11/2016 Conclusion     There is moderate left ventricular systolic dysfunction.  The left ventricular ejection fraction is 35-45% by visual estimate.  LV end diastolic pressure is mildly elevated.  1. Normal coronary anatomy 2. Moderate LV dysfunction- global. EF 35-40%.  3. Mildly elevated LVEDP 4. Normal pulmonary pressures. RA pressure is elevated. 5. Normal cardiac output.  Plan: continue medical therapy. Consider addition of aldactone to current therapy      ASSESSMENT:    1. Chronic systolic heart failure (HCC)   2. NICM (nonischemic cardiomyopathy) (HCC)   3. PVC's (premature ventricular contractions)      PLAN:  In order of problems listed above:  1. Chronic systolic CHF with Nonischemic cardiomyopathy: EF 35 to 40% on previous cardiac catheterization.  Continue carvedilol, losartan and spironolactone. She appears to be well compensated.   2. Hypertension: Blood pressure is low on meds.  3. Hypothyroidism: Managed by primary care provider.  4. PVCs: this is well controlled on carvedilol.  5.   Obesity. She is very motivated to lose weight.    Medication Adjustments/Labs and Tests Ordered: Current medicines are reviewed at length with the patient today.  Concerns regarding medicines are outlined above.  Medication changes, Labs and Tests ordered today are listed in the Patient Instructions below. There are no Patient Instructions  on file for this visit.   Signed, Deshunda Thackston Swazil[place], MD  09/06/2019 12:03 PM    [facility] [address] [place], [place], [place]  [address] Phone: [phone]; Fax: [phone]

## 2019-09-02 NOTE — Progress Notes (Signed)
Chief Complaint:   OBESITY Kristine Curtis is here to discuss her progress with her obesity treatment plan along with follow-up of her obesity related diagnoses. Kristine Curtis is on the Category 3 Plan and states she is following her eating plan approximately 0% of the time. Kristine Curtis states she is walking for 30 minutes 3-5 times per week.  Today's visit was #: 7 Starting weight: 226 lbs Starting date: 06/21/2019 Today's weight: 230 lbs Today's date: 09/01/2019 Total lbs lost to date: 0 Total lbs lost since last in-office visit: 1 lb  Interim History: Kristine Curtis is doing well with intuitive eating.  Assessment/Plan:   1. Other irritable bowel syndrome Current treatment plan is effective, no change in therapy. Orders and follow up as documented in patient record.  2. Fibromyalgia Intensive lifestyle modifications are the first line treatment for this issue. Kristine Curtis has alreadyWe discussed several lifestyle modifications today and she will continue to work on diet, exercise and weight loss efforts.We will continue to monitor. Orders and follow up as documented in patient record.   Counseling . Try DeathPrevention.it, which is a series of self-care modules designed to teach patients several techniques to manage pain.   3. Emotional eating  Behavior modification techniques were discussed today to help Kristine Curtis deal with her emotional/non-hunger eating behaviors.  She has been doing amazingly well on her own - using the Intuitive Eating Workbook to learn how to listen to hunger cues and have a healthier relationship with food. We discussed other potential resources, including Kristine Curtis podcasts and [employer].   4. Class 3 severe obesity with serious comorbidity and body mass index (BMI) of 40.0 to 44.9 in adult, unspecified obesity type [facility]) [facility] is currently in the action stage of change. As such, her goal is to continue with weight loss efforts. She has agreed to practicing portion  control and making smarter food choices, such as increasing vegetables and decreasing simple carbohydrates.   Exercise goals: PT.  Behavioral modification strategies: emotional eating strategies.  Preston has agreed to follow-up with our clinic in 2 weeks. She was informed of the importance of frequent follow-up visits to maximize her success with intensive lifestyle modifications for her multiple health conditions.   Objective:   Blood pressure 121/73, pulse 91, temperature 99 F (37.2 C), temperature source Oral, height 5\' 3"  (1.6 m), weight 230 lb (104.3 kg), SpO2 95 %. Body mass index is 40.74 kg/m.  General: Cooperative, alert, well developed, in no acute distress. HEENT: Conjunctivae and lids unremarkable. Cardiovascular: Regular rhythm.  Lungs: Normal work of breathing. Neurologic: No focal deficits.   Lab Results  Component Value Date   CREATININE 0.80 06/11/2019   BUN 10 06/11/2019   NA 140 06/11/2019   K 3.9 06/11/2019   CL 108 06/11/2019   CO2 25 06/11/2019   Lab Results  Component Value Date   ALT 24 06/11/2019   AST 22 06/11/2019   ALKPHOS 90 06/11/2019   BILITOT 0.4 06/11/2019   Lab Results  Component Value Date   HGBA1C 4.8 06/21/2019   HGBA1C 4.8 02/03/2019   HGBA1C 4.9 03/20/2016   HGBA1C 5.2 11/21/2014   Lab Results  Component Value Date   INSULIN 31.4 (H) 06/21/2019   Lab Results  Component Value Date   TSH 3.86 06/01/2019   Lab Results  Component Value Date   CHOL 170 02/03/2019   HDL 50.20 02/03/2019   LDLCALC 93 02/03/2019   LDLDIRECT 75.0 03/20/2016   TRIG 132.0 02/03/2019  CHOLHDL 3 02/03/2019   Lab Results  Component Value Date   WBC 5.5 06/11/2019   HGB 12.8 06/11/2019   HCT 38.1 06/11/2019   MCV 95.8 06/11/2019   PLT 229.0 06/11/2019   Lab Results  Component Value Date   IRON 41 (L) 11/17/2008   FERRITIN 25.1 11/17/2008   Attestation Statements:   Reviewed by clinician on day of visit: allergies, medications,  problem list, medical history, surgical history, family history, social history, and previous encounter notes.  I, Water quality scientist, CMA, am acting as Location manager for [employer], DO.  I have reviewed the above documentation for accuracy and completeness, and I agree with the above. Briscoe Deutscher, DO

## 2019-09-06 ENCOUNTER — Other Ambulatory Visit: Payer: Self-pay

## 2019-09-06 ENCOUNTER — Encounter: Payer: Self-pay | Admitting: Cardiology

## 2019-09-06 ENCOUNTER — Ambulatory Visit (INDEPENDENT_AMBULATORY_CARE_PROVIDER_SITE_OTHER): Payer: Medicare Other | Admitting: Cardiology

## 2019-09-06 VITALS — BP 98/86 | HR 91 | Ht 63.0 in | Wt 232.6 lb

## 2019-09-06 DIAGNOSIS — I428 Other cardiomyopathies: Secondary | ICD-10-CM | POA: Diagnosis not present

## 2019-09-06 DIAGNOSIS — I5022 Chronic systolic (congestive) heart failure: Secondary | ICD-10-CM | POA: Diagnosis not present

## 2019-09-06 DIAGNOSIS — I493 Ventricular premature depolarization: Secondary | ICD-10-CM

## 2019-09-16 ENCOUNTER — Ambulatory Visit: Payer: Medicare Other | Admitting: Internal Medicine

## 2019-09-21 ENCOUNTER — Other Ambulatory Visit: Payer: Self-pay | Admitting: Family Medicine

## 2019-09-21 DIAGNOSIS — G8929 Other chronic pain: Secondary | ICD-10-CM

## 2019-09-27 ENCOUNTER — Other Ambulatory Visit: Payer: Self-pay

## 2019-09-27 ENCOUNTER — Encounter (INDEPENDENT_AMBULATORY_CARE_PROVIDER_SITE_OTHER): Payer: Self-pay | Admitting: Family Medicine

## 2019-09-27 ENCOUNTER — Ambulatory Visit (INDEPENDENT_AMBULATORY_CARE_PROVIDER_SITE_OTHER): Payer: Medicare Other | Admitting: Family Medicine

## 2019-09-27 VITALS — BP 121/81 | HR 93 | Temp 98.7°F | Ht 63.0 in | Wt 230.0 lb

## 2019-09-27 DIAGNOSIS — F3289 Other specified depressive episodes: Secondary | ICD-10-CM

## 2019-09-27 DIAGNOSIS — M797 Fibromyalgia: Secondary | ICD-10-CM

## 2019-09-27 DIAGNOSIS — Z6841 Body Mass Index (BMI) 40.0 and over, adult: Secondary | ICD-10-CM | POA: Diagnosis not present

## 2019-09-27 NOTE — Progress Notes (Signed)
Chief Complaint:   OBESITY Kristine Curtis is here to discuss her progress with her obesity treatment plan along with follow-up of her obesity related diagnoses. Kristine Curtis is practicing intuitive eating and states she is following her eating plan approximately 100% of the time. Kristine Curtis states she is doing motion in movement for 20-35 minutes 7 times per week.  Today's visit was #: 7 Starting weight: 226 lbs Starting date: 06/21/2019 Today's weight: 230 lbs Today's date: 09/27/2019 Total lbs lost to date: 0 Total lbs lost since last in-office visit: 0  Interim History: Kristine Curtis is working through intuitive eating vs. planning meals for nourishment and weight loss.  She is now part of a FB group that is supportive.  Assessment/Plan:   1. Fibromyalgia Kristine Curtis says she is enjoying exercising and stretching again.  Intensive lifestyle modifications are the first line treatment for this issue. We discussed several lifestyle modifications today and she will continue to work on diet, exercise and weight loss efforts.We will continue to monitor. Orders and follow up as documented in patient record.   Counseling . Try DeathPrevention.it, which is a series of self-care modules designed to teach patients several techniques to manage pain.   2. Other depression, with emotional eating  Behavior modification techniques were discussed today to help Kristine Curtis deal with her emotional/non-hunger eating behaviors.  Orders and follow up as documented in patient record.   3. Class 3 severe obesity with serious comorbidity and body mass index (BMI) of 40.0 to 44.9 in adult, unspecified obesity type [facility]) Kristine Curtis is currently in the action stage of change. As such, her goal is to continue with weight loss efforts. She has agreed to intuitive eating.   Exercise goals: motion in movement.  Behavioral modification strategies: keeping healthy foods in the home.  Kristine Curtis has agreed to follow-up with our clinic  in 2 weeks. She was informed of the importance of frequent follow-up visits to maximize her success with intensive lifestyle modifications for her multiple health conditions.   Objective:   Blood pressure 121/81, pulse 93, temperature 98.7 F (37.1 C), temperature source Oral, height 5\' 3"  (1.6 m), weight 230 lb (104.3 kg), SpO2 96 %. Body mass index is 40.74 kg/m.  General: Cooperative, alert, well developed, in no acute distress. HEENT: Conjunctivae and lids unremarkable. Cardiovascular: Regular rhythm.  Lungs: Normal work of breathing. Neurologic: No focal deficits.   Lab Results  Component Value Date   CREATININE 0.80 06/11/2019   BUN 10 06/11/2019   NA 140 06/11/2019   K 3.9 06/11/2019   CL 108 06/11/2019   CO2 25 06/11/2019   Lab Results  Component Value Date   ALT 24 06/11/2019   AST 22 06/11/2019   ALKPHOS 90 06/11/2019   BILITOT 0.4 06/11/2019   Lab Results  Component Value Date   HGBA1C 4.8 06/21/2019   HGBA1C 4.8 02/03/2019   HGBA1C 4.9 03/20/2016   HGBA1C 5.2 11/21/2014   Lab Results  Component Value Date   INSULIN 31.4 (H) 06/21/2019   Lab Results  Component Value Date   TSH 3.86 06/01/2019   Lab Results  Component Value Date   CHOL 170 02/03/2019   HDL 50.20 02/03/2019   LDLCALC 93 02/03/2019   LDLDIRECT 75.0 03/20/2016   TRIG 132.0 02/03/2019   CHOLHDL 3 02/03/2019   Lab Results  Component Value Date   WBC 5.5 06/11/2019   HGB 12.8 06/11/2019   HCT 38.1 06/11/2019   MCV 95.8 06/11/2019   PLT 229.0 06/11/2019  Lab Results  Component Value Date   IRON 41 (L) 11/17/2008   FERRITIN 25.1 11/17/2008   Attestation Statements:   Reviewed by clinician on day of visit: allergies, medications, problem list, medical history, surgical history, family history, social history, and previous encounter notes.  Time spent on visit including pre-visit chart review and post-visit care and charting was 30 minutes.   I, Water quality scientist, CMA, am acting as  Location manager for [employer], DO.  I have reviewed the above documentation for accuracy and completeness, and I agree with the above. Briscoe Deutscher, DO

## 2019-09-29 ENCOUNTER — Encounter (INDEPENDENT_AMBULATORY_CARE_PROVIDER_SITE_OTHER): Payer: Self-pay | Admitting: Family Medicine

## 2019-09-30 NOTE — Telephone Encounter (Signed)
FYI

## 2019-10-15 ENCOUNTER — Ambulatory Visit (INDEPENDENT_AMBULATORY_CARE_PROVIDER_SITE_OTHER): Payer: Medicare Other | Admitting: Internal Medicine

## 2019-10-15 ENCOUNTER — Encounter: Payer: Self-pay | Admitting: Internal Medicine

## 2019-10-15 VITALS — BP 94/60 | HR 83 | Ht 63.0 in | Wt 233.0 lb

## 2019-10-15 DIAGNOSIS — K580 Irritable bowel syndrome with diarrhea: Secondary | ICD-10-CM

## 2019-10-15 DIAGNOSIS — K52832 Lymphocytic colitis: Secondary | ICD-10-CM

## 2019-10-15 NOTE — Progress Notes (Signed)
Kristine Curtis 59 y.o. 1961/02/15 [id number]  Assessment & Plan:   Encounter Diagnoses  Name Primary?  . Lymphocytic colitis Yes  . Irritable bowel syndrome with diarrhea     Continue 3 mg daily budesonide as she feels much better on this.  I think she could try to stop it and see but she is convinced it is making a difference at this point and it may be.  Potential steroid side effects discussed with the patient.  Less likely on a very low dose of budesonide but cataracts, hypertension, bone loss with osteoporosis and fractures discussed.  Return in 6 months sooner if needed.  CC: Willow Ora, MD   Subjective:   Chief Complaint: Follow-up of lymphocytic colitis  HPI Kristine Curtis returns, she was seen 2 months ago and was better but not happy with her quality of life with respect diarrhea and bowel habit changes on 9 mg of budesonide so we continued it.  However in the interim she has been able to reduce to 3 mg of budesonide and have fairly regular bowel habits.  She said at 1 point on the 9 mg she is becoming constipated again and this made her reduce the dose. She will occasionally have some diarrhea.  Overall mostly does well.  However certain foods may bother her for example,oday had waffle w/ syrup - and then had immediate feeling of unwell and had 4 episodes of diarrhea  Doing intuitive eating program and trying to learn about how her emotions influence what she eats and become more in control of her eating.  She is very happy with this breakthrough.  She has tended to be on a low-carb diet overall.   Wt Readings from Last 3 Encounters:  10/15/19 233 lb (105.7 kg)  09/27/19 230 lb (104.3 kg)  09/06/19 232 lb 9.6 oz (105.5 kg)    Allergies  Allergen Reactions  . Gabapentin (Once-Daily)     Depression   . Saxenda [Liraglutide -Weight Management] Other (See Comments)    Depression   . Sulfa Antibiotics     itching  . Trintellix [Vortioxetine] Other (See Comments)      GI issues   . Adhesive [Tape] Rash  . Codeine Itching   Current Meds  Medication Sig  . budesonide (ENTOCORT EC) 3 MG 24 hr capsule Take 3 mg by mouth daily.  Marland Kitchen buPROPion (WELLBUTRIN XL) 300 MG 24 hr tablet TAKE 1 TABLET BY MOUTH DAILY  . carvedilol (COREG) 12.5 MG tablet Take 1 tablet (12.5 mg total) by mouth 2 (two) times daily with a meal.  . Cholecalciferol (VITAMIN D-3) 25 MCG (1000 UT) CAPS Take 1,000 Units by mouth daily.   . cyclobenzaprine (FLEXERIL) 5 MG tablet Take 1 tablet by mouth daily.  . diazepam (VALIUM) 5 MG tablet Take 1 tablet (5 mg total) by mouth daily as needed for anxiety.  . Estradiol-Progesterone 1-100 MG CAPS Take 1 capsule by mouth at bedtime.  . fluticasone (FLONASE) 50 MCG/ACT nasal spray 1 SPRAY IN EACH NOSTRIL EVERY DAY  . furosemide (LASIX) 20 MG tablet Take 1 tablet (20 mg total) by mouth daily.  . Galcanezumab-gnlm (EMGALITY) 120 MG/ML SOAJ Inject 120 mg into the skin every 30 (thirty) days.  . hydrocortisone (ANUSOL-HC) 2.5 % rectal cream Place 1 application rectally 2 (two) times daily.  . hydroxychloroquine (PLAQUENIL) 200 MG tablet Take 2 tablets (400 mg total) by mouth every morning.  . hydrOXYzine (ATARAX/VISTARIL) 25 MG tablet Take 1 tablet (25 mg  total) by mouth at bedtime as needed for itching (insomnia).  Marland Kitchen levothyroxine (SYNTHROID) 100 MCG tablet TAKE ONE TABLET EVERY DAY  . losartan (COZAAR) 25 MG tablet Take 1 tablet (25 mg total) by mouth 2 (two) times daily.  . meloxicam (MOBIC) 7.5 MG tablet TAKE ONE TABLET BY MOUTH EVERY DAY  . nystatin-triamcinolone ointment (MYCOLOG) Apply 1 application topically 2 (two) times daily.  Marland Kitchen omeprazole (PRILOSEC) 40 MG capsule Take 1 capsule (40 mg total) by mouth daily.  Marland Kitchen OVER THE COUNTER MEDICATION Take 5 capsules by mouth daily. [employer] Optimals Vitamin & Minerals   . OVER THE COUNTER MEDICATION Take 1 scoop by mouth daily. Ariix Magnecal D   . oxyCODONE-acetaminophen (PERCOCET) 10-325 MG tablet Take 1  tablet by mouth 2 (two) times daily as needed for pain.  Marland Kitchen spironolactone (ALDACTONE) 25 MG tablet TAKE ONE TABLET EVERY DAY  . topiramate (TOPAMAX) 25 MG tablet Take 1 tablet (25 mg total) by mouth 2 (two) times daily.  Marland Kitchen triamcinolone (KENALOG) 0.025 % ointment Apply 1 application topically 2 (two) times daily.  . Wheat Dextrin (BENEFIBER) POWD Take 2 scoop by mouth daily as needed (for constipation).    Past Medical History:  Diagnosis Date  . Allergic rhinitis due to pollen   . Anxiety   . Asthma   . Carpal tunnel syndrome of right wrist 12/22/2017  . Chronic combined systolic and diastolic CHF (congestive heart failure) (HCC)    06/2016 Echo: EF 40-45%, Gr1 DD  . Chronic fatigue   . Depression   . Family history of polycystic kidney with negative CT 11/16/2012  . Fibromyalgia   . GERD (gastroesophageal reflux disease)   . Hypertension   . Hypothyroidism   . IBS (irritable bowel syndrome)   . Internal hemorrhoids   . Lymphocytic colitis   . Morbid obesity (HCC)   . NICM (nonischemic cardiomyopathy) (HCC)    a. 1999 Cath: nl cors;  b. EF prev as low as 35%;  c. 11/2015 Echo: Ef 40-45%;  d. 06/2016 Echo: EF 40-45%;  e. Lexiscan MV: fixed anterosepta/inferseptal defect w/o ischemia, EF 35%.  . Osteoarthritis   . PONV (postoperative nausea and vomiting)   . PVC (premature ventricular contraction)   . Restless leg syndrome   . Sjogren's syndrome (HCC)   . Vertigo    Past Surgical History:  Procedure Laterality Date  . ABDOMINAL HYSTERECTOMY    . CESAREAN SECTION    . CHOLECYSTECTOMY  10-2008  . COLONOSCOPY  02/14/2015  . ESOPHAGOGASTRODUODENOSCOPY    . EYE SURGERY     2 2013 and 1981/DCR of right eye 05/2014  . LUMBAR DISC SURGERY  2016   L3-4  . RIGHT/LEFT HEART CATH AND CORONARY ANGIOGRAPHY N/A 07/11/2016   Procedure: Right/Left Heart Cath and Coronary Angiography;  Surgeon: Peter M Swaziland, MD;  Location: [facility]ASIVE [facility];  Service: Cardiovascular;  Laterality: N/A;  .  SHOULDER ARTHROSCOPY W/ SUBACROMIAL DECOMPRESSION AND DISTAL CLAVICLE EXCISION Right   . SPINE SURGERY     C6-C7, 03/2017  . 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   Review of Systems As per HPI  Objective:   Physical Exam BP 94/60 (BP Location: Right Arm, Patient Position: Sitting, Cuff Size: Large)   Pulse 83   Ht 5\' 3"  (1.6 m)   Wt 233 lb (105.7 kg)   SpO2 97%   BMI 41.27 kg/m  No acute distress  21 minutes total time

## 2019-10-15 NOTE — Patient Instructions (Signed)
Continue your 3mg  of Budesonide daily.   Follow up in 6 months or sooner if needed.    I appreciate the opportunity to care for you. , MD,FACG

## 2019-10-21 ENCOUNTER — Other Ambulatory Visit: Payer: Self-pay

## 2019-10-21 ENCOUNTER — Ambulatory Visit (INDEPENDENT_AMBULATORY_CARE_PROVIDER_SITE_OTHER): Payer: Medicare Other | Admitting: Family Medicine

## 2019-10-21 ENCOUNTER — Encounter (INDEPENDENT_AMBULATORY_CARE_PROVIDER_SITE_OTHER): Payer: Self-pay | Admitting: Family Medicine

## 2019-10-21 VITALS — BP 121/82 | HR 85 | Temp 98.0°F | Ht 63.0 in | Wt 230.0 lb

## 2019-10-21 DIAGNOSIS — Z6841 Body Mass Index (BMI) 40.0 and over, adult: Secondary | ICD-10-CM | POA: Diagnosis not present

## 2019-10-21 DIAGNOSIS — F3289 Other specified depressive episodes: Secondary | ICD-10-CM

## 2019-10-25 NOTE — Progress Notes (Signed)
Chief Complaint:   OBESITY Kristine Curtis is here to discuss her progress with her obesity treatment plan along with follow-up of her obesity related diagnoses. Kristine Curtis is practicing intuitive eating and states she is following her eating plan approximately 100% of the time. Kristine Curtis states she is strength training, stretching, and walking for 30-45 minutes 7 times per week.  Today's visit was #: 8 Starting weight: 226 lbs Starting date: 06/21/2019 Today's weight: 230 lbs Today's date: 10/21/2019 Total lbs lost to date: 0 Total lbs lost since last in-office visit: 0  Interim History: Kristine Curtis says she is happy with her progress of IE and ready to start adding healthy food habits.  She would like to review calorie/protein goals!  Subjective:   1. Other depression, with emotional eating  Kristine Curtis is doing well recognizing hunger cues and making healthier choices.  She will now focus on proactively nourishing her body.  Assessment/Plan:   1. Other depression, with emotional eating  Behavior modification techniques were discussed today to help Kristine Curtis deal with her emotional/non-hunger eating behaviors.  Orders and follow up as documented in patient record.  Category diet and breakdown of calories/protein reviewed.  2. Class 3 severe obesity with serious comorbidity and body mass index (BMI) of 40.0 to 44.9 in adult, unspecified obesity type Kristine Curtis) Kristine Curtis is currently in the action stage of change. As such, her goal is to continue with weight loss efforts. She has agreed to keeping a food journal and adhering to recommended goals of 1200-1500 calories and 95+ grams of protein.   Exercise goals: For substantial health benefits, adults should do at least 150 minutes (2 hours and 30 minutes) a week of moderate-intensity, or 75 minutes (1 hour and 15 minutes) a week of vigorous-intensity aerobic physical activity, or an equivalent combination of moderate- and vigorous-intensity aerobic activity. Aerobic  activity should be performed in episodes of at least 10 minutes, and preferably, it should be spread throughout the week.  Behavioral modification strategies: increasing lean protein intake, decreasing simple carbohydrates, meal planning and cooking strategies and emotional eating strategies.  Kristine Curtis has agreed to follow-up with our clinic in 2-3 weeks. She was informed of the importance of frequent follow-up visits to maximize her success with intensive lifestyle modifications for her multiple health conditions.   Objective:   Blood pressure 121/82, pulse 85, temperature 98 F (36.7 C), temperature source Oral, height 5\' 3"  (1.6 m), weight 230 lb (104.3 kg), SpO2 98 %. Body mass index is 40.74 kg/m.  General: Cooperative, alert, well developed, in no acute distress. HEENT: Conjunctivae and lids unremarkable. Cardiovascular: Regular rhythm.  Lungs: Normal work of breathing. Neurologic: No focal deficits.   Lab Results  Component Value Date   CREATININE 0.80 06/11/2019   BUN 10 06/11/2019   NA 140 06/11/2019   K 3.9 06/11/2019   CL 108 06/11/2019   CO2 25 06/11/2019   Lab Results  Component Value Date   ALT 24 06/11/2019   AST 22 06/11/2019   ALKPHOS 90 06/11/2019   BILITOT 0.4 06/11/2019   Lab Results  Component Value Date   HGBA1C 4.8 06/21/2019   HGBA1C 4.8 02/03/2019   HGBA1C 4.9 03/20/2016   HGBA1C 5.2 11/21/2014   Lab Results  Component Value Date   INSULIN 31.4 (H) 06/21/2019   Lab Results  Component Value Date   TSH 3.86 06/01/2019   Lab Results  Component Value Date   CHOL 170 02/03/2019   HDL 50.20 02/03/2019   LDLCALC 93  02/03/2019   LDLDIRECT 75.0 03/20/2016   TRIG 132.0 02/03/2019   CHOLHDL 3 02/03/2019   Lab Results  Component Value Date   WBC 5.5 06/11/2019   HGB 12.8 06/11/2019   HCT 38.1 06/11/2019   MCV 95.8 06/11/2019   PLT 229.0 06/11/2019   Lab Results  Component Value Date   IRON 41 (L) 11/17/2008   FERRITIN 25.1 11/17/2008    Attestation Statements:   Reviewed by clinician on day of visit: allergies, medications, problem list, medical history, surgical history, family history, social history, and previous encounter notes.  Time spent on visit including pre-visit chart review and post-visit care and charting was 25 minutes.   I, Water quality scientist, CMA, am acting as transcriptionist for Briscoe Deutscher, DO  I have reviewed the above documentation for accuracy and completeness, and I agree with the above. Briscoe Deutscher, DO

## 2019-11-18 ENCOUNTER — Ambulatory Visit (INDEPENDENT_AMBULATORY_CARE_PROVIDER_SITE_OTHER): Payer: Medicare Other | Admitting: Family Medicine

## 2019-12-06 ENCOUNTER — Ambulatory Visit (INDEPENDENT_AMBULATORY_CARE_PROVIDER_SITE_OTHER): Payer: Medicare Other

## 2019-12-06 DIAGNOSIS — Z0000 Encounter for general adult medical examination without abnormal findings: Secondary | ICD-10-CM

## 2019-12-06 NOTE — Patient Instructions (Signed)
Kristine Curtis , Thank you for taking time to come for your Medicare Wellness Visit. I appreciate your ongoing commitment to your health goals. Please review the following plan we discussed and let me know if I can assist you in the future.   Screening recommendations/referrals: Colonoscopy: Done 06/29/19 Mammogram: Done 05/05/19 Bone Density: N/A Recommended yearly ophthalmology/optometry visit for glaucoma screening and checkup Recommended yearly dental visit for hygiene and checkup  Vaccinations: Influenza vaccine: Up to date Pneumococcal vaccine: N/A Tdap vaccine: Up to date Shingles vaccine: Shingrix discussed. Please contact your pharmacy for coverage information.   Covid-19: Declined   Advanced directives: Please bring a copy of your health care power of attorney and living will to the office at your convenience.  Conditions/risks identified: Lose weight and follow a routine regimen to do so  Next appointment: Follow up in one year for your annual wellness visit.   Preventive Care 40-64 Years, Female Preventive care refers to lifestyle choices and visits with your health care provider that can promote health and wellness. What does preventive care include?  A yearly physical exam. This is also called an annual well check.  Dental exams once or twice a year.  Routine eye exams. Ask your health care provider how often you should have your eyes checked.  Personal lifestyle choices, including:  Daily care of your teeth and gums.  Regular physical activity.  Eating a healthy diet.  Avoiding tobacco and drug use.  Limiting alcohol use.  Practicing safe sex.  Taking low-dose aspirin daily starting at age 68.  Taking vitamin and mineral supplements as recommended by your health care provider. What happens during an annual well check? The services and screenings done by your health care provider during your annual well check will depend on your age, overall health,  lifestyle risk factors, and family history of disease. Counseling  Your health care provider may ask you questions about your:  Alcohol use.  Tobacco use.  Drug use.  Emotional well-being.  Home and relationship well-being.  Sexual activity.  Eating habits.  Work and work Statistician.  Method of birth control.  Menstrual cycle.  Pregnancy history. Screening  You may have the following tests or measurements:  Height, weight, and BMI.  Blood pressure.  Lipid and cholesterol levels. These may be checked every 5 years, or more frequently if you are over 75 years old.  Skin check.  Lung cancer screening. You may have this screening every year starting at age 91 if you have a 30-pack-year history of smoking and currently smoke or have quit within the past 15 years.  Fecal occult blood test (FOBT) of the stool. You may have this test every year starting at age 55.  Flexible sigmoidoscopy or colonoscopy. You may have a sigmoidoscopy every 5 years or a colonoscopy every 10 years starting at age 74.  Hepatitis C blood test.  Hepatitis B blood test.  Sexually transmitted disease (STD) testing.  Diabetes screening. This is done by checking your blood sugar (glucose) after you have not eaten for a while (fasting). You may have this done every 1-3 years.  Mammogram. This may be done every 1-2 years. Talk to your health care provider about when you should start having regular mammograms. This may depend on whether you have a family history of breast cancer.  BRCA-related cancer screening. This may be done if you have a family history of breast, ovarian, tubal, or peritoneal cancers.  Pelvic exam and Pap test. This may be  done every 3 years starting at age 28. Starting at age 36, this may be done every 5 years if you have a Pap test in combination with an HPV test.  Bone density scan. This is done to screen for osteoporosis. You may have this scan if you are at high risk for  osteoporosis. Discuss your test results, treatment options, and if necessary, the need for more tests with your health care provider. Vaccines  Your health care provider may recommend certain vaccines, such as:  Influenza vaccine. This is recommended every year.  Tetanus, diphtheria, and acellular pertussis (Tdap, Td) vaccine. You may need a Td booster every 10 years.  Zoster vaccine. You may need this after age 37.  Pneumococcal 13-valent conjugate (PCV13) vaccine. You may need this if you have certain conditions and were not previously vaccinated.  Pneumococcal polysaccharide (PPSV23) vaccine. You may need one or two doses if you smoke cigarettes or if you have certain conditions. Talk to your health care provider about which screenings and vaccines you need and how often you need them. This information is not intended to replace advice given to you by your health care provider. Make sure you discuss any questions you have with your health care provider. Document Released: 05/19/2015 Document Revised: 01/10/2016 Document Reviewed: 02/21/2015 Elsevier Interactive Patient Education  2017 [place] Prevention in the Home Falls can cause injuries. They can happen to people of all ages. There are many things you can do to make your home safe and to help prevent falls. What can I do on the outside of my home?  Regularly fix the edges of walkways and driveways and fix any cracks.  Remove anything that might make you trip as you walk through a door, such as a raised step or threshold.  Trim any bushes or trees on the path to your home.  Use bright outdoor lighting.  Clear any walking paths of anything that might make someone trip, such as rocks or tools.  Regularly check to see if handrails are loose or broken. Make sure that both sides of any steps have handrails.  Any raised decks and porches should have guardrails on the edges.  Have any leaves, snow, or ice cleared  regularly.  Use sand or salt on walking paths during winter.  Clean up any spills in your garage right away. This includes oil or grease spills. What can I do in the bathroom?  Use night lights.  Install grab bars by the toilet and in the tub and shower. Do not use towel bars as grab bars.  Use non-skid mats or decals in the tub or shower.  If you need to sit down in the shower, use a plastic, non-slip stool.  Keep the floor dry. Clean up any water that spills on the floor as soon as it happens.  Remove soap buildup in the tub or shower regularly.  Attach bath mats securely with double-sided non-slip rug tape.  Do not have throw rugs and other things on the floor that can make you trip. What can I do in the bedroom?  Use night lights.  Make sure that you have a light by your bed that is easy to reach.  Do not use any sheets or blankets that are too big for your bed. They should not hang down onto the floor.  Have a firm chair that has side arms. You can use this for support while you get dressed.  Do not  have throw rugs and other things on the floor that can make you trip. What can I do in the kitchen?  Clean up any spills right away.  Avoid walking on wet floors.  Keep items that you use a lot in easy-to-reach places.  If you need to reach something above you, use a strong step stool that has a grab bar.  Keep electrical cords out of the way.  Do not use floor polish or wax that makes floors slippery. If you must use wax, use non-skid floor wax.  Do not have throw rugs and other things on the floor that can make you trip. What can I do with my stairs?  Do not leave any items on the stairs.  Make sure that there are handrails on both sides of the stairs and use them. Fix handrails that are broken or loose. Make sure that handrails are as long as the stairways.  Check any carpeting to make sure that it is firmly attached to the stairs. Fix any carpet that is loose  or worn.  Avoid having throw rugs at the top or bottom of the stairs. If you do have throw rugs, attach them to the floor with carpet tape.  Make sure that you have a light switch at the top of the stairs and the bottom of the stairs. If you do not have them, ask someone to add them for you. What else can I do to help prevent falls?  Wear shoes that:  Do not have high heels.  Have rubber bottoms.  Are comfortable and fit you well.  Are closed at the toe. Do not wear sandals.  If you use a stepladder:  Make sure that it is fully opened. Do not climb a closed stepladder.  Make sure that both sides of the stepladder are locked into place.  Ask someone to hold it for you, if possible.  Clearly mark and make sure that you can see:  Any grab bars or handrails.  First and last steps.  Where the edge of each step is.  Use tools that help you move around (mobility aids) if they are needed. These include:  Canes.  Walkers.  Scooters.  Crutches.  Turn on the lights when you go into a dark area. Replace any light bulbs as soon as they burn out.  Set up your furniture so you have a clear path. Avoid moving your furniture around.  If any of your floors are uneven, fix them.  If there are any pets around you, be aware of where they are.  Review your medicines with your doctor. Some medicines can make you feel dizzy. This can increase your chance of falling. Ask your doctor what other things that you can do to help prevent falls. This information is not intended to replace advice given to you by your health care provider. Make sure you discuss any questions you have with your health care provider. Document Released: 02/16/2009 Document Revised: 09/28/2015 Document Reviewed: 05/27/2014 Elsevier Interactive Patient Education  2017 [employer].

## 2019-12-06 NOTE — Progress Notes (Signed)
Virtual Visit via Telephone Note  I connected with  Kristine Curtis on 12/06/19 at 11:45 AM EDT by telephone and verified that I am speaking with the correct person using two identifiers.  Medicare Annual Wellness visit completed telephonically due to Covid-19 pandemic.   Persons participating in this call: This Health Coach and this patient.   Location: Patient: Home Provider: Office   I discussed the limitations, risks, security and privacy concerns of performing an evaluation and management service by telephone and the availability of in person appointments. The patient expressed understanding and agreed to proceed.  Unable to perform video visit due to video visit attempted and failed and/or patient does not have video capability.   Some vital signs may be absent or patient reported.   Marzella Schlein, LPN    Subjective:   Kristine Curtis is a 59 y.o. female who presents for an Initial Medicare Annual Wellness Visit.  Review of Systems           Objective:    There were no vitals filed for this visit. There is no height or weight on file to calculate BMI.  Advanced Directives 12/06/2019 01/14/2017 07/11/2016 06/26/2016 06/25/2016 02/14/2015 10/02/2014  Does Patient Have a Medical Advance Directive? Yes No No No No No No  Type of Estate agent of [place];Living will - - - - - -  Copy of Healthcare Power of Attorney in Chart? No - copy requested - - - - - -  Would patient like information on creating a medical advance directive? - No - Patient declined No - Patient declined No - Patient declined;Yes (Inpatient - patient requests chaplain consult to create a medical advance directive) - No - patient declined information No - patient declined information    Current Medications (verified) Outpatient Encounter Medications as of 12/06/2019  Medication Sig  . budesonide (ENTOCORT EC) 3 MG 24 hr capsule Take 3 mg by mouth daily.  Marland Kitchen buPROPion (WELLBUTRIN XL)  300 MG 24 hr tablet TAKE 1 TABLET BY MOUTH DAILY  . carvedilol (COREG) 12.5 MG tablet Take 1 tablet (12.5 mg total) by mouth 2 (two) times daily with a meal.  . Cholecalciferol (VITAMIN D-3) 25 MCG (1000 UT) CAPS Take 1,000 Units by mouth daily.   . cyclobenzaprine (FLEXERIL) 5 MG tablet Take 1 tablet by mouth daily.  . diazepam (VALIUM) 5 MG tablet Take 1 tablet (5 mg total) by mouth daily as needed for anxiety.  . furosemide (LASIX) 20 MG tablet Take 1 tablet (20 mg total) by mouth daily.  . Galcanezumab-gnlm (EMGALITY) 120 MG/ML SOAJ Inject 120 mg into the skin every 30 (thirty) days.  . hydrocortisone (ANUSOL-HC) 2.5 % rectal cream Place 1 application rectally 2 (two) times daily.  . hydroxychloroquine (PLAQUENIL) 200 MG tablet Take 2 tablets (400 mg total) by mouth every morning.  . hydrOXYzine (ATARAX/VISTARIL) 25 MG tablet Take 1 tablet (25 mg total) by mouth at bedtime as needed for itching (insomnia).  Marland Kitchen levothyroxine (SYNTHROID) 100 MCG tablet TAKE ONE TABLET EVERY DAY  . losartan (COZAAR) 25 MG tablet Take 1 tablet (25 mg total) by mouth 2 (two) times daily.  . meloxicam (MOBIC) 7.5 MG tablet TAKE ONE TABLET BY MOUTH EVERY DAY  . nystatin-triamcinolone ointment (MYCOLOG) Apply 1 application topically 2 (two) times daily.  Marland Kitchen omeprazole (PRILOSEC) 40 MG capsule Take 1 capsule (40 mg total) by mouth daily.  Marland Kitchen OVER THE COUNTER MEDICATION Take 5 capsules by mouth daily. [employer]ix Optimals  Vitamin & Minerals   . OVER THE COUNTER MEDICATION Take 1 scoop by mouth daily. Ariix Magnecal D   . oxyCODONE-acetaminophen (PERCOCET) 10-325 MG tablet Take 1 tablet by mouth 2 (two) times daily as needed for pain.  Marland Kitchen spironolactone (ALDACTONE) 25 MG tablet TAKE ONE TABLET EVERY DAY  . topiramate (TOPAMAX) 25 MG tablet Take 1 tablet (25 mg total) by mouth 2 (two) times daily.  Marland Kitchen triamcinolone (KENALOG) 0.025 % ointment Apply 1 application topically 2 (two) times daily.  . Wheat Dextrin (BENEFIBER) POWD Take  2 scoop by mouth daily as needed (for constipation).   . [DISCONTINUED] Estradiol-Progesterone 1-100 MG CAPS Take 1 capsule by mouth at bedtime. (Patient not taking: Reported on 12/06/2019)  . [DISCONTINUED] fluticasone (FLONASE) 50 MCG/ACT nasal spray 1 SPRAY IN EACH NOSTRIL EVERY DAY (Patient not taking: Reported on 12/06/2019)   No facility-administered encounter medications on file as of 12/06/2019.    Allergies (verified) Gabapentin (once-daily), Saxenda [liraglutide -weight management], Sulfa antibiotics, Trintellix [vortioxetine], Adhesive [tape], and Codeine   History: Past Medical History:  Diagnosis Date  . Allergic rhinitis due to pollen   . Anxiety   . Asthma   . Carpal tunnel syndrome of right wrist 12/22/2017  . Chronic combined systolic and diastolic CHF (congestive heart failure) (HCC)    06/2016 Echo: EF 40-45%, Gr1 DD  . Chronic fatigue   . Depression   . Family history of polycystic kidney with negative CT 11/16/2012  . Fibromyalgia   . GERD (gastroesophageal reflux disease)   . Hypertension   . Hypothyroidism   . IBS (irritable bowel syndrome)   . Internal hemorrhoids   . Lymphocytic colitis   . Morbid obesity (HCC)   . NICM (nonischemic cardiomyopathy) (HCC)    a. 1999 Cath: nl cors;  b. EF prev as low as 35%;  c. 11/2015 Echo: Ef 40-45%;  d. 06/2016 Echo: EF 40-45%;  e. Lexiscan MV: fixed anterosepta/inferseptal defect w/o ischemia, EF 35%.  . Osteoarthritis   . PONV (postoperative nausea and vomiting)   . PVC (premature ventricular contraction)   . Restless leg syndrome   . Sjogren's syndrome (HCC)   . Vertigo    Past Surgical History:  Procedure Laterality Date  . ABDOMINAL HYSTERECTOMY    . CESAREAN SECTION    . CHOLECYSTECTOMY  10-2008  . COLONOSCOPY  02/14/2015  . ESOPHAGOGASTRODUODENOSCOPY    . EYE SURGERY     2 2013 and 1981/DCR of right eye 05/2014  . LUMBAR DISC SURGERY  2016   L3-4  . RIGHT/LEFT HEART CATH AND CORONARY ANGIOGRAPHY N/A 07/11/2016    Procedure: Right/Left Heart Cath and Coronary Angiography;  Surgeon: Peter M Swaziland, MD;  Location: [facility] INVASIVE CV LAB;  Service: Cardiovascular;  Laterality: N/A;  . SHOULDER ARTHROSCOPY W/ SUBACROMIAL DECOMPRESSION AND DISTAL CLAVICLE EXCISION Right   . SPINE SURGERY     C6-C7, 03/2017  . TUBAL LIGATION  1992   Family History  Problem Relation Age of Onset  . Pancreatic cancer Mother   . Thyroid cancer Mother   . Depression Mother   . Obesity Mother   . Diabetes Father   . Heart disease Father   . Glaucoma Father   . High blood pressure Father   . Thyroid cancer Father   . Alcoholism Father   . Obesity Father   . Kidney failure Brother   . Colon cancer Neg Hx   . Stomach cancer Neg Hx   . Esophageal cancer Neg Hx   .  Colon polyps Neg Hx   . Rectal cancer Neg Hx    Social History   Socioeconomic History  . Marital status: Married    Spouse name: Dorinda Hill  . Number of children: 2  . Years of education: Not on file  . Highest education level: Not on file  Occupational History  . Occupation: Nutritional therapist  . Occupation: disablity  Tobacco Use  . Smoking status: Former Smoker    Packs/day: 2.00    Years: 12.00    Pack years: 24.00    Types: Cigarettes    Quit date: 05/06/1989    Years since quitting: 30.6  . Smokeless tobacco: Never Used  . Tobacco comment: quit smoking 1990  Vaping Use  . Vaping Use: Never used  Substance and Sexual Activity  . Alcohol use: No    Alcohol/week: 0.0 standard drinks  . Drug use: No  . Sexual activity: Yes  Other Topics Concern  . Not on file  Social History Narrative   Former Dr. Andrey Campanile then Dr. 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 Difficulty of Paying Living Expenses: Not hard at all  Food Insecurity: No Food Insecurity  . Worried About Programme researcher, broadcasting/film/video in the Last Year: Never true  . Ran Out of Food in the Last Year: Never true   Transportation Needs: No Transportation Needs  . Lack of Transportation (Medical): No  . Lack of Transportation (Non-Medical): No  Physical Activity: Insufficiently Active  . Days of Exercise per Week: 2 days  . Minutes of Exercise per Session: 20 min  Stress: No Stress Concern Present  . Feeling of Stress : Not at all  Social Connections: Moderately Integrated  . Frequency of Communication with Friends and Family: More than three times a week  . Frequency of Social Gatherings with Friends and Family: Three times a week  . Attends Religious Services: 1 to 4 times per year  . Active Member of Clubs or Organizations: No  . Attends Banker Meetings: Never  . Marital Status: Married    Tobacco Counseling Counseling given: Not Answered Comment: quit smoking 1990   Clinical Intake:  Pre-visit preparation completed: Yes  Pain : No/denies pain     BMI - recorded: 40.75 Nutritional Status: BMI > 30  Obese Diabetes: No     Diabetic?No  Interpreter Needed?: No  Information entered by :: Lanier Ensign, LPN   Activities of Daily Living No flowsheet data found.  Patient Care Team: Willow Ora, MD as PCP - General (Family Medicine) Marcene Corning, MD as Consulting Physician (Orthopedic Surgery) Barnett Abu, MD as Consulting Physician (Neurosurgery) Jethro Bolus, MD (Inactive) as Consulting Physician (Urology) [place], Peter M, MD as Consulting Physician (Cardiology) Wanita Chamberlain, MD (Dermatology) Silverio Lay, MD as Consulting Physician (Obstetrics and Gynecology) Iva Boop, MD as Consulting Physician (Gastroenterology) Marinus Maw, MD as Consulting Physician (Cardiology)  Indicate any recent Medical Services you may have received from other than [facility] providers in the past year (date may be approximate).     Assessment:   This is a routine wellness examination for Shayleigh.  Hearing/Vision screen  Hearing Screening              Right ear:           Left ear:           Comments: Pt denies  any hearing difficulty  Vision Screening Comments: Up with Dr Shawna Orleans for annual exams  Dietary issues and exercise activities discussed:    Goals    . Patient Stated     Lose weight and get on an exercise regimen      Depression Screen PHQ 2/9 Scores 12/06/2019 06/21/2019 02/03/2019 02/10/2018 11/10/2017 08/11/2017 06/09/2017  PHQ - 2 Score 0 4 0 0 0 2 0  PHQ- 9 Score - 11 2 7 6 6 8   Exception Documentation - Medical reason - - - - -    Fall Risk Fall Risk  12/06/2019 02/10/2018 11/10/2017 08/11/2017  Falls in the past year? 0 No No No  Number falls in past yr: 0 - - -  Injury with Fall? 0 - - -  Risk for fall due to : Impaired vision - - -  Follow up Falls prevention discussed - - -    Any stairs in or around the home? Yes  If so, are there any without handrails? No  Home free of loose throw rugs in walkways, pet beds, electrical cords, etc? Yes  Adequate lighting in your home to reduce risk of falls? Yes   ASSISTIVE DEVICES UTILIZED TO PREVENT FALLS:  Life alert? Yes  Use of a cane, walker or w/c? No  Grab bars in the bathroom? No  Shower chair or bench in shower? No  Elevated toilet seat or a handicapped toilet? Yes   TIMED UP AND GO:  Was the test performed? No .   Cognitive Function:     6CIT Screen 12/06/2019  What Year? 0 points  What month? 0 points  What time? 0 points  Count back from 20 0 points  Months in reverse 0 points  Repeat phrase 4 points  Total Score 4    Immunizations Immunization History  Administered Date(s) Administered  . Influenza,inj,Quad PF,6+ Mos 02/08/2015, 02/10/2018, 02/03/2019  . Tdap 02/08/2015    TDAP status: Up to date Flu Vaccine status: Up to date Covid-19 vaccine status: Declined, Education has been provided regarding the importance of this vaccine but patient still declined. Advised may receive this  vaccine at local pharmacy or Health Dept.or vaccine clinic. Aware to provide a copy of the vaccination record if obtained from local pharmacy or Health Dept. Verbalized acceptance and understanding.  Qualifies for Shingles Vaccine? Yes   Zostavax completed No   Shingrix Completed?: No.    Education has been provided regarding the importance of this vaccine. Patient has been advised to call insurance company to determine out of pocket expense if they have not yet received this vaccine. Advised may also receive vaccine at local pharmacy or Health Dept. Verbalized acceptance and understanding.  Screening Tests Health Maintenance  Topic Date Due  . COVID-19 Vaccine (1) Never done  . INFLUENZA VACCINE  12/05/2019  . MAMMOGRAM  05/04/2020  . TETANUS/TDAP  02/07/2025  . COLONOSCOPY  06/28/2029  . Hepatitis C Screening  Completed  . HIV Screening  Completed    Health Maintenance  Health Maintenance Due  Topic Date Due  . COVID-19 Vaccine (1) Never done  . INFLUENZA VACCINE  12/05/2019    Colorectal cancer screening: Completed 06/29/19. Repeat every 10 years Mammogram status: Completed 05/05/19. Repeat every year    Additional Screening:  Hepatitis C Screening:  Completed 06/02/14  Vision Screening: Recommended annual ophthalmology exams for early detection of glaucoma and other disorders of the eye. Is the patient up to date with their annual eye exam?  Yes  Who is the provider or what is the name of the office in which the patient attends annual eye exams? Dr Shawna Orleans   Dental Screening: Recommended annual dental exams for proper oral hygiene  Community Resource Referral / Chronic Care Management: CRR required this visit?  No   CCM required this visit?  No      Plan:     I have personally reviewed and noted the following in the patient's chart:   . Medical and social history . Use of alcohol, tobacco or illicit drugs  . Current medications and  supplements . Functional ability and status . Nutritional status . Physical activity . Advanced directives . List of other physicians . Hospitalizations, surgeries, and ER visits in previous 12 months . Vitals . Screenings to include cognitive, depression, and falls . Referrals and appointments  In addition, I have reviewed and discussed with patient certain preventive protocols, quality metrics, and best practice recommendations. A written personalized care plan for preventive services as well as general preventive health recommendations were provided to patient.     Marzella Schlein, LPN   06/08/4495   Nurse Notes: None

## 2019-12-20 ENCOUNTER — Encounter (INDEPENDENT_AMBULATORY_CARE_PROVIDER_SITE_OTHER): Payer: Self-pay | Admitting: Family Medicine

## 2019-12-20 ENCOUNTER — Other Ambulatory Visit: Payer: Self-pay

## 2019-12-20 ENCOUNTER — Ambulatory Visit (INDEPENDENT_AMBULATORY_CARE_PROVIDER_SITE_OTHER): Payer: Medicare Other | Admitting: Family Medicine

## 2019-12-20 VITALS — BP 107/76 | HR 85 | Temp 98.0°F | Ht 63.0 in | Wt 235.0 lb

## 2019-12-20 DIAGNOSIS — F3289 Other specified depressive episodes: Secondary | ICD-10-CM | POA: Diagnosis not present

## 2019-12-20 DIAGNOSIS — Z6841 Body Mass Index (BMI) 40.0 and over, adult: Secondary | ICD-10-CM | POA: Diagnosis not present

## 2019-12-20 DIAGNOSIS — M797 Fibromyalgia: Secondary | ICD-10-CM

## 2019-12-21 NOTE — Progress Notes (Signed)
Chief Complaint:   OBESITY Kristine Curtis is here to discuss her progress with her obesity treatment plan along with follow-up of her obesity related diagnoses. Kristine Curtis is on keeping a food journal and adhering to recommended goals of 1200-1500 calories and 95+ grams of protein and states she is following her eating plan approximately 0% of the time. Kristine Curtis states she is exercising for 0 minutes 0 times per week.  Today's visit was #: 9 Starting weight: 226 lbs Starting date: 06/21/2019 Today's weight: 235 lbs Today's date: 12/20/2019 Total lbs lost to date: 0 Total lbs lost since last in-office visit: 0  Interim History: Kristine Curtis says she has been very stressed over the past few weeks.  Her daughter moved and is unable to move into her house, so she is with 2 children in an extended stay hotel.  Her father-in-law has dementia and her husband is trying to care for him at home.  She is also having a pain flare.  Her emotional eating has increased and her exercise has decreased.  Assessment/Plan:   1. Fibromyalgia Intensive lifestyle modifications are the first line treatment for this issue. We discussed several lifestyle modifications today and she will continue to work on diet, exercise and weight loss efforts.We will continue to monitor. Orders and follow up as documented in patient record.   Counseling . Try DeathPrevention.it, which is a series of self-care modules designed to teach patients several techniques to manage pain.   2. Other depression, with emotional eating  We reviewed strategies to increase healthy choices and reviewed exercise for stress reduction.  3. Class 3 severe obesity with serious comorbidity and body mass index (BMI) of 40.0 to 44.9 in adult, unspecified obesity type [facility]) Kristine Curtis is currently in the action stage of change. As such, her goal is to continue with weight loss efforts. She has agreed to practice intuitive eating.  Exercise goals: Gentle  exercises.  Behavioral modification strategies: increasing lean protein intake, increasing water intake and increasing high fiber foods.  Kristine Curtis has agreed to follow-up with our clinic in 3 weeks. She was informed of the importance of frequent follow-up visits to maximize her success with intensive lifestyle modifications for her multiple health conditions.   Objective:   Blood pressure 107/76, pulse 85, temperature 98 F (36.7 C), temperature source Oral, height 5\' 3"  (1.6 m), weight 235 lb (106.6 kg), SpO2 99 %. Body mass index is 41.63 kg/m.  General: Cooperative, alert, well developed, in no acute distress. HEENT: Conjunctivae and lids unremarkable. Cardiovascular: Regular rhythm.  Lungs: Normal work of breathing. Neurologic: No focal deficits.   Lab Results  Component Value Date   CREATININE 0.80 06/11/2019   BUN 10 06/11/2019   NA 140 06/11/2019   K 3.9 06/11/2019   CL 108 06/11/2019   CO2 25 06/11/2019   Lab Results  Component Value Date   ALT 24 06/11/2019   AST 22 06/11/2019   ALKPHOS 90 06/11/2019   BILITOT 0.4 06/11/2019   Lab Results  Component Value Date   HGBA1C 4.8 06/21/2019   HGBA1C 4.8 02/03/2019   HGBA1C 4.9 03/20/2016   HGBA1C 5.2 11/21/2014   Lab Results  Component Value Date   INSULIN 31.4 (H) 06/21/2019   Lab Results  Component Value Date   TSH 3.86 06/01/2019   Lab Results  Component Value Date   CHOL 170 02/03/2019   HDL 50.20 02/03/2019   LDLCALC 93 02/03/2019   LDLDIRECT 75.0 03/20/2016   TRIG 132.0 02/03/2019  CHOLHDL 3 02/03/2019   Lab Results  Component Value Date   WBC 5.5 06/11/2019   HGB 12.8 06/11/2019   HCT 38.1 06/11/2019   MCV 95.8 06/11/2019   PLT 229.0 06/11/2019   Lab Results  Component Value Date   IRON 41 (L) 11/17/2008   FERRITIN 25.1 11/17/2008   Attestation Statements:   Reviewed by clinician on day of visit: allergies, medications, problem list, medical history, surgical history, family history,  social history, and previous encounter notes.  Time spent on visit including pre-visit chart review and post-visit care and charting was 25 minutes.   I, Insurance claims handler, CMA, am acting as transcriptionist for Helane Rima, DO  I have reviewed the above documentation for accuracy and completeness, and I agree with the above. Helane Rima, DO

## 2019-12-23 ENCOUNTER — Other Ambulatory Visit: Payer: Self-pay | Admitting: Family Medicine

## 2019-12-23 DIAGNOSIS — G8929 Other chronic pain: Secondary | ICD-10-CM

## 2020-01-17 ENCOUNTER — Other Ambulatory Visit: Payer: Self-pay

## 2020-01-17 ENCOUNTER — Encounter (INDEPENDENT_AMBULATORY_CARE_PROVIDER_SITE_OTHER): Payer: Self-pay | Admitting: Family Medicine

## 2020-01-17 ENCOUNTER — Ambulatory Visit (INDEPENDENT_AMBULATORY_CARE_PROVIDER_SITE_OTHER): Payer: Medicare Other | Admitting: Family Medicine

## 2020-01-17 VITALS — BP 97/70 | HR 52 | Temp 98.9°F | Ht 63.0 in | Wt 234.0 lb

## 2020-01-17 DIAGNOSIS — G8929 Other chronic pain: Secondary | ICD-10-CM | POA: Diagnosis not present

## 2020-01-17 DIAGNOSIS — F3289 Other specified depressive episodes: Secondary | ICD-10-CM | POA: Diagnosis not present

## 2020-01-17 DIAGNOSIS — Z6841 Body Mass Index (BMI) 40.0 and over, adult: Secondary | ICD-10-CM | POA: Diagnosis not present

## 2020-01-19 NOTE — Progress Notes (Signed)
Chief Complaint:   OBESITY Kristine Curtis is here to discuss her progress with her obesity treatment plan along with follow-up of her obesity related diagnoses. Kristine Curtis is on practicing portion control and making smarter food choices, such as increasing vegetables and decreasing simple carbohydrates and states she is following her eating plan approximately 5% of the time. Kristine Curtis states she is exercising for 0 minutes 0 times per week.  Today's visit was #: 10 Starting weight: 226 lbs Starting date: 06/21/2019 Today's weight: 234 lbs Today's date: 01/17/2020 Total lbs lost to date: 0 Total lbs lost since last in-office visit: 1 lb  Interim History: Kristine Curtis is not drinking any sodas and says that she is drinking water.  Her daughter is now safe in her new home.  Her husband is still the caregiver for dad.  They are working to spend more time together, though.  She spent 3 days at the beach recently with her sister, and enjoyed it.  Assessment/Plan:   1. Other chronic pain Not optimized. Kristine Curtis has a great understanding of exercise, stress reduction, and whole foods-based diet to augment treatment for chronic pain. She is starting to get back to her regimen. Will continue to monitor symptoms as they relate to her weight loss journey.  2. Other depression, with emotional eating  Improving. Behavior modification techniques were discussed today to help Kristine Curtis deal with her emotional/non-hunger eating behaviors.    3. Class 3 severe obesity with serious comorbidity and body mass index (BMI) of 40.0 to 44.9 in adult, unspecified obesity type Kristine Curtis) Kristine Curtis is currently in the action stage of change. As such, her goal is to continue with weight loss efforts. She has agreed to practicing portion control and making smarter food choices, such as increasing vegetables and decreasing simple carbohydrates.   Exercise goals: Stretches from PT.  Behavioral modification strategies: keeping healthy foods in the home  and emotional eating strategies.  Kristine Curtis has agreed to follow-up with our clinic in 3 weeks. She was informed of the importance of frequent follow-up visits to maximize her success with intensive lifestyle modifications for her multiple health conditions.   Objective:   Blood pressure 97/70, pulse (!) 52, temperature 98.9 F (37.2 C), temperature source Oral, height 5\' 3"  (1.6 m), weight 234 lb (106.1 kg), SpO2 95 %. Body mass index is 41.45 kg/m.  General: Cooperative, alert, well developed, in no acute distress. HEENT: Conjunctivae and lids unremarkable. Cardiovascular: Regular rhythm.  Lungs: Normal work of breathing. Neurologic: No focal deficits.   Lab Results  Component Value Date   CREATININE 0.80 06/11/2019   BUN 10 06/11/2019   NA 140 06/11/2019   K 3.9 06/11/2019   CL 108 06/11/2019   CO2 25 06/11/2019   Lab Results  Component Value Date   ALT 24 06/11/2019   AST 22 06/11/2019   ALKPHOS 90 06/11/2019   BILITOT 0.4 06/11/2019   Lab Results  Component Value Date   HGBA1C 4.8 06/21/2019   HGBA1C 4.8 02/03/2019   HGBA1C 4.9 03/20/2016   HGBA1C 5.2 11/21/2014   Lab Results  Component Value Date   INSULIN 31.4 (H) 06/21/2019   Lab Results  Component Value Date   TSH 3.86 06/01/2019   Lab Results  Component Value Date   CHOL 170 02/03/2019   HDL 50.20 02/03/2019   LDLCALC 93 02/03/2019   LDLDIRECT 75.0 03/20/2016   TRIG 132.0 02/03/2019   CHOLHDL 3 02/03/2019   Lab Results  Component Value Date  WBC 5.5 06/11/2019   HGB 12.8 06/11/2019   HCT 38.1 06/11/2019   MCV 95.8 06/11/2019   PLT 229.0 06/11/2019   Lab Results  Component Value Date   IRON 41 (L) 11/17/2008   FERRITIN 25.1 11/17/2008   Attestation Statements:   Reviewed by clinician on day of visit: allergies, medications, problem list, medical history, surgical history, family history, social history, and previous encounter notes.  Time spent on visit including pre-visit chart review  and post-visit care and charting was 30 minutes.   I, Insurance claims handler, CMA, am acting as transcriptionist for Helane Rima, DO  I have reviewed the above documentation for accuracy and completeness, and I agree with the above. Helane Rima, DO

## 2020-01-31 ENCOUNTER — Other Ambulatory Visit: Payer: Self-pay

## 2020-01-31 ENCOUNTER — Encounter: Payer: Self-pay | Admitting: Family Medicine

## 2020-01-31 ENCOUNTER — Ambulatory Visit (INDEPENDENT_AMBULATORY_CARE_PROVIDER_SITE_OTHER): Payer: Medicare Other | Admitting: Family Medicine

## 2020-01-31 VITALS — BP 120/72 | HR 83 | Temp 97.3°F | Resp 16 | Ht 63.0 in | Wt 240.6 lb

## 2020-01-31 DIAGNOSIS — I5022 Chronic systolic (congestive) heart failure: Secondary | ICD-10-CM | POA: Diagnosis not present

## 2020-01-31 DIAGNOSIS — E039 Hypothyroidism, unspecified: Secondary | ICD-10-CM

## 2020-01-31 DIAGNOSIS — G8929 Other chronic pain: Secondary | ICD-10-CM

## 2020-01-31 DIAGNOSIS — F418 Other specified anxiety disorders: Secondary | ICD-10-CM

## 2020-01-31 DIAGNOSIS — Z23 Encounter for immunization: Secondary | ICD-10-CM | POA: Diagnosis not present

## 2020-01-31 DIAGNOSIS — M5441 Lumbago with sciatica, right side: Secondary | ICD-10-CM

## 2020-01-31 DIAGNOSIS — M3500 Sicca syndrome, unspecified: Secondary | ICD-10-CM | POA: Diagnosis not present

## 2020-01-31 DIAGNOSIS — I10 Essential (primary) hypertension: Secondary | ICD-10-CM | POA: Diagnosis not present

## 2020-01-31 DIAGNOSIS — M797 Fibromyalgia: Secondary | ICD-10-CM

## 2020-01-31 DIAGNOSIS — M791 Myalgia, unspecified site: Secondary | ICD-10-CM

## 2020-01-31 DIAGNOSIS — E538 Deficiency of other specified B group vitamins: Secondary | ICD-10-CM

## 2020-01-31 DIAGNOSIS — T466X5A Adverse effect of antihyperlipidemic and antiarteriosclerotic drugs, initial encounter: Secondary | ICD-10-CM

## 2020-01-31 DIAGNOSIS — K52838 Other microscopic colitis: Secondary | ICD-10-CM

## 2020-01-31 MED ORDER — OXYCODONE-ACETAMINOPHEN 10-325 MG PO TABS: 1.0000 | ORAL_TABLET | Freq: Two times a day (BID) | ORAL | 0 refills | Status: DC | PRN

## 2020-01-31 MED ORDER — DIAZEPAM 5 MG PO TABS: 5.0000 mg | ORAL_TABLET | Freq: Every day | ORAL | 2 refills | Status: DC | PRN

## 2020-01-31 NOTE — Progress Notes (Signed)
Subjective  Chief Complaint  Patient presents with  . Annual Exam    fasting  . Hypertension  . Hyperlipidemia  . Hypothyroidism  . Diarrhea    HPI: Kristine Curtis is a 59 y.o. female who presents to [facility] [place] today for a Female Wellness Visit. She also has the concerns and/or needs as listed above in the chief complaint. These will be addressed in addition to the Health Maintenance Visit.   Wellness Visit: annual visit with health maintenance review and exam without Pap   HM: mammo due in December.  Overall she is doing well.  Working with healthy weight and wellness to work on her food addiction and emotional eating disorder.  Stressful life but she is coping with it well.  Due for flu shot today Chronic disease f/u and/or acute problem visit: (deemed necessary to be done in addition to the wellness visit):  Hypertension: Remains well controlled without symptoms of low blood pressure or ischemia  Well compensated chronic systolic heart failure.  Reviewed cardiology notes.  Sjogren's, fibromyalgia, chronic pain: Currently stabilizing.  Has several flares.  Needs refill of pain medicine that she uses only on occasion.  Continues to stretch regularly.  Depression: Stable on Wellbutrin, occasionally uses Valium to manage stressors.I reviewed patient's records from the PMP aware controlled substance registry today.   She is statin intolerant  Hypothyroidism: Energy level is good.  Compliant with medications.  No symptoms of high or low levels.  Assessment  1. Essential hypertension   2. Chronic systolic congestive heart failure (HCC), EF 35-40%   3. Acquired hypothyroidism   4. Sjogren's syndrome, with unspecified organ involvement (HCC)   5. Fibromyalgia   6. B12 deficiency   7. Myalgia due to statin   8. Other microscopic colitis   9. Chronic right-sided low back pain with right-sided sciatica   10. Depression with anxiety      Plan    Female Wellness Visit:  Age appropriate Health Maintenance and Prevention measures were discussed with patient. Included topics are cancer screening recommendations, ways to keep healthy (see AVS) including dietary and exercise recommendations, regular eye and dental care, use of seat belts, and avoidance of moderate alcohol use and tobacco use.   BMI: discussed patient's BMI and encouraged positive lifestyle modifications to help get to or maintain a target BMI.  HM needs and immunizations were addressed and ordered. See below for orders. See HM and immunization section for updates.  Flu shot today  Routine labs and screening tests ordered including cmp, cbc and lipids where appropriate.  Discussed recommendations regarding Vit D and calcium supplementation (see AVS)  Chronic disease management visit and/or acute problem visit:  Hypertension and chronic heart failure are well controlled.  Continue current medications.  Continue follow-up with cardiology  Chronic pain, fibromyalgia, intractable migraines: Continues to see specialist.  Refill pain medication.  Rare use.  Drug database reviewed and appropriate  Depression and anxiety: Continue Wellbutrin daily.  Refill Valium as needed.  Check vitamin B12 levels in addition to other lab work including renal function and liver function.  Check thyroid.  Adjust medications as needed   Follow up: 6 months for blood pressure follow-up Orders Placed This Encounter  Procedures  . CBC with Differential/Platelet  . COMPLETE METABOLIC PANEL WITH GFR  . Lipid panel  . TSH  . Vitamin B12   Meds ordered this encounter  Medications  . oxyCODONE-acetaminophen (PERCOCET) 10-325 MG tablet  Sig: Take 1 tablet by mouth 2 (two) times daily as needed for pain.    Dispense:  30 tablet    Refill:  0  . diazepam (VALIUM) 5 MG tablet    Sig: Take 1 tablet (5 mg total) by mouth daily as needed for anxiety.    Dispense:  30 tablet    Refill:  2       Lifestyle: Body mass index is 42.62 kg/m. Wt Readings from Last 3 Encounters:  01/31/20 240 lb 9.6 oz (109.1 kg)  01/17/20 234 lb (106.1 kg)  12/20/19 235 lb (106.6 kg)     Patient Active Problem List   Diagnosis Date Noted  . OSA (obstructive sleep apnea) 11/10/2017    Priority: High  . Myofascial pain dysfunction syndrome 07/14/2017    Priority: High  . Morbid obesity (HCC) 02/24/2017    Priority: High  . Sjogren's disease (HCC) 02/24/2017    Priority: High  . Fibromyalgia 01/21/2017    Priority: High  . Chronic low back pain with sciatica 05/02/2015    Priority: High    MRI lumbar 2016 [facility], NS and uses meloxicam with prn oxycodone and valium for mm spasms   . Chronic systolic congestive heart failure (HCC), EF 35-40% 03/22/2015    Priority: High    06/28/16 ECHO  - Limited study to assess LV function; full doppler study not   performed; mild to moderate global reduction in LV systolic   function; grade 1 diastolic dysfunction; mild LVH; mild LVE; mild   LAE.  07/11/16 Right/Left Heart Cath and Coronary Angiography     There is moderate left ventricular systolic dysfunction.  The left ventricular ejection fraction is 35-45% by visual estimate.  LV end diastolic pressure is mildly elevated.   1. Normal coronary anatomy 2. Moderate LV dysfunction- global. EF 35-40%.  3. Mildly elevated LVEDP 4. Normal pulmonary pressures. RA pressure is elevated. 5. Normal cardiac output.  Plan: continue medical therapy. Consider addition of aldactone to current therapy      . Depression with anxiety 08/31/2008    Priority: High    Failed cymbalta, prozac and trintellix Tolerates wellbutrin    . Essential hypertension 08/31/2008    Priority: High  . Acquired hypothyroidism 08/30/2008    Priority: High  . Hormone replacement therapy (HRT) 07/29/2019    Priority: Medium  . IBS (irritable bowel syndrome) 11/10/2017    Priority: Medium  . RLS (restless  legs syndrome) 10/01/2017    Priority: Medium  . Moderate persistent asthma 08/31/2008    Priority: Medium  . Esophageal reflux 08/31/2008    Priority: Medium  . Classical migraine without intractable migraine 08/30/2008    Priority: Medium  . B12 deficiency 11/10/2017    Priority: Low  . Myalgia due to statin 11/10/2017    Priority: Low  . Degenerative arthritis of left knee, XRAY12/2018 07/13/2017    Priority: Low  . Vitamin D deficiency 10/09/2009    Priority: Low  . Allergic rhinitis 08/31/2008    Priority: Low  . Lumbar disc disease 10/01/2017  . Cervical radiculopathy 07/14/2017   Health Maintenance  Topic Date Due  . COVID-19 Vaccine (1) Never done  . INFLUENZA VACCINE  12/05/2019  . MAMMOGRAM  05/04/2020  . TETANUS/TDAP  02/07/2025  . COLONOSCOPY  06/28/2029  . Hepatitis C Screening  Completed  . HIV Screening  Completed   Immunization History  Administered Date(s) Administered  . Influenza,inj,Quad PF,6+ Mos 02/08/2015, 02/10/2018, 02/03/2019  . Tdap 02/08/2015  We updated and reviewed the patient's past history in detail and it is documented below. Allergies: Patient is allergic to gabapentin (once-daily), saxenda [liraglutide -weight management], sulfa antibiotics, trintellix [vortioxetine], adhesive [tape], and codeine. Past Medical History Patient  has a past medical history of Allergic rhinitis due to pollen, Anxiety, Asthma, Carpal tunnel syndrome of right wrist (12/22/2017), Chronic combined systolic and diastolic CHF (congestive heart failure) (HCC), Chronic fatigue, Depression, Family history of polycystic kidney with negative CT (11/16/2012), Fibromyalgia, GERD (gastroesophageal reflux disease), Hypertension, Hypothyroidism, IBS (irritable bowel syndrome), Internal hemorrhoids, Lymphocytic colitis, Morbid obesity (HCC), NICM (nonischemic cardiomyopathy) (HCC), Osteoarthritis, PONV (postoperative nausea and vomiting), PVC (premature ventricular contraction),  Restless leg syndrome, Sjogren's syndrome (HCC), and Vertigo. Past Surgical History Patient  has a past surgical history that includes Abdominal hysterectomy; Cesarean section; Tubal ligation (1992); Cholecystectomy (10-2008); Shoulder arthroscopy w/ subacromial decompression and distal clavicle excision (Right); Eye surgery; Lumbar disc surgery (2016); Esophagogastroduodenoscopy; RIGHT/LEFT HEART CATH AND CORONARY ANGIOGRAPHY (N/A, 07/11/2016); Spine surgery; and Colonoscopy (02/14/2015). Family History: Patient family history includes Alcoholism in her father; Depression in her mother; Diabetes in her father; Glaucoma in her father; Heart disease in her father; High blood pressure in her father; Kidney failure in her brother; Obesity in her father and mother; Pancreatic cancer in her mother; Thyroid cancer in her father and mother. Social History:  Patient  reports that she quit smoking about 30 years ago. Her smoking use included cigarettes. She has a 24.00 pack-year smoking history. She has never used smokeless tobacco. She reports that she does not drink alcohol and does not use drugs.  Review of Systems: Constitutional: negative for fever or malaise Ophthalmic: negative for photophobia, double vision or loss of vision Cardiovascular: negative for chest pain, dyspnea on exertion, or new LE swelling Respiratory: negative for SOB or persistent cough Gastrointestinal: negative for abdominal pain, change in bowel habits or melena Genitourinary: negative for dysuria or gross hematuria, no abnormal uterine bleeding or disharge Musculoskeletal: negative for new gait disturbance or muscular weakness Integumentary: negative for new or persistent rashes, no breast lumps Neurological: negative for TIA or stroke symptoms Psychiatric: negative for SI or delusions Allergic/Immunologic: negative for hives  Patient Care Team    Relationship Specialty Notifications Start End  Willow Ora, MD PCP -  General Family Medicine  04/20/19   Marcene Corning, MD Consulting Physician Orthopedic Surgery  02/09/15   Barnett Abu, MD Consulting Physician Neurosurgery  02/09/15   Jethro Bolus, MD (Inactive) Consulting Physician Urology  02/09/15   [place], Peter M, MD Consulting Physician Cardiology  02/09/15   Wanita Chamberlain, MD  Dermatology  11/28/17   Silverio Lay, MD Consulting Physician Obstetrics and Gynecology  04/20/19   Iva Boop, MD Consulting Physician Gastroenterology  06/01/19   Marinus Maw, MD Consulting Physician Cardiology  07/29/19     Objective  Vitals: BP 120/72   Pulse 83   Temp (!) 97.3 F (36.3 C) (Temporal)   Resp 16   Ht 5\' 3"  (1.6 m)   Wt 240 lb 9.6 oz (109.1 kg)   SpO2 96%   BMI 42.62 kg/m  General:  Well developed, well nourished, no acute distress  Psych:  Alert and orientedx3,normal mood and affect HEENT:  Normocephalic, atraumatic, non-icteric sclera,  supple neck without adenopathy, mass or thyromegaly Cardiovascular:  Normal S1, S2, RRR without gallop, rub or murmur Respiratory:  Good breath sounds bilaterally, CTAB with normal respiratory effort Gastrointestinal: normal bowel sounds, soft, non-tender, no noted masses.  No HSM MSK: no deformities, contusions. Joints are without erythema or swelling.  Skin:  Warm, no rashes or suspicious lesions noted Neurologic:    Mental status is normal. CN 2-11 are normal. Gross motor and sensory exams are normal. Normal gait. No tremor Breast Exam: No mass, skin retraction or nipple discharge is appreciated in either breast. No axillary adenopathy. Fibrocystic changes are noted    Commons side effects, risks, benefits, and alternatives for medications and treatment plan prescribed today were discussed, and the patient expressed understanding of the given instructions. Patient is instructed to call or message via MyChart if he/she has any questions or concerns regarding our treatment plan. No barriers to  understanding were identified. We discussed Red Flag symptoms and signs in detail. Patient expressed understanding regarding what to do in case of urgent or emergency type symptoms.   Medication list was reconciled, printed and provided to the patient in AVS. Patient instructions and summary information was reviewed with the patient as documented in the AVS. This note was prepared with assistance of Dragon voice recognition software. Occasional wrong-word or sound-a-like substitutions may have occurred due to the inherent limitations of voice recognition software  This visit occurred during the SARS-CoV-2 public health emergency.  Safety protocols were in place, including screening questions prior to the visit, additional usage of staff PPE, and extensive cleaning of exam room while observing appropriate contact time as indicated for disinfecting solutions.

## 2020-01-31 NOTE — Patient Instructions (Signed)
Please return in 6 months for hypertension follow up and medical problem recheck.   I will release your lab results to you on your MyChart account with further instructions. Please reply with any questions.  Today you were given your flu vaccination.   If you have any questions or concerns, please don't hesitate to send me a message via MyChart or call the office at [phone]. Thank you for visiting with [place] today! It's our pleasure caring for you.

## 2020-02-01 LAB — COMPLETE METABOLIC PANEL WITH GFR
AG Ratio: 1.9 (calc) (ref 1.0–2.5)
ALT: 21 U/L (ref 6–29)
AST: 20 U/L (ref 10–35)
Albumin: 3.7 g/dL (ref 3.6–5.1)
Alkaline phosphatase (APISO): 76 U/L (ref 37–153)
BUN: 9 mg/dL (ref 7–25)
CO2: 24 mmol/L (ref 20–32)
Calcium: 8.7 mg/dL (ref 8.6–10.4)
Chloride: 110 mmol/L (ref 98–110)
Creat: 0.9 mg/dL (ref 0.50–1.05)
GFR, Est African American: 81 mL/min/{1.73_m2} (ref >=60)
GFR, Est Non African American: 70 mL/min/{1.73_m2} (ref >=60)
Globulin: 1.9 g/dL (calc) (ref 1.9–3.7)
Glucose, Bld: 99 mg/dL (ref 65–99)
Potassium: 3.8 mmol/L (ref 3.5–5.3)
Sodium: 142 mmol/L (ref 135–146)
Total Bilirubin: 0.4 mg/dL (ref 0.2–1.2)
Total Protein: 5.6 g/dL — ABNORMAL LOW (ref 6.1–8.1)

## 2020-02-01 LAB — CBC WITH DIFFERENTIAL/PLATELET
Absolute Monocytes: 367 cells/uL (ref 200–950)
Basophils Absolute: 32 cells/uL (ref 0–200)
Basophils Relative: 0.6 %
Eosinophils Absolute: 200 cells/uL (ref 15–500)
Eosinophils Relative: 3.7 %
HCT: 40 % (ref 35.0–45.0)
Hemoglobin: 13.1 g/dL (ref 11.7–15.5)
Lymphs Abs: 1955 cells/uL (ref 850–3900)
MCH: 31.8 pg (ref 27.0–33.0)
MCHC: 32.8 g/dL (ref 32.0–36.0)
MCV: 97.1 fL (ref 80.0–100.0)
MPV: 10.8 fL (ref 7.5–12.5)
Monocytes Relative: 6.8 %
Neutro Abs: 2846 cells/uL (ref 1500–7800)
Neutrophils Relative %: 52.7 %
Platelets: 240 10*3/uL (ref 140–400)
RBC: 4.12 10*6/uL (ref 3.80–5.10)
RDW: 11.4 % (ref 11.0–15.0)
Total Lymphocyte: 36.2 %
WBC: 5.4 10*3/uL (ref 3.8–10.8)

## 2020-02-01 LAB — VITAMIN B12: Vitamin B-12: 1414 pg/mL — ABNORMAL HIGH (ref 200–1100)

## 2020-02-01 LAB — TSH: TSH: 3.83 mIU/L (ref 0.40–4.50)

## 2020-02-01 LAB — LIPID PANEL
Cholesterol: 155 mg/dL (ref <200)
HDL: 46 mg/dL — ABNORMAL LOW (ref >=50)
LDL Cholesterol (Calc): 81 mg/dL (calc)
Non-HDL Cholesterol (Calc): 109 mg/dL (calc) (ref <130)
Total CHOL/HDL Ratio: 3.4 (calc) (ref <5.0)
Triglycerides: 181 mg/dL — ABNORMAL HIGH (ref <150)

## 2020-02-14 ENCOUNTER — Ambulatory Visit (INDEPENDENT_AMBULATORY_CARE_PROVIDER_SITE_OTHER): Payer: Medicare Other | Admitting: Family Medicine

## 2020-02-14 ENCOUNTER — Encounter (INDEPENDENT_AMBULATORY_CARE_PROVIDER_SITE_OTHER): Payer: Self-pay | Admitting: Family Medicine

## 2020-02-14 ENCOUNTER — Other Ambulatory Visit: Payer: Self-pay

## 2020-02-14 VITALS — BP 110/75 | HR 77 | Temp 98.0°F | Ht 63.0 in | Wt 238.0 lb

## 2020-02-14 DIAGNOSIS — E039 Hypothyroidism, unspecified: Secondary | ICD-10-CM

## 2020-02-14 DIAGNOSIS — Z6841 Body Mass Index (BMI) 40.0 and over, adult: Secondary | ICD-10-CM | POA: Diagnosis not present

## 2020-02-14 DIAGNOSIS — M797 Fibromyalgia: Secondary | ICD-10-CM

## 2020-02-14 DIAGNOSIS — F3289 Other specified depressive episodes: Secondary | ICD-10-CM | POA: Diagnosis not present

## 2020-02-16 NOTE — Progress Notes (Signed)
Chief Complaint:   OBESITY Kristine Curtis is here to discuss her progress with her obesity treatment plan along with follow-up of her obesity related diagnoses. Mayzie is on practicing portion control and making smarter food choices, such as increasing vegetables and decreasing simple carbohydrates and states she is following her eating plan approximately 100% of the time. Bryttney states she has increased her activity level.  Today's visit was #: 11 Starting weight: 226 lbs Starting date: 06/21/2019 Today's weight: 238 lbs Today's date: 02/14/2020 Total lbs lost to date: 0 Total lbs lost since last in-office visit: +4 lbs  Interim History: [name] is ready to be more structured with her meal plan. We discussed health guidelines.  Plan: Daily goal: 1500 calories/100 grams protein/> 25 g fiber/> 80 ounces of water. Lunch and dinner - 4 ounces of lean protein, 1-2 cups of vegetables, okay 1/3-1/2 cup "starch", okay 1 tablespoon healthy fat. Breakfast:  She loves oatmeal, so will have that.  Assessment/Plan:   1. Fibromyalgia Stable, but not optimized. [name] always improves with regular exercise. We discussed possibly restarting PT. She will focus on protein-rich, low simple carbohydrate foods. We reviewed the importance of hydration, regular exercise for stress reduction, and restorative sleep.   2. Acquired hypothyroidism She is taking levothyroxine 100 mcg daily. She appears euthyroid. We will continue to monitor symptoms as they relate to her weight loss journey.  Lab Results  Component Value Date   TSH 3.83 01/31/2020   3. Other depression, with emotional eating  Behavior modification techniques were discussed today to help Ambry deal with her emotional/non-hunger eating behaviors.    4. Class 3 severe obesity with serious comorbidity and body mass index (BMI) of 40.0 to 44.9 in adult, unspecified obesity type [facility])  Ryelynn is currently in the action stage of change. As such, her goal  is to continue with weight loss efforts. She has agreed to practicing portion control and making smarter food choices, such as increasing vegetables and decreasing simple carbohydrates.   Exercise goals: PT exercises for fibro.  Work on mindfulness, non-food related activities, journal.  Research scientist (physical sciences) modification strategies: increasing lean protein intake and increasing water intake.  Shamiya has agreed to follow-up with our clinic in 2-3 weeks. She was informed of the importance of frequent follow-up visits to maximize her success with intensive lifestyle modifications for her multiple health conditions.   Objective:   Blood pressure 110/75, pulse 77, temperature 98 F (36.7 C), temperature source Oral, height 5\' 3"  (1.6 m), weight 238 lb (108 kg), SpO2 100 %. Body mass index is 42.16 kg/m.  General: Cooperative, alert, well developed, in no acute distress. HEENT: Conjunctivae and lids unremarkable. Cardiovascular: Regular rhythm.  Lungs: Normal work of breathing. Neurologic: No focal deficits.   Lab Results  Component Value Date   CREATININE 0.90 01/31/2020   BUN 9 01/31/2020   NA 142 01/31/2020   K 3.8 01/31/2020   CL 110 01/31/2020   CO2 24 01/31/2020   Lab Results  Component Value Date   ALT 21 01/31/2020   AST 20 01/31/2020   ALKPHOS 90 06/11/2019   BILITOT 0.4 01/31/2020   Lab Results  Component Value Date   HGBA1C 4.8 06/21/2019   HGBA1C 4.8 02/03/2019   HGBA1C 4.9 03/20/2016   HGBA1C 5.2 11/21/2014   Lab Results  Component Value Date   INSULIN 31.4 (H) 06/21/2019   Lab Results  Component Value Date   TSH 3.83 01/31/2020   Lab Results  Component Value  Date   CHOL 155 01/31/2020   HDL 46 (L) 01/31/2020   LDLCALC 81 01/31/2020   LDLDIRECT 75.0 03/20/2016   TRIG 181 (H) 01/31/2020   CHOLHDL 3.4 01/31/2020   Lab Results  Component Value Date   WBC 5.4 01/31/2020   HGB 13.1 01/31/2020   HCT 40.0 01/31/2020   MCV 97.1 01/31/2020   PLT 240 01/31/2020     Lab Results  Component Value Date   IRON 41 (L) 11/17/2008   FERRITIN 25.1 11/17/2008   Obesity Behavioral Intervention:   Approximately 15 minutes were spent on the discussion below.  ASK: We discussed the diagnosis of obesity with Rinaldo Cloud today and Aysa agreed to give [place] permission to discuss obesity behavioral modification therapy today.  ASSESS: Medina has the diagnosis of obesity and her BMI today is 42.3. Toiya is in the action stage of change.   ADVISE: Ladavia was educated on the multiple health risks of obesity as well as the benefit of weight loss to improve her health. She was advised of the need for long term treatment and the importance of lifestyle modifications to improve her current health and to decrease her risk of future health problems.  AGREE: Multiple dietary modification options and treatment options were discussed and Finola agreed to follow the recommendations documented in the above note.  ARRANGE: Tishara was educated on the importance of frequent visits to treat obesity as outlined per CMS and USPSTF guidelines and agreed to schedule her next follow up appointment today.  Attestation Statements:   Reviewed by clinician on day of visit: allergies, medications, problem list, medical history, surgical history, family history, social history, and previous encounter notes.  I, Insurance claims handler, CMA, am acting as transcriptionist for Helane Rima, DO  I have reviewed the above documentation for accuracy and completeness, and I agree with the above. Helane Rima, DO

## 2020-03-06 ENCOUNTER — Encounter (INDEPENDENT_AMBULATORY_CARE_PROVIDER_SITE_OTHER): Payer: Self-pay | Admitting: Family Medicine

## 2020-03-06 ENCOUNTER — Ambulatory Visit (INDEPENDENT_AMBULATORY_CARE_PROVIDER_SITE_OTHER): Payer: Medicare Other | Admitting: Family Medicine

## 2020-03-06 ENCOUNTER — Other Ambulatory Visit: Payer: Self-pay

## 2020-03-06 VITALS — BP 130/82 | HR 88 | Temp 97.7°F | Ht 63.0 in | Wt 237.0 lb

## 2020-03-06 DIAGNOSIS — F3289 Other specified depressive episodes: Secondary | ICD-10-CM

## 2020-03-06 DIAGNOSIS — E785 Hyperlipidemia, unspecified: Secondary | ICD-10-CM

## 2020-03-06 DIAGNOSIS — G43809 Other migraine, not intractable, without status migrainosus: Secondary | ICD-10-CM

## 2020-03-06 DIAGNOSIS — Z6841 Body Mass Index (BMI) 40.0 and over, adult: Secondary | ICD-10-CM

## 2020-03-06 DIAGNOSIS — M797 Fibromyalgia: Secondary | ICD-10-CM

## 2020-03-06 NOTE — Progress Notes (Signed)
Chief Complaint:   OBESITY Elainah is here to discuss her progress with her obesity treatment plan along with follow-up of her obesity related diagnoses.   Today's visit was #: 12 Starting weight: 226 lbs Starting date: 06/21/2019 Today's weight: 237 lbs Today's date: 03/06/2020 Total lbs lost to date: +11 lbs Body mass index is 41.98 kg/m.   Interim History: [name] says she is working hard to get her water intake to 60-80 ounces per day.  She has decreased her soda intake. She is food journaling and having healthy meal alternatives.    Nutrition Plan: practicing portion control and making smarter food choices, such as increasing vegetables and decreasing simple carbohydrates for 50% of the time.  Hunger is moderately controlled controlled. Cravings are moderately controlled controlled.  Activity:  She has increased the amount of walking she is doing.  Assessment/Plan:   1. Fibromyalgia Stable, but not optimized. [name] always improves with regular exercise. We reviewed the importance of hydration, regular exercise, and restorative sleep.   2. Dyslipidemia Plan: Dietary changes: Increase soluble fiber. Decrease simple carbohydrates.   Lab Results  Component Value Date   CHOL 155 01/31/2020   HDL 46 (L) 01/31/2020   LDLCALC 81 01/31/2020   LDLDIRECT 75.0 03/20/2016   TRIG 181 (H) 01/31/2020   CHOLHDL 3.4 01/31/2020   Lab Results  Component Value Date   ALT 21 01/31/2020   AST 20 01/31/2020   ALKPHOS 90 06/11/2019   BILITOT 0.4 01/31/2020   The 10-year ASCVD risk score [name] DC Jr., et al., 2013) is: 7%   Values used to calculate the score:     Age: 70 years     Sex: Female     Is Non-Hispanic African American: No     Diabetic: Yes     Tobacco smoker: No     Systolic Blood Pressure: 121 mmHg     Is BP treated: Yes     HDL Cholesterol: 46 mg/dL     Total Cholesterol: 155 mg/dL  3. Other migraine without status migrainosus, not intractable Srah is taking  Emgality, Maxalt, and Topamax for migraines. We will continue to monitor symptoms as they relate to her weight loss journey.  4. Other depression, with emotional eating  Lashana is taking Wellbutrin 300 mg daily.  Behavior modification techniques were discussed today to help Anasophia deal with her emotional/non-hunger eating behaviors.  She will reach out to her previous therapist, Aurther Loft.  5. Class 3 severe obesity with serious comorbidity and body mass index (BMI) of 40.0 to 44.9 in adult, unspecified obesity type [facility])  Missi is currently in the action stage of change. As such, her goal is to continue with weight loss efforts.   Nutrition goals: She has agreed to keeping a food journal and adhering to recommended goals of 1500 calories and 100 grams of protein with >25 fiber and >80 ounces of water.   Exercise goals: For substantial health benefits, adults should do at least 150 minutes (2 hours and 30 minutes) a week of moderate-intensity, or 75 minutes (1 hour and 15 minutes) a week of vigorous-intensity aerobic physical activity, or an equivalent combination of moderate- and vigorous-intensity aerobic activity. Aerobic activity should be performed in episodes of at least 10 minutes, and preferably, it should be spread throughout the week.  Behavioral modification strategies: emotional eating strategies.  Jacoria has agreed to follow-up with our clinic in 3 weeks. She was informed of the importance of frequent follow-up visits to maximize  her success with intensive lifestyle modifications for her multiple health conditions.   Objective:   Blood pressure 130/82, pulse 88, temperature 97.7 F (36.5 C), temperature source Oral, height 5\' 3"  (1.6 m), weight 237 lb (107.5 kg), SpO2 98 %. Body mass index is 41.98 kg/m.  General: Cooperative, alert, well developed, in no acute distress. HEENT: Conjunctivae and lids unremarkable. Cardiovascular: Regular rhythm.  Lungs: Normal work of  breathing. Neurologic: No focal deficits.   Lab Results  Component Value Date   CREATININE 0.90 01/31/2020   BUN 9 01/31/2020   NA 142 01/31/2020   K 3.8 01/31/2020   CL 110 01/31/2020   CO2 24 01/31/2020   Lab Results  Component Value Date   ALT 21 01/31/2020   AST 20 01/31/2020   ALKPHOS 90 06/11/2019   BILITOT 0.4 01/31/2020   Lab Results  Component Value Date   HGBA1C 4.8 06/21/2019   HGBA1C 4.8 02/03/2019   HGBA1C 4.9 03/20/2016   HGBA1C 5.2 11/21/2014   Lab Results  Component Value Date   INSULIN 31.4 (H) 06/21/2019   Lab Results  Component Value Date   TSH 3.83 01/31/2020   Lab Results  Component Value Date   CHOL 155 01/31/2020   HDL 46 (L) 01/31/2020   LDLCALC 81 01/31/2020   LDLDIRECT 75.0 03/20/2016   TRIG 181 (H) 01/31/2020   CHOLHDL 3.4 01/31/2020   Lab Results  Component Value Date   WBC 5.4 01/31/2020   HGB 13.1 01/31/2020   HCT 40.0 01/31/2020   MCV 97.1 01/31/2020   PLT 240 01/31/2020   Lab Results  Component Value Date   IRON 41 (L) 11/17/2008   FERRITIN 25.1 11/17/2008   Obesity Behavioral Intervention:   Approximately 15 minutes were spent on the discussion below.  ASK: We discussed the diagnosis of obesity with 11/19/2008 today and Joann agreed to give Rinaldo Cloud permission to discuss obesity behavioral modification therapy today.  ASSESS: Antonisha has the diagnosis of obesity and her BMI today is 42.1. Laurann is in the action stage of change.   ADVISE: Yuri was educated on the multiple health risks of obesity as well as the benefit of weight loss to improve her health. She was advised of the need for long term treatment and the importance of lifestyle modifications to improve her current health and to decrease her risk of future health problems.  AGREE: Multiple dietary modification options and treatment options were discussed and Naiomy agreed to follow the recommendations documented in the above note.  ARRANGE: Zeynab was educated  on the importance of frequent visits to treat obesity as outlined per CMS and USPSTF guidelines and agreed to schedule her next follow up appointment today.  Attestation Statements:   Reviewed by clinician on day of visit: allergies, medications, problem list, medical history, surgical history, family history, social history, and previous encounter notes.  I, Rinaldo Cloud, CMA, am acting as transcriptionist for Insurance claims handler, DO  I have reviewed the above documentation for accuracy and completeness, and I agree with the above. Helane Rima, DO

## 2020-03-18 NOTE — Progress Notes (Signed)
Cardiology Office Note    Date:  03/20/2020   ID:  Kristine Curtis, DOB Sep 18, 1960, MRN [id number]  PCP:  Willow Ora, MD  Cardiologist:  Dr. Swaziland   Chief Complaint  Patient presents with  . Congestive Heart Failure    History of Present Illness:  Kristine Curtis is a 59 y.o. female with PMH of NICM, PVCs, HTN, fibromyalgia, morbid obesity, hypothyroidism, RLS and vertigo.She had a cardiac catheterization in 1999 that showed normal coronaries. EF previously as low as 35%,EFimproved to 40-45% by2017. Cardiac MRI in 2017 showed EF 44% with global hypokinesis, no scar or infiltration. She was admitted in February 2018 with 24 hour history of chest pain, dyspnea and headache. Cardiac enzyme were negative. Limited echocardiogram showed EF 40-45% without pericardial effusion. Stress test showed fixed anteroseptal and inferoseptal defect without ischemia. EF was measured 35%. Despite Myoview showing no ischemia, she continued to have exertional chest discomfort and subsequently underwent a left and right heart cath 07/11/2016 which showed normal coronaries. She also had normal cardiac output and EF 35-40%. She had a sleep study that showed in the past no obstructive sleep apnea.She does have restless leg syndrome  Patient was admitted in September 2018 with a episode of syncope.  Outpatient 30-day event monitor was negative for significant arrhythmia.  On follow up today she is doing quite well. Her weight is still fluctuating without any increased dyspnea or edema. Takes lasix 20 mg daily and has not required any extra doses. She is tolerating her medication well.  Denies any dyspnea, palpitations, dizziness, or chest pain. Feels like she is in a good place from a cardiac standpoint.     Past Medical History:  Diagnosis Date  . Allergic rhinitis due to pollen   . Anxiety   . Asthma   . Carpal tunnel syndrome of right wrist 12/22/2017  . Chronic combined systolic and diastolic  CHF (congestive heart failure) (HCC)    06/2016 Echo: EF 40-45%, Gr1 DD  . Chronic fatigue   . Depression   . Family history of polycystic kidney with negative CT 11/16/2012  . Fibromyalgia   . GERD (gastroesophageal reflux disease)   . Hypertension   . Hypothyroidism   . IBS (irritable bowel syndrome)   . Internal hemorrhoids   . Lymphocytic colitis   . Morbid obesity (HCC)   . NICM (nonischemic cardiomyopathy) (HCC)    a. 1999 Cath: nl cors;  b. EF prev as low as 35%;  c. 11/2015 Echo: Ef 40-45%;  d. 06/2016 Echo: EF 40-45%;  e. Lexiscan MV: fixed anterosepta/inferseptal defect w/o ischemia, EF 35%.  . Osteoarthritis   . PONV (postoperative nausea and vomiting)   . PVC (premature ventricular contraction)   . Restless leg syndrome   . Sjogren's syndrome (HCC)   . Vertigo     Past Surgical History:  Procedure Laterality Date  . ABDOMINAL HYSTERECTOMY    . CESAREAN SECTION    . CHOLECYSTECTOMY  10-2008  . COLONOSCOPY  02/14/2015  . ESOPHAGOGASTRODUODENOSCOPY    . EYE SURGERY     2 2013 and 1981/DCR of right eye 05/2014  . LUMBAR DISC SURGERY  2016   L3-4  . RIGHT/LEFT HEART CATH AND CORONARY ANGIOGRAPHY N/A 07/11/2016   Procedure: Right/Left Heart Cath and Coronary Angiography;  Surgeon: Everhett Bozard M Swaziland, MD;  Location: [facility] INVASIVE CV LAB;  Service: Cardiovascular;  Laterality: N/A;  . SHOULDER ARTHROSCOPY W/ SUBACROMIAL DECOMPRESSION AND DISTAL CLAVICLE EXCISION Right   .  SPINE SURGERY     C6-C7, 03/2017  . TUBAL LIGATION  1992    Current Medications: Outpatient Medications Prior to Visit  Medication Sig Dispense Refill  . buPROPion (WELLBUTRIN XL) 300 MG 24 hr tablet TAKE 1 TABLET BY MOUTH DAILY 30 tablet 11  . carvedilol (COREG) 12.5 MG tablet Take 1 tablet (12.5 mg total) by mouth 2 (two) times daily with a meal. 180 tablet 3  . Cholecalciferol (VITAMIN D-3) 25 MCG (1000 UT) CAPS Take 1,000 Units by mouth daily.     . cyclobenzaprine (FLEXERIL) 5 MG tablet Take 1 tablet by  mouth daily.    . diazepam (VALIUM) 5 MG tablet Take 1 tablet (5 mg total) by mouth daily as needed for anxiety. 30 tablet 2  . furosemide (LASIX) 20 MG tablet Take 1 tablet (20 mg total) by mouth daily. 90 tablet 3  . Galcanezumab-gnlm (EMGALITY) 120 MG/ML SOAJ Inject 120 mg into the skin every 30 (thirty) days.    . hydrocortisone (ANUSOL-HC) 2.5 % rectal cream Place 1 application rectally 2 (two) times daily. 30 g 1  . hydroxychloroquine (PLAQUENIL) 200 MG tablet Take 2 tablets (400 mg total) by mouth every morning. 90 tablet 2  . hydrOXYzine (ATARAX/VISTARIL) 25 MG tablet Take 1 tablet (25 mg total) by mouth at bedtime as needed for itching (insomnia). 180 tablet 1  . levothyroxine (SYNTHROID) 100 MCG tablet TAKE ONE TABLET EVERY DAY 90 tablet 3  . losartan (COZAAR) 25 MG tablet Take 1 tablet (25 mg total) by mouth 2 (two) times daily. 180 tablet 3  . meloxicam (MOBIC) 7.5 MG tablet TAKE ONE TABLET BY MOUTH EVERY DAY 90 tablet 0  . nystatin-triamcinolone ointment (MYCOLOG) Apply 1 application topically 2 (two) times daily. 30 g 5  . omeprazole (PRILOSEC) 40 MG capsule Take 1 capsule (40 mg total) by mouth daily. 90 capsule 3  . OVER THE COUNTER MEDICATION Take 5 capsules by mouth daily. Ariix Optimals Vitamin & Minerals     . OVER THE COUNTER MEDICATION Take 1 scoop by mouth daily. Ariix Magnecal D     . oxyCODONE-acetaminophen (PERCOCET) 10-325 MG tablet Take 1 tablet by mouth 2 (two) times daily as needed for pain. 30 tablet 0  . rizatriptan (MAXALT) 10 MG tablet Take 10 mg by mouth as needed for migraine. May repeat in 2 hours if needed    . spironolactone (ALDACTONE) 25 MG tablet TAKE ONE TABLET EVERY DAY 90 tablet 3  . topiramate (TOPAMAX) 25 MG tablet Take 1 tablet (25 mg total) by mouth 2 (two) times daily. 60 tablet 3  . triamcinolone (KENALOG) 0.025 % ointment Apply 1 application topically 2 (two) times daily. 454 g 0  . Wheat Dextrin (BENEFIBER) POWD Take 2 scoop by mouth daily as  needed (for constipation).      No facility-administered medications prior to visit.     Allergies:   Gabapentin (once-daily), Saxenda [liraglutide -weight management], Sulfa antibiotics, Trintellix [vortioxetine], Adhesive [tape], and Codeine   Social History   Socioeconomic History  . Marital status: Married    Spouse name: Dorinda Hill  . Number of children: 2  . Years of education: Not on file  . Highest education level: Not on file  Occupational History  . Occupation: Nutritional therapist  . Occupation: disablity  Tobacco Use  . Smoking status: Former Smoker    Packs/day: 2.00    Years: 12.00    Pack years: 24.00    Types: Cigarettes    Quit  date: 05/06/1989    Years since quitting: 30.8  . Smokeless tobacco: Never Used  . Tobacco comment: quit smoking 1990  Vaping Use  . Vaping Use: Never used  Substance and Sexual Activity  . Alcohol use: No    Alcohol/week: 0.0 standard drinks  . Drug use: No  . Sexual activity: Yes  Other Topics Concern  . Not on file  Social History Narrative   Former Dr. Andrey Campanile then Dr. 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 Difficulty of Paying Living Expenses: Not hard at all  Food Insecurity: No Food Insecurity  . Worried About Programme researcher, broadcasting/film/video in the Last Year: Never true  . Ran Out of Food in the Last Year: Never true  Transportation Needs: No Transportation Needs  . Lack of Transportation (Medical): No  . Lack of Transportation (Non-Medical): No  Physical Activity: Insufficiently Active  . Days of Exercise per Week: 2 days  . Minutes of Exercise per Session: 20 min  Stress: No Stress Concern Present  . Feeling of Stress : Not at all  Social Connections: Moderately Integrated  . Frequency of Communication with Friends and Family: More than three times a week  . Frequency of Social Gatherings with Friends and Family: Three times a week  . Attends  Religious Services: 1 to 4 times per year  . Active Member of Clubs or Organizations: No  . Attends Banker Meetings: Never  . Marital Status: Married     Family History:  The patient's family history includes Alcoholism in her father; Depression in her mother; Diabetes in her father; Glaucoma in her father; Heart disease in her father; High blood pressure in her father; Kidney failure in her brother; Obesity in her father and mother; Pancreatic cancer in her mother; Thyroid cancer in her father and mother.   ROS:   Please see the history of present illness.    ROS All other systems reviewed and are negative.   PHYSICAL EXAM:   VS:  BP 121/76   Pulse 83   Temp 97.7 F (36.5 C)   Ht 5\' 3"  (1.6 m)   Wt 246 lb 12.8 oz (111.9 kg)   SpO2 99%   BMI 43.72 kg/m    GEN: Well nourished, well developed, in no acute distress  HEENT: normal  Neck: no JVD, carotid bruits, or masses Cardiac: RRR; no murmurs, rubs, or gallops,no edema  Respiratory:  clear to auscultation bilaterally, normal work of breathing GI: soft, nontender, nondistended, + BS MS: no deformity or atrophy  Skin: warm and dry, no rash Neuro:  Alert and Oriented x 3, Strength and sensation are intact Psych: euthymic mood, full affect  Wt Readings from Last 3 Encounters:  03/20/20 246 lb 12.8 oz (111.9 kg)  03/06/20 237 lb (107.5 kg)  02/14/20 238 lb (108 kg)      Studies/Labs Reviewed:   EKG:  EKG is not ordered today.    Recent Labs: 01/31/2020: ALT 21; BUN 9; Creat 0.90; Hemoglobin 13.1; Platelets 240; Potassium 3.8; Sodium 142; TSH 3.83   Lipid Panel    Component Value Date/Time   CHOL 155 01/31/2020 0906   TRIG 181 (H) 01/31/2020 0906   HDL 46 (L) 01/31/2020 0906   CHOLHDL 3.4 01/31/2020 0906   VLDL 26.4 02/03/2019 1429   LDLCALC 81 01/31/2020 0906   LDLDIRECT 75.0 03/20/2016 0836  Additional studies/ records that were reviewed today include:   Echo 06/28/2016 LV EF: 40% -   45% Study  Conclusions  - Left ventricle: The cavity size was mildly dilated. Wall   thickness was increased in a pattern of mild LVH. Systolic   function was mildly to moderately reduced. The estimated ejection   fraction was in the range of 40% to 45%. Diffuse hypokinesis.   Doppler parameters are consistent with abnormal left ventricular   relaxation (grade 1 diastolic dysfunction). - Left atrium: The atrium was mildly dilated.  Impressions:  - Limited study to assess LV function; full doppler study not   performed; mild to moderate global reduction in LV systolic   function; grade 1 diastolic dysfunction; mild LVH; mild LVE; mild   LAE.   Cath 07/11/2016 Conclusion     There is moderate left ventricular systolic dysfunction.  The left ventricular ejection fraction is 35-45% by visual estimate.  LV end diastolic pressure is mildly elevated.  1. Normal coronary anatomy 2. Moderate LV dysfunction- global. EF 35-40%.  3. Mildly elevated LVEDP 4. Normal pulmonary pressures. RA pressure is elevated. 5. Normal cardiac output.  Plan: continue medical therapy. Consider addition of aldactone to current therapy      ASSESSMENT:    1. Chronic systolic heart failure (HCC)   2. NICM (nonischemic cardiomyopathy) (HCC)   3. PVC's (premature ventricular contractions)      PLAN:  In order of problems listed above:  1. Chronic systolic CHF with Nonischemic cardiomyopathy: EF 35 to 40% on previous cardiac catheterization.  She has been stable on therapy with Coreg, aldactone, and losartan. I would like to see if we can switch from losartan to [facility] given better outcome data. Will switch to 24/26 mg bid. Will need to watch for hypotension. If she does develop may need to reduce aldactone or Coreg. She may also be a candidate for an SGLT 2 inhibitor. Will need to evaluate cost. Will have her follow up with Pharm D in a couple of weeks to assess response and titrate therapy. I will  follow up in 3-4 months and once on optimal therapy we will plan to repeat Echo.   2. Hypertension: Blood pressure is controlled.   3. Hypothyroidism: Managed by primary care provider.  4. PVCs: this is well controlled on carvedilol.  5.   Obesity. Encourage weight loss.     Medication Adjustments/Labs and Tests Ordered: Current medicines are reviewed at length with the patient today.  Concerns regarding medicines are outlined above.  Medication changes, Labs and Tests ordered today are listed in the Patient Instructions below. There are no Patient Instructions on file for this visit.   Signed, Raylea Adcox Swaz[place], MD  03/20/2020 4:24 PM    [facility] [address] [place], [place], [place]  [address] Phone: [phone]; Fax: [phone]

## 2020-03-20 ENCOUNTER — Ambulatory Visit (INDEPENDENT_AMBULATORY_CARE_PROVIDER_SITE_OTHER): Payer: Medicare Other | Admitting: Cardiology

## 2020-03-20 ENCOUNTER — Other Ambulatory Visit: Payer: Self-pay

## 2020-03-20 ENCOUNTER — Encounter: Payer: Self-pay | Admitting: Cardiology

## 2020-03-20 VITALS — BP 121/76 | HR 83 | Temp 97.7°F | Ht 63.0 in | Wt 246.8 lb

## 2020-03-20 DIAGNOSIS — I428 Other cardiomyopathies: Secondary | ICD-10-CM | POA: Diagnosis not present

## 2020-03-20 DIAGNOSIS — I5022 Chronic systolic (congestive) heart failure: Secondary | ICD-10-CM

## 2020-03-20 DIAGNOSIS — I493 Ventricular premature depolarization: Secondary | ICD-10-CM | POA: Diagnosis not present

## 2020-03-20 MED ORDER — ENTRESTO 24-26 MG PO TABS: 1.0000 | ORAL_TABLET | Freq: Two times a day (BID) | ORAL | 6 refills | Status: DC

## 2020-03-20 NOTE — Patient Instructions (Signed)
Medication Instructions:  Stop Losartan Start Entresto 24/26 mg twice a day  Continue all other medications *If you need a refill on your cardiac medications before your next appointment, please call your pharmacy*   Lab Work: None ordered   Testing/Procedures: None ordered   Follow-Up: At [facility], you and your health needs are our priority.  As part of our continuing mission to provide you with exceptional heart care, we have created designated Provider Care Teams.  These Care Teams include your primary Cardiologist (physician) and Advanced Practice Providers (APPs -  Physician Assistants and Nurse Practitioners) who all work together to provide you with the care you need, when you need it.  We recommend signing up for the patient portal called "MyChart".  Sign up information is provided on this After Visit Summary.  MyChart is used to connect with patients for Virtual Visits (Telemedicine).  Patients are able to view lab/test results, encounter notes, upcoming appointments, etc.  Non-urgent messages can be sent to your provider as well.   To learn more about what you can do with MyChart, go to [email or web address].    Your next appointment:  3 to 4 months   The format for your next appointment:  Office   Provider:  Dr.Jordan   Schedule appointment with Pharmacist in 2 weeks to follow up [facility]

## 2020-03-27 ENCOUNTER — Other Ambulatory Visit: Payer: Self-pay | Admitting: Family Medicine

## 2020-03-27 DIAGNOSIS — M5441 Lumbago with sciatica, right side: Secondary | ICD-10-CM

## 2020-03-27 DIAGNOSIS — G8929 Other chronic pain: Secondary | ICD-10-CM

## 2020-03-27 DIAGNOSIS — K219 Gastro-esophageal reflux disease without esophagitis: Secondary | ICD-10-CM

## 2020-04-03 ENCOUNTER — Ambulatory Visit (INDEPENDENT_AMBULATORY_CARE_PROVIDER_SITE_OTHER): Payer: Medicare Other | Admitting: Family Medicine

## 2020-04-05 ENCOUNTER — Ambulatory Visit (INDEPENDENT_AMBULATORY_CARE_PROVIDER_SITE_OTHER): Payer: Medicare Other | Admitting: Family Medicine

## 2020-04-13 ENCOUNTER — Other Ambulatory Visit: Payer: Self-pay

## 2020-04-13 ENCOUNTER — Ambulatory Visit (INDEPENDENT_AMBULATORY_CARE_PROVIDER_SITE_OTHER): Payer: Medicare Other | Admitting: Pharmacist Clinician (PhC)/ Clinical Pharmacy Specialist

## 2020-04-13 VITALS — BP 116/80 | HR 92 | Resp 16 | Ht 63.0 in | Wt 242.0 lb

## 2020-04-13 DIAGNOSIS — I5022 Chronic systolic (congestive) heart failure: Secondary | ICD-10-CM | POA: Diagnosis not present

## 2020-04-13 NOTE — Progress Notes (Signed)
04/14/2020 Kristine Curtis [birth year] [id number]   HPI:  Kristine Curtis is a 59 y.o. female patient of Dr Swaziland, with a PMH below who presents today for heart failure medication titration.  She was seen by Dr. Swaziland on November 15 and started on Entresto 24/26 mg twice daily.  EF was at 35-40% on previous cardiac cath, but most recent limited echo showed an increase to 40-45%.  At her last visit her BP was stable at 121/76 and he switched her losartan to [facility].  She is also on carvedilol 12.5 mg bid and sprionolactone 25 mg qd, furosemide 20 mg qd.  She returns to the office today for potential medication titration.  States that since starting [place] she feels as though she is sleeping better at night.  Previously she would sleep deeply for about 2-3 hours, then spend the rest of the night in and out of a light sleep.  Now she is sleeping soundly for about 5-6 hours almost all nights.  She also notes that her heart rate has dropped into the mid/upper 80's, about a 10 point drop.    Past Medical History: hypertension Controlled on entresto and carvedilol  hypothyroidism levothyroixine 100 mcg  Chronic back pain On long term percocet/cyclobenzaprine prn     Blood Pressure Goal:  130/80  Current Medications: Entresto 24/26 mg bid, carvedilol 12.5 mg bid, spironolactone 25 mg qd  Family Hx: father has AF, hypertension (started in late Sep 11, 2022); now 22; mother died at 51 from pancreatic cancer; one brother died last month at 77 - kidney failure/hypertension; another sister, brother, no specific issues; 2 children, daughter with gestational hypertension/preeclampsia; son with hypertension, recovering alcoholic, (his BP better now since he is 6 years sober)  Social Hx: no, no, is addicted to coke/pepsi - can drind as much as 24 cans over 3-4 days, but states may then go several days without  Diet: tries to eat more at home, but does eat out some, no added salt; tries to eat more  grilled than fried (uses coconut oil spray).  Next 56 days eating plan; working with [facility] as well (has gained 9 pounds since starting in February).   Exercise: yoga 3 days per week - started recently  Home BP readings: none with her today.  States mostly in the 110-120 systolic range, did have one down to 97.    Intolerances: liraglutide caused depression  Labs: 9/21:  Na 142, K 3.8, Glu 99, BUN 9, SCr 0.9, GFR 70  Wt Readings from Last 3 Encounters:  04/13/20 242 lb (109.8 kg)  03/20/20 246 lb 12.8 oz (111.9 kg)  03/06/20 237 lb (107.5 kg)   BP Readings from Last 3 Encounters:  04/13/20 116/80  03/20/20 121/76  03/06/20 130/82   Pulse Readings from Last 3 Encounters:  04/13/20 92  03/20/20 83  03/06/20 88    Current Outpatient Medications  Medication Sig Dispense Refill  . buPROPion (WELLBUTRIN XL) 300 MG 24 hr tablet TAKE 1 TABLET BY MOUTH DAILY 30 tablet 11  . carvedilol (COREG) 12.5 MG tablet Take 1 tablet (12.5 mg total) by mouth 2 (two) times daily with a meal. 180 tablet 3  . Cholecalciferol (VITAMIN D-3) 25 MCG (1000 UT) CAPS Take 1,000 Units by mouth daily.     . cyclobenzaprine (FLEXERIL) 5 MG tablet Take 1 tablet by mouth daily.    . diazepam (VALIUM) 5 MG tablet Take 1 tablet (5 mg total) by mouth daily  as needed for anxiety. 30 tablet 2  . furosemide (LASIX) 20 MG tablet Take 1 tablet (20 mg total) by mouth daily. 90 tablet 3  . Galcanezumab-gnlm 120 MG/ML SOAJ Inject 120 mg into the skin every 30 (thirty) days.    . hydrocortisone (ANUSOL-HC) 2.5 % rectal cream Place 1 application rectally 2 (two) times daily. 30 g 1  . hydroxychloroquine (PLAQUENIL) 200 MG tablet Take 2 tablets (400 mg total) by mouth every morning. 90 tablet 2  . hydrOXYzine (ATARAX/VISTARIL) 25 MG tablet Take 1 tablet (25 mg total) by mouth at bedtime as needed for itching (insomnia). 180 tablet 1  . levothyroxine (SYNTHROID) 100 MCG tablet TAKE ONE TABLET EVERY DAY 90  tablet 3  . meloxicam (MOBIC) 7.5 MG tablet TAKE ONE TABLET BY MOUTH EVERY DAY 90 tablet 0  . nystatin-triamcinolone ointment (MYCOLOG) Apply 1 application topically 2 (two) times daily. 30 g 5  . omeprazole (PRILOSEC) 40 MG capsule TAKE 1 CAPSULE EVERY DAY 90 capsule 3  . OVER THE COUNTER MEDICATION Take 5 capsules by mouth daily. Ariix Optimals Vitamin & Minerals    . OVER THE COUNTER MEDICATION Take 1 scoop by mouth daily. Ariix Magnecal D    . oxyCODONE-acetaminophen (PERCOCET) 10-325 MG tablet Take 1 tablet by mouth 2 (two) times daily as needed for pain. 30 tablet 0  . rizatriptan (MAXALT) 10 MG tablet Take 10 mg by mouth as needed for migraine. May repeat in 2 hours if needed    . spironolactone (ALDACTONE) 25 MG tablet TAKE ONE TABLET EVERY DAY 90 tablet 3  . topiramate (TOPAMAX) 25 MG tablet Take 1 tablet (25 mg total) by mouth 2 (two) times daily. 60 tablet 3  . Wheat Dextrin (BENEFIBER) POWD Take 2 scoop by mouth daily as needed (for constipation).     Marland Kitchen empagliflozin (JARDIANCE) 10 MG TABS tablet Take 1 tablet (10 mg total) by mouth daily before breakfast. 28 tablet 0  . sacubitril-valsartan (ENTRESTO) 49-51 MG Take 1 tablet by mouth 2 (two) times daily. 28 tablet 0   No current facility-administered medications for this visit.    Allergies  Allergen Reactions  . Gabapentin (Once-Daily)     Depression   . Saxenda [Liraglutide -Weight Management] Other (See Comments)    Depression   . Sulfa Antibiotics     itching  . Trintellix [Vortioxetine] Other (See Comments)    GI issues   . Adhesive [Tape] Rash  . Codeine Itching    Past Medical History:  Diagnosis Date  . Allergic rhinitis due to pollen   . Anxiety   . Asthma   . Carpal tunnel syndrome of right wrist 12/22/2017  . Chronic combined systolic and diastolic CHF (congestive heart failure) (HCC)    06/2016 Echo: EF 40-45%, Gr1 DD  . Chronic fatigue   . Depression   . Family history of polycystic kidney with negative  CT 11/16/2012  . Fibromyalgia   . GERD (gastroesophageal reflux disease)   . Hypertension   . Hypothyroidism   . IBS (irritable bowel syndrome)   . Internal hemorrhoids   . Lymphocytic colitis   . Morbid obesity (HCC)   . NICM (nonischemic cardiomyopathy) (HCC)    a. 1999 Cath: nl cors;  b. EF prev as low as 35%;  c. 11/2015 Echo: Ef 40-45%;  d. 06/2016 Echo: EF 40-45%;  e. Lexiscan MV: fixed anterosepta/inferseptal defect w/o ischemia, EF 35%.  . Osteoarthritis   . PONV (postoperative nausea and vomiting)   .  PVC (premature ventricular contraction)   . Restless leg syndrome   . Sjogren's syndrome (HCC)   . Vertigo     Blood pressure 116/80, pulse 92, resp. rate 16, height 5\' 3"  (1.6 m), weight 242 lb (109.8 kg), SpO2 99 %.  Chronic systolic congestive heart failure (HCC), EF 35-40% Patient currently on Entresto 24/26 and tolerating well.  Will have her increase dose to 49/51 mg.  She is to continue monitoring home blood pressure readings and knows to call if develops symptoms of hypotension before her follow up.  Will also start her on Jardiance 10 mg daly for GDMT.  She was given samples of this in the office today and should she tolerate it, we will write prescription at next visit.  Follow up in 3 weeks, at which time she was advised to bring her home BP cuff as well as list of home BP readings.     07-21-1984 PharmD CPP [facility] [address] [place], [place] [phone]

## 2020-04-13 NOTE — Patient Instructions (Addendum)
Return for a a follow up appointment December 29 at 11:30 am  Go to the lab in 2 weeks to check kidney function  Check your blood pressure at home daily and keep record of the readings.  If your blood pressure drops too low or you become symptomatic, please call us at [phone] (Laia Wiley/Raquel)  Take your meds as follows:  Increase Entresto to 49/51 mg twice daily.  Take 2 of the 24/26 mg tablets twice daily until they are gone, then start on the new tablets.    Start Jardiance 10 mg once daily.  Continue with all other medications  Bring all of your meds, your BP cuff and your record of home blood pressures to your next appointment.  Exercise as you're able, try to walk approximately 30 minutes per day.  Keep salt intake to a minimum, especially watch canned and prepared boxed foods.  Eat more fresh fruits and vegetables and fewer canned items.  Avoid eating in fast food restaurants.    HOW TO TAKE YOUR BLOOD PRESSURE: . Rest 5 minutes before taking your blood pressure. .  Don't smoke or drink caffeinated beverages for at least 30 minutes before. . Take your blood pressure before (not after) you eat. . Sit comfortably with your back supported and both feet on the floor (don't cross your legs). . Elevate your arm to heart level on a table or a desk. . Use the proper sized cuff. It should fit smoothly and snugly around your bare upper arm. There should be enough room to slip a fingertip under the cuff. The bottom edge of the cuff should be 1 inch above the crease of the elbow. . Ideally, take 3 measurements at one sitting and record the average.

## 2020-04-14 ENCOUNTER — Encounter: Payer: Self-pay | Admitting: Pharmacist Clinician (PhC)/ Clinical Pharmacy Specialist

## 2020-04-14 MED ORDER — EMPAGLIFLOZIN 10 MG PO TABS: 10.0000 mg | ORAL_TABLET | Freq: Every day | ORAL | 0 refills | Status: DC

## 2020-04-14 MED ORDER — ENTRESTO 49-51 MG PO TABS: 1.0000 | ORAL_TABLET | Freq: Two times a day (BID) | ORAL | 0 refills | Status: DC

## 2020-04-14 NOTE — Assessment & Plan Note (Signed)
Patient currently on Entresto 24/26 and tolerating well.  Will have her increase dose to 49/51 mg.  She is to continue monitoring home blood pressure readings and knows to call if develops symptoms of hypotension before her follow up.  Will also start her on Jardiance 10 mg daly for GDMT.  She was given samples of this in the office today and should she tolerate it, we will write prescription at next visit.  Follow up in 3 weeks, at which time she was advised to bring her home BP cuff as well as list of home BP readings.

## 2020-04-20 ENCOUNTER — Other Ambulatory Visit: Payer: Self-pay | Admitting: Family Medicine

## 2020-04-20 ENCOUNTER — Other Ambulatory Visit: Payer: Self-pay | Admitting: Cardiology

## 2020-04-25 ENCOUNTER — Telehealth: Payer: Self-pay

## 2020-04-25 NOTE — Telephone Encounter (Signed)
[name] wanting to speak with Dr.Andy, did tell patient she was out of the office until next week. Kristine Curtis states she took an at home covid test last night and this morning and has tested positive. Kristine Curtis has asked what she should do, did advise to go ahead and schedule virtual visit tomorrow, to which she did.

## 2020-04-25 NOTE — Telephone Encounter (Signed)
FYI

## 2020-04-25 NOTE — Telephone Encounter (Signed)
  Nurse Assessment Nurse: Charna Elizabeth, RN, Cathy Date/Time (Eastern Time): 04/24/2020 10:14:20 AM Confirm and document reason for call. If symptomatic, describe symptoms. ---Rinaldo Cloud states that she developed cold, cough symptoms about 3-4 weeks ago that is worse today and fever (100.0 today) with headache, muscle ache yesterday She developed shortness of breath yesterday. No severe breathing difficulty or blueness around her lips. No chest pain. Alert and responsive. (possible COVID concerns due to symptoms and being in hospital/public places) Does the patient have any new or worsening symptoms? ---Yes Will a triage be completed? ---Yes Related visit to physician within the last 2 weeks? ---No Does the PT have any chronic conditions? (i.e. diabetes, asthma, this includes High risk factors for pregnancy, etc.) ---Yes List chronic conditions. ---CHF, High Blood Pressure, Asthma (no medications) Is this a behavioral health or substance abuse call? ---No Guidelines Guideline Title Affirmed Question Affirmed Notes Nurse Date/Time (Eastern Time) COVID-19 - Diagnosed or Suspected MODERATE difficulty breathing (e.g., speaks in phrases, SOB even at rest, pulse 100-120) [place], RN, [facility] 04/24/2020 10:18:56 AM Disp. Time Lamount Cohen Time) Disposition Final User 04/24/2020 10:12:09 AM Send to Urgent Queue Gokounous, Erin PLEASE NOTE: All timestamps contained within this report are represented as [place] Standard Time. CONFIDENTIALTY NOTICE: This fax transmission is intended only for the addressee. It contains information that is legally privileged, confidential or otherwise protected from use or disclosure. If you are not the intended recipient, you are strictly prohibited from reviewing, disclosing, copying using or disseminating any of this information or taking any action in reliance on or regarding this information. If you have received this fax in error, please notify us immediately by  telephone so that we can arrange for its return to [place]. Phone: [phone], Toll-Free: [phone], Fax: [phone] Page: 2 of 2 Call Id: [id number] 04/24/2020 10:22:47 AM Go to ED Now Yes Charna Elizabeth, RN, Frann Rider Disagree/Comply Comply Caller Understands Yes PreDisposition Call Doctor Care Advice Given Per Guideline GO TO ED NOW: * You need to be seen in the Emergency Department. * Go to the ED at ___________ Hospital. * Leave now. Drive carefully. YOU SHOULD TELL HEALTHCARE PERSONNEL THAT YOU MIGHT HAVE COVID-19: * Tell the first healthcare worker you meet that you may have COVID-19. * Tell them you have symptoms and have been sent for COVID-19 testing. WEAR A MASK - COVER YOUR MOUTH AND NOSE: * Wear a mask. * If you do not have a mask, then cover your mouth and nose with a disposable tissue (e.g., Kleenex, toilet paper, paper towel) or wash cloth. ANOTHER ADULT SHOULD DRIVE: * It is better and safer if another adult drives instead of you. CALL EMS 911 IF: * Severe difficulty breathing occurs * Lips or face turns blue * Confusion occurs. CARE ADVICE given per COVID-19 - DIAGNOSED OR SUSPECTED (Adult) guideline. Referrals GO TO FACILITY OTHER - SPECIFY

## 2020-04-26 ENCOUNTER — Encounter: Payer: Self-pay | Admitting: Physician Assistant

## 2020-04-26 ENCOUNTER — Telehealth (INDEPENDENT_AMBULATORY_CARE_PROVIDER_SITE_OTHER): Payer: Medicare Other | Admitting: Physician Assistant

## 2020-04-26 VITALS — Temp 97.6°F | Ht 63.0 in | Wt 235.0 lb

## 2020-04-26 DIAGNOSIS — U071 COVID-19: Secondary | ICD-10-CM

## 2020-04-26 MED ORDER — FLUTICASONE-SALMETEROL 100-50 MCG/DOSE IN AEPB: 1.0000 | INHALATION_SPRAY | Freq: Two times a day (BID) | RESPIRATORY_TRACT | 3 refills | Status: DC

## 2020-04-26 MED ORDER — ALBUTEROL SULFATE HFA 108 (90 BASE) MCG/ACT IN AERS: 2.0000 | INHALATION_SPRAY | Freq: Four times a day (QID) | RESPIRATORY_TRACT | 3 refills | Status: DC | PRN

## 2020-04-26 MED ORDER — PREDNISONE 20 MG PO TABS: 40.0000 mg | ORAL_TABLET | Freq: Every day | ORAL | 0 refills | Status: DC

## 2020-04-26 NOTE — Progress Notes (Signed)
Virtual Visit via Video   I connected with Bland Span on 04/26/20 at  7:30 AM EST by a video enabled telemedicine application and verified that I am speaking with the correct person using two identifiers. Location patient: Home Location provider: [facility], Office Persons participating in the virtual visit: Shenia, Alan PA-C, Corky Mull, LPN   I discussed the limitations of evaluation and management by telemedicine and the availability of in person appointments. The patient expressed understanding and agreed to proceed.  Subjective:   HPI:   Patient is requesting evaluation for possible COVID-19.  Symptom onset: started on Monday, 2 positive home tests for COVID  Travel/contacts: No exposure that she is aware of, no travel.  Vaccination status: No  Patient endorses the following symptoms: subjective fever, sinus headache, sinus congestion, sinus pain, productive cough (expectorating clear sputum), wheezing, shortness of breath and myalgia, fever 99.1-100.9.  Patient denies the following symptoms: rhinorrhea, ear pain, sore throat, difficulty swallowing and chest pain  Treatments tried: Ibuprofen and Tylenol  She does have significant health risk factors: -heart failure -asthma -HTN  Patient risk factors: Current COVID-19 risk of complications score: 5 Smoking status: BRIHANY BUTCH  reports that she quit smoking about 30 years ago. Her smoking use included cigarettes. She has a 24.00 pack-year smoking history. She has never used smokeless tobacco. If female, currently pregnant? []   Yes [x]   No  ROS: See pertinent positives and negatives per HPI.  Patient Active Problem List   Diagnosis Date Noted  . Hormone replacement therapy (HRT) 07/29/2019  . IBS (irritable bowel syndrome) 11/10/2017  . B12 deficiency 11/10/2017  . OSA (obstructive sleep apnea) 11/10/2017  . Myalgia due to statin 11/10/2017  . Lumbar disc disease  10/01/2017  . RLS (restless legs syndrome) 10/01/2017  . Myofascial pain dysfunction syndrome 07/14/2017  . Cervical radiculopathy 07/14/2017  . Degenerative arthritis of left knee, XRAY12/2018 07/13/2017  . Morbid obesity (HCC) 02/24/2017  . Sjogren's disease (HCC) 02/24/2017  . Fibromyalgia 01/21/2017  . Chronic low back pain with sciatica 05/02/2015  . Chronic systolic congestive heart failure (HCC), EF 35-40% 03/22/2015  . Vitamin D deficiency 10/09/2009  . Depression with anxiety 08/31/2008  . Essential hypertension 08/31/2008  . Allergic rhinitis 08/31/2008  . Moderate persistent asthma 08/31/2008  . Esophageal reflux 08/31/2008  . Acquired hypothyroidism 08/30/2008  . Classical migraine without intractable migraine 08/30/2008    Social History   Tobacco Use  . Smoking status: Former Smoker    Packs/day: 2.00    Years: 12.00    Pack years: 24.00    Types: Cigarettes    Quit date: 05/06/1989    Years since quitting: 30.9  . Smokeless tobacco: Never Used  . Tobacco comment: quit smoking 1990  Substance Use Topics  . Alcohol use: No    Alcohol/week: 0.0 standard drinks    Current Outpatient Medications:  .  buPROPion (WELLBUTRIN XL) 300 MG 24 hr tablet, TAKE 1 TABLET BY MOUTH DAILY, Disp: 30 tablet, Rfl: 11 .  carvedilol (COREG) 12.5 MG tablet, TAKE ONE TABLET TWICE DAILY WITH A MEAL, Disp: 180 tablet, Rfl: 3 .  Cholecalciferol (VITAMIN D-3) 25 MCG (1000 UT) CAPS, Take 1,000 Units by mouth daily. , Disp: , Rfl:  .  cyclobenzaprine (FLEXERIL) 5 MG tablet, Take 1 tablet by mouth daily., Disp: , Rfl:  .  diazepam (VALIUM) 5 MG tablet, Take 1 tablet (5 mg total) by mouth daily as needed for anxiety., Disp: 30  tablet, Rfl: 2 .  empagliflozin (JARDIANCE) 10 MG TABS tablet, Take 1 tablet (10 mg total) by mouth daily before breakfast., Disp: 28 tablet, Rfl: 0 .  furosemide (LASIX) 20 MG tablet, TAKE ONE TABLET EVERY DAY, Disp: 90 tablet, Rfl: 3 .  Galcanezumab-gnlm 120 MG/ML  SOAJ, Inject 120 mg into the skin every 30 (thirty) days., Disp: , Rfl:  .  hydrocortisone (ANUSOL-HC) 2.5 % rectal cream, Place 1 application rectally 2 (two) times daily., Disp: 30 g, Rfl: 1 .  hydroxychloroquine (PLAQUENIL) 200 MG tablet, Take 2 tablets (400 mg total) by mouth every morning., Disp: 90 tablet, Rfl: 2 .  hydrOXYzine (ATARAX/VISTARIL) 25 MG tablet, Take 1 tablet (25 mg total) by mouth at bedtime as needed for itching (insomnia)., Disp: 180 tablet, Rfl: 1 .  levothyroxine (SYNTHROID) 100 MCG tablet, TAKE ONE TABLET EVERY DAY, Disp: 90 tablet, Rfl: 3 .  meloxicam (MOBIC) 7.5 MG tablet, TAKE ONE TABLET BY MOUTH EVERY DAY, Disp: 90 tablet, Rfl: 0 .  nystatin-triamcinolone ointment (MYCOLOG), Apply 1 application topically 2 (two) times daily., Disp: 30 g, Rfl: 5 .  omeprazole (PRILOSEC) 40 MG capsule, TAKE 1 CAPSULE EVERY DAY, Disp: 90 capsule, Rfl: 3 .  OVER THE COUNTER MEDICATION, Take 5 capsules by mouth daily. Ariix Optimals Vitamin & Minerals, Disp: , Rfl:  .  OVER THE COUNTER MEDICATION, Take 1 scoop by mouth daily. Ariix Magnecal D, Disp: , Rfl:  .  oxyCODONE-acetaminophen (PERCOCET) 10-325 MG tablet, Take 1 tablet by mouth 2 (two) times daily as needed for pain., Disp: 30 tablet, Rfl: 0 .  rizatriptan (MAXALT) 10 MG tablet, Take 10 mg by mouth as needed for migraine. May repeat in 2 hours if needed, Disp: , Rfl:  .  sacubitril-valsartan (ENTRESTO) 49-51 MG, Take 1 tablet by mouth 2 (two) times daily., Disp: 28 tablet, Rfl: 0 .  spironolactone (ALDACTONE) 25 MG tablet, TAKE ONE TABLET EVERY DAY, Disp: 90 tablet, Rfl: 3 .  topiramate (TOPAMAX) 25 MG tablet, Take 1 tablet (25 mg total) by mouth 2 (two) times daily., Disp: 60 tablet, Rfl: 3 .  Wheat Dextrin (BENEFIBER) POWD, Take 2 scoop by mouth daily as needed (for constipation). , Disp: , Rfl:  .  albuterol (VENTOLIN HFA) 108 (90 Base) MCG/ACT inhaler, Inhale 2 puffs into the lungs every 6 (six) hours as needed for wheezing or  shortness of breath., Disp: 8 g, Rfl: 3 .  Fluticasone-Salmeterol (ADVAIR) 100-50 MCG/DOSE AEPB, Inhale 1 puff into the lungs 2 (two) times daily., Disp: 1 each, Rfl: 3 .  predniSONE (DELTASONE) 20 MG tablet, Take 2 tablets (40 mg total) by mouth daily., Disp: 10 tablet, Rfl: 0  Allergies  Allergen Reactions  . Gabapentin (Once-Daily)     Depression   . Saxenda [Liraglutide -Weight Management] Other (See Comments)    Depression   . Sulfa Antibiotics     itching  . Trintellix [Vortioxetine] Other (See Comments)    GI issues   . Adhesive [Tape] Rash  . Codeine Itching    Objective:   VITALS: Per patient if applicable, see vitals. GENERAL: Alert, appears well and in no acute distress. HEENT: Atraumatic, conjunctiva clear, no obvious abnormalities on inspection of external nose and ears. NECK: Normal movements of the head and neck. CARDIOPULMONARY: No increased WOB. Speaking in clear sentences. I:E ratio WNL.  MS: Moves all visible extremities without noticeable abnormality. PSYCH: Pleasant and cooperative, well-groomed. Speech normal rate and rhythm. Affect is appropriate. Insight and judgement are appropriate. Attention  is focused, linear, and appropriate.  NEURO: CN grossly intact. Oriented as arrived to appointment on time with no prompting. Moves both UE equally.  SKIN: No obvious lesions, wounds, erythema, or cyanosis noted on face or hands.  Assessment and Plan:   Anabell was seen today for covid positive.  Diagnoses and all orders for this visit:  COVID-19  Other orders -     albuterol (VENTOLIN HFA) 108 (90 Base) MCG/ACT inhaler; Inhale 2 puffs into the lungs every 6 (six) hours as needed for wheezing or shortness of breath. -     Fluticasone-Salmeterol (ADVAIR) 100-50 MCG/DOSE AEPB; Inhale 1 puff into the lungs 2 (two) times daily. -     predniSONE (DELTASONE) 20 MG tablet; Take 2 tablets (40 mg total) by mouth daily.   No red flags on discussion, patient is not in  any obvious distress during our visit. Discussed progression of most viral illness, and recommended supportive care at this point in time.  I have started: -Albuterol prn -Advair BID -Oral prednisone -Sent antibody infusion team a message to reach out to her  Discussed over the counter supportive care options, with recommendations to push fluids and rest. Reviewed return precautions including new/worsening fever, SOB, new/worsening cough or other concerns.  Recommended need to self-quarantine and practice social distancing until symptoms resolve. Discussed current recommendations for COVID testing. I recommend that patient follow-up if symptoms worsen or persist despite treatment x 7-10 days, sooner if needed.  I discussed the assessment and treatment plan with the patient. The patient was provided an opportunity to ask questions and all were answered. The patient agreed with the plan and demonstrated an understanding of the instructions.   The patient was advised to call back or seek an in-person evaluation if the symptoms worsen or if the condition fails to improve as anticipated.   CMA or LPN served as scribe during this visit. History, Physical, and Plan performed by medical provider. The above documentation has been reviewed and is accurate and complete.   [place], [place] 04/26/2020

## 2020-04-27 ENCOUNTER — Encounter: Payer: Self-pay | Admitting: Unknown Physician Specialty

## 2020-04-27 ENCOUNTER — Telehealth: Payer: Self-pay | Admitting: Unknown Physician Specialty

## 2020-04-27 ENCOUNTER — Other Ambulatory Visit: Payer: Self-pay | Admitting: Unknown Physician Specialty

## 2020-04-27 DIAGNOSIS — U071 COVID-19: Secondary | ICD-10-CM

## 2020-04-27 DIAGNOSIS — J4540 Moderate persistent asthma, uncomplicated: Secondary | ICD-10-CM

## 2020-04-27 NOTE — Telephone Encounter (Signed)
I connected by phone with Kristine Curtis on 04/27/2020 at 5:25 PM to discuss the potential use of a new treatment for mild to moderate COVID-19 viral infection in non-hospitalized patients.  This patient is a 59 y.o. female that meets the FDA criteria for Emergency Use Authorization of COVID monoclonal antibody casirivimab/imdevimab, bamlanivimab/etesevimab, or sotrovimab.  Has a (+) direct SARS-CoV-2 viral test result  Has mild or moderate COVID-19   Is NOT hospitalized due to COVID-19  Is within 10 days of symptom onset  Has at least one of the high risk factor(s) for progression to severe COVID-19 and/or hospitalization as defined in EUA.  Specific high risk criteria : BMI > 25 and Cardiovascular disease or hypertension   I have spoken and communicated the following to the patient or parent/caregiver regarding COVID monoclonal antibody treatment:  1. FDA has authorized the emergency use for the treatment of mild to moderate COVID-19 in adults and pediatric patients with positive results of direct SARS-CoV-2 viral testing who are 6 years of age and older weighing at least 40 kg, and who are at high risk for progressing to severe COVID-19 and/or hospitalization.  2. The significant known and potential risks and benefits of COVID monoclonal antibody, and the extent to which such potential risks and benefits are unknown.  3. Information on available alternative treatments and the risks and benefits of those alternatives, including clinical trials.  4. Patients treated with COVID monoclonal antibody should continue to self-isolate and use infection control measures (e.g., wear mask, isolate, social distance, avoid sharing personal items, clean and disinfect "high touch" surfaces, and frequent handwashing) according to CDC guidelines.   5. The patient or parent/caregiver has the option to accept or refuse COVID monoclonal antibody treatment.  After reviewing this information with the  patient, the patient has agreed to receive one of the available covid 19 monoclonal antibodies and will be provided an appropriate fact sheet prior to infusion. Gabriel Cirri, NP 04/27/2020 5:25 PM Sx onset 12/20

## 2020-05-01 ENCOUNTER — Telehealth: Payer: Self-pay | Admitting: Pharmacist Clinician (PhC)/ Clinical Pharmacy Specialist

## 2020-05-01 ENCOUNTER — Ambulatory Visit (HOSPITAL_COMMUNITY)
Admission: RE | Admit: 2020-05-01 | Discharge: 2020-05-01 | Disposition: A | Discharge status: Home / Self Care | Payer: Medicare Other | Source: Ambulatory Visit | Attending: Pulmonary Disease | Admitting: Pulmonary Disease

## 2020-05-01 ENCOUNTER — Telehealth: Payer: Self-pay | Admitting: Cardiology

## 2020-05-01 DIAGNOSIS — J4540 Moderate persistent asthma, uncomplicated: Secondary | ICD-10-CM

## 2020-05-01 DIAGNOSIS — U071 COVID-19: Secondary | ICD-10-CM

## 2020-05-01 DIAGNOSIS — Z23 Encounter for immunization: Secondary | ICD-10-CM | POA: Diagnosis not present

## 2020-05-01 MED ORDER — EPINEPHRINE 0.3 MG/0.3ML IJ SOAJ: 0.3000 mg | Freq: Once | INTRAMUSCULAR | Status: DC | PRN

## 2020-05-01 MED ORDER — SODIUM CHLORIDE 0.9 % IV SOLN: Freq: Once | INTRAVENOUS | Status: AC

## 2020-05-01 MED ORDER — SODIUM CHLORIDE 0.9 % IV SOLN: INTRAVENOUS | Status: DC | PRN

## 2020-05-01 MED ORDER — FAMOTIDINE IN NACL 20-0.9 MG/50ML-% IV SOLN: 20.0000 mg | Freq: Once | INTRAVENOUS | Status: DC | PRN

## 2020-05-01 MED ORDER — METHYLPREDNISOLONE SODIUM SUCC 125 MG IJ SOLR: 125.0000 mg | Freq: Once | INTRAMUSCULAR | Status: DC | PRN

## 2020-05-01 MED ORDER — ALBUTEROL SULFATE HFA 108 (90 BASE) MCG/ACT IN AERS: 2.0000 | INHALATION_SPRAY | Freq: Once | RESPIRATORY_TRACT | Status: DC | PRN

## 2020-05-01 MED ORDER — DIPHENHYDRAMINE HCL 50 MG/ML IJ SOLN: 50.0000 mg | Freq: Once | INTRAMUSCULAR | Status: DC | PRN

## 2020-05-01 NOTE — Progress Notes (Signed)
Patient reviewed Fact Sheet for Patients, Parents, and Caregivers for Emergency Use Authorization (EUA) of casirivimab/imdevimab for the Treatment of Coronavirus.  Patient also reviewed and is agreeable to the estimated cost of treatment.  Patient is agreeable to proceed.  

## 2020-05-01 NOTE — Discharge Instructions (Signed)
10 Things You Can Do to Manage Your COVID-19 Symptoms at Home If you have possible or confirmed COVID-19: 1. Stay home from work and school. And stay away from other public places. If you must go out, avoid using any kind of public transportation, ridesharing, or taxis. 2. Monitor your symptoms carefully. If your symptoms get worse, call your healthcare provider immediately. 3. Get rest and stay hydrated. 4. If you have a medical appointment, call the healthcare provider ahead of time and tell them that you have or may have COVID-19. 5. For medical emergencies, call 911 and notify the dispatch personnel that you have or may have COVID-19. 6. Cover your cough and sneezes with a tissue or use the inside of your elbow. 7. Wash your hands often with soap and water for at least 20 seconds or clean your hands with an alcohol-based hand sanitizer that contains at least 60% alcohol. 8. As much as possible, stay in a specific room and away from other people in your home. Also, you should use a separate bathroom, if available. If you need to be around other people in or outside of the home, wear a mask. 9. Avoid sharing personal items with other people in your household, like dishes, towels, and bedding. 10. Clean all surfaces that are touched often, like counters, tabletops, and doorknobs. Use household cleaning sprays or wipes according to the label instructions. cdc.gov/coronavirus 11/04/2018 This information is not intended to replace advice given to you by your health care provider. Make sure you discuss any questions you have with your health care provider. Document Revised: 04/08/2019 Document Reviewed: 04/08/2019 Elsevier Patient Education  2020 Elsevier Inc. What types of side effects do monoclonal antibody drugs cause?  Common side effects  In general, the more common side effects caused by monoclonal antibody drugs include: . Allergic reactions, such as hives or itching . Flu-like signs and  symptoms, including chills, fatigue, fever, and muscle aches and pains . Nausea, vomiting . Diarrhea . Skin rashes . Low blood pressure   The CDC is recommending patients who receive monoclonal antibody treatments wait at least 90 days before being vaccinated.  Currently, there are no data on the safety and efficacy of mRNA COVID-19 vaccines in persons who received monoclonal antibodies or convalescent plasma as part of COVID-19 treatment. Based on the estimated half-life of such therapies as well as evidence suggesting that reinfection is uncommon in the 90 days after initial infection, vaccination should be deferred for at least 90 days, as a precautionary measure until additional information becomes available, to avoid interference of the antibody treatment with vaccine-induced immune responses. If you have any questions or concerns after the infusion please call the Advanced Practice Provider on call at [phone]. This number is ONLY intended for your use regarding questions or concerns about the infusion post-treatment side-effects.  Please do not provide this number to others for use. For return to work notes please contact your primary care provider.   If someone you know is interested in receiving treatment please have them call the COVID hotline at [phone].   

## 2020-05-01 NOTE — Telephone Encounter (Signed)
Patient should be seen before running out of Entresto samples.  Please re-schedule d/t sickness. Mau need to use 30 day free card to get additional medication.  We can not guarantee Belenda Cruise will see patient , but will schedule for f/u at [facility] office and add note to appointment.

## 2020-05-01 NOTE — Telephone Encounter (Signed)
Patient is returning Haleigh's call. Please advise.

## 2020-05-01 NOTE — Telephone Encounter (Signed)
     Pt is sick and would like to r/s her appt, however she wanted to make sure she will see pharmD Kristine Curtis

## 2020-05-01 NOTE — Progress Notes (Signed)
  Diagnosis: COVID-19  Physician:  Dr. Patrick Wright  Procedure: [facility] Med: casirivimab\imdevimab infusion - Provided patient with casirivimab\imdevimab fact sheet for patients, parents and caregivers prior to infusion.  Complications: No immediate complications noted.  Discharge: Discharged home   Freemon Binford Lynn 05/01/2020   

## 2020-05-01 NOTE — Telephone Encounter (Signed)
Called and lmomed the pt stated that we need a callback regarding entresto asap so that we can get rescheduled and perhaps give the pt a thirty day free entresto card. Will await callback

## 2020-05-01 NOTE — Telephone Encounter (Signed)
Called and spoke w/pt and explained we need to reschedule since they have covid for at least two weeks out and that they will stay on whatever dose of entresto that they were placed on. Also I am emailing the pt a 30 day free card of entresto so that they will not run out prior to the titration appt. Pt voiced understanding

## 2020-05-03 ENCOUNTER — Ambulatory Visit: Payer: Medicare Other

## 2020-05-06 DIAGNOSIS — B9681 Helicobacter pylori [H. pylori] as the cause of diseases classified elsewhere: Secondary | ICD-10-CM

## 2020-05-06 DIAGNOSIS — K2970 Gastritis, unspecified, without bleeding: Secondary | ICD-10-CM

## 2020-05-06 HISTORY — DX: Gastritis, unspecified, without bleeding: K29.70

## 2020-05-06 HISTORY — DX: Helicobacter pylori (H. pylori) as the cause of diseases classified elsewhere: B96.81

## 2020-05-17 ENCOUNTER — Other Ambulatory Visit: Payer: Self-pay

## 2020-05-17 ENCOUNTER — Ambulatory Visit (INDEPENDENT_AMBULATORY_CARE_PROVIDER_SITE_OTHER): Payer: Medicare Other | Admitting: Pharmacist

## 2020-05-17 VITALS — BP 112/76 | HR 88 | Wt 235.8 lb

## 2020-05-17 DIAGNOSIS — I10 Essential (primary) hypertension: Secondary | ICD-10-CM

## 2020-05-17 DIAGNOSIS — I5022 Chronic systolic (congestive) heart failure: Secondary | ICD-10-CM | POA: Diagnosis not present

## 2020-05-17 MED ORDER — EMPAGLIFLOZIN 10 MG PO TABS: 10.0000 mg | ORAL_TABLET | Freq: Every day | ORAL | 1 refills | Status: DC

## 2020-05-17 MED ORDER — ENTRESTO 49-51 MG PO TABS: 1.0000 | ORAL_TABLET | Freq: Two times a day (BID) | ORAL | 1 refills | Status: DC

## 2020-05-17 NOTE — Progress Notes (Signed)
HPI:  Kristine Curtis is a 60 y.o. female patient of Dr Swaziland, with a PMH below who presents today for heart failure medication titration. EF was at 35-40% on previous cardiac cath, but most recent limited echo showed an increase to 40-45%. We increased Entresto dose to 49/51mg  twice daily . She is also on carvedilol 12.5 mg twice daily, sprionolactone 25 mg daily, and furosemide 20 mg daily. She is recovering from COVID and still experiencing some cough. Otherwise, doing well and compliance with all therapy. Only compliant is feeling dry, but no BMET was done prior to today's appointment.    Past Medical History: hypertension Controlled on entresto and carvedilol  hypothyroidism levothyroixine 100 mcg  Chronic back pain On long term percocet/cyclobenzaprine prn     Blood Pressure Goal:  130/80  Current Medications:  Entresto 49/51 mg twice daily Carvedilol 12.5 mg twice daily Spironolactone 25 mg daily Furosemide 20mg  daily Jardiance 10mg  daily  Family Hx: father has AF, hypertension (started in late 2022-08-31); now 38; mother died at 43 from pancreatic cancer; one brother died last month at 40 - kidney failure/hypertension; another sister, brother, no specific issues; 2 children, daughter with gestational hypertension/preeclampsia; son with hypertension, recovering alcoholic, (his BP better now since he is 6 years sober)  Social Hx: no, no, is addicted to coke/pepsi - can drind as much as 24 cans over 3-4 days, but states may then go several days without  Diet: tries to eat more at home, but does eat out some, no added salt; tries to eat more grilled than fried (uses coconut oil spray).  Next 56 days eating plan; working with [facility] as well (has gained 9 pounds since starting in February).   Exercise: yoga 3 days per week - started recently  Home BP readings:  Intolerances: liraglutide caused depression  Labs:  9/21:  Na 142, K 3.8, Glu 99, BUN 9, SCr 0.9,  GFR 70  Wt Readings from Last 3 Encounters:  05/17/20 235 lb 12.8 oz (107 kg)  04/26/20 235 lb (106.6 kg)  04/13/20 242 lb (109.8 kg)   BP Readings from Last 3 Encounters:  05/17/20 112/76  05/01/20 124/62  04/13/20 116/80   Pulse Readings from Last 3 Encounters:  05/17/20 88  05/01/20 81  04/13/20 92    Current Outpatient Medications  Medication Sig Dispense Refill  . buPROPion (WELLBUTRIN XL) 300 MG 24 hr tablet TAKE 1 TABLET BY MOUTH DAILY 30 tablet 11  . carvedilol (COREG) 12.5 MG tablet TAKE ONE TABLET TWICE DAILY WITH A MEAL 180 tablet 3  . Cholecalciferol (VITAMIN D-3) 25 MCG (1000 UT) CAPS Take 1,000 Units by mouth daily.     . cyclobenzaprine (FLEXERIL) 5 MG tablet Take 1 tablet by mouth daily.    . diazepam (VALIUM) 5 MG tablet Take 1 tablet (5 mg total) by mouth daily as needed for anxiety. 30 tablet 2  . furosemide (LASIX) 20 MG tablet TAKE ONE TABLET EVERY DAY 90 tablet 3  . Galcanezumab-gnlm 120 MG/ML SOAJ Inject 120 mg into the skin every 30 (thirty) days.    . hydroxychloroquine (PLAQUENIL) 200 MG tablet Take 2 tablets (400 mg total) by mouth every morning. 90 tablet 2  . hydrOXYzine (ATARAX/VISTARIL) 25 MG tablet Take 1 tablet (25 mg total) by mouth at bedtime as needed for itching (insomnia). 180 tablet 1  . levothyroxine (SYNTHROID) 100 MCG tablet TAKE ONE TABLET EVERY DAY 90 tablet 3  . meloxicam (MOBIC)  7.5 MG tablet TAKE ONE TABLET BY MOUTH EVERY DAY 90 tablet 0  . nystatin-triamcinolone ointment (MYCOLOG) Apply 1 application topically 2 (two) times daily. 30 g 5  . omeprazole (PRILOSEC) 40 MG capsule TAKE 1 CAPSULE EVERY DAY 90 capsule 3  . OVER THE COUNTER MEDICATION Take 5 capsules by mouth daily. Ariix Optimals Vitamin & Minerals    . OVER THE COUNTER MEDICATION Take 1 scoop by mouth daily. Ariix Magnecal D    . oxyCODONE-acetaminophen (PERCOCET) 10-325 MG tablet Take 1 tablet by mouth 2 (two) times daily as needed for pain. 30 tablet 0  . rizatriptan  (MAXALT) 10 MG tablet Take 10 mg by mouth as needed for migraine. May repeat in 2 hours if needed    . spironolactone (ALDACTONE) 25 MG tablet TAKE ONE TABLET EVERY DAY 90 tablet 3  . topiramate (TOPAMAX) 25 MG tablet Take 1 tablet (25 mg total) by mouth 2 (two) times daily. 60 tablet 3  . Wheat Dextrin (BENEFIBER) POWD Take 2 scoop by mouth daily as needed (for constipation).     Marland Kitchen albuterol (VENTOLIN HFA) 108 (90 Base) MCG/ACT inhaler Inhale 2 puffs into the lungs every 6 (six) hours as needed for wheezing or shortness of breath. (Patient not taking: Reported on 05/17/2020) 8 g 3  . empagliflozin (JARDIANCE) 10 MG TABS tablet Take 1 tablet (10 mg total) by mouth daily before breakfast. 30 tablet 1  . Fluticasone-Salmeterol (ADVAIR) 100-50 MCG/DOSE AEPB Inhale 1 puff into the lungs 2 (two) times daily. (Patient not taking: Reported on 05/17/2020) 1 each 3  . hydrocortisone (ANUSOL-HC) 2.5 % rectal cream Place 1 application rectally 2 (two) times daily. (Patient not taking: Reported on 05/17/2020) 30 g 1  . sacubitril-valsartan (ENTRESTO) 49-51 MG Take 1 tablet by mouth 2 (two) times daily. 60 tablet 1   No current facility-administered medications for this visit.    Allergies  Allergen Reactions  . Gabapentin (Once-Daily)     Depression   . Saxenda [Liraglutide -Weight Management] Other (See Comments)    Depression   . Sulfa Antibiotics     itching  . Trintellix [Vortioxetine] Other (See Comments)    GI issues   . Adhesive [Tape] Rash  . Codeine Itching    Past Medical History:  Diagnosis Date  . Allergic rhinitis due to pollen   . Anxiety   . Asthma   . Carpal tunnel syndrome of right wrist 12/22/2017  . Chronic combined systolic and diastolic CHF (congestive heart failure) (HCC)    06/2016 Echo: EF 40-45%, Gr1 DD  . Chronic fatigue   . Depression   . Family history of polycystic kidney with negative CT 11/16/2012  . Fibromyalgia   . GERD (gastroesophageal reflux disease)   .  Hypertension   . Hypothyroidism   . IBS (irritable bowel syndrome)   . Internal hemorrhoids   . Lymphocytic colitis   . Morbid obesity (HCC)   . NICM (nonischemic cardiomyopathy) (HCC)    a. 1999 Cath: nl cors;  b. EF prev as low as 35%;  c. 11/2015 Echo: Ef 40-45%;  d. 06/2016 Echo: EF 40-45%;  e. Lexiscan MV: fixed anterosepta/inferseptal defect w/o ischemia, EF 35%.  . Osteoarthritis   . PONV (postoperative nausea and vomiting)   . PVC (premature ventricular contraction)   . Restless leg syndrome   . Sjogren's syndrome (HCC)   . Vertigo     Blood pressure 112/76, pulse 88, weight 235 lb 12.8 oz (107 kg).  Chronic systolic  congestive heart failure (HCC), EF 35-40% Blood pressure and HR remains appropriate for current therapy, but limiting for additional medication titration. Patient denies urinary incontinence, UTI symptoms, dizziness, or lightheadedness. Will change furosemide to 20mg  daily AS NEEDED. Continue Entresto 49/51mg  BID, continue Jardiance 10mg  daily, carvedilol 12.5mg  BID, and spironolactone 25mg  daily. Will repeat BMET today, and follow up with cardiologist in 6 weeks.   Natoria Archibald Rodriguez-Guzman PharmD, BCPS, CPP [facility] [address] [place] [facility] 05/23/2020 2:48 PM

## 2020-05-17 NOTE — Patient Instructions (Addendum)
Return for a  follow up appointment on Feb/28/2022 with Dr Swaziland  Go to the lab in TODAY  Check your blood pressure at home daily (if able) and keep record of the readings.  Take your BP meds as follows: *CONTINUE taking Entresto 49/51mg  twice daily *CONTINUE taking Jardiance 10mg  daily* *CHANGE furosemide to 20mg  daily as needed for fluid retention* *AYR saline gel for dry nares*  Bring all of your meds, your BP cuff and your record of home blood pressures to your next appointment.  Exercise as you're able, try to walk approximately 30 minutes per day.  Keep salt intake to a minimum, especially watch canned and prepared boxed foods.  Eat more fresh fruits and vegetables and fewer canned items.  Avoid eating in fast food restaurants.    HOW TO TAKE YOUR BLOOD PRESSURE: . Rest 5 minutes before taking your blood pressure. .  Don't smoke or drink caffeinated beverages for at least 30 minutes before. . Take your blood pressure before (not after) you eat. . Sit comfortably with your back supported and both feet on the floor (don't cross your legs). . Elevate your arm to heart level on a table or a desk. . Use the proper sized cuff. It should fit smoothly and snugly around your bare upper arm. There should be enough room to slip a fingertip under the cuff. The bottom edge of the cuff should be 1 inch above the crease of the elbow. . Ideally, take 3 measurements at one sitting and record the average.

## 2020-05-18 LAB — BASIC METABOLIC PANEL
BUN/Creatinine Ratio: 15 (ref 12–28)
BUN: 14 mg/dL (ref 8–27)
CO2: 19 mmol/L — ABNORMAL LOW (ref 20–29)
Calcium: 8.9 mg/dL (ref 8.7–10.3)
Chloride: 108 mmol/L — ABNORMAL HIGH (ref 96–106)
Creatinine, Ser: 0.96 mg/dL (ref 0.57–1.00)
GFR calc Af Amer: 74 mL/min/{1.73_m2} (ref >59)
GFR calc non Af Amer: 64 mL/min/{1.73_m2} (ref >59)
Glucose: 88 mg/dL (ref 65–99)
Potassium: 4.1 mmol/L (ref 3.5–5.2)
Sodium: 140 mmol/L (ref 134–144)

## 2020-05-23 ENCOUNTER — Encounter: Payer: Self-pay | Admitting: Pharmacist

## 2020-05-23 NOTE — Assessment & Plan Note (Signed)
Blood pressure and HR remains appropriate for current therapy, but limiting for additional medication titration. Patient denies urinary incontinence, UTI symptoms, dizziness, or lightheadedness. Will change furosemide to 20mg  daily AS NEEDED. Continue Entresto 49/51mg  BID, continue Jardiance 10mg  daily, carvedilol 12.5mg  BID, and spironolactone 25mg  daily. Will repeat BMET today, and follow up with cardiologist in 6 weeks.

## 2020-06-16 ENCOUNTER — Telehealth: Payer: Self-pay

## 2020-06-16 NOTE — Telephone Encounter (Signed)
Spoke to patient advised I was calling to see if you needed a prior [place] for [place]I received a message from one of our pharmacist asking if I did a prior auth.I have not received a  request from your pharmacy.Stated she will let me know if a prior Berkley Harvey is needed.

## 2020-06-22 ENCOUNTER — Other Ambulatory Visit: Payer: Self-pay | Admitting: Cardiology

## 2020-06-22 ENCOUNTER — Other Ambulatory Visit: Payer: Self-pay | Admitting: Family Medicine

## 2020-06-22 DIAGNOSIS — G8929 Other chronic pain: Secondary | ICD-10-CM

## 2020-07-01 NOTE — Progress Notes (Signed)
Cardiology Office Note    Date:  07/03/2020   ID:  Kristine Curtis, DOB [birth year], MRN [id number]  PCP:  Willow Ora, MD  Cardiologist:  Dr. Swaziland   Chief Complaint  Patient presents with  . Congestive Heart Failure    History of Present Illness:  Kristine Curtis is a 60 y.o. female with PMH of NICM, PVCs, HTN, fibromyalgia, morbid obesity, hypothyroidism, RLS and vertigo.She had a cardiac catheterization in 1999 that showed normal coronaries. EF previously as low as 35%,EFimproved to 40-45% by2017. Cardiac MRI in 2017 showed EF 44% with global hypokinesis, no scar or infiltration. She was admitted in February 2018 with 24 hour history of chest pain, dyspnea and headache. Cardiac enzyme were negative. Limited echocardiogram showed EF 40-45% without pericardial effusion. Stress test showed fixed anteroseptal and inferoseptal defect without ischemia. EF was measured 35%. Despite Myoview showing no ischemia, she continued to have exertional chest discomfort and subsequently underwent a left and right heart cath 07/11/2016 which showed normal coronaries. She also had normal cardiac output and EF 35-40%. She had a sleep study that showed in the past no obstructive sleep apnea.She does have restless leg syndrome  Patient was admitted in September 2018 with a episode of syncope.  Outpatient 30-day event monitor was negative for significant arrhythmia.  She has been followed by pharmacy and CHF therapy has been titrated. Now on Ryan, Coreg, aldactdone, and [place]. She did have Covid 19 in December. Since her Covid infection she continues to have intermittent cough, SOB, dizziness, HA, GI upset with diarrhea. She did receive monoclonal AB infusion. No edema. Is tolerating her cardiac meds well. Has not had to take any lasix in the last 8 weeks.     Past Medical History:  Diagnosis Date  . Allergic rhinitis due to pollen   . Anxiety   . Asthma   . Carpal tunnel syndrome of  right wrist 12/22/2017  . Chronic combined systolic and diastolic CHF (congestive heart failure) (HCC)    06/2016 Echo: EF 40-45%, Gr1 DD  . Chronic fatigue   . Depression   . Family history of polycystic kidney with negative CT 11/16/2012  . Fibromyalgia   . GERD (gastroesophageal reflux disease)   . Hypertension   . Hypothyroidism   . IBS (irritable bowel syndrome)   . Internal hemorrhoids   . Lymphocytic colitis   . Morbid obesity (HCC)   . NICM (nonischemic cardiomyopathy) (HCC)    a. 1999 Cath: nl cors;  b. EF prev as low as 35%;  c. 11/2015 Echo: Ef 40-45%;  d. 06/2016 Echo: EF 40-45%;  e. Lexiscan MV: fixed anterosepta/inferseptal defect w/o ischemia, EF 35%.  . Osteoarthritis   . PONV (postoperative nausea and vomiting)   . PVC (premature ventricular contraction)   . Restless leg syndrome   . Sjogren's syndrome (HCC)   . Vertigo     Past Surgical History:  Procedure Laterality Date  . ABDOMINAL HYSTERECTOMY    . CESAREAN SECTION    . CHOLECYSTECTOMY  10-2008  . COLONOSCOPY  02/14/2015  . ESOPHAGOGASTRODUODENOSCOPY    . EYE SURGERY     2 2013 and 1981/DCR of right eye 05/2014  . LUMBAR DISC SURGERY  2016   L3-4  . RIGHT/LEFT HEART CATH AND CORONARY ANGIOGRAPHY N/A 07/11/2016   Procedure: Right/Left Heart Cath and Coronary Angiography;  Surgeon: Lamarco Gudiel M Swaziland, MD;  Location: [facility] INVASIVE CV LAB;  Service: Cardiovascular;  Laterality: N/A;  . SHOULDER ARTHROSCOPY W/  SUBACROMIAL DECOMPRESSION AND DISTAL CLAVICLE EXCISION Right   . SPINE SURGERY     C6-C7, 03/2017  . TUBAL LIGATION  1992    Current Medications: Outpatient Medications Prior to Visit  Medication Sig Dispense Refill  . albuterol (VENTOLIN HFA) 108 (90 Base) MCG/ACT inhaler Inhale 2 puffs into the lungs every 6 (six) hours as needed for wheezing or shortness of breath. 8 g 3  . buPROPion (WELLBUTRIN XL) 300 MG 24 hr tablet TAKE 1 TABLET BY MOUTH DAILY 30 tablet 11  . carvedilol (COREG) 12.5 MG tablet TAKE ONE  TABLET TWICE DAILY WITH A MEAL 180 tablet 3  . Cholecalciferol (VITAMIN D-3) 25 MCG (1000 UT) CAPS Take 1,000 Units by mouth daily.     . cyclobenzaprine (FLEXERIL) 5 MG tablet Take 1 tablet by mouth daily.    . diazepam (VALIUM) 5 MG tablet Take 1 tablet (5 mg total) by mouth daily as needed for anxiety. 30 tablet 2  . ENTRESTO 49-51 MG TAKE 1 TABLET BY MOUTH TWICE DAILY 60 tablet 1  . Fluticasone-Salmeterol (ADVAIR) 100-50 MCG/DOSE AEPB Inhale 1 puff into the lungs 2 (two) times daily. 1 each 3  . furosemide (LASIX) 20 MG tablet TAKE ONE TABLET EVERY DAY 90 tablet 3  . Galcanezumab-gnlm 120 MG/ML SOAJ Inject 120 mg into the skin every 30 (thirty) days.    . hydrocortisone (ANUSOL-HC) 2.5 % rectal cream Place 1 application rectally 2 (two) times daily. 30 g 1  . hydroxychloroquine (PLAQUENIL) 200 MG tablet Take 2 tablets (400 mg total) by mouth every morning. 90 tablet 2  . hydrOXYzine (ATARAX/VISTARIL) 25 MG tablet Take 1 tablet (25 mg total) by mouth at bedtime as needed for itching (insomnia). 180 tablet 1  . JARDIANCE 10 MG TABS tablet TAKE ONE TABLET EACH MORNING BEFORE BREAKFAST 30 tablet 1  . levothyroxine (SYNTHROID) 100 MCG tablet TAKE ONE TABLET EVERY DAY 90 tablet 3  . meloxicam (MOBIC) 7.5 MG tablet TAKE ONE TABLET BY MOUTH EVERY DAY 90 tablet 0  . nystatin-triamcinolone ointment (MYCOLOG) Apply 1 application topically 2 (two) times daily. 30 g 5  . omeprazole (PRILOSEC) 40 MG capsule TAKE 1 CAPSULE EVERY DAY 90 capsule 3  . OVER THE COUNTER MEDICATION Take 5 capsules by mouth daily. Ariix Optimals Vitamin & Minerals    . OVER THE COUNTER MEDICATION Take 1 scoop by mouth daily. Ariix Magnecal D    . oxyCODONE-acetaminophen (PERCOCET) 10-325 MG tablet Take 1 tablet by mouth 2 (two) times daily as needed for pain. 30 tablet 0  . rizatriptan (MAXALT) 10 MG tablet Take 10 mg by mouth as needed for migraine. May repeat in 2 hours if needed    . spironolactone (ALDACTONE) 25 MG tablet TAKE  ONE TABLET EVERY DAY 90 tablet 3  . topiramate (TOPAMAX) 25 MG tablet Take 1 tablet (25 mg total) by mouth 2 (two) times daily. 60 tablet 3  . Wheat Dextrin (BENEFIBER) POWD Take 2 scoop by mouth daily as needed (for constipation).      No facility-administered medications prior to visit.     Allergies:   Gabapentin (once-daily), Saxenda [liraglutide -weight management], Sulfa antibiotics, Trintellix [vortioxetine], Adhesive [tape], and Codeine   Social History   Socioeconomic History  . Marital status: Married    Spouse name: Dorinda Hill  . Number of children: 2  . Years of education: Not on file  . Highest education level: Not on file  Occupational History  . Occupation: Nutritional therapist  . Occupation:  disablity  Tobacco Use  . Smoking status: Former Smoker    Packs/day: 2.00    Years: 12.00    Pack years: 24.00    Types: Cigarettes    Quit date: 05/06/1989    Years since quitting: 31.1  . Smokeless tobacco: Never Used  . Tobacco comment: quit smoking 1990  Vaping Use  . Vaping Use: Never used  Substance and Sexual Activity  . Alcohol use: No    Alcohol/week: 0.0 standard drinks  . Drug use: No  . Sexual activity: Yes  Other Topics Concern  . Not on file  Social History Narrative   Former Dr. Andrey Campanile then Dr. 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 Difficulty of Paying Living Expenses: Not hard at all  Food Insecurity: No Food Insecurity  . Worried About Programme researcher, broadcasting/film/video in the Last Year: Never true  . Ran Out of Food in the Last Year: Never true  Transportation Needs: No Transportation Needs  . Lack of Transportation (Medical): No  . Lack of Transportation (Non-Medical): No  Physical Activity: Insufficiently Active  . Days of Exercise per Week: 2 days  . Minutes of Exercise per Session: 20 min  Stress: No Stress Concern Present  . Feeling of Stress : Not at all  Social  Connections: Moderately Integrated  . Frequency of Communication with Friends and Family: More than three times a week  . Frequency of Social Gatherings with Friends and Family: Three times a week  . Attends Religious Services: 1 to 4 times per year  . Active Member of Clubs or Organizations: No  . Attends Banker Meetings: Never  . Marital Status: Married     Family History:  The patient's family history includes Alcoholism in her father; Depression in her mother; Diabetes in her father; Glaucoma in her father; Heart disease in her father; High blood pressure in her father; Kidney failure in her brother; Obesity in her father and mother; Pancreatic cancer in her mother; Thyroid cancer in her father and mother.   ROS:   Please see the history of present illness.    ROS All other systems reviewed and are negative.   PHYSICAL EXAM:   VS:  BP 112/74   Pulse 78   Ht 5\' 3"  (1.6 m)   Wt 235 lb 6.4 oz (106.8 kg)   SpO2 97%   BMI 41.70 kg/m    GEN: Well nourished, well developed, in no acute distress  HEENT: normal  Neck: no JVD, carotid bruits, or masses Cardiac: RRR; no murmurs, rubs, or gallops,no edema  Respiratory:  clear to auscultation bilaterally, normal work of breathing GI: soft, nontender, nondistended, + BS MS: no deformity or atrophy  Skin: warm and dry, no rash Neuro:  Alert and Oriented x 3, Strength and sensation are intact Psych: euthymic mood, full affect  Wt Readings from Last 3 Encounters:  07/03/20 235 lb 6.4 oz (106.8 kg)  05/17/20 235 lb 12.8 oz (107 kg)  04/26/20 235 lb (106.6 kg)      Studies/Labs Reviewed:   EKG:  EKG is  ordered today.  NSR rate 78. Nonspecific ST T changes. I have personally reviewed and interpreted this study.   Recent Labs: 01/31/2020: ALT 21; Hemoglobin 13.1; Platelets 240; TSH 3.83 05/17/2020: BUN 14; Creatinine, Ser 0.96; Potassium 4.1; Sodium 140   Lipid Panel    Component Value Date/Time  CHOL 155 01/31/2020  0906   TRIG 181 (H) 01/31/2020 0906   HDL 46 (L) 01/31/2020 0906   CHOLHDL 3.4 01/31/2020 0906   VLDL 26.4 02/03/2019 1429   LDLCALC 81 01/31/2020 0906   LDLDIRECT 75.0 03/20/2016 0836    Additional studies/ records that were reviewed today include:   Echo 06/28/2016 LV EF: 40% -   45% Study Conclusions  - Left ventricle: The cavity size was mildly dilated. Wall   thickness was increased in a pattern of mild LVH. Systolic   function was mildly to moderately reduced. The estimated ejection   fraction was in the range of 40% to 45%. Diffuse hypokinesis.   Doppler parameters are consistent with abnormal left ventricular   relaxation (grade 1 diastolic dysfunction). - Left atrium: The atrium was mildly dilated.  Impressions:  - Limited study to assess LV function; full doppler study not   performed; mild to moderate global reduction in LV systolic   function; grade 1 diastolic dysfunction; mild LVH; mild LVE; mild   LAE.   Cath 07/11/2016 Conclusion     There is moderate left ventricular systolic dysfunction.  The left ventricular ejection fraction is 35-45% by visual estimate.  LV end diastolic pressure is mildly elevated.  1. Normal coronary anatomy 2. Moderate LV dysfunction- global. EF 35-40%.  3. Mildly elevated LVEDP 4. Normal pulmonary pressures. RA pressure is elevated. 5. Normal cardiac output.  Plan: continue medical therapy. Consider addition of aldactone to current therapy      ASSESSMENT:    1. Chronic systolic heart failure (HCC)   2. Essential hypertension   3. NICM (nonischemic cardiomyopathy) (HCC)      PLAN:  In order of problems listed above:  1. Chronic systolic CHF with Nonischemic cardiomyopathy: EF 35 to 40% on previous cardiac catheterization/Echo. Medications have been optimized with Coreg, aldactone, Entresto, and Jardiance. We may be able to increase Entresto further but I don't want to change at this time when she is still  having so many issues with her Covid infection. We will arrange a follow up Echo now.  2. Hypertension: Blood pressure is controlled.   3. Hypothyroidism: Managed by primary care provider.  4. PVCs: this is well controlled on carvedilol.  5.   Obesity. Encourage weight loss.   6.   Covid 19 infection. Persistent symptoms.     Medication Adjustments/Labs and Tests Ordered: Current medicines are reviewed at length with the patient today.  Concerns regarding medicines are outlined above.  Medication changes, Labs and Tests ordered today are listed in the Patient Instructions below. There are no Patient Instructions on file for this visit.   Signed, Deriona Altemose Swaziland, MD  07/03/2020 4:28 PM    [facility] [address] [place], [place], [place]  [address] Phone: [phone]; Fax: [phone]

## 2020-07-03 ENCOUNTER — Other Ambulatory Visit: Payer: Self-pay

## 2020-07-03 ENCOUNTER — Encounter: Payer: Self-pay | Admitting: Cardiology

## 2020-07-03 ENCOUNTER — Ambulatory Visit (INDEPENDENT_AMBULATORY_CARE_PROVIDER_SITE_OTHER): Payer: Medicare Other | Admitting: Cardiology

## 2020-07-03 VITALS — BP 112/74 | HR 78 | Ht 63.0 in | Wt 235.4 lb

## 2020-07-03 DIAGNOSIS — I10 Essential (primary) hypertension: Secondary | ICD-10-CM | POA: Diagnosis not present

## 2020-07-03 DIAGNOSIS — I5022 Chronic systolic (congestive) heart failure: Secondary | ICD-10-CM | POA: Diagnosis not present

## 2020-07-03 DIAGNOSIS — I428 Other cardiomyopathies: Secondary | ICD-10-CM | POA: Diagnosis not present

## 2020-07-03 MED ORDER — FUROSEMIDE 20 MG PO TABS: 20.0000 mg | ORAL_TABLET | Freq: Every day | ORAL | 3 refills | Status: AC | PRN

## 2020-07-03 NOTE — Addendum Note (Signed)
Addended by: Neoma Laming on: 07/03/2020 04:34 PM   Modules accepted: Orders

## 2020-07-03 NOTE — Patient Instructions (Signed)
Medication Instructions:  Continue same medications *If you need a refill on your cardiac medications before your next appointment, please call your pharmacy*   Lab Work: None ordered   Testing/Procedures: Echo   Follow-Up: At [facility], you and your health needs are our priority.  As part of our continuing mission to provide you with exceptional heart care, we have created designated Provider Care Teams.  These Care Teams include your primary Cardiologist (physician) and Advanced Practice Providers (APPs -  Physician Assistants and Nurse Practitioners) who all work together to provide you with the care you need, when you need it.  We recommend signing up for the patient portal called "MyChart".  Sign up information is provided on this After Visit Summary.  MyChart is used to connect with patients for Virtual Visits (Telemedicine).  Patients are able to view lab/test results, encounter notes, upcoming appointments, etc.  Non-urgent messages can be sent to your provider as well.   To learn more about what you can do with MyChart, go to [email or web address].    Your next appointment:  6 months   Call in April to schedule August appointment    The format for your next appointment: Office   Provider:  Dr.Jordan

## 2020-07-31 ENCOUNTER — Encounter: Payer: Self-pay | Admitting: Family Medicine

## 2020-07-31 ENCOUNTER — Ambulatory Visit (INDEPENDENT_AMBULATORY_CARE_PROVIDER_SITE_OTHER): Payer: Medicare Other | Admitting: Family Medicine

## 2020-07-31 ENCOUNTER — Other Ambulatory Visit: Payer: Self-pay

## 2020-07-31 VITALS — BP 114/72 | HR 79 | Temp 97.5°F | Wt 234.6 lb

## 2020-07-31 DIAGNOSIS — M5441 Lumbago with sciatica, right side: Secondary | ICD-10-CM

## 2020-07-31 DIAGNOSIS — M797 Fibromyalgia: Secondary | ICD-10-CM

## 2020-07-31 DIAGNOSIS — I5022 Chronic systolic (congestive) heart failure: Secondary | ICD-10-CM | POA: Diagnosis not present

## 2020-07-31 DIAGNOSIS — G4709 Other insomnia: Secondary | ICD-10-CM

## 2020-07-31 DIAGNOSIS — U099 Post covid-19 condition, unspecified: Secondary | ICD-10-CM

## 2020-07-31 DIAGNOSIS — G8929 Other chronic pain: Secondary | ICD-10-CM

## 2020-07-31 DIAGNOSIS — F418 Other specified anxiety disorders: Secondary | ICD-10-CM | POA: Diagnosis not present

## 2020-07-31 DIAGNOSIS — I10 Essential (primary) hypertension: Secondary | ICD-10-CM

## 2020-07-31 DIAGNOSIS — M3500 Sicca syndrome, unspecified: Secondary | ICD-10-CM

## 2020-07-31 DIAGNOSIS — E039 Hypothyroidism, unspecified: Secondary | ICD-10-CM

## 2020-07-31 DIAGNOSIS — Z7185 Encounter for immunization safety counseling: Secondary | ICD-10-CM

## 2020-07-31 LAB — COMPREHENSIVE METABOLIC PANEL
ALT: 22 U/L (ref 0–35)
AST: 20 U/L (ref 0–37)
Albumin: 3.8 g/dL (ref 3.5–5.2)
Alkaline Phosphatase: 80 U/L (ref 39–117)
BUN: 15 mg/dL (ref 6–23)
CO2: 25 mEq/L (ref 19–32)
Calcium: 8.9 mg/dL (ref 8.4–10.5)
Chloride: 110 mEq/L (ref 96–112)
Creatinine, Ser: 1.02 mg/dL (ref 0.40–1.20)
GFR: 59.83 mL/min — ABNORMAL LOW (ref >60.00)
Glucose, Bld: 100 mg/dL — ABNORMAL HIGH (ref 70–99)
Potassium: 3.5 mEq/L (ref 3.5–5.1)
Sodium: 143 mEq/L (ref 135–145)
Total Bilirubin: 0.5 mg/dL (ref 0.2–1.2)
Total Protein: 5.8 g/dL — ABNORMAL LOW (ref 6.0–8.3)

## 2020-07-31 LAB — CBC WITH DIFFERENTIAL/PLATELET
Basophils Absolute: 0 10*3/uL (ref 0.0–0.1)
Basophils Relative: 0.6 % (ref 0.0–3.0)
Eosinophils Absolute: 0.1 10*3/uL (ref 0.0–0.7)
Eosinophils Relative: 3.2 % (ref 0.0–5.0)
HCT: 39.3 % (ref 36.0–46.0)
Hemoglobin: 13.2 g/dL (ref 12.0–15.0)
Lymphocytes Relative: 38.7 % (ref 12.0–46.0)
Lymphs Abs: 1.8 10*3/uL (ref 0.7–4.0)
MCHC: 33.7 g/dL (ref 30.0–36.0)
MCV: 94.7 fl (ref 78.0–100.0)
Monocytes Absolute: 0.3 10*3/uL (ref 0.1–1.0)
Monocytes Relative: 7.7 % (ref 3.0–12.0)
Neutro Abs: 2.3 10*3/uL (ref 1.4–7.7)
Neutrophils Relative %: 49.8 % (ref 43.0–77.0)
Platelets: 202 10*3/uL (ref 150.0–400.0)
RBC: 4.15 Mil/uL (ref 3.87–5.11)
RDW: 13.2 % (ref 11.5–15.5)
WBC: 4.6 10*3/uL (ref 4.0–10.5)

## 2020-07-31 LAB — TSH: TSH: 2.49 u[IU]/mL (ref 0.35–4.50)

## 2020-07-31 MED ORDER — SHINGRIX 50 MCG/0.5ML IM SUSR: 0.5000 mL | Freq: Once | INTRAMUSCULAR | 0 refills | Status: AC

## 2020-07-31 NOTE — Progress Notes (Signed)
Subjective  CC:  Chief Complaint  Patient presents with  . Hypertension  . Insomnia    Trouble falling asleep and staying asleep, sleeping mostly during the day   . post covid    COVID in December, still experiencing symptoms such a fever, cough, body chills     HPI: Kristine Curtis is a 60 y.o. female who presents to the office today to address the problems listed above in the chief complaint.  Hypertension f/u: Control is good . Pt reports she is doing well. taking medications as instructed, no medication side effects noted, no TIAs, no chest pain on exertion, no dyspnea on exertion, no swelling of ankles. She denies adverse effects from his BP medications. Compliance with medication is good.   Chronic heart failure: Reviewed recent cardiology notes.  Volume status is stable.  Blood pressures well controlled.  Has not needed Lasix.  Holding off on adjusting Entresto dose due to Covid symptoms  Had Covid back in December.  Complains of persistent symptoms including cycling GI symptoms, fevers, cough, sinus congestion, body aches.  She did receive monoclonal antibody infusion.  She is not vaccinated.  She remains hesitant.  Insomnia: Having trouble staying asleep.  Could be related to COVID or psychosocial.  She lost her brother in November.  Mood is well controlled  Fibromyalgia is controlled.  Assessment  1. Essential hypertension   2. Chronic systolic congestive heart failure (HCC), EF 35-40%   3. Chronic right-sided low back pain with right-sided sciatica   4. Depression with anxiety   5. Fibromyalgia   6. COVID-19 long hauler   7. Secondary insomnia   8. Vaccine counseling   9. Acquired hypothyroidism   10. Sjogren's syndrome, with unspecified organ involvement (HCC) Chronic  11. Morbid obesity (HCC) Chronic      Plan    Hypertension f/u: BP control is well controlled.  Continue current medications  COVID f/u: Continues to have symptoms.  Appears well today.   Check lab work.  Vaccine counseling given.  Patient remains hesitant to receive Covid vaccines  Eligible for Shingrix.  Prescription given.  She will consider it  Depression and fibromyalgia are controlled  Secondary insomnia: Trial of melatonin or Tylenol PM.  Avoid prescription medications at this time given her polypharmacy  Heart failure: Continue Jardiance, Entresto and beta-blocker  Hypothyroidism: Recheck TSH given multiple symptoms Education regarding management of these chronic disease states was given. Management strategies discussed on successive visits include dietary and exercise recommendations, goals of achieving and maintaining IBW, and lifestyle modifications aiming for adequate sleep and minimizing stressors.   Follow up: 6 months for physical  Orders Placed This Encounter  Procedures  . CBC with Differential/Platelet  . Comprehensive metabolic panel  . TSH   Meds ordered this encounter  Medications  . Zoster Vaccine Adjuvanted [facility]) injection    Sig: Inject 0.5 mLs into the muscle once for 1 dose. Please give 2nd dose 2-6 months after first dose    Dispense:  2 each    Refill:  0      BP Readings from Last 3 Encounters:  07/31/20 114/72  07/03/20 112/74  05/17/20 112/76   Wt Readings from Last 3 Encounters:  07/31/20 234 lb 9.6 oz (106.4 kg)  07/03/20 235 lb 6.4 oz (106.8 kg)  05/17/20 235 lb 12.8 oz (107 kg)    Lab Results  Component Value Date   CHOL 155 01/31/2020   CHOL 170 02/03/2019   CHOL 140 07/14/2017  Lab Results  Component Value Date   HDL 46 (L) 01/31/2020   HDL 50.20 02/03/2019   HDL 43.00 07/14/2017   Lab Results  Component Value Date   LDLCALC 81 01/31/2020   LDLCALC 93 02/03/2019   LDLCALC 61 07/14/2017   Lab Results  Component Value Date   TRIG 181 (H) 01/31/2020   TRIG 132.0 02/03/2019   TRIG 181.0 (H) 07/14/2017   Lab Results  Component Value Date   CHOLHDL 3.4 01/31/2020   CHOLHDL 3 02/03/2019   CHOLHDL  3 07/14/2017   Lab Results  Component Value Date   LDLDIRECT 75.0 03/20/2016   Lab Results  Component Value Date   CREATININE 0.96 05/17/2020   BUN 14 05/17/2020   NA 140 05/17/2020   K 4.1 05/17/2020   CL 108 (H) 05/17/2020   CO2 19 (L) 05/17/2020    The 10-year ASCVD risk score [name] DC Jr., et al., 2013) is: 3.3%   Values used to calculate the score:     Age: 54 years     Sex: Female     Is Non-Hispanic African American: No     Diabetic: No     Tobacco smoker: No     Systolic Blood Pressure: 114 mmHg     Is BP treated: Yes     HDL Cholesterol: 46 mg/dL     Total Cholesterol: 155 mg/dL  I reviewed the patients updated PMH, FH, and SocHx.    Patient Active Problem List   Diagnosis Date Noted  . OSA (obstructive sleep apnea) 11/10/2017    Priority: High  . Myofascial pain dysfunction syndrome 07/14/2017    Priority: High  . Morbid obesity (HCC) 02/24/2017    Priority: High  . Sjogren's disease (HCC) 02/24/2017    Priority: High  . Fibromyalgia 01/21/2017    Priority: High  . Chronic low back pain with sciatica 05/02/2015    Priority: High  . Chronic systolic congestive heart failure (HCC), EF 35-40% 03/22/2015    Priority: High  . Depression with anxiety 08/31/2008    Priority: High  . Essential hypertension 08/31/2008    Priority: High  . Acquired hypothyroidism 08/30/2008    Priority: High  . Hormone replacement therapy (HRT) 07/29/2019    Priority: Medium  . IBS (irritable bowel syndrome) 11/10/2017    Priority: Medium  . RLS (restless legs syndrome) 10/01/2017    Priority: Medium  . Moderate persistent asthma 08/31/2008    Priority: Medium  . Esophageal reflux 08/31/2008    Priority: Medium  . Classical migraine without intractable migraine 08/30/2008    Priority: Medium  . B12 deficiency 11/10/2017    Priority: Low  . Myalgia due to statin 11/10/2017    Priority: Low  . Degenerative arthritis of left knee, XRAY12/2018 07/13/2017    Priority:  Low  . Vitamin D deficiency 10/09/2009    Priority: Low  . Allergic rhinitis 08/31/2008    Priority: Low  . Lumbar disc disease 10/01/2017  . Cervical radiculopathy 07/14/2017    Allergies: Gabapentin (once-daily), Saxenda [liraglutide -weight management], Sulfa antibiotics, Trintellix [vortioxetine], Adhesive [tape], and Codeine  Social History: Patient  reports that she quit smoking about 31 years ago. Her smoking use included cigarettes. She has a 24.00 pack-year smoking history. She has never used smokeless tobacco. She reports that she does not drink alcohol and does not use drugs.  Current Meds  Medication Sig  . albuterol (VENTOLIN HFA) 108 (90 Base) MCG/ACT inhaler Inhale 2 puffs into the  lungs every 6 (six) hours as needed for wheezing or shortness of breath.  Marland Kitchen buPROPion (WELLBUTRIN XL) 300 MG 24 hr tablet TAKE 1 TABLET BY MOUTH DAILY  . carvedilol (COREG) 12.5 MG tablet TAKE ONE TABLET TWICE DAILY WITH A MEAL  . Cholecalciferol (VITAMIN D-3) 25 MCG (1000 UT) CAPS Take 1,000 Units by mouth daily.   . cyclobenzaprine (FLEXERIL) 5 MG tablet Take 1 tablet by mouth daily.  . diazepam (VALIUM) 5 MG tablet Take 1 tablet (5 mg total) by mouth daily as needed for anxiety.  Marland Kitchen ENTRESTO 49-51 MG TAKE 1 TABLET BY MOUTH TWICE DAILY  . Fluticasone-Salmeterol (ADVAIR) 100-50 MCG/DOSE AEPB Inhale 1 puff into the lungs 2 (two) times daily.  . furosemide (LASIX) 20 MG tablet Take 1 tablet (20 mg total) by mouth daily as needed for edema.  . Galcanezumab-gnlm 120 MG/ML SOAJ Inject 120 mg into the skin every 30 (thirty) days.  . hydrocortisone (ANUSOL-HC) 2.5 % rectal cream Place 1 application rectally 2 (two) times daily.  . hydroxychloroquine (PLAQUENIL) 200 MG tablet Take 2 tablets (400 mg total) by mouth every morning.  . hydrOXYzine (ATARAX/VISTARIL) 25 MG tablet Take 1 tablet (25 mg total) by mouth at bedtime as needed for itching (insomnia).  Marland Kitchen JARDIANCE 10 MG TABS tablet TAKE ONE TABLET  EACH MORNING BEFORE BREAKFAST  . levothyroxine (SYNTHROID) 100 MCG tablet TAKE ONE TABLET EVERY DAY  . meloxicam (MOBIC) 7.5 MG tablet TAKE ONE TABLET BY MOUTH EVERY DAY  . nystatin-triamcinolone ointment (MYCOLOG) Apply 1 application topically 2 (two) times daily.  Marland Kitchen omeprazole (PRILOSEC) 40 MG capsule TAKE 1 CAPSULE EVERY DAY  . OVER THE COUNTER MEDICATION Take 5 capsules by mouth daily. [employer] Optimals Vitamin & Minerals  . OVER THE COUNTER MEDICATION Take 1 scoop by mouth daily. Ariix Magnecal D  . oxyCODONE-acetaminophen (PERCOCET) 10-325 MG tablet Take 1 tablet by mouth 2 (two) times daily as needed for pain.  . rizatriptan (MAXALT) 10 MG tablet Take 10 mg by mouth as needed for migraine. May repeat in 2 hours if needed  . spironolactone (ALDACTONE) 25 MG tablet TAKE ONE TABLET EVERY DAY  . topiramate (TOPAMAX) 25 MG tablet Take 1 tablet (25 mg total) by mouth 2 (two) times daily.  . Wheat Dextrin (BENEFIBER) POWD Take 2 scoop by mouth daily as needed (for constipation).   . Zoster Vaccine Adjuvanted [facility]) injection Inject 0.5 mLs into the muscle once for 1 dose. Please give 2nd dose 2-6 months after first dose    Review of Systems: Cardiovascular: negative for chest pain, palpitations, leg swelling, orthopnea Respiratory: negative for SOB, wheezing or persistent cough Gastrointestinal: negative for abdominal pain Genitourinary: negative for dysuria or gross hematuria  Objective  Vitals: BP 114/72   Pulse 79   Temp (!) 97.5 F (36.4 C) (Temporal)   Wt 234 lb 9.6 oz (106.4 kg)   SpO2 99%   BMI 41.56 kg/m  General: no acute distress  Psych:  Alert and oriented, normal mood and affect HEENT:  Normocephalic, atraumatic, supple neck  Cardiovascular:  RRR without murmur. no edema Respiratory:  Good breath sounds bilaterally, CTAB with normal respiratory effort Skin:  Warm, no rashes Neurologic:   Mental status is normal  Commons side effects, risks, benefits, and  alternatives for medications and treatment plan prescribed today were discussed, and the patient expressed understanding of the given instructions. Patient is instructed to call or message via MyChart if he/she has any questions or concerns regarding our treatment plan.  No barriers to understanding were identified. We discussed Red Flag symptoms and signs in detail. Patient expressed understanding regarding what to do in case of urgent or emergency type symptoms.   Medication list was reconciled, printed and provided to the patient in AVS. Patient instructions and summary information was reviewed with the patient as documented in the AVS. This note was prepared with assistance of Dragon voice recognition software. Occasional wrong-word or sound-a-like substitutions may have occurred due to the inherent limitations of voice recognition software  This visit occurred during the SARS-CoV-2 public health emergency.  Safety protocols were in place, including screening questions prior to the visit, additional usage of staff PPE, and extensive cleaning of exam room while observing appropriate contact time as indicated for disinfecting solutions.

## 2020-07-31 NOTE — Patient Instructions (Addendum)
Please return in 6 months for your annual complete physical; please come fasting.  Please take the prescription for Shingrix to the pharmacy so they may administer the vaccinations. Your insurance will then cover the injections.   If you have any questions or concerns, please don't hesitate to send me a message via MyChart or call the office at [phone]. Thank you for visiting with [place] today! It's our pleasure caring for you.   Insomnia Insomnia is a sleep disorder that makes it difficult to fall asleep or stay asleep. Insomnia can cause fatigue, low energy, difficulty concentrating, mood swings, and poor performance at work or school. There are three different ways to classify insomnia:  Difficulty falling asleep.  Difficulty staying asleep.  Waking up too early in the morning. Any type of insomnia can be long-term (chronic) or short-term (acute). Both are common. Short-term insomnia usually lasts for three months or less. Chronic insomnia occurs at least three times a week for longer than three months. What are the causes? Insomnia may be caused by another condition, situation, or substance, such as:  Anxiety.  Certain medicines.  Gastroesophageal reflux disease (GERD) or other gastrointestinal conditions.  Asthma or other breathing conditions.  Restless legs syndrome, sleep apnea, or other sleep disorders.  Chronic pain.  Menopause.  Stroke.  Abuse of alcohol, tobacco, or illegal drugs.  Mental health conditions, such as depression.  Caffeine.  Neurological disorders, such as Alzheimer's disease.  An overactive thyroid (hyperthyroidism). Sometimes, the cause of insomnia may not be known. What increases the risk? Risk factors for insomnia include:  Gender. Women are affected more often than men.  Age. Insomnia is more common as you get older.  Stress.  Lack of exercise.  Irregular work schedule or working night shifts.  Traveling between different time  zones.  Certain medical and mental health conditions. What are the signs or symptoms? If you have insomnia, the main symptom is having trouble falling asleep or having trouble staying asleep. This may lead to other symptoms, such as:  Feeling fatigued or having low energy.  Feeling nervous about going to sleep.  Not feeling rested in the morning.  Having trouble concentrating.  Feeling irritable, anxious, or depressed. How is this diagnosed? This condition may be diagnosed based on:  Your symptoms and medical history. Your health care provider may ask about: ? Your sleep habits. ? Any medical conditions you have. ? Your mental health.  A physical exam. How is this treated? Treatment for insomnia depends on the cause. Treatment may focus on treating an underlying condition that is causing insomnia. Treatment may also include:  Medicines to help you sleep.  Counseling or therapy.  Lifestyle adjustments to help you sleep better. Follow these instructions at home: Eating and drinking  Limit or avoid alcohol, caffeinated beverages, and cigarettes, especially close to bedtime. These can disrupt your sleep.  Do not eat a large meal or eat spicy foods right before bedtime. This can lead to digestive discomfort that can make it hard for you to sleep.   Sleep habits  Keep a sleep diary to help you and your health care provider figure out what could be causing your insomnia. Write down: ? When you sleep. ? When you wake up during the night. ? How well you sleep. ? How rested you feel the next day. ? Any side effects of medicines you are taking. ? What you eat and drink.  Make your bedroom a dark, comfortable place where it is easy  to fall asleep. ? Put up shades or blackout curtains to block light from outside. ? Use a white noise machine to block noise. ? Keep the temperature cool.  Limit screen use before bedtime. This includes: ? Watching TV. ? Using your smartphone,  tablet, or computer.  Stick to a routine that includes going to bed and waking up at the same times every day and night. This can help you fall asleep faster. Consider making a quiet activity, such as reading, part of your nighttime routine.  Try to avoid taking naps during the day so that you sleep better at night.  Get out of bed if you are still awake after 15 minutes of trying to sleep. Keep the lights down, but try reading or doing a quiet activity. When you feel sleepy, go back to bed.   General instructions  Take over-the-counter and prescription medicines only as told by your health care provider.  Exercise regularly, as told by your health care provider. Avoid exercise starting several hours before bedtime.  Use relaxation techniques to manage stress. Ask your health care provider to suggest some techniques that may work well for you. These may include: ? Breathing exercises. ? Routines to release muscle tension. ? Visualizing peaceful scenes.  Make sure that you drive carefully. Avoid driving if you feel very sleepy.  Keep all follow-up visits as told by your health care provider. This is important. Contact a health care provider if:  You are tired throughout the day.  You have trouble in your daily routine due to sleepiness.  You continue to have sleep problems, or your sleep problems get worse. Get help right away if:  You have serious thoughts about hurting yourself or someone else. If you ever feel like you may hurt yourself or others, or have thoughts about taking your own life, get help right away. You can go to your nearest emergency department or call:  Your local emergency services (911 in the [place]  A suicide crisis helpline, such as the National Suicide Prevention Lifeline at 9063379344. This is open 24 hours a day. Summary  Insomnia is a sleep disorder that makes it difficult to fall asleep or stay asleep.  Insomnia can be long-term (chronic) or  short-term (acute).  Treatment for insomnia depends on the cause. Treatment may focus on treating an underlying condition that is causing insomnia.  Keep a sleep diary to help you and your health care provider figure out what could be causing your insomnia. This information is not intended to replace advice given to you by your health care provider. Make sure you discuss any questions you have with your health care provider. Document Revised: 03/02/2020 Document Reviewed: 03/02/2020 Elsevier Patient Education  2021 [employer].

## 2020-08-07 ENCOUNTER — Other Ambulatory Visit: Payer: Self-pay

## 2020-08-07 ENCOUNTER — Ambulatory Visit (HOSPITAL_COMMUNITY): Payer: Medicare Other | Attending: Cardiology

## 2020-08-07 DIAGNOSIS — I10 Essential (primary) hypertension: Secondary | ICD-10-CM | POA: Diagnosis not present

## 2020-08-07 DIAGNOSIS — I5022 Chronic systolic (congestive) heart failure: Secondary | ICD-10-CM | POA: Diagnosis not present

## 2020-08-07 DIAGNOSIS — I428 Other cardiomyopathies: Secondary | ICD-10-CM | POA: Insufficient documentation

## 2020-08-07 LAB — ECHOCARDIOGRAM COMPLETE
Area-P 1/2: 2.99 cm2
S' Lateral: 3.8 cm

## 2020-08-09 ENCOUNTER — Encounter: Payer: Self-pay | Admitting: Family Medicine

## 2020-08-21 ENCOUNTER — Other Ambulatory Visit: Payer: Self-pay | Admitting: Cardiology

## 2020-08-21 NOTE — Telephone Encounter (Signed)
Rx has been sent to the pharmacy electronically. ° °

## 2020-08-24 ENCOUNTER — Encounter: Payer: Self-pay | Admitting: Family

## 2020-08-24 ENCOUNTER — Telehealth (INDEPENDENT_AMBULATORY_CARE_PROVIDER_SITE_OTHER): Payer: Medicare Other | Admitting: Family

## 2020-08-24 VITALS — BP 101/81 | HR 79 | Temp 96.6°F | Ht 63.0 in | Wt 232.0 lb

## 2020-08-24 DIAGNOSIS — R059 Cough, unspecified: Secondary | ICD-10-CM | POA: Diagnosis not present

## 2020-08-24 DIAGNOSIS — J069 Acute upper respiratory infection, unspecified: Secondary | ICD-10-CM

## 2020-08-24 DIAGNOSIS — J208 Acute bronchitis due to other specified organisms: Secondary | ICD-10-CM

## 2020-08-24 MED ORDER — PREDNISONE 10 MG (21) PO TBPK: ORAL_TABLET | ORAL | 0 refills | Status: DC

## 2020-08-24 MED ORDER — BENZONATATE 200 MG PO CAPS: 200.0000 mg | ORAL_CAPSULE | Freq: Two times a day (BID) | ORAL | 0 refills | Status: DC | PRN

## 2020-08-27 NOTE — Progress Notes (Signed)
Acute Office Visit  Subjective:    Patient ID: Kristine Curtis, female    DOB: June 25, 1960, 60 y.o.   MRN: [id number]  Chief Complaint  Patient presents with  . Cough    Pt c/o cough since Monday expectorating milky white sputum. Headache, sore throat, ears hurt, chest hurts with coughing. Fatigue some body aches, Fever 98..9 took Tylenol now 96.6. Home Covid test on Tuesday and today both were Neg. Taking OTC allergy med and Mucinex.    HPI Patient is in today with c/o cough with white milky sputum, congestion, body aches, and fatigues x 3 days without improvement. She has been taking OTC meds without much relief.   Past Medical History:  Diagnosis Date  . Allergic rhinitis due to pollen   . Anxiety   . Asthma   . Carpal tunnel syndrome of right wrist 12/22/2017  . Chronic combined systolic and diastolic CHF (congestive heart failure) (HCC)    06/2016 Echo: EF 40-45%, Gr1 DD  . Chronic fatigue   . Depression   . Family history of polycystic kidney with negative CT 11/16/2012  . Fibromyalgia   . GERD (gastroesophageal reflux disease)   . Hypertension   . Hypothyroidism   . IBS (irritable bowel syndrome)   . Internal hemorrhoids   . Lymphocytic colitis   . Morbid obesity (HCC)   . NICM (nonischemic cardiomyopathy) (HCC)    a. 1999 Cath: nl cors;  b. EF prev as low as 35%;  c. 11/2015 Echo: Ef 40-45%;  d. 06/2016 Echo: EF 40-45%;  e. Lexiscan MV: fixed anterosepta/inferseptal defect w/o ischemia, EF 35%.  . Osteoarthritis   . PONV (postoperative nausea and vomiting)   . PVC (premature ventricular contraction)   . Restless leg syndrome   . Sjogren's syndrome (HCC)   . Vertigo     Past Surgical History:  Procedure Laterality Date  . ABDOMINAL HYSTERECTOMY    . CESAREAN SECTION    . CHOLECYSTECTOMY  10-2008  . COLONOSCOPY  02/14/2015  . ESOPHAGOGASTRODUODENOSCOPY    . EYE SURGERY     2 2013 and 1981/DCR of right eye 05/2014  . LUMBAR DISC SURGERY  2016   L3-4  .  RIGHT/LEFT HEART CATH AND CORONARY ANGIOGRAPHY N/A 07/11/2016   Procedure: Right/Left Heart Cath and Coronary Angiography;  Surgeon: Peter M Swaziland, MD;  Location: [facility] INVASIVE CV LAB;  Service: Cardiovascular;  Laterality: N/A;  . SHOULDER ARTHROSCOPY W/ SUBACROMIAL DECOMPRESSION AND DISTAL CLAVICLE EXCISION Right   . SPINE SURGERY     C6-C7, 03/2017  . TUBAL LIGATION  1992    Family History  Problem Relation Age of Onset  . Pancreatic cancer Mother   . Thyroid cancer Mother   . Depression Mother   . Obesity Mother   . Diabetes Father   . Heart disease Father   . Glaucoma Father   . High blood pressure Father   . Thyroid cancer Father   . Alcoholism Father   . Obesity Father   . Kidney failure Brother   . Colon cancer Neg Hx   . Stomach cancer Neg Hx   . Esophageal cancer Neg Hx   . Colon polyps Neg Hx   . Rectal cancer Neg Hx     Social History   Socioeconomic History  . Marital status: Married    Spouse name: Dorinda Hill  . Number of children: 2  . Years of education: Not on file  . Highest education level: Not on file  Occupational  History  . Occupation: Nutritional therapist  . Occupation: disablity  Tobacco Use  . Smoking status: Former Smoker    Packs/day: 2.00    Years: 12.00    Pack years: 24.00    Types: Cigarettes    Quit date: 05/06/1989    Years since quitting: 31.3  . Smokeless tobacco: Never Used  . Tobacco comment: quit smoking 1990  Vaping Use  . Vaping Use: Never used  Substance and Sexual Activity  . Alcohol use: No    Alcohol/week: 0.0 standard drinks  . Drug use: No  . Sexual activity: Yes  Other Topics Concern  . Not on file  Social History Narrative   Former Dr. Andrey Campanile then Dr. 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 Difficulty of Paying Living Expenses: Not hard at all  Food Insecurity: No Food Insecurity  . Worried About Programme researcher, broadcasting/film/video in the Last  Year: Never true  . Ran Out of Food in the Last Year: Never true  Transportation Needs: No Transportation Needs  . Lack of Transportation (Medical): No  . Lack of Transportation (Non-Medical): No  Physical Activity: Insufficiently Active  . Days of Exercise per Week: 2 days  . Minutes of Exercise per Session: 20 min  Stress: No Stress Concern Present  . Feeling of Stress : Not at all  Social Connections: Moderately Integrated  . Frequency of Communication with Friends and Family: More than three times a week  . Frequency of Social Gatherings with Friends and Family: Three times a week  . Attends Religious Services: 1 to 4 times per year  . Active Member of Clubs or Organizations: No  . Attends Banker Meetings: Never  . Marital Status: Married  Catering manager Violence: Not At Risk  . Fear of Current or Ex-Partner: No  . Emotionally Abused: No  . Physically Abused: No  . Sexually Abused: No    Outpatient Medications Prior to Visit  Medication Sig Dispense Refill  . albuterol (VENTOLIN HFA) 108 (90 Base) MCG/ACT inhaler Inhale 2 puffs into the lungs every 6 (six) hours as needed for wheezing or shortness of breath. 8 g 3  . buPROPion (WELLBUTRIN XL) 300 MG 24 hr tablet TAKE 1 TABLET BY MOUTH DAILY 30 tablet 11  . carvedilol (COREG) 12.5 MG tablet TAKE ONE TABLET TWICE DAILY WITH A MEAL 180 tablet 3  . Cholecalciferol (VITAMIN D-3) 25 MCG (1000 UT) CAPS Take 1,000 Units by mouth daily.     . cyclobenzaprine (FLEXERIL) 5 MG tablet Take 1 tablet by mouth daily.    . diazepam (VALIUM) 5 MG tablet Take 1 tablet (5 mg total) by mouth daily as needed for anxiety. 30 tablet 2  . ENTRESTO 49-51 MG TAKE 1 TABLET BY MOUTH TWICE DAILY 180 tablet 3  . Fluticasone-Salmeterol (ADVAIR) 100-50 MCG/DOSE AEPB Inhale 1 puff into the lungs 2 (two) times daily. 1 each 3  . furosemide (LASIX) 20 MG tablet Take 1 tablet (20 mg total) by mouth daily as needed for edema. 90 tablet 3  .  Galcanezumab-gnlm 120 MG/ML SOAJ Inject 120 mg into the skin every 30 (thirty) days.    . hydrocortisone (ANUSOL-HC) 2.5 % rectal cream Place 1 application rectally 2 (two) times daily. 30 g 1  . hydroxychloroquine (PLAQUENIL) 200 MG tablet Take 2 tablets (400 mg total) by mouth every morning. 90 tablet 2  .  hydrOXYzine (ATARAX/VISTARIL) 25 MG tablet Take 1 tablet (25 mg total) by mouth at bedtime as needed for itching (insomnia). 180 tablet 1  . JARDIANCE 10 MG TABS tablet TAKE ONE TABLET EACH MORNING BEFORE BREAKFAST 90 tablet 3  . levothyroxine (SYNTHROID) 100 MCG tablet TAKE ONE TABLET EVERY DAY 90 tablet 3  . meloxicam (MOBIC) 7.5 MG tablet TAKE ONE TABLET BY MOUTH EVERY DAY 90 tablet 0  . nystatin-triamcinolone ointment (MYCOLOG) Apply 1 application topically 2 (two) times daily. 30 g 5  . omeprazole (PRILOSEC) 40 MG capsule TAKE 1 CAPSULE EVERY DAY 90 capsule 3  . OVER THE COUNTER MEDICATION Take 5 capsules by mouth daily. Ariix Optimals Vitamin & Minerals    . OVER THE COUNTER MEDICATION Take 1 scoop by mouth daily. Ariix Magnecal D    . oxyCODONE-acetaminophen (PERCOCET) 10-325 MG tablet Take 1 tablet by mouth 2 (two) times daily as needed for pain. 30 tablet 0  . rizatriptan (MAXALT) 10 MG tablet Take 10 mg by mouth as needed for migraine. May repeat in 2 hours if needed    . spironolactone (ALDACTONE) 25 MG tablet TAKE ONE TABLET EVERY DAY 90 tablet 3  . topiramate (TOPAMAX) 25 MG tablet Take 1 tablet (25 mg total) by mouth 2 (two) times daily. 60 tablet 3  . Wheat Dextrin (BENEFIBER) POWD Take 2 scoop by mouth daily as needed (for constipation).      No facility-administered medications prior to visit.    Allergies  Allergen Reactions  . Gabapentin (Once-Daily)     Depression   . Saxenda [Liraglutide -Weight Management] Other (See Comments)    Depression   . Sulfa Antibiotics     itching  . Trintellix [Vortioxetine] Other (See Comments)    GI issues   . Adhesive [Tape] Rash   . Codeine Itching    Review of Systems     Objective:    Physical Exam  BP 101/81   Pulse 79   Temp (!) 96.6 F (35.9 C) (Oral)   Ht 5\' 3"  (1.6 m)   Wt 232 lb (105.2 kg)   SpO2 97%   BMI 41.10 kg/m  Wt Readings from Last 3 Encounters:  08/24/20 232 lb (105.2 kg)  07/31/20 234 lb 9.6 oz (106.4 kg)  07/03/20 235 lb 6.4 oz (106.8 kg)    Health Maintenance Due  Topic Date Due  . COVID-19 Vaccine (1) Never done  . MAMMOGRAM  05/04/2020    There are no preventive care reminders to display for this patient.   Lab Results  Component Value Date   TSH 2.49 07/31/2020   Lab Results  Component Value Date   WBC 4.6 07/31/2020   HGB 13.2 07/31/2020   HCT 39.3 07/31/2020   MCV 94.7 07/31/2020   PLT 202.0 07/31/2020   Lab Results  Component Value Date   NA 143 07/31/2020   K 3.5 07/31/2020   CO2 25 07/31/2020   GLUCOSE 100 (H) 07/31/2020   BUN 15 07/31/2020   CREATININE 1.02 07/31/2020   BILITOT 0.5 07/31/2020   ALKPHOS 80 07/31/2020   AST 20 07/31/2020   ALT 22 07/31/2020   PROT 5.8 (L) 07/31/2020   ALBUMIN 3.8 07/31/2020   CALCIUM 8.9 07/31/2020   ANIONGAP 6 01/15/2017   GFR 59.83 (L) 07/31/2020   Lab Results  Component Value Date   CHOL 155 01/31/2020   Lab Results  Component Value Date   HDL 46 (L) 01/31/2020   Lab Results  Component Value  Date   LDLCALC 81 01/31/2020   Lab Results  Component Value Date   TRIG 181 (H) 01/31/2020   Lab Results  Component Value Date   CHOLHDL 3.4 01/31/2020   Lab Results  Component Value Date   HGBA1C 4.8 06/21/2019       Assessment & Plan:   Problem List Items Addressed This Visit   None   Visit Diagnoses    Cough    -  Primary   Viral upper respiratory infection       Acute bronchitis, viral           Meds ordered this encounter  Medications  . predniSONE (STERAPRED UNI-PAK 21 TAB) 10 MG (21) TBPK tablet    Sig: As directed    Dispense:  21 tablet    Refill:  0  . benzonatate  (TESSALON) 200 MG capsule    Sig: Take 1 capsule (200 mg total) by mouth 2 (two) times daily as needed for cough.    Dispense:  15 capsule    Refill:  0   Call the office with questions or concerns. REcheck as needed  Eulis Foster, FNP

## 2020-09-11 ENCOUNTER — Other Ambulatory Visit: Payer: Self-pay

## 2020-09-11 ENCOUNTER — Encounter (INDEPENDENT_AMBULATORY_CARE_PROVIDER_SITE_OTHER): Payer: Self-pay | Admitting: Family Medicine

## 2020-09-11 ENCOUNTER — Ambulatory Visit (INDEPENDENT_AMBULATORY_CARE_PROVIDER_SITE_OTHER): Payer: Medicare Other | Admitting: Family Medicine

## 2020-09-11 VITALS — BP 106/71 | HR 88 | Temp 97.8°F | Ht 63.0 in | Wt 228.0 lb

## 2020-09-11 DIAGNOSIS — M797 Fibromyalgia: Secondary | ICD-10-CM | POA: Diagnosis not present

## 2020-09-11 DIAGNOSIS — F418 Other specified anxiety disorders: Secondary | ICD-10-CM

## 2020-09-11 DIAGNOSIS — G720 Drug-induced myopathy: Secondary | ICD-10-CM | POA: Diagnosis not present

## 2020-09-11 DIAGNOSIS — U099 Post covid-19 condition, unspecified: Secondary | ICD-10-CM | POA: Diagnosis not present

## 2020-09-11 DIAGNOSIS — E039 Hypothyroidism, unspecified: Secondary | ICD-10-CM | POA: Diagnosis not present

## 2020-09-11 DIAGNOSIS — Z6841 Body Mass Index (BMI) 40.0 and over, adult: Secondary | ICD-10-CM

## 2020-09-15 ENCOUNTER — Encounter: Payer: Self-pay | Admitting: Pharmacist

## 2020-09-15 DIAGNOSIS — Z789 Other specified health status: Secondary | ICD-10-CM

## 2020-09-15 NOTE — Progress Notes (Signed)
[facility])                                            [facility] Quality Pharmacy Team                                        Statin Quality Measure Assessment    09/15/2020  Kristine Curtis 19-Jun-1960 [id number]  Per review of chart and payor information, patient has a diagnosis of diabetes but is not currently filling a statin prescription.  This places patient into the SUPD (Statin Use In Patients with Diabetes) measure for CMS.    Patient has documented statin intolerance due to muscle aches. A statin exclusion code was associated with one of Dr. Modesta Messing note last year which removed the patient from the measure.  The 10-year ASCVD risk score Kristine Curtis DC Montez Hageman., et al., 2013) is: 2.8%   Values used to calculate the score:     Age: 60 years     Sex: Female     Is Non-Hispanic African American: No     Diabetic: No     Tobacco smoker: No     Systolic Blood Pressure: 106 mmHg     Is BP treated: Yes     HDL Cholesterol: 46 mg/dL     Total Cholesterol: 155 mg/dL 2/75/1700     Component Value Date/Time   CHOL 155 01/31/2020 0906   TRIG 181 (H) 01/31/2020 0906   HDL 46 (L) 01/31/2020 0906   CHOLHDL 3.4 01/31/2020 0906   VLDL 26.4 02/03/2019 1429   LDLCALC 81 01/31/2020 0906   LDLDIRECT 75.0 03/20/2016 0836    Please consider ONE of the following recommendations:  ? Initiate high intensity statin Atorvastatin 40mg  once daily, #90, 3 refills   Rosuvastatin 20mg  once daily, #90, 3 refills    ? Initiate moderate intensity          statin with reduced frequency if prior          statin intolerance 1x weekly, #13, 3 refills   2x weekly, #26, 3 refills   3x weekly, #39, 3 refills    ? Code for past statin intolerance or  other exclusions (required annually)  Provider Requirements: ? Associate code during an office visit or telehealth encounter  Drug Induced Myopathy G72.0   Myopathy, unspecified G72.9   Myositis, unspecified M60.9    Rhabdomyolysis M62.82   Alcoholic fatty liver K70.0   Cirrhosis of liver K74.69   Prediabetes R73.03   PCOS E28.2   Toxic liver disease, unspecified K71.9   Adverse effect of antihyperlipidemic and antiarteriosclerotic drugs, initial encounter T46.6X5A    Plan:  Will route note to PCP  , PharmD, [facility] Clinical Pharmacist [phone]

## 2020-09-18 NOTE — Progress Notes (Signed)
Chief Complaint:   OBESITY Kristine Curtis is here to discuss her progress with her obesity treatment plan along with follow-up of her obesity related diagnoses.   Today's visit was #: 13 Starting weight: 226 lbs Starting date: 06/21/2019 Today's weight: 228 lbs Today's date: 09/11/2020 Weight change since last visit: 9 lb Total lbs lost to date: +2 lbs Body mass index is 40.39 kg/m.   Interim History:  Kristine Curtis had COVID in December and has long haul symptoms.  She still has no sense of smell.  Likes Pedialyte ice pops.  Her brother and father-in-law have died since our last visit.   She is being followed by Rheumatology at [facility].  Current Meal Plan: practicing portion control and making smarter food choices, such as increasing vegetables and decreasing simple carbohydrates for 0% of the time.  Current Exercise Plan: None.  Assessment/Plan:   1. Acquired hypothyroidism Course: Controlled. Medication: Synthroid 100 mcg daily.   Plan: Patient was instructed not to take MVM or iron within 4 hours of taking thyroid medications. This issue is managed by Dr. Mardelle Matte. We will continue to monitor alongside Endocrinology/PCP as it relates to her weight loss journey.   Lab Results  Component Value Date   TSH 2.49 07/31/2020   2. Fibromyalgia Stable, but not optimized. [name] always improves with regular exercise. We reviewed the importance of hydration, regular exercise, and restorative sleep.   3. COVID-19 long hauler We will continue to monitor symptoms as they relate to her weight loss journey.  4. Drug-induced myopathy, statin Updated for chart.  5. Depression with anxiety Kristine Curtis is taking Wellbutrin XL 300 mg daily.  Plan:  Behavior modification techniques were discussed today to help Kristine Curtis deal with her emotional/non-hunger eating behaviors.    6. Obesity, current BMI 40.5  Course: Kristine Curtis is currently in the action stage of change. As such, her goal is to continue with weight  loss efforts.   Nutrition goals: She has agreed to practicing portion control and making smarter food choices, such as increasing vegetables and decreasing simple carbohydrates.   Exercise goals: Increase activity, yoga.  Behavioral modification strategies: increasing lean protein intake, decreasing simple carbohydrates, increasing vegetables, increasing water intake and decreasing liquid calories.  Kristine Curtis has agreed to follow-up with our clinic in 3 weeks. She was informed of the importance of frequent follow-up visits to maximize her success with intensive lifestyle modifications for her multiple health conditions.   Objective:   Blood pressure 106/71, pulse 88, temperature 97.8 F (36.6 C), temperature source Oral, height 5\' 3"  (1.6 m), weight 228 lb (103.4 kg), SpO2 100 %. Body mass index is 40.39 kg/m.  General: Cooperative, alert, well developed, in no acute distress. HEENT: Conjunctivae and lids unremarkable. Cardiovascular: Regular rhythm.  Lungs: Normal work of breathing. Neurologic: No focal deficits.   Lab Results  Component Value Date   CREATININE 1.02 07/31/2020   BUN 15 07/31/2020   NA 143 07/31/2020   K 3.5 07/31/2020   CL 110 07/31/2020   CO2 25 07/31/2020   Lab Results  Component Value Date   ALT 22 07/31/2020   AST 20 07/31/2020   ALKPHOS 80 07/31/2020   BILITOT 0.5 07/31/2020   Lab Results  Component Value Date   HGBA1C 4.8 06/21/2019   HGBA1C 4.8 02/03/2019   HGBA1C 4.9 03/20/2016   HGBA1C 5.2 11/21/2014   Lab Results  Component Value Date   INSULIN 31.4 (H) 06/21/2019   Lab Results  Component Value Date  TSH 2.49 07/31/2020   Lab Results  Component Value Date   CHOL 155 01/31/2020   HDL 46 (L) 01/31/2020   LDLCALC 81 01/31/2020   LDLDIRECT 75.0 03/20/2016   TRIG 181 (H) 01/31/2020   CHOLHDL 3.4 01/31/2020   Lab Results  Component Value Date   WBC 4.6 07/31/2020   HGB 13.2 07/31/2020   HCT 39.3 07/31/2020   MCV 94.7 07/31/2020    PLT 202.0 07/31/2020   Lab Results  Component Value Date   IRON 41 (L) 11/17/2008   FERRITIN 25.1 11/17/2008   Obesity Behavioral Intervention:   Approximately 15 minutes were spent on the discussion below.  ASK: We discussed the diagnosis of obesity with Kristine Curtis today and Kristine Curtis agreed to give [place] permission to discuss obesity behavioral modification therapy today.  ASSESS: Kristine Curtis has the diagnosis of obesity and her BMI today is 40.5. Kristine Curtis is in the action stage of change.   ADVISE: Kristine Curtis was educated on the multiple health risks of obesity as well as the benefit of weight loss to improve her health. She was advised of the need for long term treatment and the importance of lifestyle modifications to improve her current health and to decrease her risk of future health problems.  AGREE: Multiple dietary modification options and treatment options were discussed and Chosen agreed to follow the recommendations documented in the above note.  ARRANGE: Kristine Curtis was educated on the importance of frequent visits to treat obesity as outlined per CMS and USPSTF guidelines and agreed to schedule her next follow up appointment today.  Attestation Statements:   Reviewed by clinician on day of visit: allergies, medications, problem list, medical history, surgical history, family history, social history, and previous encounter notes.  I, Insurance claims handler, CMA, am acting as transcriptionist for Helane Rima, DO  I have reviewed the above documentation for accuracy and completeness, and I agree with the above. Helane Rima, DO

## 2020-09-20 ENCOUNTER — Other Ambulatory Visit: Payer: Self-pay | Admitting: Family Medicine

## 2020-09-20 DIAGNOSIS — G8929 Other chronic pain: Secondary | ICD-10-CM

## 2020-09-26 ENCOUNTER — Ambulatory Visit (INDEPENDENT_AMBULATORY_CARE_PROVIDER_SITE_OTHER): Payer: Medicare Other | Admitting: Family Medicine

## 2020-09-27 ENCOUNTER — Encounter (INDEPENDENT_AMBULATORY_CARE_PROVIDER_SITE_OTHER): Payer: Self-pay

## 2020-10-03 ENCOUNTER — Ambulatory Visit (INDEPENDENT_AMBULATORY_CARE_PROVIDER_SITE_OTHER): Payer: Medicare Other | Admitting: Family Medicine

## 2020-10-16 ENCOUNTER — Other Ambulatory Visit: Payer: Self-pay

## 2020-10-16 ENCOUNTER — Ambulatory Visit (INDEPENDENT_AMBULATORY_CARE_PROVIDER_SITE_OTHER): Payer: Medicare Other | Admitting: Family Medicine

## 2020-10-16 ENCOUNTER — Encounter (INDEPENDENT_AMBULATORY_CARE_PROVIDER_SITE_OTHER): Payer: Self-pay | Admitting: Family Medicine

## 2020-10-16 VITALS — BP 114/77 | HR 89 | Temp 97.9°F | Ht 63.0 in | Wt 227.0 lb

## 2020-10-16 DIAGNOSIS — M797 Fibromyalgia: Secondary | ICD-10-CM | POA: Diagnosis not present

## 2020-10-16 DIAGNOSIS — Z6841 Body Mass Index (BMI) 40.0 and over, adult: Secondary | ICD-10-CM | POA: Diagnosis not present

## 2020-10-16 DIAGNOSIS — I5022 Chronic systolic (congestive) heart failure: Secondary | ICD-10-CM | POA: Diagnosis not present

## 2020-10-16 DIAGNOSIS — R7301 Impaired fasting glucose: Secondary | ICD-10-CM

## 2020-10-17 MED ORDER — OZEMPIC (0.25 OR 0.5 MG/DOSE) 2 MG/1.5ML ~~LOC~~ SOPN: 0.2500 mg | PEN_INJECTOR | SUBCUTANEOUS | 0 refills | Status: DC

## 2020-10-18 NOTE — Progress Notes (Signed)
Chief Complaint:   OBESITY Kristine Curtis is here to discuss her progress with her obesity treatment plan along with follow-up of her obesity related diagnoses.   Today's visit was #: 14 Starting weight: 226 lbs Starting date: 06/21/2019 Today's weight: 227 lbs Today's date: 10/16/2020 Weight change since last visit: -1 lbs Total lbs lost to date: +1 lbs Body mass index is 40.21 kg/m.   Interim History:Kristine Curtis reports that her hunger cues are normalizing, and that she has not been emotionally eating. She still has not had a return of her taste and smell since having COVID-19 in December.  Current Meal Plan: practicing portion control and making smarter food choices, such as increasing vegetables and decreasing simple carbohydrates for 100% of the time.  Current Exercise Plan: None at this time. Current Anti-Obesity Medications: None at this time.  Assessment/Plan:  1. Impaired fasting glucose We will coordinate with Dr. Swaziland since she is on SGLT2 drug, but she could benefit from starting on Ozempic as well. We will send a new prescription for this today.   - Start: Semaglutide,0.25 or 0.5MG /DOS, (OZEMPIC, 0.25 OR 0.5 MG/DOSE,) 2 MG/1.5ML SOPN; Inject 0.25 mg into the skin every 7 (seven) days.  Dispense: 1.5 mL; Refill: 0  2. Chronic systolic congestive heart failure (HCC), EF 35-40% Followed by Dr. Swaziland. Disease process and medications were reviewed with an emphasis on salt restriction, regular exercise, and regular weight monitoring. We will continue to monitor symptoms as they relate to her weight loss journey.  3. Fibromyalgia Stable, but not optimized. Kristine Curtis always improves with regular exercise. We reviewed the importance of hydration, regular exercise, and restorative sleep.   4. Obesity with current BMI of 40.2  Course: Kristine Curtis is currently in the action stage of change. As such, her goal is to continue with weight loss efforts.   Nutrition goals: She has agreed to  practicing portion control and making smarter food choices, such as increasing vegetables and decreasing simple carbohydrates.   Exercise goals: All adults should avoid inactivity. Some physical activity is better than none, and adults who participate in any amount of physical activity gain some health benefits.  Behavioral modification strategies: increasing lean protein intake, decreasing simple carbohydrates, increasing vegetables, and increasing water intake.  Kristine Curtis has agreed to follow-up with our clinic in 3 weeks. She was informed of the importance of frequent follow-up visits to maximize her success with intensive lifestyle modifications for her multiple health conditions.   Objective:   Blood pressure 114/77, pulse 89, temperature 97.9 F (36.6 C), height 5\' 3"  (1.6 m), weight 227 lb (103 kg), SpO2 98 %. Body mass index is 40.21 kg/m.  General: Cooperative, alert, well developed, in no acute distress. HEENT: Conjunctivae and lids unremarkable. Cardiovascular: Regular rhythm.  Lungs: Normal work of breathing. Neurologic: No focal deficits.   Lab Results  Component Value Date   CREATININE 1.02 07/31/2020   BUN 15 07/31/2020   NA 143 07/31/2020   K 3.5 07/31/2020   CL 110 07/31/2020   CO2 25 07/31/2020   Lab Results  Component Value Date   ALT 22 07/31/2020   AST 20 07/31/2020   ALKPHOS 80 07/31/2020   BILITOT 0.5 07/31/2020   Lab Results  Component Value Date   HGBA1C 4.8 06/21/2019   HGBA1C 4.8 02/03/2019   HGBA1C 4.9 03/20/2016   HGBA1C 5.2 11/21/2014   Lab Results  Component Value Date   INSULIN 31.4 (H) 06/21/2019   Lab Results  Component Value Date  TSH 2.49 07/31/2020   Lab Results  Component Value Date   CHOL 155 01/31/2020   HDL 46 (L) 01/31/2020   LDLCALC 81 01/31/2020   LDLDIRECT 75.0 03/20/2016   TRIG 181 (H) 01/31/2020   CHOLHDL 3.4 01/31/2020   Lab Results  Component Value Date   WBC 4.6 07/31/2020   HGB 13.2 07/31/2020   HCT 39.3  07/31/2020   MCV 94.7 07/31/2020   PLT 202.0 07/31/2020   Lab Results  Component Value Date   IRON 41 (L) 11/17/2008   FERRITIN 25.1 11/17/2008    Obesity Behavioral Intervention:   Approximately 15 minutes were spent on the discussion below.  ASK: We discussed the diagnosis of obesity with Kristine Curtis today and Kristine Curtis agreed to give [place] permission to discuss obesity behavioral modification therapy today.  ASSESS: Kristine Curtis has the diagnosis of obesity and her BMI today is 40.2. Kristine Curtis is in the action stage of change.   ADVISE: Kristine Curtis was educated on the multiple health risks of obesity as well as the benefit of weight loss to improve her health. She was advised of the need for long term treatment and the importance of lifestyle modifications to improve her current health and to decrease her risk of future health problems.  AGREE: Multiple dietary modification options and treatment options were discussed and Kristine Curtis agreed to follow the recommendations documented in the above note.  ARRANGE: Kristine Curtis was educated on the importance of frequent visits to treat obesity as outlined per CMS and USPSTF guidelines and agreed to schedule her next follow up appointment today.  Attestation Statements:   Reviewed by clinician on day of visit: allergies, medications, problem list, medical history, surgical history, family history, social history, and previous encounter notes.  I, Noah Mincy,LPN am acting as Energy manager for [employer], DO.  I have reviewed the above documentation for accuracy and completeness, and I agree with the above. Helane Rima, DO

## 2020-10-25 ENCOUNTER — Encounter (INDEPENDENT_AMBULATORY_CARE_PROVIDER_SITE_OTHER): Payer: Self-pay | Admitting: Family Medicine

## 2020-11-01 ENCOUNTER — Ambulatory Visit (INDEPENDENT_AMBULATORY_CARE_PROVIDER_SITE_OTHER): Payer: Medicare Other | Admitting: Family Medicine

## 2020-11-01 ENCOUNTER — Other Ambulatory Visit: Payer: Self-pay

## 2020-11-01 ENCOUNTER — Encounter (INDEPENDENT_AMBULATORY_CARE_PROVIDER_SITE_OTHER): Payer: Self-pay | Admitting: Family Medicine

## 2020-11-01 VITALS — BP 117/77 | HR 91 | Temp 98.0°F | Ht 63.0 in | Wt 224.0 lb

## 2020-11-01 DIAGNOSIS — Z6841 Body Mass Index (BMI) 40.0 and over, adult: Secondary | ICD-10-CM | POA: Diagnosis not present

## 2020-11-01 DIAGNOSIS — F3289 Other specified depressive episodes: Secondary | ICD-10-CM | POA: Diagnosis not present

## 2020-11-01 DIAGNOSIS — M7918 Myalgia, other site: Secondary | ICD-10-CM

## 2020-11-02 NOTE — Progress Notes (Signed)
Chief Complaint:   OBESITY Kristine Curtis is here to discuss her progress with her obesity treatment plan along with follow-up of her obesity related diagnoses. See Medical Weight Management Flowsheet for complete bioelectrical impedance results.  Today's visit was #: 15 Starting weight: 226 lbs Starting date: 06/21/2019 Today's weight: 224 lbs Today's date: 11/01/2020 Weight change since last visit: 3 lbs Total lbs lost to date: 2 lbs Body mass index is 39.68 kg/m.  Total weight loss percentage to date: -0.88%  Interim History: Kristine Curtis has not started her GLP-1 RA.  Her mood is good.  She says her marriage has greatly improved.  She is focused on self-care.  Nutrition Plan: practicing portion control and making smarter food choices, such as increasing vegetables and decreasing simple carbohydrates for 100% of the time. Activity:  Stretching for 30+ minutes 3-4 times per week. Anti-obesity medications: Ozempic 0.25 mg subcutaneously weekly. Reported side effects: None.  Assessment/Plan:   1. Myofascial pain dysfunction syndrome Stable, but not optimized. Kristine Curtis always improves with regular exercise. We reviewed the importance of hydration, regular exercise, and restorative sleep.   2. Other depression, with emotional eating  Kristine Curtis is taking Wellbutrin XL 300 mg daily.   Plan:  Behavior modification techniques were discussed today to help Kristine Curtis deal with her emotional/non-hunger eating behaviors.    3. Obesity with current BMI of 49.8  Course: Kristine Curtis is currently in the action stage of change. As such, her goal is to continue with weight loss efforts.   Nutrition goals: She has agreed to practicing portion control and making smarter food choices, such as increasing vegetables and decreasing simple carbohydrates.   Exercise goals:  As is.  Behavioral modification strategies: increasing lean protein intake, decreasing simple carbohydrates, increasing vegetables, and increasing water  intake.  Kristine Curtis has agreed to follow-up with our clinic in 3 weeks. She was informed of the importance of frequent follow-up visits to maximize her success with intensive lifestyle modifications for her multiple health conditions.   Objective:   Blood pressure 117/77, pulse 91, temperature 98 F (36.7 C), temperature source Oral, height 5\' 3"  (1.6 m), weight 224 lb (101.6 kg), SpO2 96 %. Body mass index is 39.68 kg/m.  General: Cooperative, alert, well developed, in no acute distress. HEENT: Conjunctivae and lids unremarkable. Cardiovascular: Regular rhythm.  Lungs: Normal work of breathing. Neurologic: No focal deficits.   Lab Results  Component Value Date   CREATININE 1.02 07/31/2020   BUN 15 07/31/2020   NA 143 07/31/2020   K 3.5 07/31/2020   CL 110 07/31/2020   CO2 25 07/31/2020   Lab Results  Component Value Date   ALT 22 07/31/2020   AST 20 07/31/2020   ALKPHOS 80 07/31/2020   BILITOT 0.5 07/31/2020   Lab Results  Component Value Date   HGBA1C 4.8 06/21/2019   HGBA1C 4.8 02/03/2019   HGBA1C 4.9 03/20/2016   HGBA1C 5.2 11/21/2014   Lab Results  Component Value Date   INSULIN 31.4 (H) 06/21/2019   Lab Results  Component Value Date   TSH 2.49 07/31/2020   Lab Results  Component Value Date   CHOL 155 01/31/2020   HDL 46 (L) 01/31/2020   LDLCALC 81 01/31/2020   LDLDIRECT 75.0 03/20/2016   TRIG 181 (H) 01/31/2020   CHOLHDL 3.4 01/31/2020   Lab Results  Component Value Date   VD25OH 49.7 06/21/2019   VD25OH 58.34 02/03/2019   VD25OH 45.39 03/20/2016   Lab Results  Component Value Date  WBC 4.6 07/31/2020   HGB 13.2 07/31/2020   HCT 39.3 07/31/2020   MCV 94.7 07/31/2020   PLT 202.0 07/31/2020   Lab Results  Component Value Date   IRON 41 (L) 11/17/2008   FERRITIN 25.1 11/17/2008   Obesity Behavioral Intervention:   Approximately 15 minutes were spent on the discussion below.  ASK: We discussed the diagnosis of obesity with Kristine Curtis today  and Kristine Curtis agreed to give [place] permission to discuss obesity behavioral modification therapy today.  ASSESS: Kristine Curtis has the diagnosis of obesity and her BMI today is 39.8. Kristine Curtis is in the action stage of change.   ADVISE: Kristine Curtis was educated on the multiple health risks of obesity as well as the benefit of weight loss to improve her health. She was advised of the need for long term treatment and the importance of lifestyle modifications to improve her current health and to decrease her risk of future health problems.  AGREE: Multiple dietary modification options and treatment options were discussed and Kristine Curtis agreed to follow the recommendations documented in the above note.  ARRANGE: Kristine Curtis was educated on the importance of frequent visits to treat obesity as outlined per CMS and USPSTF guidelines and agreed to schedule her next follow up appointment today.  Attestation Statements:   Reviewed by clinician on day of visit: allergies, medications, problem list, medical history, surgical history, family history, social history, and previous encounter notes.  I, Insurance claims handler, CMA, am acting as transcriptionist for Helane Rima, DO  I have reviewed the above documentation for accuracy and completeness, and I agree with the above. Helane Rima, DO

## 2020-11-13 ENCOUNTER — Encounter (INDEPENDENT_AMBULATORY_CARE_PROVIDER_SITE_OTHER): Payer: Self-pay | Admitting: Family Medicine

## 2020-11-16 ENCOUNTER — Ambulatory Visit (INDEPENDENT_AMBULATORY_CARE_PROVIDER_SITE_OTHER): Payer: Medicare Other | Admitting: Family Medicine

## 2020-11-19 ENCOUNTER — Other Ambulatory Visit (INDEPENDENT_AMBULATORY_CARE_PROVIDER_SITE_OTHER): Payer: Self-pay | Admitting: Family Medicine

## 2020-11-19 DIAGNOSIS — R7301 Impaired fasting glucose: Secondary | ICD-10-CM

## 2020-11-20 ENCOUNTER — Encounter (INDEPENDENT_AMBULATORY_CARE_PROVIDER_SITE_OTHER): Payer: Self-pay

## 2020-11-20 NOTE — Telephone Encounter (Signed)
Last OV with Dr Wallace 

## 2020-11-20 NOTE — Telephone Encounter (Signed)
Message sent to pt-CAS 

## 2020-11-30 ENCOUNTER — Other Ambulatory Visit: Payer: Self-pay

## 2020-11-30 ENCOUNTER — Ambulatory Visit (INDEPENDENT_AMBULATORY_CARE_PROVIDER_SITE_OTHER): Payer: Medicare Other | Admitting: Family Medicine

## 2020-11-30 ENCOUNTER — Encounter (INDEPENDENT_AMBULATORY_CARE_PROVIDER_SITE_OTHER): Payer: Self-pay | Admitting: Family Medicine

## 2020-11-30 VITALS — BP 113/77 | HR 85 | Temp 97.8°F | Ht 63.0 in | Wt 219.0 lb

## 2020-11-30 DIAGNOSIS — Z6841 Body Mass Index (BMI) 40.0 and over, adult: Secondary | ICD-10-CM | POA: Diagnosis not present

## 2020-11-30 DIAGNOSIS — G8929 Other chronic pain: Secondary | ICD-10-CM

## 2020-11-30 DIAGNOSIS — I5022 Chronic systolic (congestive) heart failure: Secondary | ICD-10-CM | POA: Diagnosis not present

## 2020-11-30 DIAGNOSIS — F3289 Other specified depressive episodes: Secondary | ICD-10-CM | POA: Diagnosis not present

## 2020-11-30 MED ORDER — NALTREXONE HCL 50 MG PO TABS: 25.0000 mg | ORAL_TABLET | Freq: Every day | ORAL | 0 refills | Status: DC

## 2020-12-02 NOTE — Progress Notes (Signed)
Chief Complaint:   OBESITY Kristine Curtis is here to discuss her progress with her obesity treatment plan along with follow-up of her obesity related diagnoses. See Medical Weight Management Flowsheet for complete bioelectrical impedance results.  Today's visit was #: 16 Starting weight: 226 lbs Starting date: 06/21/2019 Today's weight: 219 lbs Today's date: 11/30/2020 Weight change since last visit: 5 lbs Total lbs lost to date: 7 lbs Body mass index is 38.79 kg/m.  Total weight loss percentage to date: -3.10%  Interim History:  Kristine Curtis says she has been experiencing hair loss.  She has a chronic cough, probably related to [facility].  She will call her cardiologist today.  She has had orthostatic symptoms and has fallen twice.  She has nail spooning, noted recently by her new Rheumatologist.  I have reviewed her Rheumatology note.  Nutrition Plan: practicing portion control and making smarter food choices, such as increasing vegetables and decreasing simple carbohydrates for 90% of the time. Activity:  Walking for 30 minutes 4 times per week.  Assessment/Plan:   1. Other chronic pain Not optimized. Kristine Curtis has a great understanding of exercise, stress reduction, and whole foods-based diet to augment treatment for chronic pain. She is starting to get back to her regimen. Rheumatologist recommended low dose Naltrexone for chronic pain and fatigue. Kristine Curtis says that she was on this years ago and it helped. She was also successful with weight management at that time. Since Naltrexone is also used as an anti-obesity medication (Contrave), will trial as below.   Plan:  Start naltrexone 25 mg daily.  Will continue to monitor symptoms as they relate to her weight loss journey.  - Start naltrexone (DEPADE) 50 MG tablet; Take 0.5 tablets (25 mg total) by mouth daily.  Dispense: 15 tablet; Refill: 0  2. Chronic systolic congestive heart failure (HCC), EF 35-40% Stable to improved. On Jardiance per  Cardiology, so holding off on starting GLP1RA. Disease process and medications were reviewed with an emphasis on salt restriction, regular exercise, and regular weight monitoring. We will continue to monitor symptoms as they relate to her weight loss journey.  3. Other depression, with emotional eating  Kristine Curtis is taking Wellbutrin XL 300 mg daily.   Plan:  Behavior modification techniques were discussed today to help Kristine Curtis deal with her emotional/non-hunger eating behaviors.  4. Obesity with current BMI of 38.9  Course: Kristine Curtis is currently in the action stage of change. As such, her goal is to continue with weight loss efforts.   Nutrition goals: She has agreed to practicing portion control and making smarter food choices, such as increasing vegetables and decreasing simple carbohydrates.   Exercise goals:  As is.  Behavioral modification strategies: increasing lean protein intake, decreasing simple carbohydrates, increasing vegetables, and increasing water intake.  Kristine Curtis has agreed to follow-up with our clinic in 3 weeks. She was informed of the importance of frequent follow-up visits to maximize her success with intensive lifestyle modifications for her multiple health conditions.   Objective:   Blood pressure 113/77, pulse 85, temperature 97.8 F (36.6 C), height 5\' 3"  (1.6 m), weight 219 lb (99.3 kg), SpO2 100 %. Body mass index is 38.79 kg/m.  General: Cooperative, alert, well developed, in no acute distress. HEENT: Conjunctivae and lids unremarkable. Cardiovascular: Regular rhythm.  Lungs: Normal work of breathing. Neurologic: No focal deficits.   Lab Results  Component Value Date   CREATININE 1.02 07/31/2020   BUN 15 07/31/2020   NA 143 07/31/2020   K 3.5  07/31/2020   CL 110 07/31/2020   CO2 25 07/31/2020   Lab Results  Component Value Date   ALT 22 07/31/2020   AST 20 07/31/2020   ALKPHOS 80 07/31/2020   BILITOT 0.5 07/31/2020   Lab Results  Component Value  Date   HGBA1C 4.8 06/21/2019   HGBA1C 4.8 02/03/2019   HGBA1C 4.9 03/20/2016   HGBA1C 5.2 11/21/2014   Lab Results  Component Value Date   INSULIN 31.4 (H) 06/21/2019   Lab Results  Component Value Date   TSH 2.49 07/31/2020   Lab Results  Component Value Date   CHOL 155 01/31/2020   HDL 46 (L) 01/31/2020   LDLCALC 81 01/31/2020   LDLDIRECT 75.0 03/20/2016   TRIG 181 (H) 01/31/2020   CHOLHDL 3.4 01/31/2020   Lab Results  Component Value Date   VD25OH 49.7 06/21/2019   VD25OH 58.34 02/03/2019   VD25OH 45.39 03/20/2016   Lab Results  Component Value Date   WBC 4.6 07/31/2020   HGB 13.2 07/31/2020   HCT 39.3 07/31/2020   MCV 94.7 07/31/2020   PLT 202.0 07/31/2020   Lab Results  Component Value Date   IRON 41 (L) 11/17/2008   FERRITIN 25.1 11/17/2008   Obesity Behavioral Intervention:   Approximately 15 minutes were spent on the discussion below.  ASK: We discussed the diagnosis of obesity with Kristine Curtis today and Kristine Curtis agreed to give [place] permission to discuss obesity behavioral modification therapy today.  ASSESS: Kristine Curtis has the diagnosis of obesity and her BMI today is 38.9. Kristine Curtis is in the action stage of change.   ADVISE: Kristine Curtis was educated on the multiple health risks of obesity as well as the benefit of weight loss to improve her health. She was advised of the need for long term treatment and the importance of lifestyle modifications to improve her current health and to decrease her risk of future health problems.  AGREE: Multiple dietary modification options and treatment options were discussed and Kristine Curtis agreed to follow the recommendations documented in the above note.  ARRANGE: Kristine Curtis was educated on the importance of frequent visits to treat obesity as outlined per CMS and USPSTF guidelines and agreed to schedule her next follow up appointment today.  Attestation Statements:   Reviewed by clinician on day of visit: allergies, medications, problem  list, medical history, surgical history, family history, social history, and previous encounter notes.  I, Insurance claims handler, CMA, am acting as transcriptionist for Helane Rima, DO  I have reviewed the above documentation for accuracy and completeness, and I agree with the above. Helane Rima, DO

## 2020-12-11 ENCOUNTER — Ambulatory Visit: Payer: Medicare Other

## 2020-12-14 ENCOUNTER — Other Ambulatory Visit: Payer: Self-pay | Admitting: Family Medicine

## 2020-12-14 ENCOUNTER — Other Ambulatory Visit: Payer: Self-pay

## 2020-12-14 ENCOUNTER — Ambulatory Visit: Payer: Medicare Other

## 2020-12-14 ENCOUNTER — Ambulatory Visit (INDEPENDENT_AMBULATORY_CARE_PROVIDER_SITE_OTHER): Payer: Medicare Other | Admitting: Pharmacist Clinician (PhC)/ Clinical Pharmacy Specialist

## 2020-12-14 DIAGNOSIS — I10 Essential (primary) hypertension: Secondary | ICD-10-CM

## 2020-12-14 DIAGNOSIS — G8929 Other chronic pain: Secondary | ICD-10-CM

## 2020-12-14 MED ORDER — LOSARTAN POTASSIUM 50 MG PO TABS: 50.0000 mg | ORAL_TABLET | Freq: Every day | ORAL | 3 refills | Status: DC

## 2020-12-14 NOTE — Patient Instructions (Signed)
Return for a a follow up appointment September 8 at 2 pm  Check your blood pressure at home daily and keep record of the readings.  If you notice that the readings are going to > 140 please give [place] a call, we may need to adjust the losartan dose.   Take your BP meds as follows:  Stop Entresto.   Start losartan 50 mg once daily.  Take this for 1 month.      Bring all of your meds, your BP cuff and your record of home blood pressures to your next appointment.  Exercise as you're able, try to walk approximately 30 minutes per day.  Keep salt intake to a minimum, especially watch canned and prepared boxed foods.  Eat more fresh fruits and vegetables and fewer canned items.  Avoid eating in fast food restaurants.    HOW TO TAKE YOUR BLOOD PRESSURE: Rest 5 minutes before taking your blood pressure.  Don't smoke or drink caffeinated beverages for at least 30 minutes before. Take your blood pressure before (not after) you eat. Sit comfortably with your back supported and both feet on the floor (don't cross your legs). Elevate your arm to heart level on a table or a desk. Use the proper sized cuff. It should fit smoothly and snugly around your bare upper arm. There should be enough room to slip a fingertip under the cuff. The bottom edge of the cuff should be 1 inch above the crease of the elbow. Ideally, take 3 measurements at one sitting and record the average.

## 2020-12-14 NOTE — Progress Notes (Signed)
HPI:  Kristine Curtis is a 60 y.o. female patient of Dr [place], with a PMH below who presents today for heart failure medication titration. EF was at 35-40% on previous cardiac cath, but most recent limited echo showed an increase to 40-45%. We increased Entresto dose to 49/51mg  twice daily . She is also on carvedilol 12.5 mg twice daily, sprionolactone 25 mg daily, and furosemide 20 mg daily. She is recovering from COVID and still experiencing some cough. Otherwise, doing well and compliance with all therapy.  She has been having some long-haul covid issues. A chest [facility] at [facility] noted no cause of cough, but did find some nodules that will be monitored.     Last seen 6 months ago by Dr. [place].  Today she complains of ongoing cough and severe hair loss.  She notes she has always had some hair loss when shampooing her hair, but has been more in the past several months.  Her only other concern is 2 recent episodes of losing her balance when bending over.  She stumbled and managed to catch herself before falling face first.  She is now working with [facility], states trying to listen to her body more and turn off emotional eating.     Seated 118/74 Standing 92/74 Home readgins < 120/80 almost completely  Past Medical History: hypertension Controlled on entresto and carvedilol  hypothyroidism levothyroixine 100 mcg  Chronic back pain On long term percocet/cyclobenzaprine prn     Blood Pressure Goal:  130/80  Current Medications:  Entresto 49/51 mg twice daily Carvedilol 12.5 mg twice daily Spironolactone 25 mg daily Furosemide 20mg  daily Jardiance 10mg  daily  Family Hx: father has AF, hypertension (started in late 09/05/22); now 37; mother died at 53 from pancreatic cancer; one brother died last month at 101 - kidney failure/hypertension; another sister, brother, no specific issues; 2 children, daughter with gestational hypertension/preeclampsia; son with hypertension,  recovering alcoholic, (his BP better now since he is 6 years sober)  Social Hx: no, no, is addicted to coke/pepsi - can drind as much as 24 cans over 3-4 days, but states may then go several days without  Diet: tries to eat more at home, but does eat out some, no added salt; tries to eat more grilled than fried (uses coconut oil spray).  Next 56 days eating plan; working with [facility] as well (has gained 9 pounds since starting in February).   Exercise: yoga 3 days per week - started recently  Home BP readings: no readings with her today, but notes most home readings are < 120/80  Intolerances: liraglutide caused depression  Labs:  9/21:  Na 142, K 3.8, Glu 99, BUN 9, SCr 0.9, GFR 70  Wt Readings from Last 3 Encounters:  12/18/20 218 lb (98.9 kg)  12/14/20 225 lb (102.1 kg)  11/30/20 219 lb (99.3 kg)   BP Readings from Last 3 Encounters:  12/18/20 115/75  12/14/20 110/78  11/30/20 113/77   Pulse Readings from Last 3 Encounters:  12/18/20 78  12/14/20 95  11/30/20 85    Current Outpatient Medications  Medication Sig Dispense Refill   albuterol (VENTOLIN HFA) 108 (90 Base) MCG/ACT inhaler Inhale 2 puffs into the lungs every 6 (six) hours as needed for wheezing or shortness of breath. 8 g 3   buPROPion (WELLBUTRIN XL) 300 MG 24 hr tablet TAKE 1 TABLET BY MOUTH DAILY 30 tablet 11   carvedilol (COREG) 12.5 MG tablet  TAKE ONE TABLET TWICE DAILY WITH A MEAL 180 tablet 3   Cholecalciferol (VITAMIN D-3) 25 MCG (1000 UT) CAPS Take 1,000 Units by mouth daily.      diazepam (VALIUM) 5 MG tablet Take 1 tablet (5 mg total) by mouth daily as needed for anxiety. 30 tablet 2   ENTRESTO 49-51 MG TAKE 1 TABLET BY MOUTH TWICE DAILY 180 tablet 3   Fluticasone-Salmeterol (ADVAIR) 100-50 MCG/DOSE AEPB Inhale 1 puff into the lungs 2 (two) times daily. 1 each 3   furosemide (LASIX) 20 MG tablet Take 1 tablet (20 mg total) by mouth daily as needed for edema. 90 tablet 3    Galcanezumab-gnlm 120 MG/ML SOAJ Inject 120 mg into the skin every 30 (thirty) days.     hydroxychloroquine (PLAQUENIL) 200 MG tablet Take 2 tablets (400 mg total) by mouth every morning. 90 tablet 2   hydrOXYzine (ATARAX/VISTARIL) 25 MG tablet Take 1 tablet (25 mg total) by mouth at bedtime as needed for itching (insomnia). 180 tablet 1   JARDIANCE 10 MG TABS tablet TAKE ONE TABLET EACH MORNING BEFORE BREAKFAST 90 tablet 3   levothyroxine (SYNTHROID) 100 MCG tablet TAKE ONE TABLET EVERY DAY 90 tablet 3   losartan (COZAAR) 50 MG tablet Take 1 tablet (50 mg total) by mouth daily. 30 tablet 3   nystatin-triamcinolone ointment (MYCOLOG) Apply 1 application topically 2 (two) times daily. 30 g 5   omeprazole (PRILOSEC) 40 MG capsule TAKE 1 CAPSULE EVERY DAY 90 capsule 3   OVER THE COUNTER MEDICATION Take 5 capsules by mouth daily. [employer]ix Optimals Vitamin & Minerals     OVER THE COUNTER MEDICATION Take 1 scoop by mouth daily. Ariix Magnecal D     rizatriptan (MAXALT) 10 MG tablet Take 10 mg by mouth as needed for migraine. May repeat in 2 hours if needed     spironolactone (ALDACTONE) 25 MG tablet TAKE ONE TABLET EVERY DAY 90 tablet 3   topiramate (TOPAMAX) 25 MG tablet Take 1 tablet (25 mg total) by mouth 2 (two) times daily. 60 tablet 3   Wheat Dextrin (BENEFIBER) POWD Take 2 scoop by mouth daily as needed (for constipation).      meloxicam (MOBIC) 7.5 MG tablet TAKE ONE TABLET BY MOUTH EVERY DAY 90 tablet 0   naltrexone (DEPADE) 50 MG tablet Take 0.5 tablets (25 mg total) by mouth daily. 45 tablet 0   No current facility-administered medications for this visit.    Allergies  Allergen Reactions   Gabapentin (Once-Daily)     Depression    Saxenda [Liraglutide -Weight Management] Other (See Comments)    Depression    Sulfa Antibiotics     itching   Trintellix [Vortioxetine] Other (See Comments)    GI issues    Adhesive [Tape] Rash   Codeine Itching    Past Medical History:  Diagnosis Date    Allergic rhinitis due to pollen    Anxiety    Asthma    Carpal tunnel syndrome of right wrist 12/22/2017   Chronic combined systolic and diastolic CHF (congestive heart failure) (HCC)    06/2016 Echo: EF 40-45%, Gr1 DD   Chronic fatigue    Depression    Family history of polycystic kidney with negative CT 11/16/2012   Fibromyalgia    GERD (gastroesophageal reflux disease)    Hypertension    Hypothyroidism    IBS (irritable bowel syndrome)    Internal hemorrhoids    Lymphocytic colitis    Morbid obesity (HCC)  NICM (nonischemic cardiomyopathy) (HCC)    a. 1999 Cath: nl cors;  b. EF prev as low as 35%;  c. 11/2015 Echo: Ef 40-45%;  d. 06/2016 Echo: EF 40-45%;  e. Lexiscan MV: fixed anterosepta/inferseptal defect w/o ischemia, EF 35%.   Osteoarthritis    PONV (postoperative nausea and vomiting)    PVC (premature ventricular contraction)    Restless leg syndrome    Sjogren's syndrome (HCC)    Vertigo     Blood pressure 110/78, pulse 95, resp. rate 14, height 5\' 3"  (1.6 m), weight 225 lb (102.1 kg), SpO2 98 %.  Essential hypertension Unsure if is the cause of her cough, as it can occur in up to 9% of patients.  Could also be related to long haul covid.  For now will stop Entresto and have her resume losartan 50 mg daily.  Will do this for one month to see if cough resolves.  Also noted today that when doing orthostatic readings, her pressure dropped > 20 points from sitting to standing.  Advised that she use caution with positional changes.  Will need to continue monitoring this to determine if we need to cut back on BP medications or consider midodrine should the problem worsen.  Will have her return in 4 weeks for follow up.    [name] PharmD CPP [facility] [address] [place] 12/18/2020 5:15 PM

## 2020-12-15 ENCOUNTER — Other Ambulatory Visit (INDEPENDENT_AMBULATORY_CARE_PROVIDER_SITE_OTHER): Payer: Self-pay | Admitting: Family Medicine

## 2020-12-15 DIAGNOSIS — G8929 Other chronic pain: Secondary | ICD-10-CM

## 2020-12-18 ENCOUNTER — Encounter: Payer: Self-pay | Admitting: Pharmacist Clinician (PhC)/ Clinical Pharmacy Specialist

## 2020-12-18 ENCOUNTER — Ambulatory Visit (INDEPENDENT_AMBULATORY_CARE_PROVIDER_SITE_OTHER): Payer: Medicare Other | Admitting: Family Medicine

## 2020-12-18 ENCOUNTER — Encounter (INDEPENDENT_AMBULATORY_CARE_PROVIDER_SITE_OTHER): Payer: Self-pay | Admitting: Family Medicine

## 2020-12-18 ENCOUNTER — Other Ambulatory Visit: Payer: Self-pay

## 2020-12-18 VITALS — BP 115/75 | HR 78 | Temp 98.1°F | Ht 63.0 in | Wt 218.0 lb

## 2020-12-18 DIAGNOSIS — I5022 Chronic systolic (congestive) heart failure: Secondary | ICD-10-CM | POA: Diagnosis not present

## 2020-12-18 DIAGNOSIS — F418 Other specified anxiety disorders: Secondary | ICD-10-CM | POA: Diagnosis not present

## 2020-12-18 DIAGNOSIS — Z6841 Body Mass Index (BMI) 40.0 and over, adult: Secondary | ICD-10-CM | POA: Diagnosis not present

## 2020-12-18 DIAGNOSIS — G8929 Other chronic pain: Secondary | ICD-10-CM | POA: Diagnosis not present

## 2020-12-18 MED ORDER — NALTREXONE HCL 50 MG PO TABS: 25.0000 mg | ORAL_TABLET | Freq: Every day | ORAL | 0 refills | Status: DC

## 2020-12-18 NOTE — Telephone Encounter (Signed)
Dr.Wallace °

## 2020-12-18 NOTE — Assessment & Plan Note (Addendum)
Unsure if Kristine Curtis is the cause of her cough, as it can occur in up to 9% of patients.  Could also be related to long haul covid.  For now will stop Entresto and have her resume losartan 50 mg daily.  Will do this for one month to see if cough resolves.  Also noted today that when doing orthostatic readings, her pressure dropped > 20 points from sitting to standing.  Advised that she use caution with positional changes.  Will need to continue monitoring this to determine if we need to cut back on BP medications or consider midodrine should the problem worsen.  Will have her return in 4 weeks for follow up.

## 2020-12-19 ENCOUNTER — Ambulatory Visit: Payer: Medicare Other

## 2020-12-20 NOTE — Progress Notes (Signed)
Chief Complaint:   OBESITY Tonea is here to discuss her progress with her obesity treatment plan along with follow-up of her obesity related diagnoses.   Today's visit was #: 17 Starting weight: 226 lbs Starting date: 06/21/2019 Today's weight: 218 lbs Today's date: 12/18/2020 Weight change since last visit: -1 lb Total lbs lost to date: 8 lbs Body mass index is 38.62 kg/m.  Total weight loss percentage to date: -3.54%  Current Meal Plan: practicing portion control and making smarter food choices, such as increasing vegetables and decreasing simple carbohydrates for 100% of the time.  Current Exercise Plan: Walking and stretching for 30 minutes 3-4 times per week.  Interim History: Lynnann reports that she is off Flexeril and will be stopping Mobic next. She is taking Naltrexone daily and it is helping regulate sleep, increased energy. It is not interfering with Valium. She is still needed to take 10 mg of Oxycodone. She reports a "burning nerve pain worsening from her torso to her face". She is also still staying true to intuitive eating.  She reports the follow blood pressure readings: 87/71 pulse 77, 99/85 pulse 78-feels lethargic and dizzy at those times.  Assessment/Plan:   1. Other chronic pain Not optimized. [name] has a great understanding of exercise, stress reduction, and whole foods-based diet to augment treatment for chronic pain. She is starting to get back to her regimen. Rheumatologist recommended low dose Naltrexone for chronic pain and fatigue. [name] says that she was on this years ago and it helped. She was also successful with weight management at that time.    Plan:  Refill  naltrexone 25 mg daily.  Will continue to monitor symptoms as they relate to her weight loss journey.  - Refill naltrexone (DEPADE) 50 MG tablet; Take 0.5 tablets (25 mg total) by mouth daily.  Dispense: 45 tablet; Refill: 0  2. Chronic systolic congestive heart failure (HCC), EF  35-40% Stable to improved. On Jardiance per Cardiology, so holding off on starting GLP1RA. Disease process and medications were reviewed with an emphasis on salt restriction, regular exercise, and regular weight monitoring. We will continue to monitor symptoms as they relate to her weight loss journey.  3. Depression with anxiety, emotional eating tendencies Rosamaria is taking Wellbutrin XL 300 mg daily.   Plan:  Behavior modification techniques were discussed today to help Alexzandra deal with her emotional/non-hunger eating behaviors.  4. Obesity with current BMI of 38.6 Course: Selia is currently in the action stage of change. As such, her goal is to continue with weight loss efforts.   Nutrition goals: She has agreed to practicing portion control and making smarter food choices, such as increasing vegetables and decreasing simple carbohydrates.   Exercise goals: As is.  Behavioral modification strategies: increasing lean protein intake, decreasing simple carbohydrates, increasing vegetables, increasing water intake, and emotional eating strategies.  Paidyn has agreed to follow-up with our clinic in 4 weeks. She was informed of the importance of frequent follow-up visits to maximize her success with intensive lifestyle modifications for her multiple health conditions.   Objective:   Blood pressure 115/75, pulse 78, temperature 98.1 F (36.7 C), temperature source Oral, height 5\' 3"  (1.6 m), weight 218 lb (98.9 kg), SpO2 97 %. Body mass index is 38.62 kg/m.  General: Cooperative, alert, well developed, in no acute distress. HEENT: Conjunctivae and lids unremarkable. Cardiovascular: Regular rhythm.  Lungs: Normal work of breathing. Neurologic: No focal deficits.   Lab Results  Component Value Date  CREATININE 1.02 07/31/2020   BUN 15 07/31/2020   NA 143 07/31/2020   K 3.5 07/31/2020   CL 110 07/31/2020   CO2 25 07/31/2020   Lab Results  Component Value Date   ALT 22 07/31/2020    AST 20 07/31/2020   ALKPHOS 80 07/31/2020   BILITOT 0.5 07/31/2020   Lab Results  Component Value Date   HGBA1C 4.8 06/21/2019   HGBA1C 4.8 02/03/2019   HGBA1C 4.9 03/20/2016   HGBA1C 5.2 11/21/2014   Lab Results  Component Value Date   INSULIN 31.4 (H) 06/21/2019   Lab Results  Component Value Date   TSH 2.49 07/31/2020   Lab Results  Component Value Date   CHOL 155 01/31/2020   HDL 46 (L) 01/31/2020   LDLCALC 81 01/31/2020   LDLDIRECT 75.0 03/20/2016   TRIG 181 (H) 01/31/2020   CHOLHDL 3.4 01/31/2020   Lab Results  Component Value Date   VD25OH 49.7 06/21/2019   VD25OH 58.34 02/03/2019   VD25OH 45.39 03/20/2016   Lab Results  Component Value Date   WBC 4.6 07/31/2020   HGB 13.2 07/31/2020   HCT 39.3 07/31/2020   MCV 94.7 07/31/2020   PLT 202.0 07/31/2020   Lab Results  Component Value Date   IRON 41 (L) 11/17/2008   FERRITIN 25.1 11/17/2008    Obesity Behavioral Intervention:   Approximately 15 minutes were spent on the discussion below.  ASK: We discussed the diagnosis of obesity with Rinaldo Cloud today and Brailyn agreed to give [place] permission to discuss obesity behavioral modification therapy today.  ASSESS: Vanice has the diagnosis of obesity and her BMI today is 38.6. Katielynn is in the action stage of change.   ADVISE: Anisten was educated on the multiple health risks of obesity as well as the benefit of weight loss to improve her health. She was advised of the need for long term treatment and the importance of lifestyle modifications to improve her current health and to decrease her risk of future health problems.  AGREE: Multiple dietary modification options and treatment options were discussed and Lucette agreed to follow the recommendations documented in the above note.  ARRANGE: Jazalynn was educated on the importance of frequent visits to treat obesity as outlined per CMS and USPSTF guidelines and agreed to schedule her next follow up appointment  today.  Attestation Statements:   Reviewed by clinician on day of visit: allergies, medications, problem list, medical history, surgical history, family history, social history, and previous encounter notes.  Carlos Levering Friedenbach, CMA, am acting as Energy manager for [employer], DO.  I have reviewed the above documentation for accuracy and completeness, and I agree with the above. Helane Rima, DO

## 2020-12-25 ENCOUNTER — Encounter (INDEPENDENT_AMBULATORY_CARE_PROVIDER_SITE_OTHER): Payer: Self-pay | Admitting: Family Medicine

## 2020-12-25 ENCOUNTER — Telehealth: Payer: Self-pay

## 2020-12-25 NOTE — Telephone Encounter (Signed)
[name] stating that she is needing a refill on her test strips. [name] says she went to the eye doctor on Friday for blurry vision, and they suggested that it she start checking her blood sugar levels and her strips expired in 2019.

## 2020-12-25 NOTE — Telephone Encounter (Signed)
Pt last seen by Dr. Wallace.  

## 2020-12-25 NOTE — Telephone Encounter (Signed)
[name] stating that she still hasnt received a call from anyone, advised that Dr.Andy has left for the day and that she just got back from vacation so she hasnt had time to see the message. Patient then proceeded to yell at me stating that it was unacceptable that she hasnt received a call and it shouldn't even take 24 hours for a returned call if she calls in. Kristine Curtis then proceeded to say that she will be finding care else where and to just disregard the message below.

## 2020-12-26 ENCOUNTER — Emergency Department (HOSPITAL_COMMUNITY)
Admission: EM | Admit: 2020-12-26 | Discharge: 2020-12-26 | Disposition: A | Discharge status: Home / Self Care | Payer: Medicare Other | Attending: Emergency Medicine | Admitting: Emergency Medicine

## 2020-12-26 ENCOUNTER — Encounter (INDEPENDENT_AMBULATORY_CARE_PROVIDER_SITE_OTHER): Payer: Self-pay | Admitting: Family Medicine

## 2020-12-26 ENCOUNTER — Telehealth: Payer: Self-pay | Admitting: Pharmacist Clinician (PhC)/ Clinical Pharmacy Specialist

## 2020-12-26 ENCOUNTER — Other Ambulatory Visit: Payer: Self-pay

## 2020-12-26 ENCOUNTER — Emergency Department (HOSPITAL_COMMUNITY): Payer: Medicare Other

## 2020-12-26 DIAGNOSIS — Z5321 Procedure and treatment not carried out due to patient leaving prior to being seen by health care provider: Secondary | ICD-10-CM | POA: Diagnosis not present

## 2020-12-26 DIAGNOSIS — R739 Hyperglycemia, unspecified: Secondary | ICD-10-CM | POA: Insufficient documentation

## 2020-12-26 DIAGNOSIS — Z79899 Other long term (current) drug therapy: Secondary | ICD-10-CM | POA: Diagnosis not present

## 2020-12-26 DIAGNOSIS — R0789 Other chest pain: Secondary | ICD-10-CM | POA: Insufficient documentation

## 2020-12-26 DIAGNOSIS — R519 Headache, unspecified: Secondary | ICD-10-CM | POA: Insufficient documentation

## 2020-12-26 DIAGNOSIS — I509 Heart failure, unspecified: Secondary | ICD-10-CM | POA: Insufficient documentation

## 2020-12-26 DIAGNOSIS — R531 Weakness: Secondary | ICD-10-CM | POA: Diagnosis not present

## 2020-12-26 DIAGNOSIS — R5383 Other fatigue: Secondary | ICD-10-CM | POA: Diagnosis present

## 2020-12-26 LAB — COMPREHENSIVE METABOLIC PANEL
ALT: 37 U/L (ref 0–44)
AST: 34 U/L (ref 15–41)
Albumin: 4.3 g/dL (ref 3.5–5.0)
Alkaline Phosphatase: 96 U/L (ref 38–126)
Anion gap: 6 (ref 5–15)
BUN: 13 mg/dL (ref 6–20)
CO2: 24 mmol/L (ref 22–32)
Calcium: 9 mg/dL (ref 8.9–10.3)
Chloride: 109 mmol/L (ref 98–111)
Creatinine, Ser: 0.96 mg/dL (ref 0.44–1.00)
GFR, Estimated: >60 mL/min (ref >60)
Glucose, Bld: 94 mg/dL (ref 70–99)
Potassium: 3.7 mmol/L (ref 3.5–5.1)
Sodium: 139 mmol/L (ref 135–145)
Total Bilirubin: 1.1 mg/dL (ref 0.3–1.2)
Total Protein: 6.8 g/dL (ref 6.5–8.1)

## 2020-12-26 LAB — CBC WITH DIFFERENTIAL/PLATELET
Abs Immature Granulocytes: 0.01 10*3/uL (ref 0.00–0.07)
Basophils Absolute: 0 10*3/uL (ref 0.0–0.1)
Basophils Relative: 1 %
Eosinophils Absolute: 0.1 10*3/uL (ref 0.0–0.5)
Eosinophils Relative: 3 %
HCT: 43.8 % (ref 36.0–46.0)
Hemoglobin: 14.2 g/dL (ref 12.0–15.0)
Immature Granulocytes: 0 %
Lymphocytes Relative: 38 %
Lymphs Abs: 1.3 10*3/uL (ref 0.7–4.0)
MCH: 31.6 pg (ref 26.0–34.0)
MCHC: 32.4 g/dL (ref 30.0–36.0)
MCV: 97.3 fL (ref 80.0–100.0)
Monocytes Absolute: 0.2 10*3/uL (ref 0.1–1.0)
Monocytes Relative: 6 %
Neutro Abs: 1.8 10*3/uL (ref 1.7–7.7)
Neutrophils Relative %: 52 %
Platelets: 202 10*3/uL (ref 150–400)
RBC: 4.5 MIL/uL (ref 3.87–5.11)
RDW: 13.1 % (ref 11.5–15.5)
WBC: 3.5 10*3/uL — ABNORMAL LOW (ref 4.0–10.5)
nRBC: 0 % (ref 0.0–0.2)

## 2020-12-26 LAB — TROPONIN I (HIGH SENSITIVITY): Troponin I (High Sensitivity): 2 ng/L (ref <18)

## 2020-12-26 LAB — CBG MONITORING, ED: Glucose-Capillary: 83 mg/dL (ref 70–99)

## 2020-12-26 LAB — BRAIN NATRIURETIC PEPTIDE: B Natriuretic Peptide: 18.2 pg/mL (ref 0.0–100.0)

## 2020-12-26 NOTE — ED Triage Notes (Signed)
States that she is here for concerns for her blood sugar, CBG in the 80s in ED. Complaints of weakness, fatigue and multiple complaints. Not being able to sleep, focused on her heart beat at night. Denies SOB.

## 2020-12-26 NOTE — Telephone Encounter (Signed)
One Touch Ultra test strips

## 2020-12-26 NOTE — Telephone Encounter (Signed)
Meter and strips sent in by Dr. Earlene Plater. I am closing encounter

## 2020-12-26 NOTE — Telephone Encounter (Signed)
Did you get the name of test strips? Nothing on her current med list. Please make sure to always get the brand of test strips so additional phone calls can be avoided.

## 2020-12-26 NOTE — ED Provider Notes (Signed)
Emergency Medicine Provider Triage Evaluation Note  Kristine Curtis , a 60 y.o. female  was evaluated in triage.  Pt complains of ongoing issues with fatigue, chest tightness, weakness, and headache.  She reports a long history of health issues secondary to long-haul COVID.  Most recently she is concerned because she believes that she was extremely hyperglycemic due to expired test strips.  After being informed that her sugar is normal today, she is concerned because she has had chest tightness, fatigue, and instability.  She reports a history of congestive heart failure, and has had many medication changes recently.  Review of Systems  Positive: Chest tightness, fatigue, weakness, headache Negative: Radiating chest pain, shortness of breath  Physical Exam  BP (!) 148/70 (BP Location: Right Arm)   Pulse 77   Temp 98.2 F (36.8 C) (Oral)   Resp 16   Ht 5\' 3"  (1.6 m)   Wt 100.7 kg   SpO2 100%   BMI 39.33 kg/m  Gen:   Awake, no distress Resp:  Normal effort MSK:   Moves extremities without difficult CV:  Regular rate and rhythm with no murmurs rubs or gallops.  Medical Decision Making  Medically screening exam initiated at 12:36 PM.  Appropriate orders placed.  SHALUNDA LINDH was informed that the remainder of the evaluation will be completed by another provider, this initial triage assessment does not replace that evaluation, and the importance of remaining in the ED until their evaluation is complete.  Fatigue, weakness, headache, chest tightness   Bland Span, PA-C 12/26/20 1239    12/28/20, DO 12/26/20 1537

## 2020-12-26 NOTE — Telephone Encounter (Signed)
I spoke with the patient and advised her to go straight to the emergency room or call 911. Patient verbalized understanding. Denna Fryberger, CMA

## 2020-12-26 NOTE — Telephone Encounter (Signed)
Patient reports that since stopping Entresto (held due to suspected cough), and starting losartan she has been feeling sickly, nauseated and lethargic.  Actually went to ED today but they didn't find anything concerning.   She also has recently seen her PCP for problems with vaginal dryness and was given something to treat that.  Unable to determine if she is using Premarin vaginal cream or some other product.   Explained that we can stop losartan for 2 weeks to see if she feels any better.  If she does feel better, she will need to re-challenge with losartan, as her symptoms could be related to hormone treatment or some viral infection.  Patient agreeable to this.  She will keep appointment scheduled for Sept 9.

## 2020-12-27 MED ORDER — ONETOUCH ULTRA VI STRP: ORAL_STRIP | 0 refills | Status: AC

## 2020-12-27 MED ORDER — ONETOUCH ULTRASOFT LANCETS MISC: 0 refills | Status: AC

## 2021-01-11 ENCOUNTER — Ambulatory Visit (INDEPENDENT_AMBULATORY_CARE_PROVIDER_SITE_OTHER): Payer: Medicare Other | Admitting: Family Medicine

## 2021-01-11 ENCOUNTER — Other Ambulatory Visit: Payer: Self-pay

## 2021-01-11 ENCOUNTER — Ambulatory Visit (INDEPENDENT_AMBULATORY_CARE_PROVIDER_SITE_OTHER): Payer: Medicare Other | Admitting: Pharmacist Clinician (PhC)/ Clinical Pharmacy Specialist

## 2021-01-11 ENCOUNTER — Encounter (INDEPENDENT_AMBULATORY_CARE_PROVIDER_SITE_OTHER): Payer: Self-pay | Admitting: Family Medicine

## 2021-01-11 VITALS — BP 137/86 | HR 76 | Temp 97.9°F | Ht 63.0 in | Wt 215.0 lb

## 2021-01-11 DIAGNOSIS — G8929 Other chronic pain: Secondary | ICD-10-CM

## 2021-01-11 DIAGNOSIS — I5022 Chronic systolic (congestive) heart failure: Secondary | ICD-10-CM | POA: Diagnosis not present

## 2021-01-11 DIAGNOSIS — R110 Nausea: Secondary | ICD-10-CM

## 2021-01-11 DIAGNOSIS — Z79899 Other long term (current) drug therapy: Secondary | ICD-10-CM

## 2021-01-11 DIAGNOSIS — Z6841 Body Mass Index (BMI) 40.0 and over, adult: Secondary | ICD-10-CM | POA: Diagnosis not present

## 2021-01-11 DIAGNOSIS — I428 Other cardiomyopathies: Secondary | ICD-10-CM

## 2021-01-11 MED ORDER — ONDANSETRON 4 MG PO TBDP
4.0000 mg | ORAL_TABLET | Freq: Three times a day (TID) | ORAL | 0 refills | Status: AC | PRN
Start: 2021-01-11 — End: ?

## 2021-01-11 NOTE — Progress Notes (Signed)
HPI:  Kristine Curtis is a 60 y.o. female patient of Dr [place], with a PMH below who presents today for heart failure medication titration. EF was at 35-40% on previous cardiac cath, but most recent limited echo showed an increase to 40-45%. We increased Entresto dose to 49/51mg  twice daily . She is also on carvedilol 12.5 mg twice daily, sprionolactone 25 mg daily, and furosemide 20 mg daily. She is recovering from COVID and still experiencing some cough. Otherwise, doing well and compliance with all therapy.  She has been having some long-haul covid issues. A chest [facility] at [facility] noted no cause of cough, but did find some nodules that will be monitored.     Last seen 6 months ago by Dr. [place].  Today she complains of ongoing cough and severe hair loss.  She notes she has always had some hair loss when shampooing her hair, but has been more in the past several months.  Her only other concern is 2 recent episodes of losing her balance when bending over.  She stumbled and managed to catch herself before falling face first.  She is now working with [facility], states trying to listen to her body more and turn off emotional eating  At her last visit we stopped the [place] and restarted losartan.  Patient noted that her cough stopped within about 48 hours.  About the same time she decided to stop her cyclobenzaprine, meloxicam and naltrexone.  She had been on the cyclobenzaprine twice daily for close to 30 years and tapered off over a 30 day period.   Unfortunately she then developed what appears to be withdrawal symptoms and went to the ED with headache, nausea and lethargy.  She is starting to feel better, but still plagued with ongoing headaches and nausea.  She was concerned about Jardiance causing hypoglycemia, so she checked her blood sugars for a couple of weeks and has those numbers today.  The lowest was 98 and I assured her that her ill health had nothing to do with  hypoglycemia.     Past Medical History: hypertension Controlled on entresto and carvedilol  hypothyroidism levothyroixine 100 mcg  Chronic back pain On long term percocet/cyclobenzaprine prn     Blood Pressure Goal:  130/80  Current Medications:  Losartan 50 mg once daily Carvedilol 12.5 mg twice daily Spironolactone 25 mg daily Furosemide 20mg  daily Jardiance 10mg  daily  Family Hx: father has AF, hypertension (started in late Sep 13, 2022); now 73; mother died at 53 from pancreatic cancer; one brother died last month at 29 - kidney failure/hypertension; another sister, brother, no specific issues; 2 children, daughter with gestational hypertension/preeclampsia; son with hypertension, recovering alcoholic, (his BP better now since he is 6 years sober)  Social Hx: no tobacco, no alcohol, is addicted to coke/pepsi - can drink as much as 6-8 cans per day, but states may then go several days without  Diet: tries to eat more at home, but does eat out some, no added salt; tries to eat more grilled than fried (uses coconut oil spray).  Next 56 days eating plan; working with [facility] as well (has gained 9 pounds since starting in February).   Exercise: yoga 3 days per week - started recently  Home BP readings: no readings with her today, but notes most home readings are < 120/80  Intolerances: liraglutide caused depression  Labs:  9/21:  Na 142, K 3.8, Glu 99, BUN 9, SCr 0.9,  GFR 70  Wt Readings from Last 3 Encounters:  01/11/21 221 lb (100.2 kg)  01/11/21 215 lb (97.5 kg)  12/26/20 222 lb (100.7 kg)   BP Readings from Last 3 Encounters:  01/11/21 132/80  01/11/21 137/86  12/26/20 130/84   Pulse Readings from Last 3 Encounters:  01/11/21 80  01/11/21 76  12/26/20 72    Current Outpatient Medications  Medication Sig Dispense Refill   albuterol (VENTOLIN HFA) 108 (90 Base) MCG/ACT inhaler Inhale 2 puffs into the lungs every 6 (six) hours as needed for wheezing or  shortness of breath. 8 g 3   buPROPion (WELLBUTRIN XL) 300 MG 24 hr tablet TAKE 1 TABLET BY MOUTH DAILY 30 tablet 11   carvedilol (COREG) 12.5 MG tablet TAKE ONE TABLET TWICE DAILY WITH A MEAL 180 tablet 3   Cholecalciferol (VITAMIN D-3) 25 MCG (1000 UT) CAPS Take 1,000 Units by mouth daily.      diazepam (VALIUM) 5 MG tablet Take 1 tablet (5 mg total) by mouth daily as needed for anxiety. 30 tablet 2   Fluticasone-Salmeterol (ADVAIR) 100-50 MCG/DOSE AEPB Inhale 1 puff into the lungs 2 (two) times daily. 1 each 3   furosemide (LASIX) 20 MG tablet Take 1 tablet (20 mg total) by mouth daily as needed for edema. 90 tablet 3   Galcanezumab-gnlm 120 MG/ML SOAJ Inject 120 mg into the skin every 30 (thirty) days.     glucose blood (ONETOUCH ULTRA) test strip Use to check blood sugar TID 100 each 0   hydroxychloroquine (PLAQUENIL) 200 MG tablet Take 2 tablets (400 mg total) by mouth every morning. 90 tablet 2   hydrOXYzine (ATARAX/VISTARIL) 25 MG tablet Take 1 tablet (25 mg total) by mouth at bedtime as needed for itching (insomnia). 180 tablet 1   JARDIANCE 10 MG TABS tablet TAKE ONE TABLET EACH MORNING BEFORE BREAKFAST 90 tablet 3   Lancets (ONETOUCH ULTRASOFT) lancets Use to check blood sugar TID 100 each 0   levothyroxine (SYNTHROID) 100 MCG tablet TAKE ONE TABLET EVERY DAY 90 tablet 3   losartan (COZAAR) 50 MG tablet Take 1 tablet (50 mg total) by mouth daily. 30 tablet 3   nystatin-triamcinolone ointment (MYCOLOG) Apply 1 application topically 2 (two) times daily. 30 g 5   omeprazole (PRILOSEC) 40 MG capsule TAKE 1 CAPSULE EVERY DAY 90 capsule 3   OVER THE COUNTER MEDICATION Take 5 capsules by mouth daily. Ariix Optimals Vitamin & Minerals     OVER THE COUNTER MEDICATION Take 1 scoop by mouth daily. Ariix Magnecal D     rizatriptan (MAXALT) 10 MG tablet Take 10 mg by mouth as needed for migraine. May repeat in 2 hours if needed     spironolactone (ALDACTONE) 25 MG tablet TAKE ONE TABLET EVERY DAY  90 tablet 3   topiramate (TOPAMAX) 25 MG tablet Take 1 tablet (25 mg total) by mouth 2 (two) times daily. 60 tablet 3   Wheat Dextrin (BENEFIBER) POWD Take 2 scoop by mouth daily as needed (for constipation).      ondansetron (ZOFRAN ODT) 4 MG disintegrating tablet Take 1 tablet (4 mg total) by mouth every 8 (eight) hours as needed for nausea or vomiting. 24 tablet 0   No current facility-administered medications for this visit.    Allergies  Allergen Reactions   Gabapentin (Once-Daily)     Depression    Saxenda [Liraglutide -Weight Management] Other (See Comments)    Depression    Sulfa Antibiotics     itching  Trintellix [Vortioxetine] Other (See Comments)    GI issues    Adhesive [Tape] Rash   Codeine Itching    Past Medical History:  Diagnosis Date   Allergic rhinitis due to pollen    Anxiety    Asthma    Carpal tunnel syndrome of right wrist 12/22/2017   Chronic combined systolic and diastolic CHF (congestive heart failure) (HCC)    06/2016 Echo: EF 40-45%, Gr1 DD   Chronic fatigue    Depression    Family history of polycystic kidney with negative CT 11/16/2012   Fibromyalgia    GERD (gastroesophageal reflux disease)    Hypertension    Hypothyroidism    IBS (irritable bowel syndrome)    Internal hemorrhoids    Lymphocytic colitis    Morbid obesity (HCC)    NICM (nonischemic cardiomyopathy) (HCC)    a. 1999 Cath: nl cors;  b. EF prev as low as 35%;  c. 11/2015 Echo: Ef 40-45%;  d. 06/2016 Echo: EF 40-45%;  e. Lexiscan MV: fixed anterosepta/inferseptal defect w/o ischemia, EF 35%.   Osteoarthritis    PONV (postoperative nausea and vomiting)    PVC (premature ventricular contraction)    Restless leg syndrome    Sjogren's syndrome (HCC)    Vertigo     Blood pressure 132/80, pulse 80, resp. rate 15, height 5\' 3"  (1.6 m), weight 221 lb (100.2 kg), SpO2 98 %.  Nonischemic cardiomyopathy (HCC) Patient with HFrEF, most recently improved to 40-45%, now on GDMT.  She had  problems with Entresto causing cough, but has done better now back on losartan.  Her BP is doing well at home and is only slightly elevated in the office today.  Will have her continue with current regimen of medications, and she was advised to contact our office should her BP increase significantly or she has any problems with her medications.    PharmD CPP [facility] [address] [place] 01/12/2021 7:46 AM

## 2021-01-11 NOTE — Patient Instructions (Signed)
Call with any concerns or questions:  Kristine Curtis/Chris at [phone]  Check your blood pressure at home daily (if able) and keep record of the readings.  Take your BP meds as follows  Losartan 50 mg once daily   Carvedilol 12.5 mg twice daily Spironolactone 25 mg daily Furosemide 20mg  daily Jardiance 10mg  daily   Bring all of your meds, your BP cuff and your record of home blood pressures to your next appointment.  Exercise as you're able, try to walk approximately 30 minutes per day.  Keep salt intake to a minimum, especially watch canned and prepared boxed foods.  Eat more fresh fruits and vegetables and fewer canned items.  Avoid eating in fast food restaurants.    HOW TO TAKE YOUR BLOOD PRESSURE: Rest 5 minutes before taking your blood pressure.  Don't smoke or drink caffeinated beverages for at least 30 minutes before. Take your blood pressure before (not after) you eat. Sit comfortably with your back supported and both feet on the floor (don't cross your legs). Elevate your arm to heart level on a table or a desk. Use the proper sized cuff. It should fit smoothly and snugly around your bare upper arm. There should be enough room to slip a fingertip under the cuff. The bottom edge of the cuff should be 1 inch above the crease of the elbow. Ideally, take 3 measurements at one sitting and record the average.

## 2021-01-12 ENCOUNTER — Encounter: Payer: Self-pay | Admitting: Pharmacist Clinician (PhC)/ Clinical Pharmacy Specialist

## 2021-01-12 NOTE — Assessment & Plan Note (Signed)
Patient with HFrEF, most recently improved to 40-45%, now on GDMT.  She had problems with Entresto causing cough, but has done better now back on losartan.  Her BP is doing well at home and is only slightly elevated in the office today.  Will have her continue with current regimen of medications, and she was advised to contact our office should her BP increase significantly or she has any problems with her medications.

## 2021-01-15 NOTE — Progress Notes (Signed)
Chief Complaint:   OBESITY Kristine Curtis is here to discuss her progress with her obesity treatment plan along with follow-up of her obesity related diagnoses.   Today's visit was #: 18 Starting weight: 226 lbs Starting date: 06/21/2019 Today's weight: 215 lbs Today's date: 01/11/2021 Weight change since last visit: 3 lbs Total lbs lost to date: 11 lbs Body mass index is 38.09 kg/m.  Total weight loss percentage to date: -4.87%  Current Meal Plan: practicing portion control and making smarter food choices, such as increasing vegetables and decreasing simple carbohydrates for 100% of the time.  Current Exercise Plan: None.  Interim History:  Kristine Curtis stopped naltrexone and went back on losartan.  She says she was very nauseated - could not tolerate water.  She had dizziness, headache - severe, blurry vision, pain, nausea, palpitations, chills/sweats, heavy limbs, extreme fatigue, and could not sleep.  She is now down to 3 symptoms:  Dizziness, headache, and nausea (improving).  Assessment/Plan:   1. Nausea Kristine Curtis will start Zofran 4 mg every 8 hours as needed for nausea or vomiting.  - Start ondansetron (ZOFRAN ODT) 4 MG disintegrating tablet; Take 1 tablet (4 mg total) by mouth every 8 (eight) hours as needed for nausea or vomiting.  Dispense: 24 tablet; Refill: 0  2. Other chronic pain Not optimized. Kristine Curtis has a great understanding of exercise, stress reduction, and whole foods-based diet to augment treatment for chronic pain. She is starting to get back to her regimen.  She was taking naltrexone for her pain, but she recently stopped taking it.  3. Chronic systolic congestive heart failure (HCC), EF 35-40% Stable to improved. On Jardiance per Cardiology, so holding off on starting GLP1RA. Disease process and medications were reviewed with an emphasis on salt restriction, regular exercise, and regular weight monitoring. We will continue to monitor symptoms as they relate to her weight loss  journey.  4. Medication management Medication Reconciliation today.  5. Obesity BMI today 38.2  Course: Kristine Curtis is currently in the action stage of change. As such, her goal is to continue with weight loss efforts.   Nutrition goals: She has agreed to practicing portion control and making smarter food choices, such as increasing vegetables and decreasing simple carbohydrates.   Exercise goals: All adults should avoid inactivity. Some physical activity is better than none, and adults who participate in any amount of physical activity gain some health benefits.  Behavioral modification strategies: increasing lean protein intake, decreasing simple carbohydrates, increasing vegetables, increasing water intake, and emotional eating strategies.  Kristine Curtis has agreed to follow-up with our clinic in 4 weeks. She was informed of the importance of frequent follow-up visits to maximize her success with intensive lifestyle modifications for her multiple health conditions.   Objective:   Blood pressure 137/86, pulse 76, temperature 97.9 F (36.6 C), temperature source Oral, height 5\' 3"  (1.6 m), weight 215 lb (97.5 kg), SpO2 96 %. Body mass index is 38.09 kg/m.  General: Cooperative, alert, well developed, in no acute distress. HEENT: Conjunctivae and lids unremarkable. Cardiovascular: Regular rhythm.  Lungs: Normal work of breathing. Neurologic: No focal deficits.   Lab Results  Component Value Date   CREATININE 0.96 12/26/2020   BUN 13 12/26/2020   NA 139 12/26/2020   K 3.7 12/26/2020   CL 109 12/26/2020   CO2 24 12/26/2020   Lab Results  Component Value Date   ALT 37 12/26/2020   AST 34 12/26/2020   ALKPHOS 96 12/26/2020   BILITOT 1.1  12/26/2020   Lab Results  Component Value Date   HGBA1C 4.8 06/21/2019   HGBA1C 4.8 02/03/2019   HGBA1C 4.9 03/20/2016   HGBA1C 5.2 11/21/2014   Lab Results  Component Value Date   INSULIN 31.4 (H) 06/21/2019   Lab Results  Component Value  Date   TSH 2.49 07/31/2020   Lab Results  Component Value Date   CHOL 155 01/31/2020   HDL 46 (L) 01/31/2020   LDLCALC 81 01/31/2020   LDLDIRECT 75.0 03/20/2016   TRIG 181 (H) 01/31/2020   CHOLHDL 3.4 01/31/2020   Lab Results  Component Value Date   VD25OH 49.7 06/21/2019   VD25OH 58.34 02/03/2019   VD25OH 45.39 03/20/2016   Lab Results  Component Value Date   WBC 3.5 (L) 12/26/2020   HGB 14.2 12/26/2020   HCT 43.8 12/26/2020   MCV 97.3 12/26/2020   PLT 202 12/26/2020   Lab Results  Component Value Date   IRON 41 (L) 11/17/2008   FERRITIN 25.1 11/17/2008   Obesity Behavioral Intervention:   Approximately 15 minutes were spent on the discussion below.  ASK: We discussed the diagnosis of obesity with Kristine Curtis today and Kristine Curtis agreed to give [place] permission to discuss obesity behavioral modification therapy today.  ASSESS: Kristine Curtis has the diagnosis of obesity and her BMI today is 38.2. [name] is in the action stage of change.   ADVISE: Kristine Curtis was educated on the multiple health risks of obesity as well as the benefit of weight loss to improve her health. She was advised of the need for long term treatment and the importance of lifestyle modifications to improve her current health and to decrease her risk of future health problems.  AGREE: Multiple dietary modification options and treatment options were discussed and Kristine Curtis agreed to follow the recommendations documented in the above note.  ARRANGE: Kristine Curtis was educated on the importance of frequent visits to treat obesity as outlined per CMS and USPSTF guidelines and agreed to schedule her next follow up appointment today.  Attestation Statements:   Reviewed by clinician on day of visit: allergies, medications, problem list, medical history, surgical history, family history, social history, and previous encounter notes.  I, Insurance claims handler, CMA, am acting as transcriptionist for Helane Rima, DO  I have reviewed the above  documentation for accuracy and completeness, and I agree with the above. Helane Rima, DO

## 2021-02-01 ENCOUNTER — Ambulatory Visit (INDEPENDENT_AMBULATORY_CARE_PROVIDER_SITE_OTHER): Payer: Medicare Other | Admitting: Family Medicine

## 2021-02-01 ENCOUNTER — Encounter: Payer: Self-pay | Admitting: Family Medicine

## 2021-02-01 ENCOUNTER — Other Ambulatory Visit: Payer: Self-pay

## 2021-02-01 VITALS — BP 130/80 | HR 78 | Temp 97.9°F | Ht 63.0 in | Wt 221.4 lb

## 2021-02-01 DIAGNOSIS — E039 Hypothyroidism, unspecified: Secondary | ICD-10-CM

## 2021-02-01 DIAGNOSIS — Z0000 Encounter for general adult medical examination without abnormal findings: Secondary | ICD-10-CM

## 2021-02-01 DIAGNOSIS — Z23 Encounter for immunization: Secondary | ICD-10-CM | POA: Diagnosis not present

## 2021-02-01 DIAGNOSIS — I10 Essential (primary) hypertension: Secondary | ICD-10-CM

## 2021-02-01 DIAGNOSIS — L304 Erythema intertrigo: Secondary | ICD-10-CM | POA: Diagnosis not present

## 2021-02-01 DIAGNOSIS — M797 Fibromyalgia: Secondary | ICD-10-CM | POA: Diagnosis not present

## 2021-02-01 DIAGNOSIS — I428 Other cardiomyopathies: Secondary | ICD-10-CM | POA: Diagnosis not present

## 2021-02-01 DIAGNOSIS — G4733 Obstructive sleep apnea (adult) (pediatric): Secondary | ICD-10-CM

## 2021-02-01 DIAGNOSIS — F418 Other specified anxiety disorders: Secondary | ICD-10-CM

## 2021-02-01 LAB — COMPREHENSIVE METABOLIC PANEL
ALT: 31 U/L (ref 0–35)
AST: 28 U/L (ref 0–37)
Albumin: 3.8 g/dL (ref 3.5–5.2)
Alkaline Phosphatase: 79 U/L (ref 39–117)
BUN: 15 mg/dL (ref 6–23)
CO2: 25 mEq/L (ref 19–32)
Calcium: 8.9 mg/dL (ref 8.4–10.5)
Chloride: 108 mEq/L (ref 96–112)
Creatinine, Ser: 1.06 mg/dL (ref 0.40–1.20)
GFR: 56.92 mL/min — ABNORMAL LOW (ref >60.00)
Glucose, Bld: 87 mg/dL (ref 70–99)
Potassium: 3.6 mEq/L (ref 3.5–5.1)
Sodium: 141 mEq/L (ref 135–145)
Total Bilirubin: 0.6 mg/dL (ref 0.2–1.2)
Total Protein: 6.1 g/dL (ref 6.0–8.3)

## 2021-02-01 LAB — CBC WITH DIFFERENTIAL/PLATELET
Basophils Absolute: 0 10*3/uL (ref 0.0–0.1)
Basophils Relative: 0.8 % (ref 0.0–3.0)
Eosinophils Absolute: 0.1 10*3/uL (ref 0.0–0.7)
Eosinophils Relative: 3.2 % (ref 0.0–5.0)
HCT: 40.2 % (ref 36.0–46.0)
Hemoglobin: 13.3 g/dL (ref 12.0–15.0)
Lymphocytes Relative: 35.5 % (ref 12.0–46.0)
Lymphs Abs: 1.4 10*3/uL (ref 0.7–4.0)
MCHC: 33 g/dL (ref 30.0–36.0)
MCV: 96.6 fl (ref 78.0–100.0)
Monocytes Absolute: 0.3 10*3/uL (ref 0.1–1.0)
Monocytes Relative: 6.7 % (ref 3.0–12.0)
Neutro Abs: 2.1 10*3/uL (ref 1.4–7.7)
Neutrophils Relative %: 53.8 % (ref 43.0–77.0)
Platelets: 173 10*3/uL (ref 150.0–400.0)
RBC: 4.17 Mil/uL (ref 3.87–5.11)
RDW: 13.5 % (ref 11.5–15.5)
WBC: 3.8 10*3/uL — ABNORMAL LOW (ref 4.0–10.5)

## 2021-02-01 LAB — LIPID PANEL
Cholesterol: 164 mg/dL (ref 0–200)
HDL: 51.5 mg/dL (ref >39.00)
LDL Cholesterol: 91 mg/dL (ref 0–99)
NonHDL: 112.62
Total CHOL/HDL Ratio: 3
Triglycerides: 109 mg/dL (ref 0.0–149.0)
VLDL: 21.8 mg/dL (ref 0.0–40.0)

## 2021-02-01 LAB — TSH: TSH: 7.05 u[IU]/mL — ABNORMAL HIGH (ref 0.35–5.50)

## 2021-02-01 MED ORDER — OXYCODONE-ACETAMINOPHEN 10-325 MG PO TABS: 1.0000 | ORAL_TABLET | Freq: Every day | ORAL | 0 refills | Status: DC | PRN

## 2021-02-01 MED ORDER — NYSTATIN-TRIAMCINOLONE 100000-0.1 UNIT/GM-% EX OINT
1.0000 | TOPICAL_OINTMENT | Freq: Two times a day (BID) | CUTANEOUS | 2 refills | Status: AC | PRN
Start: 2021-02-01 — End: ?

## 2021-02-01 MED ORDER — DIAZEPAM 5 MG PO TABS: 5.0000 mg | ORAL_TABLET | Freq: Every day | ORAL | 2 refills | Status: DC | PRN

## 2021-02-01 NOTE — Patient Instructions (Signed)
Please return in 12 months for your annual complete physical; please come fasting.   I will release your lab results to you on your MyChart account with further instructions. Please reply with any questions.    Today you were given your Flu vaccination.  You may get your shingrix vaccination anytime.   If you have any questions or concerns, please don't hesitate to send me a message via MyChart or call the office at [phone]. Thank you for visiting with [place] today! It's our pleasure caring for you.

## 2021-02-01 NOTE — Progress Notes (Signed)
Subjective  Chief Complaint  Patient presents with   Annual Exam    Fasting   Hypertension   Hypothyroidism   Depression    HPI: Kristine Curtis is a 60 y.o. female who presents to [facility] today for a Female Wellness Visit. She also has the concerns and/or needs as listed above in the chief complaint. These will be addressed in addition to the Health Maintenance Visit.   Wellness Visit: annual visit with health maintenance review and exam without Pap  HM: screens are up to date. Seeing healthy weight and wellness. Reviewed notes. Pt to get shingrix at her convenience. Flu shot today Chronic disease f/u and/or acute problem visit: (deemed necessary to be done in addition to the wellness visit): Fibromyalgia w/ occ flares: had tried naltrexone but stopped. Requesting oxy refill. Uses rarely. Last filled 02/2020, #30. I reviewed patient's records from the PMP aware controlled substance registry today. Sees rheum.  Requests nystatin / tac cream for intermittent intertrigo Low thyroid: energy is stable. Due for recheck.  Mood is ok on duloxetine and wellbutrin.  HTN/CHF: reviewed cards notes; off entresto and back on losartan with aldactone and carvedilol. Doing well. Volume status is stable on lasix as needed. No sob or cp or palpitations Obesity per healthy weight and wellness. OSA and uses valium prn sleep  Assessment  1. Annual physical exam   2. Fibromyalgia   3. Intertrigo   4. Nonischemic cardiomyopathy (HCC)   5. Acquired hypothyroidism   6. Depression with anxiety   7. Essential hypertension   8. Morbid obesity (HCC)   9. OSA (obstructive sleep apnea)   10. Need for immunization against influenza      Plan  Female Wellness Visit: Age appropriate Health Maintenance and Prevention measures were discussed with patient. Included topics are cancer screening recommendations, ways to keep healthy (see AVS) including dietary and exercise  recommendations, regular eye and dental care, use of seat belts, and avoidance of moderate alcohol use and tobacco use. Utd screen BMI: discussed patient's BMI and encouraged positive lifestyle modifications to help get to or maintain a target BMI. HM needs and immunizations were addressed and ordered. See below for orders. See HM and immunization section for updates. Flu shottoday. Pt to get shingrix Routine labs and screening tests ordered including cmp, cbc and lipids where appropriate. Discussed recommendations regarding Vit D and calcium supplementation (see AVS)  Chronic disease management visit and/or acute problem visit: Fibro: stable on meds: gaba and cymbalta. Discussed rare use of oxycodeine. Refilled #30. No longer on naltrexone Obesity: diet  Recheck thyroid  CHF/HTN: check lytes and renal function and lipids. Euvolemic Intertrigo: nystatin /tac cream refilled.   Follow up: 12 mo for cpe  Orders Placed This Encounter  Procedures   Flu Vaccine QUAD 63mo+IM (Fluarix, Fluzone & Alfiuria Quad PF)   CBC with Differential/Platelet   Comprehensive metabolic panel   Lipid panel   TSH   Meds ordered this encounter  Medications   diazepam (VALIUM) 5 MG tablet    Sig: Take 1 tablet (5 mg total) by mouth daily as needed for anxiety.    Dispense:  30 tablet    Refill:  2   oxyCODONE-acetaminophen (PERCOCET) 10-325 MG tablet    Sig: Take 1 tablet by mouth daily as needed for pain.    Dispense:  30 tablet    Refill:  0   nystatin-triamcinolone ointment (MYCOLOG)    Sig: Apply 1 application topically  2 (two) times daily as needed.    Dispense:  60 g    Refill:  2      Body mass index is 39.22 kg/m. Wt Readings from Last 3 Encounters:  02/01/21 221 lb 6.4 oz (100.4 kg)  01/11/21 221 lb (100.2 kg)  01/11/21 215 lb (97.5 kg)     Patient Active Problem List   Diagnosis Date Noted   OSA (obstructive sleep apnea) 11/10/2017    Priority: 1.   Myofascial pain dysfunction  syndrome 07/14/2017    Priority: 1.   Morbid obesity (HCC) 02/24/2017    Priority: 1.   Sjogren's disease (HCC) 02/24/2017    Priority: 1.   Fibromyalgia 01/21/2017    Priority: 1.   Chronic low back pain with sciatica 05/02/2015    Priority: 1.    MRI lumbar 2016 [facility], NS and uses meloxicam with prn oxycodone and valium for mm spasms    Nonischemic cardiomyopathy (HCC) 03/22/2015    Priority: 1.    06/28/16 ECHO : EF 35% 07/2020 Echo: EF 55-60%; normalized!      Depression with anxiety 08/31/2008    Priority: 1.    Failed cymbalta, prozac and trintellix Tolerates wellbutrin     Essential hypertension 08/31/2008    Priority: 1.   Acquired hypothyroidism 08/30/2008    Priority: 1.   Hormone replacement therapy (HRT) 07/29/2019    Priority: 2.   IBS (irritable bowel syndrome) 11/10/2017    Priority: 2.   RLS (restless legs syndrome) 10/01/2017    Priority: 2.   Moderate persistent asthma 08/31/2008    Priority: 2.   Esophageal reflux 08/31/2008    Priority: 2.   Classical migraine without intractable migraine 08/30/2008    Priority: 2.   B12 deficiency 11/10/2017    Priority: 3.   Myalgia due to statin 11/10/2017    Priority: 3.   Degenerative arthritis of left knee, XRAY12/2018 07/13/2017    Priority: 3.   Vitamin D deficiency 10/09/2009    Priority: 3.   Allergic rhinitis 08/31/2008    Priority: 3.   Lumbar disc disease 10/01/2017   Cervical radiculopathy 07/14/2017   Health Maintenance  Topic Date Due   Zoster Vaccines- Shingrix (1 of 2) Never done   MAMMOGRAM  01/04/2022   TETANUS/TDAP  02/07/2025   COLONOSCOPY (Pts 45-74yrs Insurance coverage will need to be confirmed)  06/28/2029   INFLUENZA VACCINE  Completed   Hepatitis C Screening  Completed   HIV Screening  Completed   HPV VACCINES  Aged Out   COVID-19 Vaccine  Discontinued   Immunization History  Administered Date(s) Administered   Influenza,inj,Quad PF,6+ Mos 02/08/2015, 02/10/2018,  02/03/2019, 01/31/2020, 02/01/2021   Tdap 02/08/2015   We updated and reviewed the patient's past history in detail and it is documented below. Allergies: Patient is allergic to gabapentin (once-daily), saxenda [liraglutide -weight management], sulfa antibiotics, trintellix [vortioxetine], adhesive [tape], and codeine. Past Medical History Patient  has a past medical history of Allergic rhinitis due to pollen, Anxiety, Asthma, Carpal tunnel syndrome of right wrist (12/22/2017), Chronic combined systolic and diastolic CHF (congestive heart failure) (HCC), Chronic fatigue, Depression, Family history of polycystic kidney with negative CT (11/16/2012), Fibromyalgia, GERD (gastroesophageal reflux disease), Hypertension, Hypothyroidism, IBS (irritable bowel syndrome), Internal hemorrhoids, Lymphocytic colitis, Morbid obesity (HCC), NICM (nonischemic cardiomyopathy) (HCC), Osteoarthritis, PONV (postoperative nausea and vomiting), PVC (premature ventricular contraction), Restless leg syndrome, Sjogren's syndrome (HCC), and Vertigo. Past Surgical History Patient  has a past surgical history that includes Abdominal  hysterectomy; Cesarean section; Tubal ligation (1992); Cholecystectomy (10-2008); Shoulder arthroscopy w/ subacromial decompression and distal clavicle excision (Right); Eye surgery; Lumbar disc surgery (2016); Esophagogastroduodenoscopy; RIGHT/LEFT HEART CATH AND CORONARY ANGIOGRAPHY (N/A, 07/11/2016); Spine surgery; and Colonoscopy (02/14/2015). Family History: Patient family history includes Alcoholism in her father; Depression in her mother; Diabetes in her father; Glaucoma in her father; Heart disease in her father; High blood pressure in her father; Kidney failure in her brother; Obesity in her father and mother; Pancreatic cancer in her mother; Thyroid cancer in her father and mother. Social History:  Patient  reports that she quit smoking about 31 years ago. Her smoking use included cigarettes. She  has a 24.00 pack-year smoking history. She has never used smokeless tobacco. She reports that she does not drink alcohol and does not use drugs.  Review of Systems: Constitutional: negative for fever or malaise Ophthalmic: negative for photophobia, double vision or loss of vision Cardiovascular: negative for chest pain, dyspnea on exertion, or new LE swelling Respiratory: negative for SOB or persistent cough Gastrointestinal: negative for abdominal pain, change in bowel habits or melena Genitourinary: negative for dysuria or gross hematuria, no abnormal uterine bleeding or disharge Musculoskeletal: negative for new gait disturbance or muscular weakness Integumentary: negative for new or persistent rashes, no breast lumps Neurological: negative for TIA or stroke symptoms Psychiatric: negative for SI or delusions Allergic/Immunologic: negative for hives  Patient Care Team    Relationship Specialty Notifications Start End  Willow Ora, MD PCP - General Family Medicine  04/20/19   Marcene Corning, MD Consulting Physician Orthopedic Surgery  02/09/15   Barnett Abu, MD Consulting Physician Neurosurgery  02/09/15   Jethro Bolus, MD (Inactive) Consulting Physician Urology  02/09/15   [place], Peter M, MD Consulting Physician Cardiology  02/09/15   Wanita Chamberlain, MD  Dermatology  11/28/17   Silverio Lay, MD Consulting Physician Obstetrics and Gynecology  04/20/19   Iva Boop, MD Consulting Physician Gastroenterology  06/01/19   Marinus Maw, MD Consulting Physician Cardiology  07/29/19     Objective  Vitals: BP 130/80   Pulse 78   Temp 97.9 F (36.6 C) (Temporal)   Ht 5\' 3"  (1.6 m)   Wt 221 lb 6.4 oz (100.4 kg)   SpO2 100%   BMI 39.22 kg/m  General:  Well developed, well nourished, no acute distress  Psych:  Alert and orientedx3,normal mood and affect HEENT:  Normocephalic, atraumatic, non-icteric sclera,  supple neck without adenopathy, mass or  thyromegaly Cardiovascular:  Normal S1, S2, RRR without gallop, rub or murmur Respiratory:  Good breath sounds bilaterally, CTAB with normal respiratory effort Gastrointestinal: normal bowel sounds, soft, non-tender, no noted masses. No HSM MSK: no deformities, contusions. Joints are without erythema or swelling.  Skin:  Warm, no rashes or suspicious lesions noted   Commons side effects, risks, benefits, and alternatives for medications and treatment plan prescribed today were discussed, and the patient expressed understanding of the given instructions. Patient is instructed to call or message via MyChart if he/she has any questions or concerns regarding our treatment plan. No barriers to understanding were identified. We discussed Red Flag symptoms and signs in detail. Patient expressed understanding regarding what to do in case of urgent or emergency type symptoms.  Medication list was reconciled, printed and provided to the patient in AVS. Patient instructions and summary information was reviewed with the patient as documented in the AVS. This note was prepared with assistance of Dragon voice recognition software. Occasional  wrong-word or sound-a-like substitutions may have occurred due to the inherent limitations of voice recognition software  This visit occurred during the SARS-CoV-2 public health emergency.  Safety protocols were in place, including screening questions prior to the visit, additional usage of staff PPE, and extensive cleaning of exam room while observing appropriate contact time as indicated for disinfecting solutions.

## 2021-02-06 ENCOUNTER — Other Ambulatory Visit: Payer: Self-pay

## 2021-02-06 DIAGNOSIS — E039 Hypothyroidism, unspecified: Secondary | ICD-10-CM

## 2021-02-08 ENCOUNTER — Other Ambulatory Visit: Payer: Self-pay

## 2021-02-08 ENCOUNTER — Encounter: Payer: Self-pay | Admitting: Family Medicine

## 2021-02-08 DIAGNOSIS — E039 Hypothyroidism, unspecified: Secondary | ICD-10-CM

## 2021-02-08 NOTE — Telephone Encounter (Signed)
Spoke with patient, will bring her supplements to her lab visit on the 14th.

## 2021-02-13 ENCOUNTER — Ambulatory Visit (INDEPENDENT_AMBULATORY_CARE_PROVIDER_SITE_OTHER): Payer: Medicare Other | Admitting: Family Medicine

## 2021-02-13 ENCOUNTER — Other Ambulatory Visit: Payer: Self-pay

## 2021-02-13 ENCOUNTER — Encounter (INDEPENDENT_AMBULATORY_CARE_PROVIDER_SITE_OTHER): Payer: Self-pay | Admitting: Family Medicine

## 2021-02-13 VITALS — BP 120/77 | HR 76 | Temp 98.0°F | Ht 63.0 in | Wt 217.0 lb

## 2021-02-13 DIAGNOSIS — I5022 Chronic systolic (congestive) heart failure: Secondary | ICD-10-CM | POA: Diagnosis not present

## 2021-02-13 DIAGNOSIS — G4452 New daily persistent headache (NDPH): Secondary | ICD-10-CM

## 2021-02-13 DIAGNOSIS — Z6841 Body Mass Index (BMI) 40.0 and over, adult: Secondary | ICD-10-CM

## 2021-02-13 DIAGNOSIS — Z79899 Other long term (current) drug therapy: Secondary | ICD-10-CM

## 2021-02-13 DIAGNOSIS — R110 Nausea: Secondary | ICD-10-CM

## 2021-02-13 DIAGNOSIS — M3500 Sicca syndrome, unspecified: Secondary | ICD-10-CM

## 2021-02-14 NOTE — Progress Notes (Signed)
Chief Complaint:   OBESITY Kristine Curtis is here to discuss her progress with her obesity treatment plan along with follow-up of her obesity related diagnoses. See Medical Weight Management Flowsheet for complete bioelectrical impedance results.  Today's visit was #: 19 Starting weight: 226 lbs Starting date: 06/21/2019 Weight change since last visit: +2 lbs Total lbs lost to date: 9 lbs Total weight loss percentage to date: -3.98%  Nutrition Plan: Practicing portion control and making smarter food choices, such as increasing vegetables and decreasing simple carbohydrates for 100% of the time. Activity: None.  Interim History: Kristine Curtis says she is still having dizziness, nausea, and headaches.  Current Outpatient Medications:    albuterol (VENTOLIN HFA) 108 (90 Base) MCG/ACT inhaler, Inhale 2 puffs into the lungs every 6 (six) hours as needed for wheezing or shortness of breath., Disp: 8 g, Rfl: 3   buPROPion (WELLBUTRIN XL) 300 MG 24 hr tablet, TAKE 1 TABLET BY MOUTH DAILY, Disp: 30 tablet, Rfl: 11   carvedilol (COREG) 12.5 MG tablet, TAKE ONE TABLET TWICE DAILY WITH A MEAL, Disp: 180 tablet, Rfl: 3   Cholecalciferol (VITAMIN Curtis-3) 25 MCG (1000 UT) CAPS, Take 1,000 Units by mouth daily. , Disp: , Rfl:    diazepam (VALIUM) 5 MG tablet, Take 1 tablet (5 mg total) by mouth daily as needed for anxiety., Disp: 30 tablet, Rfl: 2   Fluticasone-Salmeterol (ADVAIR) 100-50 MCG/DOSE AEPB, Inhale 1 puff into the lungs 2 (two) times daily., Disp: 1 each, Rfl: 3   furosemide (LASIX) 20 MG tablet, Take 1 tablet (20 mg total) by mouth daily as needed for edema., Disp: 90 tablet, Rfl: 3   Galcanezumab-gnlm 120 MG/ML SOAJ, Inject 120 mg into the skin every 30 (thirty) days., Disp: , Rfl:    glucose blood (ONETOUCH ULTRA) test strip, Use to check blood sugar TID, Disp: 100 each, Rfl: 0   hydroxychloroquine (PLAQUENIL) 200 MG tablet, Take 2 tablets (400 mg total) by mouth every morning., Disp: 90 tablet, Rfl:  2   hydrOXYzine (ATARAX/VISTARIL) 25 MG tablet, Take 1 tablet (25 mg total) by mouth at bedtime as needed for itching (insomnia)., Disp: 180 tablet, Rfl: 1   JARDIANCE 10 MG TABS tablet, TAKE ONE TABLET EACH MORNING BEFORE BREAKFAST, Disp: 90 tablet, Rfl: 3   Lancets (ONETOUCH ULTRASOFT) lancets, Use to check blood sugar TID, Disp: 100 each, Rfl: 0   levothyroxine (SYNTHROID) 100 MCG tablet, TAKE ONE TABLET EVERY DAY, Disp: 90 tablet, Rfl: 3   losartan (COZAAR) 50 MG tablet, Take 1 tablet (50 mg total) by mouth daily., Disp: 30 tablet, Rfl: 3   nystatin-triamcinolone ointment (MYCOLOG), Apply 1 application topically 2 (two) times daily as needed., Disp: 60 g, Rfl: 2   omeprazole (PRILOSEC) 40 MG capsule, TAKE 1 CAPSULE EVERY DAY, Disp: 90 capsule, Rfl: 3   ondansetron (ZOFRAN ODT) 4 MG disintegrating tablet, Take 1 tablet (4 mg total) by mouth every 8 (eight) hours as needed for nausea or vomiting., Disp: 24 tablet, Rfl: 0   OVER THE COUNTER MEDICATION, Take 5 capsules by mouth daily. Kristine Curtis, Disp: , Rfl:    OVER THE COUNTER MEDICATION, Take 1 scoop by mouth daily. Kristine Curtis, Disp: , Rfl:    oxyCODONE-acetaminophen (PERCOCET) 10-325 MG tablet, Take 1 tablet by mouth daily as needed for pain., Disp: 30 tablet, Rfl: 0   rizatriptan (MAXALT) 10 MG tablet, Take 10 mg by mouth as needed for migraine. May repeat in 2 hours if needed,  Disp: , Rfl:    spironolactone (ALDACTONE) 25 MG tablet, TAKE ONE TABLET EVERY DAY, Disp: 90 tablet, Rfl: 3   topiramate (TOPAMAX) 25 MG tablet, Take 1 tablet (25 mg total) by mouth 2 (two) times daily., Disp: 60 tablet, Rfl: 3   Wheat Dextrin (BENEFIBER) POWD, Take 2 scoop by mouth daily as needed (for constipation). , Disp: , Rfl:   Assessment/Plan:   Diagnoses and all orders for this visit:  Sjogren's syndrome, with associated multiple joint and muscle pain, fatigue, and fibromyalgia Comments: Followed by Rheumatology, with next  appointment in December. Recently weaned off cyclobenzaprine and meloxicam. Not exercising or stretching regularly.  Chronic systolic congestive heart failure (HCC), 07/25/20 ECHO with normal EF Comments: Followed by Cardiology. We will continue to monitor symptoms as they relate to her weight loss journey.  Medication management Comments: Current anticholinergic medication: Hydroxyzine. Discussed stopping all supplements except for probiotic until current nausea is evaluated. Thyroid function being monitored by PCP with follow up labs this week.   Chronic nausea Comments: This "never goes away." It is associated with abdominal bloating, but no pain. She endorses daily, "easy" bowel movements. Water "makes her sick" so she is drinking juice to stay hydrated. DDx: medication related, gastroparesis, GERD, migraine. Treatment has included antiemetic. Consider prokinetic agent. Consider adding lipase to labs this week. Mom died from pancreatic cancer at age 32. GI physician is Dr. Leone Payor.   New persistent daily headache Comments: Started after weaning meloxicam and cyclobenzaprine. Followed by Neurology with an appointment on October 18.   Obesity BMI today 38.4  Course: Kristine Curtis is currently in the action stage of change. As such, her goal is to continue with weight loss efforts.   Nutrition goals: She has agreed to practicing portion control and making smarter food choices, such as increasing vegetables and decreasing simple carbohydrates.   Exercise goals:  Increase NEAT.  Behavioral modification strategies: increasing lean protein intake, decreasing simple carbohydrates, increasing vegetables, increasing water intake, and decreasing liquid calories.  Kristine Curtis has agreed to follow-up with our clinic in 2-3 weeks. She was informed of the importance of frequent follow-up visits to maximize her success with intensive lifestyle modifications for her multiple health conditions.   Objective:    Blood pressure 120/77, pulse 76, temperature 98 F (36.7 C), temperature source Oral, height 5\' 3"  (1.6 m), weight 217 lb (98.4 kg), SpO2 98 %. Body mass index is 38.44 kg/m.  General: Cooperative, alert, well developed, in no acute distress. HEENT: Conjunctivae and lids unremarkable. Cardiovascular: Regular rhythm.  Lungs: Normal work of breathing. Neurologic: No focal deficits.   Lab Results  Component Value Date   CREATININE 1.06 02/01/2021   BUN 15 02/01/2021   NA 141 02/01/2021   K 3.6 02/01/2021   CL 108 02/01/2021   CO2 25 02/01/2021   Lab Results  Component Value Date   ALT 31 02/01/2021   AST 28 02/01/2021   ALKPHOS 79 02/01/2021   BILITOT 0.6 02/01/2021   Lab Results  Component Value Date   HGBA1C 4.8 06/21/2019   HGBA1C 4.8 02/03/2019   HGBA1C 4.9 03/20/2016   HGBA1C 5.2 11/21/2014   Lab Results  Component Value Date   INSULIN 31.4 (H) 06/21/2019   Lab Results  Component Value Date   TSH 7.05 (H) 02/01/2021   Lab Results  Component Value Date   CHOL 164 02/01/2021   HDL 51.50 02/01/2021   LDLCALC 91 02/01/2021   LDLDIRECT 75.0 03/20/2016   TRIG 109.0 02/01/2021  CHOLHDL 3 02/01/2021   Lab Results  Component Value Date   VD25OH 49.7 06/21/2019   VD25OH 58.34 02/03/2019   VD25OH 45.39 03/20/2016   Lab Results  Component Value Date   WBC 3.8 (L) 02/01/2021   HGB 13.3 02/01/2021   HCT 40.2 02/01/2021   MCV 96.6 02/01/2021   PLT 173.0 02/01/2021   Lab Results  Component Value Date   IRON 41 (L) 11/17/2008   FERRITIN 25.1 11/17/2008   Attestation Statements:   Reviewed by clinician on day of visit: allergies, medications, problem list, medical history, surgical history, family history, social history, and previous encounter notes.  Time spent on visit including pre-visit chart review and post-visit care and charting was 60 minutes.   I, Insurance claims handler, CMA, am acting as transcriptionist for Helane Rima, DO  I have reviewed the  above documentation for accuracy and completeness, and I agree with the above. Helane Rima, DO

## 2021-02-16 ENCOUNTER — Other Ambulatory Visit: Payer: Self-pay

## 2021-02-16 ENCOUNTER — Other Ambulatory Visit (INDEPENDENT_AMBULATORY_CARE_PROVIDER_SITE_OTHER): Payer: Medicare Other

## 2021-02-16 ENCOUNTER — Encounter (INDEPENDENT_AMBULATORY_CARE_PROVIDER_SITE_OTHER): Payer: Self-pay | Admitting: Family Medicine

## 2021-02-16 ENCOUNTER — Encounter: Payer: Self-pay | Admitting: Family Medicine

## 2021-02-16 DIAGNOSIS — E039 Hypothyroidism, unspecified: Secondary | ICD-10-CM | POA: Diagnosis not present

## 2021-02-16 LAB — TSH: TSH: 2.11 u[IU]/mL (ref 0.35–5.50)

## 2021-02-16 LAB — VITAMIN B12: Vitamin B-12: 660 pg/mL (ref 211–911)

## 2021-02-16 NOTE — Telephone Encounter (Signed)
Patient brought in supplements as instructed by Dr.Andy  Prebiotic/Probiotic Gummy Vitamin D3 2,000U Zinc 50mg  Vinali Optimal-V Optimal-M Magnical-D [facility] Acetaminophen (as needed, states she uses this before she ever uses her migraine medication)

## 2021-02-19 NOTE — Telephone Encounter (Signed)
Dr.Wallace °

## 2021-02-20 ENCOUNTER — Encounter: Payer: Self-pay | Admitting: Family Medicine

## 2021-02-21 ENCOUNTER — Other Ambulatory Visit (INDEPENDENT_AMBULATORY_CARE_PROVIDER_SITE_OTHER): Payer: Self-pay | Admitting: Family Medicine

## 2021-02-21 ENCOUNTER — Other Ambulatory Visit: Payer: Self-pay | Admitting: Cardiology

## 2021-02-21 DIAGNOSIS — G8929 Other chronic pain: Secondary | ICD-10-CM

## 2021-02-21 NOTE — Telephone Encounter (Signed)
Dr.Wallace °

## 2021-02-27 ENCOUNTER — Other Ambulatory Visit (INDEPENDENT_AMBULATORY_CARE_PROVIDER_SITE_OTHER): Payer: Self-pay | Admitting: Family Medicine

## 2021-02-27 ENCOUNTER — Encounter (INDEPENDENT_AMBULATORY_CARE_PROVIDER_SITE_OTHER): Payer: Self-pay | Admitting: Family Medicine

## 2021-02-27 DIAGNOSIS — G8929 Other chronic pain: Secondary | ICD-10-CM

## 2021-02-27 NOTE — Telephone Encounter (Signed)
Last OV with Dr Wallace 

## 2021-03-08 ENCOUNTER — Encounter (INDEPENDENT_AMBULATORY_CARE_PROVIDER_SITE_OTHER): Payer: Self-pay | Admitting: Family Medicine

## 2021-03-08 ENCOUNTER — Other Ambulatory Visit: Payer: Self-pay

## 2021-03-08 ENCOUNTER — Ambulatory Visit (INDEPENDENT_AMBULATORY_CARE_PROVIDER_SITE_OTHER): Payer: Medicare Other | Admitting: Family Medicine

## 2021-03-08 VITALS — BP 121/78 | HR 77 | Temp 98.1°F | Ht 63.0 in | Wt 216.0 lb

## 2021-03-08 DIAGNOSIS — Z6841 Body Mass Index (BMI) 40.0 and over, adult: Secondary | ICD-10-CM

## 2021-03-08 DIAGNOSIS — I5022 Chronic systolic (congestive) heart failure: Secondary | ICD-10-CM | POA: Diagnosis not present

## 2021-03-08 DIAGNOSIS — E039 Hypothyroidism, unspecified: Secondary | ICD-10-CM

## 2021-03-08 DIAGNOSIS — M7918 Myalgia, other site: Secondary | ICD-10-CM

## 2021-03-08 DIAGNOSIS — R110 Nausea: Secondary | ICD-10-CM | POA: Diagnosis not present

## 2021-03-08 MED ORDER — BACLOFEN 10 MG PO TABS: 10.0000 mg | ORAL_TABLET | Freq: Three times a day (TID) | ORAL | 0 refills | Status: DC | PRN

## 2021-03-08 MED ORDER — PREDNISONE 10 MG PO TABS: 10.0000 mg | ORAL_TABLET | Freq: Every day | ORAL | 0 refills | Status: DC | PRN

## 2021-03-09 LAB — COMPREHENSIVE METABOLIC PANEL
ALT: 26 IU/L (ref 0–32)
AST: 23 IU/L (ref 0–40)
Albumin/Globulin Ratio: 2 (ref 1.2–2.2)
Albumin: 4.3 g/dL (ref 3.8–4.8)
Alkaline Phosphatase: 116 IU/L (ref 44–121)
BUN/Creatinine Ratio: 13 (ref 12–28)
BUN: 15 mg/dL (ref 8–27)
Bilirubin Total: 0.4 mg/dL (ref 0.0–1.2)
CO2: 23 mmol/L (ref 20–29)
Calcium: 9.5 mg/dL (ref 8.7–10.3)
Chloride: 105 mmol/L (ref 96–106)
Creatinine, Ser: 1.13 mg/dL — ABNORMAL HIGH (ref 0.57–1.00)
Globulin, Total: 2.1 g/dL (ref 1.5–4.5)
Glucose: 102 mg/dL — ABNORMAL HIGH (ref 70–99)
Potassium: 4.5 mmol/L (ref 3.5–5.2)
Sodium: 142 mmol/L (ref 134–144)
Total Protein: 6.4 g/dL (ref 6.0–8.5)
eGFR: 55 mL/min/{1.73_m2} — ABNORMAL LOW (ref >59)

## 2021-03-09 LAB — LIPASE: Lipase: 37 U/L (ref 14–72)

## 2021-03-12 NOTE — Progress Notes (Signed)
Chief Complaint:   OBESITY Kristine Curtis is here to discuss her progress with her obesity treatment plan along with follow-up of her obesity related diagnoses. See Medical Weight Management Flowsheet for complete bioelectrical impedance results.  Today's visit was #: 20 Starting weight: 226 lbs Starting date: 06/21/2019 Weight change since last visit: 1 lb Total lbs lost to date: 10 lbs Total weight loss percentage to date: -4.42%  Nutrition Plan: Practicing portion control and making smarter food choices, such as increasing vegetables and decreasing simple carbohydrates for 100% of the time. Activity: Increased activity/stretching.  Assessment/Plan:   1. Myofascial pain dysfunction syndrome Followed by Rheumatology.  Upcoming appointment in December. Her pain has continued to worsen since stopping Flexeril and NSAIDs. This has inhibited movement, increasing depressive symptoms and hindering weight management attempts. After discussion, patient would like to start below medication. Expectations, risks, and potential side effects reviewed.   Plan:  Start baclofen 10 mg three times daily as needed for muscle spasms and prednisone 10 mg daily.  - Start baclofen (LIORESAL) 10 MG tablet; Take 1 tablet (10 mg total) by mouth 3 (three) times daily as needed for muscle spasms.  Dispense: 30 each; Refill: 0 - Start predniSONE (DELTASONE) 10 MG tablet; Take 1 tablet (10 mg total) by mouth daily as needed (inflammation).  Dispense: 21 tablet; Refill: 0 - NOTE: Instructions were for short-term burst or taper for pain flare.   2. Chronic nausea She reports that this "never goes away".  She is being followed by Dr. Leone Payor in GI.  Plan:  Will check CMP and lipase today.   - Comprehensive metabolic panel - Lipase  3. Chronic systolic congestive heart failure (HCC), 07/25/20 ECHO with normal EF Followed by Cardiology. We will continue to monitor symptoms as they relate to her weight loss  journey.  4. Acquired hypothyroidism Course: Stable. Medication: Synthroid 100 mcg daily.   Plan: Patient was instructed not to take MVM or iron within 4 hours of taking thyroid medications. This issue is managed by Dr. Mardelle Matte. We will continue to monitor alongside PCP as it relates to her weight loss journey.   Lab Results  Component Value Date   TSH 2.11 02/16/2021   5. Obesity BMI today 38.4  Course: Kristine Curtis is currently in the action stage of change. As such, her goal is to continue with weight loss efforts.   Nutrition goals: She has agreed to practicing portion control and making smarter food choices, such as increasing vegetables and decreasing simple carbohydrates.   Exercise goals:  As is.  Behavioral modification strategies: increasing lean protein intake, decreasing simple carbohydrates, decreasing liquid calories, and decreasing sodium intake.  Kristine Curtis has agreed to follow-up with our clinic in 2-3 weeks. She was informed of the importance of frequent follow-up visits to maximize her success with intensive lifestyle modifications for her multiple health conditions.   Kristine Curtis was informed we would discuss her lab results at her next visit unless there is a critical issue that needs to be addressed sooner. Kristine Curtis agreed to keep her next visit at the agreed upon time to discuss these results.  Objective:   Blood pressure 121/78, pulse 77, temperature 98.1 F (36.7 C), temperature source Oral, height 5\' 3"  (1.6 m), weight 216 lb (98 kg), SpO2 98 %. Body mass index is 38.26 kg/m.  General: Cooperative, alert, well developed, in no acute distress. HEENT: Conjunctivae and lids unremarkable. Cardiovascular: Regular rhythm.  Lungs: Normal work of breathing. Neurologic: No focal deficits.  Lab Results  Component Value Date   CREATININE 1.13 (H) 03/08/2021   BUN 15 03/08/2021   NA 142 03/08/2021   K 4.5 03/08/2021   CL 105 03/08/2021   CO2 23 03/08/2021   Lab Results   Component Value Date   ALT 26 03/08/2021   AST 23 03/08/2021   ALKPHOS 116 03/08/2021   BILITOT 0.4 03/08/2021   Lab Results  Component Value Date   HGBA1C 4.8 06/21/2019   HGBA1C 4.8 02/03/2019   HGBA1C 4.9 03/20/2016   HGBA1C 5.2 11/21/2014   Lab Results  Component Value Date   INSULIN 31.4 (H) 06/21/2019   Lab Results  Component Value Date   TSH 2.11 02/16/2021   Lab Results  Component Value Date   CHOL 164 02/01/2021   HDL 51.50 02/01/2021   LDLCALC 91 02/01/2021   LDLDIRECT 75.0 03/20/2016   TRIG 109.0 02/01/2021   CHOLHDL 3 02/01/2021   Lab Results  Component Value Date   VD25OH 49.7 06/21/2019   VD25OH 58.34 02/03/2019   VD25OH 45.39 03/20/2016   Lab Results  Component Value Date   WBC 3.8 (L) 02/01/2021   HGB 13.3 02/01/2021   HCT 40.2 02/01/2021   MCV 96.6 02/01/2021   PLT 173.0 02/01/2021   Lab Results  Component Value Date   IRON 41 (L) 11/17/2008   FERRITIN 25.1 11/17/2008   Attestation Statements:   Reviewed by clinician on day of visit: allergies, medications, problem list, medical history, surgical history, family history, social history, and previous encounter notes.  Time spent on visit including pre-visit chart review and post-visit care and documentation was 48 minutes. Time was spent on: Food choices and timing of food intake reviewed today. I performed a medically necessary appropriate examination and/or evaluation. I discussed the assessment and treatment plan with the patient. Motivational interviewing as well as evidence-based interventions for health behavior change were utilized today including the discussion of self monitoring techniques, problem-solving barriers and SMART goal setting techniques.  An exercise prescription was reviewed.  The patient was provided an opportunity to ask questions and all were answered. The patient agreed with the plan and demonstrated an understanding of the instructions. Clinical information was updated  and documented in the EMR.  I, Insurance claims handler, CMA, am acting as transcriptionist for Helane Rima, DO  I have reviewed the above documentation for accuracy and completeness, and I agree with the above. -  Helane Rima, DO, MS, FAAFP, DABOM - Family and Bariatric Medicine.

## 2021-03-14 ENCOUNTER — Encounter (INDEPENDENT_AMBULATORY_CARE_PROVIDER_SITE_OTHER): Payer: Self-pay | Admitting: Family Medicine

## 2021-03-23 ENCOUNTER — Other Ambulatory Visit: Payer: Self-pay | Admitting: Family Medicine

## 2021-03-23 DIAGNOSIS — K219 Gastro-esophageal reflux disease without esophagitis: Secondary | ICD-10-CM

## 2021-04-09 ENCOUNTER — Encounter (INDEPENDENT_AMBULATORY_CARE_PROVIDER_SITE_OTHER): Payer: Self-pay | Admitting: Family Medicine

## 2021-04-09 ENCOUNTER — Other Ambulatory Visit: Payer: Self-pay

## 2021-04-09 ENCOUNTER — Ambulatory Visit (INDEPENDENT_AMBULATORY_CARE_PROVIDER_SITE_OTHER): Payer: Medicare Other | Admitting: Family Medicine

## 2021-04-09 VITALS — BP 131/77 | HR 79 | Temp 98.4°F | Ht 63.0 in | Wt 218.0 lb

## 2021-04-09 DIAGNOSIS — R110 Nausea: Secondary | ICD-10-CM

## 2021-04-09 DIAGNOSIS — Z6841 Body Mass Index (BMI) 40.0 and over, adult: Secondary | ICD-10-CM

## 2021-04-09 DIAGNOSIS — M7918 Myalgia, other site: Secondary | ICD-10-CM | POA: Diagnosis not present

## 2021-04-09 DIAGNOSIS — E66813 Obesity, class 3: Secondary | ICD-10-CM

## 2021-04-09 DIAGNOSIS — K219 Gastro-esophageal reflux disease without esophagitis: Secondary | ICD-10-CM

## 2021-04-10 ENCOUNTER — Other Ambulatory Visit: Payer: Self-pay | Admitting: Family Medicine

## 2021-04-10 ENCOUNTER — Encounter: Payer: Self-pay | Admitting: Family Medicine

## 2021-04-10 DIAGNOSIS — M797 Fibromyalgia: Secondary | ICD-10-CM

## 2021-04-10 NOTE — Progress Notes (Signed)
Chief Complaint:   OBESITY Kristine Curtis is here to discuss her progress with her obesity treatment plan along with follow-up of her obesity related diagnoses. See Medical Weight Management Flowsheet for complete bioelectrical impedance results.  Today's visit was #: 21 Starting weight: 226 lbs Starting date: 06/21/2019 Weight change since last visit: +2 lbs Total lbs lost to date: 8 lbs Total weight loss percentage to date: -3.54%  Nutrition Plan: Practicing portion control and making smarter food choices, such as increasing vegetables and decreasing simple carbohydrates for 100% of the time. Activity: Increased activity.  Interim History: Kristine Curtis was exercising daily - aerobic movement for 30 minutes.  She says it, "made me feel so good"!  No more whole day nausea, but still some episodes she describes as her being weak, vertigo-like.  She will take something (Phenergan) and go to sleep.  Episode will be gone within one hour.  She says she is still working on hydration.  She endorses reflux (spoke with dentist).  She says that prednisone was very helpful to calm myofascial pain.  She could not tell a difference with baclofen.  She is now taking a daily antiinflammatory and is using a mouth guard.  She sees Rheumatology next week.  Assessment/Plan:   1. Myofascial pain dysfunction syndrome Kristine Curtis could not tell a difference with baclofen, but says that prednisone was helpful.  She will see Rheumatology next week.  2. Gastroesophageal reflux disease, unspecified whether esophagitis present With odynophagia.  She is taking Prilosec 40 mg daily.  Plan:  Increase Prilosec to 40 mg twice daily.  She will call Dr. Leone Payor.  We reviewed the diagnosis of GERD and importance of treatment. We discussed "red flag" symptoms and the importance of follow up if symptoms persisted despite treatment. We reviewed non-pharmacologic management of GERD symptoms: including: caffeine reduction, dietary  changes, elevate HOB, NPO after supper, reduction of alcohol intake, tobacco cessation, and weight loss.  3. Nausea, improving Nausea no longer lasts for an entire day.  Episodes last for about an hour and Phenergan is helpful.  4. Obesity BMI today 38.6  Course: Kristine Curtis is currently in the action stage of change. As such, her goal is to continue with weight loss efforts.   Nutrition goals: She has agreed to practicing portion control and making smarter food choices, such as increasing vegetables and decreasing simple carbohydrates.   Exercise goals:  As is.  Behavioral modification strategies: increasing lean protein intake, decreasing simple carbohydrates, increasing vegetables, increasing water intake, and emotional eating strategies.  Kristine Curtis has agreed to follow-up with our clinic in 4 weeks. She was informed of the importance of frequent follow-up visits to maximize her success with intensive lifestyle modifications for her multiple health conditions.   Objective:   Blood pressure 131/77, pulse 79, temperature 98.4 F (36.9 C), temperature source Oral, height 5\' 3"  (1.6 m), weight 218 lb (98.9 kg), SpO2 99 %. Body mass index is 38.62 kg/m.  General: Cooperative, alert, well developed, in no acute distress. HEENT: Conjunctivae and lids unremarkable. Cardiovascular: Regular rhythm.  Lungs: Normal work of breathing. Neurologic: No focal deficits.   Lab Results  Component Value Date   CREATININE 1.13 (H) 03/08/2021   BUN 15 03/08/2021   NA 142 03/08/2021   K 4.5 03/08/2021   CL 105 03/08/2021   CO2 23 03/08/2021   Lab Results  Component Value Date   ALT 26 03/08/2021   AST 23 03/08/2021   ALKPHOS 116 03/08/2021   BILITOT 0.4  03/08/2021   Lab Results  Component Value Date   HGBA1C 4.8 06/21/2019   HGBA1C 4.8 02/03/2019   HGBA1C 4.9 03/20/2016   HGBA1C 5.2 11/21/2014   Lab Results  Component Value Date   INSULIN 31.4 (H) 06/21/2019   Lab Results  Component  Value Date   TSH 2.11 02/16/2021   Lab Results  Component Value Date   CHOL 164 02/01/2021   HDL 51.50 02/01/2021   LDLCALC 91 02/01/2021   LDLDIRECT 75.0 03/20/2016   TRIG 109.0 02/01/2021   CHOLHDL 3 02/01/2021   Lab Results  Component Value Date   VD25OH 49.7 06/21/2019   VD25OH 58.34 02/03/2019   VD25OH 45.39 03/20/2016   Lab Results  Component Value Date   WBC 3.8 (L) 02/01/2021   HGB 13.3 02/01/2021   HCT 40.2 02/01/2021   MCV 96.6 02/01/2021   PLT 173.0 02/01/2021   Lab Results  Component Value Date   IRON 41 (L) 11/17/2008   FERRITIN 25.1 11/17/2008   Attestation Statements:   Reviewed by clinician on day of visit: allergies, medications, problem list, medical history, surgical history, family history, social history, and previous encounter notes.  I, Insurance claims handler, CMA, am acting as transcriptionist for Helane Rima, DO  I have reviewed the above documentation for accuracy and completeness, and I agree with the above. -  Helane Rima, DO, MS, FAAFP, DABOM - Family and Bariatric Medicine.

## 2021-04-11 ENCOUNTER — Telehealth: Payer: Self-pay

## 2021-04-11 ENCOUNTER — Other Ambulatory Visit: Payer: Self-pay

## 2021-04-11 DIAGNOSIS — M797 Fibromyalgia: Secondary | ICD-10-CM

## 2021-04-11 MED ORDER — OXYCODONE-ACETAMINOPHEN 10-325 MG PO TABS: 1.0000 | ORAL_TABLET | Freq: Every day | ORAL | 0 refills | Status: DC | PRN

## 2021-04-11 NOTE — Telephone Encounter (Signed)
Last Refill 02/01/2021 Last OV 02/01/2021 dx annual physical

## 2021-04-11 NOTE — Telephone Encounter (Signed)
See note

## 2021-04-11 NOTE — Telephone Encounter (Signed)
Pt called stating that she needs a refill for Oxycodone. Pt stated that it was denied and she would like a call to discuss why the prescription was denied. Please Advise

## 2021-04-19 ENCOUNTER — Telehealth: Payer: Self-pay | Admitting: Internal Medicine

## 2021-04-19 NOTE — Telephone Encounter (Signed)
Inbound call from patient. States she is having severe nausea. States zofran does not help at all.

## 2021-04-19 NOTE — Telephone Encounter (Signed)
Pt states that she has been having nausea for five months. Pt stated that originally associated it with going off of medications that she has been on for a very long time ( Mobic and another medication) Pt states  that her dentist thought it to be gastric reflux due to the fact that  she was having  "Hot Spots"  in her mouth.  Pt stated that her Dr. instructed her to take the omeprazole twice a day recently instead of once a day although pt is still having nausea. Pt appointment that was scheduled for Jan 10th moved to 12/20.2022 at 11:00 AM with Doug Sou PA. Pt made aware. Pt verbalized understanding with all questions answered.

## 2021-04-22 ENCOUNTER — Other Ambulatory Visit: Payer: Self-pay | Admitting: Cardiology

## 2021-04-22 ENCOUNTER — Other Ambulatory Visit: Payer: Self-pay | Admitting: Family Medicine

## 2021-04-23 ENCOUNTER — Other Ambulatory Visit (INDEPENDENT_AMBULATORY_CARE_PROVIDER_SITE_OTHER): Payer: Self-pay | Admitting: Family Medicine

## 2021-04-23 DIAGNOSIS — G8929 Other chronic pain: Secondary | ICD-10-CM

## 2021-04-23 NOTE — Telephone Encounter (Signed)
Dr.Wallace °

## 2021-04-24 ENCOUNTER — Ambulatory Visit: Payer: Medicare Other | Admitting: Gastroenterology

## 2021-04-24 ENCOUNTER — Encounter: Payer: Self-pay | Admitting: Gastroenterology

## 2021-04-24 ENCOUNTER — Ambulatory Visit (INDEPENDENT_AMBULATORY_CARE_PROVIDER_SITE_OTHER): Payer: Medicare Other | Admitting: Gastroenterology

## 2021-04-24 VITALS — BP 108/76 | HR 90 | Ht 63.0 in | Wt 219.0 lb

## 2021-04-24 DIAGNOSIS — R1013 Epigastric pain: Secondary | ICD-10-CM | POA: Insufficient documentation

## 2021-04-24 DIAGNOSIS — K219 Gastro-esophageal reflux disease without esophagitis: Secondary | ICD-10-CM | POA: Diagnosis not present

## 2021-04-24 DIAGNOSIS — R110 Nausea: Secondary | ICD-10-CM | POA: Insufficient documentation

## 2021-04-24 MED ORDER — METOCLOPRAMIDE HCL 5 MG PO TABS: 5.0000 mg | ORAL_TABLET | Freq: Four times a day (QID) | ORAL | 0 refills | Status: DC | PRN

## 2021-04-24 NOTE — Patient Instructions (Signed)
We have sent the following medications to your pharmacy for you to pick up at your convenience: Reglan 5 mg every 6 hours as needed.  You have been scheduled for an endoscopy. Please follow written instructions given to you at your visit today. If you use inhalers (even only as needed), please bring them with you on the day of your procedure.  If you are age 61 or older, your body mass index should be between 23-30. Your Body mass index is 38.79 kg/m. If this is out of the aforementioned range listed, please consider follow up with your Primary Care Provider.  If you are age 83 or younger, your body mass index should be between 19-25. Your Body mass index is 38.79 kg/m. If this is out of the aformentioned range listed, please consider follow up with your Primary Care Provider.   ________________________________________________________  The [facility] providers would like to encourage you to use [facility] to communicate with providers for non-urgent requests or questions.  Due to long hold times on the telephone, sending your provider a message by [facility] may be a faster and more efficient way to get a response.  Please allow 48 business hours for a response.  Please remember that this is for non-urgent requests.  _______________________________________________________

## 2021-04-24 NOTE — Progress Notes (Signed)
04/24/2021 Kristine Curtis [id number] 09/18/1960   HISTORY OF PRESENT ILLNESS:  This is a 60 year old female who is a patient of Dr. Celesta Aver.  She is here today with complaints of severe nausea, GERD, upper abdominal pain for the past several months.  She tells me that this all started in July.  She says that she came off of a combination of Mobic and a muscle relaxant that she had been on for years.  She says that she tapered off of it, but believes that some of the symptoms of nausea etc. were due to rebound as she read that this symptoms could last for months.  Anyway, she has continued to have the symptoms.  She tells me that her dentist told her she had a lot of irritation in the back of her throat that he thought could be from reflux.  She has been on omeprazole 40 mg daily, but about 2 weeks ago increased it to twice a day.  She has noticed some improvement in her symptoms over the past week.  She has tried Zofran for the nausea, but that does not seem to help.  She has had no vomiting.  She reports pain in her epigastrium and mid to upper abdomen.  She says that she used to take handfuls of pills at a time, but recently has been having to take them only 1 or 2 at a time or else they get lodged in her throat.  She says she has not had any issues with food getting stuck.   EGD 02/2015:  ENDOSCOPIC IMPRESSION: 1) Ring at the GE junction dilated 18,19,20 mm w/ balloon 2) Small patch columnar mucosa in distal esophagus - biopsied - ? Barrett's 3) Otherwise normal  Surgical [P], distal esophagus - GASTROESOPHAGEAL JUNCTION MUCOSA WITH MILD INFLAMMATION CONSISTENT WITH GASTROESOPHAGEAL REFLUX. - THERE IS NO EVIDENCE OF GOBLET CELL METAPLASIA, DYSPLASIA, OR MALIGNANCY.  Past Medical History:  Diagnosis Date   Allergic rhinitis due to pollen    Anxiety    Asthma    Carpal tunnel syndrome of right wrist 12/22/2017   Chronic combined systolic and diastolic CHF (congestive heart  failure) ([place])    06/2016 Echo: EF 40-45%, Gr1 DD   Chronic fatigue    Depression    Family history of polycystic kidney with negative CT 11/16/2012   Fibromyalgia    GERD (gastroesophageal reflux disease)    Hypertension    Hypothyroidism    IBS (irritable bowel syndrome)    Internal hemorrhoids    Lymphocytic colitis    Morbid obesity ([place])    NICM (nonischemic cardiomyopathy) ([place])    a. 1999 Cath: nl cors;  b. EF prev as low as 35%;  c. 11/2015 Echo: Ef 40-45%;  d. 06/2016 Echo: EF 40-45%;  e. Lexiscan MV: fixed anterosepta/inferseptal defect w/o ischemia, EF 35%.   Osteoarthritis    PONV (postoperative nausea and vomiting)    PVC (premature ventricular contraction)    Restless leg syndrome    Sjogren's syndrome ([place])    Vertigo    Past Surgical History:  Procedure Laterality Date   ABDOMINAL HYSTERECTOMY     CESAREAN SECTION     CHOLECYSTECTOMY  10-2008   COLONOSCOPY  02/14/2015   ESOPHAGOGASTRODUODENOSCOPY     EYE SURGERY     2 2013 and 1981/DCR of right eye 05/2014   LUMBAR DISC SURGERY  2016   L3-4   RIGHT/LEFT HEART CATH AND CORONARY ANGIOGRAPHY N/A 07/11/2016  Procedure: Right/Left Heart Cath and Coronary Angiography;  Surgeon: Peter M Martinique, MD;  Location: [facility];  Service: Cardiovascular;  Laterality: N/A;   SHOULDER ARTHROSCOPY W/ SUBACROMIAL DECOMPRESSION AND DISTAL CLAVICLE EXCISION Right    SPINE SURGERY     C6-C7, 03/2017   TUBAL LIGATION  1992    reports that she quit smoking about 31 years ago. Her smoking use included cigarettes. She has a 24.00 pack-year smoking history. She has never used smokeless tobacco. She reports that she does not drink alcohol and does not use drugs. family history includes Alcoholism in her father; Depression in her mother; Diabetes in her father; Glaucoma in her father; Heart disease in her father; High blood pressure in her father; Kidney failure in her brother; Obesity in her father and mother; Pancreatic cancer in her  mother; Thyroid cancer in her father and mother. Allergies  Allergen Reactions   Gabapentin (Once-Daily)     Depression    Saxenda [Liraglutide -Weight Management] Other (See Comments)    Depression    Sulfa Antibiotics     itching   Trintellix [Vortioxetine] Other (See Comments)    GI issues    Adhesive [Tape] Rash   Codeine Itching      Outpatient Encounter Medications as of 04/24/2021  Medication Sig   albuterol (VENTOLIN HFA) 108 (90 Base) MCG/ACT inhaler Inhale 2 puffs into the lungs every 6 (six) hours as needed for wheezing or shortness of breath.   aspirin-acetaminophen-caffeine (HEADACHE RELIEF) 250-250-65 MG tablet Take by mouth every 6 (six) hours as needed for headache.   Bacillus Coagulans-Inulin (PROBIOTIC-PREBIOTIC) 1-250 BILLION-MG CAPS Take by mouth.   buPROPion (WELLBUTRIN XL) 300 MG 24 hr tablet TAKE 1 TABLET BY MOUTH DAILY   carvedilol (COREG) 12.5 MG tablet TAKE ONE TABLET TWICE A DAY WITH MEALS   Cholecalciferol (VITAMIN D-3 PO) Take 2,000 Units by mouth daily.   Dextrose-Fructose-Sod Citrate (NAUZENE) [phone] MG CHEW Chew by mouth.   diazepam (VALIUM) 5 MG tablet Take 1 tablet (5 mg total) by mouth daily as needed for anxiety.   Fluticasone-Salmeterol (ADVAIR) 100-50 MCG/DOSE AEPB Inhale 1 puff into the lungs 2 (two) times daily.   furosemide (LASIX) 20 MG tablet Take 1 tablet (20 mg total) by mouth daily as needed for edema.   Galcanezumab-gnlm 120 MG/ML SOAJ Inject 120 mg into the skin every 30 (thirty) days.   glucose blood (ONETOUCH ULTRA) test strip Use to check blood sugar TID   hydroxychloroquine (PLAQUENIL) 200 MG tablet Take 2 tablets (400 mg total) by mouth every morning.   JARDIANCE 10 MG TABS tablet TAKE ONE TABLET EACH MORNING BEFORE BREAKFAST   Lancets (ONETOUCH ULTRASOFT) lancets Use to check blood sugar TID   levothyroxine (SYNTHROID) 100 MCG tablet TAKE ONE TABLET EVERY DAY   losartan (COZAAR) 50 MG tablet TAKE 1 TABLET BY MOUTH DAILY.    Meclizine HCl 25 MG CHEW Chew by mouth daily as needed.   nystatin-triamcinolone ointment (MYCOLOG) Apply 1 application topically 2 (two) times daily as needed.   omeprazole (PRILOSEC) 40 MG capsule TAKE 1 CAPSULE EVERY DAY (Patient taking differently: in the morning and at bedtime.)   ondansetron (ZOFRAN ODT) 4 MG disintegrating tablet Take 1 tablet (4 mg total) by mouth every 8 (eight) hours as needed for nausea or vomiting.   OVER THE COUNTER MEDICATION Take 5 capsules by mouth daily. Ariix Optimals Vitamin & Minerals   OVER THE COUNTER MEDICATION Take 1 scoop by mouth daily. Ariix Magnecal D  oxyCODONE-acetaminophen (PERCOCET) 10-325 MG tablet Take 1 tablet by mouth daily as needed for pain.   rizatriptan (MAXALT) 10 MG tablet Take 10 mg by mouth as needed for migraine. May repeat in 2 hours if needed   spironolactone (ALDACTONE) 25 MG tablet TAKE ONE TABLET EVERY DAY   topiramate (TOPAMAX) 25 MG tablet Take 1 tablet (25 mg total) by mouth 2 (two) times daily.   UNABLE TO FIND Med Name: Nutrifil: Optimal-V, Vinali, Optimal-M, Magnical-D   Wheat Dextrin (BENEFIBER) POWD Take 2 scoop by mouth daily as needed (for constipation).    zinc gluconate 50 MG tablet Take 50 mg by mouth daily.   [DISCONTINUED] baclofen (LIORESAL) 10 MG tablet Take 1 tablet (10 mg total) by mouth 3 (three) times daily as needed for muscle spasms.   [DISCONTINUED] hydrOXYzine (ATARAX/VISTARIL) 25 MG tablet Take 1 tablet (25 mg total) by mouth at bedtime as needed for itching (insomnia).   [DISCONTINUED] predniSONE (DELTASONE) 10 MG tablet Take 1 tablet (10 mg total) by mouth daily as needed (inflammation).   No facility-administered encounter medications on file as of 04/24/2021.     REVIEW OF SYSTEMS  : All other systems reviewed and negative except where noted in the History of Present Illness.   PHYSICAL EXAM: BP 108/76    Pulse 90    Ht 5\' 3"  (1.6 m)    Wt 219 lb (99.3 kg)    SpO2 99%    BMI 38.79 kg/m   General: Well developed white female in no acute distress Head: Normocephalic and atraumatic Eyes:  Sclerae anicteric, conjunctiva pink. Ears: Normal auditory acuity Lungs: Clear throughout to auscultation; no W/R/R. Heart: Regular rate and rhythm; no M/R/G. Abdomen: Soft, non-distended.  BS present.  Mild epigastric and mid-abdominal TTP. Musculoskeletal: Symmetrical with no gross deformities  Skin: No lesions on visible extremities Extremities: No edema  Neurological: Alert oriented x 4, grossly non-focal Psychological:  Alert and cooperative. Normal mood and affect  ASSESSMENT AND PLAN: *GERD, nausea, epigastric pain: Has been present for the past 5 months or so.  Feels slightly better over the past week after increasing her PPI to twice a day a couple weeks ago.  Still with severe nausea.  Reports irritation in the back of her throat that her dentist thinks might be from reflux.  Continue on her twice daily PPI.  Zofran is not helping so we will give her some Reglan to use as needed.  Prescription sent to pharmacy.  We will plan for EGD with Dr. .  The risks, benefits, and alternatives to EGD were discussed with the patient and she consents to proceed  CC:  Leone Payor, MD

## 2021-04-26 ENCOUNTER — Ambulatory Visit (INDEPENDENT_AMBULATORY_CARE_PROVIDER_SITE_OTHER): Payer: Medicare Other | Admitting: Family Medicine

## 2021-05-03 ENCOUNTER — Ambulatory Visit (INDEPENDENT_AMBULATORY_CARE_PROVIDER_SITE_OTHER): Payer: Medicare Other | Admitting: Family Medicine

## 2021-05-08 ENCOUNTER — Ambulatory Visit: Payer: Medicare Other | Admitting: Cardiology

## 2021-05-09 ENCOUNTER — Other Ambulatory Visit: Payer: Self-pay

## 2021-05-09 ENCOUNTER — Encounter: Payer: Self-pay | Admitting: Internal Medicine

## 2021-05-09 ENCOUNTER — Ambulatory Visit (AMBULATORY_SURGERY_CENTER): Payer: Medicare Other | Admitting: Internal Medicine

## 2021-05-09 VITALS — BP 110/76 | HR 70 | Temp 97.7°F | Resp 11 | Ht 63.0 in | Wt 219.0 lb

## 2021-05-09 DIAGNOSIS — B9681 Helicobacter pylori [H. pylori] as the cause of diseases classified elsewhere: Secondary | ICD-10-CM

## 2021-05-09 DIAGNOSIS — K2970 Gastritis, unspecified, without bleeding: Secondary | ICD-10-CM | POA: Diagnosis not present

## 2021-05-09 DIAGNOSIS — R12 Heartburn: Secondary | ICD-10-CM | POA: Diagnosis not present

## 2021-05-09 DIAGNOSIS — R110 Nausea: Secondary | ICD-10-CM | POA: Diagnosis not present

## 2021-05-09 DIAGNOSIS — K2950 Unspecified chronic gastritis without bleeding: Secondary | ICD-10-CM | POA: Diagnosis not present

## 2021-05-09 MED ORDER — SODIUM CHLORIDE 0.9 % IV SOLN: 500.0000 mL | Freq: Once | INTRAVENOUS | Status: DC

## 2021-05-09 MED ORDER — METOCLOPRAMIDE HCL 5 MG PO TABS: 5.0000 mg | ORAL_TABLET | Freq: Four times a day (QID) | ORAL | 0 refills | Status: AC | PRN

## 2021-05-09 NOTE — Progress Notes (Signed)
Called to room to assist during endoscopic procedure.  Patient ID and intended procedure confirmed with present staff. Received instructions for my participation in the procedure from the performing physician.  

## 2021-05-09 NOTE — Progress Notes (Signed)
VS completed by DT.  

## 2021-05-09 NOTE — Patient Instructions (Addendum)
The esophagus looks great but the stomach looks inflamed. I took biopsies to see what that tells [place].  I refilled the metaclopramide (Reglan).  I will be in touch.  I appreciate the opportunity to care for you. Iva Boop, MD, FACG   YOU HAD AN ENDOSCOPIC PROCEDURE TODAY AT THE [facility]:   Refer to the procedure report that was given to you for any specific questions about what was found during the examination.  If the procedure report does not answer your questions, please call your gastroenterologist to clarify.  If you requested that your care partner not be given the details of your procedure findings, then the procedure report has been included in a sealed envelope for you to review at your convenience later.  YOU SHOULD EXPECT: Some feelings of bloating in the abdomen. Passage of more gas than usual.  Walking can help get rid of the air that was put into your GI tract during the procedure and reduce the bloating. If you had a lower endoscopy (such as a colonoscopy or flexible sigmoidoscopy) you may notice spotting of blood in your stool or on the toilet paper. If you underwent a bowel prep for your procedure, you may not have a normal bowel movement for a few days.  Please Note:  You might notice some irritation and congestion in your nose or some drainage.  This is from the oxygen used during your procedure.  There is no need for concern and it should clear up in a day or so.  SYMPTOMS TO REPORT IMMEDIATELY:   Following upper endoscopy (EGD)  Vomiting of blood or coffee ground material  New chest pain or pain under the shoulder blades  Painful or persistently difficult swallowing  New shortness of breath  Fever of 100F or higher  Black, tarry-looking stools  For urgent or emergent issues, a gastroenterologist can be reached at any hour by calling [phone]. Do not use MyChart messaging for urgent concerns.    DIET:  We do recommend a small meal at  first, but then you may proceed to your regular diet.  Drink plenty of fluids but you should avoid alcoholic beverages for 24 hours.  ACTIVITY:  You should plan to take it easy for the rest of today and you should NOT DRIVE or use heavy machinery until tomorrow (because of the sedation medicines used during the test).    FOLLOW UP: Our staff will call the number listed on your records 48-72 hours following your procedure to check on you and address any questions or concerns that you may have regarding the information given to you following your procedure. If we do not reach you, we will leave a message.  We will attempt to reach you two times.  During this call, we will ask if you have developed any symptoms of COVID 19. If you develop any symptoms (ie: fever, flu-like symptoms, shortness of breath, cough etc.) before then, please call [phone].  If you test positive for Covid 19 in the 2 weeks post procedure, please call and report this information to [place].    If any biopsies were taken you will be contacted by phone or by letter within the next 1-3 weeks.  Please call us at [phone] if you have not heard about the biopsies in 3 weeks.    SIGNATURES/CONFIDENTIALITY: You and/or your care partner have signed paperwork which will be entered into your electronic medical record.  These signatures attest to the fact  that that the information above on your After Visit Summary has been reviewed and is understood.  Full responsibility of the confidentiality of this discharge information lies with you and/or your care-partner.

## 2021-05-09 NOTE — Op Note (Signed)
[facility] Patient Name: Kristine Curtis Procedure Date: 05/09/2021 3:17 PM MRN: [id number] Endoscopist: Iva Boop , MD Age: 61 Referring MD:  Date of Birth: 1960-10-13 Gender: Female Account #: [id number] Procedure:                Upper GI endoscopy Indications:              Heartburn, Nausea Medicines:                Propofol per Anesthesia, Monitored Anesthesia Care Procedure:                Pre-Anesthesia Assessment:                           - Prior to the procedure, a History and Physical                            was performed, and patient medications and                            allergies were reviewed. The patient's tolerance of                            previous anesthesia was also reviewed. The risks                            and benefits of the procedure and the sedation                            options and risks were discussed with the patient.                            All questions were answered, and informed consent                            was obtained. Prior Anticoagulants: The patient has                            taken no previous anticoagulant or antiplatelet                            agents. ASA Grade Assessment: III - A patient with                            severe systemic disease. After reviewing the risks                            and benefits, the patient was deemed in                            satisfactory condition to undergo the procedure.                           After obtaining informed consent, the endoscope was  passed under direct vision. Throughout the                            procedure, the patient's blood pressure, pulse, and                            oxygen saturations were monitored continuously. The                            Endoscope was introduced through the mouth, and                            advanced to the second part of duodenum. The upper                            GI  endoscopy was accomplished without difficulty.                            The patient tolerated the procedure well. Scope In: Scope Out: Findings:                 Patchy moderate inflammation characterized by                            erythema and granularity was found in the gastric                            antrum. Biopsies were taken with a cold forceps for                            histology. Verification of patient identification                            for the specimen was done. Estimated blood loss was                            minimal.                           The exam was otherwise without abnormality.                           The cardia and gastric fundus were normal on                            retroflexion. Complications:            No immediate complications. Estimated Blood Loss:     Estimated blood loss was minimal. Impression:               - Gastritis. Biopsied. ? from Plaquenil                           - The examination was otherwise normal. Recommendation:           - Patient has a contact number available for  emergencies. The signs and symptoms of potential                            delayed complications were discussed with the                            patient. Return to normal activities tomorrow.                            Written discharge instructions were provided to the                            patient.                           - Resume previous diet.                           - Continue present medications. i refilled prn                            metaclopramide                           - Await pathology results. Iva Boop, MD 05/09/2021 3:48:10 PM This report has been signed electronically.

## 2021-05-09 NOTE — Progress Notes (Signed)
History and Physical Interval Note:  05/09/2021 3:26 PM  Kristine Curtis  has presented today for endoscopic procedure(s), with the diagnosis of  Encounter Diagnosis  Name Primary?   Nausea Yes  .  The various methods of evaluation and treatment have been discussed with the patient and/or family. After consideration of risks, benefits and other options for treatment, the patient has consented to  the endoscopic procedure(s).   The patient's history has been reviewed, patient examined, no change in status, stable for endoscopic procedure(s).  I have reviewed the patient's chart and labs.  Questions were answered to the patient's satisfaction.     Iva Boop, MD, [facility]

## 2021-05-09 NOTE — Progress Notes (Signed)
Sedate, gd SR, tolerated procedure well, VSS, report to RN 

## 2021-05-11 ENCOUNTER — Telehealth: Payer: Self-pay | Admitting: *Deleted

## 2021-05-11 NOTE — Telephone Encounter (Signed)
°  Follow up Call-  Call back number 05/09/2021 06/29/2019  Post procedure Call Back phone  # [phone]  Permission to leave phone message Yes Yes  Some recent data might be hidden     Patient questions:  Do you have a fever, pain , or abdominal swelling? No. Pain Score  0 *  Have you tolerated food without any problems? Yes.    Have you been able to return to your normal activities? Yes.    Do you have any questions about your discharge instructions: Diet   No. Medications  No. Follow up visit  No.  Do you have questions or concerns about your Care? No.  Actions: * If pain score is 4 or above: No action needed, pain <4.  Have you developed a fever since your procedure? Pt states she has developed a low grade fever since the procedure.   2.   Have you had an respiratory symptoms (SOB or cough) since your procedure? Pt states she has developed a cough and body aches since the procedure.   3.   Have you tested positive for COVID 19 since your procedure? Pt states she does not have a test available at home that is not expired and she has not left her home to go anywhere.   4.   Have you had any family members/close contacts diagnosed with the COVID 19 since your procedure?  No  Pt states that the day before her procedure she was at [facility] ER with her dad for about 14 hours.    If yes to any of these questions please route to Joylene John, RN and Joella Prince, RN

## 2021-05-15 ENCOUNTER — Encounter: Payer: Self-pay | Admitting: Internal Medicine

## 2021-05-15 ENCOUNTER — Ambulatory Visit: Payer: Medicare Other | Admitting: Physician Assistant

## 2021-05-15 DIAGNOSIS — B9681 Helicobacter pylori [H. pylori] as the cause of diseases classified elsewhere: Secondary | ICD-10-CM | POA: Insufficient documentation

## 2021-05-15 DIAGNOSIS — K2970 Gastritis, unspecified, without bleeding: Secondary | ICD-10-CM | POA: Insufficient documentation

## 2021-05-16 ENCOUNTER — Other Ambulatory Visit: Payer: Self-pay

## 2021-05-16 DIAGNOSIS — B9681 Helicobacter pylori [H. pylori] as the cause of diseases classified elsewhere: Secondary | ICD-10-CM

## 2021-05-16 DIAGNOSIS — K2970 Gastritis, unspecified, without bleeding: Secondary | ICD-10-CM

## 2021-05-16 MED ORDER — OMEPRAZOLE 20 MG PO CPDR: 20.0000 mg | DELAYED_RELEASE_CAPSULE | Freq: Two times a day (BID) | ORAL | 0 refills | Status: DC

## 2021-05-16 MED ORDER — BISMUTH SUBSALICYLATE 262 MG PO CHEW: 524.0000 mg | CHEWABLE_TABLET | Freq: Four times a day (QID) | ORAL | 0 refills | Status: AC

## 2021-05-16 MED ORDER — DOXYCYCLINE HYCLATE 50 MG PO CAPS: 100.0000 mg | ORAL_CAPSULE | Freq: Two times a day (BID) | ORAL | 0 refills | Status: AC

## 2021-05-16 MED ORDER — METRONIDAZOLE 250 MG PO TABS: 250.0000 mg | ORAL_TABLET | Freq: Four times a day (QID) | ORAL | 0 refills | Status: AC

## 2021-05-24 ENCOUNTER — Encounter (INDEPENDENT_AMBULATORY_CARE_PROVIDER_SITE_OTHER): Payer: Self-pay | Admitting: Family Medicine

## 2021-05-24 ENCOUNTER — Ambulatory Visit (INDEPENDENT_AMBULATORY_CARE_PROVIDER_SITE_OTHER): Payer: Medicare Other | Admitting: Family Medicine

## 2021-05-24 ENCOUNTER — Other Ambulatory Visit: Payer: Self-pay

## 2021-05-24 VITALS — BP 117/78 | HR 94 | Temp 98.2°F | Ht 63.0 in | Wt 211.0 lb

## 2021-05-24 DIAGNOSIS — Z6841 Body Mass Index (BMI) 40.0 and over, adult: Secondary | ICD-10-CM

## 2021-05-24 DIAGNOSIS — F439 Reaction to severe stress, unspecified: Secondary | ICD-10-CM

## 2021-05-24 DIAGNOSIS — K2970 Gastritis, unspecified, without bleeding: Secondary | ICD-10-CM

## 2021-05-24 DIAGNOSIS — R110 Nausea: Secondary | ICD-10-CM

## 2021-05-24 DIAGNOSIS — M7918 Myalgia, other site: Secondary | ICD-10-CM | POA: Diagnosis not present

## 2021-05-24 DIAGNOSIS — B9681 Helicobacter pylori [H. pylori] as the cause of diseases classified elsewhere: Secondary | ICD-10-CM

## 2021-05-24 DIAGNOSIS — Z6837 Body mass index (BMI) 37.0-37.9, adult: Secondary | ICD-10-CM

## 2021-05-24 DIAGNOSIS — E669 Obesity, unspecified: Secondary | ICD-10-CM

## 2021-05-30 NOTE — Progress Notes (Signed)
Chief Complaint:   OBESITY Kristine Curtis is here to discuss her progress with her obesity treatment plan along with follow-up of her obesity related diagnoses. See Medical Weight Management Flowsheet for complete bioelectrical impedance results.  Today's visit was #: 22 Starting weight: 226 lbs Starting date: 06/21/2019 Weight change since last visit: 7 lbs Total lbs lost to date: 15 lbs Total weight loss percentage to date: -6.64%  Nutrition Plan: Practicing portion control and making smarter food choices, such as increasing vegetables and decreasing simple carbohydrates for 100% of the time. Activity: None.  Interim History: Kristine Curtis is going to pelvic floor PT and is working with a Economist.  She had an EGD with biopsies that showed H. pylori and moderate gastritis. She says she has been eating regularly - oatmeal, eggs, chicken, yogurt, vegetables.  She has been drinking, still with nausea, but decreased.  Assessment/Plan:   1. Myofascial pain dysfunction syndrome She is being followed by Rheumatology. Notes reviewed. We will continue to monitor symptoms as they relate to her weight loss journey.  2. Chronic nausea Improving. She will continue to focus on protein-rich, low simple carbohydrate foods. We reviewed the importance of hydration, regular exercise for stress reduction, and restorative sleep.   3. Helicobacter pylori gastritis She is being treated and followed by GI, Dr. Carlean Purl.  Taking omeprazole 20 mg twice daily, Pepto Bismol 2 tablets four times daily, metronidazole 250 mg four times daily, and doxycycline 100 mg twice daily.  4. Situational stress Behavior modification techniques were discussed today to help deal with emotional/non-hunger eating behaviors. Will continue to monitor as it relates to her weight loss journey.  5. Obesity BMI today 37.5  Course: Kristine Curtis is currently in the action stage of change. As such, her goal is to continue with weight loss efforts.    Nutrition goals: She has agreed to practicing portion control and making smarter food choices, such as increasing vegetables and decreasing simple carbohydrates.   Exercise goals:  As is.  Behavioral modification strategies: increasing lean protein intake, decreasing simple carbohydrates, increasing vegetables, and increasing water intake.  Keishawn has agreed to follow-up with our clinic in 4-6 weeks. She was informed of the importance of frequent follow-up visits to maximize her success with intensive lifestyle modifications for her multiple health conditions.   Objective:   Blood pressure 117/78, pulse 94, temperature 98.2 F (36.8 C), temperature source Oral, height 5\' 3"  (1.6 m), weight 211 lb (95.7 kg), SpO2 97 %. Body mass index is 37.38 kg/m.  General: Cooperative, alert, well developed, in no acute distress. HEENT: Conjunctivae and lids unremarkable. Cardiovascular: Regular rhythm.  Lungs: Normal work of breathing. Neurologic: No focal deficits.   Lab Results  Component Value Date   CREATININE 1.13 (H) 03/08/2021   BUN 15 03/08/2021   NA 142 03/08/2021   K 4.5 03/08/2021   CL 105 03/08/2021   CO2 23 03/08/2021   Lab Results  Component Value Date   ALT 26 03/08/2021   AST 23 03/08/2021   ALKPHOS 116 03/08/2021   BILITOT 0.4 03/08/2021   Lab Results  Component Value Date   HGBA1C 4.8 06/21/2019   HGBA1C 4.8 02/03/2019   HGBA1C 4.9 03/20/2016   HGBA1C 5.2 11/21/2014   Lab Results  Component Value Date   INSULIN 31.4 (H) 06/21/2019   Lab Results  Component Value Date   TSH 2.11 02/16/2021   Lab Results  Component Value Date   CHOL 164 02/01/2021   HDL 51.50  02/01/2021   LDLCALC 91 02/01/2021   LDLDIRECT 75.0 03/20/2016   TRIG 109.0 02/01/2021   CHOLHDL 3 02/01/2021   Lab Results  Component Value Date   VD25OH 49.7 06/21/2019   VD25OH 58.34 02/03/2019   VD25OH 45.39 03/20/2016   Lab Results  Component Value Date   WBC 3.8 (L) 02/01/2021    HGB 13.3 02/01/2021   HCT 40.2 02/01/2021   MCV 96.6 02/01/2021   PLT 173.0 02/01/2021   Lab Results  Component Value Date   IRON 41 (L) 11/17/2008   FERRITIN 25.1 11/17/2008   Attestation Statements:   Reviewed by clinician on day of visit: allergies, medications, problem list, medical history, surgical history, family history, social history, and previous encounter notes.  I, Water quality scientist, CMA, am acting as transcriptionist for Briscoe Deutscher, DO  I have reviewed the above documentation for accuracy and completeness, and I agree with the above. -  Briscoe Deutscher, DO, MS, FAAFP, DABOM - Family and Bariatric Medicine.

## 2021-05-31 NOTE — Telephone Encounter (Signed)
Massage received that pt has not read My Chart message that was sent previously in regard to the H Pylori treatment. Pt states that she has been taking the prescribed medications as instructed. Pt stated that she will review my Chart message that was sent Today. Pt verbalized understanding with all questions answered.

## 2021-06-20 ENCOUNTER — Telehealth (INDEPENDENT_AMBULATORY_CARE_PROVIDER_SITE_OTHER): Payer: Medicare Other | Admitting: Family Medicine

## 2021-06-20 ENCOUNTER — Other Ambulatory Visit: Payer: Self-pay

## 2021-06-20 ENCOUNTER — Encounter (INDEPENDENT_AMBULATORY_CARE_PROVIDER_SITE_OTHER): Payer: Self-pay | Admitting: Family Medicine

## 2021-06-20 DIAGNOSIS — M7918 Myalgia, other site: Secondary | ICD-10-CM | POA: Diagnosis not present

## 2021-06-20 DIAGNOSIS — K219 Gastro-esophageal reflux disease without esophagitis: Secondary | ICD-10-CM | POA: Diagnosis not present

## 2021-06-20 DIAGNOSIS — E669 Obesity, unspecified: Secondary | ICD-10-CM

## 2021-06-20 DIAGNOSIS — E66813 Obesity, class 3: Secondary | ICD-10-CM

## 2021-06-20 DIAGNOSIS — Z6837 Body mass index (BMI) 37.0-37.9, adult: Secondary | ICD-10-CM

## 2021-06-20 DIAGNOSIS — F3289 Other specified depressive episodes: Secondary | ICD-10-CM

## 2021-06-20 DIAGNOSIS — B9681 Helicobacter pylori [H. pylori] as the cause of diseases classified elsewhere: Secondary | ICD-10-CM

## 2021-06-20 DIAGNOSIS — Z6841 Body Mass Index (BMI) 40.0 and over, adult: Secondary | ICD-10-CM

## 2021-06-21 NOTE — Progress Notes (Signed)
TeleHealth Visit:  Due to the COVID-19 pandemic, this visit was completed with telemedicine (audio/video) technology to reduce patient and provider exposure as well as to preserve personal protective equipment.   [name] has verbally consented to this TeleHealth visit. The patient is located at home, the provider is located at the [facility] office. The participants in this visit include the listed provider and patient. The visit was conducted today via MyChart video.  OBESITY Kristine Curtis is here to discuss her progress with her obesity treatment plan along with follow-up of her obesity related diagnoses.   Today's visit was #: 23 Starting weight: 226 lbs Starting date: 06/21/2019  Interim History: Kristine Curtis says that her grandchildren have norovirus.  She says that currently she has no worsening of baseline.    Nutrition Plan: practicing portion control and making smarter food choices, such as increasing vegetables and decreasing simple carbohydrates for 100% of the time.  Activity: Increased activity.  Assessment/Plan:   1. Reflux Continues.  Status post treatment of H pylori.  She has follow-up with Dr. Leone Payor.   2. Myofascial pain dysfunction syndrome She is being followed by Rheumatology.  We will continue to monitor symptoms as they relate to her weight loss journey.  3. Other depression, with emotional eating  Worsened due to stress with father. Provided psychoeducation and facilitated discussion regarding eating as habit, self-monitoring, stimulus control, regulating eating pattern, coping skills, and barriers to success.   4. Obesity BMI today 37.5  Kristine Curtis is currently in the action stage of change. As such, her goal is to continue with weight loss efforts. She has agreed to practicing portion control and making smarter food choices, such as increasing vegetables and decreasing simple carbohydrates.   Exercise goals:  As is.  Behavioral modification  strategies: increasing lean protein intake, decreasing simple carbohydrates, increasing vegetables, increasing water intake, and decreasing liquid calories.  Kristine Curtis has agreed to follow-up with our clinic in 4 weeks. She was informed of the importance of frequent follow-up visits to maximize her success with intensive lifestyle modifications for her multiple health conditions.  Objective:   VITALS: Per patient if applicable, see vitals. GENERAL: Alert and in no acute distress. CARDIOPULMONARY: No increased WOB. Speaking in clear sentences.  PSYCH: Pleasant and cooperative. Speech normal rate and rhythm. Affect is appropriate. Insight and judgement are appropriate. Attention is focused, linear, and appropriate.  NEURO: Oriented as arrived to appointment on time with no prompting.   Lab Results  Component Value Date   CREATININE 1.13 (H) 03/08/2021   BUN 15 03/08/2021   NA 142 03/08/2021   K 4.5 03/08/2021   CL 105 03/08/2021   CO2 23 03/08/2021   Lab Results  Component Value Date   ALT 26 03/08/2021   AST 23 03/08/2021   ALKPHOS 116 03/08/2021   BILITOT 0.4 03/08/2021   Lab Results  Component Value Date   HGBA1C 4.8 06/21/2019   HGBA1C 4.8 02/03/2019   HGBA1C 4.9 03/20/2016   HGBA1C 5.2 11/21/2014   Lab Results  Component Value Date   INSULIN 31.4 (H) 06/21/2019   Lab Results  Component Value Date   TSH 2.11 02/16/2021   Lab Results  Component Value Date   CHOL 164 02/01/2021   HDL 51.50 02/01/2021   LDLCALC 91 02/01/2021   LDLDIRECT 75.0 03/20/2016   TRIG 109.0 02/01/2021   CHOLHDL 3 02/01/2021   Lab Results  Component Value Date   WBC 3.8 (L) 02/01/2021   HGB 13.3  02/01/2021   HCT 40.2 02/01/2021   MCV 96.6 02/01/2021   PLT 173.0 02/01/2021   Lab Results  Component Value Date   IRON 41 (L) 11/17/2008   FERRITIN 25.1 11/17/2008   Attestation Statements:   Reviewed by clinician on day of visit: allergies, medications, problem list, medical history,  surgical history, family history, social history, and previous encounter notes.  I, Insurance claims handler, CMA, am acting as transcriptionist for Helane Rima, DO  I have reviewed the above documentation for accuracy and completeness, and I agree with the above. - Helane Rima, DO, MS, FAAFP, DABOM - Family and Bariatric Medicine.

## 2021-07-02 ENCOUNTER — Other Ambulatory Visit: Payer: Self-pay | Admitting: Physician Assistant

## 2021-07-02 ENCOUNTER — Other Ambulatory Visit: Payer: Medicare Other

## 2021-07-02 DIAGNOSIS — B9681 Helicobacter pylori [H. pylori] as the cause of diseases classified elsewhere: Secondary | ICD-10-CM

## 2021-07-02 DIAGNOSIS — K2970 Gastritis, unspecified, without bleeding: Secondary | ICD-10-CM

## 2021-07-04 ENCOUNTER — Encounter: Payer: Self-pay | Admitting: Internal Medicine

## 2021-07-04 ENCOUNTER — Encounter: Payer: Self-pay | Admitting: Family Medicine

## 2021-07-04 LAB — H. PYLORI ANTIGEN, STOOL: H pylori Ag, Stl: NEGATIVE

## 2021-07-04 NOTE — Telephone Encounter (Signed)
Please advise 

## 2021-07-06 ENCOUNTER — Other Ambulatory Visit: Payer: Self-pay

## 2021-07-06 ENCOUNTER — Ambulatory Visit (INDEPENDENT_AMBULATORY_CARE_PROVIDER_SITE_OTHER): Payer: Medicare Other | Admitting: Family Medicine

## 2021-07-06 ENCOUNTER — Encounter: Payer: Self-pay | Admitting: Family Medicine

## 2021-07-06 VITALS — BP 110/76 | HR 83 | Temp 98.0°F | Ht 63.0 in | Wt 218.0 lb

## 2021-07-06 DIAGNOSIS — J984 Other disorders of lung: Secondary | ICD-10-CM

## 2021-07-06 DIAGNOSIS — J45909 Unspecified asthma, uncomplicated: Secondary | ICD-10-CM | POA: Diagnosis not present

## 2021-07-06 DIAGNOSIS — J309 Allergic rhinitis, unspecified: Secondary | ICD-10-CM

## 2021-07-06 MED ORDER — PEAK FLOW METER DEVI: 0 refills | Status: AC

## 2021-07-06 MED ORDER — ALBUTEROL SULFATE HFA 108 (90 BASE) MCG/ACT IN AERS: 2.0000 | INHALATION_SPRAY | Freq: Four times a day (QID) | RESPIRATORY_TRACT | 3 refills | Status: DC | PRN

## 2021-07-06 MED ORDER — FLUTICASONE-SALMETEROL 100-50 MCG/ACT IN AEPB: 1.0000 | INHALATION_SPRAY | Freq: Two times a day (BID) | RESPIRATORY_TRACT | 5 refills | Status: AC

## 2021-07-06 NOTE — Telephone Encounter (Signed)
Pt was scheduled for a follow up visit on 07/25/2021 at 10:10 AM with Dr. Carlean Purl: Pt made aware: Pt verbalized understanding with all questions answered.  ?

## 2021-07-06 NOTE — Patient Instructions (Signed)
Please return in September for your physical.  ? ?Use the peak flow meter and document your numbers.  ?Keep using the incentive spirometer to help strengthen your chest wall to help you take in deep breaths.  ? ?If you have any questions or concerns, please don't hesitate to send me a message via MyChart or call the office at [phone]. Thank you for visiting with [place] today! It's our pleasure caring for you.  ? ?Peak Flow Meter ?A peak flow meter is a device that helps you determine how well your asthma is being controlled. The device measures the flow of air out of your lungs. This is a simple but important tool in daily asthma management. Peak flow meters are available over the counter. ?The readings from the meter will help you and your health care provider: ?Determine how severe your asthma is. ?Identify triggers that worsen your asthma. ?See how well your current treatment is working. ?Figure out when to add or stop certain medicines. ?Recognize an asthma attack before signs or symptoms appear. ?Decide when to seek emergency care. ?What are the risks? ?Dizziness. Breathing too fast into the peak flow meter may make you dizzy. This could cause you to faint. Take your time so you do not get dizzy or light-headed. ?False readings. If you do not clean your meter, you may get false results. You may not know that your asthma is getting better or getting worse. This may make you start or stop treatment improperly. ?How to find your personal best ?Your "personal best" is the highest peak flow rate you can reach when you feel good and have no asthma symptoms. This can be used as your standard for comparing your peak flow meter readings. Because everyone's asthma is different, your personal best will be unique to you. ?Your health care provider will help you figure out your personal best. Typically, you will measure your peak flow once or twice a day for 2 weeks when you are not having symptoms. The highest reading  during the 2-week period is your personal best. Because your lung function can change over time, you should measure your personal best each year. ?How to use your peak flow meter ?Move the upper marker to the number that is your personal best. ?Move the lower marker to the bottom of the numbered scale. ?Connect the mouthpiece to the peak flow meter. ?Stand up. ?Take a deep breath. Make sure you fill your lungs completely. ?Place your lips tightly around the mouthpiece. Blow as hard and as fast as you can with a single breath (forced exhalation), as if you are blowing out candles. If your lips are not placed tightly around the mouthpiece, you will get incorrect low readings. ?At the end of your forced exhale, check to see what number the lower marker landed on. This is your peak flow rate. ?Move the lower marker back to the bottom of the numbered scale. ?Repeat these steps two more times. Record the highest reading of the three tries in your asthma diary. Do not calculate the average for your three tries. Just record the highest. ?Measure your peak flow around the same time each day. Always write down the results in your asthma diary. After using your peak flow meter, rest and breathe slowly and easily. Keep a record of your progress. Your health care provider can give you a simple table to help with this. For the most accurate readings, it is important to keep your peak flow meter clean. Follow the  manufacturer's instructions on how to take care of your peak flow meter. ?Your health care provider will give you instructions on when to do regular monitoring. You may also need to check your peak flow when: ?Coughing, wheezing, or other asthma symptoms wake you at night. ?Your asthma symptoms worsen during the day. ?Your breathing gets worse because of a cold, flu, or other respiratory illness. ?You need to use your quick-relief "rescue medicine." It is best to check your peak flow rate before you use rescue medicine,  and again 20-30 minutes afterward. This will help determine whether you need to take the rescue medicine again. ?How to use your results ?Use color-coded zones on the meter to see how your peak flow rate compares with your personal best. Your peak flow readings could fall too far below your personal best into the yellow or red zone. If this happens, you will need to take action to prevent or minimize an asthma attack. The color code for each zone reflects increasingly more severe symptoms: ?Green zone: Good ? ?Peak flow rate between 80% and 100% of your personal best. Your asthma is under good control when your peak flow rate is in the green zone. ?When you are in the green zone, you are not likely to be having any asthma symptoms. ?Take only your regular, preventive medicine. Your rescue medicine is not needed. ?Yellow zone: Caution ? ?Peak flow rate between 50% and 80% of your personal best. Your asthma is getting worse and could be improved. ?You may have started coughing, wheezing, or feeling chest tightness. Sometimes, peak flow rates dip down into the yellow zone before asthma symptoms appear. ?Consider increasing or changing your asthma medicine. This may include using your rescue medicine. If you have an asthma action plan, follow each step listed for the yellow zone, including medicine changes. ?Red zone: Warning ? ?Peak flow rate below 50% of your personal best. This may mean you are having, or are at risk of having, a medical emergency. ?Your coughing, wheezing, or shortness of breath may have become severe. Do not wait to use your rescue medicine. Stop whatever you are doing and use your rescue inhaler, nebulizer, or other medicines to open your airways. ?If you have an asthma action plan, follow each step listed for the red zone, including medicine changes and when to seek emergency medical care. ?Follow these instructions at home ?Take over-the-counter and prescription medicines only as told by your  health care provider. ?Keep all follow-up visits as told by your health care provider. This is important. ?Contact a health care provider if: ?You are in the yellow zone. If you have an asthma action plan, follow all steps listed under the yellow zone section of the plan. Let your health care provider know that you are in the yellow zone. ?Get help right away if: ?You are in the red zone. If you have an asthma action plan, follow all steps listed under the red zone section of the plan while you get medical care right away. ?These symptoms may represent a serious problem that is an emergency. Do not wait to see if the symptoms will go away. Get medical help right away. Call your local emergency services (911 in the [place] Do not drive yourself to the hospital. ?Summary ?A peak flow meter is a device that helps you determine how well your asthma is being controlled. The device measures the flow of air out of your lungs. ?Readings from the meter will help you determine  the severity of your asthma, whether your treatment is working, and when to start or stop treatment. ?Measure your personal best every year during periods of no symptoms. The meter will compare this reading with your regular readings to determine your condition at a given time. ?Measure your peak flow around the same time each day. Always write down the results of your peak flow readings in your asthma diary. Keep a record of your progress. ?Work with your health care provider to understand what each zone (green, yellow, red) means and what actions to take when your peak flow falls in each of these zones. ?This information is not intended to replace advice given to you by your health care provider. Make sure you discuss any questions you have with your health care provider. ?Document Revised: 04/19/2019 Document Reviewed: 04/19/2019 ?Elsevier Patient Education ? 2022 [employer]vier Inc. ? ?

## 2021-07-06 NOTE — Progress Notes (Signed)
Subjective  CC:  Chief Complaint  Patient presents with   Asthma    Pt c/o asthma flare over a week, cough and expectorating clear sputum, thick at times. She is using Mucinex     HPI: Kristine Curtis is a 61 y.o. female who presents to the office today to address the problems listed above in the chief complaint. Pt reports long ho asthma. Requests refills. Reviewed chart. C/o wheezing intermittenlty. No chest pain.   PFTs reviewed: showed mild restrictive lung disease.  allergies Assessment  1. Asthma, unspecified asthma severity, unspecified whether complicated, unspecified whether persistent   2. Restrictive lung disease   3. Chronic allergic rhinitis      Plan  asthma:  refilled adviar and albuterol.  Contiue allergie meds.   Follow up:  September for cpe Visit date not found  No orders of the defined types were placed in this encounter.  Meds ordered this encounter  Medications   Peak Flow Meter DEVI    Sig: Use as needed to check your breathing. Log your values    Dispense:  1 each    Refill:  0   albuterol (VENTOLIN HFA) 108 (90 Base) MCG/ACT inhaler    Sig: Inhale 2 puffs into the lungs every 6 (six) hours as needed for wheezing or shortness of breath.    Dispense:  8 g    Refill:  3   fluticasone-salmeterol (ADVAIR) 100-50 MCG/ACT AEPB    Sig: Inhale 1 puff into the lungs 2 (two) times daily.    Dispense:  1 each    Refill:  5      I reviewed the patients updated PMH, FH, and SocHx.    Patient Active Problem List   Diagnosis Date Noted   OSA (obstructive sleep apnea) 11/10/2017    Priority: High   Myofascial pain dysfunction syndrome 07/14/2017    Priority: High   Morbid obesity (HCC) 02/24/2017    Priority: High   Sjogren's disease (HCC) 02/24/2017    Priority: High   Fibromyalgia 01/21/2017    Priority: High   Chronic low back pain with sciatica 05/02/2015    Priority: High   Nonischemic cardiomyopathy (HCC) 03/22/2015    Priority: High    Depression with anxiety 08/31/2008    Priority: High   Essential hypertension 08/31/2008    Priority: High   Acquired hypothyroidism 08/30/2008    Priority: High   Helicobacter pylori gastritis 05/15/2021    Priority: Medium    Hormone replacement therapy (HRT) 07/29/2019    Priority: Medium    IBS (irritable bowel syndrome) 11/10/2017    Priority: Medium    RLS (restless legs syndrome) 10/01/2017    Priority: Medium    Moderate persistent asthma 08/31/2008    Priority: Medium    Esophageal reflux 08/31/2008    Priority: Medium    Classical migraine without intractable migraine 08/30/2008    Priority: Medium    B12 deficiency 11/10/2017    Priority: Low   Myalgia due to statin 11/10/2017    Priority: Low   Degenerative arthritis of left knee, XRAY12/2018 07/13/2017    Priority: Low   Vitamin D deficiency 10/09/2009    Priority: Low   Allergic rhinitis 08/31/2008    Priority: Low   Lumbar disc disease 10/01/2017   Cervical radiculopathy 07/14/2017   Current Meds  Medication Sig   aspirin-acetaminophen-caffeine (HEADACHE RELIEF) 250-250-65 MG tablet Take by mouth every 6 (six) hours as needed for headache.   Bacillus Coagulans-Inulin (  PROBIOTIC-PREBIOTIC) 1-250 BILLION-MG CAPS Take by mouth.   buPROPion (WELLBUTRIN XL) 300 MG 24 hr tablet TAKE 1 TABLET BY MOUTH DAILY   carvedilol (COREG) 12.5 MG tablet TAKE ONE TABLET TWICE A DAY WITH MEALS   Cholecalciferol (VITAMIN D-3 PO) Take 2,000 Units by mouth daily.   Dextrose-Fructose-Sod Citrate (NAUZENE) 6125990741 MG CHEW Chew by mouth.   diazepam (VALIUM) 5 MG tablet Take 1 tablet (5 mg total) by mouth daily as needed for anxiety.   fluticasone-salmeterol (ADVAIR) 100-50 MCG/ACT AEPB Inhale 1 puff into the lungs 2 (two) times daily.   furosemide (LASIX) 20 MG tablet Take 1 tablet (20 mg total) by mouth daily as needed for edema.   Galcanezumab-gnlm 120 MG/ML SOAJ Inject 120 mg into the skin every 30 (thirty) days.   glucose  blood (ONETOUCH ULTRA) test strip Use to check blood sugar TID   hydroxychloroquine (PLAQUENIL) 200 MG tablet Take 2 tablets (400 mg total) by mouth every morning.   JARDIANCE 10 MG TABS tablet TAKE ONE TABLET EACH MORNING BEFORE BREAKFAST   Lancets (ONETOUCH ULTRASOFT) lancets Use to check blood sugar TID   levothyroxine (SYNTHROID) 100 MCG tablet TAKE ONE TABLET EVERY DAY   losartan (COZAAR) 50 MG tablet TAKE 1 TABLET BY MOUTH DAILY.   Meclizine HCl 25 MG CHEW Chew by mouth daily as needed.   metoCLOPramide (REGLAN) 5 MG tablet Take 1 tablet (5 mg total) by mouth every 6 (six) hours as needed for nausea.   nystatin-triamcinolone ointment (MYCOLOG) Apply 1 application topically 2 (two) times daily as needed.   ondansetron (ZOFRAN ODT) 4 MG disintegrating tablet Take 1 tablet (4 mg total) by mouth every 8 (eight) hours as needed for nausea or vomiting.   OVER THE COUNTER MEDICATION Take 5 capsules by mouth daily. Ariix Optimals Vitamin & Minerals   OVER THE COUNTER MEDICATION Take 1 scoop by mouth daily. Ariix Magnecal D   oxyCODONE-acetaminophen (PERCOCET) 10-325 MG tablet Take 1 tablet by mouth daily as needed for pain.   Peak Flow Meter DEVI Use as needed to check your breathing. Log your values   rizatriptan (MAXALT) 10 MG tablet Take 10 mg by mouth as needed for migraine. May repeat in 2 hours if needed   spironolactone (ALDACTONE) 25 MG tablet TAKE ONE TABLET EVERY DAY   topiramate (TOPAMAX) 25 MG tablet Take 1 tablet (25 mg total) by mouth 2 (two) times daily.   UNABLE TO FIND Med Name: Nutrifil: Optimal-V, Vinali, Optimal-M, Magnical-D   Wheat Dextrin (BENEFIBER) POWD Take 2 scoop by mouth daily as needed (for constipation).    zinc gluconate 50 MG tablet Take 50 mg by mouth daily.   [DISCONTINUED] albuterol (VENTOLIN HFA) 108 (90 Base) MCG/ACT inhaler Inhale 2 puffs into the lungs every 6 (six) hours as needed for wheezing or shortness of breath.   [DISCONTINUED] Fluticasone-Salmeterol  (ADVAIR) 100-50 MCG/DOSE AEPB Inhale 1 puff into the lungs 2 (two) times daily.    Allergies: Patient is allergic to gabapentin (once-daily), saxenda [liraglutide -weight management], sulfa antibiotics, trintellix [vortioxetine], adhesive [tape], and codeine. Family History: Patient family history includes Alcoholism in her father; Depression in her mother; Diabetes in her father; Glaucoma in her father; Heart disease in her father; High blood pressure in her father; Kidney failure in her brother; Obesity in her father and mother; Pancreatic cancer in her mother; Thyroid cancer in her father and mother. Social History:  Patient  reports that she quit smoking about 32 years ago. Her smoking use included  cigarettes. She has a 24.00 pack-year smoking history. She has never used smokeless tobacco. She reports that she does not drink alcohol and does not use drugs.  Review of Systems: Constitutional: Negative for fever malaise or anorexia Cardiovascular: negative for chest pain Respiratory: negative for SOB or persistent cough Gastrointestinal: negative for abdominal pain  Objective  Vitals: BP 110/76 (BP Location: Left Arm, Patient Position: Sitting, Cuff Size: Large)    Pulse 83    Temp 98 F (36.7 C) (Temporal)    Ht 5\' 3"  (1.6 m)    Wt 218 lb (98.9 kg)    SpO2 99%    BMI 38.62 kg/m  General: no acute distress , A&Ox3 HEENT: PEERL, conjunctiva normal, neck is supple Cardiovascular:  RRR without murmur or gallop.  Respiratory:  Good breath sounds bilaterally, CTAB with normal respiratory effort, no wheezing Skin:  Warm, no rashes    Commons side effects, risks, benefits, and alternatives for medications and treatment plan prescribed today were discussed, and the patient expressed understanding of the given instructions. Patient is instructed to call or message via MyChart if he/she has any questions or concerns regarding our treatment plan. No barriers to understanding were identified. We  discussed Red Flag symptoms and signs in detail. Patient expressed understanding regarding what to do in case of urgent or emergency type symptoms.  Medication list was reconciled, printed and provided to the patient in AVS. Patient instructions and summary information was reviewed with the patient as documented in the AVS. This note was prepared with assistance of Dragon voice recognition software. Occasional wrong-word or sound-a-like substitutions may have occurred due to the inherent limitations of voice recognition software  This visit occurred during the SARS-CoV-2 public health emergency.  Safety protocols were in place, including screening questions prior to the visit, additional usage of staff PPE, and extensive cleaning of exam room while observing appropriate contact time as indicated for disinfecting solutions.

## 2021-07-13 ENCOUNTER — Encounter (INDEPENDENT_AMBULATORY_CARE_PROVIDER_SITE_OTHER): Payer: Self-pay | Admitting: Family Medicine

## 2021-07-18 ENCOUNTER — Ambulatory Visit (INDEPENDENT_AMBULATORY_CARE_PROVIDER_SITE_OTHER): Payer: Medicare Other | Admitting: Family Medicine

## 2021-07-19 NOTE — Progress Notes (Signed)
? ?Cardiology Office Note   ? ?Date:  07/27/2021  ? ?ID:  Kristine Curtis, DOB February 19, 1961, MRN [id number] ? ?PCP:  Willow Ora, MD  ?Cardiologist:  Dr. Swaziland  ? ?Chief Complaint  ?Patient presents with  ? Congestive Heart Failure  ? ? ?History of Present Illness:  ?Kristine Curtis is a 61 y.o. female with PMH of NICM, PVCs, HTN, fibromyalgia, morbid obesity, hypothyroidism, RLS and vertigo. She had a cardiac catheterization in 1999 that showed normal coronaries. EF previously as low as 35%, EF improved to 40-45% by 2017. Cardiac MRI in 2017 showed EF 44% with global hypokinesis, no scar or infiltration. She was admitted in February 2018 with 24 hour history of chest pain, dyspnea and headache. Cardiac enzyme were negative. Limited echocardiogram showed EF 40-45% without pericardial effusion. Stress test showed fixed anteroseptal and inferoseptal defect without ischemia. EF was measured 35%. Despite Myoview showing no ischemia, she continued to have exertional chest discomfort and subsequently underwent a left and right heart cath 07/11/2016 which showed normal coronaries. She also had normal cardiac output and EF 35-40%. She had a sleep study that showed in the past no obstructive sleep apnea. She does have restless leg syndrome ? ?Patient was admitted in September 2018 with a episode of syncope.  Outpatient 30-day event monitor was negative for significant arrhythmia. ? ?She has been followed by pharmacy and CHF therapy has been titrated. Now on losartan, Coreg, aldactdone, and Jardiance. She developed a cough on Entresto. After adjustments Echo was repeated in April 2022 showing normalization of EF to 55%.  ? ?On follow up today she is doing well from a cardiac standpoint. She denies any swelling weight is down 4 lbs. Is followed at healthy weight loss clinic. No palpitations. Tolerating medication well. She does note more asthma and post nasal drip. Is using Advair regularly now.  ? ? ? ?Past Medical  History:  ?Diagnosis Date  ? Allergic rhinitis due to pollen   ? Anemia   ? Anxiety   ? Asthma   ? Carpal tunnel syndrome of right wrist 12/22/2017  ? Cataract   ? Chronic combined systolic and diastolic CHF (congestive heart failure) (HCC)   ? 06/2016 Echo: EF 40-45%, Gr1 DD  ? Chronic fatigue   ? Depression   ? Family history of polycystic kidney with negative CT 11/16/2012  ? Fibromyalgia   ? GERD (gastroesophageal reflux disease)   ? Helicobacter pylori gastritis 05/2020  ? Hypertension   ? Hypothyroidism   ? IBS (irritable bowel syndrome)   ? Internal hemorrhoids   ? Lymphocytic colitis   ? Morbid obesity (HCC)   ? NICM (nonischemic cardiomyopathy) (HCC)   ? a. 1999 Cath: nl cors;  b. EF prev as low as 35%;  c. 11/2015 Echo: Ef 40-45%;  d. 06/2016 Echo: EF 40-45%;  e. Lexiscan MV: fixed anterosepta/inferseptal defect w/o ischemia, EF 35%.  ? Osteoarthritis   ? PONV (postoperative nausea and vomiting)   ? PVC (premature ventricular contraction)   ? Restless leg syndrome   ? Sjogren's syndrome (HCC)   ? Vertigo   ? ? ?Past Surgical History:  ?Procedure Laterality Date  ? ABDOMINAL HERNIA REPAIR    ? ABDOMINAL HYSTERECTOMY    ? CESAREAN SECTION    ? CHOLECYSTECTOMY  10/2008  ? COLONOSCOPY  02/14/2015  ? ESOPHAGOGASTRODUODENOSCOPY    ? EYE SURGERY    ? 2 2013 and 1981/DCR of right eye 05/2014  ? LUMBAR DISC SURGERY  2016  ?  L3-4  ? RIGHT/LEFT HEART CATH AND CORONARY ANGIOGRAPHY N/A 07/11/2016  ? Procedure: Right/Left Heart Cath and Coronary Angiography;  Surgeon: Laramie Meissner M Swaziland, MD;  Location: [facility] INVASIVE CV LAB;  Service: Cardiovascular;  Laterality: N/A;  ? SHOULDER ARTHROSCOPY W/ SUBACROMIAL DECOMPRESSION AND DISTAL CLAVICLE EXCISION Right   ? SPINE SURGERY    ? C6-C7, 03/2017  ? TUBAL LIGATION  1992  ? ? ?Current Medications: ?Outpatient Medications Prior to Visit  ?Medication Sig Dispense Refill  ? albuterol (VENTOLIN HFA) 108 (90 Base) MCG/ACT inhaler Inhale 2 puffs into the lungs every 6 (six) hours as needed  for wheezing or shortness of breath. 8 g 3  ? aspirin-acetaminophen-caffeine (HEADACHE RELIEF) 250-250-65 MG tablet Take by mouth every 6 (six) hours as needed for headache.    ? Azelastine HCl 137 MCG/SPRAY SOLN Place into the nose.    ? Bacillus Coagulans-Inulin (PROBIOTIC-PREBIOTIC) 1-250 BILLION-MG CAPS Take by mouth.    ? buPROPion (WELLBUTRIN XL) 300 MG 24 hr tablet TAKE 1 TABLET BY MOUTH DAILY 30 tablet 11  ? carvedilol (COREG) 12.5 MG tablet TAKE ONE TABLET TWICE A DAY WITH MEALS 180 tablet 3  ? cetirizine (ZYRTEC) 10 MG tablet Take 10 mg by mouth daily.    ? Cholecalciferol (VITAMIN D-3 PO) Take 2,000 Units by mouth daily.    ? Dextrose-Fructose-Sod Citrate (NAUZENE) 3648304166 MG CHEW Chew by mouth.    ? diazepam (VALIUM) 5 MG tablet TAKE ONE TABLET BY MOUTH EVERY DAY AS NEEDED FOR ANXIETY 30 tablet 5  ? fluticasone-salmeterol (ADVAIR) 100-50 MCG/ACT AEPB Inhale 1 puff into the lungs 2 (two) times daily. 1 each 5  ? furosemide (LASIX) 20 MG tablet Take 1 tablet (20 mg total) by mouth daily as needed for edema. 90 tablet 3  ? Galcanezumab-gnlm 120 MG/ML SOAJ Inject 120 mg into the skin every 30 (thirty) days.    ? glucose blood (ONETOUCH ULTRA) test strip Use to check blood sugar TID 100 each 0  ? hydroxychloroquine (PLAQUENIL) 200 MG tablet Take 2 tablets (400 mg total) by mouth every morning. 90 tablet 2  ? JARDIANCE 10 MG TABS tablet TAKE ONE TABLET EACH MORNING BEFORE BREAKFAST 90 tablet 3  ? Lancets (ONETOUCH ULTRASOFT) lancets Use to check blood sugar TID 100 each 0  ? levothyroxine (SYNTHROID) 100 MCG tablet TAKE ONE TABLET EVERY DAY 90 tablet 3  ? Meclizine HCl 25 MG CHEW Chew by mouth daily as needed.    ? metoCLOPramide (REGLAN) 5 MG tablet Take 1 tablet (5 mg total) by mouth every 6 (six) hours as needed for nausea. 60 tablet 0  ? nystatin-triamcinolone ointment (MYCOLOG) Apply 1 application topically 2 (two) times daily as needed. 60 g 2  ? omeprazole (PRILOSEC) 40 MG capsule Take 1 capsule (40  mg total) by mouth daily before breakfast. 90 capsule 3  ? ondansetron (ZOFRAN ODT) 4 MG disintegrating tablet Take 1 tablet (4 mg total) by mouth every 8 (eight) hours as needed for nausea or vomiting. 24 tablet 0  ? OVER THE COUNTER MEDICATION Take 5 capsules by mouth daily. Ariix Optimals Vitamin & Minerals    ? OVER THE COUNTER MEDICATION Take 1 scoop by mouth daily. Ariix Magnecal D    ? oxyCODONE-acetaminophen (PERCOCET) 10-325 MG tablet Take 1 tablet by mouth daily as needed for pain. 30 tablet 0  ? Peak Flow Meter DEVI Use as needed to check your breathing. Log your values 1 each 0  ? rizatriptan (MAXALT) 10 MG tablet Take  10 mg by mouth as needed for migraine. May repeat in 2 hours if needed    ? spironolactone (ALDACTONE) 25 MG tablet TAKE ONE TABLET EVERY DAY 90 tablet 3  ? topiramate (TOPAMAX) 25 MG tablet Take 1 tablet (25 mg total) by mouth 2 (two) times daily. 60 tablet 3  ? UNABLE TO FIND Med Name: Nutrifil: Optimal-V, Vinali, Optimal-M, Magnical-D    ? Wheat Dextrin (BENEFIBER) POWD Take 2 scoop by mouth daily as needed (for constipation).     ? zinc gluconate 50 MG tablet Take 50 mg by mouth daily.    ? losartan (COZAAR) 50 MG tablet TAKE 1 TABLET BY MOUTH DAILY. 30 tablet 5  ? ?No facility-administered medications prior to visit.  ?  ? ?Allergies:   Gabapentin (once-daily), Saxenda [liraglutide -weight management], Sulfa antibiotics, Trintellix [vortioxetine], Adhesive [tape], and Codeine  ? ?Social History  ? ?Socioeconomic History  ? Marital status: Married  ?  Spouse name: Dorinda Hill  ? Number of children: 2  ? Years of education: Not on file  ? Highest education level: Not on file  ?Occupational History  ? Occupation: Nutritional therapist  ? Occupation: disablity  ?Tobacco Use  ? Smoking status: Former  ?  Packs/day: 2.00  ?  Years: 12.00  ?  Pack years: 24.00  ?  Types: Cigarettes  ?  Quit date: 05/06/1989  ?  Years since quitting: 32.2  ? Smokeless tobacco: Never  ? Tobacco comments:  ?  quit  smoking 1990  ?Vaping Use  ? Vaping Use: Never used  ?Substance and Sexual Activity  ? Alcohol use: No  ?  Alcohol/week: 0.0 standard drinks  ? Drug use: No  ? Sexual activity: Yes  ?Other Topics Concern  ? N

## 2021-07-25 ENCOUNTER — Ambulatory Visit: Payer: Medicare Other | Admitting: Internal Medicine

## 2021-07-25 ENCOUNTER — Other Ambulatory Visit: Payer: Self-pay | Admitting: Family Medicine

## 2021-07-25 DIAGNOSIS — M797 Fibromyalgia: Secondary | ICD-10-CM

## 2021-07-26 ENCOUNTER — Encounter: Payer: Self-pay | Admitting: Internal Medicine

## 2021-07-26 ENCOUNTER — Ambulatory Visit (INDEPENDENT_AMBULATORY_CARE_PROVIDER_SITE_OTHER): Payer: Medicare Other | Admitting: Internal Medicine

## 2021-07-26 VITALS — BP 112/60 | HR 79 | Ht 63.0 in | Wt 218.0 lb

## 2021-07-26 DIAGNOSIS — K2970 Gastritis, unspecified, without bleeding: Secondary | ICD-10-CM

## 2021-07-26 DIAGNOSIS — R12 Heartburn: Secondary | ICD-10-CM | POA: Diagnosis not present

## 2021-07-26 DIAGNOSIS — R110 Nausea: Secondary | ICD-10-CM

## 2021-07-26 DIAGNOSIS — G629 Polyneuropathy, unspecified: Secondary | ICD-10-CM

## 2021-07-26 DIAGNOSIS — R0982 Postnasal drip: Secondary | ICD-10-CM

## 2021-07-26 DIAGNOSIS — B9681 Helicobacter pylori [H. pylori] as the cause of diseases classified elsewhere: Secondary | ICD-10-CM

## 2021-07-26 DIAGNOSIS — R42 Dizziness and giddiness: Secondary | ICD-10-CM

## 2021-07-26 DIAGNOSIS — M797 Fibromyalgia: Secondary | ICD-10-CM

## 2021-07-26 DIAGNOSIS — R1011 Right upper quadrant pain: Secondary | ICD-10-CM | POA: Diagnosis not present

## 2021-07-26 DIAGNOSIS — K580 Irritable bowel syndrome with diarrhea: Secondary | ICD-10-CM

## 2021-07-26 DIAGNOSIS — H9319 Tinnitus, unspecified ear: Secondary | ICD-10-CM

## 2021-07-26 DIAGNOSIS — G8929 Other chronic pain: Secondary | ICD-10-CM

## 2021-07-26 MED ORDER — OMEPRAZOLE 40 MG PO CPDR: 40.0000 mg | DELAYED_RELEASE_CAPSULE | Freq: Every day | ORAL | 3 refills | Status: DC

## 2021-07-26 NOTE — Patient Instructions (Signed)
If you are age 62 or older, your body mass index should be between 23-30. Your Body mass index is 38.62 kg/m?Marland Kitchen If this is out of the aforementioned range listed, please consider follow up with your Primary Care Provider. ? ?If you are age 47 or younger, your body mass index should be between 19-25. Your Body mass index is 38.62 kg/m?Marland Kitchen If this is out of the aformentioned range listed, please consider follow up with your Primary Care Provider.  ? ?________________________________________________________ ? ?The [facility] providers would like to encourage you to use [facility] to communicate with providers for non-urgent requests or questions.  Due to long hold times on the telephone, sending your provider a message by [facility] may be a faster and more efficient way to get a response.  Please allow 48 business hours for a response.  Please remember that this is for non-urgent requests.  ?_______________________________________________________ ? ?We have sent the following medications to your pharmacy for you to pick up at your convenience: ?Omeprazole ? ?Consider starting Cymbalta. ? ?We have placed a referral to [facility] located at [address] [place] [address]. Their phone # is [phone]. This is to get them to help you with your post-nasal drip, dizziness, and tinnitus. ? ?I appreciate the opportunity to care for you. ?Stan Head, MD, [facility]  ?

## 2021-07-26 NOTE — Progress Notes (Signed)
? ?Kristine Curtis y.o. 08-03-1960 [id number] ? ?Assessment & Plan:  ? ?Encounter Diagnoses  ?Name Primary?  ? Helicobacter pylori gastritis Yes  ? Heartburn   ? Abdominal pain, chronic, right upper quadrant   ? Small fiber neuropathy   ? Chronic nausea   ? Fibromyalgia   ? Irritable bowel syndrome with diarrhea   ? Tinnitus, unspecified laterality   ? Post-nasal drip   ? Dizziness and giddiness   ? ?' ?Helicobacter gastritis is treated but she still has a significant number of GI symptoms. ? ?Restart omeprazole daily for heartburn. ? ?I suspect a lot of functional disturbance that goes along with her fibromyalgia, nausea, IBS and chronic abdominal pain.  I do think duloxetine (Cymbalta) may help her.  Recently saw a neurologist for small fiber neuropathy who recommended that.  I encouraged her to try it. ? ?She has tinnitus postnasal drip and some dizziness though no overt vertigo.  I think ENT evaluation is worthwhile to see if there are any problems there that could be triggering her nausea plus she has these symptoms which are worth investigating. ? ?She is on numerous medications and nausea could be related to some of these as well as her other chronic conditions like Sjogren's and heart failure. ? ? ? ?Follow-up GI as needed pending the above ? ?CC: Willow Ora, MD ? ? ? ?Subjective:  ? ?Chief Complaint: Abdominal pain and nausea ? ?HPI ?Kristine Curtis is a 61 year old woman with fibromyalgia, Sjogren syndrome, small fiber neuropathy, and a history of IBS-D/lymphocytic colitis who presents for follow-up after she was investigated with EGD on 05/09/2021 demonstrating H. pylori gastritis.  Esophagus and duodenum were otherwise normal.  She was treated with omeprazole, Pepto-Bismol metronidazole and doxycycline and had confirmation of eradication of H. pylori on stool antigen.  She reports persistent heartburn and daily nausea.  There is a chronic right upper quadrant pain that will either burden be low-grade or  can intensify and feel like what she had her gallbladder removed for.  She relates nausea onset to stopping Flexeril and Mobic a year ago.  She has been tried on Maxalt for nausea as well as IBgard.  She has been diagnosed with small fiber neuropathy which she says is related to her Sjogren's and saw a neurologist at [facility] recently and apparently duloxetine was something mentioned as a possible treatment.  Kristine Curtis recalls being on duloxetine a number of years ago but not for long and does not think she ever achieved a 60 mg dose.  She does not recall problems with it but apparently it did not work for fibromyalgia, but again she was not on it long and did not achieve a high dose.  She used to have lymphocytic colitis type problems treated with budesonide but these issues and IBS diarrhea issues are gone with the use of acai fiber. ? ?Note that she does not vomit with all of this nausea.  She is using some Pepto-Bismol and over-the-counter acid reducers to try to help heartburn. ? ?Review of systems is positive for a lot of postnasal drip which is being treated with some inhalers and nasal sprays.  She also admits to having tinnitus and some dizziness.  There have been vertigo problems in the past but those are not an issue now. ?Allergies  ?Allergen Reactions  ? Gabapentin (Once-Daily)   ?  Depression   ? Saxenda [Liraglutide -Weight Management] Other (See Comments)  ?  Depression   ? Sulfa Antibiotics   ?  itching  ? Trintellix [Vortioxetine] Other (See Comments)  ?  GI issues   ? Adhesive [Tape] Rash  ? Codeine Itching  ? ?Current Meds  ?Medication Sig  ? albuterol (VENTOLIN HFA) 108 (90 Base) MCG/ACT inhaler Inhale 2 puffs into the lungs every 6 (six) hours as needed for wheezing or shortness of breath.  ? aspirin-acetaminophen-caffeine (HEADACHE RELIEF) 250-250-65 MG tablet Take by mouth every 6 (six) hours as needed for headache.  ? Azelastine HCl 137 MCG/SPRAY SOLN Place into the nose.  ? Bacillus  Coagulans-Inulin (PROBIOTIC-PREBIOTIC) 1-250 BILLION-MG CAPS Take by mouth.  ? buPROPion (WELLBUTRIN XL) 300 MG 24 hr tablet TAKE 1 TABLET BY MOUTH DAILY  ? carvedilol (COREG) 12.5 MG tablet TAKE ONE TABLET TWICE A DAY WITH MEALS  ? cetirizine (ZYRTEC) 10 MG tablet Take 10 mg by mouth daily.  ? Cholecalciferol (VITAMIN D-3 PO) Take 2,000 Units by mouth daily.  ? Dextrose-Fructose-Sod Citrate (NAUZENE) 330 827 8702 MG CHEW Chew by mouth.  ? diazepam (VALIUM) 5 MG tablet TAKE ONE TABLET BY MOUTH EVERY DAY AS NEEDED FOR ANXIETY  ? fluticasone-salmeterol (ADVAIR) 100-50 MCG/ACT AEPB Inhale 1 puff into the lungs 2 (two) times daily.  ? furosemide (LASIX) 20 MG tablet Take 1 tablet (20 mg total) by mouth daily as needed for edema.  ? Galcanezumab-gnlm 120 MG/ML SOAJ Inject 120 mg into the skin every 30 (thirty) days.  ? glucose blood (ONETOUCH ULTRA) test strip Use to check blood sugar TID  ? hydroxychloroquine (PLAQUENIL) 200 MG tablet Take 2 tablets (400 mg total) by mouth every morning.  ? JARDIANCE 10 MG TABS tablet TAKE ONE TABLET EACH MORNING BEFORE BREAKFAST  ? Lancets (ONETOUCH ULTRASOFT) lancets Use to check blood sugar TID  ? levothyroxine (SYNTHROID) 100 MCG tablet TAKE ONE TABLET EVERY DAY  ? losartan (COZAAR) 50 MG tablet TAKE 1 TABLET BY MOUTH DAILY.  ? Meclizine HCl 25 MG CHEW Chew by mouth daily as needed.  ? metoCLOPramide (REGLAN) 5 MG tablet Take 1 tablet (5 mg total) by mouth every 6 (six) hours as needed for nausea.  ? nystatin-triamcinolone ointment (MYCOLOG) Apply 1 application topically 2 (two) times daily as needed.  ? ondansetron (ZOFRAN ODT) 4 MG disintegrating tablet Take 1 tablet (4 mg total) by mouth every 8 (eight) hours as needed for nausea or vomiting.  ? OVER THE COUNTER MEDICATION Take 5 capsules by mouth daily. [employer]ix Optimals Vitamin & Minerals  ? OVER THE COUNTER MEDICATION Take 1 scoop by mouth daily. Ariix Magnecal D  ? oxyCODONE-acetaminophen (PERCOCET) 10-325 MG tablet Take 1 tablet  by mouth daily as needed for pain.  ? Peak Flow Meter DEVI Use as needed to check your breathing. Log your values  ? rizatriptan (MAXALT) 10 MG tablet Take 10 mg by mouth as needed for migraine. May repeat in 2 hours if needed  ? spironolactone (ALDACTONE) 25 MG tablet TAKE ONE TABLET EVERY DAY  ? topiramate (TOPAMAX) 25 MG tablet Take 1 tablet (25 mg total) by mouth 2 (two) times daily.  ? UNABLE TO FIND Med Name: Nutrifil: Optimal-V, Vinali, Optimal-M, Magnical-D  ? Wheat Dextrin (BENEFIBER) POWD Take 2 scoop by mouth daily as needed (for constipation).   ? zinc gluconate 50 MG tablet Take 50 mg by mouth daily.  ? ?Past Medical History:  ?Diagnosis Date  ? Allergic rhinitis due to pollen   ? Anemia   ? Anxiety   ? Asthma   ? Carpal tunnel syndrome of right wrist 12/22/2017  ? Cataract   ?  Chronic combined systolic and diastolic CHF (congestive heart failure) (HCC)   ? 06/2016 Echo: EF 40-45%, Gr1 DD  ? Chronic fatigue   ? Depression   ? Family history of polycystic kidney with negative CT 11/16/2012  ? Fibromyalgia   ? GERD (gastroesophageal reflux disease)   ? Helicobacter pylori gastritis 05/2020  ? Hypertension   ? Hypothyroidism   ? IBS (irritable bowel syndrome)   ? Internal hemorrhoids   ? Lymphocytic colitis   ? Morbid obesity (HCC)   ? NICM (nonischemic cardiomyopathy) (HCC)   ? a. 1999 Cath: nl cors;  b. EF prev as low as 35%;  c. 11/2015 Echo: Ef 40-45%;  d. 06/2016 Echo: EF 40-45%;  e. Lexiscan MV: fixed anterosepta/inferseptal defect w/o ischemia, EF 35%.  ? Osteoarthritis   ? PONV (postoperative nausea and vomiting)   ? PVC (premature ventricular contraction)   ? Restless leg syndrome   ? Sjogren's syndrome (HCC)   ? Vertigo   ? ?Past Surgical History:  ?Procedure Laterality Date  ? ABDOMINAL HERNIA REPAIR    ? ABDOMINAL HYSTERECTOMY    ? CESAREAN SECTION    ? CHOLECYSTECTOMY  10/2008  ? COLONOSCOPY  02/14/2015  ? ESOPHAGOGASTRODUODENOSCOPY    ? EYE SURGERY    ? 2 2013 and 1981/DCR of right eye 05/2014  ?  LUMBAR DISC SURGERY  2016  ? L3-4  ? RIGHT/LEFT HEART CATH AND CORONARY ANGIOGRAPHY N/A 07/11/2016  ? Procedure: Right/Left Heart Cath and Coronary Angiography;  Surgeon: Peter M Swaziland, MD;  Location: [facility] INVASI

## 2021-07-27 ENCOUNTER — Ambulatory Visit (INDEPENDENT_AMBULATORY_CARE_PROVIDER_SITE_OTHER): Payer: Medicare Other | Admitting: Cardiology

## 2021-07-27 ENCOUNTER — Encounter: Payer: Self-pay | Admitting: Cardiology

## 2021-07-27 ENCOUNTER — Other Ambulatory Visit: Payer: Self-pay

## 2021-07-27 VITALS — BP 104/70 | HR 76 | Ht 63.0 in | Wt 217.2 lb

## 2021-07-27 DIAGNOSIS — I493 Ventricular premature depolarization: Secondary | ICD-10-CM | POA: Diagnosis not present

## 2021-07-27 DIAGNOSIS — I428 Other cardiomyopathies: Secondary | ICD-10-CM

## 2021-07-27 DIAGNOSIS — I10 Essential (primary) hypertension: Secondary | ICD-10-CM

## 2021-07-27 DIAGNOSIS — I5022 Chronic systolic (congestive) heart failure: Secondary | ICD-10-CM | POA: Diagnosis not present

## 2021-07-27 MED ORDER — LOSARTAN POTASSIUM 50 MG PO TABS
50.0000 mg | ORAL_TABLET | Freq: Every day | ORAL | 3 refills | Status: DC
Start: 2021-07-27 — End: 2022-08-20

## 2021-08-01 ENCOUNTER — Telehealth: Payer: Self-pay | Admitting: Family Medicine

## 2021-08-01 NOTE — Telephone Encounter (Signed)
Copied from CRM [id number]. Topic: Medicare AWV ?>> Aug 01, 2021 12:48 PM Harris-Coley, Avon Gully wrote: ?Reason for CRM: Left message for patient to schedule Annual Wellness Visit.  Please schedule with Nurse Health Advisor Lanier Ensign, RN at [facility].  Please call [phone] ask for Olegario Messier ?

## 2021-08-16 ENCOUNTER — Encounter (INDEPENDENT_AMBULATORY_CARE_PROVIDER_SITE_OTHER): Payer: Self-pay | Admitting: Family Medicine

## 2021-08-16 ENCOUNTER — Telehealth (INDEPENDENT_AMBULATORY_CARE_PROVIDER_SITE_OTHER): Payer: Medicare Other | Admitting: Family Medicine

## 2021-08-16 DIAGNOSIS — M3500 Sicca syndrome, unspecified: Secondary | ICD-10-CM

## 2021-08-16 DIAGNOSIS — R110 Nausea: Secondary | ICD-10-CM

## 2021-08-16 DIAGNOSIS — F3289 Other specified depressive episodes: Secondary | ICD-10-CM

## 2021-08-16 DIAGNOSIS — Z6841 Body Mass Index (BMI) 40.0 and over, adult: Secondary | ICD-10-CM

## 2021-08-16 DIAGNOSIS — F439 Reaction to severe stress, unspecified: Secondary | ICD-10-CM | POA: Diagnosis not present

## 2021-08-16 DIAGNOSIS — Z6837 Body mass index (BMI) 37.0-37.9, adult: Secondary | ICD-10-CM

## 2021-08-16 DIAGNOSIS — E669 Obesity, unspecified: Secondary | ICD-10-CM

## 2021-08-16 NOTE — Progress Notes (Signed)
TeleHealth Visit:  Due to the COVID-19 pandemic, this visit was completed with telemedicine (audio/video) technology to reduce patient and provider exposure as well as to preserve personal protective equipment.   [place] has verbally consented to this TeleHealth visit. The patient is located at home, the provider is located at home. The participants in this visit include the listed provider and patient. The visit was conducted today via MyChart video.  OBESITY Kristine Curtis is here to discuss her progress with her obesity treatment plan along with follow-up of her obesity related diagnoses.   Today's visit was #: 24 Starting weight: 226 lbs Starting date: 06/21/2019 Today's date: 08/16/2021 Does not weight at home.  Interim History: Father is still causing lots of anxiety, but she is continuing to work with Economist on boundary setting. Sexual health improving with PT and treatment through GYN. Reviewed visits with GI, Neurology. She has a visit with Rheumatology tomorrow.   Nutrition Plan: practicing portion control and making smarter food choices, such as increasing vegetables and decreasing simple carbohydrates for 100% of the time.  Activity: None.  Assessment/Plan:   Diagnoses and all orders for this visit:  Other depression, with emotional eating  Discussed social challenges and choosing foods in social situations. Discussed assertive communication skills for coping with social relationships, relapse prevention, and possible challenges ahead and how to overcome.  Situational stress Discussed cues and consequences, how thoughts affect eating, model of thoughts, feelings, and behaviors, and strategies for change by focusing on the cue. Discussed cognitive distortions, coping thoughts, and how to change your thoughts.  Chronic nausea Referred to ENT by GI to see if chronic rhinitis could be contributing. We will continue to monitor symptoms as they relate to her weight loss  journey.  Sjogren's syndrome, with associated multiple joint and muscle pain, fatigue, and fibromyalgia Followed by Rheumatology. The current medical regimen is effective;  continue present plan and medications. We will continue to monitor symptoms as they relate to her weight loss journey.  Obesity BMI today 37.5 Yichen is currently in the action stage of change. As such, her goal is to continue with weight loss efforts. She has agreed to practicing portion control and making smarter food choices, such as increasing vegetables and decreasing simple carbohydrates.   Exercise goals: As is.  Behavioral modification strategies: increasing lean protein intake and increasing water intake.  Najee has agreed to follow-up with our clinic in 4 weeks. She was informed of the importance of frequent follow-up visits to maximize her success with intensive lifestyle modifications for her multiple health conditions.  Objective:   VITALS: Per patient if applicable, see vitals. GENERAL: Alert and in no acute distress. CARDIOPULMONARY: No increased WOB. Speaking in clear sentences.  PSYCH: Pleasant and cooperative. Speech normal rate and rhythm. Affect is appropriate. Insight and judgement are appropriate. Attention is focused, linear, and appropriate.  NEURO: Oriented as arrived to appointment on time with no prompting.   Lab Results  Component Value Date   CREATININE 1.13 (H) 03/08/2021   BUN 15 03/08/2021   NA 142 03/08/2021   K 4.5 03/08/2021   CL 105 03/08/2021   CO2 23 03/08/2021   Lab Results  Component Value Date   ALT 26 03/08/2021   AST 23 03/08/2021   ALKPHOS 116 03/08/2021   BILITOT 0.4 03/08/2021   Lab Results  Component Value Date   HGBA1C 4.8 06/21/2019   HGBA1C 4.8 02/03/2019   HGBA1C 4.9 03/20/2016   HGBA1C 5.2 11/21/2014  Lab Results  Component Value Date   INSULIN 31.4 (H) 06/21/2019   Lab Results  Component Value Date   TSH 2.11 02/16/2021   Lab Results   Component Value Date   CHOL 164 02/01/2021   HDL 51.50 02/01/2021   LDLCALC 91 02/01/2021   LDLDIRECT 75.0 03/20/2016   TRIG 109.0 02/01/2021   CHOLHDL 3 02/01/2021   Lab Results  Component Value Date   WBC 3.8 (L) 02/01/2021   HGB 13.3 02/01/2021   HCT 40.2 02/01/2021   MCV 96.6 02/01/2021   PLT 173.0 02/01/2021   Lab Results  Component Value Date   IRON 41 (L) 11/17/2008   FERRITIN 25.1 11/17/2008   Attestation Statements:   Reviewed by clinician on day of visit: allergies, medications, problem list, medical history, surgical history, family history, social history, and previous encounter notes.

## 2021-08-21 ENCOUNTER — Other Ambulatory Visit: Payer: Self-pay | Admitting: Cardiology

## 2021-11-12 ENCOUNTER — Ambulatory Visit (INDEPENDENT_AMBULATORY_CARE_PROVIDER_SITE_OTHER): Payer: Medicare Other

## 2021-11-12 DIAGNOSIS — Z0000 Encounter for general adult medical examination without abnormal findings: Secondary | ICD-10-CM | POA: Diagnosis not present

## 2021-11-12 NOTE — Progress Notes (Signed)
Virtual Visit via Telephone Note  I connected with  Kristine Curtis on 11/12/21 at 11:30 AM EDT by telephone and verified that I am speaking with the correct person using two identifiers.  Medicare Annual Wellness visit completed telephonically due to Covid-19 pandemic.   Persons participating in this call: This Health Coach and this patient.   Location: Patient: Home Provider: Office    I discussed the limitations, risks, security and privacy concerns of performing an evaluation and management service by telephone and the availability of in person appointments. The patient expressed understanding and agreed to proceed.  Unable to perform video visit due to video visit attempted and failed and/or patient does not have video capability.   Some vital signs may be absent or patient reported.   Marzella Schlein, LPN   Subjective:   Kristine Curtis is a 61 y.o. female who presents for Medicare Annual (Subsequent) preventive examination.  Review of Systems     Cardiac Risk Factors include: advanced age (>60men, >58 women);hypertension;obesity (BMI >30kg/m2)     Objective:    There were no vitals filed for this visit. There is no height or weight on file to calculate BMI.     11/12/2021   11:46 AM 12/06/2019   11:53 AM 01/14/2017    8:41 PM 07/11/2016    7:56 AM 06/26/2016   12:10 PM 06/25/2016    9:06 PM 02/14/2015    2:16 PM  Advanced Directives  Does Patient Have a Medical Advance Directive? Yes Yes No No No No No  Type of Estate agent of [place];Living will Healthcare Power of [place];Living will       Copy of Healthcare Power of Attorney in Chart? No - copy requested No - copy requested       Would patient like information on creating a medical advance directive?   No - Patient declined No - Patient declined No - Patient declined;Yes (Inpatient - patient requests chaplain consult to create a medical advance directive)  No - patient declined information     Current Medications (verified) Outpatient Encounter Medications as of 11/12/2021  Medication Sig   albuterol (VENTOLIN HFA) 108 (90 Base) MCG/ACT inhaler Inhale 2 puffs into the lungs every 6 (six) hours as needed for wheezing or shortness of breath.   aspirin-acetaminophen-caffeine (HEADACHE RELIEF) 250-250-65 MG tablet Take by mouth every 6 (six) hours as needed for headache.   Atogepant (QULIPTA PO) Take by mouth.   Azelastine HCl 137 MCG/SPRAY SOLN Place into the nose.   Bacillus Coagulans-Inulin (PROBIOTIC-PREBIOTIC) 1-250 BILLION-MG CAPS Take by mouth.   buPROPion (WELLBUTRIN XL) 300 MG 24 hr tablet TAKE 1 TABLET BY MOUTH DAILY   carvedilol (COREG) 12.5 MG tablet TAKE ONE TABLET TWICE A DAY WITH MEALS   cetirizine (ZYRTEC) 10 MG tablet Take 10 mg by mouth daily.   Cholecalciferol (VITAMIN D-3 PO) Take 2,000 Units by mouth daily.   Dextrose-Fructose-Sod Citrate (NAUZENE) [phone] MG CHEW Chew by mouth.   diazepam (VALIUM) 5 MG tablet TAKE ONE TABLET BY MOUTH EVERY DAY AS NEEDED FOR ANXIETY   ESTRADIOL PO Take by mouth.   fluticasone-salmeterol (ADVAIR) 100-50 MCG/ACT AEPB Inhale 1 puff into the lungs 2 (two) times daily.   furosemide (LASIX) 20 MG tablet Take 1 tablet (20 mg total) by mouth daily as needed for edema.   glucose blood (ONETOUCH ULTRA) test strip Use to check blood sugar TID   hydroxychloroquine (PLAQUENIL) 200 MG tablet Take 2 tablets (400 mg total)  by mouth every morning.   JARDIANCE 10 MG TABS tablet TAKE ONE TABLET EACH MORNING BEFORE BREAKFAST   Lancets (ONETOUCH ULTRASOFT) lancets Use to check blood sugar TID   levothyroxine (SYNTHROID) 100 MCG tablet TAKE ONE TABLET EVERY DAY   losartan (COZAAR) 50 MG tablet Take 1 tablet (50 mg total) by mouth daily.   Meclizine HCl 25 MG CHEW Chew by mouth daily as needed.   metoCLOPramide (REGLAN) 5 MG tablet Take 1 tablet (5 mg total) by mouth every 6 (six) hours as needed for nausea.   nystatin-triamcinolone ointment  (MYCOLOG) Apply 1 application topically 2 (two) times daily as needed.   omeprazole (PRILOSEC) 40 MG capsule Take 1 capsule (40 mg total) by mouth daily before breakfast.   OVER THE COUNTER MEDICATION Take 5 capsules by mouth daily. Ariix Optimals Vitamin & Minerals   OVER THE COUNTER MEDICATION Take 1 scoop by mouth daily. Ariix Magnecal D   oxyCODONE-acetaminophen (PERCOCET) 10-325 MG tablet Take 1 tablet by mouth daily as needed for pain.   Peak Flow Meter DEVI Use as needed to check your breathing. Log your values   rizatriptan (MAXALT) 10 MG tablet Take 10 mg by mouth as needed for migraine. May repeat in 2 hours if needed   spironolactone (ALDACTONE) 25 MG tablet TAKE ONE TABLET EVERY DAY   tirzepatide (MOUNJARO) 2.5 MG/0.5ML Pen Inject 2.5 mg as directed once a week   topiramate (TOPAMAX) 25 MG tablet Take 1 tablet (25 mg total) by mouth 2 (two) times daily.   UNABLE TO FIND Med Name: Nutrifil: Optimal-V, Vinali, Optimal-M, Magnical-D   Wheat Dextrin (BENEFIBER) POWD Take 2 scoop by mouth daily as needed (for constipation).    zinc gluconate 50 MG tablet Take 50 mg by mouth daily.   Galcanezumab-gnlm 120 MG/ML SOAJ Inject 120 mg into the skin every 30 (thirty) days. (Patient not taking: Reported on 11/12/2021)   ondansetron (ZOFRAN ODT) 4 MG disintegrating tablet Take 1 tablet (4 mg total) by mouth every 8 (eight) hours as needed for nausea or vomiting. (Patient not taking: Reported on 11/12/2021)   TYRVAYA 0.03 MG/ACT SOLN Place into both nostrils.   No facility-administered encounter medications on file as of 11/12/2021.    Allergies (verified) Gabapentin (once-daily), Saxenda [liraglutide -weight management], Sulfa antibiotics, Trintellix [vortioxetine], Adhesive [tape], and Codeine   History: Past Medical History:  Diagnosis Date   Allergic rhinitis due to pollen    Anemia    Anxiety    Asthma    Carpal tunnel syndrome of right wrist 12/22/2017   Cataract    Chronic combined  systolic and diastolic CHF (congestive heart failure) (HCC)    06/2016 Echo: EF 40-45%, Gr1 DD   Chronic fatigue    Depression    Family history of polycystic kidney with negative CT 11/16/2012   Fibromyalgia    GERD (gastroesophageal reflux disease)    Helicobacter pylori gastritis 05/2020   Hypertension    Hypothyroidism    IBS (irritable bowel syndrome)    Internal hemorrhoids    Lymphocytic colitis    Morbid obesity (HCC)    NICM (nonischemic cardiomyopathy) (HCC)    a. 1999 Cath: nl cors;  b. EF prev as low as 35%;  c. 11/2015 Echo: Ef 40-45%;  d. 06/2016 Echo: EF 40-45%;  e. Lexiscan MV: fixed anterosepta/inferseptal defect w/o ischemia, EF 35%.   Osteoarthritis    PONV (postoperative nausea and vomiting)    PVC (premature ventricular contraction)    Restless leg syndrome  Sjogren's syndrome (HCC)    Vertigo    Past Surgical History:  Procedure Laterality Date   ABDOMINAL HERNIA REPAIR     ABDOMINAL HYSTERECTOMY     CESAREAN SECTION     CHOLECYSTECTOMY  10/2008   COLONOSCOPY  02/14/2015   ESOPHAGOGASTRODUODENOSCOPY     EYE SURGERY     2 2013 and 1981/DCR of right eye 05/2014   LUMBAR DISC SURGERY  2016   L3-4   RIGHT/LEFT HEART CATH AND CORONARY ANGIOGRAPHY N/A 07/11/2016   Procedure: Right/Left Heart Cath and Coronary Angiography;  Surgeon: Peter M Swaziland, MD;  Location: [facility] INVASIVE CV LAB;  Service: Cardiovascular;  Laterality: N/A;   SHOULDER ARTHROSCOPY W/ SUBACROMIAL DECOMPRESSION AND DISTAL CLAVICLE EXCISION Right    SPINE SURGERY     C6-C7, 03/2017   TUBAL LIGATION  1992   Family History  Problem Relation Age of Onset   Pancreatic cancer Mother    Thyroid cancer Mother    Depression Mother    Obesity Mother    Diabetes Father    Heart disease Father    Glaucoma Father    High blood pressure Father    Thyroid cancer Father    Alcoholism Father    Obesity Father    Kidney failure Brother    Colon cancer Neg Hx    Stomach cancer Neg Hx    Esophageal  cancer Neg Hx    Colon polyps Neg Hx    Rectal cancer Neg Hx    Social History   Socioeconomic History   Marital status: Married    Spouse name: Dorinda Hill   Number of children: 2   Years of education: Not on file   Highest education level: Not on file  Occupational History   Occupation: Nutritional therapist   Occupation: disablity  Tobacco Use   Smoking status: Former    Packs/day: 2.00    Years: 12.00    Total pack years: 24.00    Types: Cigarettes    Quit date: 05/06/1989    Years since quitting: 32.5   Smokeless tobacco: Never   Tobacco comments:    quit smoking 1990  Vaping Use   Vaping Use: Never used  Substance and Sexual Activity   Alcohol use: No    Alcohol/week: 0.0 standard drinks of alcohol   Drug use: No   Sexual activity: Yes  Other Topics Concern   Not on file  Social History Narrative   Former Dr. Andrey Campanile then Dr. 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, podiatrist   Social Determinants of Health   Financial Resource Strain: Low Risk  (11/12/2021)   Overall Financial Resource Strain (CARDIA)    Difficulty of Paying Living Expenses: Not hard at all  Food Insecurity: No Food Insecurity (11/12/2021)   Hunger Vital Sign    Worried About Running Out of Food in the Last Year: Never true    Ran Out of Food in the Last Year: Never true  Transportation Needs: No Transportation Needs (11/12/2021)   PRAPARE - Administrator, Civil Service (Medical): No    Lack of Transportation (Non-Medical): No  Physical Activity: Insufficiently Active (11/12/2021)   Exercise Vital Sign    Days of Exercise per Week: 2 days    Minutes of Exercise per Session: 20 min  Stress: No Stress Concern Present (11/12/2021)   [employer] of Occupational Health - Occupational Stress Questionnaire    Feeling of Stress : Only a little  Social Connections: Moderately  Integrated (11/12/2021)   Social Connection and Isolation Panel [NHANES]    Frequency of Communication with Friends  and Family: More than three times a week    Frequency of Social Gatherings with Friends and Family: More than three times a week    Attends Religious Services: 1 to 4 times per year    Active Member of [employer] or Organizations: No    Attends Engineer, structural: Never    Marital Status: Married    Tobacco Counseling Counseling given: Not Answered Tobacco comments: quit smoking 1990   Clinical Intake:  Pre-visit preparation completed: Yes  Pain : No/denies pain     BMI - recorded: 38.79 Nutritional Status: BMI > 30  Obese Nutritional Risks: None Diabetes: No  How often do you need to have someone help you when you read instructions, pamphlets, or other written materials from your doctor or pharmacy?: 1 - Never  Diabetic?no  Interpreter Needed?: No  Information entered by :: Lanier Ensign, LPN   Activities of Daily Living    11/12/2021   11:49 AM  In your present state of health, do you have any difficulty performing the following activities:  Hearing? 0  Vision? 0  Difficulty concentrating or making decisions? 0  Walking or climbing stairs? 0  Dressing or bathing? 0  Doing errands, shopping? 0  Preparing Food and eating ? N  Using the Toilet? N  In the past six months, have you accidently leaked urine? N  Do you have problems with loss of bowel control? N  Managing your Medications? N  Managing your Finances? N  Housekeeping or managing your Housekeeping? N    Patient Care Team: Willow Ora, MD as PCP - General (Family Medicine) Marcene Corning, MD as Consulting Physician (Orthopedic Surgery) Barnett Abu, MD as Consulting Physician (Neurosurgery) Jethro Bolus, MD (Inactive) as Consulting Physician (Urology) [place], Peter M, MD as Consulting Physician (Cardiology) Jesusita Oka, MD (Dermatology) Silverio Lay, MD as Consulting Physician (Obstetrics and Gynecology) Iva Boop, MD as Consulting Physician  (Gastroenterology) Marinus Maw, MD as Consulting Physician (Cardiology)  Indicate any recent Medical Services you may have received from other than [facility] providers in the past year (date may be approximate).     Assessment:   This is a routine wellness examination for Kristine Curtis.  Hearing/Vision screen Hearing Screening - Comments:: Pt denies any hearing issues  Vision Screening - Comments:: Pt follows up with Dr Shawna Orleans for annual eye exams   Dietary issues and exercise activities discussed: Current Exercise Habits: Home exercise routine, Type of exercise: Other - see comments, Time (Minutes): 20, Frequency (Times/Week): 2, Weekly Exercise (Minutes/Week): 40   Goals Addressed             This Visit's Progress    Patient Stated       Lose weight        Depression Screen    11/12/2021   11:43 AM 02/01/2021    9:05 AM 07/31/2020    8:37 AM 01/31/2020    8:42 AM 12/06/2019   11:50 AM 06/21/2019   10:44 AM 02/03/2019    1:15 PM  PHQ 2/9 Scores  PHQ - 2 Score 0 0 0 0 0 4 0  PHQ- 9 Score  3 9   11 2   Exception Documentation      Medical reason     Fall Risk    11/12/2021   11:49 AM 12/06/2019   11:54 AM 02/10/2018    9:06  AM 11/10/2017    8:07 AM 08/11/2017    8:08 AM  Fall Risk   Falls in the past year? 0 0 No No No  Number falls in past yr: 0 0     Injury with Fall? 0 0     Risk for fall due to : Impaired vision Impaired vision     Follow up Falls prevention discussed Falls prevention discussed       FALL RISK PREVENTION PERTAINING TO THE HOME:  Any stairs in or around the home? Yes  If so, are there any without handrails? No  Home free of loose throw rugs in walkways, pet beds, electrical cords, etc? Yes  Adequate lighting in your home to reduce risk of falls? Yes   ASSISTIVE DEVICES UTILIZED TO PREVENT FALLS:  Life alert? No  Use of a cane, walker or w/c? No  Grab bars in the bathroom? No  Shower chair or bench in shower? No  Elevated toilet seat or a  handicapped toilet? No   TIMED UP AND GO:  Was the test performed? No .   Cognitive Function:        11/12/2021   11:52 AM 12/06/2019   11:54 AM  6CIT Screen  What Year? 0 points 0 points  What month? 0 points 0 points  What time? 0 points 0 points  Count back from 20 0 points 0 points  Months in reverse 0 points 0 points  Repeat phrase 0 points 4 points  Total Score 0 points 4 points    Immunizations Immunization History  Administered Date(s) Administered   Influenza,inj,Quad PF,6+ Mos 02/08/2015, 02/10/2018, 02/03/2019, 01/31/2020, 02/01/2021   Tdap 02/08/2015    TDAP status: Up to date  Flu Vaccine status: Up to date    Covid-19 vaccine status: Declined, Education has been provided regarding the importance of this vaccine but patient still declined. Advised may receive this vaccine at local pharmacy or Health Dept.or vaccine clinic. Aware to provide a copy of the vaccination record if obtained from local pharmacy or Health Dept. Verbalized acceptance and understanding.  Qualifies for Shingles Vaccine? Yes   Zostavax completed No   Shingrix Completed?: No.    Education has been provided regarding the importance of this vaccine. Patient has been advised to call insurance company to determine out of pocket expense if they have not yet received this vaccine. Advised may also receive vaccine at local pharmacy or Health Dept. Verbalized acceptance and understanding.  Screening Tests Health Maintenance  Topic Date Due   Zoster Vaccines- Shingrix (1 of 2) Never done   INFLUENZA VACCINE  12/04/2021   MAMMOGRAM  01/04/2022   TETANUS/TDAP  02/07/2025   COLONOSCOPY (Pts 45-6yrs Insurance coverage will need to be confirmed)  06/28/2029   Hepatitis C Screening  Completed   HIV Screening  Completed   HPV VACCINES  Aged Out   COVID-19 Vaccine  Discontinued    Health Maintenance  Health Maintenance Due  Topic Date Due   Zoster Vaccines- Shingrix (1 of 2) Never done     Colorectal cancer screening: Type of screening: Colonoscopy. Completed 06/29/19. Repeat every 10 years  Mammogram status: Completed 01/04/21. Repeat every year     Additional Screening:  Hepatitis C Screening: Completed 06/02/14  Vision Screening: Recommended annual ophthalmology exams for early detection of glaucoma and other disorders of the eye. Is the patient up to date with their annual eye exam?  Yes  Who is the provider or what is the name  of the office in which the patient attends annual eye exams? Dr Jimmey Ralph  If pt is not established with a provider, would they like to be referred to a provider to establish care? No .   Dental Screening: Recommended annual dental exams for proper oral hygiene  Community Resource Referral / Chronic Care Management: CRR required this visit?  No   CCM required this visit?  No      Plan:     I have personally reviewed and noted the following in the patient's chart:   Medical and social history Use of alcohol, tobacco or illicit drugs  Current medications and supplements including opioid prescriptions.  Functional ability and status Nutritional status Physical activity Advanced directives List of other physicians Hospitalizations, surgeries, and ER visits in previous 12 months Vitals Screenings to include cognitive, depression, and falls Referrals and appointments  In addition, I have reviewed and discussed with patient certain preventive protocols, quality metrics, and best practice recommendations. A written personalized care plan for preventive services as well as general preventive health recommendations were provided to patient.     Marzella Schlein, LPN   [id number]/27/2536   Nurse Notes: None

## 2021-11-12 NOTE — Patient Instructions (Signed)
Ms. Kristine Curtis , Thank you for taking time to come for your Medicare Wellness Visit. I appreciate your ongoing commitment to your health goals. Please review the following plan we discussed and let me know if I can assist you in the future.   Screening recommendations/referrals: Colonoscopy: Done 06/29/19 repeat every 10 years  Mammogram: Done 01/04/21 repeat every year  Recommended yearly ophthalmology/optometry visit for glaucoma screening and checkup Recommended yearly dental visit for hygiene and checkup  Vaccinations: Influenza vaccine: done 02/01/21 repeat every year  Tdap vaccine: done 02/08/15  Shingles vaccine: Shingrix discussed. Please contact your pharmacy for coverage information.   Covid-19: Declined   Advanced directives: Please bring a copy of your health care power of attorney and living will to the office at your convenience.  Conditions/risks identified: Lose weight   Next appointment: Follow up in one year for your annual wellness visit.   Preventive Care 40-64 Years, Female Preventive care refers to lifestyle choices and visits with your health care provider that can promote health and wellness. What does preventive care include? A yearly physical exam. This is also called an annual well check. Dental exams once or twice a year. Routine eye exams. Ask your health care provider how often you should have your eyes checked. Personal lifestyle choices, including: Daily care of your teeth and gums. Regular physical activity. Eating a healthy diet. Avoiding tobacco and drug use. Limiting alcohol use. Practicing safe sex. Taking low-dose aspirin daily starting at age 45. Taking vitamin and mineral supplements as recommended by your health care provider. What happens during an annual well check? The services and screenings done by your health care provider during your annual well check will depend on your age, overall health, lifestyle risk factors, and family history of  disease. Counseling  Your health care provider may ask you questions about your: Alcohol use. Tobacco use. Drug use. Emotional well-being. Home and relationship well-being. Sexual activity. Eating habits. Work and work Statistician. Method of birth control. Menstrual cycle. Pregnancy history. Screening  You may have the following tests or measurements: Height, weight, and BMI. Blood pressure. Lipid and cholesterol levels. These may be checked every 5 years, or more frequently if you are over 23 years old. Skin check. Lung cancer screening. You may have this screening every year starting at age 30 if you have a 30-pack-year history of smoking and currently smoke or have quit within the past 15 years. Fecal occult blood test (FOBT) of the stool. You may have this test every year starting at age 13. Flexible sigmoidoscopy or colonoscopy. You may have a sigmoidoscopy every 5 years or a colonoscopy every 10 years starting at age 6. Hepatitis C blood test. Hepatitis B blood test. Sexually transmitted disease (STD) testing. Diabetes screening. This is done by checking your blood sugar (glucose) after you have not eaten for a while (fasting). You may have this done every 1-3 years. Mammogram. This may be done every 1-2 years. Talk to your health care provider about when you should start having regular mammograms. This may depend on whether you have a family history of breast cancer. BRCA-related cancer screening. This may be done if you have a family history of breast, ovarian, tubal, or peritoneal cancers. Pelvic exam and Pap test. This may be done every 3 years starting at age 45. Starting at age 46, this may be done every 5 years if you have a Pap test in combination with an HPV test. Bone density scan. This is done to  screen for osteoporosis. You may have this scan if you are at high risk for osteoporosis. Discuss your test results, treatment options, and if necessary, the need for more  tests with your health care provider. Vaccines  Your health care provider may recommend certain vaccines, such as: Influenza vaccine. This is recommended every year. Tetanus, diphtheria, and acellular pertussis (Tdap, Td) vaccine. You may need a Td booster every 10 years. Zoster vaccine. You may need this after age 17. Pneumococcal 13-valent conjugate (PCV13) vaccine. You may need this if you have certain conditions and were not previously vaccinated. Pneumococcal polysaccharide (PPSV23) vaccine. You may need one or two doses if you smoke cigarettes or if you have certain conditions. Talk to your health care provider about which screenings and vaccines you need and how often you need them. This information is not intended to replace advice given to you by your health care provider. Make sure you discuss any questions you have with your health care provider. Document Released: 05/19/2015 Document Revised: 01/10/2016 Document Reviewed: 02/21/2015 Elsevier Interactive Patient Education  2017 [employer] Prevention in the Home Falls can cause injuries. They can happen to people of all ages. There are many things you can do to make your home safe and to help prevent falls. What can I do on the outside of my home? Regularly fix the edges of walkways and driveways and fix any cracks. Remove anything that might make you trip as you walk through a door, such as a raised step or threshold. Trim any bushes or trees on the path to your home. Use bright outdoor lighting. Clear any walking paths of anything that might make someone trip, such as rocks or tools. Regularly check to see if handrails are loose or broken. Make sure that both sides of any steps have handrails. Any raised decks and porches should have guardrails on the edges. Have any leaves, snow, or ice cleared regularly. Use sand or salt on walking paths during winter. Clean up any spills in your garage right away. This includes  oil or grease spills. What can I do in the bathroom? Use night lights. Install grab bars by the toilet and in the tub and shower. Do not use towel bars as grab bars. Use non-skid mats or decals in the tub or shower. If you need to sit down in the shower, use a plastic, non-slip stool. Keep the floor dry. Clean up any water that spills on the floor as soon as it happens. Remove soap buildup in the tub or shower regularly. Attach bath mats securely with double-sided non-slip rug tape. Do not have throw rugs and other things on the floor that can make you trip. What can I do in the bedroom? Use night lights. Make sure that you have a light by your bed that is easy to reach. Do not use any sheets or blankets that are too big for your bed. They should not hang down onto the floor. Have a firm chair that has side arms. You can use this for support while you get dressed. Do not have throw rugs and other things on the floor that can make you trip. What can I do in the kitchen? Clean up any spills right away. Avoid walking on wet floors. Keep items that you use a lot in easy-to-reach places. If you need to reach something above you, use a strong step stool that has a grab bar. Keep electrical cords out of the way.  Do not use floor polish or wax that makes floors slippery. If you must use wax, use non-skid floor wax. Do not have throw rugs and other things on the floor that can make you trip. What can I do with my stairs? Do not leave any items on the stairs. Make sure that there are handrails on both sides of the stairs and use them. Fix handrails that are broken or loose. Make sure that handrails are as long as the stairways. Check any carpeting to make sure that it is firmly attached to the stairs. Fix any carpet that is loose or worn. Avoid having throw rugs at the top or bottom of the stairs. If you do have throw rugs, attach them to the floor with carpet tape. Make sure that you have a light  switch at the top of the stairs and the bottom of the stairs. If you do not have them, ask someone to add them for you. What else can I do to help prevent falls? Wear shoes that: Do not have high heels. Have rubber bottoms. Are comfortable and fit you well. Are closed at the toe. Do not wear sandals. If you use a stepladder: Make sure that it is fully opened. Do not climb a closed stepladder. Make sure that both sides of the stepladder are locked into place. Ask someone to hold it for you, if possible. Clearly mark and make sure that you can see: Any grab bars or handrails. First and last steps. Where the edge of each step is. Use tools that help you move around (mobility aids) if they are needed. These include: Canes. Walkers. Scooters. Crutches. Turn on the lights when you go into a dark area. Replace any light bulbs as soon as they burn out. Set up your furniture so you have a clear path. Avoid moving your furniture around. If any of your floors are uneven, fix them. If there are any pets around you, be aware of where they are. Review your medicines with your doctor. Some medicines can make you feel dizzy. This can increase your chance of falling. Ask your doctor what other things that you can do to help prevent falls. This information is not intended to replace advice given to you by your health care provider. Make sure you discuss any questions you have with your health care provider. Document Released: 02/16/2009 Document Revised: 09/28/2015 Document Reviewed: 05/27/2014 Elsevier Interactive Patient Education  2017 [employer].

## 2021-12-04 ENCOUNTER — Encounter: Payer: Self-pay | Admitting: Family Medicine

## 2021-12-05 ENCOUNTER — Ambulatory Visit (INDEPENDENT_AMBULATORY_CARE_PROVIDER_SITE_OTHER): Payer: Medicare Other | Admitting: Physician Assistant

## 2021-12-05 ENCOUNTER — Encounter: Payer: Self-pay | Admitting: Physician Assistant

## 2021-12-05 VITALS — BP 110/70 | HR 87 | Temp 97.9°F | Ht 63.0 in | Wt 201.0 lb

## 2021-12-05 DIAGNOSIS — R1110 Vomiting, unspecified: Secondary | ICD-10-CM

## 2021-12-05 DIAGNOSIS — R109 Unspecified abdominal pain: Secondary | ICD-10-CM | POA: Diagnosis not present

## 2021-12-05 DIAGNOSIS — R197 Diarrhea, unspecified: Secondary | ICD-10-CM | POA: Diagnosis not present

## 2021-12-05 DIAGNOSIS — E669 Obesity, unspecified: Secondary | ICD-10-CM | POA: Diagnosis not present

## 2021-12-05 NOTE — Progress Notes (Signed)
Kristine Curtis is a 61 y.o. female here for a follow up of a pre-existing problem.  History of Present Illness:   Chief Complaint  Patient presents with   f/u Tick bite    Pt says she was bit by a tick in May. Pt says she has been having GI and have worsened since Tick bite.    HPI  Tick Bite Patient complains of tick bite about 4 months ago. She has been dealing with some GI issues which seems to be worsening since tick bite. States she has noticed tick bite on May 17th which has been there for a while - it was near left axilla.  Per pt, she was able to removed this with her husband's help. She states she was having issue with diarrhea and vomiting after eating food.  Comes and goes. States no matter what she eats, she feel increased burning and bloating. Feels like this started to get worse since the tick bite. States she has tried eating shrimp and fish but was unable to tolerate this. Has done some home food testing and states she was unable to tolerate some foods. She does admit that she also started her Mounjaro at this time as well. Denies fever or chills. Denies nausea, headaches or rash.   Per pt, she was having issue with choking and gagging on her excessive mucous production. However, after stopping eating acidic tomatoes this resolved.  She stopped all sodas since then as well and reports that she was exclusively drinking sodas prior to stopping. Her symptoms have resolved. She does have hx of IBS and GERD.   Obesity  Patient is concerned about her weight loss since May. She states she started Mounjaro 2.5 mg once weekly during that time as well. She is not sure if she experiencing diarrhea due to [facility]. She thinks she losing weight due to unable to eat certain foods. However, she also have been on Mounjaro for past 3 months. Has had about 20 lb weight loss since then.  Has tried Saxenda in the past but this made her constipated and depressed. Denies any other concerns.  Past  Medical History:  Diagnosis Date   Allergic rhinitis due to pollen    Anemia    Anxiety    Asthma    Carpal tunnel syndrome of right wrist 12/22/2017   Cataract    Chronic combined systolic and diastolic CHF (congestive heart failure) (HCC)    06/2016 Echo: EF 40-45%, Gr1 DD   Chronic fatigue    Depression    Family history of polycystic kidney with negative CT 11/16/2012   Fibromyalgia    GERD (gastroesophageal reflux disease)    Helicobacter pylori gastritis 05/2020   Hypertension    Hypothyroidism    IBS (irritable bowel syndrome)    Internal hemorrhoids    Lymphocytic colitis    Morbid obesity (HCC)    NICM (nonischemic cardiomyopathy) (HCC)    a. 1999 Cath: nl cors;  b. EF prev as low as 35%;  c. 11/2015 Echo: Ef 40-45%;  d. 06/2016 Echo: EF 40-45%;  e. Lexiscan MV: fixed anterosepta/inferseptal defect w/o ischemia, EF 35%.   Osteoarthritis    PONV (postoperative nausea and vomiting)    PVC (premature ventricular contraction)    Restless leg syndrome    Sjogren's syndrome (HCC)    Vertigo      Social History   Tobacco Use   Smoking status: Former    Packs/day: 2.00    Years: 12.00  Total pack years: 24.00    Types: Cigarettes    Quit date: 05/06/1989    Years since quitting: 32.6   Smokeless tobacco: Never   Tobacco comments:    quit smoking 1990  Vaping Use   Vaping Use: Never used  Substance Use Topics   Alcohol use: No    Alcohol/week: 0.0 standard drinks of alcohol   Drug use: No    Past Surgical History:  Procedure Laterality Date   ABDOMINAL HERNIA REPAIR     ABDOMINAL HYSTERECTOMY     CESAREAN SECTION     CHOLECYSTECTOMY  10/2008   COLONOSCOPY  02/14/2015   ESOPHAGOGASTRODUODENOSCOPY     EYE SURGERY     2 2013 and 1981/DCR of right eye 05/2014   LUMBAR DISC SURGERY  2016   L3-4   RIGHT/LEFT HEART CATH AND CORONARY ANGIOGRAPHY N/A 07/11/2016   Procedure: Right/Left Heart Cath and Coronary Angiography;  Surgeon: Peter M Swaziland, MD;  Location: [facility]  INVASIVE CV LAB;  Service: Cardiovascular;  Laterality: N/A;   SHOULDER ARTHROSCOPY W/ SUBACROMIAL DECOMPRESSION AND DISTAL CLAVICLE EXCISION Right    SPINE SURGERY     C6-C7, 03/2017   TUBAL LIGATION  1992    Family History  Problem Relation Age of Onset   Pancreatic cancer Mother    Thyroid cancer Mother    Depression Mother    Obesity Mother    Diabetes Father    Heart disease Father    Glaucoma Father    High blood pressure Father    Thyroid cancer Father    Alcoholism Father    Obesity Father    Kidney failure Brother    Colon cancer Neg Hx    Stomach cancer Neg Hx    Esophageal cancer Neg Hx    Colon polyps Neg Hx    Rectal cancer Neg Hx     Allergies  Allergen Reactions   Gabapentin (Once-Daily)     Depression    Saxenda [Liraglutide -Weight Management] Other (See Comments)    Depression    Sulfa Antibiotics     itching   Trintellix [Vortioxetine] Other (See Comments)    GI issues    Adhesive [Tape] Rash   Codeine Itching    Current Medications:   Current Outpatient Medications:    albuterol (VENTOLIN HFA) 108 (90 Base) MCG/ACT inhaler, Inhale 2 puffs into the lungs every 6 (six) hours as needed for wheezing or shortness of breath., Disp: 8 g, Rfl: 3   aspirin-acetaminophen-caffeine (HEADACHE RELIEF) 250-250-65 MG tablet, Take by mouth every 6 (six) hours as needed for headache., Disp: , Rfl:    Atogepant (QULIPTA PO), Take by mouth., Disp: , Rfl:    Bacillus Coagulans-Inulin (PROBIOTIC-PREBIOTIC) 1-250 BILLION-MG CAPS, Take by mouth., Disp: , Rfl:    buPROPion (WELLBUTRIN XL) 300 MG 24 hr tablet, TAKE 1 TABLET BY MOUTH DAILY, Disp: 30 tablet, Rfl: 11   carvedilol (COREG) 12.5 MG tablet, TAKE ONE TABLET TWICE A DAY WITH MEALS, Disp: 180 tablet, Rfl: 3   cetirizine (ZYRTEC) 10 MG tablet, Take 10 mg by mouth daily., Disp: , Rfl:    Cholecalciferol (VITAMIN D-3 PO), Take 2,000 Units by mouth daily., Disp: , Rfl:    Dextrose-Fructose-Sod Citrate (NAUZENE)  312 081 2644 MG CHEW, Chew by mouth., Disp: , Rfl:    diazepam (VALIUM) 5 MG tablet, TAKE ONE TABLET BY MOUTH EVERY DAY AS NEEDED FOR ANXIETY, Disp: 30 tablet, Rfl: 5   ESTRADIOL PO, Take by mouth., Disp: , Rfl:  fluticasone (FLONASE) 50 MCG/ACT nasal spray, Place 1 spray into both nostrils daily., Disp: , Rfl:    fluticasone-salmeterol (ADVAIR) 100-50 MCG/ACT AEPB, Inhale 1 puff into the lungs 2 (two) times daily., Disp: 1 each, Rfl: 5   furosemide (LASIX) 20 MG tablet, Take 1 tablet (20 mg total) by mouth daily as needed for edema., Disp: 90 tablet, Rfl: 3   Galcanezumab-gnlm 120 MG/ML SOAJ, Inject 120 mg into the skin every 30 (thirty) days., Disp: , Rfl:    glucose blood (ONETOUCH ULTRA) test strip, Use to check blood sugar TID, Disp: 100 each, Rfl: 0   hydroxychloroquine (PLAQUENIL) 200 MG tablet, Take 2 tablets (400 mg total) by mouth every morning., Disp: 90 tablet, Rfl: 2   JARDIANCE 10 MG TABS tablet, TAKE ONE TABLET EACH MORNING BEFORE BREAKFAST, Disp: 90 tablet, Rfl: 3   Lancets (ONETOUCH ULTRASOFT) lancets, Use to check blood sugar TID, Disp: 100 each, Rfl: 0   levothyroxine (SYNTHROID) 100 MCG tablet, TAKE ONE TABLET EVERY DAY, Disp: 90 tablet, Rfl: 3   losartan (COZAAR) 50 MG tablet, Take 1 tablet (50 mg total) by mouth daily., Disp: 90 tablet, Rfl: 3   Meclizine HCl 25 MG CHEW, Chew by mouth daily as needed., Disp: , Rfl:    metoCLOPramide (REGLAN) 5 MG tablet, Take 1 tablet (5 mg total) by mouth every 6 (six) hours as needed for nausea., Disp: 60 tablet, Rfl: 0   nystatin-triamcinolone ointment (MYCOLOG), Apply 1 application topically 2 (two) times daily as needed., Disp: 60 g, Rfl: 2   omeprazole (PRILOSEC) 40 MG capsule, Take 1 capsule (40 mg total) by mouth daily before breakfast., Disp: 90 capsule, Rfl: 3   ondansetron (ZOFRAN ODT) 4 MG disintegrating tablet, Take 1 tablet (4 mg total) by mouth every 8 (eight) hours as needed for nausea or vomiting., Disp: 24 tablet, Rfl: 0    OVER THE COUNTER MEDICATION, Take 5 capsules by mouth daily. [employer]ix Optimals Vitamin & Minerals, Disp: , Rfl:    OVER THE COUNTER MEDICATION, Take 1 scoop by mouth daily. Ariix Magnecal D, Disp: , Rfl:    oxyCODONE-acetaminophen (PERCOCET) 10-325 MG tablet, Take 1 tablet by mouth daily as needed for pain., Disp: 30 tablet, Rfl: 0   Peak Flow Meter DEVI, Use as needed to check your breathing. Log your values, Disp: 1 each, Rfl: 0   rizatriptan (MAXALT) 10 MG tablet, Take 10 mg by mouth as needed for migraine. May repeat in 2 hours if needed, Disp: , Rfl:    spironolactone (ALDACTONE) 25 MG tablet, TAKE ONE TABLET EVERY DAY, Disp: 90 tablet, Rfl: 3   tirzepatide (MOUNJARO) 2.5 MG/0.5ML Pen, Inject 2.5 mg as directed once a week, Disp: , Rfl:    topiramate (TOPAMAX) 25 MG tablet, Take 1 tablet (25 mg total) by mouth 2 (two) times daily., Disp: 60 tablet, Rfl: 3   TYRVAYA 0.03 MG/ACT SOLN, Place into both nostrils., Disp: , Rfl:    UNABLE TO FIND, Med Name: Nutrifil: Optimal-V, Vinali, Optimal-M, Magnical-D, Disp: , Rfl:    Wheat Dextrin (BENEFIBER) POWD, Take 2 scoop by mouth daily as needed (for constipation). , Disp: , Rfl:    zinc gluconate 50 MG tablet, Take 50 mg by mouth daily., Disp: , Rfl:    Review of Systems:   ROS Negative unless otherwise specified per HPI.   Vitals:   Vitals:   12/05/21 1447  BP: 110/70  Pulse: 87  Temp: 97.9 F (36.6 C)  TempSrc: Temporal  SpO2: 97%  Weight: 201 lb (91.2 kg)  Height: 5\' 3"  (1.6 m)     Body mass index is 35.61 kg/m.  Physical Exam:   Physical Exam Vitals and nursing note reviewed.  Constitutional:      General: She is not in acute distress.    Appearance: She is well-developed. She is not ill-appearing or toxic-appearing.  Cardiovascular:     Rate and Rhythm: Normal rate and regular rhythm.     Pulses: Normal pulses.     Heart sounds: Normal heart sounds, S1 normal and S2 normal.  Pulmonary:     Effort: Pulmonary effort is  normal.     Breath sounds: Normal breath sounds.  Abdominal:     General: Abdomen is flat. Bowel sounds are normal.     Palpations: Abdomen is soft.     Tenderness: There is no abdominal tenderness.  Skin:    General: Skin is warm and dry.  Neurological:     Mental Status: She is alert.     GCS: GCS eye subscore is 4. GCS verbal subscore is 5. GCS motor subscore is 6.  Psychiatric:        Speech: Speech normal.        Behavior: Behavior normal. Behavior is cooperative.     Assessment and Plan:   Abdominal pain, vomiting, and diarrhea Uncontrolled No red flags; no acute abdomen We discussed obtaining blood work to evaluate electrolytes but she reports that she is overall well hydrated and denies concerns for significant dehydration and denies lightheadedness/dizziness/etc Recommended holding Mounjaro 2.5 mg weekly x 2 weeks and follow-up with Dr who prescribes this in order to see if this is contributing to sx If sx do not improve, she may pursue Alpha-Gal testing here -- I have placed future orders Consider referral to Allergy or Dietitian -- will defer to PCP Recommend f/u with Earlene Plater with GI or PCP if sx worsen/persist  Obesity, unspecified classification, unspecified obesity type, unspecified whether serious comorbidity present Improving with weight loss however GI issues may be coming from [facility] Recommend close follow-up with [facility] for ongoing management  I,Savera Zaman,acting as a scribe for Earlene Plater, PA.,have documented all relevant documentation on the behalf of Jarold Motto, PA,as directed by  Jarold Motto, PA while in the presence of Jarold Motto, Jarold Motto.   I, [place], Jarold Motto, have reviewed all documentation for this visit. The documentation on 12/05/21 for the exam, diagnosis, procedures, and orders are all accurate and complete.  Time spent with patient today was 30 minutes which consisted of chart review, discussing diagnosis, work up,  treatment answering questions and documentation.   02/04/22, PA-C

## 2021-12-05 NOTE — Patient Instructions (Addendum)
It was great to see you!  Hold your mounjaro for 2 weeks and follow-up with Earlene Plater about this  If your symptoms do not improve, schedule a lab-only appointment to have the Alpha-Gal test done. Depending on those results we can send you to a dietitian or back to Dr. Leone Payor  Please follow-up with Dr. Mardelle Matte if you have any further concerns regarding this issue.  Lelon Mast

## 2021-12-10 ENCOUNTER — Other Ambulatory Visit: Payer: Medicare Other

## 2021-12-10 DIAGNOSIS — R109 Unspecified abdominal pain: Secondary | ICD-10-CM

## 2021-12-12 ENCOUNTER — Encounter: Payer: Self-pay | Admitting: Physician Assistant

## 2021-12-12 ENCOUNTER — Encounter (INDEPENDENT_AMBULATORY_CARE_PROVIDER_SITE_OTHER): Payer: Self-pay

## 2021-12-14 LAB — ALPHA-GAL PANEL
Allergen, Mutton, f88: <0.1 kU/L
Allergen, Pork, f26: <0.1 kU/L
Beef: <0.1 kU/L
CLASS: 0
CLASS: 0
Class: 0
GALACTOSE-ALPHA-1,3-GALACTOSE IGE*: <0.1 kU/L (ref <0.10)

## 2021-12-14 LAB — INTERPRETATION:

## 2022-01-28 ENCOUNTER — Encounter: Payer: Self-pay | Admitting: *Deleted

## 2022-01-29 DIAGNOSIS — Z789 Other specified health status: Secondary | ICD-10-CM

## 2022-01-29 NOTE — Progress Notes (Signed)
[facility])                                            Rayle Team                                        Statin Quality Measure Assessment    01/29/2022  CARIANNE TAIRA 1961-03-01 [id number]  Per review of chart and payor information, this patient has been flagged for non-adherence to the following CMS Quality Measure:   [x]  Statin Use in Persons with Diabetes --- Ms. Haggard has a h/o statin intolerance. CPT code G72.0 (statin induced myopathy) was associated to an office last year to close SUPD measure. No statin/antihyperlipidemic on file. Next appointment with PCP is on 02/04/2022. If deemed clinically appropriate, please consider associating an exclusion code (see options below) during next office visit or consider statin re-challenge.   []  Statin Use in Persons with Cardiovascular Disease  The 10-year ASCVD risk score (Arnett DK, et al., 2019) is: 6.3%   Values used to calculate the score:     Age: 61 years     Sex: Female     Is Non-Hispanic African American: No     Diabetic: Yes     Tobacco smoker: No     Systolic Blood Pressure: 935 mmHg     Is BP treated: Yes     HDL Cholesterol: 51.5 mg/dL     Total Cholesterol: 164 mg/dL   Please consider ONE of the following recommendations:   Initiate high intensity statin Atorvastatin 40mg  once daily, #90, 3 refills   Rosuvastatin 20mg  once daily, #90, 3 refills    Initiate moderate intensity          statin with reduced frequency if prior          statin intolerance 1x weekly, #13, 3 refills   2x weekly, #26, 3 refills   3x weekly, #39, 3 refills   Code for past statin intolerance or other exclusions (required annually)  Drug Induced Myopathy G72.0   Myositis, unspecified M60.9   Rhabdomyolysis M62.82   Cirrhosis of liver K74.69   Biliary cirrhosis, unspecified K74.5   Abnormal blood glucose - for SUPD ONLY R73.09   Prediabetes - for SUPD ONLY   R73.03   Thank you for your time,  Kristeen Miss, [place] Cell: [phone]

## 2022-02-04 ENCOUNTER — Encounter: Payer: Medicare Other | Admitting: Family Medicine

## 2022-03-25 ENCOUNTER — Other Ambulatory Visit: Payer: Self-pay | Admitting: Cardiology

## 2022-03-25 ENCOUNTER — Other Ambulatory Visit: Payer: Self-pay | Admitting: Family Medicine

## 2022-04-18 ENCOUNTER — Encounter: Payer: Self-pay | Admitting: *Deleted

## 2022-07-27 NOTE — Progress Notes (Unsigned)
Cardiology Office Note    Date:  07/31/2022   ID:  Kristine Curtis, DOB [birth year], MRN [id number]  PCP:  Eunice Blase, MD  Cardiologist:  Dr. Martinique   Chief Complaint  Patient presents with   Congestive Heart Failure    History of Present Illness:  Kristine Curtis is a 62 y.o. female with PMH of NICM, PVCs, HTN, fibromyalgia, morbid obesity, hypothyroidism, RLS and vertigo. She had a cardiac catheterization in 1999 that showed normal coronaries. EF previously as low as 35%, EF improved to 40-45% by 2017. Cardiac MRI in 2017 showed EF 44% with global hypokinesis, no scar or infiltration. She was admitted in February 2018 with 24 hour history of chest pain, dyspnea and headache. Cardiac enzyme were negative. Limited echocardiogram showed EF 40-45% without pericardial effusion. Stress test showed fixed anteroseptal and inferoseptal defect without ischemia. EF was measured 35%. Despite Myoview showing no ischemia, she continued to have exertional chest discomfort and subsequently underwent a left and right heart cath 07/11/2016 which showed normal coronaries. She also had normal cardiac output and EF 35-40%. She had a sleep study that showed in the past no obstructive sleep apnea. She does have restless leg syndrome  Patient was admitted in September 2018 with a episode of syncope.  Outpatient 30-day event monitor was negative for significant arrhythmia.  CHF therapy was titrated. Now on losartan, Coreg, aldactdone, and Jardiance. She developed a cough on Entresto. After adjustments Echo was repeated in April 2022 showing normalization of EF to 55%.   On follow up today she is doing well from a cardiac standpoint. She denies any swelling weight is down 15 lbs from a year ago.  Is followed now by Dr Junius Roads and labs in November were OK. No palpitations. Tolerating medication well. She does note come cough related to allergies.  She did try Mounjaro but was unable to tolerate due to GI side  effects. She is having stress with her father who has been ill.     Past Medical History:  Diagnosis Date   Allergic rhinitis due to pollen    Anemia    Anxiety    Asthma    Carpal tunnel syndrome of right wrist 12/22/2017   Cataract    Chronic combined systolic and diastolic CHF (congestive heart failure) ([place])    06/2016 Echo: EF 40-45%, Gr1 DD   Chronic fatigue    Depression    Family history of polycystic kidney with negative CT 11/16/2012   Fibromyalgia    GERD (gastroesophageal reflux disease)    Helicobacter pylori gastritis 05/2020   Hypertension    Hypothyroidism    IBS (irritable bowel syndrome)    Internal hemorrhoids    Lymphocytic colitis    Morbid obesity ([place])    NICM (nonischemic cardiomyopathy) ([place])    a. 1999 Cath: nl cors;  b. EF prev as low as 35%;  c. 11/2015 Echo: Ef 40-45%;  d. 06/2016 Echo: EF 40-45%;  e. Lexiscan MV: fixed anterosepta/inferseptal defect w/o ischemia, EF 35%.   Osteoarthritis    PONV (postoperative nausea and vomiting)    PVC (premature ventricular contraction)    Restless leg syndrome    Sjogren's syndrome ([place])    Vertigo     Past Surgical History:  Procedure Laterality Date   ABDOMINAL HERNIA REPAIR     ABDOMINAL HYSTERECTOMY     CESAREAN SECTION     CHOLECYSTECTOMY  10/2008   COLONOSCOPY  02/14/2015   ESOPHAGOGASTRODUODENOSCOPY  EYE SURGERY     2 2013 and 1981/DCR of right eye 05/2014   LUMBAR [place] SURGERY  2016   L3-4   RIGHT/LEFT HEART CATH AND CORONARY ANGIOGRAPHY N/A 07/11/2016   Procedure: Right/Left Heart Cath and Coronary Angiography;  Surgeon: Alianna Wurster M Martinique, MD;  Location: [facility];  Service: Cardiovascular;  Laterality: N/A;   SHOULDER ARTHROSCOPY W/ SUBACROMIAL DECOMPRESSION AND DISTAL CLAVICLE EXCISION Right    SPINE SURGERY     C6-C7, 03/2017   TUBAL LIGATION  1992    Current Medications: Outpatient Medications Prior to Visit  Medication Sig Dispense Refill   albuterol (VENTOLIN HFA) 108 (90  Base) MCG/ACT inhaler Inhale 2 puffs into the lungs every 6 (six) hours as needed for wheezing or shortness of breath. 8 g 3   Alpha-Lipoic Acid 600 MG CAPS 1 capsule Orally Once a day     aspirin-acetaminophen-caffeine (HEADACHE RELIEF) 250-250-65 MG tablet Take by mouth every 6 (six) hours as needed for headache.     Atogepant (QULIPTA PO) Take by mouth.     Bacillus Coagulans-Inulin (PROBIOTIC-PREBIOTIC) 1-250 BILLION-MG CAPS Take by mouth.     buPROPion (WELLBUTRIN XL) 300 MG 24 hr tablet TAKE 1 TABLET BY MOUTH DAILY 30 tablet 11   carvedilol (COREG) 12.5 MG tablet TAKE ONE TABLET TWICE A DAY WITH MEALS 180 tablet 3   cetirizine (ZYRTEC) 10 MG tablet Take 10 mg by mouth daily.     Cholecalciferol (VITAMIN D-3 PO) Take 2,000 Units by mouth daily.     Dextrose-Fructose-Sod Citrate (NAUZENE) [phone] MG CHEW Chew by mouth.     diazepam (VALIUM) 5 MG tablet TAKE ONE TABLET BY MOUTH EVERY DAY AS NEEDED FOR ANXIETY 30 tablet 5   ESTRADIOL PO Take by mouth.     fluticasone (FLONASE) 50 MCG/ACT nasal spray Place 1 spray into both nostrils daily.     fluticasone-salmeterol (ADVAIR) 100-50 MCG/ACT AEPB Inhale 1 puff into the lungs 2 (two) times daily. 1 each 5   furosemide (LASIX) 20 MG tablet Take 1 tablet (20 mg total) by mouth daily as needed for edema. 90 tablet 3   glucose blood (ONETOUCH ULTRA) test strip Use to check blood sugar TID 100 each 0   hydroxychloroquine (PLAQUENIL) 200 MG tablet Take 2 tablets (400 mg total) by mouth every morning. 90 tablet 2   JARDIANCE 10 MG TABS tablet TAKE ONE TABLET EACH MORNING BEFORE BREAKFAST 90 tablet 3   Lancets (ONETOUCH ULTRASOFT) lancets Use to check blood sugar TID 100 each 0   levothyroxine (SYNTHROID) 100 MCG tablet TAKE ONE TABLET EVERY DAY 90 tablet 3   losartan (COZAAR) 50 MG tablet Take 1 tablet (50 mg total) by mouth daily. 90 tablet 3   Meclizine HCl 25 MG CHEW Chew by mouth daily as needed.     metoCLOPramide (REGLAN) 5 MG tablet Take 1  tablet (5 mg total) by mouth every 6 (six) hours as needed for nausea. 60 tablet 0   nystatin-triamcinolone ointment (MYCOLOG) Apply 1 application topically 2 (two) times daily as needed. 60 g 2   omeprazole (PRILOSEC) 40 MG capsule Take 1 capsule (40 mg total) by mouth daily before breakfast. 90 capsule 3   ondansetron (ZOFRAN ODT) 4 MG disintegrating tablet Take 1 tablet (4 mg total) by mouth every 8 (eight) hours as needed for nausea or vomiting. 24 tablet 0   OVER THE COUNTER MEDICATION Take 5 capsules by mouth daily. [employer]ix Optimals Vitamin & Minerals  OVER THE COUNTER MEDICATION Take 1 scoop by mouth daily. Ariix Magnecal D     oxyCODONE-acetaminophen (PERCOCET) 10-325 MG tablet Take 1 tablet by mouth daily as needed for pain. 30 tablet 0   Peak Flow Meter DEVI Use as needed to check your breathing. Log your values 1 each 0   rizatriptan (MAXALT) 10 MG tablet Take 10 mg by mouth as needed for migraine. May repeat in 2 hours if needed     spironolactone (ALDACTONE) 25 MG tablet TAKE ONE TABLET EVERY DAY 90 tablet 3   topiramate (TOPAMAX) 25 MG tablet Take 1 tablet (25 mg total) by mouth 2 (two) times daily. 60 tablet 3   TYRVAYA 0.03 MG/ACT SOLN Place into both nostrils.     UNABLE TO FIND Med Name: Nutrifil: Optimal-V, Vinali, Optimal-M, Magnical-D     Wheat Dextrin (BENEFIBER) POWD Take 2 scoop by mouth daily as needed (for constipation).      zinc gluconate 50 MG tablet Take 50 mg by mouth daily.     tirzepatide [facility]) 2.5 MG/0.5ML Pen Inject 2.5 mg as directed once a week     Galcanezumab-gnlm 120 MG/ML SOAJ Inject 120 mg into the skin every 30 (thirty) days.     No facility-administered medications prior to visit.     Allergies:   Gabapentin (once-daily), Saxenda [liraglutide -weight management], Sulfa antibiotics, Trintellix [vortioxetine], Adhesive [tape], and Codeine   Social History   Socioeconomic History   Marital status: Married    Spouse name: Elenore Rota   Number of  children: 2   Years of education: Not on file   Highest education level: Not on file  Occupational History   Occupation: Holiday representative   Occupation: disablity  Tobacco Use   Smoking status: Former    Packs/day: 2.00    Years: 12.00    Additional pack years: 0.00    Total pack years: 24.00    Types: Cigarettes    Quit date: 05/06/1989    Years since quitting: 33.2   Smokeless tobacco: Never   Tobacco comments:    quit smoking 1990  Vaping Use   Vaping Use: Never used  Substance and Sexual Activity   Alcohol use: No    Alcohol/week: 0.0 standard drinks of alcohol   Drug use: No   Sexual activity: Yes  Other Topics Concern   Not on file  Social History Narrative   Former Dr. Redmond Pulling then Dr. Juventino Slovak patient    Dr. Rosemary Holms, podiatrist   Social Determinants of Health   Financial Resource Strain: Low Risk  (11/12/2021)   Overall Financial Resource Strain (CARDIA)    Difficulty of Paying Living Expenses: Not hard at all  Food Insecurity: No Food Insecurity (11/12/2021)   Hunger Vital Sign    Worried About Running Out of Food in the Last Year: Never true    Ran Out of Food in the Last Year: Never true  Transportation Needs: No Transportation Needs (11/12/2021)   PRAPARE - Hydrologist (Medical): No    Lack of Transportation (Non-Medical): No  Physical Activity: Insufficiently Active (11/12/2021)   Exercise Vital Sign    Days of Exercise per Week: 2 days    Minutes of Exercise per Session: 20 min  Stress: No Stress Concern Present (11/12/2021)   [place]    Feeling of Stress : Only a little  Social Connections: Moderately Integrated (11/12/2021)   Social Connection and Isolation  Panel [NHANES]    Frequency of Communication with Friends and Family: More than three times a week    Frequency of Social Gatherings with Friends and Family: More than three times a week     Attends Religious Services: 1 to 4 times per year    Active Member of [employer] or Organizations: No    Attends Music therapist: Never    Marital Status: Married     Family History:  The patient's family history includes Alcoholism in her father; Depression in her mother; Diabetes in her father; Glaucoma in her father; Heart disease in her father; High blood pressure in her father; Kidney failure in her brother; Obesity in her father and mother; Pancreatic cancer in her mother; Thyroid cancer in her father and mother.   ROS:   Please see the history of present illness.    ROS All other systems reviewed and are negative.   PHYSICAL EXAM:   VS:  BP 102/66   Pulse 71   Ht 5\' 3"  (1.6 m)   Wt 202 lb 9.6 oz (91.9 kg)   SpO2 93%   BMI 35.89 kg/m    GEN: Well nourished, well developed, in no acute distress  HEENT: normal  Neck: no JVD, carotid bruits, or masses Cardiac: RRR; no murmurs, rubs, or gallops,no edema  Respiratory:  clear to auscultation bilaterally, normal work of breathing GI: soft, nontender, nondistended, + BS MS: no deformity or atrophy  Skin: warm and dry, no rash Neuro:  Alert and Oriented x 3, Strength and sensation are intact Psych: euthymic mood, full affect  Wt Readings from Last 3 Encounters:  07/31/22 202 lb 9.6 oz (91.9 kg)  12/05/21 201 lb (91.2 kg)  07/27/21 217 lb 3.2 oz (98.5 kg)      Studies/Labs Reviewed:   EKG:  EKG is  ordered today.  NSR rate 71. Normal.  I have personally reviewed and interpreted this study.   Recent Labs: No results found for requested labs within last 365 days.   Lipid Panel    Component Value Date/Time   CHOL 164 02/01/2021 0942   TRIG 109.0 02/01/2021 0942   HDL 51.50 02/01/2021 0942   CHOLHDL 3 02/01/2021 0942   VLDL 21.8 02/01/2021 0942   LDLCALC 91 02/01/2021 0942   LDLCALC 81 01/31/2020 0906   LDLDIRECT 75.0 03/20/2016 0836   Dated 03/07/22: cholesterol 159, triglycerides 77, LDL 92, HDL. 52, CRP  0.72. CBC and CMET normal. TFTS OK.  Additional studies/ records that were reviewed today include:   Echo 06/28/2016 LV EF: 40% -   45% Study Conclusions   - Left ventricle: The cavity size was mildly dilated. Wall   thickness was increased in a pattern of mild LVH. Systolic   function was mildly to moderately reduced. The estimated ejection   fraction was in the range of 40% to 45%. Diffuse hypokinesis.   Doppler parameters are consistent with abnormal left ventricular   relaxation (grade 1 diastolic dysfunction). - Left atrium: The atrium was mildly dilated.   Impressions:   - Limited study to assess LV function; full doppler study not   performed; mild to moderate global reduction in LV systolic   function; grade 1 diastolic dysfunction; mild LVH; mild LVE; mild   LAE.   Cath 07/11/2016 Conclusion      There is moderate left ventricular systolic dysfunction. The left ventricular ejection fraction is 35-45% by visual estimate. LV end diastolic pressure is mildly elevated.   1.  Normal coronary anatomy 2. Moderate LV dysfunction- global. EF 35-40%.  3. Mildly elevated LVEDP 4. Normal pulmonary pressures. RA pressure is elevated. 5. Normal cardiac output.   Plan: continue medical therapy. Consider addition of aldactone to current therapy    Echo 08/07/20: IMPRESSIONS     1. Poor acoustic windows.   2. Septal wall motion consistent with conduction delay. . Left  ventricular ejection fraction, by estimation, is 55 to 60%. The left  ventricle has normal function. There is mild left ventricular hypertrophy.  Left ventricular diastolic parameters were  normal.   3. Right ventricular systolic function is normal. The right ventricular  size is normal.   4. The mitral valve is normal in structure. Mild mitral valve  regurgitation.   5. The aortic valve is normal in structure. Aortic valve regurgitation is  not visualized.   Comparison(s): The left ventricular function has  improved.   ASSESSMENT:    1. Nonischemic cardiomyopathy ([place])   2. Chronic systolic heart failure ([place])   3. PVC's (premature ventricular contractions)      PLAN:  In order of problems listed above:  Chronic systolic CHF with Nonischemic cardiomyopathy: EF 35 to 40% on previous cardiac catheterization/Echo. Medications have been optimized with Coreg, aldactone, losartan, and Jardiance. Echo showed normalization of EF in April 2022. She is clinically doing very well. Labs in November were good. Continue current therapy. Follow up in one year. I would like to update Echo now.   Hypertension: Blood pressure is controlled.   Hypothyroidism: Managed by primary care provider.  PVCs: this is well controlled on carvedilol.  5.   Obesity. Encourage weight loss.      Medication Adjustments/Labs and Tests Ordered: Current medicines are reviewed at length with the patient today.  Concerns regarding medicines are outlined above.  Medication changes, Labs and Tests ordered today are listed in the Patient Instructions below. There are no Patient Instructions on file for this visit.   Signed, Ihor Meinzer Martinique, MD  07/31/2022 9:52 AM    [facility] [address], [place], [place]  [address] Phone: [phone]; Fax: [phone]

## 2022-07-31 ENCOUNTER — Encounter: Payer: Self-pay | Admitting: Cardiology

## 2022-07-31 ENCOUNTER — Ambulatory Visit: Payer: Medicare Other | Attending: Cardiology | Admitting: Cardiology

## 2022-07-31 VITALS — BP 102/66 | HR 71 | Ht 63.0 in | Wt 202.6 lb

## 2022-07-31 DIAGNOSIS — I5022 Chronic systolic (congestive) heart failure: Secondary | ICD-10-CM

## 2022-07-31 DIAGNOSIS — I493 Ventricular premature depolarization: Secondary | ICD-10-CM

## 2022-07-31 DIAGNOSIS — I428 Other cardiomyopathies: Secondary | ICD-10-CM

## 2022-07-31 NOTE — Patient Instructions (Signed)
Medication Instructions:  Continue same medications *If you need a refill on your cardiac medications before your next appointment, please call your pharmacy*   Lab Work: None ordered   Testing/Procedures: Echo   Follow-Up: At [facility], you and your health needs are our priority.  As part of our continuing mission to provide you with exceptional heart care, we have created designated Provider Care Teams.  These Care Teams include your primary Cardiologist (physician) and Advanced Practice Providers (APPs -  Physician Assistants and Nurse Practitioners) who all work together to provide you with the care you need, when you need it.  We recommend signing up for the patient portal called "MyChart".  Sign up information is provided on this After Visit Summary.  MyChart is used to connect with patients for Virtual Visits (Telemedicine).  Patients are able to view lab/test results, encounter notes, upcoming appointments, etc.  Non-urgent messages can be sent to your provider as well.   To learn more about what you can do with MyChart, go to [email or web address].    Your next appointment:  1 year    Call in Dec to schedule March appointment     Provider:  Dr.Jordan

## 2022-08-20 ENCOUNTER — Other Ambulatory Visit: Payer: Self-pay | Admitting: Cardiology

## 2022-08-20 ENCOUNTER — Other Ambulatory Visit: Payer: Self-pay | Admitting: Internal Medicine

## 2022-08-27 ENCOUNTER — Ambulatory Visit (HOSPITAL_COMMUNITY): Payer: Medicare Other | Attending: Cardiology

## 2022-08-27 DIAGNOSIS — I5022 Chronic systolic (congestive) heart failure: Secondary | ICD-10-CM | POA: Diagnosis present

## 2022-08-27 DIAGNOSIS — I493 Ventricular premature depolarization: Secondary | ICD-10-CM | POA: Diagnosis present

## 2022-08-27 DIAGNOSIS — I428 Other cardiomyopathies: Secondary | ICD-10-CM

## 2022-08-27 LAB — ECHOCARDIOGRAM COMPLETE
Area-P 1/2: 4.4 cm2
S' Lateral: 3.6 cm

## 2022-09-28 ENCOUNTER — Ambulatory Visit
Admission: EM | Admit: 2022-09-28 | Discharge: 2022-09-28 | Disposition: A | Discharge status: Home / Self Care | Payer: Medicare Other | Attending: Urgent Care | Admitting: Urgent Care

## 2022-09-28 ENCOUNTER — Ambulatory Visit (INDEPENDENT_AMBULATORY_CARE_PROVIDER_SITE_OTHER): Payer: Medicare Other

## 2022-09-28 DIAGNOSIS — R0789 Other chest pain: Secondary | ICD-10-CM

## 2022-09-28 MED ORDER — LIDOCAINE 5 % EX PTCH
1.0000 | MEDICATED_PATCH | CUTANEOUS | 0 refills | Status: AC
Start: 2022-09-28 — End: ?

## 2022-09-28 NOTE — Discharge Instructions (Addendum)
You are being discharged to home without the results of your x-ray being available.  You will be contacted by telephone with the results.  I have prescribed lidocaine patches to assist with your discomfort.  I also recommend that you continue to use acetaminophen (Tylenol) for your pain.  It is important that you try to breathe deep to avoid developing a respiratory infection.  The results of your x-ray will determine the expected duration of your symptoms.  If your symptoms persist longer than the expected duration, please seek evaluation at an orthopedic clinic.  I have provided information for [facility], a local orthopedic urgent care.

## 2022-09-28 NOTE — ED Provider Notes (Signed)
Renaldo Fiddler    CSN: [id number] Arrival date & time: 09/28/22  1339      History   Chief Complaint Chief Complaint  Patient presents with   Rib Injury    HPI Kristine Curtis is a 62 y.o. female.   HPI  Patient presents to urgent care with rib pain x 1 week.  She endorses "movement" when caring for her grandchildren.  Endorses warmth to the area of pain, feels "burning" sensation and endorses pain with palpation.  She states she cannot take a deep breath because of pain.  She denies injury including fall or any other known precipitating event.  She has been treating her symptoms with Tylenol which has been ineffective.  She states she has oxycodone she could use but has avoided taking it but would consider doing so today because pain is worsened at 10 out of 10.  She states the oxycodone was prescribed for another orthopedic issue in the past.  Past Medical History:  Diagnosis Date   Allergic rhinitis due to pollen    Anemia    Anxiety    Asthma    Carpal tunnel syndrome of right wrist 12/22/2017   Cataract    Chronic combined systolic and diastolic CHF (congestive heart failure) (HCC)    06/2016 Echo: EF 40-45%, Gr1 DD   Chronic fatigue    Depression    Family history of polycystic kidney with negative CT 11/16/2012   Fibromyalgia    GERD (gastroesophageal reflux disease)    Helicobacter pylori gastritis 05/2020   Hypertension    Hypothyroidism    IBS (irritable bowel syndrome)    Internal hemorrhoids    Lymphocytic colitis    Morbid obesity (HCC)    NICM (nonischemic cardiomyopathy) (HCC)    a. 1999 Cath: nl cors;  b. EF prev as low as 35%;  c. 11/2015 Echo: Ef 40-45%;  d. 06/2016 Echo: EF 40-45%;  e. Lexiscan MV: fixed anterosepta/inferseptal defect w/o ischemia, EF 35%.   Osteoarthritis    PONV (postoperative nausea and vomiting)    PVC (premature ventricular contraction)    Restless leg syndrome    Sjogren's syndrome (HCC)    Vertigo      Patient Active Problem List   Diagnosis Date Noted   Helicobacter pylori gastritis 05/15/2021   Hormone replacement therapy (HRT) 07/29/2019   IBS (irritable bowel syndrome) 11/10/2017   B12 deficiency 11/10/2017   OSA (obstructive sleep apnea) 11/10/2017   Myalgia due to statin 11/10/2017   Lumbar disc disease 10/01/2017   RLS (restless legs syndrome) 10/01/2017   Myofascial pain dysfunction syndrome 07/14/2017   Cervical radiculopathy 07/14/2017   Degenerative arthritis of left knee, XRAY12/2018 07/13/2017   Morbid obesity (HCC) 02/24/2017   Sjogren's disease (HCC) 02/24/2017   Fibromyalgia 01/21/2017   Chronic low back pain with sciatica 05/02/2015   Nonischemic cardiomyopathy (HCC) 03/22/2015   Vitamin D deficiency 10/09/2009   Depression with anxiety 08/31/2008   Essential hypertension 08/31/2008   Allergic rhinitis 08/31/2008   Moderate persistent asthma 08/31/2008   Esophageal reflux 08/31/2008   Acquired hypothyroidism 08/30/2008   Classical migraine without intractable migraine 08/30/2008    Past Surgical History:  Procedure Laterality Date   ABDOMINAL HERNIA REPAIR     ABDOMINAL HYSTERECTOMY     CESAREAN SECTION     CHOLECYSTECTOMY  10/2008   COLONOSCOPY  02/14/2015   ESOPHAGOGASTRODUODENOSCOPY     EYE SURGERY     2 2013 and 1981/DCR of right eye 05/2014  LUMBAR DISC SURGERY  2016   L3-4   RIGHT/LEFT HEART CATH AND CORONARY ANGIOGRAPHY N/A 07/11/2016   Procedure: Right/Left Heart Cath and Coronary Angiography;  Surgeon: Peter M Sw[place], MD;  Location: [facility] INVASIVE CV LAB;  Service: Cardiovascular;  Laterality: N/A;   SHOULDER ARTHROSCOPY W/ SUBACROMIAL DECOMPRESSION AND DISTAL CLAVICLE EXCISION Right    SPINE SURGERY     C6-C7, 03/2017   TUBAL LIGATION  1992    OB History   No obstetric history on file.      Home Medications    Prior to Admission medications   Medication Sig Start Date End Date Taking? Authorizing Provider  albuterol (VENTOLIN  HFA) 108 (90 Base) MCG/ACT inhaler Inhale 2 puffs into the lungs every 6 (six) hours as needed for wheezing or shortness of breath. 07/06/21   Willow Ora, MD  Alpha-Lipoic Acid 600 MG CAPS 1 capsule Orally Once a day    [provider]  aspirin-acetaminophen-caffeine (HEADACHE RELIEF) 250-250-65 MG tablet Take by mouth every 6 (six) hours as needed for headache.    [provider]  Atogepant (QULIPTA PO) Take by mouth.    [provider]  Bacillus Coagulans-Inulin (PROBIOTIC-PREBIOTIC) 1-250 BILLION-MG CAPS Take by mouth.    [provider]  buPROPion (WELLBUTRIN XL) 300 MG 24 hr tablet TAKE 1 TABLET BY MOUTH DAILY 03/25/22   Willow Ora, MD  carvedilol (COREG) 12.5 MG tablet TAKE ONE TABLET TWICE A DAY WITH MEALS 03/25/22   [place], Peter M, MD  cetirizine (ZYRTEC) 10 MG tablet Take 10 mg by mouth daily.    [provider]  Cholecalciferol (VITAMIN D-3 PO) Take 2,000 Units by mouth daily.    [provider]  Dextrose-Fructose-Sod Citrate Loletha Carrow) [phone] MG CHEW Chew by mouth.    [provider]  diazepam (VALIUM) 5 MG tablet TAKE ONE TABLET BY MOUTH EVERY DAY AS NEEDED FOR ANXIETY 07/26/21   Willow Ora, MD  ESTRADIOL PO Take by mouth.    [provider]  fluticasone (FLONASE) 50 MCG/ACT nasal spray Place 1 spray into both nostrils daily.    [provider]  fluticasone-salmeterol (ADVAIR) 100-50 MCG/ACT AEPB Inhale 1 puff into the lungs 2 (two) times daily. 07/06/21   Willow Ora, MD  furosemide (LASIX) 20 MG tablet Take 1 tablet (20 mg total) by mouth daily as needed for edema. 07/03/20   [place], Peter M, MD  glucose blood [facility] ULTRA) test strip Use to check blood sugar TID 12/27/20   Helane Rima, DO  hydroxychloroquine (PLAQUENIL) 200 MG tablet Take 2 tablets (400 mg total) by mouth every morning. 08/12/18   Helane Rima, DO  JARDIANCE 10 MG TABS tablet TAKE ONE TABLET EACH MORNING BEFORE  BREAKFAST 08/20/22   [place], Peter M, MD  Lancets [facility] ULTRASOFT) lancets Use to check blood sugar TID 12/27/20   Helane Rima, DO  levothyroxine (SYNTHROID) 100 MCG tablet TAKE ONE TABLET EVERY DAY 03/25/22   Willow Ora, MD  losartan (COZAAR) 50 MG tablet TAKE 1 TABLET BY MOUTH ONCE DAILY 08/20/22   [place], Peter M, MD  Meclizine HCl 25 MG CHEW Chew by mouth daily as needed.    [provider]  metoCLOPramide (REGLAN) 5 MG tablet Take 1 tablet (5 mg total) by mouth every 6 (six) hours as needed for nausea. 05/09/21   Iva Boop, MD  nystatin-triamcinolone ointment [facility]) Apply 1 application topically 2 (two) times daily as needed. 02/01/21   Willow Ora,  MD  omeprazole (PRILOSEC) 40 MG capsule TAKE 1 CAPSULE BY MOUTH EVERY DAY BEFOREBREAKFAST 08/20/22   Iva Boop, MD  ondansetron (ZOFRAN ODT) 4 MG disintegrating tablet Take 1 tablet (4 mg total) by mouth every 8 (eight) hours as needed for nausea or vomiting. 01/11/21   Helane Rima, DO  OVER THE COUNTER MEDICATION Take 5 capsules by mouth daily. [employer] Optimals Vitamin & Minerals    [provider]  OVER THE COUNTER MEDICATION Take 1 scoop by mouth daily. [employer]ix Magnecal D    [provider]  oxyCODONE-acetaminophen (PERCOCET) 10-325 MG tablet Take 1 tablet by mouth daily as needed for pain. 04/11/21   Willow Ora, MD  Peak Flow Meter DEVI Use as needed to check your breathing. Log your values 07/06/21   Willow Ora, MD  rizatriptan (MAXALT) 10 MG tablet Take 10 mg by mouth as needed for migraine. May repeat in 2 hours if needed    [provider]  spironolactone (ALDACTONE) 25 MG tablet TAKE ONE TABLET EVERY DAY 03/25/22   Willow Ora, MD  topiramate (TOPAMAX) 25 MG tablet Take 1 tablet (25 mg total) by mouth 2 (two) times daily. 08/12/18   Helane Rima, DO  TYRVAYA 0.03 MG/ACT SOLN Place into both nostrils. 10/29/21   [provider]  UNABLE TO FIND Med Name: Nutrifil:  Optimal-V, Vinali, Optimal-M, Magnical-D    [provider]  Wheat Dextrin (BENEFIBER) POWD Take 2 scoop by mouth daily as needed (for constipation).     [provider]  zinc gluconate 50 MG tablet Take 50 mg by mouth daily.    [provider]    Family History Family History  Problem Relation Age of Onset   Pancreatic cancer Mother    Thyroid cancer Mother    Depression Mother    Obesity Mother    Diabetes Father    Heart disease Father    Glaucoma Father    High blood pressure Father    Thyroid cancer Father    Alcoholism Father    Obesity Father    Kidney failure Brother    Colon cancer Neg Hx    Stomach cancer Neg Hx    Esophageal cancer Neg Hx    Colon polyps Neg Hx    Rectal cancer Neg Hx     Social History Social History   Tobacco Use   Smoking status: Former    Packs/day: 2.00    Years: 12.00    Additional pack years: 0.00    Total pack years: 24.00    Types: Cigarettes    Quit date: 05/06/1989    Years since quitting: 33.4   Smokeless tobacco: Never   Tobacco comments:    quit smoking 1990  Vaping Use   Vaping Use: Never used  Substance Use Topics   Alcohol use: No    Alcohol/week: 0.0 standard drinks of alcohol   Drug use: No     Allergies   Gabapentin (once-daily), Saxenda [liraglutide -weight management], Sulfa antibiotics, Trintellix [vortioxetine], Adhesive [tape], and Codeine   Review of Systems Review of Systems   Physical Exam Triage Vital Signs ED Triage Vitals  Enc Vitals Group     BP 09/28/22 1532 128/84     Pulse Rate 09/28/22 1532 84     Resp 09/28/22 1532 18     Temp 09/28/22 1532 99 F (37.2 C)     Temp Source 09/28/22 1532 Oral     SpO2 09/28/22 1532 98 %  Weight --      Height --      Head Circumference --      Peak Flow --      Pain Score 09/28/22 1529 7     Pain Loc --      Pain Edu? --      Excl. in GC? --    No data found.  Updated Vital Signs BP 128/84 (BP Location: Left Arm)    Pulse 84   Temp 99 F (37.2 C) (Oral)   Resp 18   SpO2 98%   Visual Acuity Right Eye Distance:   Left Eye Distance:   Bilateral Distance:    Right Eye Near:   Left Eye Near:    Bilateral Near:     Physical Exam Vitals reviewed.  Constitutional:      Appearance: Normal appearance.  Cardiovascular:     Rate and Rhythm: Normal rate and regular rhythm.     Pulses: Normal pulses.     Heart sounds: Normal heart sounds.  Pulmonary:     Effort: Pulmonary effort is normal.     Breath sounds: Normal breath sounds.  Musculoskeletal:       Arms:  Skin:    General: Skin is warm and dry.  Neurological:     General: No focal deficit present.     Mental Status: She is alert and oriented to person, place, and time.  Psychiatric:        Mood and Affect: Mood normal.        Behavior: Behavior normal.      UC Treatments / Results  Labs (all labs ordered are listed, but only abnormal results are displayed) Labs Reviewed - No data to display  EKG   Radiology No results found.  Procedures Procedures (including critical care time)  Medications Ordered in UC Medications - No data to display  Initial Impression / Assessment and Plan / UC Course  I have reviewed the triage vital signs and the nursing notes.  Pertinent labs & imaging results that were available during my care of the patient were reviewed by me and considered in my medical decision making (see chart for details).   Kristine Curtis is a 62 y.o. female presenting with L sided Rib pain. Patient is afebrile without recent antipyretics, satting well on room air. Overall is well appearing though in obvious discomfort.  She appears well hydrated, without respiratory distress. Pulmonary exam is unremarkable.  Lungs CTAB without wheezing, rhonchi, rales. RRR.  Exquisite left sided chest wall pain, just inferior to her bra line, with palpation.  Reviewed relevant chart history.   X-ray ordered to rule out acute  skeletal pathology.  Patient will be discharged and contacted with result by telephone.  Informed patient that it is likely that a result positive for fracture would not result in a change in plan of care but would help determine the duration of her symptoms.  Prescribing lidocaine patch.  Recommending she continue to take acetaminophen.  Will not prescribe oxycodone or other controlled substance, however the patient states she has from previous prescription and may use.  Encourage patient to seek evaluation in orthopedic clinic if her symptoms persist longer than expected based on the diagnosis.  Counseled patient on potential for adverse effects with medications prescribed/recommended today, ER and return-to-clinic precautions discussed, patient verbalized understanding and agreement with care plan.  Final Clinical Impressions(s) / UC Diagnoses   Final diagnoses:  None   Discharge Instructions   None  ED Prescriptions   None    PDMP not reviewed this encounter.   Charma Igo, [place] 09/28/22 1558

## 2022-09-28 NOTE — ED Triage Notes (Signed)
Patient presents to UC for rib pain x 1 week.  States a lot of movement when caring for her grandchildren. States today she noted heat to to left rib area, states feels like a burning sensation to site, and pain with palpation. Took tylenol with no relief.   Denies SOB or injury.

## 2022-09-29 ENCOUNTER — Telehealth: Payer: Self-pay | Admitting: Urgent Care

## 2022-09-29 DIAGNOSIS — J189 Pneumonia, unspecified organism: Secondary | ICD-10-CM

## 2022-09-29 MED ORDER — AZITHROMYCIN 250 MG PO TABS
250.0000 mg | ORAL_TABLET | Freq: Every day | ORAL | 0 refills | Status: DC
Start: 2022-09-29 — End: 2023-09-23

## 2022-09-29 NOTE — Telephone Encounter (Signed)
Attempted to call patient at home with results of late chest x-ray last evening however patient's phone was not accepting of private calls.  Waited until clinical day this morning to follow-up on her visit yesterday where she reported left-sided chest wall pain, reproducible with palpation and deep breathing.  X-ray shows "Mild diffuse interstitial opacity, consistent with edema or infection."  Will treat with antibiotic therapy along with continued recommended supportive care and use of topical and PO analgesics (acetaminophen) for pain relief.  Counseled her to schedule f/up visit with her PCP to verify resolution.  Counseled patient on potential for adverse effects with medications prescribed/recommended today, ER and return-to-clinic precautions discussed, patient verbalized understanding and agreement with care plan.

## 2022-11-15 ENCOUNTER — Other Ambulatory Visit: Payer: Self-pay | Admitting: Internal Medicine

## 2022-11-21 ENCOUNTER — Telehealth: Payer: Self-pay | Admitting: Cardiology

## 2022-11-21 NOTE — Telephone Encounter (Signed)
Call to patient regarding BP.  She states just noticed it yesterday so started taking BP yesterday.  She does not take on a regular basis as it stays normal upper 100 teens over 70.  She was lightheaded and dizzy and felt a flutter in her chest. She states terrible migraine cycle with high BP and discussed in detail doctor visit, meds etc.  Brought back to issue of BP to focus on issue. 7/17  94/79  12:44  94/76 6:27  103/68 128/83 highest for the day. This is the point she felt "the weirdest like I was going to pass out" 112/79 Took while on the phone.   7/18 97/61 8:30 before meds 9:22  106/69   Coreg 12.5 mg BID Lasix 20 mg Daily PRN  Has not taken in a long time Losartan 50 mg Daily Spironolactone 25 mg Daily  Advised to keep a log of BP before and 2 hours after meds. Morning and evening to see if too low.  Will ask provider for recommendations or parameters.

## 2022-11-21 NOTE — Telephone Encounter (Signed)
Spoke to the patient, explained Dr. Elvis Coil advise:   I would not make any changes. BP is soft for sure but symptoms do not correlate well with this- worse symptoms with highest BP. Continue to monitor for now. If BP less than 90 systolic let us know   Patient voiced understanding.

## 2022-11-21 NOTE — Telephone Encounter (Signed)
Pt c/o BP issue: STAT if pt c/o blurred vision, one-sided weakness or slurred speech  1. What are your last 5 BP readings?  7/18: 97/61 7/17: 94/79  2. Are you having any other symptoms (ex. Dizziness, headache, blurred vision, passed out)?  Patient states she feels strange, but unable to describe the feeling  3. What is your BP issue?  BP has been low. Systolic around/below 100. Diastolic has been as low as 61.    ?

## 2022-12-10 ENCOUNTER — Other Ambulatory Visit: Payer: Self-pay | Admitting: Family Medicine

## 2022-12-10 DIAGNOSIS — R918 Other nonspecific abnormal finding of lung field: Secondary | ICD-10-CM

## 2022-12-11 ENCOUNTER — Telehealth: Payer: Self-pay | Admitting: Cardiology

## 2022-12-11 NOTE — Telephone Encounter (Signed)
  Pt c/o BP issue: STAT if pt c/o blurred vision, one-sided weakness or slurred speech  1. What are your last 5 BP readings?  This morning - 95/76 08/06 11 pm 88/69 6:02 pm 97/74 9:07 am 97/68 08/05  10:50 pm 103/73 108/72 highest   109/78 highest in 2 weeks   2. Are you having any other symptoms (ex. Dizziness, headache, blurred vision, passed out)? Fatigue, Dizziness, headache  3. What is your BP issue?   The patient reported that her blood pressure is still running low. Initially, her doctor suspected she might have COVID-19 because her temperature was between 94-96 degrees. She took a COVID-19 test and it was negative, and she plans to retake the test in the next 48 hours. Additionally, she visited her health and wellness doctor and has been taking Mounjaro. In her second month of taking it, she lost 10 pounds, which might be the reason for her low blood pressure.

## 2022-12-11 NOTE — Telephone Encounter (Signed)
Called pt back to gather more information. Pt will call back after she gets off the phone with another doctors office.

## 2022-12-11 NOTE — Telephone Encounter (Signed)
Pt returned call to this nurse. Pt's BP is dropping and she is unsure as to why this is happening. It has been dropping since 11/20/22. She is concerned the [facility]. She has been monitoring her BP. She has had some migraines before her BP dropped. She has now started to have a headache every day since 11/20/22. She is having dizziness and fatigue as well. Over the last 2 weeks she has had some chest pressure and fluttering that comes and goes. Having blurred vision, weakness, extremely exhausted, heavy arms and legs as well. Right side of face went numb last week while she was driving and she got confused and her feet became numb. She felt like she could process but not connect. Gait was off as well. She states it lasted for hours.    This morning - 95/76  08/06 11 pm 88/69  6:02 pm 97/74  9:07 am 97/68  08/05  10:50 pm 103/73  108/72 highest    109/78 highest in 2 weeks

## 2022-12-12 MED ORDER — CARVEDILOL 6.25 MG PO TABS: 6.2500 mg | ORAL_TABLET | Freq: Two times a day (BID) | ORAL | Status: DC

## 2022-12-12 NOTE — Telephone Encounter (Signed)
Spoke with pt, she is aware of the medication change. She will let us know when she needs refill.

## 2022-12-12 NOTE — Addendum Note (Signed)
Addended by: Freddi Starr on: 12/12/2022 04:38 PM   Modules accepted: Orders

## 2022-12-16 ENCOUNTER — Ambulatory Visit
Admission: RE | Admit: 2022-12-16 | Discharge: 2022-12-16 | Disposition: A | Discharge status: Home / Self Care | Payer: Medicare Other | Source: Ambulatory Visit | Attending: Family Medicine | Admitting: Family Medicine

## 2022-12-16 DIAGNOSIS — R918 Other nonspecific abnormal finding of lung field: Secondary | ICD-10-CM

## 2022-12-24 ENCOUNTER — Other Ambulatory Visit: Payer: Self-pay | Admitting: Cardiology

## 2022-12-24 ENCOUNTER — Other Ambulatory Visit: Payer: Self-pay

## 2022-12-24 DIAGNOSIS — R5383 Other fatigue: Secondary | ICD-10-CM

## 2022-12-24 DIAGNOSIS — I5022 Chronic systolic (congestive) heart failure: Secondary | ICD-10-CM

## 2022-12-24 DIAGNOSIS — R42 Dizziness and giddiness: Secondary | ICD-10-CM

## 2022-12-27 DIAGNOSIS — R42 Dizziness and giddiness: Secondary | ICD-10-CM | POA: Diagnosis not present

## 2022-12-27 DIAGNOSIS — I5022 Chronic systolic (congestive) heart failure: Secondary | ICD-10-CM

## 2022-12-27 DIAGNOSIS — R5383 Other fatigue: Secondary | ICD-10-CM | POA: Diagnosis not present

## 2023-01-01 ENCOUNTER — Other Ambulatory Visit: Payer: Self-pay

## 2023-01-01 ENCOUNTER — Encounter: Payer: Self-pay | Admitting: Physical Therapy

## 2023-01-01 ENCOUNTER — Ambulatory Visit: Payer: Medicare Other | Attending: Student | Admitting: Physical Therapy

## 2023-01-01 DIAGNOSIS — M542 Cervicalgia: Secondary | ICD-10-CM | POA: Diagnosis present

## 2023-01-01 DIAGNOSIS — M6281 Muscle weakness (generalized): Secondary | ICD-10-CM | POA: Diagnosis present

## 2023-01-01 DIAGNOSIS — R29898 Other symptoms and signs involving the musculoskeletal system: Secondary | ICD-10-CM | POA: Insufficient documentation

## 2023-01-01 DIAGNOSIS — R293 Abnormal posture: Secondary | ICD-10-CM | POA: Diagnosis present

## 2023-01-01 NOTE — Therapy (Signed)
OUTPATIENT PHYSICAL THERAPY CERVICAL EVALUATION   Patient Name: Kristine Curtis MRN: [id number] DOB:[birth year], 62 y.o., female Today's Date: 01/01/2023  END OF SESSION:  PT End of Session - 01/01/23 1520     Visit Number 1    Number of Visits 17    Date for PT Re-Evaluation 02/26/23    Authorization Type UHC MCR    Authorization Time Period 01/01/23 to 03/12/23    Progress Note Due on Visit 10    PT Start Time 1404    PT Stop Time 1444    PT Time Calculation (min) 40 min    Activity Tolerance Patient tolerated treatment well    Behavior During Therapy [facility] for tasks assessed/performed             Past Medical History:  Diagnosis Date   Allergic rhinitis due to pollen    Anemia    Anxiety    Asthma    Carpal tunnel syndrome of right wrist 12/22/2017   Cataract    Chronic combined systolic and diastolic CHF (congestive heart failure) (HCC)    06/2016 Echo: EF 40-45%, Gr1 DD   Chronic fatigue    Depression    Family history of polycystic kidney with negative CT 11/16/2012   Fibromyalgia    GERD (gastroesophageal reflux disease)    Helicobacter pylori gastritis 05/2020   Hypertension    Hypothyroidism    IBS (irritable bowel syndrome)    Internal hemorrhoids    Lymphocytic colitis    Morbid obesity (HCC)    NICM (nonischemic cardiomyopathy) (HCC)    a. 1999 Cath: nl cors;  b. EF prev as low as 35%;  c. 11/2015 Echo: Ef 40-45%;  d. 06/2016 Echo: EF 40-45%;  e. Lexiscan MV: fixed anterosepta/inferseptal defect w/o ischemia, EF 35%.   Osteoarthritis    PONV (postoperative nausea and vomiting)    PVC (premature ventricular contraction)    Restless leg syndrome    Sjogren's syndrome (HCC)    Vertigo    Past Surgical History:  Procedure Laterality Date   ABDOMINAL HERNIA REPAIR     ABDOMINAL HYSTERECTOMY     CESAREAN SECTION     CHOLECYSTECTOMY  10/2008   COLONOSCOPY  02/14/2015   ESOPHAGOGASTRODUODENOSCOPY     EYE SURGERY     2 2013 and 1981/DCR of right eye  05/2014   LUMBAR DISC SURGERY  2016   L3-4   RIGHT/LEFT HEART CATH AND CORONARY ANGIOGRAPHY N/A 07/11/2016   Procedure: Right/Left Heart Cath and Coronary Angiography;  Surgeon: Peter M Swaziland, MD;  Location: [facility] INVASIVE CV LAB;  Service: Cardiovascular;  Laterality: N/A;   SHOULDER ARTHROSCOPY W/ SUBACROMIAL DECOMPRESSION AND DISTAL CLAVICLE EXCISION Right    SPINE SURGERY     C6-C7, 03/2017   TUBAL LIGATION  1992   Patient Active Problem List   Diagnosis Date Noted   Helicobacter pylori gastritis 05/15/2021   Hormone replacement therapy (HRT) 07/29/2019   IBS (irritable bowel syndrome) 11/10/2017   B12 deficiency 11/10/2017   OSA (obstructive sleep apnea) 11/10/2017   Myalgia due to statin 11/10/2017   Lumbar disc disease 10/01/2017   RLS (restless legs syndrome) 10/01/2017   Myofascial pain dysfunction syndrome 07/14/2017   Cervical radiculopathy 07/14/2017   Degenerative arthritis of left knee, XRAY12/2018 07/13/2017   Morbid obesity (HCC) 02/24/2017   Sjogren's disease (HCC) 02/24/2017   Fibromyalgia 01/21/2017   Chronic low back pain with sciatica 05/02/2015   Nonischemic cardiomyopathy (HCC) 03/22/2015   Vitamin D deficiency 10/09/2009  Depression with anxiety 08/31/2008   Essential hypertension 08/31/2008   Allergic rhinitis 08/31/2008   Moderate persistent asthma 08/31/2008   Esophageal reflux 08/31/2008   Acquired hypothyroidism 08/30/2008   Classical migraine without intractable migraine 08/30/2008    PCP: Lavada Mesi MD   REFERRING PROVIDER: Janice Coffin, PA-C  REFERRING DIAG: [phone] (ICD-10-CM) - Myofascial pain dysfunction syndrome  THERAPY DIAG:  Cervicalgia  Abnormal posture  Muscle weakness (generalized)  Other symptoms and signs involving the musculoskeletal system  Rationale for Evaluation and Treatment: Rehabilitation  ONSET DATE: chronic   SUBJECTIVE:                                                                                                                                                                                                          SUBJECTIVE STATEMENT: I am in a terrible migraine cycle that I can't get out of. I take 60mg  of my primary migraine medicine daily and have built a tolerance and at this point it just makes me feel yucky. Shots for migraines helped slightly. Saw Kaitlin the other week, I'm still having a lot of whole head pain and pain coming up from the back of my head, she thinks I might have occipital neuralgia. I had PT here before and they fixed my posture, but then I had neck surgery and ever since I've had a hard time working on my posture on my own, I know the area has changed and maybe I have more OA in there. Have been trying some exercises on my own without much luck.  I am having some light and noise sensitivity as well along with migraines, migraines tend to hit L eyeball and it feels like an ice pick. I do get auras with my migraines like flashing lights, huge floaters, kalideoscope like things in my visions. Not sure if I'm having consistent weakness coming along with migraines- on July 16th I woke up feeling like absolute crap and ever since I have not been OK, my body has been very weak and at the time my BP has been very low. I've also had terrible fatigue, I feel like I can't stand up, having lots of weird symptoms. Heart MD changed a medicine, BP improved after that but my overall symptoms have not improved at all after that. I did have an episode in August I was driving and was not able to physically move my foot from the gas to the brake, when I arrived at my destination I had to really sit and process and think about the steps I  needed to do to get out of my car, when I did manage to get out of the car I was walking sideways. This resolved by itself, never went to the hospital. I am currently wearing a heart monitor but I had not been pushing the button when I had an episode. BP 114/76 and HR  81 this morning, checking daily. I was going to water aerobics 3x/week but right now I don't feel safe doing this.   Hand dominance: Right  PERTINENT HISTORY:  Anemia, anxiety, asthma, hx carpal tunnel syndrome, CHF, chronic fatigue, fibromyalgia, HTN, hypothyroidism, IBS, morbid obesity, NICM, OA, RLS, sjogrens, vertigo, cholecystectomy, eye surgery. Lumbar disc surgery, SAD with distal clavicle excision, spine surgery   PAIN:  Are you having pain? Yes: NPRS scale: 5-6/10 Pain location: coming from back of head and wrapping up around head and centered at the top of the head, into L eye   Pain description: migraine  Aggravating factors: photophobia, noise sensitivity, posture  Relieving factors: unsure but tried to avoid uneven ground, fast turns, etc   PRECAUTIONS: Other: currently wearing heart monitor   RED FLAGS: None     WEIGHT BEARING RESTRICTIONS: No  FALLS:  Has patient fallen in last 6 months? No  LIVING ENVIRONMENT: Lives with: lives with their spouse Lives in: House/apartment Stairs: 2 STE with rail  Has following equipment at home: None  OCCUPATION: retired   PLOF: Independent, Independent with basic ADLs, Independent with gait, and Independent with transfers  PATIENT GOALS: work on posture, improve pain as much as able, find out why I'm feeling so bad, get back to water aerobics  NEXT MD VISIT: Referring in November   OBJECTIVE:     COGNITION: Overall cognitive status: Within functional limits for tasks assessed    POSTURE: rounded shoulders, forward head, increased lumbar lordosis, and increased thoracic kyphosis  PALPATION: B upper traps very sensitive with multiple trigger points and spasms, thoracic and cervical paraspinals tight and very tender, occipitals very tight and tender   CERVICAL ROM:   Active ROM A/PROM (deg) eval  Flexion 28*  Extension 9*  Right lateral flexion 15*  Left lateral flexion 10*  Right rotation 38*  Left rotation  38*   (Blank rows = not tested)   UPPER EXTREMITY MMT:  MMT Right eval Left eval  Shoulder flexion 4 3+  Shoulder extension    Shoulder abduction 3 4  Shoulder adduction    Shoulder extension    Shoulder internal rotation    Shoulder external rotation    Middle trapezius    Lower trapezius    Elbow flexion    Elbow extension    Wrist flexion    Wrist extension    Wrist ulnar deviation    Wrist radial deviation    Wrist pronation    Wrist supination    Grip strength    Hip flexion  3- 3-  Knee extension  4 4  Ankle dorsiflexion  5 5  Hip ABD seated  3+  3+   (Blank rows = not tested)    FUNCTIONAL TESTS:  5 times sit to stand: 14.4 seconds no UEs  Gait speed 0.9 m/s no device   TODAY'S TREATMENT:  DATE:   Eval  Objective measures, care planning, appropriate education  TherEx  Chin tucks 5x3 second holds  Scap retractions x5 Upper trap stretch 1x30 seconds B Scalene stretch 1x30 seconds B    PATIENT EDUCATION:  Education details: exam findings, POC, HEP, benefits of water PT in managing symptoms/pain and helping to build strength and improved functional activity tolerance  Person educated: Patient Education method: Explanation, Demonstration, and Handouts Education comprehension: verbalized understanding, returned demonstration, and needs further education  HOME EXERCISE PROGRAM: Access Code: [email or web address] URL: [email or web address] Date: 01/01/2023 Prepared by: Nedra Hai  Exercises - Seated Cervical Retraction  - 1-2 x daily - 7 x weekly - 1 sets - 10 reps - 3 seconds  hold - Seated Upper Trapezius Stretch  - 1 x daily - 7 x weekly - 3 sets - 10 reps - Seated Scapular Retraction  - 1 x daily - 7 x weekly - 3 sets - 10 reps - Seated Scalenes Stretch  - 1 x daily - 7 x weekly - 3 sets - 10  reps  ASSESSMENT:  CLINICAL IMPRESSION: Patient is a 62 y.o. F who was seen today for physical therapy evaluation and treatment for M79.18 (ICD-10-CM) - Myofascial pain dysfunction syndrome. Exam with objective findings as above. Interestingly she has been having full body symptoms since mid-July including functional muscle weakness and poor endurance, we can definitely work on improving these concerns while she is here in PT. Would really benefit from water PT to help address general weakness and deconditioning as well prior to progression to full land therapy. Will benefit from skilled PT services to address all objective and functional concerns and attempt to reduce pain as able moving forward.   OBJECTIVE IMPAIRMENTS: decreased activity tolerance, decreased mobility, difficulty walking, decreased strength, increased fascial restrictions, impaired perceived functional ability, increased muscle spasms, impaired flexibility, improper body mechanics, postural dysfunction, obesity, and pain.   ACTIVITY LIMITATIONS: carrying, lifting, bathing, toileting, dressing, reach over head, hygiene/grooming, locomotion level, and caring for others  PARTICIPATION LIMITATIONS: meal prep, cleaning, laundry, driving, shopping, community activity, and yard work  PERSONAL FACTORS: Age, Fitness, Past/current experiences, Social background, and Time since onset of injury/illness/exacerbation are also affecting patient's functional outcome.   REHAB POTENTIAL: Fair chronicity of condition/pain   CLINICAL DECISION MAKING: Stable/uncomplicated  EVALUATION COMPLEXITY: Low   GOALS: Goals reviewed with patient? Yes  SHORT TERM GOALS: Target date: 01/29/2023    Will be compliant with appropriate progressive HEP  Baseline:  Goal status: INITIAL  2.  Cervical ROM to improve by at least 10 degrees all planes of motion  Baseline:  Goal status: INITIAL  3.  Will demonstrate improved awareness of functional posture  with use of ergonomic aides at home and in the car PRN/as desired  Baseline:  Goal status: INITIAL  4.  Pain to be no more than 3/10 at worst  Baseline:  Goal status: INITIAL    LONG TERM GOALS: Target date: 02/26/2023    MMT to improve by at least 1 grade in all weak groups  Baseline:  Goal status: INITIAL  2.  Muscle spasms to have improved by at least 50% to assist in long term pain reduction and management  Baseline:  Goal status: INITIAL  3.  Will be able to perform all functional ADLs at home without increase in pain or feelings of weakness/fatigue  Baseline:  Goal status: INITIAL  4.  Will be able to independently participate in water aerobics multiple times per week without  increase in pain or feelings of severe weakness/fatigue  Baseline:  Goal status: INITIAL     PLAN:  PT FREQUENCY: 2x/week  PT DURATION: 8 weeks  PLANNED INTERVENTIONS: Therapeutic exercises, Therapeutic activity, Patient/Family education, Self Care, Joint mobilization, Aquatic Therapy, Dry Needling, Electrical stimulation, Spinal mobilization, Cryotherapy, Moist heat, Taping, Ultrasound, Ionotophoresis 4mg /ml Dexamethasone, Manual therapy, and Re-evaluation  PLAN FOR NEXT SESSION: 1x/week in water, 1x/week on land; cervical ROM and strength, general strengthening and endurance (UEs and LEs), manual and even DN as desired   Nedra Hai, PT, DPT 01/01/23 3:21 PM

## 2023-01-02 ENCOUNTER — Other Ambulatory Visit: Payer: Self-pay | Admitting: Student

## 2023-01-02 DIAGNOSIS — G43119 Migraine with aura, intractable, without status migrainosus: Secondary | ICD-10-CM

## 2023-01-08 ENCOUNTER — Ambulatory Visit
Admission: RE | Admit: 2023-01-08 | Discharge: 2023-01-08 | Disposition: A | Discharge status: Home / Self Care | Payer: Medicare Other | Source: Ambulatory Visit | Attending: Student | Admitting: Student

## 2023-01-08 DIAGNOSIS — G43119 Migraine with aura, intractable, without status migrainosus: Secondary | ICD-10-CM | POA: Insufficient documentation

## 2023-01-14 ENCOUNTER — Ambulatory Visit: Payer: Medicare Other | Admitting: Rehabilitation

## 2023-01-16 ENCOUNTER — Encounter: Payer: Self-pay | Admitting: Physical Therapy

## 2023-01-16 ENCOUNTER — Ambulatory Visit: Payer: Medicare Other | Attending: Student | Admitting: Physical Therapy

## 2023-01-16 VITALS — BP 108/85 | HR 86

## 2023-01-16 DIAGNOSIS — M6281 Muscle weakness (generalized): Secondary | ICD-10-CM | POA: Insufficient documentation

## 2023-01-16 DIAGNOSIS — R29898 Other symptoms and signs involving the musculoskeletal system: Secondary | ICD-10-CM | POA: Diagnosis present

## 2023-01-16 DIAGNOSIS — R293 Abnormal posture: Secondary | ICD-10-CM | POA: Insufficient documentation

## 2023-01-16 DIAGNOSIS — M542 Cervicalgia: Secondary | ICD-10-CM | POA: Insufficient documentation

## 2023-01-16 NOTE — Therapy (Signed)
OUTPATIENT PHYSICAL THERAPY CERVICAL TREATMENT   Patient Name: Kristine Curtis MRN: [id number] DOB:Jun 06, 1960, 62 y.o., female Today's Date: 01/16/2023  END OF SESSION:  PT End of Session - 01/16/23 0936     Visit Number 2    Number of Visits 17    Date for PT Re-Evaluation 02/26/23    Authorization Type UHC MCR    Authorization Time Period 01/01/23 to 03/12/23    PT Start Time 0936    PT Stop Time 1014    PT Time Calculation (min) 38 min    Equipment Utilized During Treatment Gait belt    Activity Tolerance Treatment limited secondary to medical complications (Comment);Patient limited by pain    Behavior During Therapy [facility] for tasks assessed/performed             Past Medical History:  Diagnosis Date   Allergic rhinitis due to pollen    Anemia    Anxiety    Asthma    Carpal tunnel syndrome of right wrist 12/22/2017   Cataract    Chronic combined systolic and diastolic CHF (congestive heart failure) (HCC)    06/2016 Echo: EF 40-45%, Gr1 DD   Chronic fatigue    Depression    Family history of polycystic kidney with negative CT 11/16/2012   Fibromyalgia    GERD (gastroesophageal reflux disease)    Helicobacter pylori gastritis 05/2020   Hypertension    Hypothyroidism    IBS (irritable bowel syndrome)    Internal hemorrhoids    Lymphocytic colitis    Morbid obesity (HCC)    NICM (nonischemic cardiomyopathy) (HCC)    a. 1999 Cath: nl cors;  b. EF prev as low as 35%;  c. 11/2015 Echo: Ef 40-45%;  d. 06/2016 Echo: EF 40-45%;  e. Lexiscan MV: fixed anterosepta/inferseptal defect w/o ischemia, EF 35%.   Osteoarthritis    PONV (postoperative nausea and vomiting)    PVC (premature ventricular contraction)    Restless leg syndrome    Sjogren's syndrome (HCC)    Vertigo    Past Surgical History:  Procedure Laterality Date   ABDOMINAL HERNIA REPAIR     ABDOMINAL HYSTERECTOMY     CESAREAN SECTION     CHOLECYSTECTOMY  10/2008   COLONOSCOPY  02/14/2015    ESOPHAGOGASTRODUODENOSCOPY     EYE SURGERY     2 2013 and 1981/DCR of right eye 05/2014   LUMBAR DISC SURGERY  2016   L3-4   RIGHT/LEFT HEART CATH AND CORONARY ANGIOGRAPHY N/A 07/11/2016   Procedure: Right/Left Heart Cath and Coronary Angiography;  Surgeon: Peter M Swazil[place], MD;  Location: [facility] INVASIVE CV LAB;  Service: Cardiovascular;  Laterality: N/A;   SHOULDER ARTHROSCOPY W/ SUBACROMIAL DECOMPRESSION AND DISTAL CLAVICLE EXCISION Right    SPINE SURGERY     C6-C7, 03/2017   TUBAL LIGATION  1992   Patient Active Problem List   Diagnosis Date Noted   Helicobacter pylori gastritis 05/15/2021   Hormone replacement therapy (HRT) 07/29/2019   IBS (irritable bowel syndrome) 11/10/2017   B12 deficiency 11/10/2017   OSA (obstructive sleep apnea) 11/10/2017   Myalgia due to statin 11/10/2017   Lumbar disc disease 10/01/2017   RLS (restless legs syndrome) 10/01/2017   Myofascial pain dysfunction syndrome 07/14/2017   Cervical radiculopathy 07/14/2017   Degenerative arthritis of left knee, XRAY12/2018 07/13/2017   Morbid obesity (HCC) 02/24/2017   Sjogren's disease (HCC) 02/24/2017   Fibromyalgia 01/21/2017   Chronic low back pain with sciatica 05/02/2015   Nonischemic cardiomyopathy (HCC) 03/22/2015  Vitamin D deficiency 10/09/2009   Depression with anxiety 08/31/2008   Essential hypertension 08/31/2008   Allergic rhinitis 08/31/2008   Moderate persistent asthma 08/31/2008   Esophageal reflux 08/31/2008   Acquired hypothyroidism 08/30/2008   Classical migraine without intractable migraine 08/30/2008    PCP: Lavada Mesi MD   REFERRING PROVIDER: Janice Coffin, PA-C  REFERRING DIAG: [phone] (ICD-10-CM) - Myofascial pain dysfunction syndrome  THERAPY DIAG:  Cervicalgia  Abnormal posture  Muscle weakness (generalized)  Other symptoms and signs involving the musculoskeletal system  Rationale for Evaluation and Treatment: Rehabilitation  ONSET DATE: chronic   SUBJECTIVE:                                                                                                                                                                                                          SUBJECTIVE STATEMENT: Patient reports that her headache has been worse. She denies aura with symptoms. Patient reports that she has she has felt extremely off. Patient had CT done on 9/4 but still waiting on results. Patient reports that she has work done to C6-7. Denies falls/near falls.  Hand dominance: Right  PERTINENT HISTORY:  Anemia, anxiety, asthma, hx carpal tunnel syndrome, CHF, chronic fatigue, fibromyalgia, HTN, hypothyroidism, IBS, morbid obesity, NICM, OA, RLS, sjogrens, vertigo, cholecystectomy, eye surgery. Lumbar disc surgery, SAD with distal clavicle excision, spine surgery   PAIN:  Are you having pain? Yes: NPRS scale: 10/10 Pain location: coming from back of head and wrapping up around head and centered at the top of the head, into L eye   Pain description: migraine  Aggravating factors: photophobia, noise sensitivity, posture  Relieving factors: unsure but tried to avoid uneven ground, fast turns, etc   PRECAUTIONS: Other: currently wearing heart monitor   RED FLAGS: None    WEIGHT BEARING RESTRICTIONS: No  FALLS:  Has patient fallen in last 6 months? No  LIVING ENVIRONMENT: Lives with: lives with their spouse Lives in: House/apartment Stairs: 2 STE with rail  Has following equipment at home: None  OCCUPATION: retired   PLOF: Independent, Independent with basic ADLs, Independent with gait, and Independent with transfers  PATIENT GOALS: work on posture, improve pain as much as able, find out why I'm feeling so bad, get back to water aerobics  NEXT MD VISIT: Referring in November   OBJECTIVE:   COGNITION: Overall cognitive status: Within functional limits for tasks assessed  POSTURE: rounded shoulders, forward head, increased lumbar lordosis, and increased  thoracic kyphosis  PALPATION: B upper traps very sensitive with multiple trigger points and spasms,  thoracic and cervical paraspinals tight and very tender, occipitals very tight and tender   CERVICAL ROM:   Active ROM A/PROM (deg) eval  Flexion 28*  Extension 9*  Right lateral flexion 15*  Left lateral flexion 10*  Right rotation 38*  Left rotation 38*   (Blank rows = not tested)   UPPER EXTREMITY MMT:  MMT Right eval Left eval  Shoulder flexion 4 3+  Shoulder extension    Shoulder abduction 3 4  Shoulder adduction    Shoulder extension    Shoulder internal rotation    Shoulder external rotation    Middle trapezius    Lower trapezius    Elbow flexion    Elbow extension    Wrist flexion    Wrist extension    Wrist ulnar deviation    Wrist radial deviation    Wrist pronation    Wrist supination    Grip strength    Hip flexion  3- 3-  Knee extension  4 4  Ankle dorsiflexion  5 5  Hip ABD seated  3+  3+   (Blank rows = not tested)    FUNCTIONAL TESTS:  5 times sit to stand: 14.4 seconds no UEs  Gait speed 0.9 m/s no device   TODAY'S TREATMENT:                                                                                                                              DATE:   Eval  Vitals:   01/16/23 0949 01/16/23 1012  BP: 100/80 108/85  Pulse: 80 86   TherEx  Chin tucks with red theraband resistance 5x3 second holds (unable to tolerate) Chin tucks without resistance 2 x 5 with 3 second holds Open book 1 x 10 bil Scap retractions x 10 Modified wall angels with chin tuck and 90 degree shoulder ER x 10 (reports limited tolerance and "fire up neck") Standing lumbar extension and overhead shoulder flexion stretch on wall with neutral head positioning x 10 (reports severe nausea and is unable to continue)   PATIENT EDUCATION:  Education details: Continue HEP Person educated: Patient Education method: Explanation, Demonstration, and Handouts Education  comprehension: verbalized understanding, returned demonstration, and needs further education  HOME EXERCISE PROGRAM: Access Code: [email or web address] URL: [email or web address] Date: 01/01/2023 Prepared by: Nedra Hai  Exercises - Seated Cervical Retraction  - 1-2 x daily - 7 x weekly - 1 sets - 10 reps - 3 seconds  hold - Seated Upper Trapezius Stretch  - 1 x daily - 7 x weekly - 3 sets - 10 reps - Seated Scapular Retraction  - 1 x daily - 7 x weekly - 3 sets - 10 reps - Seated Scalenes Stretch  - 1 x daily - 7 x weekly - 3 sets - 10 reps  ASSESSMENT:  CLINICAL IMPRESSION: Session limited by patient's migraine in today's session; even with low level exercises, patient reported increase in nausea and demonstrates little tolerance  for activity. Did not update HEP as patient unable to tolerate new exercises. Continue POC as able.   OBJECTIVE IMPAIRMENTS: decreased activity tolerance, decreased mobility, difficulty walking, decreased strength, increased fascial restrictions, impaired perceived functional ability, increased muscle spasms, impaired flexibility, improper body mechanics, postural dysfunction, obesity, and pain.   ACTIVITY LIMITATIONS: carrying, lifting, bathing, toileting, dressing, reach over head, hygiene/grooming, locomotion level, and caring for others  PARTICIPATION LIMITATIONS: meal prep, cleaning, laundry, driving, shopping, community activity, and yard work  PERSONAL FACTORS: Age, Fitness, Past/current experiences, Social background, and Time since onset of injury/illness/exacerbation are also affecting patient's functional outcome.   REHAB POTENTIAL: Fair chronicity of condition/pain   CLINICAL DECISION MAKING: Stable/uncomplicated  EVALUATION COMPLEXITY: Low   GOALS: Goals reviewed with patient? Yes  SHORT TERM GOALS: Target date: 01/29/2023    Will be compliant with appropriate progressive HEP  Baseline:  Goal status: INITIAL  2.  Cervical ROM to  improve by at least 10 degrees all planes of motion  Baseline:  Goal status: INITIAL  3.  Will demonstrate improved awareness of functional posture with use of ergonomic aides at home and in the car PRN/as desired  Baseline:  Goal status: INITIAL  4.  Pain to be no more than 3/10 at worst  Baseline:  Goal status: INITIAL    LONG TERM GOALS: Target date: 02/26/2023    MMT to improve by at least 1 grade in all weak groups  Baseline:  Goal status: INITIAL  2.  Muscle spasms to have improved by at least 50% to assist in long term pain reduction and management  Baseline:  Goal status: INITIAL  3.  Will be able to perform all functional ADLs at home without increase in pain or feelings of weakness/fatigue  Baseline:  Goal status: INITIAL  4.  Will be able to independently participate in water aerobics multiple times per week without increase in pain or feelings of severe weakness/fatigue  Baseline:  Goal status: INITIAL     PLAN:  PT FREQUENCY: 2x/week  PT DURATION: 8 weeks  PLANNED INTERVENTIONS: Therapeutic exercises, Therapeutic activity, Patient/Family education, Self Care, Joint mobilization, Aquatic Therapy, Dry Needling, Electrical stimulation, Spinal mobilization, Cryotherapy, Moist heat, Taping, Ultrasound, Ionotophoresis 4mg /ml Dexamethasone, Manual therapy, and Re-evaluation  PLAN FOR NEXT SESSION: 1x/week in water, 1x/week on land; cervical ROM and strength, general strengthening and endurance (UEs and LEs), manual and even DN as desired   How is patient feeling since last session?, attempt to update HEP as able with cervical progression  Maryruth Eve, PT, DPT 01/16/23 1:47 PM

## 2023-01-20 ENCOUNTER — Ambulatory Visit: Payer: Medicare Other | Attending: Cardiology

## 2023-01-20 DIAGNOSIS — R5383 Other fatigue: Secondary | ICD-10-CM

## 2023-01-20 DIAGNOSIS — R42 Dizziness and giddiness: Secondary | ICD-10-CM

## 2023-01-20 DIAGNOSIS — I5022 Chronic systolic (congestive) heart failure: Secondary | ICD-10-CM

## 2023-01-23 ENCOUNTER — Ambulatory Visit: Payer: Medicare Other | Admitting: Physical Therapy

## 2023-01-23 DIAGNOSIS — M542 Cervicalgia: Secondary | ICD-10-CM

## 2023-01-23 DIAGNOSIS — R29898 Other symptoms and signs involving the musculoskeletal system: Secondary | ICD-10-CM

## 2023-01-23 DIAGNOSIS — M6281 Muscle weakness (generalized): Secondary | ICD-10-CM

## 2023-01-23 DIAGNOSIS — R293 Abnormal posture: Secondary | ICD-10-CM

## 2023-01-23 NOTE — Therapy (Signed)
OUTPATIENT PHYSICAL THERAPY CERVICAL TREATMENT   Patient Name: Kristine Curtis MRN: [id number] DOB:1960/12/06, 62 y.o., female Today's Date: 01/23/2023  END OF SESSION:  PT End of Session - 01/23/23 1149     Visit Number 3    Number of Visits 17    Date for PT Re-Evaluation 02/26/23    Authorization Type [employer] MCR    Authorization Time Period 01/01/23 to 03/12/23    PT Start Time 1145    PT Stop Time 1230    PT Time Calculation (min) 45 min    Equipment Utilized During Treatment --    Activity Tolerance Patient tolerated treatment well    Behavior During Therapy [facility] for tasks assessed/performed              Past Medical History:  Diagnosis Date   Allergic rhinitis due to pollen    Anemia    Anxiety    Asthma    Carpal tunnel syndrome of right wrist 12/22/2017   Cataract    Chronic combined systolic and diastolic CHF (congestive heart failure) (HCC)    06/2016 Echo: EF 40-45%, Gr1 DD   Chronic fatigue    Depression    Family history of polycystic kidney with negative CT 11/16/2012   Fibromyalgia    GERD (gastroesophageal reflux disease)    Helicobacter pylori gastritis 05/2020   Hypertension    Hypothyroidism    IBS (irritable bowel syndrome)    Internal hemorrhoids    Lymphocytic colitis    Morbid obesity (HCC)    NICM (nonischemic cardiomyopathy) (HCC)    a. 1999 Cath: nl cors;  b. EF prev as low as 35%;  c. 11/2015 Echo: Ef 40-45%;  d. 06/2016 Echo: EF 40-45%;  e. Lexiscan MV: fixed anterosepta/inferseptal defect w/o ischemia, EF 35%.   Osteoarthritis    PONV (postoperative nausea and vomiting)    PVC (premature ventricular contraction)    Restless leg syndrome    Sjogren's syndrome (HCC)    Vertigo    Past Surgical History:  Procedure Laterality Date   ABDOMINAL HERNIA REPAIR     ABDOMINAL HYSTERECTOMY     CESAREAN SECTION     CHOLECYSTECTOMY  10/2008   COLONOSCOPY  02/14/2015   ESOPHAGOGASTRODUODENOSCOPY     EYE SURGERY     2 2013 and 1981/DCR of  right eye 05/2014   LUMBAR DISC SURGERY  2016   L3-4   RIGHT/LEFT HEART CATH AND CORONARY ANGIOGRAPHY N/A 07/11/2016   Procedure: Right/Left Heart Cath and Coronary Angiography;  Surgeon: Peter M [place], MD;  Location: [facility] INVASIVE CV LAB;  Service: Cardiovascular;  Laterality: N/A;   SHOULDER ARTHROSCOPY W/ SUBACROMIAL DECOMPRESSION AND DISTAL CLAVICLE EXCISION Right    SPINE SURGERY     C6-C7, 03/2017   TUBAL LIGATION  1992   Patient Active Problem List   Diagnosis Date Noted   Helicobacter pylori gastritis 05/15/2021   Hormone replacement therapy (HRT) 07/29/2019   IBS (irritable bowel syndrome) 11/10/2017   B12 deficiency 11/10/2017   OSA (obstructive sleep apnea) 11/10/2017   Myalgia due to statin 11/10/2017   Lumbar disc disease 10/01/2017   RLS (restless legs syndrome) 10/01/2017   Myofascial pain dysfunction syndrome 07/14/2017   Cervical radiculopathy 07/14/2017   Degenerative arthritis of left knee, XRAY12/2018 07/13/2017   Morbid obesity (HCC) 02/24/2017   Sjogren's disease (HCC) 02/24/2017   Fibromyalgia 01/21/2017   Chronic low back pain with sciatica 05/02/2015   Nonischemic cardiomyopathy (HCC) 03/22/2015   Vitamin D deficiency 10/09/2009  Depression with anxiety 08/31/2008   Essential hypertension 08/31/2008   Allergic rhinitis 08/31/2008   Moderate persistent asthma 08/31/2008   Esophageal reflux 08/31/2008   Acquired hypothyroidism 08/30/2008   Classical migraine without intractable migraine 08/30/2008    PCP: Lavada Mesi MD   REFERRING PROVIDER: Janice Coffin, PA-C  REFERRING DIAG: [id number] (ICD-10-CM) - Myofascial pain dysfunction syndrome  THERAPY DIAG:  Cervicalgia  Abnormal posture  Muscle weakness (generalized)  Other symptoms and signs involving the musculoskeletal system  Rationale for Evaluation and Treatment: Rehabilitation  ONSET DATE: chronic   SUBJECTIVE:                                                                                                                                                                                                          SUBJECTIVE STATEMENT: Pt reports no headache today and "very very low" pain in her neck and low back, 2-3/10. Pt reports the thing bothering her the most today is her digestive issues.  Pt did have a serious headache during last session, felt very nauseous and ended up throwing up after she got home and was in bed for hours afterwards. Pt understanding that coming to therapy while having a headache may not be the best idea.  Pt reports difficulty managing her headaches as she is not currently taking any medication for them, waiting on insurance approval for meds.  Hand dominance: Right  PERTINENT HISTORY:  Anemia, anxiety, asthma, hx carpal tunnel syndrome, CHF, chronic fatigue, fibromyalgia, HTN, hypothyroidism, IBS, morbid obesity, NICM, OA, RLS, sjogrens, vertigo, cholecystectomy, eye surgery. Lumbar disc surgery, SAD with distal clavicle excision, spine surgery   PAIN:  Are you having pain? Yes: NPRS scale: 10/10 Pain location: coming from back of head and wrapping up around head and centered at the top of the head, into L eye   Pain description: migraine  Aggravating factors: photophobia, noise sensitivity, posture  Relieving factors: unsure but tried to avoid uneven ground, fast turns, etc   PRECAUTIONS: Other: currently wearing heart monitor   RED FLAGS: None    WEIGHT BEARING RESTRICTIONS: No  FALLS:  Has patient fallen in last 6 months? No  LIVING ENVIRONMENT: Lives with: lives with their spouse Lives in: House/apartment Stairs: 2 STE with rail  Has following equipment at home: None  OCCUPATION: retired   PLOF: Independent, Independent with basic ADLs, Independent with gait, and Independent with transfers  PATIENT GOALS: work on posture, improve pain as much as able, find out why I'm feeling so bad, get back to water aerobics  NEXT MD  VISIT:  Referring in November   OBJECTIVE:   COGNITION: Overall cognitive status: Within functional limits for tasks assessed  POSTURE: rounded shoulders, forward head, increased lumbar lordosis, and increased thoracic kyphosis  PALPATION: B upper traps very sensitive with multiple trigger points and spasms, thoracic and cervical paraspinals tight and very tender, occipitals very tight and tender   CERVICAL ROM:   Active ROM A/PROM (deg) eval  Flexion 28*  Extension 9*  Right lateral flexion 15*  Left lateral flexion 10*  Right rotation 38*  Left rotation 38*   (Blank rows = not tested)   UPPER EXTREMITY MMT:  MMT Right eval Left eval  Shoulder flexion 4 3+  Shoulder extension    Shoulder abduction 3 4  Shoulder adduction    Shoulder extension    Shoulder internal rotation    Shoulder external rotation    Middle trapezius    Lower trapezius    Elbow flexion    Elbow extension    Wrist flexion    Wrist extension    Wrist ulnar deviation    Wrist radial deviation    Wrist pronation    Wrist supination    Grip strength    Hip flexion  3- 3-  Knee extension  4 4  Ankle dorsiflexion  5 5  Hip ABD seated  3+  3+   (Blank rows = not tested)    FUNCTIONAL TESTS:  5 times sit to stand: 14.4 seconds no UEs  Gait speed 0.9 m/s no device   TODAY'S TREATMENT:                                                                                                                              Educated patient on referred pain from suboccipitals and splenius capitus muscle tightness leading to occipital headaches.  Pt notes that she has decreased shoulder ROM. Assessed shoulder ROM: Flexion more limited on R side more than L side but generally WFL Abduction limited to 90 degrees B Pt has pain in anterior position of R shoulder Will further assess in supine next land session  TTP and trigger points in B SCM, subocciptials, splenius, and upper traps. Pt introduced to DN and  agreeable to attempt this session.  Also trialed Chirp Wheel XR x 5 min. Provided handout for where to purchase device as well as how to perform suboccipital release with tennis balls for more cost-effective option if patient interested.   TherAct Trigger Point Dry-Needling  Treatment instructions: Expect mild to moderate muscle soreness. S/S of pneumothorax if dry needled over a lung field, and to seek immediate medical attention should they occur. Patient verbalized understanding of these instructions and education.  Patient Consent Given: Yes Education handout provided: No Muscles treated: L and R splenius Treatment response/outcome: deep ache/muscle cramp; muscle twitch detected   Trigger Point Dry Needling  What is Trigger Point Dry Needling (DN)? DN is a physical therapy technique used to treat  muscle pain and dysfunction. Specifically, DN helps deactivate muscle trigger points (muscle knots).  A thin filiform needle is used to penetrate the skin and stimulate the underlying trigger point. The goal is for a local twitch response (LTR) to occur and for the trigger point to relax. No medication of any kind is injected during the procedure.   What Does Trigger Point Dry Needling Feel Like?  The procedure feels different for each individual patient. Some patients report that they do not actually feel the needle enter the skin and overall the process is not painful. Very mild bleeding may occur. However, many patients feel a deep cramping in the muscle in which the needle was inserted. This is the local twitch response.   How Will I feel after the treatment? Soreness is normal, and the onset of soreness may not occur for a few hours. Typically this soreness does not last longer than two days.  Bruising is uncommon, however; ice can be used to decrease any possible bruising.  In rare cases feeling tired or nauseous after the treatment is normal. In addition, your symptoms may get worse  before they get better, this period will typically not last longer than 24 hours.   What Can I do After My Treatment? Increase your hydration by drinking more water for the next 24 hours. You may place ice or heat on the areas treated that have become sore, however, do not use heat on inflamed or bruised areas. Heat often brings more relief post needling. You can continue your regular activities, but vigorous activity is not recommended initially after the treatment for 24 hours. DN is best combined with other physical therapy such as strengthening, stretching, and other therapies.    PATIENT EDUCATION:  Education details: Continue HEP, TPDN, Chirp Wheel vs suboccipital release with tennis balls Person educated: Patient Education method: Explanation, Demonstration, and Handouts Education comprehension: verbalized understanding, returned demonstration, and needs further education  HOME EXERCISE PROGRAM: Access Code: [email or web address] URL: [email or web address] Date: 01/01/2023 Prepared by: Nedra Hai  Exercises - Seated Cervical Retraction  - 1-2 x daily - 7 x weekly - 1 sets - 10 reps - 3 seconds  hold - Seated Upper Trapezius Stretch  - 1 x daily - 7 x weekly - 3 sets - 10 reps - Seated Scapular Retraction  - 1 x daily - 7 x weekly - 3 sets - 10 reps - Seated Scalenes Stretch  - 1 x daily - 7 x weekly - 3 sets - 10 reps - Supine Suboccipital Release with Tennis Balls  - 1 x daily - 7 x weekly - 1 sets - 5-10 min hold  ASSESSMENT:  CLINICAL IMPRESSION: Emphasis of skilled PT session on initiating TPDN to address tight muscluature in cervical region as well as introducing Chirp Wheel for suboccipital release. Pt noted to have tenderness and trigger points throughout her cervical and upper shoulder area, limited DN to just one muscle group this date so as not to exacerbate her symptoms. Pt also feels good relief in her suboccipital muscles with use of Chirp Wheel, can benefit  from use of device and/or performing suboccipital release with tennis balls. Pt with good response to treatment this session, will assess response to DN next land visit. Pt continues to benefit from skilled therapy services to address ongoing pain and dysfunction. Continue POC.   OBJECTIVE IMPAIRMENTS: decreased activity tolerance, decreased mobility, difficulty walking, decreased strength, increased fascial restrictions, impaired perceived functional ability, increased muscle spasms, impaired flexibility, improper  body mechanics, postural dysfunction, obesity, and pain.   ACTIVITY LIMITATIONS: carrying, lifting, bathing, toileting, dressing, reach over head, hygiene/grooming, locomotion level, and caring for others  PARTICIPATION LIMITATIONS: meal prep, cleaning, laundry, driving, shopping, community activity, and yard work  PERSONAL FACTORS: Age, Fitness, Past/current experiences, Social background, and Time since onset of injury/illness/exacerbation are also affecting patient's functional outcome.   REHAB POTENTIAL: Fair chronicity of condition/pain   CLINICAL DECISION MAKING: Stable/uncomplicated  EVALUATION COMPLEXITY: Low   GOALS: Goals reviewed with patient? Yes  SHORT TERM GOALS: Target date: 01/29/2023    Will be compliant with appropriate progressive HEP  Baseline:  Goal status: INITIAL  2.  Cervical ROM to improve by at least 10 degrees all planes of motion  Baseline:  Goal status: INITIAL  3.  Will demonstrate improved awareness of functional posture with use of ergonomic aides at home and in the car PRN/as desired  Baseline:  Goal status: INITIAL  4.  Pain to be no more than 3/10 at worst  Baseline:  Goal status: INITIAL    LONG TERM GOALS: Target date: 02/26/2023    MMT to improve by at least 1 grade in all weak groups  Baseline:  Goal status: INITIAL  2.  Muscle spasms to have improved by at least 50% to assist in long term pain reduction and management   Baseline:  Goal status: INITIAL  3.  Will be able to perform all functional ADLs at home without increase in pain or feelings of weakness/fatigue  Baseline:  Goal status: INITIAL  4.  Will be able to independently participate in water aerobics multiple times per week without increase in pain or feelings of severe weakness/fatigue  Baseline:  Goal status: INITIAL     PLAN:  PT FREQUENCY: 2x/week  PT DURATION: 8 weeks  PLANNED INTERVENTIONS: Therapeutic exercises, Therapeutic activity, Patient/Family education, Self Care, Joint mobilization, Aquatic Therapy, Dry Needling, Electrical stimulation, Spinal mobilization, Cryotherapy, Moist heat, Taping, Ultrasound, Ionotophoresis 4mg /ml Dexamethasone, Manual therapy, and Re-evaluation  PLAN FOR NEXT SESSION: 1x/week in water, 1x/week on land  Land: cervical ROM and strength, general strengthening and endurance (UEs and LEs), manual and even DN as desired, look at shoulder ROM (has limited abduction in sitting, can benefit from assessment in supine and ROM/stretching as needed), how did she feel after DN and use of Chirp wheel? STG assess  Aquatic: any mobility for UB, cervical, thoracic, and lumbar regions for pain management and to increase tolerance for movement  Peter Congo, PT, DPT, CSRS  01/23/23 12:32 PM

## 2023-01-28 ENCOUNTER — Encounter: Payer: Self-pay | Admitting: Rehabilitation

## 2023-01-28 ENCOUNTER — Ambulatory Visit: Payer: Medicare Other | Admitting: Rehabilitation

## 2023-01-28 DIAGNOSIS — R293 Abnormal posture: Secondary | ICD-10-CM

## 2023-01-28 DIAGNOSIS — M542 Cervicalgia: Secondary | ICD-10-CM

## 2023-01-28 DIAGNOSIS — M6281 Muscle weakness (generalized): Secondary | ICD-10-CM

## 2023-01-28 DIAGNOSIS — R29898 Other symptoms and signs involving the musculoskeletal system: Secondary | ICD-10-CM

## 2023-01-28 NOTE — Therapy (Signed)
OUTPATIENT PHYSICAL THERAPY CERVICAL TREATMENT   Patient Name: Kristine Curtis MRN: [id number] DOB:[birth year], 62 y.o., female Today's Date: 01/28/2023  END OF SESSION:  PT End of Session - 01/28/23 0811     Visit Number 4    Number of Visits 17    Date for PT Re-Evaluation 02/26/23    Authorization Type [employer] MCR    Authorization Time Period 01/01/23 to 03/12/23    PT Start Time 0846    PT Stop Time 0930    PT Time Calculation (min) 44 min    Equipment Utilized During Treatment Other (comment)   floatation devices as needed for safety   Activity Tolerance Patient tolerated treatment well    Behavior During Therapy [facility] for tasks assessed/performed              Past Medical History:  Diagnosis Date   Allergic rhinitis due to pollen    Anemia    Anxiety    Asthma    Carpal tunnel syndrome of right wrist 12/22/2017   Cataract    Chronic combined systolic and diastolic CHF (congestive heart failure) (HCC)    06/2016 Echo: EF 40-45%, Gr1 DD   Chronic fatigue    Depression    Family history of polycystic kidney with negative CT 11/16/2012   Fibromyalgia    GERD (gastroesophageal reflux disease)    Helicobacter pylori gastritis 05/2020   Hypertension    Hypothyroidism    IBS (irritable bowel syndrome)    Internal hemorrhoids    Lymphocytic colitis    Morbid obesity (HCC)    NICM (nonischemic cardiomyopathy) (HCC)    a. 1999 Cath: nl cors;  b. EF prev as low as 35%;  c. 11/2015 Echo: Ef 40-45%;  d. 06/2016 Echo: EF 40-45%;  e. Lexiscan MV: fixed anterosepta/inferseptal defect w/o ischemia, EF 35%.   Osteoarthritis    PONV (postoperative nausea and vomiting)    PVC (premature ventricular contraction)    Restless leg syndrome    Sjogren's syndrome (HCC)    Vertigo    Past Surgical History:  Procedure Laterality Date   ABDOMINAL HERNIA REPAIR     ABDOMINAL HYSTERECTOMY     CESAREAN SECTION     CHOLECYSTECTOMY  10/2008   COLONOSCOPY  02/14/2015    ESOPHAGOGASTRODUODENOSCOPY     EYE SURGERY     2 2013 and 1981/DCR of right eye 05/2014   LUMBAR DISC SURGERY  2016   L3-4   RIGHT/LEFT HEART CATH AND CORONARY ANGIOGRAPHY N/A 07/11/2016   Procedure: Right/Left Heart Cath and Coronary Angiography;  Surgeon: Peter M Swaziland, MD;  Location: [facility] INVASIVE CV LAB;  Service: Cardiovascular;  Laterality: N/A;   SHOULDER ARTHROSCOPY W/ SUBACROMIAL DECOMPRESSION AND DISTAL CLAVICLE EXCISION Right    SPINE SURGERY     C6-C7, 03/2017   TUBAL LIGATION  1992   Patient Active Problem List   Diagnosis Date Noted   Helicobacter pylori gastritis 05/15/2021   Hormone replacement therapy (HRT) 07/29/2019   IBS (irritable bowel syndrome) 11/10/2017   B12 deficiency 11/10/2017   OSA (obstructive sleep apnea) 11/10/2017   Myalgia due to statin 11/10/2017   Lumbar disc disease 10/01/2017   RLS (restless legs syndrome) 10/01/2017   Myofascial pain dysfunction syndrome 07/14/2017   Cervical radiculopathy 07/14/2017   Degenerative arthritis of left knee, XRAY12/2018 07/13/2017   Morbid obesity (HCC) 02/24/2017   Sjogren's disease (HCC) 02/24/2017   Fibromyalgia 01/21/2017   Chronic low back pain with sciatica 05/02/2015   Nonischemic cardiomyopathy (HCC)  03/22/2015   Vitamin D deficiency 10/09/2009   Depression with anxiety 08/31/2008   Essential hypertension 08/31/2008   Allergic rhinitis 08/31/2008   Moderate persistent asthma 08/31/2008   Esophageal reflux 08/31/2008   Acquired hypothyroidism 08/30/2008   Classical migraine without intractable migraine 08/30/2008    PCP: Lavada Mesi MD   REFERRING PROVIDER: Janice Coffin, PA-C  REFERRING DIAG: [phone] (ICD-10-CM) - Myofascial pain dysfunction syndrome  THERAPY DIAG:  Cervicalgia  Abnormal posture  Muscle weakness (generalized)  Other symptoms and signs involving the musculoskeletal system  Rationale for Evaluation and Treatment: Rehabilitation  ONSET DATE: chronic   SUBJECTIVE:                                                                                                                                                                                                          SUBJECTIVE STATEMENT: Pt presents to pool today excited to get back into pool.  Stopped doing water aerobics in August due to increased pain/headaches.    Hand dominance: Right  PERTINENT HISTORY:  Anemia, anxiety, asthma, hx carpal tunnel syndrome, CHF, chronic fatigue, fibromyalgia, HTN, hypothyroidism, IBS, morbid obesity, NICM, OA, RLS, sjogrens, vertigo, cholecystectomy, eye surgery. Lumbar disc surgery, SAD with distal clavicle excision, spine surgery   PAIN:  Are you having pain? Yes: NPRS scale: 2-3/10 Pain location: headache   Pain description: dull Aggravating factors: photophobia, noise sensitivity, posture  Relieving factors: unsure but tried to avoid uneven ground, fast turns, etc   PRECAUTIONS: Other: currently wearing heart monitor   RED FLAGS: None    WEIGHT BEARING RESTRICTIONS: No  FALLS:  Has patient fallen in last 6 months? No  LIVING ENVIRONMENT: Lives with: lives with their spouse Lives in: House/apartment Stairs: 2 STE with rail  Has following equipment at home: None  OCCUPATION: retired   PLOF: Independent, Independent with basic ADLs, Independent with gait, and Independent with transfers  PATIENT GOALS: work on posture, improve pain as much as able, find out why I'm feeling so bad, get back to water aerobics  NEXT MD VISIT: Referring in November   OBJECTIVE:   COGNITION: Overall cognitive status: Within functional limits for tasks assessed  POSTURE: rounded shoulders, forward head, increased lumbar lordosis, and increased thoracic kyphosis  PALPATION: B upper traps very sensitive with multiple trigger points and spasms, thoracic and cervical paraspinals tight and very tender, occipitals very tight and tender   CERVICAL ROM:   Active ROM A/PROM  (deg) eval  Flexion 28*  Extension 9*  Right lateral flexion 15*  Left lateral flexion 10*  Right rotation 38*  Left rotation 38*   (Blank rows = not tested)   UPPER EXTREMITY MMT:  MMT Right eval Left eval  Shoulder flexion 4 3+  Shoulder extension    Shoulder abduction 3 4  Shoulder adduction    Shoulder extension    Shoulder internal rotation    Shoulder external rotation    Middle trapezius    Lower trapezius    Elbow flexion    Elbow extension    Wrist flexion    Wrist extension    Wrist ulnar deviation    Wrist radial deviation    Wrist pronation    Wrist supination    Grip strength    Hip flexion  3- 3-  Knee extension  4 4  Ankle dorsiflexion  5 5  Hip ABD seated  3+  3+   (Blank rows = not tested)    FUNCTIONAL TESTS:  5 times sit to stand: 14.4 seconds no UEs  Gait speed 0.9 m/s no device   TODAY'S TREATMENT:                                                                                                                              Patient seen for aquatic therapy today.  Treatment took place in water 3.6-4.0 feet deep depending upon activity.  Pt entered and exited the pool via stairs with use of rail.  Pool temp approx 92 deg.   Warm up:  Pt ambulated forwards x approx 18' x 4 laps, backwards x 4 laps and side stepping x 4 laps.  Performed all without UE support.  Good speed and posture.    Balance/LE strength:Marching forward holding smaller barbells for support x 2 laps>added in opposite arm motions with barbells x 2 more laps.  Cues for slower speed to emphasize SLS/balance challenge.  Standing hip flex/hip abd/hip ext initially with use of yellow noodle, however she is unable to do when standing on LLE (weaker post lumbar sx) so had her hold wall for support but she is able to do with pool noodle when standing on RLE (intermittent support from PT but overall more stable).  Educated on using wall when needed, esp if more current in water.  Also  educated on breaking tasks down to either just flexion, flex/ext, then all 3 motions as balance improves.  Pt verbalized understanding.    Core/balance:  Ai Chi postures "Enclosing" and "Accepting" x 10 reps each.  Pt with mild to moderate LOB however did improve with cuing and increased reps.  During "Accepting" PT had her hold small barbells for more support and pt eventually self progressed to a modified "Balancing" posture x 10 more reps.  Standing with feet together performing slow shoulder extension with small barbells into slow return to flexion x 10 reps.  Maintaining barbells in water performing rapid alternating shoulder flex/ext x 3 sets of 15 secs with cues for relaxed knee posture and keeping core  engaged.    Stretching at end of session with back on wall, PT assisted with placing small noodle under calf for hamstring stretch x 30 secs>moving leg out to side for groin stretch x 30 secs.  Did both LEs and also cued her for keeping palms flat against wall for ant chest stretch as well.     Discussed POC and goals with pt during session.  Pt is going to try to do water aerobics class tomorrow if feeling well.  Discussed nature of cervicogenic headaches as symptoms are getting better and migraine days are further apart/less frequent.     Pt requires buoyancy of water for support for reduced fall risk and for unloading/reduced stress on joints (spine) as pt able to tolerate increased standing and ambulation in water compared to that on land; viscosity of water is needed for resistance for strengthening and current of water provides perturbations for challenge for balance training       PATIENT EDUCATION:  Education details: aquatic rationale Person educated: Patient Education method: Programmer, multimedia, Demonstration, and Handouts Education comprehension: verbalized understanding, returned demonstration, and needs further education  HOME EXERCISE PROGRAM: Access Code: [email or web address] URL:  [email or web address] Date: 01/01/2023 Prepared by: Nedra Hai  Exercises - Seated Cervical Retraction  - 1-2 x daily - 7 x weekly - 1 sets - 10 reps - 3 seconds  hold - Seated Upper Trapezius Stretch  - 1 x daily - 7 x weekly - 3 sets - 10 reps - Seated Scapular Retraction  - 1 x daily - 7 x weekly - 3 sets - 10 reps - Seated Scalenes Stretch  - 1 x daily - 7 x weekly - 3 sets - 10 reps - Supine Suboccipital Release with Tennis Balls  - 1 x daily - 7 x weekly - 1 sets - 5-10 min hold  ASSESSMENT:  CLINICAL IMPRESSION: Pt seen for first session at [facility] for aquatic PT.  Pt presented with very low pain today and no real headache.  Pt tolerated all strengthening and core exercises well with mild to moderate LOB throughout.  She is able to self correct with cues, not needing more than min/guard throughout.  Discussed that PT would try more core exercises next time sitting on noodle and eventually give HEP for times she wants to go to pool outside of aerobics class.     OBJECTIVE IMPAIRMENTS: decreased activity tolerance, decreased mobility, difficulty walking, decreased strength, increased fascial restrictions, impaired perceived functional ability, increased muscle spasms, impaired flexibility, improper body mechanics, postural dysfunction, obesity, and pain.   ACTIVITY LIMITATIONS: carrying, lifting, bathing, toileting, dressing, reach over head, hygiene/grooming, locomotion level, and caring for others  PARTICIPATION LIMITATIONS: meal prep, cleaning, laundry, driving, shopping, community activity, and yard work  PERSONAL FACTORS: Age, Fitness, Past/current experiences, Social background, and Time since onset of injury/illness/exacerbation are also affecting patient's functional outcome.   REHAB POTENTIAL: Fair chronicity of condition/pain   CLINICAL DECISION MAKING: Stable/uncomplicated  EVALUATION COMPLEXITY: Low   GOALS: Goals reviewed with patient?  Yes  SHORT TERM GOALS: Target date: 01/29/2023    Will be compliant with appropriate progressive HEP  Baseline:  Goal status: INITIAL  2.  Cervical ROM to improve by at least 10 degrees all planes of motion  Baseline:  Goal status: INITIAL  3.  Will demonstrate improved awareness of functional posture with use of ergonomic aides at home and in the car PRN/as desired  Baseline:  Goal status: INITIAL  4.  Pain to be no more  than 3/10 at worst  Baseline:  Goal status: INITIAL    LONG TERM GOALS: Target date: 02/26/2023    MMT to improve by at least 1 grade in all weak groups  Baseline:  Goal status: INITIAL  2.  Muscle spasms to have improved by at least 50% to assist in long term pain reduction and management  Baseline:  Goal status: INITIAL  3.  Will be able to perform all functional ADLs at home without increase in pain or feelings of weakness/fatigue  Baseline:  Goal status: INITIAL  4.  Will be able to independently participate in water aerobics multiple times per week without increase in pain or feelings of severe weakness/fatigue  Baseline:  Goal status: INITIAL     PLAN:  PT FREQUENCY: 2x/week  PT DURATION: 8 weeks  PLANNED INTERVENTIONS: Therapeutic exercises, Therapeutic activity, Patient/Family education, Self Care, Joint mobilization, Aquatic Therapy, Dry Needling, Electrical stimulation, Spinal mobilization, Cryotherapy, Moist heat, Taping, Ultrasound, Ionotophoresis 4mg /ml Dexamethasone, Manual therapy, and Re-evaluation  PLAN FOR NEXT SESSION: 1x/week in water, 1x/week on land  Land: How did she tolerate pool?? Did she go to aerobics class the next day?? She LOVED DN!!cervical ROM and strength, general strengthening and endurance (UEs and LEs), manual and even DN as desired, look at shoulder ROM (has limited abduction in sitting, can benefit from assessment in supine and ROM/stretching as needed), how did she feel after DN and use of Chirp wheel? STG  assess  Aquatic: any mobility for UB, cervical, thoracic, and lumbar regions for pain management and to increase tolerance for movement  Harriet Butte, PT, MPT [facility] [address] [place], [place], [address] Phone: [phone]   Fax:  [phone] 01/28/23, 9:38 AM

## 2023-01-30 ENCOUNTER — Ambulatory Visit: Payer: Medicare Other | Admitting: Physical Therapy

## 2023-01-30 DIAGNOSIS — M6281 Muscle weakness (generalized): Secondary | ICD-10-CM

## 2023-01-30 DIAGNOSIS — M542 Cervicalgia: Secondary | ICD-10-CM

## 2023-01-30 DIAGNOSIS — R293 Abnormal posture: Secondary | ICD-10-CM

## 2023-01-30 DIAGNOSIS — R29898 Other symptoms and signs involving the musculoskeletal system: Secondary | ICD-10-CM

## 2023-01-30 NOTE — Therapy (Addendum)
OUTPATIENT PHYSICAL THERAPY CERVICAL TREATMENT   Patient Name: Kristine Curtis MRN: [id number] DOB:Sep 07, 1960, 62 y.o., female Today's Date: 01/30/2023  END OF SESSION:  PT End of Session - 01/30/23 1103     Visit Number 5    Number of Visits 17    Date for PT Re-Evaluation 02/26/23    Authorization Type [employer] MCR    Authorization Time Period 01/01/23 to 03/12/23    PT Start Time 1103    PT Stop Time 1145    PT Time Calculation (min) 42 min    Equipment Utilized During Treatment --    Activity Tolerance Patient tolerated treatment well    Behavior During Therapy WFL for tasks assessed/performed              Past Medical History:  Diagnosis Date   Allergic rhinitis due to pollen    Anemia    Anxiety    Asthma    Carpal tunnel syndrome of right wrist 12/22/2017   Cataract    Chronic combined systolic and diastolic CHF (congestive heart failure) (HCC)    06/2016 Echo: EF 40-45%, Gr1 DD   Chronic fatigue    Depression    Family history of polycystic kidney with negative CT 11/16/2012   Fibromyalgia    GERD (gastroesophageal reflux disease)    Helicobacter pylori gastritis 05/2020   Hypertension    Hypothyroidism    IBS (irritable bowel syndrome)    Internal hemorrhoids    Lymphocytic colitis    Morbid obesity (HCC)    NICM (nonischemic cardiomyopathy) (HCC)    a. 1999 Cath: nl cors;  b. EF prev as low as 35%;  c. 11/2015 Echo: Ef 40-45%;  d. 06/2016 Echo: EF 40-45%;  e. Lexiscan MV: fixed anterosepta/inferseptal defect w/o ischemia, EF 35%.   Osteoarthritis    PONV (postoperative nausea and vomiting)    PVC (premature ventricular contraction)    Restless leg syndrome    Sjogren's syndrome (HCC)    Vertigo    Past Surgical History:  Procedure Laterality Date   ABDOMINAL HERNIA REPAIR     ABDOMINAL HYSTERECTOMY     CESAREAN SECTION     CHOLECYSTECTOMY  10/2008   COLONOSCOPY  02/14/2015   ESOPHAGOGASTRODUODENOSCOPY     EYE SURGERY     2 2013 and 1981/DCR of  right eye 05/2014   LUMBAR DISC SURGERY  2016   L3-4   RIGHT/LEFT HEART CATH AND CORONARY ANGIOGRAPHY N/A 07/11/2016   Procedure: Right/Left Heart Cath and Coronary Angiography;  Surgeon: Peter M [place], MD;  Location: [facility] INVASIVE CV LAB;  Service: Cardiovascular;  Laterality: N/A;   SHOULDER ARTHROSCOPY W/ SUBACROMIAL DECOMPRESSION AND DISTAL CLAVICLE EXCISION Right    SPINE SURGERY     C6-C7, 03/2017   TUBAL LIGATION  1992   Patient Active Problem List   Diagnosis Date Noted   Helicobacter pylori gastritis 05/15/2021   Hormone replacement therapy (HRT) 07/29/2019   IBS (irritable bowel syndrome) 11/10/2017   B12 deficiency 11/10/2017   OSA (obstructive sleep apnea) 11/10/2017   Myalgia due to statin 11/10/2017   Lumbar disc disease 10/01/2017   RLS (restless legs syndrome) 10/01/2017   Myofascial pain dysfunction syndrome 07/14/2017   Cervical radiculopathy 07/14/2017   Degenerative arthritis of left knee, XRAY12/2018 07/13/2017   Morbid obesity (HCC) 02/24/2017   Sjogren's disease (HCC) 02/24/2017   Fibromyalgia 01/21/2017   Chronic low back pain with sciatica 05/02/2015   Nonischemic cardiomyopathy (HCC) 03/22/2015   Vitamin D deficiency 10/09/2009  Depression with anxiety 08/31/2008   Essential hypertension 08/31/2008   Allergic rhinitis 08/31/2008   Moderate persistent asthma 08/31/2008   Esophageal reflux 08/31/2008   Acquired hypothyroidism 08/30/2008   Classical migraine without intractable migraine 08/30/2008    PCP: Lavada Mesi MD   REFERRING PROVIDER: Janice Coffin, PA-C  REFERRING DIAG: [phone] (ICD-10-CM) - Myofascial pain dysfunction syndrome  THERAPY DIAG:  Cervicalgia  Abnormal posture  Muscle weakness (generalized)  Other symptoms and signs involving the musculoskeletal system  Rationale for Evaluation and Treatment: Rehabilitation  ONSET DATE: chronic   SUBJECTIVE:                                                                                                                                                                                                          SUBJECTIVE STATEMENT:  After dry needling felt rather tender, alternated ice and heat for relief. The next day she was surprised to have a lot more motion in neck and shoulders. Reports having a lot of cracking sensations in neck.  Has been working on shoulder abduction in supine. Patient denies falls or any other acute changes.   Reports still having a headache most every day 2-4/10 pain, worst on past Sunday 7+/10.  Pt reports enjoying aquatic therapy, feels that it was helpful.  "I'm definitely making forward progress"  Hand dominance: Right  PERTINENT HISTORY:  Anemia, anxiety, asthma, hx carpal tunnel syndrome, CHF, chronic fatigue, fibromyalgia, HTN, hypothyroidism, IBS, morbid obesity, NICM, OA, RLS, sjogrens, vertigo, cholecystectomy, eye surgery. Lumbar disc surgery, SAD with distal clavicle excision, spine surgery   PAIN:  Are you having pain? Yes: NPRS scale: 2/10 Pain location: headache   Pain description: dull Aggravating factors: photophobia, noise sensitivity, posture  Relieving factors: unsure but tried pay more attention to what could have triggered it and to avoid that activity, will lay on floor with chirp wheel   PRECAUTIONS: Other: currently wearing heart monitor   RED FLAGS: None    WEIGHT BEARING RESTRICTIONS: No  FALLS:  Has patient fallen in last 6 months? No  LIVING ENVIRONMENT: Lives with: lives with their spouse Lives in: House/apartment Stairs: 2 STE with rail  Has following equipment at home: None  OCCUPATION: retired   PLOF: Independent, Independent with basic ADLs, Independent with gait, and Independent with transfers  PATIENT GOALS: work on posture, improve pain as much as able, find out why I'm feeling so bad, get back to water aerobics  NEXT MD VISIT: Referring in November   OBJECTIVE:   COGNITION: Overall  cognitive status: Within functional  limits for tasks assessed  POSTURE: rounded shoulders, forward head, increased lumbar lordosis, and increased thoracic kyphosis  PALPATION: B upper traps very sensitive with multiple trigger points and spasms, thoracic and cervical paraspinals tight and very tender, occipitals very tight and tender   CERVICAL ROM:   Active ROM A/PROM (deg) eval  Flexion 28*  Extension 9*  Right lateral flexion 15*  Left lateral flexion 10*  Right rotation 38*  Left rotation 38*   (Blank rows = not tested)   UPPER EXTREMITY MMT:  MMT Right eval Left eval  Shoulder flexion 4 3+  Shoulder extension    Shoulder abduction 3 4  Shoulder adduction    Shoulder extension    Shoulder internal rotation    Shoulder external rotation    Middle trapezius    Lower trapezius    Elbow flexion    Elbow extension    Wrist flexion    Wrist extension    Wrist ulnar deviation    Wrist radial deviation    Wrist pronation    Wrist supination    Grip strength    Hip flexion  3- 3-  Knee extension  4 4  Ankle dorsiflexion  5 5  Hip ABD seated  3+  3+   (Blank rows = not tested)  In Supine: Bilateral AAROM shoulder flexion with dowel rod x10 Bilateral AAROM shoulder abduction with dowel rod x10   FUNCTIONAL TESTS:  5 times sit to stand: 14.4 seconds no UEs  Gait speed 0.9 m/s no device   TODAY'S TREATMENT:                                                                                                                              TherEx CERVICAL ROM:   Active ROM A/PROM (deg) eval AROM (deg) 01/30/23  Flexion 28* 38*, tender suboccipitals  Extension 9* 40*  Right lateral flexion 15* 27*, tender  Left lateral flexion 10* 20*, tender  Right rotation 38* 46*  Left rotation 38* 51*   (Blank rows = not tested)   UPPER EXTREMITY ROM:  Passive ROM Right 01/30/23 Left 01/30/23  Shoulder flexion 110 145  Shoulder extension    Shoulder abduction 110, pain in  upper trap 100  Shoulder adduction    Shoulder internal rotation WNL WNL  Shoulder external rotation Limited, pain anterior joint line Slightly limited, pain free  Elbow flexion    Elbow extension    Wrist flexion    Wrist extension    Wrist ulnar deviation    Wrist radial deviation    Wrist pronation    Wrist supination     (Blank rows = not tested)    Trigger Point Dry-Needling  Treatment instructions: Expect mild to moderate muscle soreness. S/S of pneumothorax if dry needled over a lung field, and to seek immediate medical attention should they occur. Patient verbalized understanding of these instructions and education.  Patient Consent Given: Yes Education handout  provided: Previously provided Muscles treated: Bilateral suboccipitals, Bilateral splenius  Treatment response/outcome: deep ache/ muscle cramp, muscle twitch   PATIENT EDUCATION:  Education details: progress towards goals, new HEP exercises Person educated: Patient Education method: Explanation, Demonstration, and Handouts Education comprehension: verbalized understanding, returned demonstration, and needs further education  HOME EXERCISE PROGRAM: Access Code: [email or web address] URL: [email or web address] Date: 01/01/2023 Prepared by: Nedra Hai  Exercises - Seated Cervical Retraction  - 1-2 x daily - 7 x weekly - 1 sets - 10 reps - 3 seconds  hold - Seated Upper Trapezius Stretch  - 1 x daily - 7 x weekly - 3 sets - 10 reps - Seated Scapular Retraction  - 1 x daily - 7 x weekly - 3 sets - 10 reps - Seated Scalenes Stretch  - 1 x daily - 7 x weekly - 3 sets - 10 reps - Supine Suboccipital Release with Tennis Balls  - 1 x daily - 7 x weekly - 1 sets - 5-10 min hold - Supine Shoulder Flexion with Dowel  - 1 x daily - 7 x weekly - 3 sets - 10 reps - Supine Shoulder Abduction AAROM with Dowel  - 1 x daily - 7 x weekly - 3 sets - 10 reps  ASSESSMENT:  CLINICAL IMPRESSION: Emphasis of skilled PT session  on evaluation of progress towards STGs, TPDN to address tight muscluature in cervical region as well as shoulder AAROM exercises. Patient has met 3/4 STGs and is making progress towards 1/4 STGs indicated by improving ROM >10* in all planes, beginning an initial HEP, reporting average pain improving, and verbalizing understanding of impacts of posture on pain. Pt with good response to treatment this session. Pt continues to benefit from skilled therapy services to address ongoing pain and dysfunction. Continue POC.     OBJECTIVE IMPAIRMENTS: decreased activity tolerance, decreased mobility, difficulty walking, decreased strength, increased fascial restrictions, impaired perceived functional ability, increased muscle spasms, impaired flexibility, improper body mechanics, postural dysfunction, obesity, and pain.   ACTIVITY LIMITATIONS: carrying, lifting, bathing, toileting, dressing, reach over head, hygiene/grooming, locomotion level, and caring for others  PARTICIPATION LIMITATIONS: meal prep, cleaning, laundry, driving, shopping, community activity, and yard work  PERSONAL FACTORS: Age, Fitness, Past/current experiences, Social background, and Time since onset of injury/illness/exacerbation are also affecting patient's functional outcome.   REHAB POTENTIAL: Fair chronicity of condition/pain   CLINICAL DECISION MAKING: Stable/uncomplicated  EVALUATION COMPLEXITY: Low   GOALS: Goals reviewed with patient? Yes  SHORT TERM GOALS: Target date: 01/29/2023    Will be compliant with appropriate progressive HEP  Baseline: (9/26) pt has initial HEP and reports using regularly Goal status: MET  2.  Cervical ROM to improve by at least 10 degrees all planes of motion  Baseline: (9/26)see cervical ROM from today's treatment, pt has improved ROM >10* in all directions Goal status: MET  3.  Will demonstrate improved awareness of functional posture with use of ergonomic aides at home and in the car  PRN/as desired  Baseline: (9/26) pt describes being more aware of posture when using phone, etc. Goal status: MET  4.  Pain to be no more than 3/10 at worst  Baseline: (9/26) reports past week average was 7-8/10 but overall the trend has been decreasing pain Goal status: PROGRESSING    LONG TERM GOALS: Target date: 02/26/2023    MMT to improve by at least 1 grade in all weak groups  Baseline:  Goal status: INITIAL  2.  Muscle spasms to have improved by  at least 50% to assist in long term pain reduction and management  Baseline:  Goal status: INITIAL  3.  Will be able to perform all functional ADLs at home without increase in pain or feelings of weakness/fatigue  Baseline:  Goal status: INITIAL  4.  Will be able to independently participate in water aerobics multiple times per week without increase in pain or feelings of severe weakness/fatigue  Baseline:  Goal status: INITIAL     PLAN:  PT FREQUENCY: 2x/week  PT DURATION: 8 weeks  PLANNED INTERVENTIONS: Therapeutic exercises, Therapeutic activity, Patient/Family education, Self Care, Joint mobilization, Aquatic Therapy, Dry Needling, Electrical stimulation, Spinal mobilization, Cryotherapy, Moist heat, Taping, Ultrasound, Ionotophoresis 4mg /ml Dexamethasone, Manual therapy, and Re-evaluation  PLAN FOR NEXT SESSION: 1x/week in water, 1x/week on land  Land:  cervical ROM and strength, general strengthening and endurance (UEs and LEs), manual and DN as desired, shoulder ROM stretches and exercises, pec stretches  Aquatic: any mobility for UB, cervical, thoracic, and lumbar regions for pain management and to increase tolerance for movement   Beverely Low, SPT  Peter Congo, PT, DPT, CSRS   01/30/23, 12:43 PM

## 2023-02-04 ENCOUNTER — Encounter: Payer: Self-pay | Admitting: Rehabilitation

## 2023-02-04 ENCOUNTER — Ambulatory Visit: Payer: Medicare Other | Attending: Student | Admitting: Rehabilitation

## 2023-02-04 DIAGNOSIS — M6281 Muscle weakness (generalized): Secondary | ICD-10-CM | POA: Diagnosis present

## 2023-02-04 DIAGNOSIS — R293 Abnormal posture: Secondary | ICD-10-CM | POA: Insufficient documentation

## 2023-02-04 DIAGNOSIS — M542 Cervicalgia: Secondary | ICD-10-CM | POA: Diagnosis present

## 2023-02-04 DIAGNOSIS — R29898 Other symptoms and signs involving the musculoskeletal system: Secondary | ICD-10-CM | POA: Insufficient documentation

## 2023-02-04 NOTE — Therapy (Signed)
OUTPATIENT PHYSICAL THERAPY CERVICAL TREATMENT   Patient Name: Kristine Curtis MRN: [id number] DOB:June 28, 1960, 62 y.o., female Today's Date: 02/04/2023  END OF SESSION:  PT End of Session - 02/04/23 0808     Visit Number 6    Number of Visits 17    Date for PT Re-Evaluation 02/26/23    Authorization Type [employer] MCR    Authorization Time Period 01/01/23 to 03/12/23    PT Start Time 1102    PT Stop Time 1145    PT Time Calculation (min) 43 min    Equipment Utilized During Treatment Other (comment)   floatation devices as needed for safety   Activity Tolerance Patient tolerated treatment well    Behavior During Therapy WFL for tasks assessed/performed              Past Medical History:  Diagnosis Date   Allergic rhinitis due to pollen    Anemia    Anxiety    Asthma    Carpal tunnel syndrome of right wrist 12/22/2017   Cataract    Chronic combined systolic and diastolic CHF (congestive heart failure) (HCC)    06/2016 Echo: EF 40-45%, Gr1 DD   Chronic fatigue    Depression    Family history of polycystic kidney with negative CT 11/16/2012   Fibromyalgia    GERD (gastroesophageal reflux disease)    Helicobacter pylori gastritis 05/2020   Hypertension    Hypothyroidism    IBS (irritable bowel syndrome)    Internal hemorrhoids    Lymphocytic colitis    Morbid obesity (HCC)    NICM (nonischemic cardiomyopathy) (HCC)    a. 1999 Cath: nl cors;  b. EF prev as low as 35%;  c. 11/2015 Echo: Ef 40-45%;  d. 06/2016 Echo: EF 40-45%;  e. Lexiscan MV: fixed anterosepta/inferseptal defect w/o ischemia, EF 35%.   Osteoarthritis    PONV (postoperative nausea and vomiting)    PVC (premature ventricular contraction)    Restless leg syndrome    Sjogren's syndrome (HCC)    Vertigo    Past Surgical History:  Procedure Laterality Date   ABDOMINAL HERNIA REPAIR     ABDOMINAL HYSTERECTOMY     CESAREAN SECTION     CHOLECYSTECTOMY  10/2008   COLONOSCOPY  02/14/2015    ESOPHAGOGASTRODUODENOSCOPY     EYE SURGERY     2 2013 and 1981/DCR of right eye 05/2014   LUMBAR DISC SURGERY  2016   L3-4   RIGHT/LEFT HEART CATH AND CORONARY ANGIOGRAPHY N/A 07/11/2016   Procedure: Right/Left Heart Cath and Coronary Angiography;  Surgeon: Peter M Swazil[place], MD;  Location: [facility] INVASIVE CV LAB;  Service: Cardiovascular;  Laterality: N/A;   SHOULDER ARTHROSCOPY W/ SUBACROMIAL DECOMPRESSION AND DISTAL CLAVICLE EXCISION Right    SPINE SURGERY     C6-C7, 03/2017   TUBAL LIGATION  1992   Patient Active Problem List   Diagnosis Date Noted   Helicobacter pylori gastritis 05/15/2021   Hormone replacement therapy (HRT) 07/29/2019   IBS (irritable bowel syndrome) 11/10/2017   B12 deficiency 11/10/2017   OSA (obstructive sleep apnea) 11/10/2017   Myalgia due to statin 11/10/2017   Lumbar disc disease 10/01/2017   RLS (restless legs syndrome) 10/01/2017   Myofascial pain dysfunction syndrome 07/14/2017   Cervical radiculopathy 07/14/2017   Degenerative arthritis of left knee, XRAY12/2018 07/13/2017   Morbid obesity (HCC) 02/24/2017   Sjogren's disease (HCC) 02/24/2017   Fibromyalgia 01/21/2017   Chronic low back pain with sciatica 05/02/2015   Nonischemic cardiomyopathy (HCC)  03/22/2015   Vitamin D deficiency 10/09/2009   Depression with anxiety 08/31/2008   Essential hypertension 08/31/2008   Allergic rhinitis 08/31/2008   Moderate persistent asthma 08/31/2008   Esophageal reflux 08/31/2008   Acquired hypothyroidism 08/30/2008   Classical migraine without intractable migraine 08/30/2008    PCP: Lavada Mesi MD   REFERRING PROVIDER: Janice Coffin, PA-C  REFERRING DIAG: [phone] (ICD-10-CM) - Myofascial pain dysfunction syndrome  THERAPY DIAG:  Cervicalgia  Abnormal posture  Muscle weakness (generalized)  Other symptoms and signs involving the musculoskeletal system  Rationale for Evaluation and Treatment: Rehabilitation  ONSET DATE: chronic   SUBJECTIVE:                                                                                                                                                                                                          SUBJECTIVE STATEMENT: Pt reports doing well, has not had a headache in a week!!! Was sore after last session but a good sore.     Hand dominance: Right  PERTINENT HISTORY:  Anemia, anxiety, asthma, hx carpal tunnel syndrome, CHF, chronic fatigue, fibromyalgia, HTN, hypothyroidism, IBS, morbid obesity, NICM, OA, RLS, sjogrens, vertigo, cholecystectomy, eye surgery. Lumbar disc surgery, SAD with distal clavicle excision, spine surgery   PAIN:  Are you having pain? Yes: NPRS scale: 0/10 Pain location: headache   Pain description: dull Aggravating factors: photophobia, noise sensitivity, posture  Relieving factors: unsure but tried to avoid uneven ground, fast turns, etc   PRECAUTIONS: Other: currently wearing heart monitor   RED FLAGS: None    WEIGHT BEARING RESTRICTIONS: No  FALLS:  Has patient fallen in last 6 months? No  LIVING ENVIRONMENT: Lives with: lives with their spouse Lives in: House/apartment Stairs: 2 STE with rail  Has following equipment at home: None  OCCUPATION: retired   PLOF: Independent, Independent with basic ADLs, Independent with gait, and Independent with transfers  PATIENT GOALS: work on posture, improve pain as much as able, find out why I'm feeling so bad, get back to water aerobics  NEXT MD VISIT: Referring in November   OBJECTIVE:   COGNITION: Overall cognitive status: Within functional limits for tasks assessed  POSTURE: rounded shoulders, forward head, increased lumbar lordosis, and increased thoracic kyphosis  PALPATION: B upper traps very sensitive with multiple trigger points and spasms, thoracic and cervical paraspinals tight and very tender, occipitals very tight and tender   CERVICAL ROM:   Active ROM A/PROM (deg) eval  Flexion 28*   Extension 9*  Right lateral flexion 15*  Left lateral flexion 10*  Right rotation 38*  Left rotation 38*   (Blank rows = not tested)   UPPER EXTREMITY MMT:  MMT Right eval Left eval  Shoulder flexion 4 3+  Shoulder extension    Shoulder abduction 3 4  Shoulder adduction    Shoulder extension    Shoulder internal rotation    Shoulder external rotation    Middle trapezius    Lower trapezius    Elbow flexion    Elbow extension    Wrist flexion    Wrist extension    Wrist ulnar deviation    Wrist radial deviation    Wrist pronation    Wrist supination    Grip strength    Hip flexion  3- 3-  Knee extension  4 4  Ankle dorsiflexion  5 5  Hip ABD seated  3+  3+   (Blank rows = not tested)    FUNCTIONAL TESTS:  5 times sit to stand: 14.4 seconds no UEs  Gait speed 0.9 m/s no device   TODAY'S TREATMENT:                                                                                                                              Patient seen for aquatic therapy today.  Treatment took place in water 3.6-4.0 feet deep depending upon activity.  Pt entered and exited the pool via stairs with use of rail.  Pool temp approx 92 deg.   Warm up:  Pt ambulated forwards x approx 18' x 4 laps, backwards x 4 laps and side stepping (holding yellow barbells by side) x 4 laps.  Performed all without UE support.  Good speed and posture.    Balance/LE strength:Marching forward holding smaller barbells for support x 2 laps>added in opposite arm motions with barbells x 2 more laps.  Intermittent cues for slower speed to emphasize SLS/balance challenge.  Standing hip flex/hip abd/hip ext holding onto yellow pool noodle.  Pt demos improved overall balance today esp when educated to slow down and improve stance leg mm activation.    Core/balance: Trialed her sitting "saddle" sit on yellow pool noodle by wall for intermittent support.  She does very well maintaining balance without support and then  bicycling across pool x 2 laps.  Switched to "Swing" sitting on noodle, again maintaining balance without UE support>alt LE kicks x 10 reps>alt UE raises x 10 reps with tactile and verbal cues for trunk shortening on opposite side of arm.  Plank position with arms on pool noodle in water, slowly elevating noodle to surface then back into water x 10 reps.    Ended with yoga type movements:  in warrior 2 position rotating noodle across surface of pool then elevating and rotating back to start position x 10 reps each side.    Pt requires buoyancy of water for support for reduced fall risk and for unloading/reduced stress on joints (spine) as pt able to tolerate increased standing and ambulation  in water compared to that on land; viscosity of water is needed for resistance for strengthening and current of water provides perturbations for challenge for balance training       PATIENT EDUCATION:  Education details: aquatic rationale Person educated: Patient Education method: Explanation, Demonstration, and Handouts Education comprehension: verbalized understanding, returned demonstration, and needs further education  HOME EXERCISE PROGRAM: Access Code: [email or web address] URL: [email or web address] Date: 01/01/2023 Prepared by: Nedra Hai  Exercises - Seated Cervical Retraction  - 1-2 x daily - 7 x weekly - 1 sets - 10 reps - 3 seconds  hold - Seated Upper Trapezius Stretch  - 1 x daily - 7 x weekly - 3 sets - 10 reps - Seated Scapular Retraction  - 1 x daily - 7 x weekly - 3 sets - 10 reps - Seated Scalenes Stretch  - 1 x daily - 7 x weekly - 3 sets - 10 reps - Supine Suboccipital Release with Tennis Balls  - 1 x daily - 7 x weekly - 1 sets - 5-10 min hold  ASSESSMENT:  CLINICAL IMPRESSION: Pt making excellent progress with balance and core strengthening today.  She is able to perform tasks seated on yellow pool noodle today with only light external support.  She is also able to  tolerate more activity in SLS.      OBJECTIVE IMPAIRMENTS: decreased activity tolerance, decreased mobility, difficulty walking, decreased strength, increased fascial restrictions, impaired perceived functional ability, increased muscle spasms, impaired flexibility, improper body mechanics, postural dysfunction, obesity, and pain.   ACTIVITY LIMITATIONS: carrying, lifting, bathing, toileting, dressing, reach over head, hygiene/grooming, locomotion level, and caring for others  PARTICIPATION LIMITATIONS: meal prep, cleaning, laundry, driving, shopping, community activity, and yard work  PERSONAL FACTORS: Age, Fitness, Past/current experiences, Social background, and Time since onset of injury/illness/exacerbation are also affecting patient's functional outcome.   REHAB POTENTIAL: Fair chronicity of condition/pain   CLINICAL DECISION MAKING: Stable/uncomplicated  EVALUATION COMPLEXITY: Low   GOALS: Goals reviewed with patient? Yes  SHORT TERM GOALS: Target date: 01/29/2023    Will be compliant with appropriate progressive HEP  Baseline:  Goal status: INITIAL  2.  Cervical ROM to improve by at least 10 degrees all planes of motion  Baseline:  Goal status: INITIAL  3.  Will demonstrate improved awareness of functional posture with use of ergonomic aides at home and in the car PRN/as desired  Baseline:  Goal status: INITIAL  4.  Pain to be no more than 3/10 at worst  Baseline:  Goal status: INITIAL    LONG TERM GOALS: Target date: 02/26/2023    MMT to improve by at least 1 grade in all weak groups  Baseline:  Goal status: INITIAL  2.  Muscle spasms to have improved by at least 50% to assist in long term pain reduction and management  Baseline:  Goal status: INITIAL  3.  Will be able to perform all functional ADLs at home without increase in pain or feelings of weakness/fatigue  Baseline:  Goal status: INITIAL  4.  Will be able to independently participate in water  aerobics multiple times per week without increase in pain or feelings of severe weakness/fatigue  Baseline:  Goal status: INITIAL     PLAN:  PT FREQUENCY: 2x/week  PT DURATION: 8 weeks  PLANNED INTERVENTIONS: Therapeutic exercises, Therapeutic activity, Patient/Family education, Self Care, Joint mobilization, Aquatic Therapy, Dry Needling, Electrical stimulation, Spinal mobilization, Cryotherapy, Moist heat, Taping, Ultrasound, Ionotophoresis 4mg /ml Dexamethasone, Manual therapy, and Re-evaluation  PLAN FOR  NEXT SESSION: 1x/week in water, 1x/week on land  Land: cervical ROM and strength, general strengthening and endurance (UEs and LEs), manual and even DN as desired, look at shoulder ROM (has limited abduction in sitting, can benefit from assessment in supine and ROM/stretching as needed), how did she feel after DN and use of Chirp wheel? STG assess  Aquatic: any mobility for UB, cervical, thoracic, and lumbar regions for pain management and to increase tolerance for movement  Harriet Butte, PT, MPT [facility] [address] [place], [place], [address] Phone: [phone]   Fax:  [phone] 02/04/23, 2:11 PM

## 2023-02-06 ENCOUNTER — Encounter: Payer: Self-pay | Admitting: Physical Therapy

## 2023-02-06 ENCOUNTER — Ambulatory Visit: Payer: Medicare Other | Admitting: Physical Therapy

## 2023-02-06 VITALS — BP 122/71 | HR 79

## 2023-02-06 DIAGNOSIS — R293 Abnormal posture: Secondary | ICD-10-CM

## 2023-02-06 DIAGNOSIS — M542 Cervicalgia: Secondary | ICD-10-CM

## 2023-02-06 DIAGNOSIS — M6281 Muscle weakness (generalized): Secondary | ICD-10-CM

## 2023-02-06 DIAGNOSIS — R29898 Other symptoms and signs involving the musculoskeletal system: Secondary | ICD-10-CM

## 2023-02-06 NOTE — Therapy (Addendum)
OUTPATIENT PHYSICAL THERAPY CERVICAL TREATMENT   Patient Name: Kristine Curtis MRN: [id number] DOB:[birth year], 62 y.o., female Today's Date: 02/06/2023  END OF SESSION:  PT End of Session - 02/06/23 1108     Visit Number 7    Number of Visits 17    Date for PT Re-Evaluation 02/26/23    Authorization Type [employer] MCR    Authorization Time Period 01/01/23 to 03/12/23    Progress Note Due on Visit 10    PT Start Time 1108    PT Stop Time 1146    PT Time Calculation (min) 38 min    Equipment Utilized During Treatment Other (comment)    Activity Tolerance Patient tolerated treatment well    Behavior During Therapy WFL for tasks assessed/performed              Past Medical History:  Diagnosis Date   Allergic rhinitis due to pollen    Anemia    Anxiety    Asthma    Carpal tunnel syndrome of right wrist 12/22/2017   Cataract    Chronic combined systolic and diastolic CHF (congestive heart failure) (HCC)    06/2016 Echo: EF 40-45%, Gr1 DD   Chronic fatigue    Depression    Family history of polycystic kidney with negative CT 11/16/2012   Fibromyalgia    GERD (gastroesophageal reflux disease)    Helicobacter pylori gastritis 05/2020   Hypertension    Hypothyroidism    IBS (irritable bowel syndrome)    Internal hemorrhoids    Lymphocytic colitis    Morbid obesity (HCC)    NICM (nonischemic cardiomyopathy) (HCC)    a. 1999 Cath: nl cors;  b. EF prev as low as 35%;  c. 11/2015 Echo: Ef 40-45%;  d. 06/2016 Echo: EF 40-45%;  e. Lexiscan MV: fixed anterosepta/inferseptal defect w/o ischemia, EF 35%.   Osteoarthritis    PONV (postoperative nausea and vomiting)    PVC (premature ventricular contraction)    Restless leg syndrome    Sjogren's syndrome (HCC)    Vertigo    Past Surgical History:  Procedure Laterality Date   ABDOMINAL HERNIA REPAIR     ABDOMINAL HYSTERECTOMY     CESAREAN SECTION     CHOLECYSTECTOMY  10/2008   COLONOSCOPY  02/14/2015    ESOPHAGOGASTRODUODENOSCOPY     EYE SURGERY     2 2013 and 1981/DCR of right eye 05/2014   LUMBAR DISC SURGERY  2016   L3-4   RIGHT/LEFT HEART CATH AND CORONARY ANGIOGRAPHY N/A 07/11/2016   Procedure: Right/Left Heart Cath and Coronary Angiography;  Surgeon: Peter M [place], MD;  Location: [facility] INVASIVE CV LAB;  Service: Cardiovascular;  Laterality: N/A;   SHOULDER ARTHROSCOPY W/ SUBACROMIAL DECOMPRESSION AND DISTAL CLAVICLE EXCISION Right    SPINE SURGERY     C6-C7, 03/2017   TUBAL LIGATION  1992   Patient Active Problem List   Diagnosis Date Noted   Helicobacter pylori gastritis 05/15/2021   Hormone replacement therapy (HRT) 07/29/2019   IBS (irritable bowel syndrome) 11/10/2017   B12 deficiency 11/10/2017   OSA (obstructive sleep apnea) 11/10/2017   Myalgia due to statin 11/10/2017   Lumbar disc disease 10/01/2017   RLS (restless legs syndrome) 10/01/2017   Myofascial pain dysfunction syndrome 07/14/2017   Cervical radiculopathy 07/14/2017   Degenerative arthritis of left knee, XRAY12/2018 07/13/2017   Morbid obesity (HCC) 02/24/2017   Sjogren's disease (HCC) 02/24/2017   Fibromyalgia 01/21/2017   Chronic low back pain with sciatica 05/02/2015   Nonischemic  cardiomyopathy (HCC) 03/22/2015   Vitamin D deficiency 10/09/2009   Depression with anxiety 08/31/2008   Essential hypertension 08/31/2008   Allergic rhinitis 08/31/2008   Moderate persistent asthma 08/31/2008   Esophageal reflux 08/31/2008   Acquired hypothyroidism 08/30/2008   Classical migraine without intractable migraine 08/30/2008    PCP: Lavada Mesi MD   REFERRING PROVIDER: Janice Coffin, PA-C  REFERRING DIAG: [phone] (ICD-10-CM) - Myofascial pain dysfunction syndrome  THERAPY DIAG:  Cervicalgia  Abnormal posture  Other symptoms and signs involving the musculoskeletal system  Muscle weakness (generalized)  Rationale for Evaluation and Treatment: Rehabilitation  ONSET DATE: chronic   SUBJECTIVE:                                                                                                                                                                                                          SUBJECTIVE STATEMENT: Pt reports that she does have a migraine today ~5/10. Not as bad as one she had a few weeks ago. Denies other acute changes/falls/near falls. Patient reports that with her headaches she notices her shoulder and neck are tighter.   Hand dominance: Right  PERTINENT HISTORY:  Anemia, anxiety, asthma, hx carpal tunnel syndrome, CHF, chronic fatigue, fibromyalgia, HTN, hypothyroidism, IBS, morbid obesity, NICM, OA, RLS, sjogrens, vertigo, cholecystectomy, eye surgery. Lumbar disc surgery, SAD with distal clavicle excision, spine surgery   PAIN:  Are you having pain? Yes: NPRS scale: 5/10 Pain location: headache   Pain description: dull Aggravating factors: photophobia, noise sensitivity, posture  Relieving factors: unsure but tried to avoid uneven ground, fast turns, etc   PRECAUTIONS: Other: currently wearing heart monitor   RED FLAGS: None    WEIGHT BEARING RESTRICTIONS: No  FALLS:  Has patient fallen in last 6 months? No  LIVING ENVIRONMENT: Lives with: lives with their spouse Lives in: House/apartment Stairs: 2 STE with rail  Has following equipment at home: None  OCCUPATION: retired   PLOF: Independent, Independent with basic ADLs, Independent with gait, and Independent with transfers  PATIENT GOALS: work on posture, improve pain as much as able, find out why I'm feeling so bad, get back to water aerobics  NEXT MD VISIT: Referring in November   OBJECTIVE:   COGNITION: Overall cognitive status: Within functional limits for tasks assessed  POSTURE: rounded shoulders, forward head, increased lumbar lordosis, and increased thoracic kyphosis  PALPATION: B upper traps very sensitive with multiple trigger points and spasms, thoracic and cervical paraspinals  tight and very tender, occipitals very tight and tender   CERVICAL ROM:   Active ROM  A/PROM (deg) eval  Flexion 28*  Extension 9*  Right lateral flexion 15*  Left lateral flexion 10*  Right rotation 38*  Left rotation 38*   (Blank rows = not tested)   UPPER EXTREMITY MMT:  MMT Right eval Left eval  Shoulder flexion 4 3+  Shoulder extension    Shoulder abduction 3 4  Shoulder adduction    Shoulder extension    Shoulder internal rotation    Shoulder external rotation    Middle trapezius    Lower trapezius    Elbow flexion    Elbow extension    Wrist flexion    Wrist extension    Wrist ulnar deviation    Wrist radial deviation    Wrist pronation    Wrist supination    Grip strength    Hip flexion  3- 3-  Knee extension  4 4  Ankle dorsiflexion  5 5  Hip ABD seated  3+  3+   (Blank rows = not tested)    FUNCTIONAL TESTS:  5 times sit to stand: 14.4 seconds no UEs  Gait speed 0.9 m/s no device   TODAY'S TREATMENT:                                                                                                                              Vitals:   02/06/23 1113  BP: 122/71  Pulse: 79   Assessed STG see below for details  CERVICAL ROM:    Active ROM A/PROM (deg) eval  Flexion 50  Extension 46  Right lateral flexion 40  Left lateral flexion 50  Right rotation 62  Left rotation 72   (Blank rows = not tested)  Shoulder abduction on LUE: WFL and catches 100 degrees and compensates on RUE  TherEx:  AAROM supine with trekking pole shoulder flexion 2 x 10 AAROM supine with trekking pole shoulder abduction 2 x 10  AROM supine shoulder abduction and flexion on RUE x 10 Wall slides on RUE with towel roll 1 x 10 (reports less pain with use of wall than with supine exercises) Shoulder isometric external rotation 1 x 10 with 5 second holds Shoulder isometric abductions 1 x 10 with 5 holds  PreTx Pain: 5/10 PostTx Pain: 3/10  PATIENT EDUCATION:   Education details: Continue HEP Person educated: Patient Education method: Explanation, Demonstration, and Handouts Education comprehension: verbalized understanding, returned demonstration, and needs further education  HOME EXERCISE PROGRAM: Access Code: [email or web address] URL: [email or web address] Date: 02/06/2023 Prepared by: Maryruth Eve  Exercises - Seated Cervical Retraction  - 1-2 x daily - 7 x weekly - 1 sets - 10 reps - 3 seconds  hold - Seated Upper Trapezius Stretch  - 1 x daily - 7 x weekly - 3 sets - 10 reps - Seated Scapular Retraction  - 1 x daily - 7 x weekly - 3 sets - 10 reps - Seated Scalenes Stretch  - 1 x daily - 7 x  weekly - 3 sets - 10 reps - Supine Suboccipital Release with Tennis Balls  - 1 x daily - 7 x weekly - 1 sets - 5-10 min hold - Supine Shoulder Flexion with Dowel  - 1 x daily - 7 x weekly - 3 sets - 10 reps - Supine Shoulder Abduction AAROM with Dowel  - 1 x daily - 7 x weekly - 3 sets - 10 reps - Shoulder Flexion Wall Slide with Towel  - 1 x daily - 7 x weekly - 3 sets - 10 reps - Standing Shoulder Abduction Slides at Wall  - 1 x daily - 7 x weekly - 3 sets - 10 reps  ASSESSMENT:  CLINICAL IMPRESSION: Patient tolerated session well and demonstrated reduction in pain from 5/10 to 3/10. Patient demonstrated improved shoulder abduction with use of wall stretch versus supine so added to HEP. Reduction in muscular tightness corresponds with reduction in patient's headache symptoms. Patient achieved 2/3 active STGs and is progressing on pain goal. Continue POC.   OBJECTIVE IMPAIRMENTS: decreased activity tolerance, decreased mobility, difficulty walking, decreased strength, increased fascial restrictions, impaired perceived functional ability, increased muscle spasms, impaired flexibility, improper body mechanics, postural dysfunction, obesity, and pain.   ACTIVITY LIMITATIONS: carrying, lifting, bathing, toileting, dressing, reach over head,  hygiene/grooming, locomotion level, and caring for others  PARTICIPATION LIMITATIONS: meal prep, cleaning, laundry, driving, shopping, community activity, and yard work  PERSONAL FACTORS: Age, Fitness, Past/current experiences, Social background, and Time since onset of injury/illness/exacerbation are also affecting patient's functional outcome.   REHAB POTENTIAL: Fair chronicity of condition/pain   CLINICAL DECISION MAKING: Stable/uncomplicated  EVALUATION COMPLEXITY: Low   GOALS: Goals reviewed with patient? Yes  SHORT TERM GOALS: Target date: 01/29/2023    Will be compliant with appropriate progressive HEP  Baseline: Reports that exercises are going well Goal status: MET  2.  Cervical ROM to improve by at least 10 degrees all planes of motion  Baseline: See above for scoring > 10* degrees for all Goal status: MET  3.  Will demonstrate improved awareness of functional posture with use of ergonomic aides at home and in the car PRN/as desired  Baseline: Discontinued unable to assess in clinic Goal status: Discontinued unable to assess in clinic  4.  Pain to be no more than 3/10 at worst  Baseline: Reports changes 5/10 Goal status: NOT MET    LONG TERM GOALS: Target date: 02/26/2023    MMT to improve by at least 1 grade in all weak groups  Baseline:  Goal status: INITIAL  2.  Muscle spasms to have improved by at least 50% to assist in long term pain reduction and management  Baseline:  Goal status: INITIAL  3.  Will be able to perform all functional ADLs at home without increase in pain or feelings of weakness/fatigue  Baseline:  Goal status: INITIAL  4.  Will be able to independently participate in water aerobics multiple times per week without increase in pain or feelings of severe weakness/fatigue  Baseline:  Goal status: INITIAL     PLAN:  PT FREQUENCY: 2x/week  PT DURATION: 8 weeks  PLANNED INTERVENTIONS: Therapeutic exercises, Therapeutic activity,  Patient/Family education, Self Care, Joint mobilization, Aquatic Therapy, Dry Needling, Electrical stimulation, Spinal mobilization, Cryotherapy, Moist heat, Taping, Ultrasound, Ionotophoresis 4mg /ml Dexamethasone, Manual therapy, and Re-evaluation  PLAN FOR NEXT SESSION: 1x/week in water, 1x/week on land  Land: cervical ROM and strength, general strengthening and endurance (UEs and LEs), manual and even DN as desired,  continue to manage shoulder mobility particular shoulder abduction as needed  Aquatic: any mobility for UB, cervical, thoracic, and lumbar regions for pain management and to increase tolerance for movement  Maryruth Eve, PT, DPT 02/06/23, 12:19 PM

## 2023-02-11 ENCOUNTER — Ambulatory Visit: Payer: Medicare Other | Admitting: Rehabilitation

## 2023-02-12 ENCOUNTER — Other Ambulatory Visit: Payer: Self-pay | Admitting: Internal Medicine

## 2023-02-13 ENCOUNTER — Encounter: Payer: Self-pay | Admitting: Physical Therapy

## 2023-02-13 ENCOUNTER — Ambulatory Visit: Payer: Medicare Other | Admitting: Physical Therapy

## 2023-02-13 VITALS — BP 129/70 | HR 92

## 2023-02-13 DIAGNOSIS — M6281 Muscle weakness (generalized): Secondary | ICD-10-CM

## 2023-02-13 DIAGNOSIS — M542 Cervicalgia: Secondary | ICD-10-CM

## 2023-02-13 DIAGNOSIS — R293 Abnormal posture: Secondary | ICD-10-CM

## 2023-02-13 DIAGNOSIS — R29898 Other symptoms and signs involving the musculoskeletal system: Secondary | ICD-10-CM

## 2023-02-13 NOTE — Therapy (Signed)
OUTPATIENT PHYSICAL THERAPY CERVICAL TREATMENT   Patient Name: Kristine Curtis MRN: [id number] DOB:[birth year], 62 y.o., female Today's Date: 02/13/2023  END OF SESSION:  PT End of Session - 02/13/23 1018     Visit Number 8    Number of Visits 17    Date for PT Re-Evaluation 02/26/23    Authorization Type UHC MCR    Authorization Time Period 01/01/23 to 03/12/23    Progress Note Due on Visit 10    PT Start Time 1018    PT Stop Time 1057    PT Time Calculation (min) 39 min    Equipment Utilized During Treatment Gait belt    Activity Tolerance Patient tolerated treatment well    Behavior During Therapy WFL for tasks assessed/performed              Past Medical History:  Diagnosis Date   Allergic rhinitis due to pollen    Anemia    Anxiety    Asthma    Carpal tunnel syndrome of right wrist 12/22/2017   Cataract    Chronic combined systolic and diastolic CHF (congestive heart failure) (HCC)    06/2016 Echo: EF 40-45%, Gr1 DD   Chronic fatigue    Depression    Family history of polycystic kidney with negative CT 11/16/2012   Fibromyalgia    GERD (gastroesophageal reflux disease)    Helicobacter pylori gastritis 05/2020   Hypertension    Hypothyroidism    IBS (irritable bowel syndrome)    Internal hemorrhoids    Lymphocytic colitis    Morbid obesity (HCC)    NICM (nonischemic cardiomyopathy) (HCC)    a. 1999 Cath: nl cors;  b. EF prev as low as 35%;  c. 11/2015 Echo: Ef 40-45%;  d. 06/2016 Echo: EF 40-45%;  e. Lexiscan MV: fixed anterosepta/inferseptal defect w/o ischemia, EF 35%.   Osteoarthritis    PONV (postoperative nausea and vomiting)    PVC (premature ventricular contraction)    Restless leg syndrome    Sjogren's syndrome (HCC)    Vertigo    Past Surgical History:  Procedure Laterality Date   ABDOMINAL HERNIA REPAIR     ABDOMINAL HYSTERECTOMY     CESAREAN SECTION     CHOLECYSTECTOMY  10/2008   COLONOSCOPY  02/14/2015   ESOPHAGOGASTRODUODENOSCOPY      EYE SURGERY     2 2013 and 1981/DCR of right eye 05/2014   LUMBAR DISC SURGERY  2016   L3-4   RIGHT/LEFT HEART CATH AND CORONARY ANGIOGRAPHY N/A 07/11/2016   Procedure: Right/Left Heart Cath and Coronary Angiography;  Surgeon: Peter M Swaziland, MD;  Location: [facility] INVASIVE CV LAB;  Service: Cardiovascular;  Laterality: N/A;   SHOULDER ARTHROSCOPY W/ SUBACROMIAL DECOMPRESSION AND DISTAL CLAVICLE EXCISION Right    SPINE SURGERY     C6-C7, 03/2017   TUBAL LIGATION  1992   Patient Active Problem List   Diagnosis Date Noted   Helicobacter pylori gastritis 05/15/2021   Hormone replacement therapy (HRT) 07/29/2019   IBS (irritable bowel syndrome) 11/10/2017   B12 deficiency 11/10/2017   OSA (obstructive sleep apnea) 11/10/2017   Myalgia due to statin 11/10/2017   Lumbar disc disease 10/01/2017   RLS (restless legs syndrome) 10/01/2017   Myofascial pain dysfunction syndrome 07/14/2017   Cervical radiculopathy 07/14/2017   Degenerative arthritis of left knee, XRAY12/2018 07/13/2017   Morbid obesity (HCC) 02/24/2017   Sjogren's disease (HCC) 02/24/2017   Fibromyalgia 01/21/2017   Chronic low back pain with sciatica 05/02/2015   Nonischemic  cardiomyopathy (HCC) 03/22/2015   Vitamin D deficiency 10/09/2009   Depression with anxiety 08/31/2008   Essential hypertension 08/31/2008   Allergic rhinitis 08/31/2008   Moderate persistent asthma 08/31/2008   Esophageal reflux 08/31/2008   Acquired hypothyroidism 08/30/2008   Classical migraine without intractable migraine 08/30/2008    PCP: Lavada Mesi MD   REFERRING PROVIDER: Janice Coffin, PA-C  REFERRING DIAG: [phone] (ICD-10-CM) - Myofascial pain dysfunction syndrome  THERAPY DIAG:  Cervicalgia  Other symptoms and signs involving the musculoskeletal system  Abnormal posture  Muscle weakness (generalized)  Rationale for Evaluation and Treatment: Rehabilitation  ONSET DATE: chronic   SUBJECTIVE:                                                                                                                                                                                                          SUBJECTIVE STATEMENT: Pt reports that she had to call of of pool therapy on Tuesday due to a migraine. Patient reports that symptoms seem to onset after being with kids for several hours. Denies falls/near falls. Patient reports that overall she feels like therapy has helped.   Hand dominance: Right  PERTINENT HISTORY:  Anemia, anxiety, asthma, hx carpal tunnel syndrome, CHF, chronic fatigue, fibromyalgia, HTN, hypothyroidism, IBS, morbid obesity, NICM, OA, RLS, sjogrens, vertigo, cholecystectomy, eye surgery. Lumbar disc surgery, SAD with distal clavicle excision, spine surgery   PAIN:  Are you having pain? Yes: NPRS scale: 2-3/10 Pain location: headache   Pain description: dull Aggravating factors: photophobia, noise sensitivity, posture  Relieving factors: unsure but tried to avoid uneven ground, fast turns, etc   PRECAUTIONS: Other: currently wearing heart monitor   RED FLAGS: None    WEIGHT BEARING RESTRICTIONS: No  FALLS:  Has patient fallen in last 6 months? No  LIVING ENVIRONMENT: Lives with: lives with their spouse Lives in: House/apartment Stairs: 2 STE with rail  Has following equipment at home: None  OCCUPATION: retired   PLOF: Independent, Independent with basic ADLs, Independent with gait, and Independent with transfers  PATIENT GOALS: work on posture, improve pain as much as able, find out why I'm feeling so bad, get back to water aerobics  NEXT MD VISIT: Referring in November   OBJECTIVE:   COGNITION: Overall cognitive status: Within functional limits for tasks assessed  POSTURE: rounded shoulders, forward head, increased lumbar lordosis, and increased thoracic kyphosis  PALPATION: B upper traps very sensitive with multiple trigger points and spasms, thoracic and cervical paraspinals  tight and very tender, occipitals very tight and tender   CERVICAL ROM:  Active ROM A/PROM (deg) eval  Flexion 28*  Extension 9*  Right lateral flexion 15*  Left lateral flexion 10*  Right rotation 38*  Left rotation 38*   (Blank rows = not tested)   UPPER EXTREMITY MMT:  MMT Right eval Left eval  Shoulder flexion 4 3+  Shoulder extension    Shoulder abduction 3 4  Shoulder adduction    Shoulder extension    Shoulder internal rotation    Shoulder external rotation    Middle trapezius    Lower trapezius    Elbow flexion    Elbow extension    Wrist flexion    Wrist extension    Wrist ulnar deviation    Wrist radial deviation    Wrist pronation    Wrist supination    Grip strength    Hip flexion  3- 3-  Knee extension  4 4  Ankle dorsiflexion  5 5  Hip ABD seated  3+  3+   (Blank rows = not tested)  FUNCTIONAL TESTS:  5 times sit to stand: 14.4 seconds no UEs  Gait speed 0.9 m/s no device   TODAY'S TREATMENT:                                                                                                                              Vitals:   02/13/23 1028  BP: 129/70  Pulse: 92    TherEx:  Chin tuck with wall angel and shoulder ER 2 x 10 (shoulder ER limited) Resisted chin tuck with red theraband in standing 2 x 10 Pushup plus on wall with emphasis on scap retraction 2 x 10 (limited on scap retraction on wall) Shoulder flexion wall slides 2 x 10 Shoulder abduction wall slides 2 x 10 Wall plank with shoulder abduction taps with red theraband 3 x 8  PreTx Pain: 2-3/10 PostTx Pain: 2-3/10  PATIENT EDUCATION:  Education details: Continue HEP Person educated: Patient Education method: Programmer, multimedia, Facilities manager, and Handouts Education comprehension: verbalized understanding, returned demonstration, and needs further education  HOME EXERCISE PROGRAM: Access Code: [email or web address] URL: [email or web address] Date: 02/06/2023 Prepared by: Maryruth Eve  Exercises - Seated Cervical Retraction  - 1-2 x daily - 7 x weekly - 1 sets - 10 reps - 3 seconds  hold - Seated Upper Trapezius Stretch  - 1 x daily - 7 x weekly - 3 sets - 10 reps - Seated Scapular Retraction  - 1 x daily - 7 x weekly - 3 sets - 10 reps - Seated Scalenes Stretch  - 1 x daily - 7 x weekly - 3 sets - 10 reps - Supine Suboccipital Release with Tennis Balls  - 1 x daily - 7 x weekly - 1 sets - 5-10 min hold - Supine Shoulder Flexion with Dowel  - 1 x daily - 7 x weekly - 3 sets - 10 reps - Supine Shoulder Abduction AAROM with Dowel  - 1 x daily - 7  x weekly - 3 sets - 10 reps - Shoulder Flexion Wall Slide with Towel  - 1 x daily - 7 x weekly - 3 sets - 10 reps - Standing Shoulder Abduction Slides at Wall  - 1 x daily - 7 x weekly - 3 sets - 10 reps  ASSESSMENT:  CLINICAL IMPRESSION: Session emphasized progression of postural control and stability to help manage migraine and headache symptoms. Patient tolerated session well. Demonstrated reduced shoulder ER with wall angel exercises but improved ease of mobility with repetition. Also noted difficulty achieving full ROM of scap retraction with pushup plus likely due to weakness in retractors with increase strength demands. Patient overall satisfied with progress and feeling ready for D/C in 2 visits on the 17th. Continue POC.   OBJECTIVE IMPAIRMENTS: decreased activity tolerance, decreased mobility, difficulty walking, decreased strength, increased fascial restrictions, impaired perceived functional ability, increased muscle spasms, impaired flexibility, improper body mechanics, postural dysfunction, obesity, and pain.   ACTIVITY LIMITATIONS: carrying, lifting, bathing, toileting, dressing, reach over head, hygiene/grooming, locomotion level, and caring for others  PARTICIPATION LIMITATIONS: meal prep, cleaning, laundry, driving, shopping, community activity, and yard work  PERSONAL FACTORS: Age, Fitness, Past/current  experiences, Social background, and Time since onset of injury/illness/exacerbation are also affecting patient's functional outcome.   REHAB POTENTIAL: Fair chronicity of condition/pain   CLINICAL DECISION MAKING: Stable/uncomplicated  EVALUATION COMPLEXITY: Low   GOALS: Goals reviewed with patient? Yes  SHORT TERM GOALS: Target date: 01/29/2023    Will be compliant with appropriate progressive HEP  Baseline: Reports that exercises are going well Goal status: MET  2.  Cervical ROM to improve by at least 10 degrees all planes of motion  Baseline: See above for scoring > 10* degrees for all Goal status: MET  3.  Will demonstrate improved awareness of functional posture with use of ergonomic aides at home and in the car PRN/as desired  Baseline: Discontinued unable to assess in clinic Goal status: Discontinued unable to assess in clinic  4.  Pain to be no more than 3/10 at worst  Baseline: Reports changes 5/10 Goal status: NOT MET    LONG TERM GOALS: Target date: 02/26/2023    MMT to improve by at least 1 grade in all weak groups  Baseline:  Goal status: INITIAL  2.  Muscle spasms to have improved by at least 50% to assist in long term pain reduction and management  Baseline:  Goal status: INITIAL  3.  Will be able to perform all functional ADLs at home without increase in pain or feelings of weakness/fatigue  Baseline:  Goal status: INITIAL  4.  Will be able to independently participate in water aerobics multiple times per week without increase in pain or feelings of severe weakness/fatigue  Baseline:  Goal status: INITIAL  PLAN:  PT FREQUENCY: 2x/week  PT DURATION: 8 weeks  PLANNED INTERVENTIONS: Therapeutic exercises, Therapeutic activity, Patient/Family education, Self Care, Joint mobilization, Aquatic Therapy, Dry Needling, Electrical stimulation, Spinal mobilization, Cryotherapy, Moist heat, Taping, Ultrasound, Ionotophoresis 4mg /ml Dexamethasone, Manual  therapy, and Re-evaluation  PLAN FOR NEXT SESSION: 1x/week in water, 1x/week on land  Land: cervical ROM and strength, general strengthening and endurance (UEs and LEs), manual and even DN as desired, continue to manage shoulder mobility particular shoulder abduction as needed, consider adding wall angels or pushup plus to HEP pending tolerance last session Plan for early D/C on the 17th (patient in agreement), check goals  Aquatic: any mobility for UB, cervical, thoracic, and lumbar regions for  pain management and to increase tolerance for movement Plan for early D/C on the 17th (patient in agreement) - next pool visit to be last pool visit  Maryruth Eve, PT, DPT 02/13/23, 10:58 AM

## 2023-02-18 ENCOUNTER — Ambulatory Visit: Payer: Medicare Other | Admitting: Rehabilitation

## 2023-02-20 ENCOUNTER — Ambulatory Visit: Payer: Medicare Other | Admitting: Physical Therapy

## 2023-02-20 DIAGNOSIS — M542 Cervicalgia: Secondary | ICD-10-CM

## 2023-02-20 DIAGNOSIS — R293 Abnormal posture: Secondary | ICD-10-CM

## 2023-02-20 DIAGNOSIS — M6281 Muscle weakness (generalized): Secondary | ICD-10-CM

## 2023-02-20 NOTE — Therapy (Signed)
OUTPATIENT PHYSICAL THERAPY CERVICAL TREATMENT - DISCHARGE NOTE   Patient Name: VAANI MORREN MRN: [id number] DOB:03-14-1961, 62 y.o., female Today's Date: 02/20/2023   PHYSICAL THERAPY DISCHARGE SUMMARY  Visits from Start of Care: 9  Current functional level related to goals / functional outcomes: Independent   Remaining deficits: Does have onset of pain with prolonged periods of inactivity, improves with mobility   Education / Equipment: Handout for HEP and where to purchase [employer]   Patient agrees to discharge. Patient goals were met. Patient is being discharged due to meeting the stated rehab goals.   END OF SESSION:  PT End of Session - 02/20/23 1109     Visit Number 9    Number of Visits 17    Date for PT Re-Evaluation 02/26/23    Authorization Type UHC MCR    Authorization Time Period 01/01/23 to 03/12/23    Progress Note Due on Visit 10    PT Start Time 1107   this therapist running late   PT Stop Time 1128   d/c   PT Time Calculation (min) 21 min    Equipment Utilized During Treatment Gait belt    Activity Tolerance Patient tolerated treatment well    Behavior During Therapy WFL for tasks assessed/performed               Past Medical History:  Diagnosis Date   Allergic rhinitis due to pollen    Anemia    Anxiety    Asthma    Carpal tunnel syndrome of right wrist 12/22/2017   Cataract    Chronic combined systolic and diastolic CHF (congestive heart failure) (HCC)    06/2016 Echo: EF 40-45%, Gr1 DD   Chronic fatigue    Depression    Family history of polycystic kidney with negative CT 11/16/2012   Fibromyalgia    GERD (gastroesophageal reflux disease)    Helicobacter pylori gastritis 05/2020   Hypertension    Hypothyroidism    IBS (irritable bowel syndrome)    Internal hemorrhoids    Lymphocytic colitis    Morbid obesity (HCC)    NICM (nonischemic cardiomyopathy) (HCC)    a. 1999 Cath: nl cors;  b. EF prev as low as 35%;  c.  11/2015 Echo: Ef 40-45%;  d. 06/2016 Echo: EF 40-45%;  e. Lexiscan MV: fixed anterosepta/inferseptal defect w/o ischemia, EF 35%.   Osteoarthritis    PONV (postoperative nausea and vomiting)    PVC (premature ventricular contraction)    Restless leg syndrome    Sjogren's syndrome (HCC)    Vertigo    Past Surgical History:  Procedure Laterality Date   ABDOMINAL HERNIA REPAIR     ABDOMINAL HYSTERECTOMY     CESAREAN SECTION     CHOLECYSTECTOMY  10/2008   COLONOSCOPY  02/14/2015   ESOPHAGOGASTRODUODENOSCOPY     EYE SURGERY     2 2013 and 1981/DCR of right eye 05/2014   LUMBAR DISC SURGERY  2016   L3-4   RIGHT/LEFT HEART CATH AND CORONARY ANGIOGRAPHY N/A 07/11/2016   Procedure: Right/Left Heart Cath and Coronary Angiography;  Surgeon: Peter M Swazil[place], MD;  Location: [facility] INVASIVE CV LAB;  Service: Cardiovascular;  Laterality: N/A;   SHOULDER ARTHROSCOPY W/ SUBACROMIAL DECOMPRESSION AND DISTAL CLAVICLE EXCISION Right    SPINE SURGERY     C6-C7, 03/2017   TUBAL LIGATION  1992   Patient Active Problem List   Diagnosis Date Noted   Helicobacter pylori gastritis 05/15/2021   Hormone replacement therapy (HRT) 07/29/2019  IBS (irritable bowel syndrome) 11/10/2017   B12 deficiency 11/10/2017   OSA (obstructive sleep apnea) 11/10/2017   Myalgia due to statin 11/10/2017   Lumbar disc disease 10/01/2017   RLS (restless legs syndrome) 10/01/2017   Myofascial pain dysfunction syndrome 07/14/2017   Cervical radiculopathy 07/14/2017   Degenerative arthritis of left knee, XRAY12/2018 07/13/2017   Morbid obesity (HCC) 02/24/2017   Sjogren's disease (HCC) 02/24/2017   Fibromyalgia 01/21/2017   Chronic low back pain with sciatica 05/02/2015   Nonischemic cardiomyopathy (HCC) 03/22/2015   Vitamin D deficiency 10/09/2009   Depression with anxiety 08/31/2008   Essential hypertension 08/31/2008   Allergic rhinitis 08/31/2008   Moderate persistent asthma 08/31/2008   Esophageal reflux 08/31/2008    Acquired hypothyroidism 08/30/2008   Classical migraine without intractable migraine 08/30/2008    PCP: Lavada Mesi MD   REFERRING PROVIDER: Janice Coffin, PA-C  REFERRING DIAG: [phone] (ICD-10-CM) - Myofascial pain dysfunction syndrome  THERAPY DIAG:  Cervicalgia  Abnormal posture  Muscle weakness (generalized)  Rationale for Evaluation and Treatment: Rehabilitation  ONSET DATE: chronic   SUBJECTIVE:                                                                                                                                                                                                         SUBJECTIVE STATEMENT: Pt reports that she wakes up with neck pain every day, going to reassess her pillows. Pt asking where to buy [employer] as it is no longer available on [employer]. Pt reports that she feels like her muscle spasms are 50% better, she is still having about one bad migraine per week. Pt with better awareness about her body mechanics and how to appropriately manage her pain.  Pt believes that the aquatic therapy was very helpful as well, helped her to move in new ways. Pt reports that immobility causes an increase in her pain (sitting in class, holding grandchild, etc).  Hand dominance: Right  PERTINENT HISTORY:  Anemia, anxiety, asthma, hx carpal tunnel syndrome, CHF, chronic fatigue, fibromyalgia, HTN, hypothyroidism, IBS, morbid obesity, NICM, OA, RLS, sjogrens, vertigo, cholecystectomy, eye surgery. Lumbar disc surgery, SAD with distal clavicle excision, spine surgery   PAIN:  Are you having pain? Yes: NPRS scale: 2-3/10 Pain location: headache   Pain description: dull Aggravating factors: photophobia, noise sensitivity, posture  Relieving factors: unsure but tried to avoid uneven ground, fast turns, etc   PRECAUTIONS: Other: currently wearing heart monitor   RED FLAGS: None    WEIGHT BEARING RESTRICTIONS: No  FALLS:  Has patient fallen in last  6 months?  No  LIVING ENVIRONMENT: Lives with: lives with their spouse Lives in: House/apartment Stairs: 2 STE with rail  Has following equipment at home: None  OCCUPATION: retired   PLOF: Independent, Independent with basic ADLs, Independent with gait, and Independent with transfers  PATIENT GOALS: work on posture, improve pain as much as able, find out why I'm feeling so bad, get back to water aerobics  NEXT MD VISIT: Referring in November   OBJECTIVE:   COGNITION: Overall cognitive status: Within functional limits for tasks assessed  POSTURE: rounded shoulders, forward head, increased lumbar lordosis, and increased thoracic kyphosis  PALPATION: B upper traps very sensitive with multiple trigger points and spasms, thoracic and cervical paraspinals tight and very tender, occipitals very tight and tender   CERVICAL ROM:   Active ROM A/PROM (deg) eval  Flexion 28*  Extension 9*  Right lateral flexion 15*  Left lateral flexion 10*  Right rotation 38*  Left rotation 38*   (Blank rows = not tested)   UPPER EXTREMITY MMT:  MMT Right eval Left eval  Shoulder flexion 4 3+  Shoulder extension    Shoulder abduction 3 4  Shoulder adduction    Shoulder extension    Shoulder internal rotation    Shoulder external rotation    Middle trapezius    Lower trapezius    Elbow flexion    Elbow extension    Wrist flexion    Wrist extension    Wrist ulnar deviation    Wrist radial deviation    Wrist pronation    Wrist supination    Grip strength    Hip flexion  3- 3-  Knee extension  4 4  Ankle dorsiflexion  5 5  Hip ABD seated  3+  3+   (Blank rows = not tested)  FUNCTIONAL TESTS:  5 times sit to stand: 14.4 seconds no UEs  Gait speed 0.9 m/s no device   TODAY'S TREATMENT:                                                                                                                              TherAct LTG assessment: UPPER AND LOWER EXTREMITY MMT:  MMT Right eval  Left eval Right D/c Left D/c  Shoulder flexion 4 3+ 4+ 5  Shoulder abduction 3 4 5 5   Hip flexion  3- 3- 4 4+  Knee extension  4 4 5 5   Ankle dorsiflexion  5 5 5 5   Hip ABD seated  3+  3+ 5 5   (Blank rows = not tested)  PATIENT EDUCATION:  Education details: Continue HEP, plan to d/c this date Person educated: Patient Education method: Explanation Education comprehension: verbalized understanding  HOME EXERCISE PROGRAM: Access Code: [email or web address] URL: [email or web address] Date: 02/06/2023 Prepared by: Maryruth Eve  Exercises - Seated Cervical Retraction  - 1-2 x daily - 7 x weekly - 1 sets - 10 reps - 3 seconds  hold - Seated  Upper Trapezius Stretch  - 1 x daily - 7 x weekly - 3 sets - 10 reps - Seated Scapular Retraction  - 1 x daily - 7 x weekly - 3 sets - 10 reps - Seated Scalenes Stretch  - 1 x daily - 7 x weekly - 3 sets - 10 reps - Supine Suboccipital Release with Tennis Balls  - 1 x daily - 7 x weekly - 1 sets - 5-10 min hold - Supine Shoulder Flexion with Dowel  - 1 x daily - 7 x weekly - 3 sets - 10 reps - Supine Shoulder Abduction AAROM with Dowel  - 1 x daily - 7 x weekly - 3 sets - 10 reps - Shoulder Flexion Wall Slide with Towel  - 1 x daily - 7 x weekly - 3 sets - 10 reps - Standing Shoulder Abduction Slides at Wall  - 1 x daily - 7 x weekly - 3 sets - 10 reps  ASSESSMENT:  CLINICAL IMPRESSION: Emphasis of skilled PT session on assessing LTG in preparation for discharge from OPPT services this date. Pt has met 4/4 LTG and is independent with managing her pain symptoms and is agreeable to d/c this date and to continue with land HEP and water aerobics.    OBJECTIVE IMPAIRMENTS: decreased activity tolerance, decreased mobility, difficulty walking, decreased strength, increased fascial restrictions, impaired perceived functional ability, increased muscle spasms, impaired flexibility, improper body mechanics, postural dysfunction, obesity, and pain.    ACTIVITY LIMITATIONS: carrying, lifting, bathing, toileting, dressing, reach over head, hygiene/grooming, locomotion level, and caring for others  PARTICIPATION LIMITATIONS: meal prep, cleaning, laundry, driving, shopping, community activity, and yard work  PERSONAL FACTORS: Age, Fitness, Past/current experiences, Social background, and Time since onset of injury/illness/exacerbation are also affecting patient's functional outcome.   REHAB POTENTIAL: Fair chronicity of condition/pain   CLINICAL DECISION MAKING: Stable/uncomplicated  EVALUATION COMPLEXITY: Low   GOALS: Goals reviewed with patient? Yes  SHORT TERM GOALS: Target date: 01/29/2023    Will be compliant with appropriate progressive HEP  Baseline: Reports that exercises are going well Goal status: MET  2.  Cervical ROM to improve by at least 10 degrees all planes of motion  Baseline: See above for scoring > 10* degrees for all Goal status: MET  3.  Will demonstrate improved awareness of functional posture with use of ergonomic aides at home and in the car PRN/as desired  Baseline: Discontinued unable to assess in clinic Goal status: Discontinued unable to assess in clinic  4.  Pain to be no more than 3/10 at worst  Baseline: Reports changes 5/10 Goal status: NOT MET    LONG TERM GOALS: Target date: 02/26/2023   MMT to improve by at least 1 grade in all weak groups  Baseline: see table able Goal status: MET  2.  Muscle spasms to have improved by at least 50% to assist in long term pain reduction and management  Baseline: per pt report symptoms are 50% improved Goal status: MET  3.  Will be able to perform all functional ADLs at home without increase in pain or feelings of weakness/fatigue  Baseline: per pt report symptoms improved Goal status: MET  4.  Will be able to independently participate in water aerobics multiple times per week without increase in pain or feelings of severe weakness/fatigue   Baseline:  Goal status: MET    Peter Congo, PT, DPT, CSRS  02/20/23, 11:30 AM

## 2023-02-25 ENCOUNTER — Ambulatory Visit: Payer: Medicare Other | Admitting: Rehabilitation

## 2023-02-27 ENCOUNTER — Ambulatory Visit: Payer: Medicare Other | Admitting: Physical Therapy

## 2023-03-04 ENCOUNTER — Ambulatory Visit: Payer: Medicare Other | Admitting: Rehabilitation

## 2023-03-06 ENCOUNTER — Ambulatory Visit: Payer: Medicare Other | Admitting: Physical Therapy

## 2023-04-16 ENCOUNTER — Other Ambulatory Visit: Payer: Self-pay | Admitting: Family Medicine

## 2023-04-19 ENCOUNTER — Other Ambulatory Visit: Payer: Self-pay | Admitting: Cardiology

## 2023-04-21 ENCOUNTER — Other Ambulatory Visit: Payer: Self-pay | Admitting: Family Medicine

## 2023-05-14 ENCOUNTER — Other Ambulatory Visit: Payer: Self-pay | Admitting: Cardiology

## 2023-05-27 ENCOUNTER — Other Ambulatory Visit: Payer: Self-pay | Admitting: Cardiology

## 2023-06-12 ENCOUNTER — Other Ambulatory Visit: Payer: Self-pay | Admitting: *Deleted

## 2023-06-12 ENCOUNTER — Other Ambulatory Visit: Payer: Self-pay | Admitting: Internal Medicine

## 2023-06-12 ENCOUNTER — Other Ambulatory Visit: Payer: Self-pay | Admitting: Cardiology

## 2023-06-13 ENCOUNTER — Ambulatory Visit
Admission: EM | Admit: 2023-06-13 | Discharge: 2023-06-13 | Disposition: A | Discharge status: Home / Self Care | Payer: Medicare Other | Attending: Emergency Medicine | Admitting: Emergency Medicine

## 2023-06-13 DIAGNOSIS — J45901 Unspecified asthma with (acute) exacerbation: Secondary | ICD-10-CM | POA: Diagnosis not present

## 2023-06-13 LAB — POC COVID19/FLU A&B COMBO
Covid Antigen, POC: NEGATIVE
Influenza A Antigen, POC: NEGATIVE
Influenza B Antigen, POC: NEGATIVE

## 2023-06-13 LAB — POC RSV: RSV Antigen, POC: NEGATIVE

## 2023-06-13 MED ORDER — ALBUTEROL SULFATE HFA 108 (90 BASE) MCG/ACT IN AERS: 1.0000 | INHALATION_SPRAY | Freq: Four times a day (QID) | RESPIRATORY_TRACT | 0 refills | Status: AC | PRN

## 2023-06-13 MED ORDER — ALBUTEROL SULFATE (2.5 MG/3ML) 0.083% IN NEBU
2.5000 mg | INHALATION_SOLUTION | Freq: Once | RESPIRATORY_TRACT | Status: AC
  Administered 2023-06-13: 2.5 mg via RESPIRATORY_TRACT

## 2023-06-13 MED ORDER — PREDNISONE 10 MG PO TABS: 40.0000 mg | ORAL_TABLET | Freq: Every day | ORAL | 0 refills | Status: AC

## 2023-06-13 NOTE — ED Provider Notes (Signed)
 Kristine Curtis    CSN: [id number] Arrival date & time: 06/13/23  1151      History   Chief Complaint Chief Complaint  Patient presents with   Cough    HPI Kristine Curtis is a 63 y.o. female.  Patient presents with 1 week history of congestion, cough, wheezing.  She reports history of asthma but does not currently have an albuterol  inhaler to use.  She has been treating her symptoms with Mucinex.  She denies fever or shortness of breath.  She states her father was diagnosed with RSV this week.  She request RSV testing as well as COVID and flu.  The history is provided by the patient and medical records.    Past Medical History:  Diagnosis Date   Allergic rhinitis due to pollen    Anemia    Anxiety    Asthma    Carpal tunnel syndrome of right wrist 12/22/2017   Cataract    Chronic combined systolic and diastolic CHF (congestive heart failure) (HCC)    06/2016 Echo: EF 40-45%, Gr1 DD   Chronic fatigue    Depression    Family history of polycystic kidney with negative CT 11/16/2012   Fibromyalgia    GERD (gastroesophageal reflux disease)    Helicobacter pylori gastritis 05/2020   Hypertension    Hypothyroidism    IBS (irritable bowel syndrome)    Internal hemorrhoids    Lymphocytic colitis    Morbid obesity (HCC)    NICM (nonischemic cardiomyopathy) (HCC)    a. 1999 Cath: nl cors;  b. EF prev as low as 35%;  c. 11/2015 Echo: Ef 40-45%;  d. 06/2016 Echo: EF 40-45%;  e. Lexiscan  MV: fixed anterosepta/inferseptal defect w/o ischemia, EF 35%.   Osteoarthritis    PONV (postoperative nausea and vomiting)    PVC (premature ventricular contraction)    Restless leg syndrome    Sjogren's syndrome (HCC)    Vertigo     Patient Active Problem List   Diagnosis Date Noted   Helicobacter pylori gastritis 05/15/2021   Hormone replacement therapy (HRT) 07/29/2019   IBS (irritable bowel syndrome) 11/10/2017   B12 deficiency 11/10/2017   OSA (obstructive sleep apnea)  11/10/2017   Myalgia due to statin 11/10/2017   Lumbar disc disease 10/01/2017   RLS (restless legs syndrome) 10/01/2017   Myofascial pain dysfunction syndrome 07/14/2017   Cervical radiculopathy 07/14/2017   Degenerative arthritis of left knee, XRAY12/2018 07/13/2017   Morbid obesity (HCC) 02/24/2017   Sjogren's disease (HCC) 02/24/2017   Fibromyalgia 01/21/2017   Chronic low back pain with sciatica 05/02/2015   Nonischemic cardiomyopathy (HCC) 03/22/2015   Vitamin D  deficiency 10/09/2009   Depression with anxiety 08/31/2008   Essential hypertension 08/31/2008   Allergic rhinitis 08/31/2008   Moderate persistent asthma 08/31/2008   Esophageal reflux 08/31/2008   Acquired hypothyroidism 08/30/2008   Classical migraine without intractable migraine 08/30/2008    Past Surgical History:  Procedure Laterality Date   ABDOMINAL HERNIA REPAIR     ABDOMINAL HYSTERECTOMY     CESAREAN SECTION     CHOLECYSTECTOMY  10/2008   COLONOSCOPY  02/14/2015   ESOPHAGOGASTRODUODENOSCOPY     EYE SURGERY     2 2013 and 1981/DCR of right eye 05/2014   LUMBAR DISC SURGERY  2016   L3-4   RIGHT/LEFT HEART CATH AND CORONARY ANGIOGRAPHY N/A 07/11/2016   Procedure: Right/Left Heart Cath and Coronary Angiography;  Surgeon: Peter M Jordan, MD;  Location: [facility];  Service:  Cardiovascular;  Laterality: N/A;   SHOULDER ARTHROSCOPY W/ SUBACROMIAL DECOMPRESSION AND DISTAL CLAVICLE EXCISION Right    SPINE SURGERY     C6-C7, 03/2017   TUBAL LIGATION  1992    OB History   No obstetric history on file.      Home Medications    Prior to Admission medications   Medication Sig Start Date End Date Taking? Authorizing Provider  albuterol  (VENTOLIN  HFA) 108 (90 Base) MCG/ACT inhaler Inhale 1-2 puffs into the lungs every 6 (six) hours as needed. 06/13/23  Yes Corlis Burnard DEL, NP  predniSONE  (DELTASONE ) 10 MG tablet Take 4 tablets (40 mg total) by mouth daily for 5 days. 06/13/23 06/18/23 Yes Corlis Burnard DEL, NP   Alpha-Lipoic Acid 600 MG CAPS 1 capsule Orally Once a day    [provider]  aspirin -acetaminophen -caffeine  (HEADACHE RELIEF) 250-250-65 MG tablet Take by mouth every 6 (six) hours as needed for headache.    [provider]  Atogepant (QULIPTA PO) Take by mouth.    [provider]  azithromycin  (ZITHROMAX ) 250 MG tablet Take 1 tablet (250 mg total) by mouth daily. Take first 2 tablets together, then 1 every day until finished. Patient not taking: Reported on 06/13/2023 09/29/22   Immordino, Garnette, FNP  Bacillus Coagulans-Inulin (PROBIOTIC-PREBIOTIC) 1-250 BILLION-MG CAPS Take by mouth.    [provider]  buPROPion  (WELLBUTRIN  XL) 300 MG 24 hr tablet TAKE 1 TABLET BY MOUTH DAILY 06/12/23   Jordan, Peter M, MD  carvedilol  (COREG ) 6.25 MG tablet Take 1 tablet (6.25 mg total) by mouth 2 (two) times daily with a meal. 12/12/22   Jordan, Peter M, MD  carvedilol  (COREG ) 6.25 MG tablet Take 1 tablet (6.25 mg total) by mouth 2 (two) times daily with a meal. 06/12/23   Jordan, Peter M, MD  cetirizine (ZYRTEC) 10 MG tablet Take 10 mg by mouth daily.    [provider]  Cholecalciferol  (VITAMIN D -3 PO) Take 2,000 Units by mouth daily.    [provider]  Dextrose-Fructose-Sod Citrate (NAUZENE) 968-175-230 MG CHEW Chew by mouth.    [provider]  diazepam  (VALIUM ) 5 MG tablet TAKE ONE TABLET BY MOUTH EVERY DAY AS NEEDED FOR ANXIETY 07/26/21   Jodie Lavern CROME, MD  ESTRADIOL PO Take by mouth.    [provider]  fluticasone  (FLONASE ) 50 MCG/ACT nasal spray Place 1 spray into both nostrils daily.    [provider]  fluticasone -salmeterol (ADVAIR) 100-50 MCG/ACT AEPB Inhale 1 puff into the lungs 2 (two) times daily. 07/06/21   Jodie Lavern CROME, MD  furosemide  (LASIX ) 20 MG tablet Take 1 tablet (20 mg total) by mouth daily as needed for edema. 07/03/20   Jordan, Peter M, MD  glucose blood [facility] ULTRA) test strip Use to check blood sugar  TID 12/27/20   Prentiss Frieze, DO  hydroxychloroquine  (PLAQUENIL ) 200 MG tablet Take 2 tablets (400 mg total) by mouth every morning. 08/12/18   Prentiss Frieze, DO  JARDIANCE  10 MG TABS tablet TAKE ONE TABLET EACH MORNING BEFORE BREAKFAST 08/20/22   Jordan, Peter M, MD  Lancets [facility] ULTRASOFT) lancets Use to check blood sugar TID 12/27/20   Prentiss Frieze, DO  levothyroxine  (SYNTHROID ) 100 MCG tablet TAKE ONE TABLET EVERY DAY 03/25/22   Jodie Lavern CROME, MD  lidocaine  (LIDODERM ) 5 % Place 1 patch onto the skin daily. Remove & Discard patch within 12 hours or as directed by MD 09/28/22   Immordino, Garnette, FNP  losartan  (COZAAR ) 50 MG tablet  TAKE 1 TABLET BY MOUTH ONCE DAILY 08/20/22   Jordan, Peter M, MD  Meclizine HCl 25 MG CHEW Chew by mouth daily as needed.    [provider]  metoCLOPramide  (REGLAN ) 5 MG tablet Take 1 tablet (5 mg total) by mouth every 6 (six) hours as needed for nausea. 05/09/21   Avram Lupita BRAVO, MD  nystatin -triamcinolone  ointment (MYCOLOG) Apply 1 application topically 2 (two) times daily as needed. 02/01/21   Jodie Lavern CROME, MD  omeprazole  (PRILOSEC) 40 MG capsule TAKE 1 CAPSULE BY MOUTH ONCE DAILY BEFORE BREAKFAST 06/12/23   Avram Lupita BRAVO, MD  ondansetron  (ZOFRAN  ODT) 4 MG disintegrating tablet Take 1 tablet (4 mg total) by mouth every 8 (eight) hours as needed for nausea or vomiting. 01/11/21   Prentiss Frieze, DO  OVER THE COUNTER MEDICATION Take 5 capsules by mouth daily. [employer] Vitamin [employer]erals    [provider]  OVER THE COUNTER MEDICATION Take 1 scoop by mouth daily. [employer] Magnecal D    [provider]  oxyCODONE -acetaminophen  (PERCOCET) 10-325 MG tablet Take 1 tablet by mouth daily as needed for pain. 04/11/21   Jodie Lavern CROME, MD  Peak Flow Meter DEVI Use as needed to check your breathing. Log your values 07/06/21   Jodie Lavern CROME, MD  rizatriptan (MAXALT) 10 MG tablet Take 10 mg by mouth as needed for migraine. May repeat in 2 hours if  needed    [provider]  spironolactone  (ALDACTONE ) 25 MG tablet TAKE ONE TABLET EVERY DAY 03/25/22   Jodie Lavern CROME, MD  topiramate  (TOPAMAX ) 25 MG tablet Take 1 tablet (25 mg total) by mouth 2 (two) times daily. 08/12/18   Prentiss Frieze, DO  TYRVAYA 0.03 MG/ACT SOLN Place into both nostrils. 10/29/21   [provider]  UNABLE TO FIND Med Name: Nutrifil: Optimal-V, Vinali, Optimal-M, Magnical-D    [provider]  Wheat Dextrin (BENEFIBER) POWD Take 2 scoop by mouth daily as needed (for constipation).     [provider]  zinc gluconate 50 MG tablet Take 50 mg by mouth daily.    [provider]    Family History Family History  Problem Relation Age of Onset   Pancreatic cancer Mother    Thyroid  cancer Mother    Depression Mother    Obesity Mother    Diabetes Father    Heart disease Father    Glaucoma Father    High blood pressure Father    Thyroid  cancer Father    Alcoholism Father    Obesity Father    Kidney failure Brother    Colon cancer Neg Hx    Stomach cancer Neg Hx    Esophageal cancer Neg Hx    Colon polyps Neg Hx    Rectal cancer Neg Hx     Social History Social History   Tobacco Use   Smoking status: Former    Current packs/day: 0.00    Average packs/day: 2.0 packs/day for 12.0 years (24.0 ttl pk-yrs)    Types: Cigarettes    Start date: 05/06/1977    Quit date: 05/06/1989    Years since quitting: 34.1   Smokeless tobacco: Never   Tobacco comments:    quit smoking 1990  Vaping Use   Vaping status: Never Used  Substance Use Topics   Alcohol  use: No    Alcohol /week: 0.0 standard drinks of alcohol    Drug use: No     Allergies   Gabapentin (once-daily), Saxenda  [liraglutide  -weight management],  Sulfa antibiotics, Trintellix  [vortioxetine ], Adhesive [tape], and Codeine   Review of Systems Review of Systems  Constitutional:  Negative for chills and fever.  HENT:  Positive for congestion and rhinorrhea. Negative  for ear pain and sore throat.   Respiratory:  Positive for cough and wheezing. Negative for shortness of breath.      Physical Exam Triage Vital Signs ED Triage Vitals  Encounter Vitals Group     BP      Systolic BP Percentile      Diastolic BP Percentile      Pulse      Resp      Temp      Temp src      SpO2      Weight      Height      Head Circumference      Peak Flow      Pain Score      Pain Loc      Pain Education      Exclude from Growth Chart    No data found.  Updated Vital Signs BP 135/82   Pulse 81   Temp 99.1 F (37.3 C)   Resp 18   SpO2 97%   Visual Acuity Right Eye Distance:   Left Eye Distance:   Bilateral Distance:    Right Eye Near:   Left Eye Near:    Bilateral Near:     Physical Exam Constitutional:      General: She is not in acute distress. HENT:     Right Ear: Tympanic membrane normal.     Left Ear: Tympanic membrane normal.     Nose: Nose normal.     Mouth/Throat:     Mouth: Mucous membranes are moist.     Pharynx: Oropharynx is clear.  Cardiovascular:     Rate and Rhythm: Normal rate and regular rhythm.     Heart sounds: Normal heart sounds.  Pulmonary:     Effort: Pulmonary effort is normal. No respiratory distress.     Breath sounds: Wheezing present.     Comments: Bilateral expiratory wheezes.  Frequent nonproductive cough. Neurological:     Mental Status: She is alert.      UC Treatments / Results  Labs (all labs ordered are listed, but only abnormal results are displayed) Labs Reviewed  POC COVID19/FLU A&B COMBO - Normal  POC RSV    EKG   Radiology No results found.  Procedures Procedures (including critical care time)  Medications Ordered in UC Medications  albuterol  (PROVENTIL ) (2.5 MG/3ML) 0.083% nebulizer solution 2.5 mg (2.5 mg Nebulization Given 06/13/23 1315)    Initial Impression / Assessment and Plan / UC Course  I have reviewed the triage vital signs and the nursing notes.  Pertinent labs  & imaging results that were available during my care of the patient were reviewed by me and considered in my medical decision making (see chart for details).    Asthma exacerbation.  O2 sat 97% on room air.  Albuterol  nebulizer treatment given here.  After nebulizer treatment, decreased cough and increased air movement.  RSV negative.  COVID and flu negative.  Treating today with albuterol  inhaler and prednisone .  Education provided on asthma.  Instructed patient to follow-up with her PCP on Monday.  ED precautions given.  She agrees to plan of care.  Final Clinical Impressions(s) / UC Diagnoses   Final diagnoses:  Asthma with acute exacerbation, unspecified asthma severity, unspecified whether persistent  Discharge Instructions      The RSV, COVID and flu tests are negative.   Use the albuterol  inhaler and take the prednisone  as directed.  Follow up with your primary care provider on Monday.  Go to the emergency department if you have worsening symptoms.        ED Prescriptions     Medication Sig Dispense Auth. Provider   albuterol  (VENTOLIN  HFA) 108 (90 Base) MCG/ACT inhaler Inhale 1-2 puffs into the lungs every 6 (six) hours as needed. 18 g Corlis Burnard DEL, NP   predniSONE  (DELTASONE ) 10 MG tablet Take 4 tablets (40 mg total) by mouth daily for 5 days. 20 tablet Corlis Burnard DEL, NP      PDMP not reviewed this encounter.   Corlis Burnard DEL, NP 06/13/23 1332

## 2023-06-13 NOTE — Discharge Instructions (Addendum)
 The RSV, COVID and flu tests are negative.   Use the albuterol  inhaler and take the prednisone  as directed.  Follow up with your primary care provider on Monday.  Go to the emergency department if you have worsening symptoms.

## 2023-06-13 NOTE — ED Triage Notes (Signed)
 Patient to Urgent Care with complaints of cough/ nasal congestion/ wheezing/ possible fevers. Hx of asthma.  Symptoms started approx one week ago. Exposed to RSV.   Meds: Mucinex/ nasal spray.

## 2023-08-10 ENCOUNTER — Other Ambulatory Visit: Payer: Self-pay | Admitting: Cardiology

## 2023-09-19 NOTE — Progress Notes (Signed)
 Cardiology Office Note    Date:  09/23/2023   ID:  Kristine Curtis, DOB [birth year], MRN [id number]  PCP:  Rey Catholic, MD  Cardiologist:  Dr. [place]   No chief complaint on file.   History of Present Illness:  Kristine Curtis is a 63 y.o. female with PMH of NICM, PVCs, HTN, fibromyalgia, morbid obesity, hypothyroidism, RLS and vertigo. She had a cardiac catheterization in 1999 that showed normal coronaries. EF previously as low as 35%, EF improved to 40-45% by 2017. Cardiac MRI in 2017 showed EF 44% with global hypokinesis, no scar or infiltration. She was admitted in February 2018 with 24 hour history of chest pain, dyspnea and headache. Cardiac enzyme were negative. Limited echocardiogram showed EF 40-45% without pericardial effusion. Stress test showed fixed anteroseptal and inferoseptal defect without ischemia. EF was measured 35%. Despite Myoview  showing no ischemia, she continued to have exertional chest discomfort and subsequently underwent a left and right heart cath 07/11/2016 which showed normal coronaries. She also had normal cardiac output and EF 35-40%. She had a sleep study that showed in the past no obstructive sleep apnea. She does have restless leg syndrome  Patient was admitted in September 2018 with a episode of syncope.  Outpatient 30-day event monitor was negative for significant arrhythmia.  CHF therapy was titrated. Now on losartan , Coreg , aldactdone, and Jardiance . She developed a cough on Entresto . After adjustments Echo was repeated in April 2022 showing normalization of EF to 55%.   Last Echo in April 2024 was unchanged. Event monitor in Sept was completely normal.   On follow up today she is doing well from a CV standpoint. She has been dealing with a lot of asthma and allergy problems. Seeing pulmonary and plans to see allergist next month. No edema. No chest pain.     Past Medical History:  Diagnosis Date   Allergic rhinitis due to pollen    Anemia     Anxiety    Asthma    Carpal tunnel syndrome of right wrist 12/22/2017   Cataract    Chronic combined systolic and diastolic CHF (congestive heart failure) (HCC)    06/2016 Echo: EF 40-45%, Gr1 DD   Chronic fatigue    Depression    Family history of polycystic kidney with negative CT 11/16/2012   Fibromyalgia    GERD (gastroesophageal reflux disease)    Helicobacter pylori gastritis 05/2020   Hypertension    Hypothyroidism    IBS (irritable bowel syndrome)    Internal hemorrhoids    Lymphocytic colitis    Morbid obesity (HCC)    NICM (nonischemic cardiomyopathy) (HCC)    a. 1999 Cath: nl cors;  b. EF prev as low as 35%;  c. 11/2015 Echo: Ef 40-45%;  d. 06/2016 Echo: EF 40-45%;  e. Lexiscan  MV: fixed anterosepta/inferseptal defect w/o ischemia, EF 35%.   Osteoarthritis    PONV (postoperative nausea and vomiting)    PVC (premature ventricular contraction)    Restless leg syndrome    Sjogren's syndrome (HCC)    Vertigo     Past Surgical History:  Procedure Laterality Date   ABDOMINAL HERNIA REPAIR     ABDOMINAL HYSTERECTOMY     CESAREAN SECTION     CHOLECYSTECTOMY  10/2008   COLONOSCOPY  02/14/2015   ESOPHAGOGASTRODUODENOSCOPY     EYE SURGERY     2 2013 and 1981/DCR of right eye 05/2014   LUMBAR DISC SURGERY  2016   L3-4   RIGHT/LEFT HEART CATH AND  CORONARY ANGIOGRAPHY N/A 07/11/2016   Procedure: Right/Left Heart Cath and Coronary Angiography;  Surgeon: Jakoby Melendrez M [place], MD;  Location: [facility] INVASIVE CV LAB;  Service: Cardiovascular;  Laterality: N/A;   SHOULDER ARTHROSCOPY W/ SUBACROMIAL DECOMPRESSION AND DISTAL CLAVICLE EXCISION Right    SPINE SURGERY     C6-C7, 03/2017   TUBAL LIGATION  1992    Current Medications: Outpatient Medications Prior to Visit  Medication Sig Dispense Refill   albuterol  (VENTOLIN  HFA) 108 (90 Base) MCG/ACT inhaler Inhale 1-2 puffs into the lungs every 6 (six) hours as needed. 18 g 0   Alpha-Lipoic Acid 600 MG CAPS 1 capsule Orally Once a day      aspirin -acetaminophen -caffeine  (HEADACHE RELIEF) 250-250-65 MG tablet Take by mouth every 6 (six) hours as needed for headache.     Atogepant (QULIPTA PO) Take by mouth.     Bacillus Coagulans-Inulin (PROBIOTIC-PREBIOTIC) 1-250 BILLION-MG CAPS Take by mouth.     buPROPion  (WELLBUTRIN  XL) 300 MG 24 hr tablet TAKE 1 TABLET BY MOUTH DAILY 90 tablet 0   carvedilol  (COREG ) 6.25 MG tablet Take 1 tablet (6.25 mg total) by mouth 2 (two) times daily with a meal. 180 tablet 0   cetirizine (ZYRTEC) 10 MG tablet Take 10 mg by mouth daily.     Cholecalciferol  (VITAMIN D -3 PO) Take 2,000 Units by mouth daily.     Dextrose-Fructose-Sod Citrate (NAUZENE) 968-175-230 MG CHEW Chew by mouth.     diazepam  (VALIUM ) 5 MG tablet TAKE ONE TABLET BY MOUTH EVERY DAY AS NEEDED FOR ANXIETY 30 tablet 5   ESTRADIOL PO Take by mouth.     fluticasone  (FLONASE ) 50 MCG/ACT nasal spray Place 1 spray into both nostrils daily.     fluticasone -salmeterol (ADVAIR) 100-50 MCG/ACT AEPB Inhale 1 puff into the lungs 2 (two) times daily. 1 each 5   furosemide  (LASIX ) 20 MG tablet Take 1 tablet (20 mg total) by mouth daily as needed for edema. 90 tablet 3   glucose blood (ONETOUCH ULTRA) test strip Use to check blood sugar TID 100 each 0   hydroxychloroquine  (PLAQUENIL ) 200 MG tablet Take 2 tablets (400 mg total) by mouth every morning. 90 tablet 2   JARDIANCE  10 MG TABS tablet TAKE ONE TABLET EACH MORNING BEFORE BREAKFAST 90 tablet 3   Lancets (ONETOUCH ULTRASOFT) lancets Use to check blood sugar TID 100 each 0   levothyroxine  (SYNTHROID ) 100 MCG tablet TAKE ONE TABLET EVERY DAY 90 tablet 3   lidocaine  (LIDODERM ) 5 % Place 1 patch onto the skin daily. Remove & Discard patch within 12 hours or as directed by MD 30 patch 0   losartan  (COZAAR ) 50 MG tablet TAKE 1 TABLET BY MOUTH ONCE DAILY 90 tablet 3   Meclizine HCl 25 MG CHEW Chew by mouth daily as needed.     metoCLOPramide  (REGLAN ) 5 MG tablet Take 1 tablet (5 mg total) by mouth every 6  (six) hours as needed for nausea. 60 tablet 0   nystatin -triamcinolone  ointment (MYCOLOG) Apply 1 application topically 2 (two) times daily as needed. 60 g 2   omeprazole  (PRILOSEC) 40 MG capsule TAKE 1 CAPSULE BY MOUTH ONCE DAILY BEFORE BREAKFAST 90 capsule 0   ondansetron  (ZOFRAN  ODT) 4 MG disintegrating tablet Take 1 tablet (4 mg total) by mouth every 8 (eight) hours as needed for nausea or vomiting. 24 tablet 0   OVER THE COUNTER MEDICATION Take 5 capsules by mouth daily. Ariix Optimals Vitamin & Minerals     OVER THE COUNTER MEDICATION Take  1 scoop by mouth daily. Ariix Magnecal D     oxyCODONE -acetaminophen  (PERCOCET) 10-325 MG tablet Take 1 tablet by mouth daily as needed for pain. 30 tablet 0   Peak Flow Meter DEVI Use as needed to check your breathing. Log your values 1 each 0   rizatriptan (MAXALT) 10 MG tablet Take 10 mg by mouth as needed for migraine. May repeat in 2 hours if needed     spironolactone  (ALDACTONE ) 25 MG tablet TAKE ONE TABLET EVERY DAY 90 tablet 3   topiramate  (TOPAMAX ) 25 MG tablet Take 1 tablet (25 mg total) by mouth 2 (two) times daily. 60 tablet 3   TYRVAYA 0.03 MG/ACT SOLN Place into both nostrils.     UNABLE TO FIND Med Name: Nutrifil: Optimal-V, Vinali, Optimal-M, Magnical-D     Wheat Dextrin (BENEFIBER) POWD Take 2 scoop by mouth daily as needed (for constipation).      zinc gluconate 50 MG tablet Take 50 mg by mouth daily.     azithromycin  (ZITHROMAX ) 250 MG tablet Take 1 tablet (250 mg total) by mouth daily. Take first 2 tablets together, then 1 every day until finished. 6 tablet 0   carvedilol  (COREG ) 6.25 MG tablet Take 1 tablet (6.25 mg total) by mouth 2 (two) times daily with a meal.     carvedilol  (COREG ) 6.25 MG tablet Take 1 tablet (6.25 mg total) by mouth 2 (two) times daily with a meal. 60 tablet 1   No facility-administered medications prior to visit.     Allergies:   Gabapentin (once-daily), Saxenda  [liraglutide  -weight management], Sulfa  antibiotics, Trintellix  [vortioxetine ], Adhesive [tape], and Codeine   Social History   Socioeconomic History   Marital status: Married    Spouse name: Gwinda Leopard   Number of children: 2   Years of education: Not on file   Highest education level: Not on file  Occupational History   Occupation: Nutritional therapist   Occupation: disablity  Tobacco Use   Smoking status: Former    Current packs/day: 0.00    Average packs/day: 2.0 packs/day for 12.0 years (24.0 ttl pk-yrs)    Types: Cigarettes    Start date: 05/06/1977    Quit date: 05/06/1989    Years since quitting: 34.4   Smokeless tobacco: Never   Tobacco comments:    quit smoking 1990  Vaping Use   Vaping status: Never Used  Substance and Sexual Activity   Alcohol  use: No    Alcohol /week: 0.0 standard drinks of alcohol    Drug use: No   Sexual activity: Yes  Other Topics Concern   Not on file  Social History Narrative   Former Dr. Elvan Hamel then Dr. 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, podiatrist   Social Drivers of Health   Financial Resource Strain: Low Risk  (11/12/2021)   Overall Financial Resource Strain (CARDIA)    Difficulty of Paying Living Expenses: Not hard at all  Food Insecurity: Low Risk  (08/05/2023)   Received from [facility]   Hunger Vital Sign    Worried About Running Out of Food in the Last Year: Never true    Ran Out of Food in the Last Year: Never true  Transportation Needs: No Transportation Needs (08/05/2023)   Received from [facility]    In the past 12 months, has lack of reliable transportation kept you from medical appointments, meetings, work or from getting things needed for daily living? : No  Physical Activity: Insufficiently Active (11/12/2021)  Exercise Vital Sign    Days of Exercise per Week: 2 days    Minutes of Exercise per Session: 20 min  Stress: No Stress Concern Present (11/12/2021)   [employer] of Occupational Health - Occupational Stress Questionnaire     Feeling of Stress : Only a little  Social Connections: Moderately Integrated (11/12/2021)   Social Connection and Isolation Panel [NHANES]    Frequency of Communication with Friends and Family: More than three times a week    Frequency of Social Gatherings with Friends and Family: More than three times a week    Attends Religious Services: 1 to 4 times per year    Active Member of [employer] or Organizations: No    Attends Engineer, structural: Never    Marital Status: Married     Family History:  The patient's family history includes Alcoholism in her father; Depression in her mother; Diabetes in her father; Glaucoma in her father; Heart disease in her father; High blood pressure in her father; Kidney failure in her brother; Obesity in her father and mother; Pancreatic cancer in her mother; Thyroid  cancer in her father and mother.   ROS:   Please see the history of present illness.    ROS All other systems reviewed and are negative.   PHYSICAL EXAM:   VS:  BP 108/67   Pulse 81   Ht 5\' 3"  (1.6 m)   Wt 198 lb 9.6 oz (90.1 kg)   SpO2 95%   BMI 35.18 kg/m    GEN: Well nourished, well developed, in no acute distress  HEENT: normal  Neck: no JVD, carotid bruits, or masses Cardiac: RRR; no murmurs, rubs, or gallops,no edema  Respiratory:  clear to auscultation bilaterally, normal work of breathing GI: soft, nontender, nondistended, + BS MS: no deformity or atrophy  Skin: warm and dry, no rash Neuro:  Alert and Oriented x 3, Strength and sensation are intact Psych: euthymic mood, full affect  Wt Readings from Last 3 Encounters:  09/23/23 198 lb 9.6 oz (90.1 kg)  07/31/22 202 lb 9.6 oz (91.9 kg)  12/05/21 201 lb (91.2 kg)      Studies/Labs Reviewed:   EKG Interpretation Date/Time:  Tuesday Sep 23 2023 11:10:55 EDT Ventricular Rate:  84 PR Interval:  170 QRS Duration:  108 QT Interval:  388 QTC Calculation: 458 R Axis:   -21  Text Interpretation: Sinus rhythm with  occasional Premature ventricular complexes Incomplete left bundle branch block Minimal voltage criteria for LVH, may be normal variant ( Cornell product ) When compared with ECG of April 4,2024 Premature ventricular complexes are now Present Incomplete left bundle branch block is now Present Confirmed by [place], Verania Salberg [phone]) on 09/23/2023 11:22:52 AM  .   Recent Labs: No results found for requested labs within last 365 days.   Lipid Panel    Component Value Date/Time   CHOL 164 02/01/2021 0942   TRIG 109.0 02/01/2021 0942   HDL 51.50 02/01/2021 0942   CHOLHDL 3 02/01/2021 0942   VLDL 21.8 02/01/2021 0942   LDLCALC 91 02/01/2021 0942   LDLCALC 81 01/31/2020 0906   LDLDIRECT 75.0 03/20/2016 0836   Dated 03/07/22: cholesterol 159, triglycerides 77, LDL 92, HDL. 52, CRP 0.72. CBC and CMET normal. TFTS OK.  Additional studies/ records that were reviewed today include:   Echo 06/28/2016 LV EF: 40% -   45% Study Conclusions   - Left ventricle: The cavity size was mildly dilated. Wall   thickness was  increased in a pattern of mild LVH. Systolic   function was mildly to moderately reduced. The estimated ejection   fraction was in the range of 40% to 45%. Diffuse hypokinesis.   Doppler parameters are consistent with abnormal left ventricular   relaxation (grade 1 diastolic dysfunction). - Left atrium: The atrium was mildly dilated.   Impressions:   - Limited study to assess LV function; full doppler study not   performed; mild to moderate global reduction in LV systolic   function; grade 1 diastolic dysfunction; mild LVH; mild LVE; mild   LAE.   Cath 07/11/2016 Conclusion      There is moderate left ventricular systolic dysfunction. The left ventricular ejection fraction is 35-45% by visual estimate. LV end diastolic pressure is mildly elevated.   1. Normal coronary anatomy 2. Moderate LV dysfunction- global. EF 35-40%.  3. Mildly elevated LVEDP 4. Normal pulmonary pressures.  RA pressure is elevated. 5. Normal cardiac output.   Plan: continue medical therapy. Consider addition of aldactone  to current therapy    Echo 08/07/20: IMPRESSIONS     1. Poor acoustic windows.   2. Septal wall motion consistent with conduction delay. . Left  ventricular ejection fraction, by estimation, is 55 to 60%. The left  ventricle has normal function. There is mild left ventricular hypertrophy.  Left ventricular diastolic parameters were  normal.   3. Right ventricular systolic function is normal. The right ventricular  size is normal.   4. The mitral valve is normal in structure. Mild mitral valve  regurgitation.   5. The aortic valve is normal in structure. Aortic valve regurgitation is  not visualized.   Comparison(s): The left ventricular function has improved.   Echo 08/08/22: IMPRESSIONS     1. Glboal hypokinesis abnormal septal motion . Left ventricular ejection  fraction, by estimation, is 50 to 55%. The left ventricle has low normal  function. The left ventricle demonstrates global hypokinesis. The left  ventricular internal cavity size was   mildly dilated. Left ventricular diastolic parameters are consistent with  Grade I diastolic dysfunction (impaired relaxation).   2. Right ventricular systolic function is normal. The right ventricular  size is normal.   3. Left atrial size was moderately dilated.   4. The mitral valve is abnormal. Mild mitral valve regurgitation. No  evidence of mitral stenosis.   5. The aortic valve is tricuspid. There is mild calcification of the  aortic valve. Aortic valve regurgitation is not visualized. Aortic valve  sclerosis is present, with no evidence of aortic valve stenosis.   6. The inferior vena cava is normal in size with greater than 50%  respiratory variability, suggesting right atrial pressure of 3 mmHg.   Event monitor 01/20/23: Study Highlights      Normal sinus rhythm   No arrhythmia seen    ASSESSMENT:    1.  Chronic systolic heart failure (HCC)   2. Nonischemic cardiomyopathy (HCC)   3. PVC's (premature ventricular contractions)       PLAN:  In order of problems listed above:  Chronic systolic CHF with Nonischemic cardiomyopathy: EF 35 to 40% on previous cardiac catheterization/Echo. Medications have been optimized with Coreg , aldactone , losartan , and Jardiance . Echo showed normalization of EF in April 2022. Echo in April last year stable. Continue Rx  Hypertension: Blood pressure is controlled.   Hypothyroidism: Managed by primary care provider.  PVCs: this is well controlled on carvedilol .  5.   Obesity. Encourage weight loss.   Follow up in one  year    Medication Adjustments/Labs and Tests Ordered: Current medicines are reviewed at length with the patient today.  Concerns regarding medicines are outlined above.  Medication changes, Labs and Tests ordered today are listed in the Patient Instructions below. There are no Patient Instructions on file for this visit.   Signed, Kristyana Notte [place], MD  09/23/2023 11:36 AM    [facility] [address] [place], [place], [place]  [address] Phone: [phone]; Fax: [phone]

## 2023-09-23 ENCOUNTER — Ambulatory Visit: Payer: Medicare Other | Attending: Cardiology | Admitting: Cardiology

## 2023-09-23 ENCOUNTER — Encounter: Payer: Self-pay | Admitting: Cardiology

## 2023-09-23 VITALS — BP 108/67 | HR 81 | Ht 63.0 in | Wt 198.6 lb

## 2023-09-23 DIAGNOSIS — I493 Ventricular premature depolarization: Secondary | ICD-10-CM | POA: Diagnosis not present

## 2023-09-23 DIAGNOSIS — I5022 Chronic systolic (congestive) heart failure: Secondary | ICD-10-CM

## 2023-09-23 DIAGNOSIS — I428 Other cardiomyopathies: Secondary | ICD-10-CM | POA: Diagnosis not present

## 2023-09-23 NOTE — Patient Instructions (Signed)

## 2023-10-10 ENCOUNTER — Other Ambulatory Visit: Payer: Self-pay | Admitting: Internal Medicine

## 2023-10-10 ENCOUNTER — Other Ambulatory Visit: Payer: Self-pay | Admitting: Cardiology

## 2023-10-13 ENCOUNTER — Other Ambulatory Visit: Payer: Self-pay | Admitting: Family Medicine

## 2023-10-13 ENCOUNTER — Ambulatory Visit
Admission: RE | Admit: 2023-10-13 | Discharge: 2023-10-13 | Disposition: A | Discharge status: Home / Self Care | Source: Ambulatory Visit | Attending: Family Medicine | Admitting: Family Medicine

## 2023-10-13 DIAGNOSIS — M545 Low back pain, unspecified: Secondary | ICD-10-CM

## 2023-10-16 ENCOUNTER — Other Ambulatory Visit: Payer: Self-pay

## 2023-10-16 ENCOUNTER — Emergency Department (HOSPITAL_COMMUNITY)

## 2023-10-16 ENCOUNTER — Emergency Department (HOSPITAL_COMMUNITY)
Admission: EM | Admit: 2023-10-16 | Discharge: 2023-10-16 | Disposition: A | Discharge status: Home / Self Care | Attending: Emergency Medicine | Admitting: Emergency Medicine

## 2023-10-16 DIAGNOSIS — K429 Umbilical hernia without obstruction or gangrene: Secondary | ICD-10-CM | POA: Insufficient documentation

## 2023-10-16 DIAGNOSIS — W109XXA Fall (on) (from) unspecified stairs and steps, initial encounter: Secondary | ICD-10-CM | POA: Diagnosis not present

## 2023-10-16 DIAGNOSIS — Z7982 Long term (current) use of aspirin: Secondary | ICD-10-CM | POA: Insufficient documentation

## 2023-10-16 DIAGNOSIS — M545 Low back pain, unspecified: Secondary | ICD-10-CM | POA: Diagnosis present

## 2023-10-16 DIAGNOSIS — I700 Atherosclerosis of aorta: Secondary | ICD-10-CM | POA: Diagnosis not present

## 2023-10-16 DIAGNOSIS — M62838 Other muscle spasm: Secondary | ICD-10-CM | POA: Diagnosis not present

## 2023-10-16 MED ORDER — METHYLPREDNISOLONE 4 MG PO TBPK
ORAL_TABLET | ORAL | 0 refills | Status: AC
Start: 2023-10-16 — End: ?

## 2023-10-16 MED ORDER — DEXAMETHASONE 4 MG PO TABS
10.0000 mg | ORAL_TABLET | Freq: Once | ORAL | Status: AC
  Administered 2023-10-16: 10 mg via ORAL
  Filled 2023-10-16: qty 3

## 2023-10-16 MED ORDER — LIDOCAINE 5 % EX PTCH
1.0000 | MEDICATED_PATCH | CUTANEOUS | Status: DC
  Administered 2023-10-16: 1 via TRANSDERMAL
  Filled 2023-10-16: qty 1

## 2023-10-16 MED ORDER — OXYCODONE HCL 5 MG PO TABS: 5.0000 mg | ORAL_TABLET | Freq: Four times a day (QID) | ORAL | 0 refills | Status: AC | PRN

## 2023-10-16 NOTE — Discharge Instructions (Addendum)
 Please return if you develop any urinary retention or numbness in your groin area or incontinence of stool or inability to walk, weakness.  Overall I do think you likely have contusion muscle spasm irritation of nerve in your low back.  Please follow-up closely with your primary care doctor.  You may need physical therapy, may be an MRI of the spine to further evaluate.  I have prescribed you narcotic pain medicine please be careful with this medicine as it is sedating.  Do not mix with alcohol  drugs or dangerous activity including driving.  Have also prescribed you a strong anti-inflammatory called Medrol  Dosepak.  Take next dose tomorrow morning and as prescribed.

## 2023-10-16 NOTE — ED Triage Notes (Signed)
 Pt is coming in for back pain after a fall that occurred on Monday morning in which she was walking up the steps outside of her house ( 3 steps ) and ended up falling backwards on her butt and back. She did have some imaging done and it showed no fractures but she is still in a good amount of pain. She is otherwise stable at this time.

## 2023-10-16 NOTE — ED Notes (Signed)
 Discharge instructions reviewed.   Newly prescribed medications discussed. Pharmacy verified.   Opportunity for questions and concerns provided.   Alert, oriented and escorted to spouses vehicle via wheelchair. Aaron Aas   Displays no signs of distress.

## 2023-10-16 NOTE — ED Provider Notes (Signed)
 [place] EMERGENCY DEPARTMENT AT [facility] Provider Note   CSN: [id number] Arrival date & time: 10/16/23  2029     Patient presents with: Back Pain   Kristine Curtis is a 63 y.o. female.   Patient here with ongoing hip pain and back pain after fall couple days ago.  [name] backwards from a couple steps onto her buttocks and low back.  Had an x-ray done couple days ago that was unremarkable.  She was prescribed narcotic pain medicine which has helped as well as diazepam .  But still having some pain and spasms in her low back at times.  No loss of bowel or bladder.  No numbness or tingling.  She feels a lot of discomfort when trying to flex and extend her right leg making it feel weak at times.  She has been able to ambulate.  She denies any headache neck pain or other pain elsewhere.  Not on any blood thinners.  The history is provided by the patient.       Prior to Admission medications   Medication Sig Start Date End Date Taking? Authorizing Provider  methylPREDNISolone  (MEDROL  DOSEPAK) 4 MG TBPK tablet Follow package insert 10/16/23  Yes Suzan Manon, DO  oxyCODONE  (ROXICODONE ) 5 MG immediate release tablet Take 1 tablet (5 mg total) by mouth every 6 (six) hours as needed for up to 15 doses for breakthrough pain. 10/16/23  Yes Janille Draughon, DO  albuterol  (VENTOLIN  HFA) 108 (90 Base) MCG/ACT inhaler Inhale 1-2 puffs into the lungs every 6 (six) hours as needed. 06/13/23   Wellington Half, NP  Alpha-Lipoic Acid 600 MG CAPS 1 capsule Orally Once a day    [provider]  aspirin -acetaminophen -caffeine  (HEADACHE RELIEF) 250-250-65 MG tablet Take by mouth every 6 (six) hours as needed for headache.    [provider]  Atogepant (QULIPTA PO) Take by mouth.    [provider]  Bacillus Coagulans-Inulin (PROBIOTIC-PREBIOTIC) 1-250 BILLION-MG CAPS Take by mouth.    [provider]  buPROPion  (WELLBUTRIN  XL) 300 MG 24 hr tablet TAKE 1 TABLET BY MOUTH  DAILY 08/12/23   [place], Peter M, MD  carvedilol  (COREG ) 6.25 MG tablet Take 1 tablet (6.25 mg total) by mouth 2 (two) times daily with a meal. 08/12/23   [place], Peter M, MD  cetirizine (ZYRTEC) 10 MG tablet Take 10 mg by mouth daily.    [provider]  Cholecalciferol  (VITAMIN D -3 PO) Take 2,000 Units by mouth daily.    [provider]  Dextrose-Fructose-Sod Citrate (NAUZENE) 968-175-230 MG CHEW Chew by mouth.    [provider]  diazepam  (VALIUM ) 5 MG tablet TAKE ONE TABLET BY MOUTH EVERY DAY AS NEEDED FOR ANXIETY 07/26/21   Luevenia Saha, MD  ESTRADIOL PO Take by mouth.    [provider]  fluticasone  (FLONASE ) 50 MCG/ACT nasal spray Place 1 spray into both nostrils daily.    [provider]  fluticasone -salmeterol (ADVAIR) 100-50 MCG/ACT AEPB Inhale 1 puff into the lungs 2 (two) times daily. 07/06/21   Luevenia Saha, MD  furosemide  (LASIX ) 20 MG tablet Take 1 tablet (20 mg total) by mouth daily as needed for edema. 07/03/20   [place], Peter M, MD  glucose blood [facility] ULTRA) test strip Use to check blood sugar TID 12/27/20   Jonn Nett, DO  hydroxychloroquine  (PLAQUENIL ) 200 MG tablet Take 2 tablets (400 mg total) by mouth every morning. 08/12/18   Jonn Nett, DO  JARDIANCE  10 MG TABS  tablet TAKE ONE TABLET EACH MORNING BEFORE BREAKFAST 10/10/23   [place], Peter M, MD  Lancets [facility] ULTRASOFT) lancets Use to check blood sugar TID 12/27/20   Jonn Nett, DO  levothyroxine  (SYNTHROID ) 100 MCG tablet TAKE ONE TABLET EVERY DAY 03/25/22   Luevenia Saha, MD  lidocaine  (LIDODERM ) 5 % Place 1 patch onto the skin daily. Remove & Discard patch within 12 hours or as directed by MD 09/28/22   Immordino, Mara Seminole, FNP  losartan  (COZAAR ) 50 MG tablet TAKE 1 TABLET BY MOUTH ONCE DAILY 10/10/23   [place], Peter M, MD  Meclizine HCl 25 MG CHEW Chew by mouth daily as needed.    [provider]  metoCLOPramide  (REGLAN ) 5 MG tablet Take 1 tablet (5 mg  total) by mouth every 6 (six) hours as needed for nausea. 05/09/21   Kenney Peacemaker, MD  nystatin -triamcinolone  ointment (MYCOLOG) Apply 1 application topically 2 (two) times daily as needed. 02/01/21   Luevenia Saha, MD  omeprazole  (PRILOSEC) 40 MG capsule TAKE 1 CAPSULE BY MOUTH ONCE DAILY BEFORE BREAKFAST 10/10/23   Kenney Peacemaker, MD  ondansetron  (ZOFRAN  ODT) 4 MG disintegrating tablet Take 1 tablet (4 mg total) by mouth every 8 (eight) hours as needed for nausea or vomiting. 01/11/21   Jonn Nett, DO  OVER THE COUNTER MEDICATION Take 5 capsules by mouth daily. [employer]imals Vitamin [employer] Minerals    [provider]  OVER THE COUNTER MEDICATION Take 1 scoop by mouth daily. [employer]ix Magnecal D    [provider]  Peak Flow Meter DEVI Use as needed to check your breathing. Log your values 07/06/21   Luevenia Saha, MD  rizatriptan (MAXALT) 10 MG tablet Take 10 mg by mouth as needed for migraine. May repeat in 2 hours if needed    [provider]  spironolactone  (ALDACTONE ) 25 MG tablet TAKE ONE TABLET EVERY DAY 03/25/22   Luevenia Saha, MD  topiramate  (TOPAMAX ) 25 MG tablet Take 1 tablet (25 mg total) by mouth 2 (two) times daily. 08/12/18   Jonn Nett, DO  TYRVAYA 0.03 MG/ACT SOLN Place into both nostrils. 10/29/21   [provider]  UNABLE TO FIND Med Name: Nutrifil: Optimal-V, Vinali, Optimal-M, Magnical-D    [provider]  Wheat Dextrin (BENEFIBER) POWD Take 2 scoop by mouth daily as needed (for constipation).     [provider]  zinc gluconate 50 MG tablet Take 50 mg by mouth daily.    [provider]    Allergies: Gabapentin (once-daily), Saxenda  [liraglutide  -weight management], Sulfa antibiotics, Trintellix  [vortioxetine ], Adhesive [tape], and Codeine    Review of Systems  Updated Vital Signs BP 114/89   Pulse 98   Temp 98.2 F (36.8 C)   Resp 18   SpO2 100%   Physical Exam Vitals and nursing note reviewed.   Constitutional:      General: She is not in acute distress.    Appearance: She is well-developed. She is not ill-appearing.  HENT:     Head: Normocephalic and atraumatic.     Nose: Nose normal.     Mouth/Throat:     Mouth: Mucous membranes are moist.   Eyes:     Extraocular Movements: Extraocular movements intact.     Conjunctiva/sclera: Conjunctivae normal.     Pupils: Pupils are equal, round, and reactive to light.    Cardiovascular:     Rate and Rhythm: Normal rate and regular rhythm.     Pulses: Normal pulses.  Heart sounds: Normal heart sounds. No murmur heard. Pulmonary:     Effort: Pulmonary effort is normal. No respiratory distress.     Breath sounds: Normal breath sounds.  Abdominal:     Palpations: Abdomen is soft.     Tenderness: There is no abdominal tenderness.   Musculoskeletal:        General: Tenderness present. No swelling. Normal range of motion.     Cervical back: Normal range of motion and neck supple.     Comments: Tenderness to the paraspinal lumbar muscles bilaterally with increased tone especially on the right, some tenderness over the pelvis region bilaterally   Skin:    General: Skin is warm and dry.     Capillary Refill: Capillary refill takes less than 2 seconds.   Neurological:     General: No focal deficit present.     Mental Status: She is alert and oriented to person, place, and time.     Cranial Nerves: No cranial nerve deficit.     Sensory: No sensory deficit.     Motor: No weakness.     Coordination: Coordination normal.     Comments: She appears at 5+ out of 5 strength throughout, strength somewhat limited on evaluation of the right lower leg secondary to pain in her low back but able to ambulate without any issues, normal speech  Psychiatric:        Mood and Affect: Mood normal.     (all labs ordered are listed, but only abnormal results are displayed) Labs Reviewed - No data to display  EKG: None  Radiology: CT PELVIS  WO CONTRAST Result Date: 10/16/2023 CLINICAL DATA:  [name] 3 days ago landing on her buttocks and back. No acute radiographic findings on lumbar film series at that time. Continued pain in the back and hips. EXAM: CT PELVIS WITHOUT CONTRAST TECHNIQUE: Multidetector CT imaging of the pelvis was performed following the standard protocol without intravenous contrast. RADIATION DOSE REDUCTION: This exam was performed according to the departmental dose-optimization program which includes automated exposure control, adjustment of the mA and/or kV according to patient size and/or use of iterative reconstruction technique. COMPARISON:  CT abdomen and pelvis and reconstructions 06/11/2019 FINDINGS: Urinary Tract: The the ureters insert low in the pelvis consistent with pelvic floor laxity but there is no cystocele. The pelvic ureters are small in caliber without evidence of stones. The bladder is nondistended but unremarkable for the degree of distension. Bowel: No dilatation or wall thickening. Moderate retained stool cecum and proximal ascending colon. An appendix is not seen in this patient. Vascular/Lymphatic: Mild aortoiliac calcific plaques. No other significant vascular findings. No adenopathy. Reproductive:  Surgically absent uterus.  No adnexal mass is seen. Other: Small umbilical fat hernia. No incarcerated hernia. No free fluid, free hemorrhage or free air. Musculoskeletal: No evidence of sacral, coccygeal, pelvic or proximal femoral fractures. There is osteopenia. There is mild to moderate nonerosive bilateral hip DJD, spurring at the SI joints and symphysis pubis, degenerative changes in the visualized lower lumbar spine, and a calcified disc protrusion at L3-4 with acquired spinal stenosis. There are left anterolateral bridging osteophytes at L5-S1, greater than previously. There is a bone island in the left pubic bone. No focal pathologic lesion is seen. IMPRESSION: 1. No evidence of pelvic, sacral, coccygeal  or proximal femoral fractures. 2. Degenerative changes.  Osteopenia. 3. Pelvic floor laxity.  No cystocele. 4. Aortoiliac atherosclerosis. 5. Small umbilical fat hernia. 6. Constipation. Aortic Atherosclerosis (ICD10-I70.0). Electronically Signed  By: Denman Fischer M.D.   On: 10/16/2023 21:51   CT Lumbar Spine Wo Contrast Result Date: 10/16/2023 CLINICAL DATA:  Back pain after a fall. Fell backwards onto butt and back. EXAM: CT LUMBAR SPINE WITHOUT CONTRAST TECHNIQUE: Multidetector CT imaging of the lumbar spine was performed without intravenous contrast administration. Multiplanar CT image reconstructions were also generated. RADIATION DOSE REDUCTION: This exam was performed according to the departmental dose-optimization program which includes automated exposure control, adjustment of the mA and/or kV according to patient size and/or use of iterative reconstruction technique. COMPARISON:  Lumbar spine radiographs 10/13/2023 and MRI 10/02/2014 FINDINGS: Segmentation: There are 6 lumbar type vertebral bodies. The most inferior lumbar vertebral body is designated S1. Careful attention to numbering is recommended prior to any intervention. Alignment:  No evidence of traumatic listhesis. Vertebrae: No acute fracture Paraspinal and other soft tissues: Aortic atherosclerotic calcification. Disc levels: Degenerative disc disease with vacuum phenomenon degenerative endplate changes L4-L5 (this was previously labeled L3-L4 on MRI 10/02/2014). Posterior disc osteophyte complex at L4-L5 causes moderate spinal canal narrowing, increased from MRI 10/02/2014. IMPRESSION: 1. No acute fracture in the lumbar spine. 2. Six lumbar type vertebral bodies. Careful attention to numbering is recommended prior to any intervention. 3. Degenerative disc disease at L4-L5 with moderate spinal canal narrowing, increased from MRI 10/02/2014. 4. Aortic Atherosclerosis (ICD10-I70.0). Electronically Signed   By: Rozell Cornet M.D.   On:  10/16/2023 21:45     Procedures   Medications Ordered in the ED  lidocaine  (LIDODERM ) 5 % 1 patch (1 patch Transdermal Patch Applied 10/16/23 2140)  dexamethasone  (DECADRON ) tablet 10 mg (has no administration in time range)                                    Medical Decision Making Amount and/or Complexity of Data Reviewed Radiology: ordered.  Risk Prescription drug management.   Vernesha R Sellitto is here with back pain and pelvic pain after fall a couple days ago.  X-rays were unremarkable but still having some pain at times.  She appears neurologically neurovascularly intact on exam.  She is tender in bilateral pelvis and bilateral lumbar soft tissue area.  No specific midline spinal tenderness.  She is got good pulses throughout.  Good sensation.  No cauda equina symptoms.  Differential diagnosis likely ongoing contusion muscle spasms but will get CT scans of the low back and pelvis to evaluate for any occult fractures.  X-ray imaging earlier this week was unremarkable.  I do not think she has any spinal cord injury.  She has been taking narcotic pain medicine and diazepam  with some relief.  But has finished her narcotic prescription and still having discomfort.  Ultimately will give her lidocaine  patch here.  She just took pain medicine and muscle relaxant prior to coming here.  She is ambulatory and overall suspect she will do well but will get CT images to further evaluate.  Will consider doing a Medrol  Dosepak as well.  No acute findings on CT scan of the low back or the pelvis.  No fracture.  Overall some degenerative disc disease at L4-L5 with moderate spinal canal narrowing which is increased from MRI from about 9 years ago.  Ultimately I do not think she has any symptoms of cauda equina and no need for emergent MRI tonight.  Will treat for muscle spasms, nerve irritation with Medrol  Dosepak Roxicodone  and continued use of lidocaine   patches over-the-counter and Tylenol .  Ibuprofen   if she can take it.  Close follow-up with primary care.  Understands return precautions.  Discharge.  This chart was dictated using voice recognition software.  Despite best efforts to proofread,  errors can occur which can change the documentation meaning.      Final diagnoses:  Acute bilateral low back pain, unspecified whether sciatica present  Muscle spasm    ED Discharge Orders          Ordered    methylPREDNISolone  (MEDROL  DOSEPAK) 4 MG TBPK tablet        10/16/23 2154    oxyCODONE  (ROXICODONE ) 5 MG immediate release tablet  Every 6 hours PRN        10/16/23 2154               Lowery Rue, DO 10/16/23 2156

## 2023-10-21 ENCOUNTER — Other Ambulatory Visit: Payer: Self-pay | Admitting: Family Medicine

## 2023-10-21 DIAGNOSIS — M545 Low back pain, unspecified: Secondary | ICD-10-CM

## 2023-10-24 ENCOUNTER — Ambulatory Visit
Admission: RE | Admit: 2023-10-24 | Discharge: 2023-10-24 | Disposition: A | Discharge status: Home / Self Care | Source: Ambulatory Visit | Attending: Family Medicine | Admitting: Family Medicine

## 2023-10-24 DIAGNOSIS — M545 Low back pain, unspecified: Secondary | ICD-10-CM

## 2023-10-31 ENCOUNTER — Telehealth: Payer: Self-pay | Admitting: Internal Medicine

## 2023-10-31 ENCOUNTER — Other Ambulatory Visit: Payer: Self-pay

## 2023-10-31 MED ORDER — HYDROCORTISONE (PERIANAL) 2.5 % EX CREA: 1.0000 | TOPICAL_CREAM | Freq: Two times a day (BID) | CUTANEOUS | 1 refills | Status: AC

## 2023-10-31 NOTE — Telephone Encounter (Signed)
 MiraLAX purge today (4 Dulcolax wait 1 hour then drink 6 doses of MiraLAX over the next 2 hours)  Start taking MiraLAX 1 dose every day (17 g in 6 ounce water) after that  She has a history of hemorrhoids seen on last colonoscopy and that is likely the cause of bleeding  I pended a hydrocortisone  cream prescription for suspected hemorrhoidal bleeding you can fill that once we know the pharmacy is correct  If she continues to have the same problems and if the bleeding persists or worsens then she would need to go to the emergency department to be checked while she waits to see us  in the office

## 2023-10-31 NOTE — Telephone Encounter (Addendum)
 Inbound call from patient, states she had a fall a couple of weeks ago on 6/8. She states since then she has had constant pain underneath her breast all the way down to her hip. She states she had an Xray and CT and came back clear. She states she had an MRI and has a compression fracture. She states she has went 9 days without a bowel movement. She states she drank 2 1/2 bottles of magnesium citrate to have a bowel movement. She states it was complete liquid and blood in toilet. She states since last Friday she has not had a bowel movement. She states she has drank a bottle 1/2 along with enema's and did not have a bowel movement. She would like to speak to a nurse in regards. Please advise.

## 2023-10-31 NOTE — Telephone Encounter (Signed)
 Reviewed with the patient. Patient is presently in her pharmacy ([facility]) and is unable to write the instructions down. She confirms she accesses My Chart and it is okay to send the instructions through a patient message.

## 2023-10-31 NOTE — Telephone Encounter (Signed)
 Spoke with patient. See previous note. She tells me today she went to empty her bladder and could feel something passing from the rectum. When it was over, she saw only bright red blood and no stool. This has happened 3 or 4 times. It is painless. She has abdominal pain and bloating. Eating and drinking intensify this pain. It is describes as sharp and pulling. Patient also tells me she thinks some of the pain may be a result of her fall. She is not on narcotics. No fever, nausea or vomiting. Normal bowel habit before the fall were to have a bowel movement 3 times a week described as formed and dry. I have scheduled her for follow up on July 7 with [place], [place]. Patient is hoping for some recommendations today. Thanks

## 2023-11-03 ENCOUNTER — Telehealth: Payer: Self-pay | Admitting: Internal Medicine

## 2023-11-03 NOTE — Telephone Encounter (Signed)
 Spoke with patient. She feels she had good results with the purge as she was instructed. She will now transition to daily Miralax.

## 2023-11-03 NOTE — Telephone Encounter (Signed)
 Patient called and stated that she was speaking to [facility] last week regarding having constipation. Patient stated that Saturday and Sunday she was able to have a BM but she is now wondering if she needs to be a stool softener or what she is needing to do. Patient is requesting a call back.Please advise.

## 2023-11-10 ENCOUNTER — Other Ambulatory Visit: Payer: Self-pay | Admitting: Cardiology

## 2023-11-13 ENCOUNTER — Encounter: Payer: Self-pay | Admitting: Gastroenterology

## 2023-11-13 ENCOUNTER — Ambulatory Visit (INDEPENDENT_AMBULATORY_CARE_PROVIDER_SITE_OTHER): Admitting: Gastroenterology

## 2023-11-13 VITALS — BP 118/68 | HR 87 | Ht 63.0 in | Wt 197.0 lb

## 2023-11-13 DIAGNOSIS — R1011 Right upper quadrant pain: Secondary | ICD-10-CM

## 2023-11-13 DIAGNOSIS — G8929 Other chronic pain: Secondary | ICD-10-CM | POA: Diagnosis not present

## 2023-11-13 DIAGNOSIS — K5900 Constipation, unspecified: Secondary | ICD-10-CM

## 2023-11-13 DIAGNOSIS — R109 Unspecified abdominal pain: Secondary | ICD-10-CM

## 2023-11-13 NOTE — Progress Notes (Addendum)
 11/13/2023 Kristine Curtis [id number] 1961/01/03   HISTORY OF PRESENT ILLNESS: This is a 63 year old female who is a patient of Dr. Rama.  She has history of H. pylori gastritis, heartburn, chronic right upper quadrant abdominal pain, small fiber neuropathy, chronic nausea, fibromyalgia, irritable bowel syndrome with diarrhea, tinnitus.  Looks like also had a previous diagnosis of lymphocytic colitis.  Last seen by him March or 2023.    She is complaining of constipation.  Says she is having trouble moving her bowels.  Says that at times has not moved her bowels in 9 days.  Given a MiraLAX bowel purge.  Says she does not like how the MiraLAX makes her feel, said it makes her feel gripey and unstable in her stomach.  She also complains of some right upper quadrant abdominal pain that she says began back in June after she had a fall.  She says it starts on the right side and runs across to the midline of her right abdomen.  Sometimes it is even very painful just a very superficial touch.  Was having some issues with hemorrhoids has been using hydrocortisone  with improvement.  Colonoscopy 06/2019:  - The examined portion of the ileum was normal. - External and internal hemorrhoids. - The examination was otherwise normal on direct and retroflexion views. - Biopsies were taken with a cold forceps from the ascending colon, transverse colon, descending colon and sigmoid colon for evaluation of microscopic colitis.  Surgical [P], random sites - COLONIC MUCOSA WITH INCREASED INTRAEPITHELIAL LYMPHOCYTES AND FEW LAMINA PROPRIA NEUTROPHILS.  EGD 05/2021:  - Gastritis. Biopsied. ? from Plaquenil  - The examination was otherwise normal.  Surgical [P], gastric antrum - MILD CHRONIC GASTRITIS WITH REACTIVE CHANGES AND ASSOCIATED HELICOBACTER PYLORI INFECTION (H. PYLORI IDENTIFIED ON H. PYLORI IHC STAIN, WITH APPROPRIATE CONTROL). - NEGATIVE FOR INTESTINAL METAPLASIA. - NEGATIVE FOR  MALIGNANCY.   Past Medical History:  Diagnosis Date   Allergic rhinitis due to pollen    Anemia    Anxiety    Asthma    Carpal tunnel syndrome of right wrist 12/22/2017   Cataract    Chronic combined systolic and diastolic CHF (congestive heart failure) (HCC)    06/2016 Echo: EF 40-45%, Gr1 DD   Chronic fatigue    Depression    Fall    Family history of polycystic kidney with negative CT 11/16/2012   Fibromyalgia    GERD (gastroesophageal reflux disease)    Helicobacter pylori gastritis 05/2020   Hypertension    Hypothyroidism    IBS (irritable bowel syndrome)    Internal hemorrhoids    Lymphocytic colitis    Morbid obesity (HCC)    NICM (nonischemic cardiomyopathy) (HCC)    a. 1999 Cath: nl cors;  b. EF prev as low as 35%;  c. 11/2015 Echo: Ef 40-45%;  d. 06/2016 Echo: EF 40-45%;  e. Lexiscan  MV: fixed anterosepta/inferseptal defect w/o ischemia, EF 35%.   Osteoarthritis    PONV (postoperative nausea and vomiting)    PVC (premature ventricular contraction)    Restless leg syndrome    Sjogren's syndrome (HCC)    Vertigo    Past Surgical History:  Procedure Laterality Date   ABDOMINAL HERNIA REPAIR     ABDOMINAL HYSTERECTOMY     CESAREAN SECTION     CHOLECYSTECTOMY  10/2008   COLONOSCOPY  02/14/2015   ESOPHAGOGASTRODUODENOSCOPY     EYE SURGERY     2 2013 and 1981/DCR of right eye 05/2014   LUMBAR DISC  SURGERY  2016   L3-4   RIGHT/LEFT HEART CATH AND CORONARY ANGIOGRAPHY N/A 07/11/2016   Procedure: Right/Left Heart Cath and Coronary Angiography;  Surgeon: Peter M Swaz[place]and, MD;  Location: [facility] INVASIVE CV LAB;  Service: Cardiovascular;  Laterality: N/A;   SHOULDER ARTHROSCOPY W/ SUBACROMIAL DECOMPRESSION AND DISTAL CLAVICLE EXCISION Right    SPINE SURGERY     C6-C7, 03/2017   TUBAL LIGATION  1992    reports that she quit smoking about 34 years ago. Her smoking use included cigarettes. She started smoking about 46 years ago. She has a 24 pack-year smoking history. She has  never used smokeless tobacco. She reports that she does not drink alcohol  and does not use drugs. family history includes Alcoholism in her father; Depression in her mother; Diabetes in her father; Glaucoma in her father; Heart disease in her father; High blood pressure in her father; Kidney failure in her brother; Obesity in her father and mother; Pancreatic cancer in her mother; Thyroid  cancer in her father and mother. Allergies  Allergen Reactions   Gabapentin (Once-Daily)     Depression    Saxenda  [Liraglutide  -Weight Management] Other (See Comments)    Depression    Sulfa Antibiotics     itching   Trintellix  [Vortioxetine ] Other (See Comments)    GI issues    Adhesive [Tape] Rash   Codeine Itching      Outpatient Encounter Medications as of 11/13/2023  Medication Sig   albuterol  (VENTOLIN  HFA) 108 (90 Base) MCG/ACT inhaler Inhale 1-2 puffs into the lungs every 6 (six) hours as needed.   Alpha-Lipoic Acid 600 MG CAPS 1 capsule Orally Once a day   aspirin -acetaminophen -caffeine  (HEADACHE RELIEF) 250-250-65 MG tablet Take by mouth every 6 (six) hours as needed for headache.   Atogepant (QULIPTA PO) Take by mouth.   Bacillus Coagulans-Inulin (PROBIOTIC-PREBIOTIC) 1-250 BILLION-MG CAPS Take by mouth.   buPROPion  (WELLBUTRIN  XL) 300 MG 24 hr tablet Take 1 tablet (300 mg total) by mouth daily.   carvedilol  (COREG ) 6.25 MG tablet Take 1 tablet (6.25 mg total) by mouth 2 (two) times daily with a meal.   cetirizine (ZYRTEC) 10 MG tablet Take 10 mg by mouth daily.   Cholecalciferol  (VITAMIN D -3 PO) Take 2,000 Units by mouth daily.   Dextrose-Fructose-Sod Citrate (NAUZENE) 968-175-230 MG CHEW Chew by mouth.   diazepam  (VALIUM ) 5 MG tablet TAKE ONE TABLET BY MOUTH EVERY DAY AS NEEDED FOR ANXIETY   ESTRADIOL PO Take by mouth.   fluticasone  (FLONASE ) 50 MCG/ACT nasal spray Place 1 spray into both nostrils daily.   fluticasone -salmeterol (ADVAIR) 100-50 MCG/ACT AEPB Inhale 1 puff into the lungs 2  (two) times daily.   furosemide  (LASIX ) 20 MG tablet Take 1 tablet (20 mg total) by mouth daily as needed for edema.   glucose blood (ONETOUCH ULTRA) test strip Use to check blood sugar TID   hydrocortisone  (ANUSOL -HC) 2.5 % rectal cream Place 1 Application rectally 2 (two) times daily.   hydroxychloroquine  (PLAQUENIL ) 200 MG tablet Take 2 tablets (400 mg total) by mouth every morning.   JARDIANCE  10 MG TABS tablet TAKE ONE TABLET EACH MORNING BEFORE BREAKFAST   Lancets (ONETOUCH ULTRASOFT) lancets Use to check blood sugar TID   levothyroxine  (SYNTHROID ) 100 MCG tablet TAKE ONE TABLET EVERY DAY   lidocaine  (LIDODERM ) 5 % Place 1 patch onto the skin daily. Remove & Discard patch within 12 hours or as directed by MD   losartan  (COZAAR ) 50 MG tablet TAKE 1 TABLET BY MOUTH ONCE  DAILY   Meclizine HCl 25 MG CHEW Chew by mouth daily as needed.   methylPREDNISolone  (MEDROL  DOSEPAK) 4 MG TBPK tablet Follow package insert   metoCLOPramide  (REGLAN ) 5 MG tablet Take 1 tablet (5 mg total) by mouth every 6 (six) hours as needed for nausea.   nystatin -triamcinolone  ointment (MYCOLOG) Apply 1 application topically 2 (two) times daily as needed.   omeprazole  (PRILOSEC) 40 MG capsule TAKE 1 CAPSULE BY MOUTH ONCE DAILY BEFORE BREAKFAST   ondansetron  (ZOFRAN  ODT) 4 MG disintegrating tablet Take 1 tablet (4 mg total) by mouth every 8 (eight) hours as needed for nausea or vomiting.   OVER THE COUNTER MEDICATION Take 5 capsules by mouth daily. Ariix Optimals Vitamin & Minerals   OVER THE COUNTER MEDICATION Take 1 scoop by mouth daily. Ariix Magnecal D   oxyCODONE  (ROXICODONE ) 5 MG immediate release tablet Take 1 tablet (5 mg total) by mouth every 6 (six) hours as needed for up to 15 doses for breakthrough pain.   Peak Flow Meter DEVI Use as needed to check your breathing. Log your values   rizatriptan (MAXALT) 10 MG tablet Take 10 mg by mouth as needed for migraine. May repeat in 2 hours if needed   spironolactone   (ALDACTONE ) 25 MG tablet TAKE ONE TABLET EVERY DAY   topiramate  (TOPAMAX ) 25 MG tablet Take 1 tablet (25 mg total) by mouth 2 (two) times daily.   TYRVAYA 0.03 MG/ACT SOLN Place into both nostrils.   UNABLE TO FIND Med Name: Nutrifil: Optimal-V, Vinali, Optimal-M, Magnical-D   Wheat Dextrin (BENEFIBER) POWD Take 2 scoop by mouth daily as needed (for constipation).    zinc gluconate 50 MG tablet Take 50 mg by mouth daily.   No facility-administered encounter medications on file as of 11/13/2023.     REVIEW OF SYSTEMS  : All other systems reviewed and negative except where noted in the History of Present Illness.   PHYSICAL EXAM: BP 118/68   Pulse 87   Ht 5' 3 (1.6 m)   Wt 197 lb (89.4 kg)   BMI 34.90 kg/m  General: Well developed white female in no acute distress Head: Normocephalic and atraumatic Eyes:  Sclerae anicteric, conjunctiva pink. Ears: Normal auditory acuity Lungs: Clear throughout to auscultation; no W/R/R. Heart: Regular rate and rhythm; no M/R/G. Abdomen: Soft, non-distended.  BS present.  Minimal TTP on the right. Musculoskeletal: Symmetrical with no gross deformities  Skin: No lesions on visible extremities Extremities: No edema  Neurological: Alert oriented x 4, grossly non-focal Psychological:  Alert and cooperative. Normal mood and affect  ASSESSMENT AND PLAN: *Constipation: Historically it seems with her IBS she tended to have diarrhea.  CT scan in June suggested constipation and she reports sometimes not having a bowel movement for 9 days.  Will treat with Linzess  72 mcg daily to see if that helps.  She was given samples.  Will contact us  back in 10 to 12 days with an update via MyChart.  Does not like the way MiraLAX makes her Feel. *Right sided abdominal pain: Pain seems to have started after her fall on 6/12.  Having back pain as well.  Question if this is radiated from her back.  She describes the pain sometimes as being painful even with very superficial  touch so seems like it could be nerve related.  CT of the pelvis without contrast showed degenerative disc changes, pelvic floor laxity constipation.  It appears that she has some chronic abdominal pain complaints with her IBS as  well (particularly RUQ abdominal pain).  Reassurance given.   CC:  Hilts, Michael, MD     [facility] Attending    I agree with the Advanced Practitioner's note, impression and recommendations with the following additions:  Given pelvic floor laxity seen on CT it would be appropriate to refer for pelvic floor PT as I suspect pelvic floor dysfunction contributing to her bowel habit difficulties.  Lupita CHARLENA Commander, MD, NOLIA

## 2023-11-13 NOTE — Patient Instructions (Addendum)
 We have given you samples of the following medication to take: Linzess 72 mcg daily before breakfast.   Call back in 10-12 days with an update.   _______________________________________________________  If your blood pressure at your visit was 140/90 or greater, please contact your primary care physician to follow up on this.  _______________________________________________________  If you are age 64 or older, your body mass index should be between 23-30. Your Body mass index is 34.9 kg/m. If this is out of the aforementioned range listed, please consider follow up with your Primary Care Provider.  If you are age 72 or younger, your body mass index should be between 19-25. Your Body mass index is 34.9 kg/m. If this is out of the aformentioned range listed, please consider follow up with your Primary Care Provider.   ________________________________________________________  The  GI providers would like to encourage you to use MYCHART to communicate with providers for non-urgent requests or questions.  Due to long hold times on the telephone, sending your provider a message by [facility] may be a faster and more efficient way to get a response.  Please allow 48 business hours for a response.  Please remember that this is for non-urgent requests.  _______________________________________________________

## 2023-11-20 ENCOUNTER — Encounter: Payer: Self-pay | Admitting: Gastroenterology

## 2023-11-21 DIAGNOSIS — K5900 Constipation, unspecified: Secondary | ICD-10-CM | POA: Insufficient documentation

## 2023-11-27 ENCOUNTER — Other Ambulatory Visit: Payer: Self-pay | Admitting: *Deleted

## 2023-11-27 DIAGNOSIS — K5900 Constipation, unspecified: Secondary | ICD-10-CM

## 2023-11-27 MED ORDER — LINACLOTIDE 72 MCG PO CAPS: 72.0000 ug | ORAL_CAPSULE | Freq: Every day | ORAL | 5 refills | Status: DC

## 2023-11-27 NOTE — Progress Notes (Signed)
 Patient states Linzess  has been helpful. Script sent to pharmacy. Patient agreed to Pelvic Floor PT, referral placed.

## 2023-12-11 ENCOUNTER — Other Ambulatory Visit: Payer: Self-pay | Admitting: Cardiology

## 2024-01-12 ENCOUNTER — Other Ambulatory Visit: Payer: Self-pay | Admitting: Internal Medicine

## 2024-01-20 NOTE — Therapy (Unsigned)
 OUTPATIENT PHYSICAL THERAPY FEMALE PELVIC EVALUATION   Patient Name: Kristine Curtis MRN: [id number] DOB:February 12, 1961, 63 y.o., female Today's Date: 01/21/2024  END OF SESSION:  PT End of Session - 01/21/24 1019     Visit Number 1    Date for PT Re-Evaluation 04/14/24    Progress Note Due on Visit 10    PT Start Time 1015    PT Stop Time 1055    PT Time Calculation (min) 40 min    Activity Tolerance Patient tolerated treatment well    Behavior During Therapy [facility] for tasks assessed/performed          Past Medical History:  Diagnosis Date   Allergic rhinitis due to pollen    Anemia    Anxiety    Asthma    Carpal tunnel syndrome of right wrist 12/22/2017   Cataract    Chronic combined systolic and diastolic CHF (congestive heart failure) (HCC)    06/2016 Echo: EF 40-45%, Gr1 DD   Chronic fatigue    Depression    Fall    Family history of polycystic kidney with negative CT 11/16/2012   Fibromyalgia    GERD (gastroesophageal reflux disease)    Helicobacter pylori gastritis 05/2020   Hypertension    Hypothyroidism    IBS (irritable bowel syndrome)    Internal hemorrhoids    Lymphocytic colitis    Morbid obesity (HCC)    NICM (nonischemic cardiomyopathy) (HCC)    a. 1999 Cath: nl cors;  b. EF prev as low as 35%;  c. 11/2015 Echo: Ef 40-45%;  d. 06/2016 Echo: EF 40-45%;  e. Lexiscan  MV: fixed anterosepta/inferseptal defect w/o ischemia, EF 35%.   Osteoarthritis    PONV (postoperative nausea and vomiting)    PVC (premature ventricular contraction)    Restless leg syndrome    Sjogren's syndrome (HCC)    Vertigo    Past Surgical History:  Procedure Laterality Date   ABDOMINAL HERNIA REPAIR     ABDOMINAL HYSTERECTOMY     CESAREAN SECTION     CHOLECYSTECTOMY  10/2008   COLONOSCOPY  02/14/2015   ESOPHAGOGASTRODUODENOSCOPY     EYE SURGERY     2 2013 and 1981/DCR of right eye 05/2014   LUMBAR DISC SURGERY  2016   L3-4   RIGHT/LEFT HEART CATH AND CORONARY ANGIOGRAPHY  N/A 07/11/2016   Procedure: Right/Left Heart Cath and Coronary Angiography;  Surgeon: Peter M Swaziland, MD;  Location: [facility] INVASIVE CV LAB;  Service: Cardiovascular;  Laterality: N/A;   SHOULDER ARTHROSCOPY W/ SUBACROMIAL DECOMPRESSION AND DISTAL CLAVICLE EXCISION Right    SPINE SURGERY     C6-C7, 03/2017   TUBAL LIGATION  1992   Patient Active Problem List   Diagnosis Date Noted   Constipation 11/21/2023   Helicobacter pylori gastritis 05/15/2021   Hormone replacement therapy (HRT) 07/29/2019   IBS (irritable bowel syndrome) 11/10/2017   B12 deficiency 11/10/2017   OSA (obstructive sleep apnea) 11/10/2017   Myalgia due to statin 11/10/2017   Lumbar disc disease 10/01/2017   RLS (restless legs syndrome) 10/01/2017   Myofascial pain dysfunction syndrome 07/14/2017   Cervical radiculopathy 07/14/2017   Degenerative arthritis of left knee, XRAY12/2018 07/13/2017   Morbid obesity (HCC) 02/24/2017   Sjogren's disease (HCC) 02/24/2017   Fibromyalgia 01/21/2017   Chronic low back pain with sciatica 05/02/2015   Nonischemic cardiomyopathy (HCC) 03/22/2015   Abdominal pain 11/05/2013   Vitamin D  deficiency 10/09/2009   Depression with anxiety 08/31/2008   Essential hypertension 08/31/2008  Allergic rhinitis 08/31/2008   Moderate persistent asthma 08/31/2008   Esophageal reflux 08/31/2008   Acquired hypothyroidism 08/30/2008   Classical migraine without intractable migraine 08/30/2008    PCP: Hughie Sharper, MD  REFERRING PROVIDER: Cayetano Harlene BIRCH, PA-C   REFERRING DIAG: K59.00 (ICD-10-CM) - Constipation, unspecified constipation type   THERAPY DIAG:  Muscle weakness (generalized) - Plan: PT plan of care cert/re-cert  Cramp and spasm - Plan: PT plan of care cert/re-cert  Pelvic pain - Plan: PT plan of care cert/re-cert  Rationale for Evaluation and Treatment: Rehabilitation  ONSET DATE: 10/12/23  SUBJECTIVE:                                                                                                                                                                                            SUBJECTIVE STATEMENT: Patient has had IBS for many years. She feel from the second step on the porch and straight back and landed on her gluteals lumbar area and fractured her T12 and is when severe constipation has started. Patient tried vaginal estrogen but did not help her. She had pelvic physical therapy but no progression  with dilators.  Fluid intake: water, soda  FUNCTIONAL LIMITATIONS: none  PERTINENT HISTORY:  Medications for current condition: Estradiol; Linzess ;  Surgeries: Abdominal hernia repair; Abdominal hysterectomy; cesarean section;  Other: Chronic fatigue,; Fibromyalgia; hypothyroidism; IBS; Lymphocytic colitis; Sjogren's syndrome; Cholecystectomy; Lumbar disc surgery;  Sexual abuse: No  PAIN:  Are you having pain? No  PRECAUTIONS: None  RED FLAGS: None   WEIGHT BEARING RESTRICTIONS: No  FALLS:  Has patient fallen in last 6 months? Yes. Number of falls 1 off her step but not due to balance  OCCUPATION: disability  ACTIVITY LEVEL : active, does weights, working on walking, flexibility exercise  PLOF: Independent  PATIENT GOALS: reduce leakage, improve constipation, improve pelvic floor health   BOWEL MOVEMENT: Pain with bowel movement: No, since she has been on the Linzess  Type of bowel movement:Type (Bristol Stool Scale) Type 5 and 6 with Linzess , Frequency 1-3 days, Strain no , and Splinting no Fully empty rectum: Yes: with linzess  Leakage: No                                                     Fiber supplement/laxative No  URINATION: Pain with urination: No Fully empty bladder: No; has to lean forward and move around and make a conscious effort  Post-void dribble: when she does not take her time Stream: Strong at first then is weak Urgency: Yes  Frequency:during the day 10-12                                Nocturia: Yes:  1    Leakage: Coughing, Sneezing, Laughing, and hold urine too long and bend over Pads/briefs: Yes: if she is going to be someplace without a bathroom then wears a pad  INTERCOURSE:  Ability to have vaginal penetration No  pain level was 10/10 last time she tried so has stopped.  Pain with intercourse: Initial Penetration, During Penetration, and Deep Penetration Dryness: Yes , stopped intercourse due to vaginal dryness Marinoff Scale: 3/3 Lubricant:yes  PREGNANCY: C-section deliveries 2   PROLAPSE: Pressure and Bulge   OBJECTIVE:  Note: Objective measures were completed at Evaluation unless otherwise noted.  DIAGNOSTIC FINDINGS:  none  PATIENT SURVEYS:  PFIQ-7: 90 UIQ-7: 43 POPIQ-7; 48  COGNITION: Overall cognitive status: Within functional limits for tasks assessed     SENSATION: Light touch: Appears intact    FUNCTIONAL TESTS:  Single leg stance:  Rt:3 sec  Lt:2 sec Squat:able to squat   POSTURE: rounded shoulders, forward head, increased thoracic kyphosis, and posterior pelvic tilt   LUMBARAROM/PROM:  A/PROM A/PROM  Eval (% available)  Flexion full  Extension Decreased by 50%  Right lateral flexion Decreased by 25%  Left lateral flexion Decreased by 25%  Right rotation Decreased by 25%  Left rotation Decreased by 25%   (Blank rows = not tested)  LOWER EXTREMITY ROM:  Passive ROM Right eval Left eval  Hip internal rotation 0 0   (Blank rows = not tested)  PALPATION:   Pelvic Alignment: left ASIS is higher  Abdominal: rib cage angle greater than 90 degrees; lifts rib cage to contract abdominal  Diastasis: No Distortion: Yes , increased upper abdominal girth compared to lower  Breathing: chest breather Scar tissue: Yes: cesarean restricted                External Perineal Exam: introitus was small, pink coloring                             Internal Pelvic Floor: tenderness throughout the vaginal canal not able to assess the pelvic  floor rectally  Patient confirms identification and approves PT to assess internal pelvic floor and treatment Yes No emotional/communication barriers or cognitive limitation. Patient is motivated to learn. Patient understands and agrees with treatment goals and plan. PT explains patient will be examined in standing, sitting, and lying down to see how their muscles and joints work. When they are ready, they will be asked to remove their underwear so PT can examine their perineum. The patient is also given the option of providing their own chaperone as one is not provided in our facility. The patient also has the right and is explained the right to defer or refuse any part of the evaluation or treatment including the internal exam. With the patient's consent, PT will use one gloved finger to gently assess the muscles of the pelvic floor, seeing how well it contracts and relaxes and if there is muscle symmetry. After, the patient will get dressed and PT and patient will discuss exam findings and plan of care. PT and patient discuss plan of care, schedule, attendance policy and HEP activities.  PELVIC MMT:   MMT eval  Vaginal 2/5  Internal Anal Sphincter Not able to assess due to pain in the rectum with finger in  External Anal Sphincter Not able to assess due to pain in the rectum with finger in  Puborectalis Not able to assess due to pain in the rectum with finger in  Diastasis Recti none  (Blank rows = not tested)        TONE: Average tone  PROLAPSE: Not able to assess due to small vaginal opening  TODAY'S TREATMENT:                                                                                                                              DATE: 01/21/24  EVAL Examination completed, findings reviewed, pt educated on POC, HEP, and female pelvic floor anatomy, reasoning with pelvic floor assessment internally with pt consent. Pt motivated to participate in PT and agreeable to attempt  recommendations.     PATIENT EDUCATION:  Education details: indings reviewed, pt educated on POC, HEP, and female pelvic floor anatomy, reasoning with pelvic floor assessment internally with pt consent Person educated: Patient Education method: Explanation, Demonstration, Tactile cues, Verbal cues, and Handouts Education comprehension: verbalized understanding, returned demonstration, verbal cues required, tactile cues required, and needs further education  HOME EXERCISE PROGRAM: See above.   ASSESSMENT:  CLINICAL IMPRESSION: Patient is a 63 y.o. female who was seen today for physical therapy evaluation and treatment for constipation. Patient reports difficulty with having a bowel movement since she fell backwards when she missed the second step and fell on the buttocks. Since then she has had increased pressure feeling in the pelvic area so therapist suggested for her to see her gynecologist. Patient reports she is doing better with bowel movement since she has been on Linzess . She is able to have Type 5 daily now but when no on Linzess  takes several days to have a bowel movement.  She has to lean forward and move around and make a conscious effort when urinating to fully empty her bladder. She leaks urine with coughing, sneezing, laughing, and hold urine too long and bend over. She will dribble urine after urinating if she does not sit on the commode long enough. Her introitus is small and dry. Therapist was only able to place her pinky into the vaginal canal and tenderness throughout. Therapist was not able to place her index finger into the rectum due to pain. Patient will benefit from skilled therapy to improve pelvic floor coordination and reduce pain and urinary leakage.   OBJECTIVE IMPAIRMENTS: decreased activity tolerance, decreased coordination, decreased strength, increased fascial restrictions, increased muscle spasms, and pain.   ACTIVITY LIMITATIONS: bending, continence, and  toileting  PARTICIPATION LIMITATIONS: shopping and community activity  PERSONAL FACTORS: 1-2 comorbidities: Abdominal hernia repair; Abdominal hysterectomy; cesarean section;  are also affecting patient's functional outcome.   REHAB POTENTIAL: Good  CLINICAL DECISION MAKING: Evolving/moderate complexity  EVALUATION COMPLEXITY: Moderate  GOALS: Goals reviewed with patient? Yes  SHORT TERM GOALS: Target date: 02/17/24  Patient independent with initial HEP for core and pelvic floor strength.  Baseline: Goal status: INITIAL  2.  Patient educated on how to have a bowel movement with correct breathing to relax the pelvic floor and generate force to push the stool out.  Baseline:  Goal status: INITIAL  3.  Patient educated on vaginal moisturizers to improve the health of the tissue due to the Sjogren's and lack of estrogen.  Baseline:  Goal status: INITIAL   LONG TERM GOALS: Target date: 04/14/24  Patient independent with advanced HEP for core and pelvic floor strength to improve continence.  Baseline:  Goal status: INITIAL  2.  Patient is able to quickly contract her pelvic floor to stop urinary leakage when she sneezes, and laugh.  Baseline:  Goal status: INITIAL  3.  Patient is able to bend over with a full bladder and engage the core and pelvic floor correctly to prevent urine from leaking.  Baseline:  Goal status: INITIAL  4.  Patient is able to place the largest dilator into the vaginal canal so she is able to have vaginal penetration and vaginal exam.  Baseline:  Goal status: INITIAL    PLAN:  PT FREQUENCY: 1-2x/week  PT DURATION: 12 weeks  PLANNED INTERVENTIONS: 97110-Therapeutic exercises, 97530- Therapeutic activity, 97112- Neuromuscular re-education, 97535- Self Care, [id number]- Manual therapy, [id number]- Electrical stimulation (unattended), 20560 (1-2 muscles), 20561 (3+ muscles)- Dry Needling, Patient/Family education, Cryotherapy, Moist heat, and  Biofeedback  PLAN FOR NEXT SESSION: manual work to abdomen to work on diaphragmatic breathing, go over vaginal moisturizers, hip stretches, core engagement   Channing Pereyra, PT 01/21/24 2:35 PM

## 2024-01-21 ENCOUNTER — Encounter: Payer: Self-pay | Admitting: Physical Therapy

## 2024-01-21 ENCOUNTER — Ambulatory Visit: Attending: Gastroenterology | Admitting: Physical Therapy

## 2024-01-21 ENCOUNTER — Other Ambulatory Visit: Payer: Self-pay

## 2024-01-21 DIAGNOSIS — R252 Cramp and spasm: Secondary | ICD-10-CM | POA: Diagnosis present

## 2024-01-21 DIAGNOSIS — R102 Pelvic and perineal pain: Secondary | ICD-10-CM | POA: Insufficient documentation

## 2024-01-21 DIAGNOSIS — M6281 Muscle weakness (generalized): Secondary | ICD-10-CM | POA: Diagnosis present

## 2024-02-05 ENCOUNTER — Other Ambulatory Visit: Payer: Self-pay | Admitting: Obstetrics and Gynecology

## 2024-02-05 DIAGNOSIS — Z0000 Encounter for general adult medical examination without abnormal findings: Secondary | ICD-10-CM

## 2024-02-09 ENCOUNTER — Ambulatory Visit: Attending: Gastroenterology | Admitting: Physical Therapy

## 2024-02-09 ENCOUNTER — Encounter: Payer: Self-pay | Admitting: Physical Therapy

## 2024-02-09 DIAGNOSIS — R252 Cramp and spasm: Secondary | ICD-10-CM | POA: Diagnosis present

## 2024-02-09 DIAGNOSIS — R102 Pelvic and perineal pain unspecified side: Secondary | ICD-10-CM | POA: Diagnosis present

## 2024-02-09 DIAGNOSIS — M6281 Muscle weakness (generalized): Secondary | ICD-10-CM | POA: Diagnosis present

## 2024-02-09 NOTE — Patient Instructions (Signed)
 Moisturizers They are used in the vagina to hydrate the mucous membrane that make up the vaginal canal. Designed to keep a more normal acid balance (ph) Once placed in the vagina, it will last between two to three days.  Use 2-3 times per week at bedtime  Ingredients to avoid is glycerin and fragrance, can increase chance of infection Should not be used just before sex due to causing irritation Most are gels administered either in a tampon-shaped applicator or as a vaginal suppository. They are non-hormonal.   Types of Moisturizers(internal use)  Vitamin E vaginal suppositories- Whole foods, Amazon Moist Again Julva- (Do no use if taking  Tamoxifen) amazon Yes moisturizer- amazon NeuEve Silk , NeuEve Silver for menopausal or over 65 (if have severe vaginal atrophy or cancer treatments use NeuEve Silk for  1 month than move to [employer])- [employer], [place].com Olive and Bee intimate cream- [email or web address].com.au Mae vaginal moisturizer- Amazon Aloe Good Clean Love Hyaluronic acid Hyalofemme Reveree hyaluronic acid inserts   Creams to use externally on the Vulva area Marathon Oil (good for for cancer patients that had radiation to the area)- guam or [employer].[email or web address]://garcia-[email or web address]/ Vulva Balm/ V-magic cream by medicine mama- amazon Julva-amazon Vital V Wild Yam salve ( help moisturize and help with thinning vulvar area, does have Beeswax MoodMaid Botanical Pro-Meno Wild Yam Cream- Amazon Desert Harvest Gele Cleo by Sherrlyn labial moisturizer (Amazon),  aloe Good Clean Love Enchanted Rose by intimate rose  Things to avoid in the vaginal area Do not use things to irritate the vulvar area No lotions just specialized creams for the vulva area- Neogyn, V-magic,  No soaps; can use Aveeno or Calendula cleanser, unscented Dove if needed. Must be gentle No deodorants No douches Good to sleep without underwear to let the vaginal area to air out No scrubbing: spread the lips to let  warm water rinse over labias and pat dry   [facility] [address], [address] [place], [place] [address] Phone # [phone] Fax [phone]

## 2024-02-09 NOTE — Therapy (Signed)
 OUTPATIENT PHYSICAL THERAPY FEMALE PELVIC TREATMENT   Patient Name: Kristine Curtis MRN: [id number] DOB:1960/06/09, 63 y.o., female Today's Date: 02/09/2024  END OF SESSION:  PT End of Session - 02/09/24 1234     Visit Number 2    Date for Recertification  04/14/24    Authorization Type [employer] medicare    Authorization Time Period 01/21/2024 - 04/14/2024    Authorization - Visit Number 2    Authorization - Number of Visits 12    Progress Note Due on Visit 10    PT Start Time 1230    PT Stop Time 1310    PT Time Calculation (min) 40 min    Activity Tolerance Patient tolerated treatment well    Behavior During Therapy [facility] for tasks assessed/performed          Past Medical History:  Diagnosis Date   Allergic rhinitis due to pollen    Anemia    Anxiety    Asthma    Carpal tunnel syndrome of right wrist 12/22/2017   Cataract    Chronic combined systolic and diastolic CHF (congestive heart failure) (HCC)    06/2016 Echo: EF 40-45%, Gr1 DD   Chronic fatigue    Depression    Fall    Family history of polycystic kidney with negative CT 11/16/2012   Fibromyalgia    GERD (gastroesophageal reflux disease)    Helicobacter pylori gastritis 05/2020   Hypertension    Hypothyroidism    IBS (irritable bowel syndrome)    Internal hemorrhoids    Lymphocytic colitis    Morbid obesity (HCC)    NICM (nonischemic cardiomyopathy) (HCC)    a. 1999 Cath: nl cors;  b. EF prev as low as 35%;  c. 11/2015 Echo: Ef 40-45%;  d. 06/2016 Echo: EF 40-45%;  e. Lexiscan  MV: fixed anterosepta/inferseptal defect w/o ischemia, EF 35%.   Osteoarthritis    PONV (postoperative nausea and vomiting)    PVC (premature ventricular contraction)    Restless leg syndrome    Sjogren's syndrome    Vertigo    Past Surgical History:  Procedure Laterality Date   ABDOMINAL HERNIA REPAIR     ABDOMINAL HYSTERECTOMY     CESAREAN SECTION     CHOLECYSTECTOMY  10/2008   COLONOSCOPY  02/14/2015    ESOPHAGOGASTRODUODENOSCOPY     EYE SURGERY     2 2013 and 1981/DCR of right eye 05/2014   LUMBAR DISC SURGERY  2016   L3-4   RIGHT/LEFT HEART CATH AND CORONARY ANGIOGRAPHY N/A 07/11/2016   Procedure: Right/Left Heart Cath and Coronary Angiography;  Surgeon: Peter M Swaz[place], MD;  Location: [facility] INVASIVE CV LAB;  Service: Cardiovascular;  Laterality: N/A;   SHOULDER ARTHROSCOPY W/ SUBACROMIAL DECOMPRESSION AND DISTAL CLAVICLE EXCISION Right    SPINE SURGERY     C6-C7, 03/2017   TUBAL LIGATION  1992   Patient Active Problem List   Diagnosis Date Noted   Constipation 11/21/2023   Helicobacter pylori gastritis 05/15/2021   Hormone replacement therapy (HRT) 07/29/2019   IBS (irritable bowel syndrome) 11/10/2017   B12 deficiency 11/10/2017   OSA (obstructive sleep apnea) 11/10/2017   Myalgia due to statin 11/10/2017   Lumbar disc disease 10/01/2017   RLS (restless legs syndrome) 10/01/2017   Myofascial pain dysfunction syndrome 07/14/2017   Cervical radiculopathy 07/14/2017   Degenerative arthritis of left knee, XRAY12/2018 07/13/2017   Morbid obesity (HCC) 02/24/2017   Sjogren's disease 02/24/2017   Fibromyalgia 01/21/2017   Chronic low back pain with sciatica  05/02/2015   Nonischemic cardiomyopathy (HCC) 03/22/2015   Abdominal pain 11/05/2013   Vitamin D  deficiency 10/09/2009   Depression with anxiety 08/31/2008   Essential hypertension 08/31/2008   Allergic rhinitis 08/31/2008   Moderate persistent asthma 08/31/2008   Esophageal reflux 08/31/2008   Acquired hypothyroidism 08/30/2008   Classical migraine without intractable migraine 08/30/2008    PCP: Hughie Sharper, MD  REFERRING PROVIDER: Cayetano Harlene BIRCH, PA-C   REFERRING DIAG: K59.00 (ICD-10-CM) - Constipation, unspecified constipation type   THERAPY DIAG:  Muscle weakness (generalized)  Cramp and spasm  Pelvic pain  Rationale for Evaluation and Treatment: Rehabilitation  ONSET DATE: 10/12/23  SUBJECTIVE:                                                                                                                                                                                            SUBJECTIVE STATEMENT: I have been making progress. I am on the 4th dilator now.  Patient has had IBS for many years. She feel from the second step on the porch and straight back and landed on her gluteals lumbar area and fractured her T12 and is when severe constipation has started. Patient tried vaginal estrogen but did not help her. She had pelvic physical therapy but no progression  with dilators.  Fluid intake: water, soda  FUNCTIONAL LIMITATIONS: none  PERTINENT HISTORY:  Medications for current condition: Estradiol; Linzess ;  Surgeries: Abdominal hernia repair; Abdominal hysterectomy; cesarean section;  Other: Chronic fatigue,; Fibromyalgia; hypothyroidism; IBS; Lymphocytic colitis; Sjogren's syndrome; Cholecystectomy; Lumbar disc surgery;  Sexual abuse: No  PAIN:  Are you having pain? No  PRECAUTIONS: None  RED FLAGS: None   WEIGHT BEARING RESTRICTIONS: No  FALLS:  Has patient fallen in last 6 months? Yes. Number of falls 1 off her step but not due to balance  OCCUPATION: disability  ACTIVITY LEVEL : active, does weights, working on walking, flexibility exercise  PLOF: Independent  PATIENT GOALS: reduce leakage, improve constipation, improve pelvic floor health   BOWEL MOVEMENT: Pain with bowel movement: No, since she has been on the Linzess  Type of bowel movement:Type (Bristol Stool Scale) Type 5 and 6 with Linzess , Frequency 1-3 days, Strain no , and Splinting no Fully empty rectum: Yes: with linzess  Leakage: No                                                     Fiber supplement/laxative No  URINATION: Pain with urination: No Fully empty bladder:  No; has to lean forward and move around and make a conscious effort                Post-void dribble: when she does not take her time Stream:  Strong at first then is weak Urgency: Yes  Frequency:during the day 10-12                                Nocturia: Yes: 1    Leakage: Coughing, Sneezing, Laughing, and hold urine too long and bend over Pads/briefs: Yes: if she is going to be someplace without a bathroom then wears a pad  INTERCOURSE:  Ability to have vaginal penetration No  pain level was 10/10 last time she tried so has stopped.  Pain with intercourse: Initial Penetration, During Penetration, and Deep Penetration Dryness: Yes , stopped intercourse due to vaginal dryness Marinoff Scale: 3/3 Lubricant:yes  PREGNANCY: C-section deliveries 2   PROLAPSE: Pressure and Bulge   OBJECTIVE:  Note: Objective measures were completed at Evaluation unless otherwise noted.  DIAGNOSTIC FINDINGS:  none  PATIENT SURVEYS:  PFIQ-7: 90 UIQ-7: 43 POPIQ-7; 48  COGNITION: Overall cognitive status: Within functional limits for tasks assessed     SENSATION: Light touch: Appears intact    FUNCTIONAL TESTS:  Single leg stance:  Rt:3 sec  Lt:2 sec Squat:able to squat   POSTURE: rounded shoulders, forward head, increased thoracic kyphosis, and posterior pelvic tilt   LUMBARAROM/PROM:  A/PROM A/PROM  Eval (% available)  Flexion full  Extension Decreased by 50%  Right lateral flexion Decreased by 25%  Left lateral flexion Decreased by 25%  Right rotation Decreased by 25%  Left rotation Decreased by 25%   (Blank rows = not tested)  LOWER EXTREMITY ROM:  Passive ROM Right eval Left eval  Hip internal rotation 0 0   (Blank rows = not tested)  PALPATION:   Pelvic Alignment: left ASIS is higher  Abdominal: rib cage angle greater than 90 degrees; lifts rib cage to contract abdominal  Diastasis: No Distortion: Yes , increased upper abdominal girth compared to lower  Breathing: chest breather Scar tissue: Yes: cesarean restricted                External Perineal Exam: introitus was small, pink coloring                              Internal Pelvic Floor: tenderness throughout the vaginal canal not able to assess the pelvic floor rectally  Patient confirms identification and approves PT to assess internal pelvic floor and treatment Yes No emotional/communication barriers or cognitive limitation. Patient is motivated to learn. Patient understands and agrees with treatment goals and plan. PT explains patient will be examined in standing, sitting, and lying down to see how their muscles and joints work. When they are ready, they will be asked to remove their underwear so PT can examine their perineum. The patient is also given the option of providing their own chaperone as one is not provided in our facility. The patient also has the right and is explained the right to defer or refuse any part of the evaluation or treatment including the internal exam. With the patient's consent, PT will use one gloved finger to gently assess the muscles of the pelvic floor, seeing how well it contracts and relaxes and if there is muscle symmetry. After, the patient will get  dressed and PT and patient will discuss exam findings and plan of care. PT and patient discuss plan of care, schedule, attendance policy and HEP activities.   PELVIC MMT:   MMT eval  Vaginal 2/5  Internal Anal Sphincter Not able to assess due to pain in the rectum with finger in  External Anal Sphincter Not able to assess due to pain in the rectum with finger in  Puborectalis Not able to assess due to pain in the rectum with finger in  Diastasis Recti none  (Blank rows = not tested)        TONE: Average tone  PROLAPSE: Not able to assess due to small vaginal opening  TODAY'S TREATMENT:      02/09/24 Manual: Soft tissue mobilization: Circular massage to the abdomen to promote peristalic motion of the intestines  Manual work to the diaphragm Tissue rolling of the abdomen skin and along the lower rib cage.  Self-care: Educated patient on how to  use the extender on the dilator so she does have to reach as far and looked at her lubricant she is using.  Educated patient on how to use the vaginal wand on the pelvic floor muscles, along the perineal body and labia and vulva Educated patient on vaginal moisturizers around the vulva, along the rectum, and to the urethra to reduce the dryness.                                                                                                                             DATE: 01/21/24  EVAL Examination completed, findings reviewed, pt educated on POC, HEP, and female pelvic floor anatomy, reasoning with pelvic floor assessment internally with pt consent. Pt motivated to participate in PT and agreeable to attempt recommendations.     PATIENT EDUCATION:  Education details: indings reviewed, pt educated on POC, HEP, and female pelvic floor anatomy, reasoning with pelvic floor assessment internally with pt consent Person educated: Patient Education method: Explanation, Demonstration, Tactile cues, Verbal cues, and Handouts Education comprehension: verbalized understanding, returned demonstration, verbal cues required, tactile cues required, and needs further education  HOME EXERCISE PROGRAM: 02/09/24 Access Code: [email or web address] URL: [email or web address] Date: 02/09/2024 Prepared by: Channing Pereyra  Patient Education - Abdominal Massage for Constipation - Abdominal Massage for Constipation   ASSESSMENT:  CLINICAL IMPRESSION: Patient is a 63 y.o. female who was seen today for physical therapy treatment for constipation. Patient is on the 4th dilator now. She has increased softness of the abdominal tissue after the manual work. She understands how to use the vaginal trainer and vaginal wand on the pelvic floor. Patient understands why she should use vaginal moisturizers to improve the health of the tissue.  Patient will benefit from skilled therapy to improve pelvic floor coordination and  reduce pain and urinary leakage.   OBJECTIVE IMPAIRMENTS: decreased activity tolerance, decreased coordination, decreased strength, increased fascial restrictions, increased muscle spasms, and pain.   ACTIVITY LIMITATIONS: bending, continence, and toileting  PARTICIPATION LIMITATIONS: shopping and community activity  PERSONAL FACTORS: 1-2 comorbidities: Abdominal hernia repair; Abdominal hysterectomy; cesarean section;  are also affecting patient's functional outcome.   REHAB POTENTIAL: Good  CLINICAL DECISION MAKING: Evolving/moderate complexity  EVALUATION COMPLEXITY: Moderate   GOALS: Goals reviewed with patient? Yes  SHORT TERM GOALS: Target date: 02/17/24  Patient independent with initial HEP for core and pelvic floor strength.  Baseline: Goal status: INITIAL  2.  Patient educated on how to have a bowel movement with correct breathing to relax the pelvic floor and generate force to push the stool out.  Baseline:  Goal status: INITIAL  3.  Patient educated on vaginal moisturizers to improve the health of the tissue due to the Sjogren's and lack of estrogen.  Baseline:  Goal status: Met 02/09/24   LONG TERM GOALS: Target date: 04/14/24  Patient independent with advanced HEP for core and pelvic floor strength to improve continence.  Baseline:  Goal status: INITIAL  2.  Patient is able to quickly contract her pelvic floor to stop urinary leakage when she sneezes, and laugh.  Baseline:  Goal status: INITIAL  3.  Patient is able to bend over with a full bladder and engage the core and pelvic floor correctly to prevent urine from leaking.  Baseline:  Goal status: INITIAL  4.  Patient is able to place the largest dilator into the vaginal canal so she is able to have vaginal penetration and vaginal exam.  Baseline:  Goal status: INITIAL    PLAN:  PT FREQUENCY: 1-2x/week  PT DURATION: 12 weeks  PLANNED INTERVENTIONS: 97110-Therapeutic exercises, 97530-  Therapeutic activity, 97112- Neuromuscular re-education, 97535- Self Care, [id number]- Manual therapy, [id number]- Electrical stimulation (unattended), 20560 (1-2 muscles), 20561 (3+ muscles)- Dry Needling, Patient/Family education, Cryotherapy, Moist heat, and Biofeedback  PLAN FOR NEXT SESSION:  hip stretches, core engagement, diaphragmatic breathing to work on having a bowel movement   Channing Pereyra, PT 02/09/24 1:59 PM

## 2024-02-17 ENCOUNTER — Ambulatory Visit
Admission: RE | Admit: 2024-02-17 | Discharge: 2024-02-17 | Disposition: A | Discharge status: Home / Self Care | Source: Ambulatory Visit | Attending: Obstetrics and Gynecology | Admitting: Obstetrics and Gynecology

## 2024-02-17 DIAGNOSIS — Z0000 Encounter for general adult medical examination without abnormal findings: Secondary | ICD-10-CM

## 2024-02-18 ENCOUNTER — Ambulatory Visit: Payer: Self-pay | Admitting: Physical Therapy

## 2024-02-18 ENCOUNTER — Encounter: Payer: Self-pay | Admitting: Physical Therapy

## 2024-02-18 DIAGNOSIS — R252 Cramp and spasm: Secondary | ICD-10-CM

## 2024-02-18 DIAGNOSIS — M6281 Muscle weakness (generalized): Secondary | ICD-10-CM | POA: Diagnosis not present

## 2024-02-18 DIAGNOSIS — R102 Pelvic and perineal pain unspecified side: Secondary | ICD-10-CM

## 2024-02-18 NOTE — Patient Instructions (Signed)
 How To Poop Better:  What are Good Poops? There is no one exact normal, but they should be REGULAR.  This varies from person to person and ranges from up to 3x/day or as little as 3-4/week.  This should stay consistent for you.  They should be formed and ideally one solid mass that doesn't fall apart or dissolve in the water and is Reitz in color.  Lifestyle Tips:  Fiber: Eat 25-31 grams per day Do not hold it.  If you need to go, GO! Try to go every day around the same time Walk and move more Probiotics for more healthy gut bacteria Water and fluids: half of your healthy body weight in ounces  Toileting Tips:  Posture: knees above hips, back flat, look straight ahead, RELAX Relax all the muscles from your face down to your toes Breathe: slow deep breaths into your belly and pelvic floor is RELAXED Blow: Tighten belly and blow like blowing up a balloon, make "SH" sound, make a vowel sound with a deep voice Do NOT sit more than 10 minutes After you are finished, tighten the muscles to reset pelvic floor back to normal      [facility] [address] [place], [place] [address] Phone # [phone] Fax [phone]

## 2024-02-18 NOTE — Therapy (Unsigned)
 OUTPATIENT PHYSICAL THERAPY FEMALE PELVIC TREATMENT   Patient Name: Kristine Curtis MRN: [id number] DOB:03-20-1961, 63 y.o., female Today's Date: 02/18/2024  END OF SESSION:  PT End of Session - 02/18/24 1615     Visit Number 3    Date for Recertification  04/14/24    Authorization Type [employer] medicare    Authorization Time Period 01/21/2024 - 04/14/2024    Authorization - Visit Number 3    Authorization - Number of Visits 12    Progress Note Due on Visit 10    PT Start Time 1615    PT Stop Time 1653    PT Time Calculation (min) 38 min    Activity Tolerance Patient tolerated treatment well    Behavior During Therapy [facility] for tasks assessed/performed          Past Medical History:  Diagnosis Date   Allergic rhinitis due to pollen    Anemia    Anxiety    Asthma    Carpal tunnel syndrome of right wrist 12/22/2017   Cataract    Chronic combined systolic and diastolic CHF (congestive heart failure) (HCC)    06/2016 Echo: EF 40-45%, Gr1 DD   Chronic fatigue    Depression    Fall    Family history of polycystic kidney with negative CT 11/16/2012   Fibromyalgia    GERD (gastroesophageal reflux disease)    Helicobacter pylori gastritis 05/2020   Hypertension    Hypothyroidism    IBS (irritable bowel syndrome)    Internal hemorrhoids    Lymphocytic colitis    Morbid obesity (HCC)    NICM (nonischemic cardiomyopathy) (HCC)    a. 1999 Cath: nl cors;  b. EF prev as low as 35%;  c. 11/2015 Echo: Ef 40-45%;  d. 06/2016 Echo: EF 40-45%;  e. Lexiscan  MV: fixed anterosepta/inferseptal defect w/o ischemia, EF 35%.   Osteoarthritis    PONV (postoperative nausea and vomiting)    PVC (premature ventricular contraction)    Restless leg syndrome    Sjogren's syndrome    Vertigo    Past Surgical History:  Procedure Laterality Date   ABDOMINAL HERNIA REPAIR     ABDOMINAL HYSTERECTOMY     CESAREAN SECTION     CHOLECYSTECTOMY  10/2008   COLONOSCOPY  02/14/2015    ESOPHAGOGASTRODUODENOSCOPY     EYE SURGERY     2 2013 and 1981/DCR of right eye 05/2014   LUMBAR DISC SURGERY  2016   L3-4   RIGHT/LEFT HEART CATH AND CORONARY ANGIOGRAPHY N/A 07/11/2016   Procedure: Right/Left Heart Cath and Coronary Angiography;  Surgeon: Peter M Sw[place], MD;  Location: [facility] INVASIVE CV LAB;  Service: Cardiovascular;  Laterality: N/A;   SHOULDER ARTHROSCOPY W/ SUBACROMIAL DECOMPRESSION AND DISTAL CLAVICLE EXCISION Right    SPINE SURGERY     C6-C7, 03/2017   TUBAL LIGATION  1992   Patient Active Problem List   Diagnosis Date Noted   Constipation 11/21/2023   Helicobacter pylori gastritis 05/15/2021   Hormone replacement therapy (HRT) 07/29/2019   IBS (irritable bowel syndrome) 11/10/2017   B12 deficiency 11/10/2017   OSA (obstructive sleep apnea) 11/10/2017   Myalgia due to statin 11/10/2017   Lumbar disc disease 10/01/2017   RLS (restless legs syndrome) 10/01/2017   Myofascial pain dysfunction syndrome 07/14/2017   Cervical radiculopathy 07/14/2017   Degenerative arthritis of left knee, XRAY12/2018 07/13/2017   Morbid obesity (HCC) 02/24/2017   Sjogren's disease 02/24/2017   Fibromyalgia 01/21/2017   Chronic low back pain with sciatica  05/02/2015   Nonischemic cardiomyopathy (HCC) 03/22/2015   Abdominal pain 11/05/2013   Vitamin D  deficiency 10/09/2009   Depression with anxiety 08/31/2008   Essential hypertension 08/31/2008   Allergic rhinitis 08/31/2008   Moderate persistent asthma 08/31/2008   Esophageal reflux 08/31/2008   Acquired hypothyroidism 08/30/2008   Classical migraine without intractable migraine 08/30/2008    PCP: Hughie Sharper, MD  REFERRING PROVIDER: Cayetano Harlene BIRCH, PA-C   REFERRING DIAG: K59.00 (ICD-10-CM) - Constipation, unspecified constipation type   THERAPY DIAG:  Muscle weakness (generalized)  Cramp and spasm  Pelvic pain  Rationale for Evaluation and Treatment: Rehabilitation  ONSET DATE: 10/12/23  SUBJECTIVE:                                                                                                                                                                                            SUBJECTIVE STATEMENT: Abdominal massage has helped with bowel movements. I am on the 4th dilator now.  Patient has had IBS for many years. She feel from the second step on the porch and straight back and landed on her gluteals lumbar area and fractured her T12 and is when severe constipation has started. Patient tried vaginal estrogen but did not help her. She had pelvic physical therapy but no progression  with dilators.  Fluid intake: water, soda  FUNCTIONAL LIMITATIONS: none  PERTINENT HISTORY:  Medications for current condition: Estradiol; Linzess ;  Surgeries: Abdominal hernia repair; Abdominal hysterectomy; cesarean section;  Other: Chronic fatigue,; Fibromyalgia; hypothyroidism; IBS; Lymphocytic colitis; Sjogren's syndrome; Cholecystectomy; Lumbar disc surgery;  Sexual abuse: No  PAIN:  Are you having pain? No  PRECAUTIONS: None  RED FLAGS: None   WEIGHT BEARING RESTRICTIONS: No  FALLS:  Has patient fallen in last 6 months? Yes. Number of falls 1 off her step but not due to balance  OCCUPATION: disability  ACTIVITY LEVEL : active, does weights, working on walking, flexibility exercise  PLOF: Independent  PATIENT GOALS: reduce leakage, improve constipation, improve pelvic floor health   BOWEL MOVEMENT: Pain with bowel movement: No, since she has been on the Linzess  Type of bowel movement:Type (Bristol Stool Scale) Type 5 and 6 with Linzess , Frequency 1-3 days, Strain no , and Splinting no Fully empty rectum: Yes: with linzess  Leakage: No                                                     Fiber supplement/laxative No  URINATION: Pain with urination: No Fully  empty bladder: No; has to lean forward and move around and make a conscious effort                Post-void dribble: when she does not  take her time Stream: Strong at first then is weak Urgency: Yes  Frequency:during the day 10-12                                Nocturia: Yes: 1    Leakage: Coughing, Sneezing, Laughing, and hold urine too long and bend over Pads/briefs: Yes: if she is going to be someplace without a bathroom then wears a pad  INTERCOURSE:  Ability to have vaginal penetration No  pain level was 10/10 last time she tried so has stopped.  Pain with intercourse: Initial Penetration, During Penetration, and Deep Penetration Dryness: Yes , stopped intercourse due to vaginal dryness Marinoff Scale: 3/3 Lubricant:yes  PREGNANCY: C-section deliveries 2   PROLAPSE: Pressure and Bulge   OBJECTIVE:  Note: Objective measures were completed at Evaluation unless otherwise noted.  DIAGNOSTIC FINDINGS:  none  PATIENT SURVEYS:  PFIQ-7: 90 UIQ-7: 43 POPIQ-7; 48  COGNITION: Overall cognitive status: Within functional limits for tasks assessed     SENSATION: Light touch: Appears intact    FUNCTIONAL TESTS:  Single leg stance:  Rt:3 sec  Lt:2 sec Squat:able to squat   POSTURE: rounded shoulders, forward head, increased thoracic kyphosis, and posterior pelvic tilt   LUMBARAROM/PROM:  A/PROM A/PROM  Eval (% available)  Flexion full  Extension Decreased by 50%  Right lateral flexion Decreased by 25%  Left lateral flexion Decreased by 25%  Right rotation Decreased by 25%  Left rotation Decreased by 25%   (Blank rows = not tested)  LOWER EXTREMITY ROM:  Passive ROM Right eval Left eval  Hip internal rotation 0 0   (Blank rows = not tested)  PALPATION:   Pelvic Alignment: left ASIS is higher  Abdominal: rib cage angle greater than 90 degrees; lifts rib cage to contract abdominal  Diastasis: No Distortion: Yes , increased upper abdominal girth compared to lower  Breathing: chest breather Scar tissue: Yes: cesarean restricted                External Perineal Exam: introitus was  small, pink coloring                             Internal Pelvic Floor: tenderness throughout the vaginal canal not able to assess the pelvic floor rectally  Patient confirms identification and approves PT to assess internal pelvic floor and treatment Yes No emotional/communication barriers or cognitive limitation. Patient is motivated to learn. Patient understands and agrees with treatment goals and plan. PT explains patient will be examined in standing, sitting, and lying down to see how their muscles and joints work. When they are ready, they will be asked to remove their underwear so PT can examine their perineum. The patient is also given the option of providing their own chaperone as one is not provided in our facility. The patient also has the right and is explained the right to defer or refuse any part of the evaluation or treatment including the internal exam. With the patient's consent, PT will use one gloved finger to gently assess the muscles of the pelvic floor, seeing how well it contracts and relaxes and if there is muscle symmetry. After, the patient  will get dressed and PT and patient will discuss exam findings and plan of care. PT and patient discuss plan of care, schedule, attendance policy and HEP activities.   PELVIC MMT:   MMT eval  Vaginal 2/5  Internal Anal Sphincter Not able to assess due to pain in the rectum with finger in  External Anal Sphincter Not able to assess due to pain in the rectum with finger in  Puborectalis Not able to assess due to pain in the rectum with finger in  Diastasis Recti none  (Blank rows = not tested)        TONE: Average tone  PROLAPSE: Not able to assess due to small vaginal opening  TODAY'S TREATMENT:   02/18/24 Neuromuscular re-education: Core retraining: Transverse abdominus contraction in supine with tactile cues to engage the muscles Supine hip flexion isometric 10 x each side to contract the lower abdomen Supine hip diagonal  flexion 10 x each side to work the [employer] with pelvic floor contraction 10 x  Seated shoulder flexion with 1# wt in each hand Down training: Diaphragmatic breathing in supine Diaphragmatic breathing in sitting on a pillow to feel the pelvic floor relax Diaphragmatic breathing with feet on the squatty potty and feeling the pelvic floor relax Therapeutic activities: Functional strengthening activities: Educated patient on how to sit on a commode to relax her pelvic floor, breathing to push the stool out and knees above the hips. Patient was able to return demonstration.      02/09/24 Manual: Soft tissue mobilization: Circular massage to the abdomen to promote peristalic motion of the intestines  Manual work to the diaphragm Tissue rolling of the abdomen skin and along the lower rib cage.  Self-care: Educated patient on how to use the extender on the dilator so she does have to reach as far and looked at her lubricant she is using.  Educated patient on how to use the vaginal wand on the pelvic floor muscles, along the perineal body and labia and vulva Educated patient on vaginal moisturizers around the vulva, along the rectum, and to the urethra to reduce the dryness.                                                                                                                             DATE: 01/21/24  EVAL Examination completed, findings reviewed, pt educated on POC, HEP, and female pelvic floor anatomy, reasoning with pelvic floor assessment internally with pt consent. Pt motivated to participate in PT and agreeable to attempt recommendations.     PATIENT EDUCATION:  02/18/24 Education details: Access Code: [email or web address]; educated on how to have a bowel movement Person educated: Patient Education method: Explanation, Demonstration, Tactile cues, Verbal cues, and Handouts Education comprehension: verbalized understanding, returned demonstration, verbal cues required, tactile cues  required, and needs further education  HOME EXERCISE PROGRAM: 02/18/24 Access Code: [email or web address] URL: [email or web address] Date: 02/18/2024 Prepared by: Channing Pereyra  Exercises - Hooklying Transversus Abdominis Palpation  -  1 x daily - 7 x weekly - 1 sets - 10 reps - Hooklying Isometric Hip Flexion  - 1 x daily - 7 x weekly - 1 sets - 10 reps - Hooklying Isometric Hip Flexion with Opposite Arm  - 1 x daily - 7 x weekly - 1 sets - 10 reps - Supine Bridge  - 1 x daily - 7 x weekly - 1 sets - 10 reps - Seated Single Arm Shoulder Flexion with Dumbbell  - 1 x daily - 7 x weekly - 2 sets - 10 reps  Patient Education - Abdominal Massage for Constipation - Abdominal Massage for Constipation   ASSESSMENT:  CLINICAL IMPRESSION: Patient is a 63 y.o. female who was seen today for physical therapy treatment for constipation. Patient is on the 4th dilator now. Patient will leak urine with too much caffeine  and if hold urine too long.   Patient will have a bowel movement when she does the abdominal massage. Patient is learning how to engage the abdomen. She understands how to sit on the commode and relax her pelvic floor to have a bowel movement. Patient will benefit from skilled therapy to improve pelvic floor coordination and reduce pain and urinary leakage.   OBJECTIVE IMPAIRMENTS: decreased activity tolerance, decreased coordination, decreased strength, increased fascial restrictions, increased muscle spasms, and pain.   ACTIVITY LIMITATIONS: bending, continence, and toileting  PARTICIPATION LIMITATIONS: shopping and community activity  PERSONAL FACTORS: 1-2 comorbidities: Abdominal hernia repair; Abdominal hysterectomy; cesarean section;  are also affecting patient's functional outcome.   REHAB POTENTIAL: Good  CLINICAL DECISION MAKING: Evolving/moderate complexity  EVALUATION COMPLEXITY: Moderate   GOALS: Goals reviewed with patient? Yes  SHORT TERM GOALS: Target date:  02/17/24  Patient independent with initial HEP for core and pelvic floor strength.  Baseline: Goal status: Met 02/18/24  2.  Patient educated on how to have a bowel movement with correct breathing to relax the pelvic floor and generate force to push the stool out.  Baseline:  Goal status: Met 02/18/24  3.  Patient educated on vaginal moisturizers to improve the health of the tissue due to the Sjogren's and lack of estrogen.  Baseline:  Goal status: Met 02/09/24   LONG TERM GOALS: Target date: 04/14/24  Patient independent with advanced HEP for core and pelvic floor strength to improve continence.  Baseline:  Goal status: INITIAL  2.  Patient is able to quickly contract her pelvic floor to stop urinary leakage when she sneezes, and laugh.  Baseline:  Goal status: INITIAL  3.  Patient is able to bend over with a full bladder and engage the core and pelvic floor correctly to prevent urine from leaking.  Baseline:  Goal status: INITIAL  4.  Patient is able to place the largest dilator into the vaginal canal so she is able to have vaginal penetration and vaginal exam.  Baseline:  Goal status: INITIAL    PLAN:  PT FREQUENCY: 1-2x/week  PT DURATION: 12 weeks  PLANNED INTERVENTIONS: 97110-Therapeutic exercises, 97530- Therapeutic activity, 97112- Neuromuscular re-education, 97535- Self Care, [id number]- Manual therapy, [id number]- Electrical stimulation (unattended), 20560 (1-2 muscles), 20561 (3+ muscles)- Dry Needling, Patient/Family education, Cryotherapy, Moist heat, and Biofeedback  PLAN FOR NEXT SESSION:  hip stretches, core engagement; manual work to the pelvic floor  Channing Pereyra, PT 02/18/24 4:16 PM

## 2024-02-27 ENCOUNTER — Ambulatory Visit: Payer: Self-pay | Admitting: Physical Therapy

## 2024-02-27 ENCOUNTER — Encounter: Payer: Self-pay | Admitting: Physical Therapy

## 2024-02-27 DIAGNOSIS — R252 Cramp and spasm: Secondary | ICD-10-CM

## 2024-02-27 DIAGNOSIS — M6281 Muscle weakness (generalized): Secondary | ICD-10-CM | POA: Diagnosis not present

## 2024-02-27 DIAGNOSIS — R102 Pelvic and perineal pain unspecified side: Secondary | ICD-10-CM

## 2024-02-27 NOTE — Therapy (Signed)
 OUTPATIENT PHYSICAL THERAPY FEMALE PELVIC TREATMENT   Patient Name: Kristine Curtis MRN: [id number] DOB:[birth year], 63 y.o., female Today's Date: 02/27/2024  END OF SESSION:  PT End of Session - 02/27/24 1111     Visit Number 4    Date for Recertification  04/14/24    Authorization Type [employer] medicare    Authorization Time Period 01/21/2024 - 04/14/2024    Authorization - Visit Number 4    Authorization - Number of Visits 12    Progress Note Due on Visit 10    PT Start Time 1110    PT Stop Time 1148    PT Time Calculation (min) 38 min    Activity Tolerance Patient tolerated treatment well    Behavior During Therapy [facility] for tasks assessed/performed          Past Medical History:  Diagnosis Date   Allergic rhinitis due to pollen    Anemia    Anxiety    Asthma    Carpal tunnel syndrome of right wrist 12/22/2017   Cataract    Chronic combined systolic and diastolic CHF (congestive heart failure) (HCC)    06/2016 Echo: EF 40-45%, Gr1 DD   Chronic fatigue    Depression    Fall    Family history of polycystic kidney with negative CT 11/16/2012   Fibromyalgia    GERD (gastroesophageal reflux disease)    Helicobacter pylori gastritis 05/2020   Hypertension    Hypothyroidism    IBS (irritable bowel syndrome)    Internal hemorrhoids    Lymphocytic colitis    Morbid obesity (HCC)    NICM (nonischemic cardiomyopathy) (HCC)    a. 1999 Cath: nl cors;  b. EF prev as low as 35%;  c. 11/2015 Echo: Ef 40-45%;  d. 06/2016 Echo: EF 40-45%;  e. Lexiscan  MV: fixed anterosepta/inferseptal defect w/o ischemia, EF 35%.   Osteoarthritis    PONV (postoperative nausea and vomiting)    PVC (premature ventricular contraction)    Restless leg syndrome    Sjogren's syndrome    Vertigo    Past Surgical History:  Procedure Laterality Date   ABDOMINAL HERNIA REPAIR     ABDOMINAL HYSTERECTOMY     CESAREAN SECTION     CHOLECYSTECTOMY  10/2008   COLONOSCOPY  02/14/2015    ESOPHAGOGASTRODUODENOSCOPY     EYE SURGERY     2 2013 and 1981/DCR of right eye 05/2014   LUMBAR DISC SURGERY  2016   L3-4   RIGHT/LEFT HEART CATH AND CORONARY ANGIOGRAPHY N/A 07/11/2016   Procedure: Right/Left Heart Cath and Coronary Angiography;  Surgeon: Peter M Sw[place], MD;  Location: [facility] INVASIVE CV LAB;  Service: Cardiovascular;  Laterality: N/A;   SHOULDER ARTHROSCOPY W/ SUBACROMIAL DECOMPRESSION AND DISTAL CLAVICLE EXCISION Right    SPINE SURGERY     C6-C7, 03/2017   TUBAL LIGATION  1992   Patient Active Problem List   Diagnosis Date Noted   Constipation 11/21/2023   Helicobacter pylori gastritis 05/15/2021   Hormone replacement therapy (HRT) 07/29/2019   IBS (irritable bowel syndrome) 11/10/2017   B12 deficiency 11/10/2017   OSA (obstructive sleep apnea) 11/10/2017   Myalgia due to statin 11/10/2017   Lumbar disc disease 10/01/2017   RLS (restless legs syndrome) 10/01/2017   Myofascial pain dysfunction syndrome 07/14/2017   Cervical radiculopathy 07/14/2017   Degenerative arthritis of left knee, XRAY12/2018 07/13/2017   Morbid obesity (HCC) 02/24/2017   Sjogren's disease 02/24/2017   Fibromyalgia 01/21/2017   Chronic low back pain with sciatica  05/02/2015   Nonischemic cardiomyopathy (HCC) 03/22/2015   Abdominal pain 11/05/2013   Vitamin D  deficiency 10/09/2009   Depression with anxiety 08/31/2008   Essential hypertension 08/31/2008   Allergic rhinitis 08/31/2008   Moderate persistent asthma 08/31/2008   Esophageal reflux 08/31/2008   Acquired hypothyroidism 08/30/2008   Classical migraine without intractable migraine 08/30/2008    PCP: Hughie Sharper, MD  REFERRING PROVIDER: Cayetano Harlene BIRCH, PA-C   REFERRING DIAG: K59.00 (ICD-10-CM) - Constipation, unspecified constipation type   THERAPY DIAG:  Muscle weakness (generalized)  Cramp and spasm  Pelvic pain  Rationale for Evaluation and Treatment: Rehabilitation  ONSET DATE: 10/12/23  SUBJECTIVE:                                                                                                                                                                                            SUBJECTIVE STATEMENT: I have the estradial cream and tablets. I had a lot of stool come out. I am on the 5th dilator.  Doctor had trouble to perform rectal exam.   Fluid intake: water, soda  FUNCTIONAL LIMITATIONS: none  PERTINENT HISTORY:  Medications for current condition: Estradiol; Linzess ;  Surgeries: Abdominal hernia repair; Abdominal hysterectomy; cesarean section;  Other: Chronic fatigue,; Fibromyalgia; hypothyroidism; IBS; Lymphocytic colitis; Sjogren's syndrome; Cholecystectomy; Lumbar disc surgery;  Sexual abuse: No  PAIN:  Are you having pain? No  PRECAUTIONS: None  RED FLAGS: None   WEIGHT BEARING RESTRICTIONS: No  FALLS:  Has patient fallen in last 6 months? Yes. Number of falls 1 off her step but not due to balance  OCCUPATION: disability  ACTIVITY LEVEL : active, does weights, working on walking, flexibility exercise  PLOF: Independent  PATIENT GOALS: reduce leakage, improve constipation, improve pelvic floor health   BOWEL MOVEMENT: Pain with bowel movement: No, since she has been on the Linzess  Type of bowel movement:Type (Bristol Stool Scale) Type 5 and 6 with Linzess , Frequency 1-3 days, Strain no , and Splinting no Fully empty rectum: Yes: with linzess  Leakage: No                                                     Fiber supplement/laxative No  URINATION: Pain with urination: No Fully empty bladder: No; has to lean forward and move around and make a conscious effort                Post-void dribble: when she does not take her time Stream: Strong at  first then is weak Urgency: Yes  Frequency:during the day 10-12                                Nocturia: Yes: 1    Leakage: Coughing, Sneezing, Laughing, and hold urine too long and bend over Pads/briefs: Yes: if she is going to  be someplace without a bathroom then wears a pad  INTERCOURSE:  Ability to have vaginal penetration No  pain level was 10/10 last time she tried so has stopped.  Pain with intercourse: Initial Penetration, During Penetration, and Deep Penetration Dryness: Yes , stopped intercourse due to vaginal dryness Marinoff Scale: 3/3 Lubricant:yes  PREGNANCY: C-section deliveries 2   PROLAPSE: Pressure and Bulge   OBJECTIVE:  Note: Objective measures were completed at Evaluation unless otherwise noted.  DIAGNOSTIC FINDINGS:  none  PATIENT SURVEYS:  PFIQ-7: 90 UIQ-7: 43 POPIQ-7; 48  COGNITION: Overall cognitive status: Within functional limits for tasks assessed     SENSATION: Light touch: Appears intact    FUNCTIONAL TESTS:  Single leg stance:  Rt:3 sec  Lt:2 sec Squat:able to squat   POSTURE: rounded shoulders, forward head, increased thoracic kyphosis, and posterior pelvic tilt   LUMBARAROM/PROM:  A/PROM A/PROM  Eval (% available)  Flexion full  Extension Decreased by 50%  Right lateral flexion Decreased by 25%  Left lateral flexion Decreased by 25%  Right rotation Decreased by 25%  Left rotation Decreased by 25%   (Blank rows = not tested)  LOWER EXTREMITY ROM:  Passive ROM Right eval Left eval  Hip internal rotation 0 0   (Blank rows = not tested)  PALPATION:   Pelvic Alignment: left ASIS is higher  Abdominal: rib cage angle greater than 90 degrees; lifts rib cage to contract abdominal  Diastasis: No Distortion: Yes , increased upper abdominal girth compared to lower  Breathing: chest breather Scar tissue: Yes: cesarean restricted                External Perineal Exam: introitus was small, pink coloring                             Internal Pelvic Floor: tenderness throughout the vaginal canal not able to assess the pelvic floor rectally  Patient confirms identification and approves PT to assess internal pelvic floor and treatment Yes No  emotional/communication barriers or cognitive limitation. Patient is motivated to learn. Patient understands and agrees with treatment goals and plan. PT explains patient will be examined in standing, sitting, and lying down to see how their muscles and joints work. When they are ready, they will be asked to remove their underwear so PT can examine their perineum. The patient is also given the option of providing their own chaperone as one is not provided in our facility. The patient also has the right and is explained the right to defer or refuse any part of the evaluation or treatment including the internal exam. With the patient's consent, PT will use one gloved finger to gently assess the muscles of the pelvic floor, seeing how well it contracts and relaxes and if there is muscle symmetry. After, the patient will get dressed and PT and patient will discuss exam findings and plan of care. PT and patient discuss plan of care, schedule, attendance policy and HEP activities.   PELVIC MMT:   MMT eval  Vaginal 2/5  Internal Anal  Sphincter Not able to assess due to pain in the rectum with finger in  External Anal Sphincter Not able to assess due to pain in the rectum with finger in  Puborectalis Not able to assess due to pain in the rectum with finger in  Diastasis Recti none  (Blank rows = not tested)        TONE: Average tone  PROLAPSE: Not able to assess due to small vaginal opening  TODAY'S TREATMENT:   02/27/24 Manual: Soft tissue mobilization: Manual work around the rectum, external anal sphincter, levator ani, obturator internist, along the perineal body, coccygeus, anococcygeal ligament, ischiocavernosus to elongate the tissue and reduce the tenderness while patient laying on her side and all is external Exercises: Stretches/mobility: Single knee to chest holding 30 sec with slight internal rotation Piriformis stretch holding 30 sec Self-care: Discussed the estrogen cream she is not  using to improve the health of the vaginal tissue    02/18/24 Neuromuscular re-education: Core retraining: Transverse abdominus contraction in supine with tactile cues to engage the muscles Supine hip flexion isometric 10 x each side to contract the lower abdomen Supine hip diagonal flexion 10 x each side to work the [employer] with pelvic floor contraction 10 x  Seated shoulder flexion with 1# wt in each hand Down training: Diaphragmatic breathing in supine Diaphragmatic breathing in sitting on a pillow to feel the pelvic floor relax Diaphragmatic breathing with feet on the squatty potty and feeling the pelvic floor relax Therapeutic activities: Functional strengthening activities: Educated patient on how to sit on a commode to relax her pelvic floor, breathing to push the stool out and knees above the hips. Patient was able to return demonstration.      02/09/24 Manual: Soft tissue mobilization: Circular massage to the abdomen to promote peristalic motion of the intestines  Manual work to the diaphragm Tissue rolling of the abdomen skin and along the lower rib cage.  Self-care: Educated patient on how to use the extender on the dilator so she does have to reach as far and looked at her lubricant she is using.  Educated patient on how to use the vaginal wand on the pelvic floor muscles, along the perineal body and labia and vulva Educated patient on vaginal moisturizers around the vulva, along the rectum, and to the urethra to reduce the dryness.                                                                                                                             DATE: 01/21/24  EVAL Examination completed, findings reviewed, pt educated on POC, HEP, and female pelvic floor anatomy, reasoning with pelvic floor assessment internally with pt consent. Pt motivated to participate in PT and agreeable to attempt recommendations.     PATIENT EDUCATION:  02/18/24 Education  details: Access Code: [email or web address]; educated on how to have a bowel movement Person educated: Patient Education method: Explanation, Demonstration, Tactile cues, Verbal cues, and Handouts  Education comprehension: verbalized understanding, returned demonstration, verbal cues required, tactile cues required, and needs further education  HOME EXERCISE PROGRAM: 02/18/24 Access Code: [email or web address] URL: [email or web address] Date: 02/18/2024 Prepared by: Channing Pereyra  Exercises - Hooklying Transversus Abdominis Palpation  - 1 x daily - 7 x weekly - 1 sets - 10 reps - Hooklying Isometric Hip Flexion  - 1 x daily - 7 x weekly - 1 sets - 10 reps - Hooklying Isometric Hip Flexion with Opposite Arm  - 1 x daily - 7 x weekly - 1 sets - 10 reps - Supine Bridge  - 1 x daily - 7 x weekly - 1 sets - 10 reps - Seated Single Arm Shoulder Flexion with Dumbbell  - 1 x daily - 7 x weekly - 2 sets - 10 reps  Patient Education - Abdominal Massage for Constipation - Abdominal Massage for Constipation   ASSESSMENT:  CLINICAL IMPRESSION: Patient is a 63 y.o. female who was seen today for physical therapy treatment for constipation. Patient is on the 5th dilator now. Patient is now using the Estradial cream. Patient is able to hold the urine better to reduce leakage. She has had some increased urinary urgency.  Patient is going to see a therapist that will also be with sex. She had reduction of tension and pain in the pelvic floor tissue after therapy. Patient will benefit from skilled therapy to improve pelvic floor coordination and reduce pain and urinary leakage.   OBJECTIVE IMPAIRMENTS: decreased activity tolerance, decreased coordination, decreased strength, increased fascial restrictions, increased muscle spasms, and pain.   ACTIVITY LIMITATIONS: bending, continence, and toileting  PARTICIPATION LIMITATIONS: shopping and community activity  PERSONAL FACTORS: 1-2 comorbidities: Abdominal hernia  repair; Abdominal hysterectomy; cesarean section;  are also affecting patient's functional outcome.   REHAB POTENTIAL: Good  CLINICAL DECISION MAKING: Evolving/moderate complexity  EVALUATION COMPLEXITY: Moderate   GOALS: Goals reviewed with patient? Yes  SHORT TERM GOALS: Target date: 02/17/24  Patient independent with initial HEP for core and pelvic floor strength.  Baseline: Goal status: Met 02/18/24  2.  Patient educated on how to have a bowel movement with correct breathing to relax the pelvic floor and generate force to push the stool out.  Baseline:  Goal status: Met 02/18/24  3.  Patient educated on vaginal moisturizers to improve the health of the tissue due to the Sjogren's and lack of estrogen.  Baseline:  Goal status: Met 02/09/24   LONG TERM GOALS: Target date: 04/14/24  Patient independent with advanced HEP for core and pelvic floor strength to improve continence.  Baseline:  Goal status: INITIAL  2.  Patient is able to quickly contract her pelvic floor to stop urinary leakage when she sneezes, and laugh.  Baseline:  Goal status: INITIAL  3.  Patient is able to bend over with a full bladder and engage the core and pelvic floor correctly to prevent urine from leaking.  Baseline:  Goal status: INITIAL  4.  Patient is able to place the largest dilator into the vaginal canal so she is able to have vaginal penetration and vaginal exam.  Baseline:  Goal status: INITIAL    PLAN:  PT FREQUENCY: 1-2x/week  PT DURATION: 12 weeks  PLANNED INTERVENTIONS: 97110-Therapeutic exercises, 97530- Therapeutic activity, 97112- Neuromuscular re-education, 97535- Self Care, [id number]- Manual therapy, [id number]- Electrical stimulation (unattended), 20560 (1-2 muscles), 20561 (3+ muscles)- Dry Needling, Patient/Family education, Cryotherapy, Moist heat, and Biofeedback  PLAN FOR NEXT SESSION:  hip stretches, core engagement; manual work to the  pelvic floor internally  Channing Pereyra, PT 02/27/24 11:52 AM

## 2024-03-03 ENCOUNTER — Ambulatory Visit: Payer: Self-pay | Admitting: Physical Therapy

## 2024-03-03 ENCOUNTER — Encounter: Payer: Self-pay | Admitting: Physical Therapy

## 2024-03-03 DIAGNOSIS — M6281 Muscle weakness (generalized): Secondary | ICD-10-CM

## 2024-03-03 DIAGNOSIS — R102 Pelvic and perineal pain unspecified side: Secondary | ICD-10-CM

## 2024-03-03 DIAGNOSIS — R252 Cramp and spasm: Secondary | ICD-10-CM

## 2024-03-03 NOTE — Therapy (Signed)
 OUTPATIENT PHYSICAL THERAPY FEMALE PELVIC TREATMENT   Patient Name: Kristine Curtis MRN: [id number] DOB:06-20-1960, 63 y.o., female Today's Date: 03/03/2024  END OF SESSION:  PT End of Session - 03/03/24 0853     Visit Number 5    Date for Recertification  04/14/24    Authorization Type [employer] medicare    Authorization Time Period 01/21/2024 - 04/14/2024    Authorization - Visit Number 5    Authorization - Number of Visits 12    Progress Note Due on Visit 10    PT Start Time 0845    PT Stop Time 0925    PT Time Calculation (min) 40 min    Activity Tolerance Patient tolerated treatment well    Behavior During Therapy [facility] for tasks assessed/performed          Past Medical History:  Diagnosis Date   Allergic rhinitis due to pollen    Anemia    Anxiety    Asthma    Carpal tunnel syndrome of right wrist 12/22/2017   Cataract    Chronic combined systolic and diastolic CHF (congestive heart failure) (HCC)    06/2016 Echo: EF 40-45%, Gr1 DD   Chronic fatigue    Depression    Fall    Family history of polycystic kidney with negative CT 11/16/2012   Fibromyalgia    GERD (gastroesophageal reflux disease)    Helicobacter pylori gastritis 05/2020   Hypertension    Hypothyroidism    IBS (irritable bowel syndrome)    Internal hemorrhoids    Lymphocytic colitis    Morbid obesity (HCC)    NICM (nonischemic cardiomyopathy) (HCC)    a. 1999 Cath: nl cors;  b. EF prev as low as 35%;  c. 11/2015 Echo: Ef 40-45%;  d. 06/2016 Echo: EF 40-45%;  e. Lexiscan  MV: fixed anterosepta/inferseptal defect w/o ischemia, EF 35%.   Osteoarthritis    PONV (postoperative nausea and vomiting)    PVC (premature ventricular contraction)    Restless leg syndrome    Sjogren's syndrome    Vertigo    Past Surgical History:  Procedure Laterality Date   ABDOMINAL HERNIA REPAIR     ABDOMINAL HYSTERECTOMY     CESAREAN SECTION     CHOLECYSTECTOMY  10/2008   COLONOSCOPY  02/14/2015    ESOPHAGOGASTRODUODENOSCOPY     EYE SURGERY     2 2013 and 1981/DCR of right eye 05/2014   LUMBAR DISC SURGERY  2016   L3-4   RIGHT/LEFT HEART CATH AND CORONARY ANGIOGRAPHY N/A 07/11/2016   Procedure: Right/Left Heart Cath and Coronary Angiography;  Surgeon: Peter M Jordan, MD;  Location: [facility] INVASIVE CV LAB;  Service: Cardiovascular;  Laterality: N/A;   SHOULDER ARTHROSCOPY W/ SUBACROMIAL DECOMPRESSION AND DISTAL CLAVICLE EXCISION Right    SPINE SURGERY     C6-C7, 03/2017   TUBAL LIGATION  1992   Patient Active Problem List   Diagnosis Date Noted   Constipation 11/21/2023   Helicobacter pylori gastritis 05/15/2021   Hormone replacement therapy (HRT) 07/29/2019   IBS (irritable bowel syndrome) 11/10/2017   B12 deficiency 11/10/2017   OSA (obstructive sleep apnea) 11/10/2017   Myalgia due to statin 11/10/2017   Lumbar disc disease 10/01/2017   RLS (restless legs syndrome) 10/01/2017   Myofascial pain dysfunction syndrome 07/14/2017   Cervical radiculopathy 07/14/2017   Degenerative arthritis of left knee, XRAY12/2018 07/13/2017   Morbid obesity (HCC) 02/24/2017   Sjogren's disease 02/24/2017   Fibromyalgia 01/21/2017   Chronic low back pain with sciatica  05/02/2015   Nonischemic cardiomyopathy (HCC) 03/22/2015   Abdominal pain 11/05/2013   Vitamin D  deficiency 10/09/2009   Depression with anxiety 08/31/2008   Essential hypertension 08/31/2008   Allergic rhinitis 08/31/2008   Moderate persistent asthma 08/31/2008   Esophageal reflux 08/31/2008   Acquired hypothyroidism 08/30/2008   Classical migraine without intractable migraine 08/30/2008    PCP: Hughie Sharper, MD  REFERRING PROVIDER: Cayetano Harlene BIRCH, PA-C   REFERRING DIAG: K59.00 (ICD-10-CM) - Constipation, unspecified constipation type   THERAPY DIAG:  Muscle weakness (generalized)  Cramp and spasm  Pelvic pain  Rationale for Evaluation and Treatment: Rehabilitation  ONSET DATE: 10/12/23  SUBJECTIVE:                                                                                                                                                                                            SUBJECTIVE STATEMENT: I have still had some issues with constipation. The massage of the abdomen has helped with the bowel movements.  Fluid intake: water, soda  FUNCTIONAL LIMITATIONS: none  PERTINENT HISTORY:  Medications for current condition: Estradiol; Linzess ;  Surgeries: Abdominal hernia repair; Abdominal hysterectomy; cesarean section;  Other: Chronic fatigue,; Fibromyalgia; hypothyroidism; IBS; Lymphocytic colitis; Sjogren's syndrome; Cholecystectomy; Lumbar disc surgery;  Sexual abuse: No  PAIN:  Are you having pain? No  PRECAUTIONS: None  RED FLAGS: None   WEIGHT BEARING RESTRICTIONS: No  FALLS:  Has patient fallen in last 6 months? Yes. Number of falls 1 off her step but not due to balance  OCCUPATION: disability  ACTIVITY LEVEL : active, does weights, working on walking, flexibility exercise  PLOF: Independent  PATIENT GOALS: reduce leakage, improve constipation, improve pelvic floor health   BOWEL MOVEMENT: Pain with bowel movement: No, since she has been on the Linzess  Type of bowel movement:Type (Bristol Stool Scale) Type 5 and 6 with Linzess , Frequency 1-3 days, Strain no , and Splinting no Fully empty rectum: Yes: with linzess  Leakage: No                                                     Fiber supplement/laxative No  URINATION: Pain with urination: No Fully empty bladder: No; has to lean forward and move around and make a conscious effort                Post-void dribble: when she does not take her time Stream: Strong at first then is weak Urgency: Yes  Frequency:during the day 10-12  Nocturia: Yes: 1    Leakage: Coughing, Sneezing, Laughing, and hold urine too long and bend over Pads/briefs: Yes: if she is going to be someplace without a  bathroom then wears a pad  INTERCOURSE:  Ability to have vaginal penetration No  pain level was 10/10 last time she tried so has stopped.  Pain with intercourse: Initial Penetration, During Penetration, and Deep Penetration Dryness: Yes , stopped intercourse due to vaginal dryness Marinoff Scale: 3/3 Lubricant:yes  PREGNANCY: C-section deliveries 2   PROLAPSE: Pressure and Bulge   OBJECTIVE:  Note: Objective measures were completed at Evaluation unless otherwise noted.  DIAGNOSTIC FINDINGS:  none  PATIENT SURVEYS:  PFIQ-7: 90 UIQ-7: 43 POPIQ-7; 48  COGNITION: Overall cognitive status: Within functional limits for tasks assessed     SENSATION: Light touch: Appears intact    FUNCTIONAL TESTS:  Single leg stance:  Rt:3 sec  Lt:2 sec Squat:able to squat   POSTURE: rounded shoulders, forward head, increased thoracic kyphosis, and posterior pelvic tilt   LUMBARAROM/PROM:  A/PROM A/PROM  Eval (% available)  Flexion full  Extension Decreased by 50%  Right lateral flexion Decreased by 25%  Left lateral flexion Decreased by 25%  Right rotation Decreased by 25%  Left rotation Decreased by 25%   (Blank rows = not tested)  LOWER EXTREMITY ROM:  Passive ROM Right eval Left eval  Hip internal rotation 0 0   (Blank rows = not tested)  PALPATION:   Pelvic Alignment: left ASIS is higher  Abdominal: rib cage angle greater than 90 degrees; lifts rib cage to contract abdominal  Diastasis: No Distortion: Yes , increased upper abdominal girth compared to lower  Breathing: chest breather Scar tissue: Yes: cesarean restricted                External Perineal Exam: introitus was small, pink coloring                             Internal Pelvic Floor: tenderness throughout the vaginal canal not able to assess the pelvic floor rectally  Patient confirms identification and approves PT to assess internal pelvic floor and treatment Yes No emotional/communication  barriers or cognitive limitation. Patient is motivated to learn. Patient understands and agrees with treatment goals and plan. PT explains patient will be examined in standing, sitting, and lying down to see how their muscles and joints work. When they are ready, they will be asked to remove their underwear so PT can examine their perineum. The patient is also given the option of providing their own chaperone as one is not provided in our facility. The patient also has the right and is explained the right to defer or refuse any part of the evaluation or treatment including the internal exam. With the patient's consent, PT will use one gloved finger to gently assess the muscles of the pelvic floor, seeing how well it contracts and relaxes and if there is muscle symmetry. After, the patient will get dressed and PT and patient will discuss exam findings and plan of care. PT and patient discuss plan of care, schedule, attendance policy and HEP activities.   PELVIC MMT:   MMT eval  Vaginal 2/5  Internal Anal Sphincter Not able to assess due to pain in the rectum with finger in  External Anal Sphincter Not able to assess due to pain in the rectum with finger in  Puborectalis Not able to assess due to pain in the  rectum with finger in  Diastasis Recti none  (Blank rows = not tested)        TONE: Average tone  PROLAPSE: Not able to assess due to small vaginal opening  TODAY'S TREATMENT:   03/03/24 Manual: Myofascial release: Fascial release of the urogenital diaphragm and levator ani going through the restrictions externally Internal pelvic floor techniques: No emotional/communication barriers or cognitive limitation. Patient is motivated to learn. Patient understands and agrees with treatment goals and plan. PT explains patient will be examined in standing, sitting, and lying down to see how their muscles and joints work. When they are ready, they will be asked to remove their underwear so PT can  examine their perineum. The patient is also given the option of providing their own chaperone as one is not provided in our facility. The patient also has the right and is explained the right to defer or refuse any part of the evaluation or treatment including the internal exam. With the patient's consent, PT will use one gloved finger to gently assess the muscles of the pelvic floor, seeing how well it contracts and relaxes and if there is muscle symmetry. After, the patient will get dressed and PT and patient will discuss exam findings and plan of care. PT and patient discuss plan of care, schedule, attendance policy and HEP activities.  Therapist using her gloved finger working on the right side of the levator ani, obturator internist and along the introitus going very slowly monitoring for pain and anxiety Self-care: Educated patient to use the 4th dilator for 2 minutes then go to the 5th dilator. Reviewed with patient on moving dilator in different. Discussed with patient on how to incorporate her husband with using the dilator.  directions and moving her hips. Gave patient Gisele searles to try with dilators.     02/27/24 Manual: Soft tissue mobilization: Manual work around the rectum, external anal sphincter, levator ani, obturator internist, along the perineal body, coccygeus, anococcygeal ligament, ischiocavernosus to elongate the tissue and reduce the tenderness while patient laying on her side and all is external Exercises: Stretches/mobility: Single knee to chest holding 30 sec with slight internal rotation Piriformis stretch holding 30 sec Self-care: Discussed the estrogen cream she is not using to improve the health of the vaginal tissue    02/18/24 Neuromuscular re-education: Core retraining: Transverse abdominus contraction in supine with tactile cues to engage the muscles Supine hip flexion isometric 10 x each side to contract the lower abdomen Supine hip diagonal flexion 10 x each  side to work the [employer] with pelvic floor contraction 10 x  Seated shoulder flexion with 1# wt in each hand Down training: Diaphragmatic breathing in supine Diaphragmatic breathing in sitting on a pillow to feel the pelvic floor relax Diaphragmatic breathing with feet on the squatty potty and feeling the pelvic floor relax Therapeutic activities: Functional strengthening activities: Educated patient on how to sit on a commode to relax her pelvic floor, breathing to push the stool out and knees above the hips. Patient was able to return demonstration.     PATIENT EDUCATION:  02/18/24 Education details: Access Code: [email or web address]; educated on how to have a bowel movement Person educated: Patient Education method: Explanation, Demonstration, Tactile cues, Verbal cues, and Handouts Education comprehension: verbalized understanding, returned demonstration, verbal cues required, tactile cues required, and needs further education  HOME EXERCISE PROGRAM: 02/18/24 Access Code: [email or web address] URL: [email or web address] Date: 02/18/2024 Prepared by: Channing Pereyra  Exercises - Hooklying Transversus Abdominis Palpation  -  1 x daily - 7 x weekly - 1 sets - 10 reps - Hooklying Isometric Hip Flexion  - 1 x daily - 7 x weekly - 1 sets - 10 reps - Hooklying Isometric Hip Flexion with Opposite Arm  - 1 x daily - 7 x weekly - 1 sets - 10 reps - Supine Bridge  - 1 x daily - 7 x weekly - 1 sets - 10 reps - Seated Single Arm Shoulder Flexion with Dumbbell  - 1 x daily - 7 x weekly - 2 sets - 10 reps  Patient Education - Abdominal Massage for Constipation - Abdominal Massage for Constipation   ASSESSMENT:  CLINICAL IMPRESSION: Patient is a 63 y.o. female who was seen today for physical therapy treatment for constipation. Patient is on the 5th dilator now. Urinary leakage is 30% better. Sometimes will leak with coughing and sneezing compared to all of the time.  Patient was able to tolerate  the therapist working on the pelvic floor and comfortable with her doing the treatment. Patient understands how to use the dilator and introduce her husband with the dilator. Patient will benefit from skilled therapy to improve pelvic floor coordination and reduce pain and urinary leakage.   OBJECTIVE IMPAIRMENTS: decreased activity tolerance, decreased coordination, decreased strength, increased fascial restrictions, increased muscle spasms, and pain.   ACTIVITY LIMITATIONS: bending, continence, and toileting  PARTICIPATION LIMITATIONS: shopping and community activity  PERSONAL FACTORS: 1-2 comorbidities: Abdominal hernia repair; Abdominal hysterectomy; cesarean section;  are also affecting patient's functional outcome.   REHAB POTENTIAL: Good  CLINICAL DECISION MAKING: Evolving/moderate complexity  EVALUATION COMPLEXITY: Moderate   GOALS: Goals reviewed with patient? Yes  SHORT TERM GOALS: Target date: 02/17/24  Patient independent with initial HEP for core and pelvic floor strength.  Baseline: Goal status: Met 02/18/24  2.  Patient educated on how to have a bowel movement with correct breathing to relax the pelvic floor and generate force to push the stool out.  Baseline:  Goal status: Met 02/18/24  3.  Patient educated on vaginal moisturizers to improve the health of the tissue due to the Sjogren's and lack of estrogen.  Baseline:  Goal status: Met 02/09/24   LONG TERM GOALS: Target date: 04/14/24  Patient independent with advanced HEP for core and pelvic floor strength to improve continence.  Baseline:  Goal status: INITIAL  2.  Patient is able to quickly contract her pelvic floor to stop urinary leakage when she sneezes, and laugh.  Baseline:  Goal status: INITIAL  3.  Patient is able to bend over with a full bladder and engage the core and pelvic floor correctly to prevent urine from leaking.  Baseline:  Goal status: INITIAL  4.  Patient is able to place the  largest dilator into the vaginal canal so she is able to have vaginal penetration and vaginal exam.  Baseline:  Goal status: INITIAL    PLAN:  PT FREQUENCY: 1-2x/week  PT DURATION: 12 weeks  PLANNED INTERVENTIONS: 97110-Therapeutic exercises, 97530- Therapeutic activity, 97112- Neuromuscular re-education, 97535- Self Care, 02859- Manual therapy, [id number]- Electrical stimulation (unattended), 20560 (1-2 muscles), 20561 (3+ muscles)- Dry Needling, Patient/Family education, Cryotherapy, Moist heat, and Biofeedback  PLAN FOR NEXT SESSION:  manual work on the ovaries and left pelvic floor vaginally,  core engagement  Channing Pereyra, PT 03/03/24 9:31 AM

## 2024-03-08 ENCOUNTER — Ambulatory Visit: Admitting: Physical Therapy

## 2024-03-17 ENCOUNTER — Encounter: Payer: Self-pay | Admitting: Physical Therapy

## 2024-03-17 ENCOUNTER — Ambulatory Visit: Payer: Self-pay | Attending: Gastroenterology | Admitting: Physical Therapy

## 2024-03-17 DIAGNOSIS — M6281 Muscle weakness (generalized): Secondary | ICD-10-CM | POA: Insufficient documentation

## 2024-03-17 DIAGNOSIS — R102 Pelvic and perineal pain unspecified side: Secondary | ICD-10-CM | POA: Insufficient documentation

## 2024-03-17 DIAGNOSIS — R252 Cramp and spasm: Secondary | ICD-10-CM | POA: Insufficient documentation

## 2024-03-17 NOTE — Patient Instructions (Addendum)
 Moisturizers They are used in the vagina to hydrate the mucous membrane that make up the vaginal canal. Designed to keep a more normal acid balance (ph) Once placed in the vagina, it will last between two to three days.  Use 2-3 times per week at bedtime  Ingredients to avoid is glycerin and fragrance, can increase chance of infection Should not be used just before sex due to causing irritation Most are gels administered either in a tampon-shaped applicator or as a vaginal suppository. They are non-hormonal.   Types of Moisturizers(internal use)  Vitamin E vaginal suppositories- Whole foods, Amazon Moist Again Coconut oil- can break down condoms, any grocery store (prefer organic) Julva- (Do no use if taking  Tamoxifen) amazon Yes moisturizer- amazon NeuEve Silk , NeuEve Silver for menopausal or over 65 (if have severe vaginal atrophy or cancer treatments use NeuEve Silk for  1 month than move to [employer])- [employer], Annetta [place].com Olive and Bee intimate cream- [email or web address].com.au Mae vaginal moisturizer- Amazon Aloe Good Clean Love Hyaluronic acid Hyalofemme Reveree hyaluronic acid inserts   Creams to use externally on the Vulva area Marathon Oil (good for for cancer patients that had radiation to the area)- amazon or newell rubbermaid.[email or web address]/ Vulva Balm/ V-magic cream by medicine mama- amazon Julva-amazon Vital V Wild Yam salve ( help moisturize and help with thinning vulvar area, does have Beeswax MoodMaid Botanical Pro-Meno Wild Yam Cream- Amazon Desert Harvest Gele Cleo by Sherrlyn labial moisturizer (Amazon),  Coconut or olive oil aloe Good Clean Love Enchanted Rose by intimate rose  Things to avoid in the vaginal area Do not use things to irritate the vulvar area No lotions just specialized creams for the vulva area- Neogyn, V-magic,  No soaps; can use Aveeno or Calendula cleanser, unscented Dove if needed. Must be gentle No deodorants No douches Good to  sleep without underwear to let the vaginal area to air out No scrubbing: spread the lips to let warm water rinse over labias and pat dry    Urge Incontinence  Ideal urination frequency is every 2-4 wakeful hours, which equates to 5-8 times within a 24-hour period.   Urge incontinence is leakage that occurs when the bladder muscle contracts, creating a sudden need to go before getting to the bathroom.   Going too often when your bladder isn't actually full can disrupt the body's automatic signals to store and hold urine longer, which will increase urgency/frequency.  In this case, the bladder "is running the show" and strategies can be learned to retrain this pattern.   One should be able to control the first urge to urinate, at around .  The bladder can hold up to a "grande latte," or . To help you gain control, practice the Urge Drill below when urgency strikes.  This drill will help retrain your bladder signals and allow you to store and hold urine longer.  The overall goal is to stretch out your time between voids to reach a more manageable voiding schedule.    Practice your quick flicks often throughout the day (each waking hour) even when you don't need feel the urge to go.  This will help strengthen your pelvic floor muscles, making them more effective in controlling leakage.  Urge Drill  When you feel an urge to go, follow these steps to regain control: Stop what you are doing and be still Take one deep breath, directing your air into your abdomen Think an affirming thought, such as "I've got this." Do 5 quick flicks  of your pelvic floor Lift heels up and down with them hitting the ground hard Walk with control to the bathroom to void, or delay voiding If the urge comes again then repeat      [facility] [address], [address] [place], [place] [address] Phone # [phone] Fax [phone]

## 2024-03-17 NOTE — Therapy (Signed)
 OUTPATIENT PHYSICAL THERAPY FEMALE PELVIC TREATMENT   Patient Name: Kristine Curtis MRN: [id number] DOB:09-29-1960, 63 y.o., female Today's Date: 03/17/2024  END OF SESSION:  PT End of Session - 03/17/24 1018     Visit Number 6    Date for Recertification  04/14/24    Authorization Type [employer] medicare    Authorization Time Period 01/21/2024 - 04/14/2024    Authorization - Visit Number 6    Authorization - Number of Visits 12    Progress Note Due on Visit 10    PT Start Time 1015    PT Stop Time 1055    PT Time Calculation (min) 40 min    Activity Tolerance Patient tolerated treatment well    Behavior During Therapy [employer] for tasks assessed/performed          Past Medical History:  Diagnosis Date   Allergic rhinitis due to pollen    Anemia    Anxiety    Asthma    Carpal tunnel syndrome of right wrist 12/22/2017   Cataract    Chronic combined systolic and diastolic CHF (congestive heart failure) (HCC)    06/2016 Echo: EF 40-45%, Gr1 DD   Chronic fatigue    Depression    Fall    Family history of polycystic kidney with negative CT 11/16/2012   Fibromyalgia    GERD (gastroesophageal reflux disease)    Helicobacter pylori gastritis 05/2020   Hypertension    Hypothyroidism    IBS (irritable bowel syndrome)    Internal hemorrhoids    Lymphocytic colitis    Morbid obesity (HCC)    NICM (nonischemic cardiomyopathy) (HCC)    a. 1999 Cath: nl cors;  b. EF prev as low as 35%;  c. 11/2015 Echo: Ef 40-45%;  d. 06/2016 Echo: EF 40-45%;  e. Lexiscan  MV: fixed anterosepta/inferseptal defect w/o ischemia, EF 35%.   Osteoarthritis    PONV (postoperative nausea and vomiting)    PVC (premature ventricular contraction)    Restless leg syndrome    Sjogren's syndrome    Vertigo    Past Surgical History:  Procedure Laterality Date   ABDOMINAL HERNIA REPAIR     ABDOMINAL HYSTERECTOMY     CESAREAN SECTION     CHOLECYSTECTOMY  10/2008   COLONOSCOPY  02/14/2015    ESOPHAGOGASTRODUODENOSCOPY     EYE SURGERY     2 2013 and 1981/DCR of right eye 05/2014   LUMBAR DISC SURGERY  2016   L3-4   RIGHT/LEFT HEART CATH AND CORONARY ANGIOGRAPHY N/A 07/11/2016   Procedure: Right/Left Heart Cath and Coronary Angiography;  Surgeon: Peter M Jordan, MD;  Location: [facility] INVASIVE CV LAB;  Service: Cardiovascular;  Laterality: N/A;   SHOULDER ARTHROSCOPY W/ SUBACROMIAL DECOMPRESSION AND DISTAL CLAVICLE EXCISION Right    SPINE SURGERY     C6-C7, 03/2017   TUBAL LIGATION  1992   Patient Active Problem List   Diagnosis Date Noted   Constipation 11/21/2023   Helicobacter pylori gastritis 05/15/2021   Hormone replacement therapy (HRT) 07/29/2019   IBS (irritable bowel syndrome) 11/10/2017   B12 deficiency 11/10/2017   OSA (obstructive sleep apnea) 11/10/2017   Myalgia due to statin 11/10/2017   Lumbar disc disease 10/01/2017   RLS (restless legs syndrome) 10/01/2017   Myofascial pain dysfunction syndrome 07/14/2017   Cervical radiculopathy 07/14/2017   Degenerative arthritis of left knee, XRAY12/2018 07/13/2017   Morbid obesity (HCC) 02/24/2017   Sjogren's disease 02/24/2017   Fibromyalgia 01/21/2017   Chronic low back pain with sciatica  05/02/2015   Nonischemic cardiomyopathy (HCC) 03/22/2015   Abdominal pain 11/05/2013   Vitamin D  deficiency 10/09/2009   Depression with anxiety 08/31/2008   Essential hypertension 08/31/2008   Allergic rhinitis 08/31/2008   Moderate persistent asthma 08/31/2008   Esophageal reflux 08/31/2008   Acquired hypothyroidism 08/30/2008   Classical migraine without intractable migraine 08/30/2008    PCP: Hughie Sharper, MD  REFERRING PROVIDER: Cayetano Harlene BIRCH, PA-C   REFERRING DIAG: K59.00 (ICD-10-CM) - Constipation, unspecified constipation type   THERAPY DIAG:  Muscle weakness (generalized)  Cramp and spasm  Pelvic pain  Rationale for Evaluation and Treatment: Rehabilitation  ONSET DATE: 10/12/23  SUBJECTIVE:                                                                                                                                                                                            SUBJECTIVE STATEMENT: Patient had a lot of stress last week so she is in a flare. Patient is on the 5th dilator.   FUNCTIONAL LIMITATIONS: none  PERTINENT HISTORY:  Medications for current condition: Estradiol; Linzess ;  Surgeries: Abdominal hernia repair; Abdominal hysterectomy; cesarean section;  Other: Chronic fatigue,; Fibromyalgia; hypothyroidism; IBS; Lymphocytic colitis; Sjogren's syndrome; Cholecystectomy; Lumbar disc surgery;  Sexual abuse: No  PAIN:  Are you having pain? No  PRECAUTIONS: None  RED FLAGS: None   WEIGHT BEARING RESTRICTIONS: No  FALLS:  Has patient fallen in last 6 months? Yes. Number of falls 1 off her step but not due to balance  OCCUPATION: disability  ACTIVITY LEVEL : active, does weights, working on walking, flexibility exercise  PLOF: Independent  PATIENT GOALS: reduce leakage, improve constipation, improve pelvic floor health   BOWEL MOVEMENT: Pain with bowel movement: No, since she has been on the Linzess  Type of bowel movement:Type (Bristol Stool Scale) Type 5 and 6 with Linzess , Frequency 1-3 days, Strain no , and Splinting no Fully empty rectum: Yes: with linzess  Leakage: No                                                     Fiber supplement/laxative No  URINATION: Pain with urination: No Fully empty bladder: No; has to lean forward and move around and make a conscious effort                Post-void dribble: when she does not take her time Stream: Strong at first then is weak Urgency: Yes  Frequency:during the day 10-12  Nocturia: Yes: 1    Leakage: Coughing, Sneezing, Laughing, and hold urine too long and bend over Pads/briefs: Yes: if she is going to be someplace without a bathroom then wears a pad  INTERCOURSE:  Ability to  have vaginal penetration No  pain level was 10/10 last time she tried so has stopped.  Pain with intercourse: Initial Penetration, During Penetration, and Deep Penetration Dryness: Yes , stopped intercourse due to vaginal dryness Marinoff Scale: 3/3 Lubricant:yes  PREGNANCY: C-section deliveries 2   PROLAPSE: Pressure and Bulge   OBJECTIVE:  Note: Objective measures were completed at Evaluation unless otherwise noted.  DIAGNOSTIC FINDINGS:  none  PATIENT SURVEYS:  PFIQ-7: 90 UIQ-7: 43 POPIQ-7; 48  COGNITION: Overall cognitive status: Within functional limits for tasks assessed     SENSATION: Light touch: Appears intact    FUNCTIONAL TESTS:  Single leg stance:  Rt:3 sec  Lt:2 sec Squat:able to squat   POSTURE: rounded shoulders, forward head, increased thoracic kyphosis, and posterior pelvic tilt   LUMBARAROM/PROM:  A/PROM A/PROM  Eval (% available)  Flexion full  Extension Decreased by 50%  Right lateral flexion Decreased by 25%  Left lateral flexion Decreased by 25%  Right rotation Decreased by 25%  Left rotation Decreased by 25%   (Blank rows = not tested)  LOWER EXTREMITY ROM:  Passive ROM Right eval Left eval  Hip internal rotation 0 0   (Blank rows = not tested)  PALPATION:   Pelvic Alignment: left ASIS is higher  Abdominal: rib cage angle greater than 90 degrees; lifts rib cage to contract abdominal  Diastasis: No Distortion: Yes , increased upper abdominal girth compared to lower  Breathing: chest breather Scar tissue: Yes: cesarean restricted                External Perineal Exam: introitus was small, pink coloring                             Internal Pelvic Floor: tenderness throughout the vaginal canal not able to assess the pelvic floor rectally  Patient confirms identification and approves PT to assess internal pelvic floor and treatment Yes No emotional/communication barriers or cognitive limitation. Patient is motivated to  learn. Patient understands and agrees with treatment goals and plan. PT explains patient will be examined in standing, sitting, and lying down to see how their muscles and joints work. When they are ready, they will be asked to remove their underwear so PT can examine their perineum. The patient is also given the option of providing their own chaperone as one is not provided in our facility. The patient also has the right and is explained the right to defer or refuse any part of the evaluation or treatment including the internal exam. With the patient's consent, PT will use one gloved finger to gently assess the muscles of the pelvic floor, seeing how well it contracts and relaxes and if there is muscle symmetry. After, the patient will get dressed and PT and patient will discuss exam findings and plan of care. PT and patient discuss plan of care, schedule, attendance policy and HEP activities.   PELVIC MMT:   MMT eval  Vaginal 2/5  Internal Anal Sphincter Not able to assess due to pain in the rectum with finger in  External Anal Sphincter Not able to assess due to pain in the rectum with finger in  Puborectalis Not able to assess due to pain in the  rectum with finger in  Diastasis Recti none  (Blank rows = not tested)        TONE: Average tone  PROLAPSE: Not able to assess due to small vaginal opening  TODAY'S TREATMENT:   03/17/24 Neuromuscular re-education: Core retraining: Sitting on ball with alternate shoulder flexion 20 x Sitting on ball and doing diagonals 15 x each way Down training: Diaphragmatic breathing sitting on ball to feel the pelvic floor relax Educated patient on the urge to void to deter the urgency while walking to the bathroom Sitting on physioball with pelvic sway, pelvic tilt, pelvic diagonals, pelvic circles Exercises: Stretches/mobility: Piriformis stretch holding 30 sec each Sitting ball between knees with bilateral hip internal rotation 15 x  Sitting  hamstring stretch holding 30 sec each side Therapeutic activities: Functional strengthening activities: Educated patient on how to sit on the commode to fully empty her bladder with diaphragmatic breathing, moving hips side to side, pelvic circles, pelvic sway and tilts Self-care: Educated patient on using the vaginal moisturizers on the vulva area and internally in adjunct with Estradial.     03/03/24 Manual: Myofascial release: Fascial release of the urogenital diaphragm and levator ani going through the restrictions externally Internal pelvic floor techniques: No emotional/communication barriers or cognitive limitation. Patient is motivated to learn. Patient understands and agrees with treatment goals and plan. PT explains patient will be examined in standing, sitting, and lying down to see how their muscles and joints work. When they are ready, they will be asked to remove their underwear so PT can examine their perineum. The patient is also given the option of providing their own chaperone as one is not provided in our facility. The patient also has the right and is explained the right to defer or refuse any part of the evaluation or treatment including the internal exam. With the patient's consent, PT will use one gloved finger to gently assess the muscles of the pelvic floor, seeing how well it contracts and relaxes and if there is muscle symmetry. After, the patient will get dressed and PT and patient will discuss exam findings and plan of care. PT and patient discuss plan of care, schedule, attendance policy and HEP activities.  Therapist using her gloved finger working on the right side of the levator ani, obturator internist and along the introitus going very slowly monitoring for pain and anxiety Self-care: Educated patient to use the 4th dilator for 2 minutes then go to the 5th dilator. Reviewed with patient on moving dilator in different. Discussed with patient on how to incorporate her  husband with using the dilator.  directions and moving her hips. Gave patient Gisele searles to try with dilators.     02/27/24 Manual: Soft tissue mobilization: Manual work around the rectum, external anal sphincter, levator ani, obturator internist, along the perineal body, coccygeus, anococcygeal ligament, ischiocavernosus to elongate the tissue and reduce the tenderness while patient laying on her side and all is external Exercises: Stretches/mobility: Single knee to chest holding 30 sec with slight internal rotation Piriformis stretch holding 30 sec Self-care: Discussed the estrogen cream she is not using to improve the health of the vaginal tissue      PATIENT EDUCATION:  02/18/24 Education details: Access Code: [email or web address]; educated on how to have a bowel movement Person educated: Patient Education method: Programmer, Multimedia, Demonstration, Tactile cues, Verbal cues, and Handouts Education comprehension: verbalized understanding, returned demonstration, verbal cues required, tactile cues required, and needs further education  HOME EXERCISE PROGRAM: 02/18/24 Access Code:  [email or web address] URL: [email or web address] Date: 02/18/2024 Prepared by: Channing Pereyra  Exercises - Hooklying Transversus Abdominis Palpation  - 1 x daily - 7 x weekly - 1 sets - 10 reps - Hooklying Isometric Hip Flexion  - 1 x daily - 7 x weekly - 1 sets - 10 reps - Hooklying Isometric Hip Flexion with Opposite Arm  - 1 x daily - 7 x weekly - 1 sets - 10 reps - Supine Bridge  - 1 x daily - 7 x weekly - 1 sets - 10 reps - Seated Single Arm Shoulder Flexion with Dumbbell  - 1 x daily - 7 x weekly - 2 sets - 10 reps  Patient Education - Abdominal Massage for Constipation - Abdominal Massage for Constipation   ASSESSMENT:  CLINICAL IMPRESSION: Patient is a 63 y.o. female who was seen today for physical therapy treatment for constipation. Patient is on the 5th dilator now. Patient has had no constipation for the  past week.  Patient has had stress the last week and increased her anxiety to she was not ready for internal work today. Patient felt relaxed after the session today and will be using her physioball at home. She has learned how to empty her bladder and urge to void. Patient will benefit from skilled therapy to improve pelvic floor coordination and reduce pain and urinary leakage.   OBJECTIVE IMPAIRMENTS: decreased activity tolerance, decreased coordination, decreased strength, increased fascial restrictions, increased muscle spasms, and pain.   ACTIVITY LIMITATIONS: bending, continence, and toileting  PARTICIPATION LIMITATIONS: shopping and community activity  PERSONAL FACTORS: 1-2 comorbidities: Abdominal hernia repair; Abdominal hysterectomy; cesarean section;  are also affecting patient's functional outcome.   REHAB POTENTIAL: Good  CLINICAL DECISION MAKING: Evolving/moderate complexity  EVALUATION COMPLEXITY: Moderate   GOALS: Goals reviewed with patient? Yes  SHORT TERM GOALS: Target date: 02/17/24  Patient independent with initial HEP for core and pelvic floor strength.  Baseline: Goal status: Met 02/18/24  2.  Patient educated on how to have a bowel movement with correct breathing to relax the pelvic floor and generate force to push the stool out.  Baseline:  Goal status: Met 02/18/24  3.  Patient educated on vaginal moisturizers to improve the health of the tissue due to the Sjogren's and lack of estrogen.  Baseline:  Goal status: Met 02/09/24   LONG TERM GOALS: Target date: 04/14/24  Patient independent with advanced HEP for core and pelvic floor strength to improve continence.  Baseline:  Goal status: INITIAL  2.  Patient is able to quickly contract her pelvic floor to stop urinary leakage when she sneezes, and laugh.  Baseline:  Goal status: INITIAL  3.  Patient is able to bend over with a full bladder and engage the core and pelvic floor correctly to prevent  urine from leaking.  Baseline:  Goal status: INITIAL  4.  Patient is able to place the largest dilator into the vaginal canal so she is able to have vaginal penetration and vaginal exam.  Baseline:  Goal status: INITIAL    PLAN:  PT FREQUENCY: 1-2x/week  PT DURATION: 12 weeks  PLANNED INTERVENTIONS: 97110-Therapeutic exercises, 97530- Therapeutic activity, 97112- Neuromuscular re-education, 97535- Self Care, [id number]- Manual therapy, [id number]- Electrical stimulation (unattended), 20560 (1-2 muscles), 20561 (3+ muscles)- Dry Needling, Patient/Family education, Cryotherapy, Moist heat, and Biofeedback  PLAN FOR NEXT SESSION:  manual work on the ovaries and left pelvic floor vaginally,  core engagement  Channing Pereyra, PT 03/17/24 11:00 AM

## 2024-03-24 ENCOUNTER — Ambulatory Visit: Payer: Self-pay | Admitting: Physical Therapy

## 2024-03-24 ENCOUNTER — Encounter: Payer: Self-pay | Admitting: Physical Therapy

## 2024-03-24 DIAGNOSIS — R252 Cramp and spasm: Secondary | ICD-10-CM

## 2024-03-24 DIAGNOSIS — M6281 Muscle weakness (generalized): Secondary | ICD-10-CM

## 2024-03-24 DIAGNOSIS — R102 Pelvic and perineal pain unspecified side: Secondary | ICD-10-CM

## 2024-03-24 NOTE — Therapy (Signed)
 OUTPATIENT PHYSICAL THERAPY FEMALE PELVIC TREATMENT   Patient Name: Kristine Curtis MRN: [id number] DOB:04/30/1961, 63 y.o., female Today's Date: 03/24/2024  END OF SESSION:  PT End of Session - 03/24/24 1532     Visit Number 7    Date for Recertification  04/14/24    Authorization Type [employer] medicare    Authorization Time Period 01/21/2024 - 04/14/2024    Authorization - Visit Number 7    Authorization - Number of Visits 12    Progress Note Due on Visit 10    PT Start Time 1530    PT Stop Time 1610    PT Time Calculation (min) 40 min    Activity Tolerance Patient tolerated treatment well    Behavior During Therapy [facility] for tasks assessed/performed          Past Medical History:  Diagnosis Date   Allergic rhinitis due to pollen    Anemia    Anxiety    Asthma    Carpal tunnel syndrome of right wrist 12/22/2017   Cataract    Chronic combined systolic and diastolic CHF (congestive heart failure) (HCC)    06/2016 Echo: EF 40-45%, Gr1 DD   Chronic fatigue    Depression    Fall    Family history of polycystic kidney with negative CT 11/16/2012   Fibromyalgia    GERD (gastroesophageal reflux disease)    Helicobacter pylori gastritis 05/2020   Hypertension    Hypothyroidism    IBS (irritable bowel syndrome)    Internal hemorrhoids    Lymphocytic colitis    Morbid obesity (HCC)    NICM (nonischemic cardiomyopathy) (HCC)    a. 1999 Cath: nl cors;  b. EF prev as low as 35%;  c. 11/2015 Echo: Ef 40-45%;  d. 06/2016 Echo: EF 40-45%;  e. Lexiscan  MV: fixed anterosepta/inferseptal defect w/o ischemia, EF 35%.   Osteoarthritis    PONV (postoperative nausea and vomiting)    PVC (premature ventricular contraction)    Restless leg syndrome    Sjogren's syndrome    Vertigo    Past Surgical History:  Procedure Laterality Date   ABDOMINAL HERNIA REPAIR     ABDOMINAL HYSTERECTOMY     CESAREAN SECTION     CHOLECYSTECTOMY  10/2008   COLONOSCOPY  02/14/2015    ESOPHAGOGASTRODUODENOSCOPY     EYE SURGERY     2 2013 and 1981/DCR of right eye 05/2014   LUMBAR DISC SURGERY  2016   L3-4   RIGHT/LEFT HEART CATH AND CORONARY ANGIOGRAPHY N/A 07/11/2016   Procedure: Right/Left Heart Cath and Coronary Angiography;  Surgeon: Peter M Jordan, MD;  Location: [facility] INVASIVE CV LAB;  Service: Cardiovascular;  Laterality: N/A;   SHOULDER ARTHROSCOPY W/ SUBACROMIAL DECOMPRESSION AND DISTAL CLAVICLE EXCISION Right    SPINE SURGERY     C6-C7, 03/2017   TUBAL LIGATION  1992   Patient Active Problem List   Diagnosis Date Noted   Constipation 11/21/2023   Helicobacter pylori gastritis 05/15/2021   Hormone replacement therapy (HRT) 07/29/2019   IBS (irritable bowel syndrome) 11/10/2017   B12 deficiency 11/10/2017   OSA (obstructive sleep apnea) 11/10/2017   Myalgia due to statin 11/10/2017   Lumbar disc disease 10/01/2017   RLS (restless legs syndrome) 10/01/2017   Myofascial pain dysfunction syndrome 07/14/2017   Cervical radiculopathy 07/14/2017   Degenerative arthritis of left knee, XRAY12/2018 07/13/2017   Morbid obesity (HCC) 02/24/2017   Sjogren's disease 02/24/2017   Fibromyalgia 01/21/2017   Chronic low back pain with sciatica  05/02/2015   Nonischemic cardiomyopathy (HCC) 03/22/2015   Abdominal pain 11/05/2013   Vitamin D  deficiency 10/09/2009   Depression with anxiety 08/31/2008   Essential hypertension 08/31/2008   Allergic rhinitis 08/31/2008   Moderate persistent asthma 08/31/2008   Esophageal reflux 08/31/2008   Acquired hypothyroidism 08/30/2008   Classical migraine without intractable migraine 08/30/2008    PCP: Hughie Sharper, MD  REFERRING PROVIDER: Cayetano Harlene BIRCH, PA-C   REFERRING DIAG: K59.00 (ICD-10-CM) - Constipation, unspecified constipation type   THERAPY DIAG:  Muscle weakness (generalized)  Cramp and spasm  Pelvic pain  Rationale for Evaluation and Treatment: Rehabilitation  ONSET DATE: 10/12/23  SUBJECTIVE:                                                                                                                                                                                            SUBJECTIVE STATEMENT: Patient is not straining to have a bowel movement. I will use a massage to assist with bowel movement.  Patient starts with the 4th dilator and finishes with the 5th dilator.    FUNCTIONAL LIMITATIONS: none  PERTINENT HISTORY:  Medications for current condition: Estradiol; Linzess ;  Surgeries: Abdominal hernia repair; Abdominal hysterectomy; cesarean section;  Other: Chronic fatigue,; Fibromyalgia; hypothyroidism; IBS; Lymphocytic colitis; Sjogren's syndrome; Cholecystectomy; Lumbar disc surgery;  Sexual abuse: No  PAIN:  Are you having pain? No  PRECAUTIONS: None  RED FLAGS: None   WEIGHT BEARING RESTRICTIONS: No  FALLS:  Has patient fallen in last 6 months? Yes. Number of falls 1 off her step but not due to balance  OCCUPATION: disability  ACTIVITY LEVEL : active, does weights, working on walking, flexibility exercise  PLOF: Independent  PATIENT GOALS: reduce leakage, improve constipation, improve pelvic floor health   BOWEL MOVEMENT: Pain with bowel movement: No, since she has been on the Linzess  Type of bowel movement:Type (Bristol Stool Scale) Type 5 and 6 with Linzess , Frequency 1-3 days, Strain no , and Splinting no Fully empty rectum: Yes: with linzess  Leakage: No                                                     Fiber supplement/laxative No  URINATION: Pain with urination: No Fully empty bladder: No; has to lean forward and move around and make a conscious effort                Post-void dribble: when she does not take her time Stream: Strong at first  then is weak Urgency: Yes  Frequency:during the day 10-12                                Nocturia: Yes: 1    Leakage: Coughing, Sneezing, Laughing, and hold urine too long and bend over Pads/briefs: Yes: if she is  going to be someplace without a bathroom then wears a pad  INTERCOURSE:  Ability to have vaginal penetration No  pain level was 10/10 last time she tried so has stopped.  Pain with intercourse: Initial Penetration, During Penetration, and Deep Penetration Dryness: Yes , stopped intercourse due to vaginal dryness Marinoff Scale: 3/3 Lubricant:yes  PREGNANCY: C-section deliveries 2   PROLAPSE: Pressure and Bulge   OBJECTIVE:  Note: Objective measures were completed at Evaluation unless otherwise noted.  DIAGNOSTIC FINDINGS:  none  PATIENT SURVEYS:  PFIQ-7: 90 UIQ-7: 43 POPIQ-7; 48  COGNITION: Overall cognitive status: Within functional limits for tasks assessed     SENSATION: Light touch: Appears intact    FUNCTIONAL TESTS:  Single leg stance:  Rt:3 sec  Lt:2 sec Squat:able to squat   POSTURE: rounded shoulders, forward head, increased thoracic kyphosis, and posterior pelvic tilt   LUMBARAROM/PROM:  A/PROM A/PROM  Eval (% available)  Flexion full  Extension Decreased by 50%  Right lateral flexion Decreased by 25%  Left lateral flexion Decreased by 25%  Right rotation Decreased by 25%  Left rotation Decreased by 25%   (Blank rows = not tested)  LOWER EXTREMITY ROM:  Passive ROM Right eval Left eval  Hip internal rotation 0 0   (Blank rows = not tested)  PALPATION:   Pelvic Alignment: left ASIS is higher  Abdominal: rib cage angle greater than 90 degrees; lifts rib cage to contract abdominal  Diastasis: No Distortion: Yes , increased upper abdominal girth compared to lower  Breathing: chest breather Scar tissue: Yes: cesarean restricted                External Perineal Exam: introitus was small, pink coloring                             Internal Pelvic Floor: tenderness throughout the vaginal canal not able to assess the pelvic floor rectally  Patient confirms identification and approves PT to assess internal pelvic floor and treatment  Yes No emotional/communication barriers or cognitive limitation. Patient is motivated to learn. Patient understands and agrees with treatment goals and plan. PT explains patient will be examined in standing, sitting, and lying down to see how their muscles and joints work. When they are ready, they will be asked to remove their underwear so PT can examine their perineum. The patient is also given the option of providing their own chaperone as one is not provided in our facility. The patient also has the right and is explained the right to defer or refuse any part of the evaluation or treatment including the internal exam. With the patient's consent, PT will use one gloved finger to gently assess the muscles of the pelvic floor, seeing how well it contracts and relaxes and if there is muscle symmetry. After, the patient will get dressed and PT and patient will discuss exam findings and plan of care. PT and patient discuss plan of care, schedule, attendance policy and HEP activities.   PELVIC MMT:   MMT eval 03/24/24  Vaginal 2/5 5/5  Internal  Anal Sphincter Not able to assess due to pain in the rectum with finger in   External Anal Sphincter Not able to assess due to pain in the rectum with finger in   Puborectalis Not able to assess due to pain in the rectum with finger in   Diastasis Recti none   (Blank rows = not tested)        TONE: Average tone  PROLAPSE: Not able to assess due to small vaginal opening  TODAY'S TREATMENT:   03/24/24 Manual: Myofascial release: Release of the urogenital diaphragm going through the restrictions  Release around the round ligament and clitoris  Internal pelvic floor techniques: No emotional/communication barriers or cognitive limitation. Patient is motivated to learn. Patient understands and agrees with treatment goals and plan. PT explains patient will be examined in standing, sitting, and lying down to see how their muscles and joints work. When they are  ready, they will be asked to remove their underwear so PT can examine their perineum. The patient is also given the option of providing their own chaperone as one is not provided in our facility. The patient also has the right and is explained the right to defer or refuse any part of the evaluation or treatment including the internal exam. With the patient's consent, PT will use one gloved finger to gently assess the muscles of the pelvic floor, seeing how well it contracts and relaxes and if there is muscle symmetry. After, the patient will get dressed and PT and patient will discuss exam findings and plan of care. PT and patient discuss plan of care, schedule, attendance policy and HEP activities.  Therapist gloved finger in the vaginal canal working on the right levator ani, along the obturator internist to elongate the muscles Therapist gloved finger on the anterior right above the pubic bone and other finger on the outside of the same area releasing the tissue  Self-care: Discussed with patient on using the dilator with her husband and how it went to     03/17/24 Neuromuscular re-education: Core retraining: Sitting on ball with alternate shoulder flexion 20 x Sitting on ball and doing diagonals 15 x each way Down training: Diaphragmatic breathing sitting on ball to feel the pelvic floor relax Educated patient on the urge to void to deter the urgency while walking to the bathroom Sitting on physioball with pelvic sway, pelvic tilt, pelvic diagonals, pelvic circles Exercises: Stretches/mobility: Piriformis stretch holding 30 sec each Sitting ball between knees with bilateral hip internal rotation 15 x  Sitting hamstring stretch holding 30 sec each side Therapeutic activities: Functional strengthening activities: Educated patient on how to sit on the commode to fully empty her bladder with diaphragmatic breathing, moving hips side to side, pelvic circles, pelvic sway and  tilts Self-care: Educated patient on using the vaginal moisturizers on the vulva area and internally in adjunct with Estradial.     03/03/24 Manual: Myofascial release: Fascial release of the urogenital diaphragm and levator ani going through the restrictions externally Internal pelvic floor techniques: No emotional/communication barriers or cognitive limitation. Patient is motivated to learn. Patient understands and agrees with treatment goals and plan. PT explains patient will be examined in standing, sitting, and lying down to see how their muscles and joints work. When they are ready, they will be asked to remove their underwear so PT can examine their perineum. The patient is also given the option of providing their own chaperone as one is not provided in our facility. The patient also  has the right and is explained the right to defer or refuse any part of the evaluation or treatment including the internal exam. With the patient's consent, PT will use one gloved finger to gently assess the muscles of the pelvic floor, seeing how well it contracts and relaxes and if there is muscle symmetry. After, the patient will get dressed and PT and patient will discuss exam findings and plan of care. PT and patient discuss plan of care, schedule, attendance policy and HEP activities.  Therapist using her gloved finger working on the right side of the levator ani, obturator internist and along the introitus going very slowly monitoring for pain and anxiety Self-care: Educated patient to use the 4th dilator for 2 minutes then go to the 5th dilator. Reviewed with patient on moving dilator in different. Discussed with patient on how to incorporate her husband with using the dilator.  directions and moving her hips. Gave patient Gisele searles to try with dilators.     02/27/24 Manual: Soft tissue mobilization: Manual work around the rectum, external anal sphincter, levator ani, obturator internist, along the  perineal body, coccygeus, anococcygeal ligament, ischiocavernosus to elongate the tissue and reduce the tenderness while patient laying on her side and all is external Exercises: Stretches/mobility: Single knee to chest holding 30 sec with slight internal rotation Piriformis stretch holding 30 sec Self-care: Discussed the estrogen cream she is not using to improve the health of the vaginal tissue      PATIENT EDUCATION:  02/18/24 Education details: Access Code: [email or web address]; educated on how to have a bowel movement Person educated: Patient Education method: Programmer, Multimedia, Demonstration, Tactile cues, Verbal cues, and Handouts Education comprehension: verbalized understanding, returned demonstration, verbal cues required, tactile cues required, and needs further education  HOME EXERCISE PROGRAM: 02/18/24 Access Code: [email or web address] URL: [email or web address] Date: 02/18/2024 Prepared by: Channing Pereyra  Exercises - Hooklying Transversus Abdominis Palpation  - 1 x daily - 7 x weekly - 1 sets - 10 reps - Hooklying Isometric Hip Flexion  - 1 x daily - 7 x weekly - 1 sets - 10 reps - Hooklying Isometric Hip Flexion with Opposite Arm  - 1 x daily - 7 x weekly - 1 sets - 10 reps - Supine Bridge  - 1 x daily - 7 x weekly - 1 sets - 10 reps - Seated Single Arm Shoulder Flexion with Dumbbell  - 1 x daily - 7 x weekly - 2 sets - 10 reps  Patient Education - Abdominal Massage for Constipation - Abdominal Massage for Constipation   ASSESSMENT:  CLINICAL IMPRESSION: Patient is a 63 y.o. female who was seen today for physical therapy treatment for constipation. Patient is on the 5th dilator now. Patient has had no constipation for the past week or urinary leakage. Pelvic floor strength has increased to 5/5.   She has done well with the pelvic floor manual work and the trigger points reduced and reduction of fascial restrictions.  Patient will benefit from skilled therapy to improve pelvic  floor coordination and reduce pain and urinary leakage.   OBJECTIVE IMPAIRMENTS: decreased activity tolerance, decreased coordination, decreased strength, increased fascial restrictions, increased muscle spasms, and pain.   ACTIVITY LIMITATIONS: bending, continence, and toileting  PARTICIPATION LIMITATIONS: shopping and community activity  PERSONAL FACTORS: 1-2 comorbidities: Abdominal hernia repair; Abdominal hysterectomy; cesarean section;  are also affecting patient's functional outcome.   REHAB POTENTIAL: Good  CLINICAL DECISION MAKING: Evolving/moderate complexity  EVALUATION COMPLEXITY: Moderate   GOALS: Goals reviewed with patient?  Yes  SHORT TERM GOALS: Target date: 02/17/24  Patient independent with initial HEP for core and pelvic floor strength.  Baseline: Goal status: Met 02/18/24  2.  Patient educated on how to have a bowel movement with correct breathing to relax the pelvic floor and generate force to push the stool out.  Baseline:  Goal status: Met 02/18/24  3.  Patient educated on vaginal moisturizers to improve the health of the tissue due to the Sjogren's and lack of estrogen.  Baseline:  Goal status: Met 02/09/24   LONG TERM GOALS: Target date: 04/14/24  Patient independent with advanced HEP for core and pelvic floor strength to improve continence.  Baseline:  Goal status: INITIAL  2.  Patient is able to quickly contract her pelvic floor to stop urinary leakage when she sneezes, and laugh.  Baseline:  Goal status: INITIAL  3.  Patient is able to bend over with a full bladder and engage the core and pelvic floor correctly to prevent urine from leaking.  Baseline:  Goal status: INITIAL  4.  Patient is able to place the largest dilator into the vaginal canal so she is able to have vaginal penetration and vaginal exam.  Baseline:  Goal status: INITIAL    PLAN:  PT FREQUENCY: 1-2x/week  PT DURATION: 12 weeks  PLANNED INTERVENTIONS:  97110-Therapeutic exercises, 97530- Therapeutic activity, 97112- Neuromuscular re-education, 97535- Self Care, [id number]- Manual therapy, [id number]- Electrical stimulation (unattended), 20560 (1-2 muscles), 20561 (3+ muscles)- Dry Needling, Patient/Family education, Cryotherapy, Moist heat, and Biofeedback  PLAN FOR NEXT SESSION:  manual work on the ovaries and left pelvic floor vaginally,  core engagement  Channing Pereyra, PT 03/24/24 4:15 PM

## 2024-03-31 ENCOUNTER — Ambulatory Visit: Payer: Self-pay | Admitting: Physical Therapy

## 2024-03-31 ENCOUNTER — Encounter: Payer: Self-pay | Admitting: Physical Therapy

## 2024-03-31 DIAGNOSIS — R252 Cramp and spasm: Secondary | ICD-10-CM

## 2024-03-31 DIAGNOSIS — M6281 Muscle weakness (generalized): Secondary | ICD-10-CM | POA: Diagnosis not present

## 2024-03-31 DIAGNOSIS — R102 Pelvic and perineal pain unspecified side: Secondary | ICD-10-CM

## 2024-03-31 NOTE — Therapy (Signed)
 OUTPATIENT PHYSICAL THERAPY FEMALE PELVIC TREATMENT   Patient Name: Kristine Curtis MRN: [id number] DOB:07/11/1960, 63 y.o., female Today's Date: 03/31/2024  END OF SESSION:  PT End of Session - 03/31/24 0937     Visit Number 8    Date for Recertification  07/07/24    Authorization Type [employer] medicare    Authorization Time Period 01/21/2024 - 04/14/2024    Authorization - Visit Number 8    Authorization - Number of Visits 12    Progress Note Due on Visit 10    PT Start Time 0930    PT Stop Time 1010    PT Time Calculation (min) 40 min    Activity Tolerance Patient tolerated treatment well    Behavior During Therapy [facility] for tasks assessed/performed          Past Medical History:  Diagnosis Date   Allergic rhinitis due to pollen    Anemia    Anxiety    Asthma    Carpal tunnel syndrome of right wrist 12/22/2017   Cataract    Chronic combined systolic and diastolic CHF (congestive heart failure) (HCC)    06/2016 Echo: EF 40-45%, Gr1 DD   Chronic fatigue    Depression    Fall    Family history of polycystic kidney with negative CT 11/16/2012   Fibromyalgia    GERD (gastroesophageal reflux disease)    Helicobacter pylori gastritis 05/2020   Hypertension    Hypothyroidism    IBS (irritable bowel syndrome)    Internal hemorrhoids    Lymphocytic colitis    Morbid obesity (HCC)    NICM (nonischemic cardiomyopathy) (HCC)    a. 1999 Cath: nl cors;  b. EF prev as low as 35%;  c. 11/2015 Echo: Ef 40-45%;  d. 06/2016 Echo: EF 40-45%;  e. Lexiscan  MV: fixed anterosepta/inferseptal defect w/o ischemia, EF 35%.   Osteoarthritis    PONV (postoperative nausea and vomiting)    PVC (premature ventricular contraction)    Restless leg syndrome    Sjogren's syndrome    Vertigo    Past Surgical History:  Procedure Laterality Date   ABDOMINAL HERNIA REPAIR     ABDOMINAL HYSTERECTOMY     CESAREAN SECTION     CHOLECYSTECTOMY  10/2008   COLONOSCOPY  02/14/2015    ESOPHAGOGASTRODUODENOSCOPY     EYE SURGERY     2 2013 and 1981/DCR of right eye 05/2014   LUMBAR DISC SURGERY  2016   L3-4   RIGHT/LEFT HEART CATH AND CORONARY ANGIOGRAPHY N/A 07/11/2016   Procedure: Right/Left Heart Cath and Coronary Angiography;  Surgeon: Peter M Jordan, MD;  Location: [facility] INVASIVE CV LAB;  Service: Cardiovascular;  Laterality: N/A;   SHOULDER ARTHROSCOPY W/ SUBACROMIAL DECOMPRESSION AND DISTAL CLAVICLE EXCISION Right    SPINE SURGERY     C6-C7, 03/2017   TUBAL LIGATION  1992   Patient Active Problem List   Diagnosis Date Noted   Constipation 11/21/2023   Helicobacter pylori gastritis 05/15/2021   Hormone replacement therapy (HRT) 07/29/2019   IBS (irritable bowel syndrome) 11/10/2017   B12 deficiency 11/10/2017   OSA (obstructive sleep apnea) 11/10/2017   Myalgia due to statin 11/10/2017   Lumbar disc disease 10/01/2017   RLS (restless legs syndrome) 10/01/2017   Myofascial pain dysfunction syndrome 07/14/2017   Cervical radiculopathy 07/14/2017   Degenerative arthritis of left knee, XRAY12/2018 07/13/2017   Morbid obesity (HCC) 02/24/2017   Sjogren's disease 02/24/2017   Fibromyalgia 01/21/2017   Chronic low back pain with sciatica  05/02/2015   Nonischemic cardiomyopathy (HCC) 03/22/2015   Abdominal pain 11/05/2013   Vitamin D  deficiency 10/09/2009   Depression with anxiety 08/31/2008   Essential hypertension 08/31/2008   Allergic rhinitis 08/31/2008   Moderate persistent asthma 08/31/2008   Esophageal reflux 08/31/2008   Acquired hypothyroidism 08/30/2008   Classical migraine without intractable migraine 08/30/2008    PCP: Hughie Sharper, MD  REFERRING PROVIDER: Cayetano Harlene BIRCH, PA-C   REFERRING DIAG: K59.00 (ICD-10-CM) - Constipation, unspecified constipation type   THERAPY DIAG:  Muscle weakness (generalized)  Cramp and spasm  Pelvic pain  Rationale for Evaluation and Treatment: Rehabilitation  ONSET DATE: 10/12/23  SUBJECTIVE:                                                                                                                                                                                            SUBJECTIVE STATEMENT: Patient is not straining to have a bowel movement. I will use a massage to assist with bowel movement.  Patient starts with the 4th dilator and finishes with the 5th dilator.    FUNCTIONAL LIMITATIONS: none  PERTINENT HISTORY:  Medications for current condition: Estradiol; Linzess ;  Surgeries: Abdominal hernia repair; Abdominal hysterectomy; cesarean section;  Other: Chronic fatigue,; Fibromyalgia; hypothyroidism; IBS; Lymphocytic colitis; Sjogren's syndrome; Cholecystectomy; Lumbar disc surgery;  Sexual abuse: No  PAIN:  Are you having pain? No  PRECAUTIONS: None  RED FLAGS: None   WEIGHT BEARING RESTRICTIONS: No  FALLS:  Has patient fallen in last 6 months? Yes. Number of falls 1 off her step but not due to balance  OCCUPATION: disability  ACTIVITY LEVEL : active, does weights, working on walking, flexibility exercise  PLOF: Independent  PATIENT GOALS: reduce leakage, improve constipation, improve pelvic floor health   BOWEL MOVEMENT: Pain with bowel movement: No, since she has been on the Linzess  Type of bowel movement:Type (Bristol Stool Scale) Type 5 and 6 with Linzess , Frequency 1-3 days, Strain no , and Splinting no Fully empty rectum: Yes: with linzess  Leakage: No                                                     Fiber supplement/laxative No  URINATION: Pain with urination: No Fully empty bladder: No; has to lean forward and move around and make a conscious effort                Post-void dribble: when she does not take her time Stream: Strong at first  then is weak Urgency: Yes  Frequency:during the day 10-12                                Nocturia: Yes: 1    Leakage: Coughing, Sneezing, Laughing, and hold urine too long and bend over Pads/briefs: Yes: if she is  going to be someplace without a bathroom then wears a pad  INTERCOURSE:  Ability to have vaginal penetration No  pain level was 10/10 last time she tried so has stopped.  Pain with intercourse: Initial Penetration, During Penetration, and Deep Penetration Dryness: Yes , stopped intercourse due to vaginal dryness Marinoff Scale: 3/3 Lubricant:yes  PREGNANCY: C-section deliveries 2   PROLAPSE: Pressure and Bulge   OBJECTIVE:  Note: Objective measures were completed at Evaluation unless otherwise noted.  DIAGNOSTIC FINDINGS:  none  PATIENT SURVEYS:  PFIQ-7: 90 UIQ-7: 43 POPIQ-7; 48 03/31/24: PFIQ-7: 67 UIQ-7: 38 POPIQ-7; 29  COGNITION: Overall cognitive status: Within functional limits for tasks assessed     SENSATION: Light touch: Appears intact    FUNCTIONAL TESTS:  Single leg stance:  Rt:3 sec  Lt:2 sec Squat:able to squat   POSTURE: rounded shoulders, forward head, increased thoracic kyphosis, and posterior pelvic tilt   LUMBARAROM/PROM:  A/PROM A/PROM  Eval (% available) 03/31/24 (% available)  Flexion full full  Extension Decreased by 50% full  Right lateral flexion Decreased by 25% full  Left lateral flexion Decreased by 25% 75  Right rotation Decreased by 25% full  Left rotation Decreased by 25% full   (Blank rows = not tested)  LOWER EXTREMITY ROM:  Passive ROM Right eval Left eval Right/left 03/31/24  Hip internal rotation 0 0 10   (Blank rows = not tested)  PALPATION:   Pelvic Alignment: left ASIS is higher  Abdominal: rib cage angle greater than 90 degrees; lifts rib cage to contract abdominal  Diastasis: No Distortion: Yes , increased upper abdominal girth compared to lower  Breathing: chest breather Scar tissue: Yes: cesarean restricted                External Perineal Exam: introitus was small, pink coloring                             Internal Pelvic Floor: tenderness throughout the vaginal canal not able to assess the  pelvic floor rectally  Patient confirms identification and approves PT to assess internal pelvic floor and treatment Yes No emotional/communication barriers or cognitive limitation. Patient is motivated to learn. Patient understands and agrees with treatment goals and plan. PT explains patient will be examined in standing, sitting, and lying down to see how their muscles and joints work. When they are ready, they will be asked to remove their underwear so PT can examine their perineum. The patient is also given the option of providing their own chaperone as one is not provided in our facility. The patient also has the right and is explained the right to defer or refuse any part of the evaluation or treatment including the internal exam. With the patient's consent, PT will use one gloved finger to gently assess the muscles of the pelvic floor, seeing how well it contracts and relaxes and if there is muscle symmetry. After, the patient will get dressed and PT and patient will discuss exam findings and plan of care. PT and patient discuss plan of care, schedule, attendance  policy and HEP activities.   PELVIC MMT:   MMT eval 03/24/24  Vaginal 2/5 5/5  Internal Anal Sphincter Not able to assess due to pain in the rectum with finger in   External Anal Sphincter Not able to assess due to pain in the rectum with finger in   Puborectalis Not able to assess due to pain in the rectum with finger in   Diastasis Recti none   (Blank rows = not tested)        TONE: Average tone  PROLAPSE: Not able to assess due to small vaginal opening  TODAY'S TREATMENT:   03/31/24 Exercises: Strengthening: Bridge with ball squeeze 15 x  Supine marching 20 x each with tactile cues to bring ribs together and add the lower abdominals Supine anti rotational movement 2 x 10 each way Therapeutic activities: Functional strengthening activities: Self-care: Reviewed patients goals to write new auth for insurance     03/24/24 Manual: Myofascial release: Release of the urogenital diaphragm going through the restrictions  Release around the round ligament and clitoris  Internal pelvic floor techniques: No emotional/communication barriers or cognitive limitation. Patient is motivated to learn. Patient understands and agrees with treatment goals and plan. PT explains patient will be examined in standing, sitting, and lying down to see how their muscles and joints work. When they are ready, they will be asked to remove their underwear so PT can examine their perineum. The patient is also given the option of providing their own chaperone as one is not provided in our facility. The patient also has the right and is explained the right to defer or refuse any part of the evaluation or treatment including the internal exam. With the patient's consent, PT will use one gloved finger to gently assess the muscles of the pelvic floor, seeing how well it contracts and relaxes and if there is muscle symmetry. After, the patient will get dressed and PT and patient will discuss exam findings and plan of care. PT and patient discuss plan of care, schedule, attendance policy and HEP activities.  Therapist gloved finger in the vaginal canal working on the right levator ani, along the obturator internist to elongate the muscles Therapist gloved finger on the anterior right above the pubic bone and other finger on the outside of the same area releasing the tissue  Self-care: Discussed with patient on using the dilator with her husband and how it went to     03/17/24 Neuromuscular re-education: Core retraining: Sitting on ball with alternate shoulder flexion 20 x Sitting on ball and doing diagonals 15 x each way Down training: Diaphragmatic breathing sitting on ball to feel the pelvic floor relax Educated patient on the urge to void to deter the urgency while walking to the bathroom Sitting on physioball with pelvic sway, pelvic  tilt, pelvic diagonals, pelvic circles Exercises: Stretches/mobility: Piriformis stretch holding 30 sec each Sitting ball between knees with bilateral hip internal rotation 15 x  Sitting hamstring stretch holding 30 sec each side Therapeutic activities: Functional strengthening activities: Educated patient on how to sit on the commode to fully empty her bladder with diaphragmatic breathing, moving hips side to side, pelvic circles, pelvic sway and tilts Self-care: Educated patient on using the vaginal moisturizers on the vulva area and internally in adjunct with Estradial.       PATIENT EDUCATION:  03/31/24 Education details: Access Code: [email or web address]; educated on how to have a bowel movement Person educated: Patient Education method: Explanation, Demonstration, Tactile cues, Verbal cues,  and Handouts Education comprehension: verbalized understanding, returned demonstration, verbal cues required, tactile cues required, and needs further education  HOME EXERCISE PROGRAM: 03/31/24 Access Code: [email or web address] URL: [email or web address] Date: 03/31/2024 Prepared by: Channing Pereyra  Exercises - Hooklying Transversus Abdominis Palpation  - 1 x daily - 7 x weekly - 1 sets - 10 reps - Supine Bridge  - 1 x daily - 7 x weekly - 1 sets - 10 reps - Seated Single Arm Shoulder Flexion with Dumbbell  - 1 x daily - 7 x weekly - 2 sets - 10 reps - Supine March  - 1 x daily - 3 x weekly - 2 sets - 10 reps - Hooklying Anti-Rotation Press With Anchored Resistance  - 1 x daily - 1 x weekly - 2 sets - 10 reps  Patient Education - Abdominal Massage for Constipation - Abdominal Massage for Constipation  ASSESSMENT:  CLINICAL IMPRESSION: Patient is a 64 y.o. female who was seen today for physical therapy treatment for constipation. Patient is on the 5th dilator now. Patient has had no constipation for the past week. Patient has had urinary leakage with urgency. Patient will be going to the doctor  to check on a UTI due to the recent symptoms of burning and urgency.  Pelvic floor strength has increased to 5/5.   Patient reports bending over with urinary leakage is  50% betterShe has done well with the pelvic floor manual work and the trigger points reduced and reduction of fascial restrictions.  Patient is able to push the stool out and not straining to have a bowel movement. She does the abdominal massage to assist with bowel movements. Patient has increased in lumbar ROM. PFIQ-7 is  67 compared to 90. Patient has increased in hip internal rotation bilateral. Patient will benefit from skilled therapy to improve pelvic floor coordination and reduce pain and urinary leakage.   OBJECTIVE IMPAIRMENTS: decreased activity tolerance, decreased coordination, decreased strength, increased fascial restrictions, increased muscle spasms, and pain.   ACTIVITY LIMITATIONS: bending, continence, and toileting  PARTICIPATION LIMITATIONS: shopping and community activity  PERSONAL FACTORS: 1-2 comorbidities: Abdominal hernia repair; Abdominal hysterectomy; cesarean section;  are also affecting patient's functional outcome.   REHAB POTENTIAL: Good  CLINICAL DECISION MAKING: Evolving/moderate complexity  EVALUATION COMPLEXITY: Moderate   GOALS: Goals reviewed with patient? Yes  SHORT TERM GOALS: Target date: 02/17/24  Patient independent with initial HEP for core and pelvic floor strength.  Baseline: Goal status: Met 02/18/24  2.  Patient educated on how to have a bowel movement with correct breathing to relax the pelvic floor and generate force to push the stool out.  Baseline:  Goal status: Met 02/18/24  3.  Patient educated on vaginal moisturizers to improve the health of the tissue due to the Sjogren's and lack of estrogen.  Baseline:  Goal status: Met 02/09/24   LONG TERM GOALS: Target date: 07/07/24  Patient independent with advanced HEP for core and pelvic floor strength to improve  continence.  Baseline:  Goal status: ongoing 03/31/24  2.  Patient is able to quickly contract her pelvic floor to stop urinary leakage when she sneezes, and laugh.  Baseline:  Goal status: Ongoing 03/31/24  3.  Patient is able to bend over with a full bladder and engage the core and pelvic floor correctly to prevent urine from leaking.  Baseline: patient reports it is 50% better  Goal status: ongoing 03/31/24  4.  Patient is able to place the largest dilator into the vaginal canal  so she is able to have vaginal penetration and vaginal exam.  Baseline: on the 5th dilator Goal status: ongoing 03/31/24    PLAN:  PT FREQUENCY: 1-2x/week  PT DURATION: 12 weeks  PLANNED INTERVENTIONS: 97110-Therapeutic exercises, 97530- Therapeutic activity, 97112- Neuromuscular re-education, 97535- Self Care, [id number]- Manual therapy, [id number]- Electrical stimulation (unattended), 20560 (1-2 muscles), 20561 (3+ muscles)- Dry Needling, Patient/Family education, Cryotherapy, Moist heat, and Biofeedback  PLAN FOR NEXT SESSION:  manual work on the ovaries and left pelvic floor vaginally,  core engagement  Channing Pereyra, PT 03/31/24 10:15 AM

## 2024-04-05 ENCOUNTER — Ambulatory Visit: Attending: Gastroenterology | Admitting: Physical Therapy

## 2024-04-05 ENCOUNTER — Telehealth: Payer: Self-pay | Admitting: Physical Therapy

## 2024-04-05 DIAGNOSIS — M6281 Muscle weakness (generalized): Secondary | ICD-10-CM | POA: Insufficient documentation

## 2024-04-05 DIAGNOSIS — R102 Pelvic and perineal pain unspecified side: Secondary | ICD-10-CM | POA: Insufficient documentation

## 2024-04-05 DIAGNOSIS — R252 Cramp and spasm: Secondary | ICD-10-CM | POA: Insufficient documentation

## 2024-04-05 NOTE — Telephone Encounter (Signed)
 Called patient about her missed appointment today at 14:45 and she thought it was at 15:45. Patient will use the waitlist to get in sooner.  Channing Pereyra, PT @12 /1/25@ 3:13 PM

## 2024-04-14 ENCOUNTER — Ambulatory Visit: Payer: Self-pay | Admitting: Physical Therapy

## 2024-04-14 ENCOUNTER — Encounter: Payer: Self-pay | Admitting: Physical Therapy

## 2024-04-14 DIAGNOSIS — M6281 Muscle weakness (generalized): Secondary | ICD-10-CM

## 2024-04-14 DIAGNOSIS — R102 Pelvic and perineal pain unspecified side: Secondary | ICD-10-CM | POA: Diagnosis present

## 2024-04-14 DIAGNOSIS — R252 Cramp and spasm: Secondary | ICD-10-CM | POA: Diagnosis present

## 2024-04-14 NOTE — Therapy (Signed)
 OUTPATIENT PHYSICAL THERAPY FEMALE PELVIC TREATMENT   Patient Name: Kristine Curtis MRN: [id number] DOB:February 07, 1961, 63 y.o., female Today's Date: 04/14/2024  END OF SESSION:  PT End of Session - 04/14/24 1444     Visit Number 9    Date for Recertification  07/07/24    Authorization Type [employer] medicare    Authorization Time Period 01/21/2024 - 04/14/2024    Authorization - Visit Number 9    Authorization - Number of Visits 12    Progress Note Due on Visit 10    PT Start Time 1445    PT Stop Time 1525    PT Time Calculation (min) 40 min    Activity Tolerance Patient tolerated treatment well    Behavior During Therapy [facility] for tasks assessed/performed          Past Medical History:  Diagnosis Date   Allergic rhinitis due to pollen    Anemia    Anxiety    Asthma    Carpal tunnel syndrome of right wrist 12/22/2017   Cataract    Chronic combined systolic and diastolic CHF (congestive heart failure) (HCC)    06/2016 Echo: EF 40-45%, Gr1 DD   Chronic fatigue    Depression    Fall    Family history of polycystic kidney with negative CT 11/16/2012   Fibromyalgia    GERD (gastroesophageal reflux disease)    Helicobacter pylori gastritis 05/2020   Hypertension    Hypothyroidism    IBS (irritable bowel syndrome)    Internal hemorrhoids    Lymphocytic colitis    Morbid obesity (HCC)    NICM (nonischemic cardiomyopathy) (HCC)    a. 1999 Cath: nl cors;  b. EF prev as low as 35%;  c. 11/2015 Echo: Ef 40-45%;  d. 06/2016 Echo: EF 40-45%;  e. Lexiscan  MV: fixed anterosepta/inferseptal defect w/o ischemia, EF 35%.   Osteoarthritis    PONV (postoperative nausea and vomiting)    PVC (premature ventricular contraction)    Restless leg syndrome    Sjogren's syndrome    Vertigo    Past Surgical History:  Procedure Laterality Date   ABDOMINAL HERNIA REPAIR     ABDOMINAL HYSTERECTOMY     CESAREAN SECTION     CHOLECYSTECTOMY  10/2008   COLONOSCOPY  02/14/2015    ESOPHAGOGASTRODUODENOSCOPY     EYE SURGERY     2 2013 and 1981/DCR of right eye 05/2014   LUMBAR DISC SURGERY  2016   L3-4   RIGHT/LEFT HEART CATH AND CORONARY ANGIOGRAPHY N/A 07/11/2016   Procedure: Right/Left Heart Cath and Coronary Angiography;  Surgeon: Peter M Jordan, MD;  Location: [facility] INVASIVE CV LAB;  Service: Cardiovascular;  Laterality: N/A;   SHOULDER ARTHROSCOPY W/ SUBACROMIAL DECOMPRESSION AND DISTAL CLAVICLE EXCISION Right    SPINE SURGERY     C6-C7, 03/2017   TUBAL LIGATION  1992   Patient Active Problem List   Diagnosis Date Noted   Constipation 11/21/2023   Helicobacter pylori gastritis 05/15/2021   Hormone replacement therapy (HRT) 07/29/2019   IBS (irritable bowel syndrome) 11/10/2017   B12 deficiency 11/10/2017   OSA (obstructive sleep apnea) 11/10/2017   Myalgia due to statin 11/10/2017   Lumbar disc disease 10/01/2017   RLS (restless legs syndrome) 10/01/2017   Myofascial pain dysfunction syndrome 07/14/2017   Cervical radiculopathy 07/14/2017   Degenerative arthritis of left knee, XRAY12/2018 07/13/2017   Morbid obesity (HCC) 02/24/2017   Sjogren's disease 02/24/2017   Fibromyalgia 01/21/2017   Chronic low back pain with sciatica  05/02/2015   Nonischemic cardiomyopathy (HCC) 03/22/2015   Abdominal pain 11/05/2013   Vitamin D  deficiency 10/09/2009   Depression with anxiety 08/31/2008   Essential hypertension 08/31/2008   Allergic rhinitis 08/31/2008   Moderate persistent asthma 08/31/2008   Esophageal reflux 08/31/2008   Acquired hypothyroidism 08/30/2008   Classical migraine without intractable migraine 08/30/2008    PCP: Hughie Sharper, MD  REFERRING PROVIDER: Cayetano Harlene BIRCH, PA-C   REFERRING DIAG: K59.00 (ICD-10-CM) - Constipation, unspecified constipation type   THERAPY DIAG:  Muscle weakness (generalized)  Cramp and spasm  Pelvic pain  Rationale for Evaluation and Treatment: Rehabilitation  ONSET DATE: 10/12/23  SUBJECTIVE:                                                                                                                                                                                            SUBJECTIVE STATEMENT: I have more difficulty breathing when moving more than one body part.  FUNCTIONAL LIMITATIONS: none  PERTINENT HISTORY:  Medications for current condition: Estradiol; Linzess ;  Surgeries: Abdominal hernia repair; Abdominal hysterectomy; cesarean section;  Other: Chronic fatigue,; Fibromyalgia; hypothyroidism; IBS; Lymphocytic colitis; Sjogren's syndrome; Cholecystectomy; Lumbar disc surgery;  Sexual abuse: No  PAIN:  Are you having pain? No  PRECAUTIONS: None  RED FLAGS: None   WEIGHT BEARING RESTRICTIONS: No  FALLS:  Has patient fallen in last 6 months? Yes. Number of falls 1 off her step but not due to balance  OCCUPATION: disability  ACTIVITY LEVEL : active, does weights, working on walking, flexibility exercise  PLOF: Independent  PATIENT GOALS: reduce leakage, improve constipation, improve pelvic floor health   BOWEL MOVEMENT: Pain with bowel movement: No, since she has been on the Linzess  Type of bowel movement:Type (Bristol Stool Scale) Type 5 and 6 with Linzess , Frequency 1-3 days, Strain no , and Splinting no Fully empty rectum: Yes: with linzess  Leakage: No                                                     Fiber supplement/laxative No  URINATION: Pain with urination: No Fully empty bladder: No; has to lean forward and move around and make a conscious effort                Post-void dribble: when she does not take her time Stream: Strong at first then is weak Urgency: Yes  Frequency:during the day 10-12  Nocturia: Yes: 1    Leakage: Coughing, Sneezing, Laughing, and hold urine too long and bend over Pads/briefs: Yes: if she is going to be someplace without a bathroom then wears a pad  INTERCOURSE:  Ability to have vaginal  penetration No  pain level was 10/10 last time she tried so has stopped.  Pain with intercourse: Initial Penetration, During Penetration, and Deep Penetration Dryness: Yes , stopped intercourse due to vaginal dryness Marinoff Scale: 3/3 Lubricant:yes  PREGNANCY: C-section deliveries 2   PROLAPSE: Pressure and Bulge   OBJECTIVE:  Note: Objective measures were completed at Evaluation unless otherwise noted.  DIAGNOSTIC FINDINGS:  none  PATIENT SURVEYS:  PFIQ-7: 90 UIQ-7: 43 POPIQ-7; 48 03/31/24: PFIQ-7: 67 UIQ-7: 38 POPIQ-7; 29  COGNITION: Overall cognitive status: Within functional limits for tasks assessed     SENSATION: Light touch: Appears intact    FUNCTIONAL TESTS:  Single leg stance:  Rt:3 sec  Lt:2 sec Squat:able to squat   POSTURE: rounded shoulders, forward head, increased thoracic kyphosis, and posterior pelvic tilt   LUMBARAROM/PROM:  A/PROM A/PROM  Eval (% available) 03/31/24 (% available)  Flexion full full  Extension Decreased by 50% full  Right lateral flexion Decreased by 25% full  Left lateral flexion Decreased by 25% 75  Right rotation Decreased by 25% full  Left rotation Decreased by 25% full   (Blank rows = not tested)  LOWER EXTREMITY ROM:  Passive ROM Right eval Left eval Right/left 03/31/24  Hip internal rotation 0 0 10   (Blank rows = not tested)  PALPATION:   Pelvic Alignment: left ASIS is higher  Abdominal: rib cage angle greater than 90 degrees; lifts rib cage to contract abdominal  Diastasis: No Distortion: Yes , increased upper abdominal girth compared to lower  Breathing: chest breather Scar tissue: Yes: cesarean restricted                External Perineal Exam: introitus was small, pink coloring                             Internal Pelvic Floor: tenderness throughout the vaginal canal not able to assess the pelvic floor rectally  Patient confirms identification and approves PT to assess internal pelvic  floor and treatment Yes No emotional/communication barriers or cognitive limitation. Patient is motivated to learn. Patient understands and agrees with treatment goals and plan. PT explains patient will be examined in standing, sitting, and lying down to see how their muscles and joints work. When they are ready, they will be asked to remove their underwear so PT can examine their perineum. The patient is also given the option of providing their own chaperone as one is not provided in our facility. The patient also has the right and is explained the right to defer or refuse any part of the evaluation or treatment including the internal exam. With the patient's consent, PT will use one gloved finger to gently assess the muscles of the pelvic floor, seeing how well it contracts and relaxes and if there is muscle symmetry. After, the patient will get dressed and PT and patient will discuss exam findings and plan of care. PT and patient discuss plan of care, schedule, attendance policy and HEP activities.   PELVIC MMT:   MMT eval 03/24/24 04/14/24  Vaginal 2/5 5/5   Internal Anal Sphincter Not able to assess due to pain in the rectum with finger in  4/5  External Anal  Sphincter Not able to assess due to pain in the rectum with finger in  4/5  Puborectalis Not able to assess due to pain in the rectum with finger in  4/5  Diastasis Recti none  4/5  (Blank rows = not tested)        TONE: Average tone  PROLAPSE: Not able to assess due to small vaginal opening  TODAY'S TREATMENT:   04/14/24 Manual: Soft tissue mobilization: Manual work around the rectum externally, along the perineal body, and levator ani to reduce the trigger points Internal pelvic floor techniques: No emotional/communication barriers or cognitive limitation. Patient is motivated to learn. Patient understands and agrees with treatment goals and plan. PT explains patient will be examined in standing, sitting, and lying down to see  how their muscles and joints work. When they are ready, they will be asked to remove their underwear so PT can examine their perineum. The patient is also given the option of providing their own chaperone as one is not provided in our facility. The patient also has the right and is explained the right to defer or refuse any part of the evaluation or treatment including the internal exam. With the patient's consent, PT will use one gloved finger to gently assess the muscles of the pelvic floor, seeing how well it contracts and relaxes and if there is muscle symmetry. After, the patient will get dressed and PT and patient will discuss exam findings and plan of care. PT and patient discuss plan of care, schedule, attendance policy and HEP activities.  Therapist gloved finger in the rectum working on the puborectalis, anococcygeal ligament, obturator internist, along the right side of the bladder and along the right side of the bladder Neuromuscular re-education: Pelvic floor contraction training: Therapist finger in the rectum working on contracting the pelvic floor and working on pushing the therapist finger out of the rectum.     03/31/24 Exercises: Strengthening: Bridge with ball squeeze 15 x  Supine marching 20 x each with tactile cues to bring ribs together and add the lower abdominals Supine anti rotational movement 2 x 10 each way Therapeutic activities: Functional strengthening activities: Self-care: Reviewed patients goals to write new auth for insurance    03/24/24 Manual: Myofascial release: Release of the urogenital diaphragm going through the restrictions  Release around the round ligament and clitoris  Internal pelvic floor techniques: No emotional/communication barriers or cognitive limitation. Patient is motivated to learn. Patient understands and agrees with treatment goals and plan. PT explains patient will be examined in standing, sitting, and lying down to see how their  muscles and joints work. When they are ready, they will be asked to remove their underwear so PT can examine their perineum. The patient is also given the option of providing their own chaperone as one is not provided in our facility. The patient also has the right and is explained the right to defer or refuse any part of the evaluation or treatment including the internal exam. With the patient's consent, PT will use one gloved finger to gently assess the muscles of the pelvic floor, seeing how well it contracts and relaxes and if there is muscle symmetry. After, the patient will get dressed and PT and patient will discuss exam findings and plan of care. PT and patient discuss plan of care, schedule, attendance policy and HEP activities.  Therapist gloved finger in the vaginal canal working on the right levator ani, along the obturator internist to elongate the muscles Therapist gloved  finger on the anterior right above the pubic bone and other finger on the outside of the same area releasing the tissue  Self-care: Discussed with patient on using the dilator with her husband and how it went to       PATIENT EDUCATION:  03/31/24 Education details: Access Code: [email or web address]; educated on how to have a bowel movement Person educated: Patient Education method: Programmer, Multimedia, Demonstration, Tactile cues, Verbal cues, and Handouts Education comprehension: verbalized understanding, returned demonstration, verbal cues required, tactile cues required, and needs further education  HOME EXERCISE PROGRAM: 03/31/24 Access Code: [email or web address] URL: [email or web address] Date: 03/31/2024 Prepared by: Channing Pereyra  Exercises - Hooklying Transversus Abdominis Palpation  - 1 x daily - 7 x weekly - 1 sets - 10 reps - Supine Bridge  - 1 x daily - 7 x weekly - 1 sets - 10 reps - Seated Single Arm Shoulder Flexion with Dumbbell  - 1 x daily - 7 x weekly - 2 sets - 10 reps - Supine March  - 1 x daily - 3 x  weekly - 2 sets - 10 reps - Hooklying Anti-Rotation Press With Anchored Resistance  - 1 x daily - 1 x weekly - 2 sets - 10 reps  Patient Education - Abdominal Massage for Constipation - Abdominal Massage for Constipation  ASSESSMENT:  CLINICAL IMPRESSION: Patient is a 63 y.o. female who was seen today for physical therapy treatment for constipation. Patient is on the 6th dilator now. Husband is involved with the process of using the vaginal wand and vaginal dilator to assist with expansion of the tissue. No straining with bowel movements.  Stool is managed. Patient had small leakages when at the [facility]. MD reported she was able to go internally for the exam. Pelvic floor strength is 4/5. She was able to have the therapist assess the rectum for the first time She had very few trigger points. Patient will benefit from skilled therapy to improve pelvic floor coordination and reduce pain and urinary leakage.   OBJECTIVE IMPAIRMENTS: decreased activity tolerance, decreased coordination, decreased strength, increased fascial restrictions, increased muscle spasms, and pain.   ACTIVITY LIMITATIONS: bending, continence, and toileting  PARTICIPATION LIMITATIONS: shopping and community activity  PERSONAL FACTORS: 1-2 comorbidities: Abdominal hernia repair; Abdominal hysterectomy; cesarean section;  are also affecting patient's functional outcome.   REHAB POTENTIAL: Good  CLINICAL DECISION MAKING: Evolving/moderate complexity  EVALUATION COMPLEXITY: Moderate   GOALS: Goals reviewed with patient? Yes  SHORT TERM GOALS: Target date: 02/17/24  Patient independent with initial HEP for core and pelvic floor strength.  Baseline: Goal status: Met 02/18/24  2.  Patient educated on how to have a bowel movement with correct breathing to relax the pelvic floor and generate force to push the stool out.  Baseline:  Goal status: Met 02/18/24  3.  Patient educated on vaginal moisturizers to  improve the health of the tissue due to the Sjogren's and lack of estrogen.  Baseline:  Goal status: Met 02/09/24   LONG TERM GOALS: Target date: 07/07/24  Patient independent with advanced HEP for core and pelvic floor strength to improve continence.  Baseline:  Goal status: ongoing 03/31/24  2.  Patient is able to quickly contract her pelvic floor to stop urinary leakage when she sneezes, and laugh.  Baseline:  Goal status: Ongoing 03/31/24  3.  Patient is able to bend over with a full bladder and engage the core and pelvic floor correctly to prevent urine from leaking.  Baseline: patient reports it  is 50% better  Goal status: ongoing 03/31/24  4.  Patient is able to place the largest dilator into the vaginal canal so she is able to have vaginal penetration and vaginal exam.  Baseline: on the 5th dilator Goal status: ongoing 03/31/24    PLAN:  PT FREQUENCY: 1-2x/week  PT DURATION: 12 weeks  PLANNED INTERVENTIONS: 97110-Therapeutic exercises, 97530- Therapeutic activity, 97112- Neuromuscular re-education, 97535- Self Care, [id number]- Manual therapy, [id number]- Electrical stimulation (unattended), 20560 (1-2 muscles), 20561 (3+ muscles)- Dry Needling, Patient/Family education, Cryotherapy, Moist heat, and Biofeedback  PLAN FOR NEXT SESSION:  manual work on the ovaries and left pelvic floor vaginally,  core engagement; 10th note needs to be done (9/19-12/19)  Channing Pereyra, PT 04/14/24 3:28 PM

## 2024-04-23 ENCOUNTER — Ambulatory Visit: Admitting: Physical Therapy

## 2024-04-23 DIAGNOSIS — R252 Cramp and spasm: Secondary | ICD-10-CM

## 2024-04-23 DIAGNOSIS — R102 Pelvic and perineal pain unspecified side: Secondary | ICD-10-CM

## 2024-04-23 DIAGNOSIS — M6281 Muscle weakness (generalized): Secondary | ICD-10-CM

## 2024-04-23 NOTE — Therapy (Signed)
 " OUTPATIENT PHYSICAL THERAPY FEMALE PELVIC TREATMENT   Patient Name: Kristine Curtis MRN: [id number] DOB:1960/12/22, 63 y.o., female Today's Date: 04/23/2024  Progress Note Reporting Period 01/23/24 to 04/23/24  See note below for Objective Data and Assessment of Progress/Goals.     END OF SESSION:  PT End of Session - 04/23/24 1019     Visit Number 10    Date for Recertification  07/07/24    Authorization Type [employer] medicare    Authorization Time Period 01/21/2024 - 04/14/2024    Authorization - Visit Number 10    Authorization - Number of Visits 12    Progress Note Due on Visit 10    PT Start Time 1015    PT Stop Time 1055    PT Time Calculation (min) 40 min    Activity Tolerance Patient tolerated treatment well    Behavior During Therapy WFL for tasks assessed/performed          Past Medical History:  Diagnosis Date   Allergic rhinitis due to pollen    Anemia    Anxiety    Asthma    Carpal tunnel syndrome of right wrist 12/22/2017   Cataract    Chronic combined systolic and diastolic CHF (congestive heart failure) (HCC)    06/2016 Echo: EF 40-45%, Gr1 DD   Chronic fatigue    Depression    Fall    Family history of polycystic kidney with negative CT 11/16/2012   Fibromyalgia    GERD (gastroesophageal reflux disease)    Helicobacter pylori gastritis 05/2020   Hypertension    Hypothyroidism    IBS (irritable bowel syndrome)    Internal hemorrhoids    Lymphocytic colitis    Morbid obesity (HCC)    NICM (nonischemic cardiomyopathy) (HCC)    a. 1999 Cath: nl cors;  b. EF prev as low as 35%;  c. 11/2015 Echo: Ef 40-45%;  d. 06/2016 Echo: EF 40-45%;  e. Lexiscan  MV: fixed anterosepta/inferseptal defect w/o ischemia, EF 35%.   Osteoarthritis    PONV (postoperative nausea and vomiting)    PVC (premature ventricular contraction)    Restless leg syndrome    Sjogren's syndrome    Vertigo    Past Surgical History:  Procedure Laterality Date   ABDOMINAL HERNIA  REPAIR     ABDOMINAL HYSTERECTOMY     CESAREAN SECTION     CHOLECYSTECTOMY  10/2008   COLONOSCOPY  02/14/2015   ESOPHAGOGASTRODUODENOSCOPY     EYE SURGERY     2 2013 and 1981/DCR of right eye 05/2014   LUMBAR DISC SURGERY  2016   L3-4   RIGHT/LEFT HEART CATH AND CORONARY ANGIOGRAPHY N/A 07/11/2016   Procedure: Right/Left Heart Cath and Coronary Angiography;  Surgeon: Peter M Jordan, MD;  Location: [facility] INVASIVE CV LAB;  Service: Cardiovascular;  Laterality: N/A;   SHOULDER ARTHROSCOPY W/ SUBACROMIAL DECOMPRESSION AND DISTAL CLAVICLE EXCISION Right    SPINE SURGERY     C6-C7, 03/2017   TUBAL LIGATION  1992   Patient Active Problem List   Diagnosis Date Noted   Constipation 11/21/2023   Helicobacter pylori gastritis 05/15/2021   Hormone replacement therapy (HRT) 07/29/2019   IBS (irritable bowel syndrome) 11/10/2017   B12 deficiency 11/10/2017   OSA (obstructive sleep apnea) 11/10/2017   Myalgia due to statin 11/10/2017   Lumbar disc disease 10/01/2017   RLS (restless legs syndrome) 10/01/2017   Myofascial pain dysfunction syndrome 07/14/2017   Cervical radiculopathy 07/14/2017   Degenerative arthritis of left knee, XRAY12/2018 07/13/2017  Morbid obesity (HCC) 02/24/2017   Sjogren's disease 02/24/2017   Fibromyalgia 01/21/2017   Chronic low back pain with sciatica 05/02/2015   Nonischemic cardiomyopathy (HCC) 03/22/2015   Abdominal pain 11/05/2013   Vitamin D  deficiency 10/09/2009   Depression with anxiety 08/31/2008   Essential hypertension 08/31/2008   Allergic rhinitis 08/31/2008   Moderate persistent asthma 08/31/2008   Esophageal reflux 08/31/2008   Acquired hypothyroidism 08/30/2008   Classical migraine without intractable migraine 08/30/2008    PCP: Hughie Sharper, MD  REFERRING PROVIDER: Cayetano Harlene BIRCH, PA-C   REFERRING DIAG: K59.00 (ICD-10-CM) - Constipation, unspecified constipation type   THERAPY DIAG:  Muscle weakness (generalized)  Cramp and  spasm  Pelvic pain  Rationale for Evaluation and Treatment: Rehabilitation  ONSET DATE: 10/12/23  SUBJECTIVE:                                                                                                                                                                                           SUBJECTIVE STATEMENT: I have not progressed beyond the 6 th dilator. Patient urinates every 1-2 hours. The morning feels like she has a full bladder. Last bowel movement was normal and easy but took 5 days to have one.    FUNCTIONAL LIMITATIONS: none  PERTINENT HISTORY:  Medications for current condition: Estradiol; Linzess ;  Surgeries: Abdominal hernia repair; Abdominal hysterectomy; cesarean section;  Other: Chronic fatigue; Fibromyalgia; hypothyroidism; IBS; Lymphocytic colitis; Sjogren's syndrome; Cholecystectomy; Lumbar disc surgery;  Sexual abuse: No  PAIN:  Are you having pain? No  PRECAUTIONS: None  RED FLAGS: None   WEIGHT BEARING RESTRICTIONS: No  FALLS:  Has patient fallen in last 6 months? Yes. Number of falls 1 off her step but not due to balance  OCCUPATION: disability  ACTIVITY LEVEL : active, does weights, working on walking, flexibility exercise  PLOF: Independent  PATIENT GOALS: reduce leakage, improve constipation, improve pelvic floor health   BOWEL MOVEMENT: Pain with bowel movement: No, since she has been on the Linzess  Type of bowel movement:Type (Bristol Stool Scale) Type 5 and 6 with Linzess , Frequency 1-3 days, Strain no , and Splinting no Fully empty rectum: Yes: with linzess  Leakage: No                                                     Fiber supplement/laxative No  URINATION: Pain with urination: No Fully empty bladder: No; has to lean forward and move around and make a conscious effort  Post-void dribble: when she does not take her time Stream: Strong at first then is weak Urgency: Yes  Frequency:during the day 10-12                                 Nocturia: Yes: 1    Leakage: Coughing, Sneezing, Laughing, and hold urine too long and bend over Pads/briefs: Yes: if she is going to be someplace without a bathroom then wears a pad  INTERCOURSE:  Ability to have vaginal penetration No  pain level was 10/10 last time she tried so has stopped.  Pain with intercourse: Initial Penetration, During Penetration, and Deep Penetration Dryness: Yes , stopped intercourse due to vaginal dryness Marinoff Scale: 3/3 Lubricant:yes  PREGNANCY: C-section deliveries 2   PROLAPSE: Pressure and Bulge   OBJECTIVE:  Note: Objective measures were completed at Evaluation unless otherwise noted.  DIAGNOSTIC FINDINGS:  none  PATIENT SURVEYS:  PFIQ-7: 90 UIQ-7: 43 POPIQ-7; 48 03/31/24: PFIQ-7: 67 UIQ-7: 38 POPIQ-7; 29  COGNITION: Overall cognitive status: Within functional limits for tasks assessed     SENSATION: Light touch: Appears intact    FUNCTIONAL TESTS:  Single leg stance:  Rt:3 sec  Lt:2 sec Squat:able to squat   POSTURE: rounded shoulders, forward head, increased thoracic kyphosis, and posterior pelvic tilt   LUMBARAROM/PROM:  A/PROM A/PROM  Eval (% available) 03/31/24 (% available)  Flexion full full  Extension Decreased by 50% full  Right lateral flexion Decreased by 25% full  Left lateral flexion Decreased by 25% 75  Right rotation Decreased by 25% full  Left rotation Decreased by 25% full   (Blank rows = not tested)  LOWER EXTREMITY ROM:  Passive ROM Right eval Left eval Right/left 03/31/24  Hip internal rotation 0 0 10   (Blank rows = not tested)  PALPATION:   Pelvic Alignment: left ASIS is higher  Abdominal: rib cage angle greater than 90 degrees; lifts rib cage to contract abdominal  Diastasis: No Distortion: Yes , increased upper abdominal girth compared to lower  Breathing: chest breather Scar tissue: Yes: cesarean restricted                External Perineal Exam:  introitus was small, pink coloring                             Internal Pelvic Floor: tenderness throughout the vaginal canal not able to assess the pelvic floor rectally  Patient confirms identification and approves PT to assess internal pelvic floor and treatment Yes No emotional/communication barriers or cognitive limitation. Patient is motivated to learn. Patient understands and agrees with treatment goals and plan. PT explains patient will be examined in standing, sitting, and lying down to see how their muscles and joints work. When they are ready, they will be asked to remove their underwear so PT can examine their perineum. The patient is also given the option of providing their own chaperone as one is not provided in our facility. The patient also has the right and is explained the right to defer or refuse any part of the evaluation or treatment including the internal exam. With the patient's consent, PT will use one gloved finger to gently assess the muscles of the pelvic floor, seeing how well it contracts and relaxes and if there is muscle symmetry. After, the patient will get dressed and PT and patient will discuss exam findings  and plan of care. PT and patient discuss plan of care, schedule, attendance policy and HEP activities.   PELVIC MMT:   MMT eval 03/24/24 04/14/24  Vaginal 2/5 5/5   Internal Anal Sphincter Not able to assess due to pain in the rectum with finger in  4/5  External Anal Sphincter Not able to assess due to pain in the rectum with finger in  4/5  Puborectalis Not able to assess due to pain in the rectum with finger in  4/5  Diastasis Recti none  4/5  (Blank rows = not tested)        TONE: Average tone  PROLAPSE: Not able to assess due to small vaginal opening  TODAY'S TREATMENT:   04/23/24 Neuromuscular re-education: Core facilitation: Laying on side and moving hands down side to work the obliques 15 x each  Laying on side flexing hip up and out to work  the obliques 15 x each Laying on side hip adductor 20 x each  Exercises: Stretches/mobility: Supine rock knees back and forth 20 x  Single knee to chest holding 30 sec each  2 Figure 4 stretch in supine holding 30 sec x 2 each Butterfly stretch holding 1 min   04/14/24 Manual: Soft tissue mobilization: Manual work around the rectum externally, along the perineal body, and levator ani to reduce the trigger points Internal pelvic floor techniques: No emotional/communication barriers or cognitive limitation. Patient is motivated to learn. Patient understands and agrees with treatment goals and plan. PT explains patient will be examined in standing, sitting, and lying down to see how their muscles and joints work. When they are ready, they will be asked to remove their underwear so PT can examine their perineum. The patient is also given the option of providing their own chaperone as one is not provided in our facility. The patient also has the right and is explained the right to defer or refuse any part of the evaluation or treatment including the internal exam. With the patient's consent, PT will use one gloved finger to gently assess the muscles of the pelvic floor, seeing how well it contracts and relaxes and if there is muscle symmetry. After, the patient will get dressed and PT and patient will discuss exam findings and plan of care. PT and patient discuss plan of care, schedule, attendance policy and HEP activities.  Therapist gloved finger in the rectum working on the puborectalis, anococcygeal ligament, obturator internist, along the right side of the bladder and along the right side of the bladder Neuromuscular re-education: Pelvic floor contraction training: Therapist finger in the rectum working on contracting the pelvic floor and working on pushing the therapist finger out of the rectum.     03/31/24 Exercises: Strengthening: Bridge with ball squeeze 15 x  Supine marching 20 x each  with tactile cues to bring ribs together and add the lower abdominals Supine anti rotational movement 2 x 10 each way Therapeutic activities: Functional strengthening activities: Self-care: Reviewed patients goals to write new auth for insurance    03/24/24 Manual: Myofascial release: Release of the urogenital diaphragm going through the restrictions  Release around the round ligament and clitoris  Internal pelvic floor techniques: No emotional/communication barriers or cognitive limitation. Patient is motivated to learn. Patient understands and agrees with treatment goals and plan. PT explains patient will be examined in standing, sitting, and lying down to see how their muscles and joints work. When they are ready, they will be asked to remove their underwear so  PT can examine their perineum. The patient is also given the option of providing their own chaperone as one is not provided in our facility. The patient also has the right and is explained the right to defer or refuse any part of the evaluation or treatment including the internal exam. With the patient's consent, PT will use one gloved finger to gently assess the muscles of the pelvic floor, seeing how well it contracts and relaxes and if there is muscle symmetry. After, the patient will get dressed and PT and patient will discuss exam findings and plan of care. PT and patient discuss plan of care, schedule, attendance policy and HEP activities.  Therapist gloved finger in the vaginal canal working on the right levator ani, along the obturator internist to elongate the muscles Therapist gloved finger on the anterior right above the pubic bone and other finger on the outside of the same area releasing the tissue  Self-care: Discussed with patient on using the dilator with her husband and how it went to       PATIENT EDUCATION:  03/31/24 Education details: Access Code: [email or web address]; educated on how to have a bowel movement Person  educated: Patient Education method: Programmer, Multimedia, Demonstration, Tactile cues, Verbal cues, and Handouts Education comprehension: verbalized understanding, returned demonstration, verbal cues required, tactile cues required, and needs further education  HOME EXERCISE PROGRAM: 03/31/24 Access Code: [email or web address] URL: [email or web address] Date: 03/31/2024 Prepared by: Channing Pereyra  Exercises - Hooklying Transversus Abdominis Palpation  - 1 x daily - 7 x weekly - 1 sets - 10 reps - Supine Bridge  - 1 x daily - 7 x weekly - 1 sets - 10 reps - Seated Single Arm Shoulder Flexion with Dumbbell  - 1 x daily - 7 x weekly - 2 sets - 10 reps - Supine March  - 1 x daily - 3 x weekly - 2 sets - 10 reps - Hooklying Anti-Rotation Press With Anchored Resistance  - 1 x daily - 1 x weekly - 2 sets - 10 reps  Patient Education - Abdominal Massage for Constipation - Abdominal Massage for Constipation  ASSESSMENT:  CLINICAL IMPRESSION: Patient is a 63 y.o. female who was seen today for physical therapy treatment for constipation.  Patient was sore from a fall the other day so therapy focused on stretches and core work. She was able to have a full bowel movement and did not strain for first time. It did take her 5 days to have a bowel movement. She was not as sore after therapy. She has learned how to engage the obliques in therapy. She can empty her bladder 50% of the time. Patient will benefit from skilled therapy to improve pelvic floor coordination and reduce pain and urinary leakage.   OBJECTIVE IMPAIRMENTS: decreased activity tolerance, decreased coordination, decreased strength, increased fascial restrictions, increased muscle spasms, and pain.   ACTIVITY LIMITATIONS: bending, continence, and toileting  PARTICIPATION LIMITATIONS: shopping and community activity  PERSONAL FACTORS: 1-2 comorbidities: Abdominal hernia repair; Abdominal hysterectomy; cesarean section;  are also affecting  patient's functional outcome.   REHAB POTENTIAL: Good  CLINICAL DECISION MAKING: Evolving/moderate complexity  EVALUATION COMPLEXITY: Moderate   GOALS: Goals reviewed with patient? Yes  SHORT TERM GOALS: Target date: 02/17/24  Patient independent with initial HEP for core and pelvic floor strength.  Baseline: Goal status: Met 02/18/24  2.  Patient educated on how to have a bowel movement with correct breathing to relax the pelvic floor and generate force to push the stool  out.  Baseline:  Goal status: Met 02/18/24  3.  Patient educated on vaginal moisturizers to improve the health of the tissue due to the Sjogren's and lack of estrogen.  Baseline:  Goal status: Met 02/09/24   LONG TERM GOALS: Target date: 07/07/24  Patient independent with advanced HEP for core and pelvic floor strength to improve continence.  Baseline:  Goal status: ongoing 04/23/24  2.  Patient is able to quickly contract her pelvic floor to stop urinary leakage when she sneezes, and laugh.  Baseline:  Goal status: Ongoing 04/23/24  3.  Patient is able to bend over with a full bladder and engage the core and pelvic floor correctly to prevent urine from leaking.  Baseline: patient reports it is 50% better  Goal status: ongoing 04/23/24  4.  Patient is able to place the largest dilator into the vaginal canal so she is able to have vaginal penetration and vaginal exam.  Baseline: on the 5th dilator Goal status: ongoing 04/23/24    PLAN:  PT FREQUENCY: 1-2x/week  PT DURATION: 12 weeks  PLANNED INTERVENTIONS: 97110-Therapeutic exercises, 97530- Therapeutic activity, 97112- Neuromuscular re-education, 97535- Self Care, [id number]- Manual therapy, [id number]- Electrical stimulation (unattended), 20560 (1-2 muscles), 20561 (3+ muscles)- Dry Needling, Patient/Family education, Cryotherapy, Moist heat, and Biofeedback  PLAN FOR NEXT SESSION:  manual work on the ovaries and left pelvic floor vaginally,  core  engagement;  Channing Pereyra, PT 04/23/2024 11:02 AM   "

## 2024-04-26 ENCOUNTER — Ambulatory Visit: Admitting: Physical Therapy

## 2024-04-26 ENCOUNTER — Encounter: Payer: Self-pay | Admitting: Physical Therapy

## 2024-04-26 DIAGNOSIS — R102 Pelvic and perineal pain unspecified side: Secondary | ICD-10-CM

## 2024-04-26 DIAGNOSIS — R252 Cramp and spasm: Secondary | ICD-10-CM

## 2024-04-26 DIAGNOSIS — M6281 Muscle weakness (generalized): Secondary | ICD-10-CM

## 2024-04-26 NOTE — Therapy (Signed)
 " OUTPATIENT PHYSICAL THERAPY FEMALE PELVIC TREATMENT   Patient Name: SERIYAH COLLISON MRN: [id number] DOB:Jan 11, 1961, 63 y.o., female Today's Date: 04/26/2024     END OF SESSION:  PT End of Session - 04/26/24 1536     Visit Number 11    Date for Recertification  07/07/24    Authorization Type [employer] medicare    Authorization Time Period 04/15/24-06/10/2024    Authorization - Visit Number 2    Authorization - Number of Visits 8    Progress Note Due on Visit 20    PT Start Time 1530    PT Stop Time 1610    PT Time Calculation (min) 40 min    Activity Tolerance Patient tolerated treatment well    Behavior During Therapy [facility] for tasks assessed/performed          Past Medical History:  Diagnosis Date   Allergic rhinitis due to pollen    Anemia    Anxiety    Asthma    Carpal tunnel syndrome of right wrist 12/22/2017   Cataract    Chronic combined systolic and diastolic CHF (congestive heart failure) (HCC)    06/2016 Echo: EF 40-45%, Gr1 DD   Chronic fatigue    Depression    Fall    Family history of polycystic kidney with negative CT 11/16/2012   Fibromyalgia    GERD (gastroesophageal reflux disease)    Helicobacter pylori gastritis 05/2020   Hypertension    Hypothyroidism    IBS (irritable bowel syndrome)    Internal hemorrhoids    Lymphocytic colitis    Morbid obesity (HCC)    NICM (nonischemic cardiomyopathy) (HCC)    a. 1999 Cath: nl cors;  b. EF prev as low as 35%;  c. 11/2015 Echo: Ef 40-45%;  d. 06/2016 Echo: EF 40-45%;  e. Lexiscan  MV: fixed anterosepta/inferseptal defect w/o ischemia, EF 35%.   Osteoarthritis    PONV (postoperative nausea and vomiting)    PVC (premature ventricular contraction)    Restless leg syndrome    Sjogren's syndrome    Vertigo    Past Surgical History:  Procedure Laterality Date   ABDOMINAL HERNIA REPAIR     ABDOMINAL HYSTERECTOMY     CESAREAN SECTION     CHOLECYSTECTOMY  10/2008   COLONOSCOPY  02/14/2015    ESOPHAGOGASTRODUODENOSCOPY     EYE SURGERY     2 2013 and 1981/DCR of right eye 05/2014   LUMBAR DISC SURGERY  2016   L3-4   RIGHT/LEFT HEART CATH AND CORONARY ANGIOGRAPHY N/A 07/11/2016   Procedure: Right/Left Heart Cath and Coronary Angiography;  Surgeon: Peter M Jordan, MD;  Location: [facility] INVASIVE CV LAB;  Service: Cardiovascular;  Laterality: N/A;   SHOULDER ARTHROSCOPY W/ SUBACROMIAL DECOMPRESSION AND DISTAL CLAVICLE EXCISION Right    SPINE SURGERY     C6-C7, 03/2017   TUBAL LIGATION  1992   Patient Active Problem List   Diagnosis Date Noted   Constipation 11/21/2023   Helicobacter pylori gastritis 05/15/2021   Hormone replacement therapy (HRT) 07/29/2019   IBS (irritable bowel syndrome) 11/10/2017   B12 deficiency 11/10/2017   OSA (obstructive sleep apnea) 11/10/2017   Myalgia due to statin 11/10/2017   Lumbar disc disease 10/01/2017   RLS (restless legs syndrome) 10/01/2017   Myofascial pain dysfunction syndrome 07/14/2017   Cervical radiculopathy 07/14/2017   Degenerative arthritis of left knee, XRAY12/2018 07/13/2017   Morbid obesity (HCC) 02/24/2017   Sjogren's disease 02/24/2017   Fibromyalgia 01/21/2017   Chronic low back pain  with sciatica 05/02/2015   Nonischemic cardiomyopathy (HCC) 03/22/2015   Abdominal pain 11/05/2013   Vitamin D  deficiency 10/09/2009   Depression with anxiety 08/31/2008   Essential hypertension 08/31/2008   Allergic rhinitis 08/31/2008   Moderate persistent asthma 08/31/2008   Esophageal reflux 08/31/2008   Acquired hypothyroidism 08/30/2008   Classical migraine without intractable migraine 08/30/2008    PCP: Hughie Sharper, MD  REFERRING PROVIDER: Cayetano Harlene BIRCH, PA-C   REFERRING DIAG: K59.00 (ICD-10-CM) - Constipation, unspecified constipation type   THERAPY DIAG:  Muscle weakness (generalized)  Cramp and spasm  Pelvic pain  Rationale for Evaluation and Treatment: Rehabilitation  ONSET DATE: 10/12/23  SUBJECTIVE:                                                                                                                                                                                            SUBJECTIVE STATEMENT: My soreness is going away.    FUNCTIONAL LIMITATIONS: none  PERTINENT HISTORY:  Medications for current condition: Estradiol; Linzess ;  Surgeries: Abdominal hernia repair; Abdominal hysterectomy; cesarean section;  Other: Chronic fatigue; Fibromyalgia; hypothyroidism; IBS; Lymphocytic colitis; Sjogren's syndrome; Cholecystectomy; Lumbar disc surgery;  Sexual abuse: No  PAIN:  Are you having pain? No  PRECAUTIONS: None  RED FLAGS: None   WEIGHT BEARING RESTRICTIONS: No  FALLS:  Has patient fallen in last 6 months? Yes. Number of falls 1 off her step but not due to balance  OCCUPATION: disability  ACTIVITY LEVEL : active, does weights, working on walking, flexibility exercise  PLOF: Independent  PATIENT GOALS: reduce leakage, improve constipation, improve pelvic floor health   BOWEL MOVEMENT: Pain with bowel movement: No, since she has been on the Linzess  Type of bowel movement:Type (Bristol Stool Scale) Type 5 and 6 with Linzess , Frequency 1-3 days, Strain no , and Splinting no Fully empty rectum: Yes: with linzess  Leakage: No                                                     Fiber supplement/laxative No  URINATION: Pain with urination: No Fully empty bladder: No; has to lean forward and move around and make a conscious effort                Post-void dribble: when she does not take her time Stream: Strong at first then is weak Urgency: Yes  Frequency:during the day 10-12  Nocturia: Yes: 1    Leakage: Coughing, Sneezing, Laughing, and hold urine too long and bend over Pads/briefs: Yes: if she is going to be someplace without a bathroom then wears a pad  INTERCOURSE:  Ability to have vaginal penetration No  pain level was 10/10 last time she  tried so has stopped.  Pain with intercourse: Initial Penetration, During Penetration, and Deep Penetration Dryness: Yes , stopped intercourse due to vaginal dryness Marinoff Scale: 3/3 Lubricant:yes  PREGNANCY: C-section deliveries 2   PROLAPSE: Pressure and Bulge   OBJECTIVE:  Note: Objective measures were completed at Evaluation unless otherwise noted.  DIAGNOSTIC FINDINGS:  none  PATIENT SURVEYS:  PFIQ-7: 90 UIQ-7: 43 POPIQ-7; 48 03/31/24: PFIQ-7: 67 UIQ-7: 38 POPIQ-7; 29  COGNITION: Overall cognitive status: Within functional limits for tasks assessed     SENSATION: Light touch: Appears intact    FUNCTIONAL TESTS:  Single leg stance:  Rt:3 sec  Lt:2 sec Squat:able to squat   POSTURE: rounded shoulders, forward head, increased thoracic kyphosis, and posterior pelvic tilt   LUMBARAROM/PROM:  A/PROM A/PROM  Eval (% available) 03/31/24 (% available)  Flexion full full  Extension Decreased by 50% full  Right lateral flexion Decreased by 25% full  Left lateral flexion Decreased by 25% 75  Right rotation Decreased by 25% full  Left rotation Decreased by 25% full   (Blank rows = not tested)  LOWER EXTREMITY ROM:  Passive ROM Right eval Left eval Right/left 03/31/24  Hip internal rotation 0 0 10   (Blank rows = not tested)  PALPATION:   Pelvic Alignment: left ASIS is higher  Abdominal: rib cage angle greater than 90 degrees; lifts rib cage to contract abdominal  Diastasis: No Distortion: Yes , increased upper abdominal girth compared to lower  Breathing: chest breather Scar tissue: Yes: cesarean restricted                External Perineal Exam: introitus was small, pink coloring                             Internal Pelvic Floor: tenderness throughout the vaginal canal not able to assess the pelvic floor rectally  Patient confirms identification and approves PT to assess internal pelvic floor and treatment Yes No emotional/communication  barriers or cognitive limitation. Patient is motivated to learn. Patient understands and agrees with treatment goals and plan. PT explains patient will be examined in standing, sitting, and lying down to see how their muscles and joints work. When they are ready, they will be asked to remove their underwear so PT can examine their perineum. The patient is also given the option of providing their own chaperone as one is not provided in our facility. The patient also has the right and is explained the right to defer or refuse any part of the evaluation or treatment including the internal exam. With the patient's consent, PT will use one gloved finger to gently assess the muscles of the pelvic floor, seeing how well it contracts and relaxes and if there is muscle symmetry. After, the patient will get dressed and PT and patient will discuss exam findings and plan of care. PT and patient discuss plan of care, schedule, attendance policy and HEP activities.   PELVIC MMT:   MMT eval 03/24/24 04/14/24 04/26/24  Vaginal 2/5 5/5  5/5  Internal Anal Sphincter Not able to assess due to pain in the rectum with finger in  4/5 5/5  External Anal Sphincter Not able to assess due to pain in the rectum with finger in  4/5 5/5  Puborectalis Not able to assess due to pain in the rectum with finger in  4/5 5/5  Diastasis Recti none  4/5 5/5  (Blank rows = not tested)        TONE: Average tone  PROLAPSE: Not able to assess due to small vaginal opening  TODAY'S TREATMENT:   04/26/24 Manual: Soft tissue mobilization: Manual work to the perineal body and along the external rectal area to elongate Internal pelvic floor techniques: No emotional/communication barriers or cognitive limitation. Patient is motivated to learn. Patient understands and agrees with treatment goals and plan. PT explains patient will be examined in standing, sitting, and lying down to see how their muscles and joints work. When they are ready,  they will be asked to remove their underwear so PT can examine their perineum. The patient is also given the option of providing their own chaperone as one is not provided in our facility. The patient also has the right and is explained the right to defer or refuse any part of the evaluation or treatment including the internal exam. With the patient's consent, PT will use one gloved finger to gently assess the muscles of the pelvic floor, seeing how well it contracts and relaxes and if there is muscle symmetry. After, the patient will get dressed and PT and patient will discuss exam findings and plan of care. PT and patient discuss plan of care, schedule, attendance policy and HEP activities.  Therapist gloved finger in the vaginal canal working on the levator ani and obturator internist to elongate tissue and reduce trigger points  Therapist gloved finger in the rectum working on the external anal sphincter, anococcygeal ligament, and puborectalis to reduce trigger points Neuromuscular re-education: Down training: Therapist gloved finger in the vaginal canal feeling the pelvic floor relax with diaphragmatic breathing    04/23/24 Neuromuscular re-education: Core facilitation: Laying on side and moving hands down side to work the obliques 15 x each  Laying on side flexing hip up and out to work the obliques 15 x each Laying on side hip adductor 20 x each  Exercises: Stretches/mobility: Supine rock knees back and forth 20 x  Single knee to chest holding 30 sec each  2 Figure 4 stretch in supine holding 30 sec x 2 each Butterfly stretch holding 1 min   04/14/24 Manual: Soft tissue mobilization: Manual work around the rectum externally, along the perineal body, and levator ani to reduce the trigger points Internal pelvic floor techniques: No emotional/communication barriers or cognitive limitation. Patient is motivated to learn. Patient understands and agrees with treatment goals and plan. PT  explains patient will be examined in standing, sitting, and lying down to see how their muscles and joints work. When they are ready, they will be asked to remove their underwear so PT can examine their perineum. The patient is also given the option of providing their own chaperone as one is not provided in our facility. The patient also has the right and is explained the right to defer or refuse any part of the evaluation or treatment including the internal exam. With the patient's consent, PT will use one gloved finger to gently assess the muscles of the pelvic floor, seeing how well it contracts and relaxes and if there is muscle symmetry. After, the patient will get dressed and PT and patient will discuss exam findings and plan of care. PT  and patient discuss plan of care, schedule, attendance policy and HEP activities.  Therapist gloved finger in the rectum working on the puborectalis, anococcygeal ligament, obturator internist, along the right side of the bladder and along the right side of the bladder Neuromuscular re-education: Pelvic floor contraction training: Therapist finger in the rectum working on contracting the pelvic floor and working on pushing the therapist finger out of the rectum.     03/31/24 Exercises: Strengthening: Bridge with ball squeeze 15 x  Supine marching 20 x each with tactile cues to bring ribs together and add the lower abdominals Supine anti rotational movement 2 x 10 each way Therapeutic activities: Functional strengthening activities: Self-care: Reviewed patients goals to write new auth for insurance    03/24/24 Manual: Myofascial release: Release of the urogenital diaphragm going through the restrictions  Release around the round ligament and clitoris  Internal pelvic floor techniques: No emotional/communication barriers or cognitive limitation. Patient is motivated to learn. Patient understands and agrees with treatment goals and plan. PT explains  patient will be examined in standing, sitting, and lying down to see how their muscles and joints work. When they are ready, they will be asked to remove their underwear so PT can examine their perineum. The patient is also given the option of providing their own chaperone as one is not provided in our facility. The patient also has the right and is explained the right to defer or refuse any part of the evaluation or treatment including the internal exam. With the patient's consent, PT will use one gloved finger to gently assess the muscles of the pelvic floor, seeing how well it contracts and relaxes and if there is muscle symmetry. After, the patient will get dressed and PT and patient will discuss exam findings and plan of care. PT and patient discuss plan of care, schedule, attendance policy and HEP activities.  Therapist gloved finger in the vaginal canal working on the right levator ani, along the obturator internist to elongate the muscles Therapist gloved finger on the anterior right above the pubic bone and other finger on the outside of the same area releasing the tissue  Self-care: Discussed with patient on using the dilator with her husband and how it went to       PATIENT EDUCATION:  03/31/24 Education details: Access Code: [email or web address]; educated on how to have a bowel movement Person educated: Patient Education method: Programmer, Multimedia, Demonstration, Tactile cues, Verbal cues, and Handouts Education comprehension: verbalized understanding, returned demonstration, verbal cues required, tactile cues required, and needs further education  HOME EXERCISE PROGRAM: 03/31/24 Access Code: [email or web address] URL: [email or web address] Date: 03/31/2024 Prepared by: Channing Pereyra  Exercises - Hooklying Transversus Abdominis Palpation  - 1 x daily - 7 x weekly - 1 sets - 10 reps - Supine Bridge  - 1 x daily - 7 x weekly - 1 sets - 10 reps - Seated Single Arm Shoulder Flexion with Dumbbell  - 1 x  daily - 7 x weekly - 2 sets - 10 reps - Supine March  - 1 x daily - 3 x weekly - 2 sets - 10 reps - Hooklying Anti-Rotation Press With Anchored Resistance  - 1 x daily - 1 x weekly - 2 sets - 10 reps  Patient Education - Abdominal Massage for Constipation - Abdominal Massage for Constipation  ASSESSMENT:  CLINICAL IMPRESSION: Patient is a 63 y.o. female who was seen today for physical therapy treatment for constipation.  Pelvic floor and rectal strength is 5/5. She  had good lengthening of her pelvic floor from the manual work. She was able to perform a pelvic floor drop with diaphragmatic breathing. Patient will benefit from skilled therapy to improve pelvic floor coordination and reduce pain and urinary leakage.   OBJECTIVE IMPAIRMENTS: decreased activity tolerance, decreased coordination, decreased strength, increased fascial restrictions, increased muscle spasms, and pain.   ACTIVITY LIMITATIONS: bending, continence, and toileting  PARTICIPATION LIMITATIONS: shopping and community activity  PERSONAL FACTORS: 1-2 comorbidities: Abdominal hernia repair; Abdominal hysterectomy; cesarean section;  are also affecting patient's functional outcome.   REHAB POTENTIAL: Good  CLINICAL DECISION MAKING: Evolving/moderate complexity  EVALUATION COMPLEXITY: Moderate   GOALS: Goals reviewed with patient? Yes  SHORT TERM GOALS: Target date: 02/17/24  Patient independent with initial HEP for core and pelvic floor strength.  Baseline: Goal status: Met 02/18/24  2.  Patient educated on how to have a bowel movement with correct breathing to relax the pelvic floor and generate force to push the stool out.  Baseline:  Goal status: Met 02/18/24  3.  Patient educated on vaginal moisturizers to improve the health of the tissue due to the Sjogren's and lack of estrogen.  Baseline:  Goal status: Met 02/09/24   LONG TERM GOALS: Target date: 07/07/24  Patient independent with advanced HEP for core  and pelvic floor strength to improve continence.  Baseline:  Goal status: ongoing 04/23/24  2.  Patient is able to quickly contract her pelvic floor to stop urinary leakage when she sneezes, and laugh.  Baseline:  Goal status: Ongoing 04/23/24  3.  Patient is able to bend over with a full bladder and engage the core and pelvic floor correctly to prevent urine from leaking.  Baseline: patient reports it is 50% better  Goal status: ongoing 04/23/24  4.  Patient is able to place the largest dilator into the vaginal canal so she is able to have vaginal penetration and vaginal exam.  Baseline: on the 5th dilator Goal status: ongoing 04/23/24    PLAN:  PT FREQUENCY: 1-2x/week  PT DURATION: 12 weeks  PLANNED INTERVENTIONS: 97110-Therapeutic exercises, 97530- Therapeutic activity, 97112- Neuromuscular re-education, 97535- Self Care, [id number]- Manual therapy, [id number]- Electrical stimulation (unattended), 20560 (1-2 muscles), 20561 (3+ muscles)- Dry Needling, Patient/Family education, Cryotherapy, Moist heat, and Biofeedback  PLAN FOR NEXT SESSION:    core engagement;  Channing Pereyra, PT 04/26/2024 4:12 PM   "

## 2024-05-05 ENCOUNTER — Encounter: Payer: Self-pay | Admitting: Physical Therapy

## 2024-05-05 ENCOUNTER — Ambulatory Visit: Admitting: Physical Therapy

## 2024-05-05 DIAGNOSIS — R252 Cramp and spasm: Secondary | ICD-10-CM

## 2024-05-05 DIAGNOSIS — M6281 Muscle weakness (generalized): Secondary | ICD-10-CM

## 2024-05-05 DIAGNOSIS — R102 Pelvic and perineal pain unspecified side: Secondary | ICD-10-CM

## 2024-05-05 NOTE — Therapy (Signed)
 " OUTPATIENT PHYSICAL THERAPY FEMALE PELVIC TREATMENT   Patient Name: Kristine Curtis MRN: [id number] DOB:Mar 28, 1961, 63 y.o., female Today's Date: 05/05/2024     END OF SESSION:  PT End of Session - 05/05/24 1446     Visit Number 12    Date for Recertification  07/07/24    Authorization Type [employer] medicare    Authorization Time Period 04/15/24-06/10/2024    Authorization - Visit Number 3    Authorization - Number of Visits 8    Progress Note Due on Visit 20    PT Start Time 1445    PT Stop Time 1525    PT Time Calculation (min) 40 min    Activity Tolerance Patient tolerated treatment well    Behavior During Therapy [facility] for tasks assessed/performed          Past Medical History:  Diagnosis Date   Allergic rhinitis due to pollen    Anemia    Anxiety    Asthma    Carpal tunnel syndrome of right wrist 12/22/2017   Cataract    Chronic combined systolic and diastolic CHF (congestive heart failure) (HCC)    06/2016 Echo: EF 40-45%, Gr1 DD   Chronic fatigue    Depression    Fall    Family history of polycystic kidney with negative CT 11/16/2012   Fibromyalgia    GERD (gastroesophageal reflux disease)    Helicobacter pylori gastritis 05/2020   Hypertension    Hypothyroidism    IBS (irritable bowel syndrome)    Internal hemorrhoids    Lymphocytic colitis    Morbid obesity (HCC)    NICM (nonischemic cardiomyopathy) (HCC)    a. 1999 Cath: nl cors;  b. EF prev as low as 35%;  c. 11/2015 Echo: Ef 40-45%;  d. 06/2016 Echo: EF 40-45%;  e. Lexiscan  MV: fixed anterosepta/inferseptal defect w/o ischemia, EF 35%.   Osteoarthritis    PONV (postoperative nausea and vomiting)    PVC (premature ventricular contraction)    Restless leg syndrome    Sjogren's syndrome    Vertigo    Past Surgical History:  Procedure Laterality Date   ABDOMINAL HERNIA REPAIR     ABDOMINAL HYSTERECTOMY     CESAREAN SECTION     CHOLECYSTECTOMY  10/2008   COLONOSCOPY  02/14/2015    ESOPHAGOGASTRODUODENOSCOPY     EYE SURGERY     2 2013 and 1981/DCR of right eye 05/2014   LUMBAR DISC SURGERY  2016   L3-4   RIGHT/LEFT HEART CATH AND CORONARY ANGIOGRAPHY N/A 07/11/2016   Procedure: Right/Left Heart Cath and Coronary Angiography;  Surgeon: Peter M Jordan, MD;  Location: [facility] INVASIVE CV LAB;  Service: Cardiovascular;  Laterality: N/A;   SHOULDER ARTHROSCOPY W/ SUBACROMIAL DECOMPRESSION AND DISTAL CLAVICLE EXCISION Right    SPINE SURGERY     C6-C7, 03/2017   TUBAL LIGATION  1992   Patient Active Problem List   Diagnosis Date Noted   Constipation 11/21/2023   Helicobacter pylori gastritis 05/15/2021   Hormone replacement therapy (HRT) 07/29/2019   IBS (irritable bowel syndrome) 11/10/2017   B12 deficiency 11/10/2017   OSA (obstructive sleep apnea) 11/10/2017   Myalgia due to statin 11/10/2017   Lumbar disc disease 10/01/2017   RLS (restless legs syndrome) 10/01/2017   Myofascial pain dysfunction syndrome 07/14/2017   Cervical radiculopathy 07/14/2017   Degenerative arthritis of left knee, XRAY12/2018 07/13/2017   Morbid obesity (HCC) 02/24/2017   Sjogren's disease 02/24/2017   Fibromyalgia 01/21/2017   Chronic low back pain  with sciatica 05/02/2015   Nonischemic cardiomyopathy (HCC) 03/22/2015   Abdominal pain 11/05/2013   Vitamin D  deficiency 10/09/2009   Depression with anxiety 08/31/2008   Essential hypertension 08/31/2008   Allergic rhinitis 08/31/2008   Moderate persistent asthma 08/31/2008   Esophageal reflux 08/31/2008   Acquired hypothyroidism 08/30/2008   Classical migraine without intractable migraine 08/30/2008    PCP: Hughie Sharper, MD  REFERRING PROVIDER: Cayetano Harlene BIRCH, PA-C   REFERRING DIAG: K59.00 (ICD-10-CM) - Constipation, unspecified constipation type   THERAPY DIAG:  Muscle weakness (generalized)  Cramp and spasm  Pelvic pain  Rationale for Evaluation and Treatment: Rehabilitation  ONSET DATE: 10/12/23  SUBJECTIVE:                                                                                                                                                                                            SUBJECTIVE STATEMENT: I had a virus with diarrhea and vomiting. I had some leakage with stool and urine.    FUNCTIONAL LIMITATIONS: none  PERTINENT HISTORY:  Medications for current condition: Estradiol; Linzess ;  Surgeries: Abdominal hernia repair; Abdominal hysterectomy; cesarean section;  Other: Chronic fatigue; Fibromyalgia; hypothyroidism; IBS; Lymphocytic colitis; Sjogren's syndrome; Cholecystectomy; Lumbar disc surgery;  Sexual abuse: No  PAIN:  Are you having pain? No  PRECAUTIONS: None  RED FLAGS: None   WEIGHT BEARING RESTRICTIONS: No  FALLS:  Has patient fallen in last 6 months? Yes. Number of falls 1 off her step but not due to balance  OCCUPATION: disability  ACTIVITY LEVEL : active, does weights, working on walking, flexibility exercise  PLOF: Independent  PATIENT GOALS: reduce leakage, improve constipation, improve pelvic floor health   BOWEL MOVEMENT: Pain with bowel movement: No, since she has been on the Linzess  Type of bowel movement:Type (Bristol Stool Scale) Type 5 and 6 with Linzess , Frequency 1-3 days, Strain no , and Splinting no Fully empty rectum: Yes: with linzess  Leakage: No                                                     Fiber supplement/laxative No  URINATION: Pain with urination: No Fully empty bladder: No; has to lean forward and move around and make a conscious effort                Post-void dribble: when she does not take her time Stream: Strong at first then is weak Urgency: Yes  Frequency:during the day 10-12  Nocturia: Yes: 1    Leakage: Coughing, Sneezing, Laughing, and hold urine too long and bend over Pads/briefs: Yes: if she is going to be someplace without a bathroom then wears a pad  INTERCOURSE:  Ability to have  vaginal penetration No  pain level was 10/10 last time she tried so has stopped.  Pain with intercourse: Initial Penetration, During Penetration, and Deep Penetration Dryness: Yes , stopped intercourse due to vaginal dryness Marinoff Scale: 3/3 Lubricant:yes  PREGNANCY: C-section deliveries 2   PROLAPSE: Pressure and Bulge   OBJECTIVE:  Note: Objective measures were completed at Evaluation unless otherwise noted.  DIAGNOSTIC FINDINGS:  none  PATIENT SURVEYS:  PFIQ-7: 90 UIQ-7: 43 POPIQ-7; 48 03/31/24: PFIQ-7: 67 UIQ-7: 38 POPIQ-7; 29  COGNITION: Overall cognitive status: Within functional limits for tasks assessed     SENSATION: Light touch: Appears intact    FUNCTIONAL TESTS:  Single leg stance:  Rt:3 sec  Lt:2 sec Squat:able to squat   POSTURE: rounded shoulders, forward head, increased thoracic kyphosis, and posterior pelvic tilt   LUMBARAROM/PROM:  A/PROM A/PROM  Eval (% available) 03/31/24 (% available)  Flexion full full  Extension Decreased by 50% full  Right lateral flexion Decreased by 25% full  Left lateral flexion Decreased by 25% 75  Right rotation Decreased by 25% full  Left rotation Decreased by 25% full   (Blank rows = not tested)  LOWER EXTREMITY ROM:  Passive ROM Right eval Left eval Right/left 03/31/24  Hip internal rotation 0 0 10   (Blank rows = not tested)  PALPATION:   Pelvic Alignment: left ASIS is higher  Abdominal: rib cage angle greater than 90 degrees; lifts rib cage to contract abdominal  Diastasis: No Distortion: Yes , increased upper abdominal girth compared to lower  Breathing: chest breather Scar tissue: Yes: cesarean restricted                External Perineal Exam: introitus was small, pink coloring                             Internal Pelvic Floor: tenderness throughout the vaginal canal not able to assess the pelvic floor rectally  Patient confirms identification and approves PT to assess internal  pelvic floor and treatment Yes No emotional/communication barriers or cognitive limitation. Patient is motivated to learn. Patient understands and agrees with treatment goals and plan. PT explains patient will be examined in standing, sitting, and lying down to see how their muscles and joints work. When they are ready, they will be asked to remove their underwear so PT can examine their perineum. The patient is also given the option of providing their own chaperone as one is not provided in our facility. The patient also has the right and is explained the right to defer or refuse any part of the evaluation or treatment including the internal exam. With the patient's consent, PT will use one gloved finger to gently assess the muscles of the pelvic floor, seeing how well it contracts and relaxes and if there is muscle symmetry. After, the patient will get dressed and PT and patient will discuss exam findings and plan of care. PT and patient discuss plan of care, schedule, attendance policy and HEP activities.   PELVIC MMT:   MMT eval 03/24/24 04/14/24 04/26/24  Vaginal 2/5 5/5  5/5  Internal Anal Sphincter Not able to assess due to pain in the rectum with finger in  4/5 5/5  External Anal Sphincter Not able to assess due to pain in the rectum with finger in  4/5 5/5  Puborectalis Not able to assess due to pain in the rectum with finger in  4/5 5/5  Diastasis Recti none  4/5 5/5  (Blank rows = not tested)        TONE: Average tone  PROLAPSE: Not able to assess due to small vaginal opening  TODAY'S TREATMENT: 05/05/24 Exercises: Stretches/mobility: Knee to chest holding 30 sec Pull knee across the trunk holding 30 sec each Butterfly stretch holding 30 sec Supine hamstring stretch with strap holding 30 sec each Supine hip adductor stretch with strap holding 30 sec each Strengthening: Supine pelvic tilt with pelvis on disc to work the abdominals Supine with pelvis on the disc with marching  and keeping the pelvis balanced Supine with pelvis on the disc, pressing the ball between the hands and rotate side to side to work the obliques Supine with pelvis on disc pressing ball between hands and moving it up and down      04/26/24 Manual: Soft tissue mobilization: Manual work to the perineal body and along the external rectal area to elongate Internal pelvic floor techniques: No emotional/communication barriers or cognitive limitation. Patient is motivated to learn. Patient understands and agrees with treatment goals and plan. PT explains patient will be examined in standing, sitting, and lying down to see how their muscles and joints work. When they are ready, they will be asked to remove their underwear so PT can examine their perineum. The patient is also given the option of providing their own chaperone as one is not provided in our facility. The patient also has the right and is explained the right to defer or refuse any part of the evaluation or treatment including the internal exam. With the patient's consent, PT will use one gloved finger to gently assess the muscles of the pelvic floor, seeing how well it contracts and relaxes and if there is muscle symmetry. After, the patient will get dressed and PT and patient will discuss exam findings and plan of care. PT and patient discuss plan of care, schedule, attendance policy and HEP activities.  Therapist gloved finger in the vaginal canal working on the levator ani and obturator internist to elongate tissue and reduce trigger points  Therapist gloved finger in the rectum working on the external anal sphincter, anococcygeal ligament, and puborectalis to reduce trigger points Neuromuscular re-education: Down training: Therapist gloved finger in the vaginal canal feeling the pelvic floor relax with diaphragmatic breathing    04/23/24 Neuromuscular re-education: Core facilitation: Laying on side and moving hands down side to work the  obliques 15 x each  Laying on side flexing hip up and out to work the obliques 15 x each Laying on side hip adductor 20 x each  Exercises: Stretches/mobility: Supine rock knees back and forth 20 x  Single knee to chest holding 30 sec each  2 Figure 4 stretch in supine holding 30 sec x 2 each Butterfly stretch holding 1 min     PATIENT EDUCATION:  03/31/24 Education details: Access Code: [email or web address]; educated on how to have a bowel movement Person educated: Patient Education method: Programmer, Multimedia, Demonstration, Tactile cues, Verbal cues, and Handouts Education comprehension: verbalized understanding, returned demonstration, verbal cues required, tactile cues required, and needs further education  HOME EXERCISE PROGRAM: 03/31/24 Access Code: [email or web address] URL: [email or web address] Date: 03/31/2024 Prepared by: Channing Pereyra  Exercises - Hooklying Transversus Abdominis Palpation  - 1 x daily -  7 x weekly - 1 sets - 10 reps - Supine Bridge  - 1 x daily - 7 x weekly - 1 sets - 10 reps - Seated Single Arm Shoulder Flexion with Dumbbell  - 1 x daily - 7 x weekly - 2 sets - 10 reps - Supine March  - 1 x daily - 3 x weekly - 2 sets - 10 reps - Hooklying Anti-Rotation Press With Anchored Resistance  - 1 x daily - 1 x weekly - 2 sets - 10 reps  Patient Education - Abdominal Massage for Constipation - Abdominal Massage for Constipation  ASSESSMENT:  CLINICAL IMPRESSION: Patient is a 63 y.o. female who was seen today for physical therapy treatment for constipation.  Patient was sick with a virus that caused diarrhea and vomiting so her digestive system is becoming regulated. Patient was able to engage her core bette with pelvis on the disc. Patient had some stool and urine leakage while she was sick. Patient will benefit from skilled therapy to improve pelvic floor coordination and reduce pain and urinary leakage.   OBJECTIVE IMPAIRMENTS: decreased activity tolerance, decreased  coordination, decreased strength, increased fascial restrictions, increased muscle spasms, and pain.   ACTIVITY LIMITATIONS: bending, continence, and toileting  PARTICIPATION LIMITATIONS: shopping and community activity  PERSONAL FACTORS: 1-2 comorbidities: Abdominal hernia repair; Abdominal hysterectomy; cesarean section;  are also affecting patient's functional outcome.   REHAB POTENTIAL: Good  CLINICAL DECISION MAKING: Evolving/moderate complexity  EVALUATION COMPLEXITY: Moderate   GOALS: Goals reviewed with patient? Yes  SHORT TERM GOALS: Target date: 02/17/24  Patient independent with initial HEP for core and pelvic floor strength.  Baseline: Goal status: Met 02/18/24  2.  Patient educated on how to have a bowel movement with correct breathing to relax the pelvic floor and generate force to push the stool out.  Baseline:  Goal status: Met 02/18/24  3.  Patient educated on vaginal moisturizers to improve the health of the tissue due to the Sjogren's and lack of estrogen.  Baseline:  Goal status: Met 02/09/24   LONG TERM GOALS: Target date: 07/07/24  Patient independent with advanced HEP for core and pelvic floor strength to improve continence.  Baseline:  Goal status: ongoing 04/23/24  2.  Patient is able to quickly contract her pelvic floor to stop urinary leakage when she sneezes, and laugh.  Baseline:  Goal status: Ongoing 04/23/24  3.  Patient is able to bend over with a full bladder and engage the core and pelvic floor correctly to prevent urine from leaking.  Baseline: patient reports it is 50% better  Goal status: ongoing 04/23/24  4.  Patient is able to place the largest dilator into the vaginal canal so she is able to have vaginal penetration and vaginal exam.  Baseline: on the 5th dilator Goal status: ongoing 04/23/24    PLAN:  PT FREQUENCY: 1-2x/week  PT DURATION: 12 weeks  PLANNED INTERVENTIONS: 97110-Therapeutic exercises, 97530- Therapeutic  activity, 97112- Neuromuscular re-education, 97535- Self Care, [id number]- Manual therapy, [id number]- Electrical stimulation (unattended), 20560 (1-2 muscles), 20561 (3+ muscles)- Dry Needling, Patient/Family education, Cryotherapy, Moist heat, and Biofeedback  PLAN FOR NEXT SESSION:    core engagement;  Channing Pereyra, PT 05/05/2024 3:38 PM   "

## 2024-05-10 ENCOUNTER — Ambulatory Visit: Attending: Gastroenterology | Admitting: Physical Therapy

## 2024-05-10 ENCOUNTER — Encounter: Payer: Self-pay | Admitting: Physical Therapy

## 2024-05-10 DIAGNOSIS — R102 Pelvic and perineal pain unspecified side: Secondary | ICD-10-CM | POA: Diagnosis present

## 2024-05-10 DIAGNOSIS — R252 Cramp and spasm: Secondary | ICD-10-CM | POA: Insufficient documentation

## 2024-05-10 DIAGNOSIS — M6281 Muscle weakness (generalized): Secondary | ICD-10-CM | POA: Diagnosis present

## 2024-05-10 NOTE — Therapy (Signed)
 " OUTPATIENT PHYSICAL THERAPY FEMALE PELVIC TREATMENT   Patient Name: Kristine Curtis MRN: [id number] DOB:1960-10-26, 64 y.o., female Today's Date: 05/10/2024     END OF SESSION:  PT End of Session - 05/10/24 1020     Visit Number 13    Date for Recertification  07/07/24    Authorization Type [employer] medicare    Authorization Time Period 04/15/24-06/10/2024    Authorization - Visit Number 4    Authorization - Number of Visits 8    Progress Note Due on Visit 20    PT Start Time 1015    PT Stop Time 1055    PT Time Calculation (min) 40 min    Activity Tolerance Patient tolerated treatment well    Behavior During Therapy [facility] for tasks assessed/performed          Past Medical History:  Diagnosis Date   Allergic rhinitis due to pollen    Anemia    Anxiety    Asthma    Carpal tunnel syndrome of right wrist 12/22/2017   Cataract    Chronic combined systolic and diastolic CHF (congestive heart failure) (HCC)    06/2016 Echo: EF 40-45%, Gr1 DD   Chronic fatigue    Depression    Fall    Family history of polycystic kidney with negative CT 11/16/2012   Fibromyalgia    GERD (gastroesophageal reflux disease)    Helicobacter pylori gastritis 05/2020   Hypertension    Hypothyroidism    IBS (irritable bowel syndrome)    Internal hemorrhoids    Lymphocytic colitis    Morbid obesity (HCC)    NICM (nonischemic cardiomyopathy) (HCC)    a. 1999 Cath: nl cors;  b. EF prev as low as 35%;  c. 11/2015 Echo: Ef 40-45%;  d. 06/2016 Echo: EF 40-45%;  e. Lexiscan  MV: fixed anterosepta/inferseptal defect w/o ischemia, EF 35%.   Osteoarthritis    PONV (postoperative nausea and vomiting)    PVC (premature ventricular contraction)    Restless leg syndrome    Sjogren's syndrome    Vertigo    Past Surgical History:  Procedure Laterality Date   ABDOMINAL HERNIA REPAIR     ABDOMINAL HYSTERECTOMY     CESAREAN SECTION     CHOLECYSTECTOMY  10/2008   COLONOSCOPY  02/14/2015    ESOPHAGOGASTRODUODENOSCOPY     EYE SURGERY     2 2013 and 1981/DCR of right eye 05/2014   LUMBAR DISC SURGERY  2016   L3-4   RIGHT/LEFT HEART CATH AND CORONARY ANGIOGRAPHY N/A 07/11/2016   Procedure: Right/Left Heart Cath and Coronary Angiography;  Surgeon: Peter M Jordan, MD;  Location: [facility] INVASIVE CV LAB;  Service: Cardiovascular;  Laterality: N/A;   SHOULDER ARTHROSCOPY W/ SUBACROMIAL DECOMPRESSION AND DISTAL CLAVICLE EXCISION Right    SPINE SURGERY     C6-C7, 03/2017   TUBAL LIGATION  1992   Patient Active Problem List   Diagnosis Date Noted   Constipation 11/21/2023   Helicobacter pylori gastritis 05/15/2021   Hormone replacement therapy (HRT) 07/29/2019   IBS (irritable bowel syndrome) 11/10/2017   B12 deficiency 11/10/2017   OSA (obstructive sleep apnea) 11/10/2017   Myalgia due to statin 11/10/2017   Lumbar disc disease 10/01/2017   RLS (restless legs syndrome) 10/01/2017   Myofascial pain dysfunction syndrome 07/14/2017   Cervical radiculopathy 07/14/2017   Degenerative arthritis of left knee, XRAY12/2018 07/13/2017   Morbid obesity (HCC) 02/24/2017   Sjogren's disease 02/24/2017   Fibromyalgia 01/21/2017   Chronic low back pain  with sciatica 05/02/2015   Nonischemic cardiomyopathy (HCC) 03/22/2015   Abdominal pain 11/05/2013   Vitamin D  deficiency 10/09/2009   Depression with anxiety 08/31/2008   Essential hypertension 08/31/2008   Allergic rhinitis 08/31/2008   Moderate persistent asthma 08/31/2008   Esophageal reflux 08/31/2008   Acquired hypothyroidism 08/30/2008   Classical migraine without intractable migraine 08/30/2008    PCP: Hughie Sharper, MD  REFERRING PROVIDER: Cayetano Harlene BIRCH, PA-C   REFERRING DIAG: K59.00 (ICD-10-CM) - Constipation, unspecified constipation type   THERAPY DIAG:  Muscle weakness (generalized)  Cramp and spasm  Pelvic pain  Rationale for Evaluation and Treatment: Rehabilitation  ONSET DATE: 10/12/23  SUBJECTIVE:                                                                                                                                                                                            SUBJECTIVE STATEMENT: I have had another coupe of days with intestinal issues. I am having  the yeast issues. I have a device that is to help with my breathing to work on diaphragmatic breathing.    FUNCTIONAL LIMITATIONS: none  PERTINENT HISTORY:  Medications for current condition: Estradiol; Linzess ;  Surgeries: Abdominal hernia repair; Abdominal hysterectomy; cesarean section;  Other: Chronic fatigue; Fibromyalgia; hypothyroidism; IBS; Lymphocytic colitis; Sjogren's syndrome; Cholecystectomy; Lumbar disc surgery;  Sexual abuse: No  PAIN:  Are you having pain? No  PRECAUTIONS: None  RED FLAGS: None   WEIGHT BEARING RESTRICTIONS: No  FALLS:  Has patient fallen in last 6 months? Yes. Number of falls 1 off her step but not due to balance  OCCUPATION: disability  ACTIVITY LEVEL : active, does weights, working on walking, flexibility exercise  PLOF: Independent  PATIENT GOALS: reduce leakage, improve constipation, improve pelvic floor health   BOWEL MOVEMENT: Pain with bowel movement: No, since she has been on the Linzess  Type of bowel movement:Type (Bristol Stool Scale) Type 5 and 6 with Linzess , Frequency 1-3 days, Strain no , and Splinting no Fully empty rectum: Yes: with linzess  Leakage: No                                                     Fiber supplement/laxative No  URINATION: Pain with urination: No Fully empty bladder: No; has to lean forward and move around and make a conscious effort                Post-void dribble: when she does not take her time Stream:  Strong at first then is weak Urgency: Yes  Frequency:during the day 10-12                                Nocturia: Yes: 1    Leakage: Coughing, Sneezing, Laughing, and hold urine too long and bend over Pads/briefs: Yes: if she is  going to be someplace without a bathroom then wears a pad  INTERCOURSE:  Ability to have vaginal penetration No  pain level was 10/10 last time she tried so has stopped.  Pain with intercourse: Initial Penetration, During Penetration, and Deep Penetration Dryness: Yes , stopped intercourse due to vaginal dryness Marinoff Scale: 3/3 Lubricant:yes  PREGNANCY: C-section deliveries 2   PROLAPSE: Pressure and Bulge   OBJECTIVE:  Note: Objective measures were completed at Evaluation unless otherwise noted.  DIAGNOSTIC FINDINGS:  none  PATIENT SURVEYS:  PFIQ-7: 90 UIQ-7: 43 POPIQ-7; 48 03/31/24: PFIQ-7: 67 UIQ-7: 38 POPIQ-7; 29  COGNITION: Overall cognitive status: Within functional limits for tasks assessed     SENSATION: Light touch: Appears intact    FUNCTIONAL TESTS:  Single leg stance:  Rt:3 sec  Lt:2 sec Squat:able to squat   POSTURE: rounded shoulders, forward head, increased thoracic kyphosis, and posterior pelvic tilt   LUMBARAROM/PROM:  A/PROM A/PROM  Eval (% available) 03/31/24 (% available)  Flexion full full  Extension Decreased by 50% full  Right lateral flexion Decreased by 25% full  Left lateral flexion Decreased by 25% 75  Right rotation Decreased by 25% full  Left rotation Decreased by 25% full   (Blank rows = not tested)  LOWER EXTREMITY ROM:  Passive ROM Right eval Left eval Right/left 03/31/24  Hip internal rotation 0 0 10   (Blank rows = not tested)  PALPATION:   Pelvic Alignment: left ASIS is higher  Abdominal: rib cage angle greater than 90 degrees; lifts rib cage to contract abdominal  Diastasis: No Distortion: Yes , increased upper abdominal girth compared to lower  Breathing: chest breather Scar tissue: Yes: cesarean restricted                External Perineal Exam: introitus was small, pink coloring                             Internal Pelvic Floor: tenderness throughout the vaginal canal not able to assess the  pelvic floor rectally  Patient confirms identification and approves PT to assess internal pelvic floor and treatment Yes No emotional/communication barriers or cognitive limitation. Patient is motivated to learn. Patient understands and agrees with treatment goals and plan. PT explains patient will be examined in standing, sitting, and lying down to see how their muscles and joints work. When they are ready, they will be asked to remove their underwear so PT can examine their perineum. The patient is also given the option of providing their own chaperone as one is not provided in our facility. The patient also has the right and is explained the right to defer or refuse any part of the evaluation or treatment including the internal exam. With the patient's consent, PT will use one gloved finger to gently assess the muscles of the pelvic floor, seeing how well it contracts and relaxes and if there is muscle symmetry. After, the patient will get dressed and PT and patient will discuss exam findings and plan of care. PT and patient discuss plan of  care, schedule, attendance policy and HEP activities.   PELVIC MMT:   MMT eval 03/24/24 04/14/24 04/26/24  Vaginal 2/5 5/5  5/5  Internal Anal Sphincter Not able to assess due to pain in the rectum with finger in  4/5 5/5  External Anal Sphincter Not able to assess due to pain in the rectum with finger in  4/5 5/5  Puborectalis Not able to assess due to pain in the rectum with finger in  4/5 5/5  Diastasis Recti none  4/5 5/5  (Blank rows = not tested)        TONE: Average tone  PROLAPSE: Not able to assess due to small vaginal opening  TODAY'S TREATMENT: 05/10/24 Neuromuscular re-education: Down training: Sitting diaphragmatic breathing with her breathing device and able to bulge the abdomen and contracts the abdomen with breathing out Exercises: Stretches/mobility: Supine hamstring stretch with strap holding 30 sec each Supine hip adductor stretch  with strap holding 30 sec each Pull knee across the trunk holding 30 sec each Piriformis stretch with strap in supine holding 30 sec each Strengthening: Supine pull band to the side to work the obliques 20 x each with hips on disc and ball squeeze Supine bilateral shoulder extension with green band with bridge 20 x    05/05/24 Exercises: Stretches/mobility: Knee to chest holding 30 sec Pull knee across the trunk holding 30 sec each Butterfly stretch holding 30 sec Supine hamstring stretch with strap holding 30 sec each Supine hip adductor stretch with strap holding 30 sec each Strengthening: Supine pelvic tilt with pelvis on disc to work the abdominals Supine with pelvis on the disc with marching and keeping the pelvis balanced Supine with pelvis on the disc, pressing the ball between the hands and rotate side to side to work the obliques Supine with pelvis on disc pressing ball between hands and moving it up and down      04/26/24 Manual: Soft tissue mobilization: Manual work to the perineal body and along the external rectal area to elongate Internal pelvic floor techniques: No emotional/communication barriers or cognitive limitation. Patient is motivated to learn. Patient understands and agrees with treatment goals and plan. PT explains patient will be examined in standing, sitting, and lying down to see how their muscles and joints work. When they are ready, they will be asked to remove their underwear so PT can examine their perineum. The patient is also given the option of providing their own chaperone as one is not provided in our facility. The patient also has the right and is explained the right to defer or refuse any part of the evaluation or treatment including the internal exam. With the patient's consent, PT will use one gloved finger to gently assess the muscles of the pelvic floor, seeing how well it contracts and relaxes and if there is muscle symmetry. After, the patient  will get dressed and PT and patient will discuss exam findings and plan of care. PT and patient discuss plan of care, schedule, attendance policy and HEP activities.  Therapist gloved finger in the vaginal canal working on the levator ani and obturator internist to elongate tissue and reduce trigger points  Therapist gloved finger in the rectum working on the external anal sphincter, anococcygeal ligament, and puborectalis to reduce trigger points Neuromuscular re-education: Down training: Therapist gloved finger in the vaginal canal feeling the pelvic floor relax with diaphragmatic breathing    PATIENT EDUCATION:  03/31/24 Education details: Access Code: [email or web address]; educated on how to have a  bowel movement Person educated: Patient Education method: Explanation, Demonstration, Tactile cues, Verbal cues, and Handouts Education comprehension: verbalized understanding, returned demonstration, verbal cues required, tactile cues required, and needs further education  HOME EXERCISE PROGRAM: 03/31/24 Access Code: [email or web address] URL: [email or web address] Date: 03/31/2024 Prepared by: Channing Pereyra  Exercises - Hooklying Transversus Abdominis Palpation  - 1 x daily - 7 x weekly - 1 sets - 10 reps - Supine Bridge  - 1 x daily - 7 x weekly - 1 sets - 10 reps - Seated Single Arm Shoulder Flexion with Dumbbell  - 1 x daily - 7 x weekly - 2 sets - 10 reps - Supine March  - 1 x daily - 3 x weekly - 2 sets - 10 reps - Hooklying Anti-Rotation Press With Anchored Resistance  - 1 x daily - 1 x weekly - 2 sets - 10 reps  Patient Education - Abdominal Massage for Constipation - Abdominal Massage for Constipation  ASSESSMENT:  CLINICAL IMPRESSION: Patient is a 64 y.o. female who was seen today for physical therapy treatment for constipation.  Urinary leakage is 75% better. She continues to have a yeast infection so no internal work is to be done. Patient is not running to the bathroom every 15  minutes.  Patient has not been using the dilator for over a  week due to the yeast infection.  Patient will benefit from skilled therapy to improve pelvic floor coordination and reduce pain and urinary leakage.   OBJECTIVE IMPAIRMENTS: decreased activity tolerance, decreased coordination, decreased strength, increased fascial restrictions, increased muscle spasms, and pain.   ACTIVITY LIMITATIONS: bending, continence, and toileting  PARTICIPATION LIMITATIONS: shopping and community activity  PERSONAL FACTORS: 1-2 comorbidities: Abdominal hernia repair; Abdominal hysterectomy; cesarean section;  are also affecting patient's functional outcome.   REHAB POTENTIAL: Good  CLINICAL DECISION MAKING: Evolving/moderate complexity  EVALUATION COMPLEXITY: Moderate   GOALS: Goals reviewed with patient? Yes  SHORT TERM GOALS: Target date: 02/17/24  Patient independent with initial HEP for core and pelvic floor strength.  Baseline: Goal status: Met 02/18/24  2.  Patient educated on how to have a bowel movement with correct breathing to relax the pelvic floor and generate force to push the stool out.  Baseline:  Goal status: Met 02/18/24  3.  Patient educated on vaginal moisturizers to improve the health of the tissue due to the Sjogren's and lack of estrogen.  Baseline:  Goal status: Met 02/09/24   LONG TERM GOALS: Target date: 07/07/24  Patient independent with advanced HEP for core and pelvic floor strength to improve continence.  Baseline:  Goal status: ongoing 04/23/24  2.  Patient is able to quickly contract her pelvic floor to stop urinary leakage when she sneezes, and laugh.  Baseline:  Goal status: Ongoing 04/23/24  3.  Patient is able to bend over with a full bladder and engage the core and pelvic floor correctly to prevent urine from leaking.  Baseline: patient reports it is 50% better  Goal status: ongoing 04/23/24  4.  Patient is able to place the largest dilator into the  vaginal canal so she is able to have vaginal penetration and vaginal exam.  Baseline: on the 5th dilator Goal status: ongoing 04/23/24    PLAN:  PT FREQUENCY: 1-2x/week  PT DURATION: 12 weeks  PLANNED INTERVENTIONS: 97110-Therapeutic exercises, 97530- Therapeutic activity, 97112- Neuromuscular re-education, 97535- Self Care, [id number]- Manual therapy, [id number]- Electrical stimulation (unattended), 20560 (1-2 muscles), 20561 (3+ muscles)- Dry Needling, Patient/Family education, Cryotherapy, Moist heat, and  Biofeedback  PLAN FOR NEXT SESSION:    core engagement;  Channing Pereyra, PT 05/10/2024 11:01 AM   "

## 2024-05-14 ENCOUNTER — Other Ambulatory Visit: Payer: Self-pay | Admitting: Gastroenterology

## 2024-05-17 ENCOUNTER — Ambulatory Visit: Admitting: Physical Therapy

## 2024-05-17 ENCOUNTER — Encounter: Payer: Self-pay | Admitting: Physical Therapy

## 2024-05-17 DIAGNOSIS — R102 Pelvic and perineal pain unspecified side: Secondary | ICD-10-CM

## 2024-05-17 DIAGNOSIS — M6281 Muscle weakness (generalized): Secondary | ICD-10-CM | POA: Diagnosis not present

## 2024-05-17 DIAGNOSIS — R252 Cramp and spasm: Secondary | ICD-10-CM

## 2024-05-17 NOTE — Therapy (Signed)
 " OUTPATIENT PHYSICAL THERAPY FEMALE PELVIC TREATMENT   Patient Name: ELLEANNA MELLING MRN: [id number] DOB:Dec 06, 1960, 64 y.o., female Today's Date: 05/17/2024     END OF SESSION:  PT End of Session - 05/17/24 1455     Visit Number 14    Date for Recertification  07/07/24    Authorization Type [employer] medicare    Authorization Time Period 04/15/24-06/10/2024    Authorization - Visit Number 5    Authorization - Number of Visits 8    Progress Note Due on Visit 20    PT Start Time 1455   late   PT Stop Time 1525    PT Time Calculation (min) 30 min    Activity Tolerance Patient tolerated treatment well    Behavior During Therapy [facility] for tasks assessed/performed          Past Medical History:  Diagnosis Date   Allergic rhinitis due to pollen    Anemia    Anxiety    Asthma    Carpal tunnel syndrome of right wrist 12/22/2017   Cataract    Chronic combined systolic and diastolic CHF (congestive heart failure) (HCC)    06/2016 Echo: EF 40-45%, Gr1 DD   Chronic fatigue    Depression    Fall    Family history of polycystic kidney with negative CT 11/16/2012   Fibromyalgia    GERD (gastroesophageal reflux disease)    Helicobacter pylori gastritis 05/2020   Hypertension    Hypothyroidism    IBS (irritable bowel syndrome)    Internal hemorrhoids    Lymphocytic colitis    Morbid obesity (HCC)    NICM (nonischemic cardiomyopathy) (HCC)    a. 1999 Cath: nl cors;  b. EF prev as low as 35%;  c. 11/2015 Echo: Ef 40-45%;  d. 06/2016 Echo: EF 40-45%;  e. Lexiscan  MV: fixed anterosepta/inferseptal defect w/o ischemia, EF 35%.   Osteoarthritis    PONV (postoperative nausea and vomiting)    PVC (premature ventricular contraction)    Restless leg syndrome    Sjogren's syndrome    Vertigo    Past Surgical History:  Procedure Laterality Date   ABDOMINAL HERNIA REPAIR     ABDOMINAL HYSTERECTOMY     CESAREAN SECTION     CHOLECYSTECTOMY  10/2008   COLONOSCOPY  02/14/2015    ESOPHAGOGASTRODUODENOSCOPY     EYE SURGERY     2 2013 and 1981/DCR of right eye 05/2014   LUMBAR DISC SURGERY  2016   L3-4   RIGHT/LEFT HEART CATH AND CORONARY ANGIOGRAPHY N/A 07/11/2016   Procedure: Right/Left Heart Cath and Coronary Angiography;  Surgeon: Peter M Jordan, MD;  Location: [facility] INVASIVE CV LAB;  Service: Cardiovascular;  Laterality: N/A;   SHOULDER ARTHROSCOPY W/ SUBACROMIAL DECOMPRESSION AND DISTAL CLAVICLE EXCISION Right    SPINE SURGERY     C6-C7, 03/2017   TUBAL LIGATION  1992   Patient Active Problem List   Diagnosis Date Noted   Constipation 11/21/2023   Helicobacter pylori gastritis 05/15/2021   Hormone replacement therapy (HRT) 07/29/2019   IBS (irritable bowel syndrome) 11/10/2017   B12 deficiency 11/10/2017   OSA (obstructive sleep apnea) 11/10/2017   Myalgia due to statin 11/10/2017   Lumbar disc disease 10/01/2017   RLS (restless legs syndrome) 10/01/2017   Myofascial pain dysfunction syndrome 07/14/2017   Cervical radiculopathy 07/14/2017   Degenerative arthritis of left knee, XRAY12/2018 07/13/2017   Morbid obesity (HCC) 02/24/2017   Sjogren's disease 02/24/2017   Fibromyalgia 01/21/2017   Chronic low  back pain with sciatica 05/02/2015   Nonischemic cardiomyopathy (HCC) 03/22/2015   Abdominal pain 11/05/2013   Vitamin D  deficiency 10/09/2009   Depression with anxiety 08/31/2008   Essential hypertension 08/31/2008   Allergic rhinitis 08/31/2008   Moderate persistent asthma 08/31/2008   Esophageal reflux 08/31/2008   Acquired hypothyroidism 08/30/2008   Classical migraine without intractable migraine 08/30/2008    PCP: Hughie Sharper, MD  REFERRING PROVIDER: Cayetano Harlene BIRCH, PA-C   REFERRING DIAG: K59.00 (ICD-10-CM) - Constipation, unspecified constipation type   THERAPY DIAG:  Muscle weakness (generalized)  Cramp and spasm  Pelvic pain  Rationale for Evaluation and Treatment: Rehabilitation  ONSET DATE: 10/12/23  SUBJECTIVE:                                                                                                                                                                                            SUBJECTIVE STATEMENT: I am sore all over.    FUNCTIONAL LIMITATIONS: none  PERTINENT HISTORY:  Medications for current condition: Estradiol; Linzess ;  Surgeries: Abdominal hernia repair; Abdominal hysterectomy; cesarean section;  Other: Chronic fatigue; Fibromyalgia; hypothyroidism; IBS; Lymphocytic colitis; Sjogren's syndrome; Cholecystectomy; Lumbar disc surgery;  Sexual abuse: No  PAIN:  Are you having pain? No  PRECAUTIONS: None  RED FLAGS: None   WEIGHT BEARING RESTRICTIONS: No  FALLS:  Has patient fallen in last 6 months? Yes. Number of falls 1 off her step but not due to balance  OCCUPATION: disability  ACTIVITY LEVEL : active, does weights, working on walking, flexibility exercise  PLOF: Independent  PATIENT GOALS: reduce leakage, improve constipation, improve pelvic floor health   BOWEL MOVEMENT: Pain with bowel movement: No, since she has been on the Linzess  Type of bowel movement:Type (Bristol Stool Scale) Type 5 and 6 with Linzess , Frequency 1-3 days, Strain no , and Splinting no Fully empty rectum: Yes: with linzess  Leakage: No                                                     Fiber supplement/laxative No  URINATION: Pain with urination: No Fully empty bladder: No; has to lean forward and move around and make a conscious effort                Post-void dribble: when she does not take her time Stream: Strong at first then is weak Urgency: Yes  Frequency:during the day 10-12  Nocturia: Yes: 1    Leakage: Coughing, Sneezing, Laughing, and hold urine too long and bend over Pads/briefs: Yes: if she is going to be someplace without a bathroom then wears a pad  INTERCOURSE:  Ability to have vaginal penetration No  pain level was 10/10 last time she tried  so has stopped.  Pain with intercourse: Initial Penetration, During Penetration, and Deep Penetration Dryness: Yes , stopped intercourse due to vaginal dryness Marinoff Scale: 3/3 Lubricant:yes  PREGNANCY: C-section deliveries 2   PROLAPSE: Pressure and Bulge   OBJECTIVE:  Note: Objective measures were completed at Evaluation unless otherwise noted.  DIAGNOSTIC FINDINGS:  none  PATIENT SURVEYS:  PFIQ-7: 90 UIQ-7: 43 POPIQ-7; 48 03/31/24: PFIQ-7: 67 UIQ-7: 38 POPIQ-7; 29  COGNITION: Overall cognitive status: Within functional limits for tasks assessed     SENSATION: Light touch: Appears intact    FUNCTIONAL TESTS:  Single leg stance:  Rt:3 sec  Lt:2 sec Squat:able to squat   POSTURE: rounded shoulders, forward head, increased thoracic kyphosis, and posterior pelvic tilt   LUMBARAROM/PROM:  A/PROM A/PROM  Eval (% available) 03/31/24 (% available)  Flexion full full  Extension Decreased by 50% full  Right lateral flexion Decreased by 25% full  Left lateral flexion Decreased by 25% 75  Right rotation Decreased by 25% full  Left rotation Decreased by 25% full   (Blank rows = not tested)  LOWER EXTREMITY ROM:  Passive ROM Right eval Left eval Right/left 03/31/24  Hip internal rotation 0 0 10   (Blank rows = not tested)  PALPATION:   Pelvic Alignment: left ASIS is higher  Abdominal: rib cage angle greater than 90 degrees; lifts rib cage to contract abdominal  Diastasis: No Distortion: Yes , increased upper abdominal girth compared to lower  Breathing: chest breather Scar tissue: Yes: cesarean restricted                External Perineal Exam: introitus was small, pink coloring                             Internal Pelvic Floor: tenderness throughout the vaginal canal not able to assess the pelvic floor rectally  Patient confirms identification and approves PT to assess internal pelvic floor and treatment Yes No emotional/communication  barriers or cognitive limitation. Patient is motivated to learn. Patient understands and agrees with treatment goals and plan. PT explains patient will be examined in standing, sitting, and lying down to see how their muscles and joints work. When they are ready, they will be asked to remove their underwear so PT can examine their perineum. The patient is also given the option of providing their own chaperone as one is not provided in our facility. The patient also has the right and is explained the right to defer or refuse any part of the evaluation or treatment including the internal exam. With the patient's consent, PT will use one gloved finger to gently assess the muscles of the pelvic floor, seeing how well it contracts and relaxes and if there is muscle symmetry. After, the patient will get dressed and PT and patient will discuss exam findings and plan of care. PT and patient discuss plan of care, schedule, attendance policy and HEP activities.   PELVIC MMT:   MMT eval 03/24/24 04/14/24 04/26/24 05/17/24  Vaginal 2/5 5/5  5/5 5/5  Internal Anal Sphincter Not able to assess due to pain in the rectum with finger in  4/5 5/5   External Anal Sphincter Not able to assess due to pain in the rectum with finger in  4/5 5/5   Puborectalis Not able to assess due to pain in the rectum with finger in  4/5 5/5   Diastasis Recti none  4/5 5/5   (Blank rows = not tested)        TONE: Average tone  PROLAPSE: Not able to assess due to small vaginal opening  TODAY'S TREATMENT: 05/17/24 Manual: Internal pelvic floor techniques: No emotional/communication barriers or cognitive limitation. Patient is motivated to learn. Patient understands and agrees with treatment goals and plan. PT explains patient will be examined in standing, sitting, and lying down to see how their muscles and joints work. When they are ready, they will be asked to remove their underwear so PT can examine their perineum. The patient is  also given the option of providing their own chaperone as one is not provided in our facility. The patient also has the right and is explained the right to defer or refuse any part of the evaluation or treatment including the internal exam. With the patient's consent, PT will use one gloved finger to gently assess the muscles of the pelvic floor, seeing how well it contracts and relaxes and if there is muscle symmetry. After, the patient will get dressed and PT and patient will discuss exam findings and plan of care. PT and patient discuss plan of care, schedule, attendance policy and HEP activities.  Therapist gloved finger in the vaginal canal working on the levator ani and obturator internist to elongate the tissue  Exercises: Stretches/mobility: Supine rock knees back and forth 10 x  Single knee to chest holding 30 sec 2 x  Supine hamstring stretch holding 30 sec 2 x  Supine ITB with strap holding 30 sec bil.     05/10/24 Neuromuscular re-education: Down training: Sitting diaphragmatic breathing with her breathing device and able to bulge the abdomen and contracts the abdomen with breathing out Exercises: Stretches/mobility: Supine hamstring stretch with strap holding 30 sec each Supine hip adductor stretch with strap holding 30 sec each Pull knee across the trunk holding 30 sec each Piriformis stretch with strap in supine holding 30 sec each Strengthening: Supine pull band to the side to work the obliques 20 x each with hips on disc and ball squeeze Supine bilateral shoulder extension with green band with bridge 20 x    05/05/24 Exercises: Stretches/mobility: Knee to chest holding 30 sec Pull knee across the trunk holding 30 sec each Butterfly stretch holding 30 sec Supine hamstring stretch with strap holding 30 sec each Supine hip adductor stretch with strap holding 30 sec each Strengthening: Supine pelvic tilt with pelvis on disc to work the abdominals Supine with pelvis on  the disc with marching and keeping the pelvis balanced Supine with pelvis on the disc, pressing the ball between the hands and rotate side to side to work the obliques Supine with pelvis on disc pressing ball between hands and moving it up and down       PATIENT EDUCATION:  03/31/24 Education details: Access Code: [email or web address]; educated on how to have a bowel movement Person educated: Patient Education method: Programmer, Multimedia, Demonstration, Actor cues, Verbal cues, and Handouts Education comprehension: verbalized understanding, returned demonstration, verbal cues required, tactile cues required, and needs further education  HOME EXERCISE PROGRAM: 03/31/24 Access Code: [email or web address] URL: [email or web address] Date: 03/31/2024 Prepared by: Channing Pereyra  Exercises - Hooklying Transversus Abdominis Palpation  - 1  x daily - 7 x weekly - 1 sets - 10 reps - Supine Bridge  - 1 x daily - 7 x weekly - 1 sets - 10 reps - Seated Single Arm Shoulder Flexion with Dumbbell  - 1 x daily - 7 x weekly - 2 sets - 10 reps - Supine March  - 1 x daily - 3 x weekly - 2 sets - 10 reps - Hooklying Anti-Rotation Press With Anchored Resistance  - 1 x daily - 1 x weekly - 2 sets - 10 reps  Patient Education - Abdominal Massage for Constipation - Abdominal Massage for Constipation  ASSESSMENT:  CLINICAL IMPRESSION: Patient is a 64 y.o. female who was seen today for physical therapy treatment for constipation.  Patient is on the 6th dilator. The yeast infection is gone. Pelvic floor strength is 5/5. She has less tightness along the left pelvic floor muscles. Patient was sore all over so she was slow with her movement today.  Patient can bend over with a squat with 70% less leakage. Patient will start using the dilator again due to the yeast infection is resolved .Patient will benefit from skilled therapy to improve pelvic floor coordination and reduce pain and urinary leakage.   OBJECTIVE IMPAIRMENTS:  decreased activity tolerance, decreased coordination, decreased strength, increased fascial restrictions, increased muscle spasms, and pain.   ACTIVITY LIMITATIONS: bending, continence, and toileting  PARTICIPATION LIMITATIONS: shopping and community activity  PERSONAL FACTORS: 1-2 comorbidities: Abdominal hernia repair; Abdominal hysterectomy; cesarean section;  are also affecting patient's functional outcome.   REHAB POTENTIAL: Good  CLINICAL DECISION MAKING: Evolving/moderate complexity  EVALUATION COMPLEXITY: Moderate   GOALS: Goals reviewed with patient? Yes  SHORT TERM GOALS: Target date: 02/17/24  Patient independent with initial HEP for core and pelvic floor strength.  Baseline: Goal status: Met 02/18/24  2.  Patient educated on how to have a bowel movement with correct breathing to relax the pelvic floor and generate force to push the stool out.  Baseline:  Goal status: Met 02/18/24  3.  Patient educated on vaginal moisturizers to improve the health of the tissue due to the Sjogren's and lack of estrogen.  Baseline:  Goal status: Met 02/09/24   LONG TERM GOALS: Target date: 07/07/24  Patient independent with advanced HEP for core and pelvic floor strength to improve continence.  Baseline:  Goal status: ongoing 04/23/24  2.  Patient is able to quickly contract her pelvic floor to stop urinary leakage when she sneezes, and laugh.  Baseline:  Goal status: Ongoing 04/23/24  3.  Patient is able to bend over with a full bladder and engage the core and pelvic floor correctly to prevent urine from leaking.  Baseline: patient reports it is 80% better  Goal status: ongoing 04/23/24  4.  Patient is able to place the largest dilator into the vaginal canal so she is able to have vaginal penetration and vaginal exam.  Baseline: on the 5th dilator Goal status: ongoing 04/23/24    PLAN:  PT FREQUENCY: 1-2x/week  PT DURATION: 12 weeks  PLANNED INTERVENTIONS:  97110-Therapeutic exercises, 97530- Therapeutic activity, 97112- Neuromuscular re-education, 97535- Self Care, [id number]- Manual therapy, [id number]- Electrical stimulation (unattended), 20560 (1-2 muscles), 20561 (3+ muscles)- Dry Needling, Patient/Family education, Cryotherapy, Moist heat, and Biofeedback  PLAN FOR NEXT SESSION:    core engagement;work on squatting with leaning forward  Channing Pereyra, PT 05/17/2024 3:29 PM   "

## 2024-05-26 ENCOUNTER — Ambulatory Visit: Admitting: Physical Therapy

## 2024-05-26 ENCOUNTER — Encounter: Payer: Self-pay | Admitting: Physical Therapy

## 2024-05-26 DIAGNOSIS — M6281 Muscle weakness (generalized): Secondary | ICD-10-CM

## 2024-05-26 DIAGNOSIS — R102 Pelvic and perineal pain unspecified side: Secondary | ICD-10-CM

## 2024-05-26 DIAGNOSIS — R252 Cramp and spasm: Secondary | ICD-10-CM

## 2024-05-26 NOTE — Therapy (Signed)
 " OUTPATIENT PHYSICAL THERAPY FEMALE PELVIC TREATMENT   Patient Name: Kristine Curtis MRN: [id number] DOB:1961/01/10, 64 y.o., female Today's Date: 05/26/2024     END OF SESSION:  PT End of Session - 05/26/24 1449     Visit Number 15    Date for Recertification  07/07/24    Authorization Type [employer] medicare    Authorization Time Period 04/15/24-06/10/2024    Authorization - Visit Number 6    Authorization - Number of Visits 8    Progress Note Due on Visit 20    PT Start Time 1445    PT Stop Time 1525    PT Time Calculation (min) 40 min    Activity Tolerance Patient tolerated treatment well    Behavior During Therapy [facility] for tasks assessed/performed          Past Medical History:  Diagnosis Date   Allergic rhinitis due to pollen    Anemia    Anxiety    Asthma    Carpal tunnel syndrome of right wrist 12/22/2017   Cataract    Chronic combined systolic and diastolic CHF (congestive heart failure) (HCC)    06/2016 Echo: EF 40-45%, Gr1 DD   Chronic fatigue    Depression    Fall    Family history of polycystic kidney with negative CT 11/16/2012   Fibromyalgia    GERD (gastroesophageal reflux disease)    Helicobacter pylori gastritis 05/2020   Hypertension    Hypothyroidism    IBS (irritable bowel syndrome)    Internal hemorrhoids    Lymphocytic colitis    Morbid obesity (HCC)    NICM (nonischemic cardiomyopathy) (HCC)    a. 1999 Cath: nl cors;  b. EF prev as low as 35%;  c. 11/2015 Echo: Ef 40-45%;  d. 06/2016 Echo: EF 40-45%;  e. Lexiscan  MV: fixed anterosepta/inferseptal defect w/o ischemia, EF 35%.   Osteoarthritis    PONV (postoperative nausea and vomiting)    PVC (premature ventricular contraction)    Restless leg syndrome    Sjogren's syndrome    Vertigo    Past Surgical History:  Procedure Laterality Date   ABDOMINAL HERNIA REPAIR     ABDOMINAL HYSTERECTOMY     CESAREAN SECTION     CHOLECYSTECTOMY  10/2008   COLONOSCOPY  02/14/2015    ESOPHAGOGASTRODUODENOSCOPY     EYE SURGERY     2 2013 and 1981/DCR of right eye 05/2014   LUMBAR DISC SURGERY  2016   L3-4   RIGHT/LEFT HEART CATH AND CORONARY ANGIOGRAPHY N/A 07/11/2016   Procedure: Right/Left Heart Cath and Coronary Angiography;  Surgeon: Peter M Jordan, MD;  Location: [facility] INVASIVE CV LAB;  Service: Cardiovascular;  Laterality: N/A;   SHOULDER ARTHROSCOPY W/ SUBACROMIAL DECOMPRESSION AND DISTAL CLAVICLE EXCISION Right    SPINE SURGERY     C6-C7, 03/2017   TUBAL LIGATION  1992   Patient Active Problem List   Diagnosis Date Noted   Constipation 11/21/2023   Helicobacter pylori gastritis 05/15/2021   Hormone replacement therapy (HRT) 07/29/2019   IBS (irritable bowel syndrome) 11/10/2017   B12 deficiency 11/10/2017   OSA (obstructive sleep apnea) 11/10/2017   Myalgia due to statin 11/10/2017   Lumbar disc disease 10/01/2017   RLS (restless legs syndrome) 10/01/2017   Myofascial pain dysfunction syndrome 07/14/2017   Cervical radiculopathy 07/14/2017   Degenerative arthritis of left knee, XRAY12/2018 07/13/2017   Morbid obesity (HCC) 02/24/2017   Sjogren's disease 02/24/2017   Fibromyalgia 01/21/2017   Chronic low back pain  with sciatica 05/02/2015   Nonischemic cardiomyopathy (HCC) 03/22/2015   Abdominal pain 11/05/2013   Vitamin D  deficiency 10/09/2009   Depression with anxiety 08/31/2008   Essential hypertension 08/31/2008   Allergic rhinitis 08/31/2008   Moderate persistent asthma 08/31/2008   Esophageal reflux 08/31/2008   Acquired hypothyroidism 08/30/2008   Classical migraine without intractable migraine 08/30/2008    PCP: Hughie Sharper, MD  REFERRING PROVIDER: Cayetano Harlene BIRCH, PA-C   REFERRING DIAG: K59.00 (ICD-10-CM) - Constipation, unspecified constipation type   THERAPY DIAG:  Muscle weakness (generalized)  Cramp and spasm  Pelvic pain  Rationale for Evaluation and Treatment: Rehabilitation  ONSET DATE: 10/12/23  SUBJECTIVE:                                                                                                                                                                                            SUBJECTIVE STATEMENT: I had the Norovirus for several days.  I have been 7 days without [employer].    FUNCTIONAL LIMITATIONS: none  PERTINENT HISTORY:  Medications for current condition: Estradiol; Linzess ;  Surgeries: Abdominal hernia repair; Abdominal hysterectomy; cesarean section;  Other: Chronic fatigue; Fibromyalgia; hypothyroidism; IBS; Lymphocytic colitis; Sjogren's syndrome; Cholecystectomy; Lumbar disc surgery;  Sexual abuse: No  PAIN:  Are you having pain? No  PRECAUTIONS: None  RED FLAGS: None   WEIGHT BEARING RESTRICTIONS: No  FALLS:  Has patient fallen in last 6 months? Yes. Number of falls 1 off her step but not due to balance  OCCUPATION: disability  ACTIVITY LEVEL : active, does weights, working on walking, flexibility exercise  PLOF: Independent  PATIENT GOALS: reduce leakage, improve constipation, improve pelvic floor health   BOWEL MOVEMENT: Pain with bowel movement: No, since she has been on the Linzess  Type of bowel movement:Type (Bristol Stool Scale) Type 5 and 6 with Linzess , Frequency 1-3 days, Strain no , and Splinting no Fully empty rectum: Yes: with linzess  Leakage: No                                                     Fiber supplement/laxative No  URINATION: Pain with urination: No Fully empty bladder: No; has to lean forward and move around and make a conscious effort                Post-void dribble: when she does not take her time Stream: Strong at first then is weak Urgency: Yes  Frequency:during the day 10-12  Nocturia: Yes: 1    Leakage: Coughing, Sneezing, Laughing, and hold urine too long and bend over Pads/briefs: Yes: if she is going to be someplace without a bathroom then wears a pad  INTERCOURSE:  Ability to have vaginal  penetration No  pain level was 10/10 last time she tried so has stopped.  Pain with intercourse: Initial Penetration, During Penetration, and Deep Penetration Dryness: Yes , stopped intercourse due to vaginal dryness Marinoff Scale: 3/3 Lubricant:yes  PREGNANCY: C-section deliveries 2   PROLAPSE: Pressure and Bulge   OBJECTIVE:  Note: Objective measures were completed at Evaluation unless otherwise noted.  DIAGNOSTIC FINDINGS:  none  PATIENT SURVEYS:  PFIQ-7: 90 UIQ-7: 43 POPIQ-7; 48 03/31/24: PFIQ-7: 67 UIQ-7: 38 POPIQ-7; 29  COGNITION: Overall cognitive status: Within functional limits for tasks assessed     SENSATION: Light touch: Appears intact    FUNCTIONAL TESTS:  Single leg stance:  Rt:3 sec  Lt:2 sec Squat:able to squat   POSTURE: rounded shoulders, forward head, increased thoracic kyphosis, and posterior pelvic tilt   LUMBARAROM/PROM:  A/PROM A/PROM  Eval (% available) 03/31/24 (% available)  Flexion full full  Extension Decreased by 50% full  Right lateral flexion Decreased by 25% full  Left lateral flexion Decreased by 25% 75  Right rotation Decreased by 25% full  Left rotation Decreased by 25% full   (Blank rows = not tested)  LOWER EXTREMITY ROM:  Passive ROM Right eval Left eval Right/left 03/31/24  Hip internal rotation 0 0 10   (Blank rows = not tested)  PALPATION:   Pelvic Alignment: left ASIS is higher  Abdominal: rib cage angle greater than 90 degrees; lifts rib cage to contract abdominal  Diastasis: No Distortion: Yes , increased upper abdominal girth compared to lower  Breathing: chest breather Scar tissue: Yes: cesarean restricted                External Perineal Exam: introitus was small, pink coloring                             Internal Pelvic Floor: tenderness throughout the vaginal canal not able to assess the pelvic floor rectally  Patient confirms identification and approves PT to assess internal pelvic  floor and treatment Yes No emotional/communication barriers or cognitive limitation. Patient is motivated to learn. Patient understands and agrees with treatment goals and plan. PT explains patient will be examined in standing, sitting, and lying down to see how their muscles and joints work. When they are ready, they will be asked to remove their underwear so PT can examine their perineum. The patient is also given the option of providing their own chaperone as one is not provided in our facility. The patient also has the right and is explained the right to defer or refuse any part of the evaluation or treatment including the internal exam. With the patient's consent, PT will use one gloved finger to gently assess the muscles of the pelvic floor, seeing how well it contracts and relaxes and if there is muscle symmetry. After, the patient will get dressed and PT and patient will discuss exam findings and plan of care. PT and patient discuss plan of care, schedule, attendance policy and HEP activities.   PELVIC MMT:   MMT eval 03/24/24 04/14/24 04/26/24 05/17/24  Vaginal 2/5 5/5  5/5 5/5  Internal Anal Sphincter Not able to assess due to pain in the rectum with finger in  4/5 5/5   External Anal Sphincter Not able to assess due to pain in the rectum with finger in  4/5 5/5   Puborectalis Not able to assess due to pain in the rectum with finger in  4/5 5/5   Diastasis Recti none  4/5 5/5   (Blank rows = not tested)        TONE: Average tone  PROLAPSE: Not able to assess due to small vaginal opening  TODAY'S TREATMENT: 05/26/24 Exercises: Stretches/mobility: Hamstring stretch with strap holding 30 sec each Strengthening: Bridge with bilateral shoulder flexion holding 1 # wt in each hand 5 times but stopped due to right hamstring cramp Dead bug holding 1 #  in each hand 2 x 10 Supine press ball into knee and other hand chest presses 1 # Supine press ball into knee and other arm does horizontal  shoulder abduction holding 1 # wt.  Standing holding green band anti rotational  20 x each side Hold one arm in extension and flex opposite hip 20 x each side in standing     05/17/24 Manual: Internal pelvic floor techniques: No emotional/communication barriers or cognitive limitation. Patient is motivated to learn. Patient understands and agrees with treatment goals and plan. PT explains patient will be examined in standing, sitting, and lying down to see how their muscles and joints work. When they are ready, they will be asked to remove their underwear so PT can examine their perineum. The patient is also given the option of providing their own chaperone as one is not provided in our facility. The patient also has the right and is explained the right to defer or refuse any part of the evaluation or treatment including the internal exam. With the patient's consent, PT will use one gloved finger to gently assess the muscles of the pelvic floor, seeing how well it contracts and relaxes and if there is muscle symmetry. After, the patient will get dressed and PT and patient will discuss exam findings and plan of care. PT and patient discuss plan of care, schedule, attendance policy and HEP activities.  Therapist gloved finger in the vaginal canal working on the levator ani and obturator internist to elongate the tissue  Exercises: Stretches/mobility: Supine rock knees back and forth 10 x  Single knee to chest holding 30 sec 2 x  Supine hamstring stretch holding 30 sec 2 x  Supine ITB with strap holding 30 sec bil.     05/10/24 Neuromuscular re-education: Down training: Sitting diaphragmatic breathing with her breathing device and able to bulge the abdomen and contracts the abdomen with breathing out Exercises: Stretches/mobility: Supine hamstring stretch with strap holding 30 sec each Supine hip adductor stretch with strap holding 30 sec each Pull knee across the trunk holding 30 sec  each Piriformis stretch with strap in supine holding 30 sec each Strengthening: Supine pull band to the side to work the obliques 20 x each with hips on disc and ball squeeze Supine bilateral shoulder extension with green band with bridge 20 x      PATIENT EDUCATION:  03/31/24 Education details: Access Code: [email or web address]; educated on how to have a bowel movement Person educated: Patient Education method: Programmer, Multimedia, Demonstration, Tactile cues, Verbal cues, and Handouts Education comprehension: verbalized understanding, returned demonstration, verbal cues required, tactile cues required, and needs further education  HOME EXERCISE PROGRAM: 03/31/24 Access Code: [email or web address] URL: [email or web address] Date: 03/31/2024 Prepared by: Channing Pereyra  Exercises - Hooklying Transversus Abdominis Palpation  - 1 x daily -  7 x weekly - 1 sets - 10 reps - Supine Bridge  - 1 x daily - 7 x weekly - 1 sets - 10 reps - Seated Single Arm Shoulder Flexion with Dumbbell  - 1 x daily - 7 x weekly - 2 sets - 10 reps - Supine March  - 1 x daily - 3 x weekly - 2 sets - 10 reps - Hooklying Anti-Rotation Press With Anchored Resistance  - 1 x daily - 1 x weekly - 2 sets - 10 reps  Patient Education - Abdominal Massage for Constipation - Abdominal Massage for Constipation  ASSESSMENT:  CLINICAL IMPRESSION: Patient is a 64 y.o. female who was seen today for physical therapy treatment for constipation.  Patient is on the 6th dilator. She is able to cough without leaking urine. Sneezing will not leak urine most of the time. Having a bowel movement every 2-3 days. She uses her bowel movement techniques. She is not having to strain with a bowel movement. Patient will benefit from skilled therapy to improve pelvic floor coordination and reduce pain and urinary leakage.   OBJECTIVE IMPAIRMENTS: decreased activity tolerance, decreased coordination, decreased strength, increased fascial restrictions, increased  muscle spasms, and pain.   ACTIVITY LIMITATIONS: bending, continence, and toileting  PARTICIPATION LIMITATIONS: shopping and community activity  PERSONAL FACTORS: 1-2 comorbidities: Abdominal hernia repair; Abdominal hysterectomy; cesarean section;  are also affecting patient's functional outcome.   REHAB POTENTIAL: Good  CLINICAL DECISION MAKING: Evolving/moderate complexity  EVALUATION COMPLEXITY: Moderate   GOALS: Goals reviewed with patient? Yes  SHORT TERM GOALS: Target date: 02/17/24  Patient independent with initial HEP for core and pelvic floor strength.  Baseline: Goal status: Met 02/18/24  2.  Patient educated on how to have a bowel movement with correct breathing to relax the pelvic floor and generate force to push the stool out.  Baseline:  Goal status: Met 02/18/24  3.  Patient educated on vaginal moisturizers to improve the health of the tissue due to the Sjogren's and lack of estrogen.  Baseline:  Goal status: Met 02/09/24   LONG TERM GOALS: Target date: 07/07/24  Patient independent with advanced HEP for core and pelvic floor strength to improve continence.  Baseline:  Goal status: ongoing 04/23/24  2.  Patient is able to quickly contract her pelvic floor to stop urinary leakage when she sneezes, and laugh.  Baseline: most of the time no urinary leakage Goal status: Ongoing 05/26/24  3.  Patient is able to bend over with a full bladder and engage the core and pelvic floor correctly to prevent urine from leaking.  Baseline: patient reports it is 80% better  Goal status: ongoing 04/23/24  4.  Patient is able to place the largest dilator into the vaginal canal so she is able to have vaginal penetration and vaginal exam.  Baseline: on the 5th dilator Goal status: ongoing 04/23/24    PLAN:  PT FREQUENCY: 1-2x/week  PT DURATION: 12 weeks  PLANNED INTERVENTIONS: 97110-Therapeutic exercises, 97530- Therapeutic activity, 97112- Neuromuscular re-education,  97535- Self Care, [id number]- Manual therapy, [id number]- Electrical stimulation (unattended), 20560 (1-2 muscles), 20561 (3+ muscles)- Dry Needling, Patient/Family education, Cryotherapy, Moist heat, and Biofeedback  PLAN FOR NEXT SESSION:    core engagement;work on squatting with leaning forward; work on core exercises as above  Channing Pereyra, PT 05/26/24 3:29 PM   "

## 2024-06-02 ENCOUNTER — Ambulatory Visit

## 2024-06-02 DIAGNOSIS — R252 Cramp and spasm: Secondary | ICD-10-CM

## 2024-06-02 DIAGNOSIS — M6281 Muscle weakness (generalized): Secondary | ICD-10-CM

## 2024-06-02 DIAGNOSIS — R102 Pelvic and perineal pain unspecified side: Secondary | ICD-10-CM

## 2024-06-02 NOTE — Therapy (Signed)
 " OUTPATIENT PHYSICAL THERAPY FEMALE PELVIC TREATMENT   Patient Name: Kristine Curtis MRN: [id number] DOB:01/14/1961, 64 y.o., female Today's Date: 06/02/2024     END OF SESSION:  PT End of Session - 06/02/24 1447     Visit Number 16    Date for Recertification  07/07/24    Authorization Type [employer] medicare    Authorization Time Period 04/15/24-06/10/2024    Authorization - Visit Number 7    Authorization - Number of Visits 8    Progress Note Due on Visit 20    PT Start Time 1447    PT Stop Time 1535    PT Time Calculation (min) 48 min    Activity Tolerance Patient tolerated treatment well    Behavior During Therapy [facility] for tasks assessed/performed          Past Medical History:  Diagnosis Date   Allergic rhinitis due to pollen    Anemia    Anxiety    Asthma    Carpal tunnel syndrome of right wrist 12/22/2017   Cataract    Chronic combined systolic and diastolic CHF (congestive heart failure) (HCC)    06/2016 Echo: EF 40-45%, Gr1 DD   Chronic fatigue    Depression    Fall    Family history of polycystic kidney with negative CT 11/16/2012   Fibromyalgia    GERD (gastroesophageal reflux disease)    Helicobacter pylori gastritis 05/2020   Hypertension    Hypothyroidism    IBS (irritable bowel syndrome)    Internal hemorrhoids    Lymphocytic colitis    Morbid obesity (HCC)    NICM (nonischemic cardiomyopathy) (HCC)    a. 1999 Cath: nl cors;  b. EF prev as low as 35%;  c. 11/2015 Echo: Ef 40-45%;  d. 06/2016 Echo: EF 40-45%;  e. Lexiscan  MV: fixed anterosepta/inferseptal defect w/o ischemia, EF 35%.   Osteoarthritis    PONV (postoperative nausea and vomiting)    PVC (premature ventricular contraction)    Restless leg syndrome    Sjogren's syndrome    Vertigo    Past Surgical History:  Procedure Laterality Date   ABDOMINAL HERNIA REPAIR     ABDOMINAL HYSTERECTOMY     CESAREAN SECTION     CHOLECYSTECTOMY  10/2008   COLONOSCOPY  02/14/2015    ESOPHAGOGASTRODUODENOSCOPY     EYE SURGERY     2 2013 and 1981/DCR of right eye 05/2014   LUMBAR DISC SURGERY  2016   L3-4   RIGHT/LEFT HEART CATH AND CORONARY ANGIOGRAPHY N/A 07/11/2016   Procedure: Right/Left Heart Cath and Coronary Angiography;  Surgeon: Peter M Jordan, MD;  Location: [facility] INVASIVE CV LAB;  Service: Cardiovascular;  Laterality: N/A;   SHOULDER ARTHROSCOPY W/ SUBACROMIAL DECOMPRESSION AND DISTAL CLAVICLE EXCISION Right    SPINE SURGERY     C6-C7, 03/2017   TUBAL LIGATION  1992   Patient Active Problem List   Diagnosis Date Noted   Constipation 11/21/2023   Helicobacter pylori gastritis 05/15/2021   Hormone replacement therapy (HRT) 07/29/2019   IBS (irritable bowel syndrome) 11/10/2017   B12 deficiency 11/10/2017   OSA (obstructive sleep apnea) 11/10/2017   Myalgia due to statin 11/10/2017   Lumbar disc disease 10/01/2017   RLS (restless legs syndrome) 10/01/2017   Myofascial pain dysfunction syndrome 07/14/2017   Cervical radiculopathy 07/14/2017   Degenerative arthritis of left knee, XRAY12/2018 07/13/2017   Morbid obesity (HCC) 02/24/2017   Sjogren's disease 02/24/2017   Fibromyalgia 01/21/2017   Chronic low back pain  with sciatica 05/02/2015   Nonischemic cardiomyopathy (HCC) 03/22/2015   Abdominal pain 11/05/2013   Vitamin D  deficiency 10/09/2009   Depression with anxiety 08/31/2008   Essential hypertension 08/31/2008   Allergic rhinitis 08/31/2008   Moderate persistent asthma 08/31/2008   Esophageal reflux 08/31/2008   Acquired hypothyroidism 08/30/2008   Classical migraine without intractable migraine 08/30/2008    PCP: Hughie Sharper, MD  REFERRING PROVIDER: Cayetano Harlene BIRCH, PA-C   REFERRING DIAG: K59.00 (ICD-10-CM) - Constipation, unspecified constipation type   THERAPY DIAG:  Muscle weakness (generalized)  Cramp and spasm  Pelvic pain  Rationale for Evaluation and Treatment: Rehabilitation  ONSET DATE: 10/12/23  SUBJECTIVE:                                                                                                                                                                                            SUBJECTIVE STATEMENT: Patient states everything hurts with weather, I have been on heat and ice and take oxy a few times due to the pain; states none of the exercises or stretches have helped, states whenever she would try to move everything would cramp up - felt like the cramp was underneath the muscle.    FUNCTIONAL LIMITATIONS: none  PERTINENT HISTORY:  Medications for current condition: Estradiol; Linzess ;  Surgeries: Abdominal hernia repair; Abdominal hysterectomy; cesarean section;  Other: Chronic fatigue; Fibromyalgia; hypothyroidism; IBS; Lymphocytic colitis; Sjogren's syndrome; Cholecystectomy; Lumbar disc surgery;  Sexual abuse: No  PAIN:  Are you having pain? No  PRECAUTIONS: None  RED FLAGS: None   WEIGHT BEARING RESTRICTIONS: No  FALLS:  Has patient fallen in last 6 months? Yes. Number of falls 1 off her step but not due to balance  OCCUPATION: disability  ACTIVITY LEVEL : active, does weights, working on walking, flexibility exercise  PLOF: Independent  PATIENT GOALS: reduce leakage, improve constipation, improve pelvic floor health   BOWEL MOVEMENT: Pain with bowel movement: No, since she has been on the Linzess  Type of bowel movement:Type (Bristol Stool Scale) Type 5 and 6 with Linzess , Frequency 1-3 days, Strain no , and Splinting no Fully empty rectum: Yes: with linzess  Leakage: No                                                     Fiber supplement/laxative No  URINATION: Pain with urination: No Fully empty bladder: No; has to lean forward and move around and make a conscious effort  Post-void dribble: when she does not take her time Stream: Strong at first then is weak Urgency: Yes  Frequency:during the day 10-12                                Nocturia: Yes: 1     Leakage: Coughing, Sneezing, Laughing, and hold urine too long and bend over Pads/briefs: Yes: if she is going to be someplace without a bathroom then wears a pad  INTERCOURSE:  Ability to have vaginal penetration No  pain level was 10/10 last time she tried so has stopped.  Pain with intercourse: Initial Penetration, During Penetration, and Deep Penetration Dryness: Yes , stopped intercourse due to vaginal dryness Marinoff Scale: 3/3 Lubricant:yes  PREGNANCY: C-section deliveries 2   PROLAPSE: Pressure and Bulge   OBJECTIVE:  Note: Objective measures were completed at Evaluation unless otherwise noted.  DIAGNOSTIC FINDINGS:  none  PATIENT SURVEYS:  PFIQ-7: 90 UIQ-7: 43 POPIQ-7; 48 03/31/24: PFIQ-7: 67 UIQ-7: 38 POPIQ-7; 29  COGNITION: Overall cognitive status: Within functional limits for tasks assessed     SENSATION: Light touch: Appears intact    FUNCTIONAL TESTS:  Single leg stance:  Rt:3 sec  Lt:2 sec Squat:able to squat   POSTURE: rounded shoulders, forward head, increased thoracic kyphosis, and posterior pelvic tilt   LUMBARAROM/PROM:  A/PROM A/PROM  Eval (% available) 03/31/24 (% available)  Flexion full full  Extension Decreased by 50% full  Right lateral flexion Decreased by 25% full  Left lateral flexion Decreased by 25% 75  Right rotation Decreased by 25% full  Left rotation Decreased by 25% full   (Blank rows = not tested)  LOWER EXTREMITY ROM:  Passive ROM Right eval Left eval Right/left 03/31/24  Hip internal rotation 0 0 10   (Blank rows = not tested)  PALPATION:   Pelvic Alignment: left ASIS is higher  Abdominal: rib cage angle greater than 90 degrees; lifts rib cage to contract abdominal  Diastasis: No Distortion: Yes , increased upper abdominal girth compared to lower  Breathing: chest breather Scar tissue: Yes: cesarean restricted                External Perineal Exam: introitus was small, pink coloring                              Internal Pelvic Floor: tenderness throughout the vaginal canal not able to assess the pelvic floor rectally  Patient confirms identification and approves PT to assess internal pelvic floor and treatment Yes No emotional/communication barriers or cognitive limitation. Patient is motivated to learn. Patient understands and agrees with treatment goals and plan. PT explains patient will be examined in standing, sitting, and lying down to see how their muscles and joints work. When they are ready, they will be asked to remove their underwear so PT can examine their perineum. The patient is also given the option of providing their own chaperone as one is not provided in our facility. The patient also has the right and is explained the right to defer or refuse any part of the evaluation or treatment including the internal exam. With the patient's consent, PT will use one gloved finger to gently assess the muscles of the pelvic floor, seeing how well it contracts and relaxes and if there is muscle symmetry. After, the patient will get dressed and PT and patient will discuss exam findings  and plan of care. PT and patient discuss plan of care, schedule, attendance policy and HEP activities.   PELVIC MMT:   MMT eval 03/24/24 04/14/24 04/26/24 05/17/24  Vaginal 2/5 5/5  5/5 5/5  Internal Anal Sphincter Not able to assess due to pain in the rectum with finger in  4/5 5/5   External Anal Sphincter Not able to assess due to pain in the rectum with finger in  4/5 5/5   Puborectalis Not able to assess due to pain in the rectum with finger in  4/5 5/5   Diastasis Recti none  4/5 5/5   (Blank rows = not tested)        TONE: Average tone  PROLAPSE: Not able to assess due to small vaginal opening   [facility] Adult PT Treatment:                                                DATE: 06/02/2024 Therapeutic Exercise: Hooklying clamshell x10 each leg  Hookying bilateral clamshell x10 Supine hip add  stretch (butterfly) + 10 deep breaths Neuromuscular re-ed: Bent knee fall out + TA activation x10 each leg  Double leg clamshell + TA activation 2x5 Heel slide + TA activation --> tactile cues to decrease glute gripping/low back compensation Modified unilateral 90/90 toe tap + TA activation Bridges + PF activation    TODAY'S TREATMENT: 05/26/24 Exercises: Stretches/mobility: Hamstring stretch with strap holding 30 sec each Strengthening: Bridge with bilateral shoulder flexion holding 1 # wt in each hand 5 times but stopped due to right hamstring cramp Dead bug holding 1 #  in each hand 2 x 10 Supine press ball into knee and other hand chest presses 1 # Supine press ball into knee and other arm does horizontal shoulder abduction holding 1 # wt.  Standing holding green band anti rotational  20 x each side Hold one arm in extension and flex opposite hip 20 x each side in standing    05/17/24 Manual: Internal pelvic floor techniques: No emotional/communication barriers or cognitive limitation. Patient is motivated to learn. Patient understands and agrees with treatment goals and plan. PT explains patient will be examined in standing, sitting, and lying down to see how their muscles and joints work. When they are ready, they will be asked to remove their underwear so PT can examine their perineum. The patient is also given the option of providing their own chaperone as one is not provided in our facility. The patient also has the right and is explained the right to defer or refuse any part of the evaluation or treatment including the internal exam. With the patient's consent, PT will use one gloved finger to gently assess the muscles of the pelvic floor, seeing how well it contracts and relaxes and if there is muscle symmetry. After, the patient will get dressed and PT and patient will discuss exam findings and plan of care. PT and patient discuss plan of care, schedule, attendance policy and HEP  activities.  Therapist gloved finger in the vaginal canal working on the levator ani and obturator internist to elongate the tissue  Exercises: Stretches/mobility: Supine rock knees back and forth 10 x  Single knee to chest holding 30 sec 2 x  Supine hamstring stretch holding 30 sec 2 x  Supine ITB with strap holding 30 sec bil.  05/10/24 Neuromuscular re-education: Down training: Sitting diaphragmatic breathing with her breathing device and able to bulge the abdomen and contracts the abdomen with breathing out Exercises: Stretches/mobility: Supine hamstring stretch with strap holding 30 sec each Supine hip adductor stretch with strap holding 30 sec each Pull knee across the trunk holding 30 sec each Piriformis stretch with strap in supine holding 30 sec each Strengthening: Supine pull band to the side to work the obliques 20 x each with hips on disc and ball squeeze Supine bilateral shoulder extension with green band with bridge 20 x      PATIENT EDUCATION:  03/31/24 Education details: Updated HEP Person educated: Patient Education method: Programmer, Multimedia, Demonstration, Actor cues, Verbal cues, and Handouts Education comprehension: verbalized understanding, returned demonstration, verbal cues required, tactile cues required, and needs further education  HOME EXERCISE PROGRAM: 06/02/24 Access Code: [email or web address] URL: [email or web address] Date: 06/02/2024 Prepared by: Lamarr Price  Exercises - Hooklying Transversus Abdominis Palpation  - 1 x daily - 7 x weekly - 1 sets - 10 reps - Supine Bridge  - 1 x daily - 7 x weekly - 1 sets - 10 reps - Seated Single Arm Shoulder Flexion with Dumbbell  - 1 x daily - 7 x weekly - 2 sets - 10 reps - Supine March  - 1 x daily - 3 x weekly - 2 sets - 10 reps - Hooklying Anti-Rotation Press With Anchored Resistance  - 1 x daily - 1 x weekly - 2 sets - 10 reps - Bent Knee Fallouts  - 1 x daily - 7 x weekly - 3 sets - 10 reps -  Supine Transversus Abdominis Bracing with Double Leg Fallout  - 1 x daily - 7 x weekly - 1 sets - 10 reps - Supine Transversus Abdominis Bracing with Heel Slide  - 1 x daily - 7 x weekly - 1 sets - 10 reps  Patient Education - Abdominal Massage for Constipation - Abdominal Massage for Constipation  ASSESSMENT:  CLINICAL IMPRESSION: Noted glute gripping and low back compensation with supine core-focused exercises; tactile and verbal cueing alleviated low back and glute gripping compensation during supine exercises. Moderate cueing required throughout session to maintain proper transverse abdominis activation without low back compensation.    OBJECTIVE IMPAIRMENTS: decreased activity tolerance, decreased coordination, decreased strength, increased fascial restrictions, increased muscle spasms, and pain.   ACTIVITY LIMITATIONS: bending, continence, and toileting  PARTICIPATION LIMITATIONS: shopping and community activity  PERSONAL FACTORS: 1-2 comorbidities: Abdominal hernia repair; Abdominal hysterectomy; cesarean section;  are also affecting patient's functional outcome.   REHAB POTENTIAL: Good  CLINICAL DECISION MAKING: Evolving/moderate complexity  EVALUATION COMPLEXITY: Moderate   GOALS: Goals reviewed with patient? Yes  SHORT TERM GOALS: Target date: 02/17/24  Patient independent with initial HEP for core and pelvic floor strength.  Baseline: Goal status: Met 02/18/24  2.  Patient educated on how to have a bowel movement with correct breathing to relax the pelvic floor and generate force to push the stool out.  Baseline:  Goal status: Met 02/18/24  3.  Patient educated on vaginal moisturizers to improve the health of the tissue due to the Sjogren's and lack of estrogen.  Baseline:  Goal status: Met 02/09/24   LONG TERM GOALS: Target date: 07/07/24  Patient independent with advanced HEP for core and pelvic floor strength to improve continence.  Baseline:  Goal status:  ongoing 04/23/24  2.  Patient is able to quickly contract her pelvic floor to stop urinary leakage when she sneezes,  and laugh.  Baseline: most of the time no urinary leakage Goal status: Ongoing 05/26/24  3.  Patient is able to bend over with a full bladder and engage the core and pelvic floor correctly to prevent urine from leaking.  Baseline: patient reports it is 80% better  Goal status: ongoing 04/23/24  4.  Patient is able to place the largest dilator into the vaginal canal so she is able to have vaginal penetration and vaginal exam.  Baseline: on the 5th dilator Goal status: ongoing 04/23/24    PLAN:  PT FREQUENCY: 1-2x/week  PT DURATION: 12 weeks  PLANNED INTERVENTIONS: 97110-Therapeutic exercises, 97530- Therapeutic activity, 97112- Neuromuscular re-education, 97535- Self Care, [id number]- Manual therapy, [id number]- Electrical stimulation (unattended), 20560 (1-2 muscles), 20561 (3+ muscles)- Dry Needling, Patient/Family education, Cryotherapy, Moist heat, and Biofeedback  PLAN FOR NEXT SESSION:  core engagement;work on squatting with leaning forward; work on core exercises as above  Lamarr Price, PTA 06/02/24 4:57 PM   "

## 2024-06-07 ENCOUNTER — Ambulatory Visit: Payer: Self-pay | Admitting: Physical Therapy

## 2024-06-09 ENCOUNTER — Ambulatory Visit: Admitting: Physical Therapy

## 2024-06-09 ENCOUNTER — Ambulatory Visit: Payer: Self-pay | Admitting: Physical Therapy

## 2024-06-09 ENCOUNTER — Encounter: Payer: Self-pay | Admitting: Physical Therapy

## 2024-06-09 DIAGNOSIS — M6281 Muscle weakness (generalized): Secondary | ICD-10-CM

## 2024-06-09 DIAGNOSIS — R252 Cramp and spasm: Secondary | ICD-10-CM

## 2024-06-09 DIAGNOSIS — R102 Pelvic and perineal pain unspecified side: Secondary | ICD-10-CM

## 2024-06-09 NOTE — Therapy (Signed)
 " OUTPATIENT PHYSICAL THERAPY FEMALE PELVIC TREATMENT   Patient Name: Kristine Curtis MRN: [id number] DOB:14-Jun-1960, 64 y.o., female Today's Date: 06/09/2024     END OF SESSION:  PT End of Session - 06/09/24 1150     Visit Number 17    Date for Recertification  07/07/24    Authorization Type [employer] medicare    Authorization Time Period 04/15/24-06/10/2024    Authorization - Visit Number 8    Authorization - Number of Visits 8    Progress Note Due on Visit 20    PT Start Time 1145    PT Stop Time 1225    PT Time Calculation (min) 40 min    Activity Tolerance Patient tolerated treatment well    Behavior During Therapy [facility] for tasks assessed/performed          Past Medical History:  Diagnosis Date   Allergic rhinitis due to pollen    Anemia    Anxiety    Asthma    Carpal tunnel syndrome of right wrist 12/22/2017   Cataract    Chronic combined systolic and diastolic CHF (congestive heart failure) (HCC)    06/2016 Echo: EF 40-45%, Gr1 DD   Chronic fatigue    Depression    Fall    Family history of polycystic kidney with negative CT 11/16/2012   Fibromyalgia    GERD (gastroesophageal reflux disease)    Helicobacter pylori gastritis 05/2020   Hypertension    Hypothyroidism    IBS (irritable bowel syndrome)    Internal hemorrhoids    Lymphocytic colitis    Morbid obesity (HCC)    NICM (nonischemic cardiomyopathy) (HCC)    a. 1999 Cath: nl cors;  b. EF prev as low as 35%;  c. 11/2015 Echo: Ef 40-45%;  d. 06/2016 Echo: EF 40-45%;  e. Lexiscan  MV: fixed anterosepta/inferseptal defect w/o ischemia, EF 35%.   Osteoarthritis    PONV (postoperative nausea and vomiting)    PVC (premature ventricular contraction)    Restless leg syndrome    Sjogren's syndrome    Vertigo    Past Surgical History:  Procedure Laterality Date   ABDOMINAL HERNIA REPAIR     ABDOMINAL HYSTERECTOMY     CESAREAN SECTION     CHOLECYSTECTOMY  10/2008   COLONOSCOPY  02/14/2015    ESOPHAGOGASTRODUODENOSCOPY     EYE SURGERY     2 2013 and 1981/DCR of right eye 05/2014   LUMBAR DISC SURGERY  2016   L3-4   RIGHT/LEFT HEART CATH AND CORONARY ANGIOGRAPHY N/A 07/11/2016   Procedure: Right/Left Heart Cath and Coronary Angiography;  Surgeon: Peter M Jordan, MD;  Location: [facility] INVASIVE CV LAB;  Service: Cardiovascular;  Laterality: N/A;   SHOULDER ARTHROSCOPY W/ SUBACROMIAL DECOMPRESSION AND DISTAL CLAVICLE EXCISION Right    SPINE SURGERY     C6-C7, 03/2017   TUBAL LIGATION  1992   Patient Active Problem List   Diagnosis Date Noted   Constipation 11/21/2023   Helicobacter pylori gastritis 05/15/2021   Hormone replacement therapy (HRT) 07/29/2019   IBS (irritable bowel syndrome) 11/10/2017   B12 deficiency 11/10/2017   OSA (obstructive sleep apnea) 11/10/2017   Myalgia due to statin 11/10/2017   Lumbar disc disease 10/01/2017   RLS (restless legs syndrome) 10/01/2017   Myofascial pain dysfunction syndrome 07/14/2017   Cervical radiculopathy 07/14/2017   Degenerative arthritis of left knee, XRAY12/2018 07/13/2017   Morbid obesity (HCC) 02/24/2017   Sjogren's disease 02/24/2017   Fibromyalgia 01/21/2017   Chronic low back pain  with sciatica 05/02/2015   Nonischemic cardiomyopathy (HCC) 03/22/2015   Abdominal pain 11/05/2013   Vitamin D  deficiency 10/09/2009   Depression with anxiety 08/31/2008   Essential hypertension 08/31/2008   Allergic rhinitis 08/31/2008   Moderate persistent asthma 08/31/2008   Esophageal reflux 08/31/2008   Acquired hypothyroidism 08/30/2008   Classical migraine without intractable migraine 08/30/2008    PCP: Hughie Sharper, MD  REFERRING PROVIDER: Cayetano Harlene BIRCH, PA-C   REFERRING DIAG: K59.00 (ICD-10-CM) - Constipation, unspecified constipation type   THERAPY DIAG:  Muscle weakness (generalized)  Cramp and spasm  Pelvic pain  Rationale for Evaluation and Treatment: Rehabilitation  ONSET DATE: 10/12/23  SUBJECTIVE:                                                                                                                                                                                            SUBJECTIVE STATEMENT: I have had urinary leakage multiple times since last visit. I have stopped drinking my cola. I feel on the ice. I have not fractured anything.    FUNCTIONAL LIMITATIONS: none  PERTINENT HISTORY:  Medications for current condition: Estradiol; Linzess ;  Surgeries: Abdominal hernia repair; Abdominal hysterectomy; cesarean section;  Other: Chronic fatigue; Fibromyalgia; hypothyroidism; IBS; Lymphocytic colitis; Sjogren's syndrome; Cholecystectomy; Lumbar disc surgery;  Sexual abuse: No  PAIN:  Are you having pain? No  PRECAUTIONS: None  RED FLAGS: None   WEIGHT BEARING RESTRICTIONS: No  FALLS:  Has patient fallen in last 6 months? Yes. Number of falls 1 off her step but not due to balance  OCCUPATION: disability  ACTIVITY LEVEL : active, does weights, working on walking, flexibility exercise  PLOF: Independent  PATIENT GOALS: reduce leakage, improve constipation, improve pelvic floor health   BOWEL MOVEMENT: Pain with bowel movement: No, since she has been on the Linzess  Type of bowel movement:Type (Bristol Stool Scale) Type 5 and 6 with Linzess , Frequency 1-3 days, Strain no , and Splinting no Fully empty rectum: Yes: with linzess  Leakage: No                                                     Fiber supplement/laxative No  URINATION: Pain with urination: No Fully empty bladder: No; has to lean forward and move around and make a conscious effort                Post-void dribble: when she does not take her time Stream: Strong at first then is weak Urgency:  Yes  Frequency:during the day 10-12                                Nocturia: Yes: 1    Leakage: Coughing, Sneezing, Laughing, and hold urine too long and bend over 06/09/24: coughing, sneezing, laughing, bending over to pick up  items.  Pads/briefs: Yes: if she is going to be someplace without a bathroom then wears a pad  INTERCOURSE:  Ability to have vaginal penetration No  pain level was 10/10 last time she tried so has stopped.  Pain with intercourse: Initial Penetration, During Penetration, and Deep Penetration Dryness: Yes , stopped intercourse due to vaginal dryness Marinoff Scale: 3/3 Lubricant:yes  PREGNANCY: C-section deliveries 2   PROLAPSE: Pressure and Bulge   OBJECTIVE:  Note: Objective measures were completed at Evaluation unless otherwise noted.  DIAGNOSTIC FINDINGS:  none  PATIENT SURVEYS:  PFIQ-7: 90 UIQ-7: 43 POPIQ-7; 48 03/31/24: PFIQ-7: 67 UIQ-7: 38 POPIQ-7; 29 06/09/24: PFIQ-7: 86 UIQ-7: 38 POPIQ-7; 48   COGNITION: Overall cognitive status: Within functional limits for tasks assessed     SENSATION: Light touch: Appears intact    FUNCTIONAL TESTS:  Single leg stance:  Rt:3 sec  Lt:2 sec Squat:able to squat   POSTURE: rounded shoulders, forward head, increased thoracic kyphosis, and posterior pelvic tilt   LUMBARAROM/PROM:  A/PROM A/PROM  Eval (% available) 03/31/24 (% available)  Flexion full full  Extension Decreased by 50% full  Right lateral flexion Decreased by 25% full  Left lateral flexion Decreased by 25% 75  Right rotation Decreased by 25% full  Left rotation Decreased by 25% full   (Blank rows = not tested)  LOWER EXTREMITY ROM:  Passive ROM Right eval Left eval Right/left 03/31/24  Hip internal rotation 0 0 10   (Blank rows = not tested)  PALPATION:   Pelvic Alignment: left ASIS is higher  Abdominal: rib cage angle greater than 90 degrees; lifts rib cage to contract abdominal  Diastasis: No Distortion: Yes , increased upper abdominal girth compared to lower  Breathing: chest breather Scar tissue: Yes: cesarean restricted                External Perineal Exam: introitus was small, pink coloring                              Internal Pelvic Floor: tenderness throughout the vaginal canal not able to assess the pelvic floor rectally  Patient confirms identification and approves PT to assess internal pelvic floor and treatment Yes No emotional/communication barriers or cognitive limitation. Patient is motivated to learn. Patient understands and agrees with treatment goals and plan. PT explains patient will be examined in standing, sitting, and lying down to see how their muscles and joints work. When they are ready, they will be asked to remove their underwear so PT can examine their perineum. The patient is also given the option of providing their own chaperone as one is not provided in our facility. The patient also has the right and is explained the right to defer or refuse any part of the evaluation or treatment including the internal exam. With the patient's consent, PT will use one gloved finger to gently assess the muscles of the pelvic floor, seeing how well it contracts and relaxes and if there is muscle symmetry. After, the patient will get dressed and PT and patient will discuss  exam findings and plan of care. PT and patient discuss plan of care, schedule, attendance policy and HEP activities.   PELVIC MMT:   MMT eval 03/24/24 04/14/24 04/26/24 05/17/24  Vaginal 2/5 5/5  5/5 5/5  Internal Anal Sphincter Not able to assess due to pain in the rectum with finger in  4/5 5/5   External Anal Sphincter Not able to assess due to pain in the rectum with finger in  4/5 5/5   Puborectalis Not able to assess due to pain in the rectum with finger in  4/5 5/5   Diastasis Recti none  4/5 5/5   (Blank rows = not tested)        TONE: Average tone  PROLAPSE: Not able to assess due to small vaginal opening [facility] Adult PT Treatment:                                                DATE: 06/09/2024 Exercises: Strengthening: Hooklying clamshell x10 each leg  Hookying bilateral clamshell 2 x 10 with 3 dot band Bridge with knees  out with 3 dot band around knees 2 x 10  Quadruped lift opposite extremity with 3 dot band around the knees x 10 Dead lift with holding 5 # each hand 15 x  Diagonal dead lift with 5# wt going to the forward foot 10 x each side Mini squat with holding 5 # each hand 10 x      [facility] Adult PT Treatment:                                                DATE: 06/02/2024 Therapeutic Exercise: Hooklying clamshell x10 each leg  Hookying bilateral clamshell x10 Supine hip add stretch (butterfly) + 10 deep breaths Neuromuscular re-ed: Bent knee fall out + TA activation x10 each leg  Double leg clamshell + TA activation 2x5 Heel slide + TA activation --> tactile cues to decrease glute gripping/low back compensation Modified unilateral 90/90 toe tap + TA activation Bridges + PF activation    TODAY'S TREATMENT: 05/26/24 Exercises: Stretches/mobility: Hamstring stretch with strap holding 30 sec each Strengthening: Bridge with bilateral shoulder flexion holding 1 # wt in each hand 5 times but stopped due to right hamstring cramp Dead bug holding 1 #  in each hand 2 x 10 Supine press ball into knee and other hand chest presses 1 # Supine press ball into knee and other arm does horizontal shoulder abduction holding 1 # wt.  Standing holding green band anti rotational  20 x each side Hold one arm in extension and flex opposite hip 20 x each side in standing       PATIENT EDUCATION:  03/31/24 Education details: Updated HEP Person educated: Patient Education method: Programmer, Multimedia, Demonstration, Tactile cues, Verbal cues, and Handouts Education comprehension: verbalized understanding, returned demonstration, verbal cues required, tactile cues required, and needs further education  HOME EXERCISE PROGRAM: 06/02/24 Access Code: [email or web address] URL: [email or web address] Date: 06/02/2024 Prepared by: Lamarr Price  Exercises - Hooklying Transversus Abdominis Palpation  - 1 x daily - 7 x  weekly - 1 sets - 10 reps - Supine Bridge  - 1 x daily - 7 x weekly - 1 sets - 10 reps -  Seated Single Arm Shoulder Flexion with Dumbbell  - 1 x daily - 7 x weekly - 2 sets - 10 reps - Supine March  - 1 x daily - 3 x weekly - 2 sets - 10 reps - Hooklying Anti-Rotation Press With Anchored Resistance  - 1 x daily - 1 x weekly - 2 sets - 10 reps - Bent Knee Fallouts  - 1 x daily - 7 x weekly - 3 sets - 10 reps - Supine Transversus Abdominis Bracing with Double Leg Fallout  - 1 x daily - 7 x weekly - 1 sets - 10 reps - Supine Transversus Abdominis Bracing with Heel Slide  - 1 x daily - 7 x weekly - 1 sets - 10 reps  Patient Education - Abdominal Massage for Constipation - Abdominal Massage for Constipation  ASSESSMENT:  CLINICAL IMPRESSION:  Patient is a 64 y.o. female who was seen today for physical therapy treatment for constipation.  Patient is on the 6th dilator.  Having a bowel movement every 2-3 days. She uses her bowel movement techniques. She is not having to strain with a bowel movement. Her Pelvic floor impact score has increased due to falling on the ice and not being able to exercise. Patient is now urinating every 2 hours instead of every hour. Pelvic floor strength is 5/5. Patient has been leaking urine coughing, sneezing, laughing, bending over to pick up items within the past week. Patient will benefit from skilled therapy to improve pelvic floor coordination and reduce pain and urinary leakage    OBJECTIVE IMPAIRMENTS: decreased activity tolerance, decreased coordination, decreased strength, increased fascial restrictions, increased muscle spasms, and pain.   ACTIVITY LIMITATIONS: bending, continence, and toileting  PARTICIPATION LIMITATIONS: shopping and community activity  PERSONAL FACTORS: 1-2 comorbidities: Abdominal hernia repair; Abdominal hysterectomy; cesarean section;  are also affecting patient's functional outcome.   REHAB POTENTIAL: Good  CLINICAL DECISION  MAKING: Evolving/moderate complexity  EVALUATION COMPLEXITY: Moderate   GOALS: Goals reviewed with patient? Yes  SHORT TERM GOALS: Target date: 02/17/24  Patient independent with initial HEP for core and pelvic floor strength.  Baseline: Goal status: Met 02/18/24  2.  Patient educated on how to have a bowel movement with correct breathing to relax the pelvic floor and generate force to push the stool out.  Baseline:  Goal status: Met 02/18/24  3.  Patient educated on vaginal moisturizers to improve the health of the tissue due to the Sjogren's and lack of estrogen.  Baseline:  Goal status: Met 02/09/24   LONG TERM GOALS: Target date: 07/07/24  Patient independent with advanced HEP for core and pelvic floor strength to improve continence.  Baseline:  Goal status: ongoing 04/23/24  2.  Patient is able to quickly contract her pelvic floor to stop urinary leakage when she sneezes, and laugh.  Baseline: most of the time no urinary leakage Goal status: Ongoing 05/26/24  3.  Patient is able to bend over with a full bladder and engage the core and pelvic floor correctly to prevent urine from leaking.  Baseline: patient reports it is 80% better  Goal status: ongoing 04/23/24  4.  Patient is able to place the largest dilator into the vaginal canal so she is able to have vaginal penetration and vaginal exam.  Baseline: on the 5th dilator Goal status: ongoing 04/23/24    PLAN:  PT FREQUENCY: 1-2x/week  PT DURATION: 12 weeks  PLANNED INTERVENTIONS: 97110-Therapeutic exercises, 97530- Therapeutic activity, 97112- Neuromuscular re-education, 97535- Self Care, [id number]- Manual  therapy, G0283- Electrical stimulation (unattended), [phone] (1-2 muscles), 20561 (3+ muscles)- Dry Needling, Patient/Family education, Cryotherapy, Moist heat, and Biofeedback  PLAN FOR NEXT SESSION:  core engagement;work on squatting with leaning forward; work on core exercises as above  Channing Pereyra, PT 06/09/24  12:25 PM [facility] [address] [place], [place] [address] Phone # [phone] Fax [phone]    "

## 2024-06-14 ENCOUNTER — Ambulatory Visit: Payer: Self-pay | Admitting: Physical Therapy

## 2024-07-09 ENCOUNTER — Ambulatory Visit: Admitting: Physical Therapy

## 2024-07-14 ENCOUNTER — Ambulatory Visit: Admitting: Physical Therapy

## 2024-07-21 ENCOUNTER — Ambulatory Visit: Admitting: Physical Therapy
